# Patient Record
Sex: Female | Born: 1950
Health system: Southern US, Community
[De-identification: ages and names within clinical notes are randomized; demographics above are authoritative.]

## PROBLEM LIST (undated history)

## (undated) VITALS — BP 129/85 | HR 92 | Temp 98.1°F | Resp 18 | Ht 60.0 in | Wt 139.0 lb

## (undated) VITALS — BP 148/86 | HR 87 | Temp 97.0°F | Resp 17 | Ht 61.0 in

## (undated) DIAGNOSIS — I2510 Atherosclerotic heart disease of native coronary artery without angina pectoris: Secondary | ICD-10-CM

## (undated) DIAGNOSIS — Z720 Tobacco use: Secondary | ICD-10-CM

## (undated) DIAGNOSIS — Z8489 Family history of other specified conditions: Secondary | ICD-10-CM

## (undated) DIAGNOSIS — F329 Major depressive disorder, single episode, unspecified: Secondary | ICD-10-CM

## (undated) DIAGNOSIS — R413 Other amnesia: Secondary | ICD-10-CM

## (undated) DIAGNOSIS — F32A Depression, unspecified: Secondary | ICD-10-CM

## (undated) DIAGNOSIS — I639 Cerebral infarction, unspecified: Secondary | ICD-10-CM

## (undated) DIAGNOSIS — T8859XA Other complications of anesthesia, initial encounter: Secondary | ICD-10-CM

## (undated) DIAGNOSIS — G2401 Drug induced subacute dyskinesia: Secondary | ICD-10-CM

## (undated) DIAGNOSIS — T4145XA Adverse effect of unspecified anesthetic, initial encounter: Secondary | ICD-10-CM

## (undated) DIAGNOSIS — W19XXXA Unspecified fall, initial encounter: Secondary | ICD-10-CM

## (undated) DIAGNOSIS — R079 Chest pain, unspecified: Secondary | ICD-10-CM

## (undated) DIAGNOSIS — K219 Gastro-esophageal reflux disease without esophagitis: Secondary | ICD-10-CM

## (undated) DIAGNOSIS — F3131 Bipolar disorder, current episode depressed, mild: Secondary | ICD-10-CM

## (undated) DIAGNOSIS — I10 Essential (primary) hypertension: Secondary | ICD-10-CM

## (undated) HISTORY — PX: CARDIAC CATHETERIZATION: SHX172

## (undated) HISTORY — DX: Other amnesia: R41.3

## (undated) HISTORY — PX: ABDOMINAL HYSTERECTOMY: SHX81

## (undated) HISTORY — DX: Gastro-esophageal reflux disease without esophagitis: K21.9

---

## 1988-10-18 DIAGNOSIS — Z8672 Personal history of thrombophlebitis: Secondary | ICD-10-CM | POA: Insufficient documentation

## 1998-01-17 ENCOUNTER — Emergency Department (HOSPITAL_COMMUNITY): Admission: EM | Admit: 1998-01-17 | Discharge: 1998-01-17 | Payer: Self-pay | Admitting: Emergency Medicine

## 1998-08-27 ENCOUNTER — Emergency Department (HOSPITAL_COMMUNITY): Admission: EM | Admit: 1998-08-27 | Discharge: 1998-08-27 | Payer: Self-pay | Admitting: *Deleted

## 1998-08-27 ENCOUNTER — Encounter: Payer: Self-pay | Admitting: *Deleted

## 1998-08-28 ENCOUNTER — Emergency Department (HOSPITAL_COMMUNITY): Admission: EM | Admit: 1998-08-28 | Discharge: 1998-08-28 | Payer: Self-pay | Admitting: Emergency Medicine

## 1998-08-30 ENCOUNTER — Encounter: Payer: Self-pay | Admitting: Emergency Medicine

## 1998-08-30 ENCOUNTER — Emergency Department (HOSPITAL_COMMUNITY): Admission: EM | Admit: 1998-08-30 | Discharge: 1998-08-30 | Payer: Self-pay | Admitting: Emergency Medicine

## 1999-03-04 ENCOUNTER — Encounter: Payer: Self-pay | Admitting: Emergency Medicine

## 1999-03-04 ENCOUNTER — Emergency Department (HOSPITAL_COMMUNITY): Admission: EM | Admit: 1999-03-04 | Discharge: 1999-03-04 | Payer: Self-pay | Admitting: Emergency Medicine

## 2000-11-10 ENCOUNTER — Encounter: Payer: Self-pay | Admitting: Emergency Medicine

## 2000-11-10 ENCOUNTER — Emergency Department (HOSPITAL_COMMUNITY): Admission: EM | Admit: 2000-11-10 | Discharge: 2000-11-10 | Payer: Self-pay | Admitting: Emergency Medicine

## 2001-05-02 ENCOUNTER — Emergency Department (HOSPITAL_COMMUNITY): Admission: EM | Admit: 2001-05-02 | Discharge: 2001-05-02 | Payer: Self-pay | Admitting: Emergency Medicine

## 2001-05-02 ENCOUNTER — Encounter: Payer: Self-pay | Admitting: Emergency Medicine

## 2001-07-24 ENCOUNTER — Inpatient Hospital Stay (HOSPITAL_COMMUNITY): Admission: AD | Admit: 2001-07-24 | Discharge: 2001-07-24 | Payer: Self-pay | Admitting: Obstetrics

## 2001-07-25 ENCOUNTER — Encounter: Payer: Self-pay | Admitting: Emergency Medicine

## 2001-07-25 ENCOUNTER — Emergency Department (HOSPITAL_COMMUNITY): Admission: EM | Admit: 2001-07-25 | Discharge: 2001-07-25 | Payer: Self-pay | Admitting: Emergency Medicine

## 2002-01-08 ENCOUNTER — Encounter: Payer: Self-pay | Admitting: Emergency Medicine

## 2002-01-08 ENCOUNTER — Emergency Department (HOSPITAL_COMMUNITY): Admission: EM | Admit: 2002-01-08 | Discharge: 2002-01-08 | Payer: Self-pay | Admitting: Emergency Medicine

## 2002-05-28 ENCOUNTER — Emergency Department (HOSPITAL_COMMUNITY): Admission: EM | Admit: 2002-05-28 | Discharge: 2002-05-28 | Payer: Self-pay | Admitting: Emergency Medicine

## 2002-08-28 ENCOUNTER — Emergency Department (HOSPITAL_COMMUNITY): Admission: EM | Admit: 2002-08-28 | Discharge: 2002-08-28 | Payer: Self-pay | Admitting: Emergency Medicine

## 2002-08-28 ENCOUNTER — Encounter: Payer: Self-pay | Admitting: Emergency Medicine

## 2002-08-29 ENCOUNTER — Emergency Department (HOSPITAL_COMMUNITY): Admission: EM | Admit: 2002-08-29 | Discharge: 2002-08-29 | Payer: Self-pay | Admitting: Emergency Medicine

## 2003-04-03 ENCOUNTER — Emergency Department (HOSPITAL_COMMUNITY): Admission: EM | Admit: 2003-04-03 | Discharge: 2003-04-03 | Payer: Self-pay | Admitting: Emergency Medicine

## 2004-01-02 ENCOUNTER — Emergency Department (HOSPITAL_COMMUNITY): Admission: EM | Admit: 2004-01-02 | Discharge: 2004-01-02 | Payer: Self-pay | Admitting: Emergency Medicine

## 2004-01-04 ENCOUNTER — Emergency Department (HOSPITAL_COMMUNITY): Admission: EM | Admit: 2004-01-04 | Discharge: 2004-01-04 | Payer: Self-pay | Admitting: Emergency Medicine

## 2004-07-07 ENCOUNTER — Emergency Department (HOSPITAL_COMMUNITY): Admission: EM | Admit: 2004-07-07 | Discharge: 2004-07-07 | Payer: Self-pay

## 2004-07-10 ENCOUNTER — Emergency Department (HOSPITAL_COMMUNITY): Admission: EM | Admit: 2004-07-10 | Discharge: 2004-07-10 | Payer: Self-pay | Admitting: Emergency Medicine

## 2004-07-15 ENCOUNTER — Emergency Department (HOSPITAL_COMMUNITY): Admission: EM | Admit: 2004-07-15 | Discharge: 2004-07-15 | Payer: Self-pay | Admitting: Emergency Medicine

## 2004-12-24 ENCOUNTER — Ambulatory Visit: Payer: Self-pay | Admitting: Nurse Practitioner

## 2004-12-24 ENCOUNTER — Ambulatory Visit: Payer: Self-pay | Admitting: *Deleted

## 2004-12-30 ENCOUNTER — Ambulatory Visit (HOSPITAL_COMMUNITY): Admission: RE | Admit: 2004-12-30 | Discharge: 2004-12-30 | Payer: Self-pay | Admitting: Family Medicine

## 2005-01-13 ENCOUNTER — Ambulatory Visit: Payer: Self-pay | Admitting: Nurse Practitioner

## 2005-07-14 ENCOUNTER — Ambulatory Visit: Payer: Self-pay | Admitting: Nurse Practitioner

## 2006-03-23 ENCOUNTER — Ambulatory Visit: Payer: Self-pay | Admitting: Nurse Practitioner

## 2006-05-10 ENCOUNTER — Emergency Department (HOSPITAL_COMMUNITY): Admission: EM | Admit: 2006-05-10 | Discharge: 2006-05-10 | Payer: Self-pay | Admitting: Family Medicine

## 2006-06-07 ENCOUNTER — Emergency Department (HOSPITAL_COMMUNITY): Admission: EM | Admit: 2006-06-07 | Discharge: 2006-06-07 | Payer: Self-pay | Admitting: Emergency Medicine

## 2006-08-19 ENCOUNTER — Ambulatory Visit: Payer: Self-pay | Admitting: Internal Medicine

## 2006-10-21 ENCOUNTER — Ambulatory Visit: Payer: Self-pay | Admitting: Nurse Practitioner

## 2006-11-13 ENCOUNTER — Emergency Department (HOSPITAL_COMMUNITY): Admission: EM | Admit: 2006-11-13 | Discharge: 2006-11-13 | Payer: Self-pay | Admitting: Emergency Medicine

## 2007-01-18 ENCOUNTER — Emergency Department (HOSPITAL_COMMUNITY): Admission: EM | Admit: 2007-01-18 | Discharge: 2007-01-18 | Payer: Self-pay | Admitting: Emergency Medicine

## 2007-02-06 ENCOUNTER — Ambulatory Visit: Payer: Self-pay | Admitting: Nurse Practitioner

## 2007-02-06 ENCOUNTER — Encounter (INDEPENDENT_AMBULATORY_CARE_PROVIDER_SITE_OTHER): Payer: Self-pay | Admitting: Nurse Practitioner

## 2007-02-15 ENCOUNTER — Ambulatory Visit: Payer: Self-pay | Admitting: Nurse Practitioner

## 2007-02-21 ENCOUNTER — Ambulatory Visit (HOSPITAL_COMMUNITY): Admission: RE | Admit: 2007-02-21 | Discharge: 2007-02-21 | Payer: Self-pay | Admitting: Internal Medicine

## 2007-05-29 ENCOUNTER — Emergency Department (HOSPITAL_COMMUNITY): Admission: EM | Admit: 2007-05-29 | Discharge: 2007-05-29 | Payer: Self-pay | Admitting: Emergency Medicine

## 2007-06-07 ENCOUNTER — Ambulatory Visit: Payer: Self-pay | Admitting: Family Medicine

## 2007-06-28 ENCOUNTER — Encounter (INDEPENDENT_AMBULATORY_CARE_PROVIDER_SITE_OTHER): Payer: Self-pay | Admitting: Nurse Practitioner

## 2007-06-28 ENCOUNTER — Ambulatory Visit: Payer: Self-pay | Admitting: Internal Medicine

## 2007-06-28 LAB — CONVERTED CEMR LAB
ALT: 13 units/L (ref 0–35)
AST: 15 units/L (ref 0–37)
Albumin: 4.1 g/dL (ref 3.5–5.2)
Alkaline Phosphatase: 53 units/L (ref 39–117)
Bilirubin, Direct: 0.1 mg/dL (ref 0.0–0.3)
Cholesterol: 205 mg/dL — ABNORMAL HIGH (ref 0–200)
HDL: 68 mg/dL (ref >39)
Indirect Bilirubin: 0.3 mg/dL (ref 0.0–0.9)
LDL Cholesterol: 75 mg/dL (ref 0–99)
TSH: 3.201 microintl units/mL (ref 0.350–5.50)
Total Bilirubin: 0.4 mg/dL (ref 0.3–1.2)
Total CHOL/HDL Ratio: 3
Total Protein: 7.7 g/dL (ref 6.0–8.3)
Triglycerides: 309 mg/dL — ABNORMAL HIGH (ref <150)
VLDL: 62 mg/dL — ABNORMAL HIGH (ref 0–40)

## 2007-07-05 ENCOUNTER — Encounter (INDEPENDENT_AMBULATORY_CARE_PROVIDER_SITE_OTHER): Payer: Self-pay | Admitting: *Deleted

## 2007-08-24 ENCOUNTER — Emergency Department (HOSPITAL_COMMUNITY): Admission: EM | Admit: 2007-08-24 | Discharge: 2007-08-24 | Payer: Self-pay | Admitting: Emergency Medicine

## 2007-09-18 ENCOUNTER — Ambulatory Visit: Payer: Self-pay | Admitting: Internal Medicine

## 2007-11-15 ENCOUNTER — Inpatient Hospital Stay (HOSPITAL_COMMUNITY): Admission: EM | Admit: 2007-11-15 | Discharge: 2007-11-18 | Payer: Self-pay | Admitting: Emergency Medicine

## 2007-11-15 ENCOUNTER — Ambulatory Visit: Payer: Self-pay | Admitting: Family Medicine

## 2007-11-16 ENCOUNTER — Encounter: Payer: Self-pay | Admitting: Family Medicine

## 2007-12-15 ENCOUNTER — Ambulatory Visit: Payer: Self-pay | Admitting: Family Medicine

## 2008-01-14 ENCOUNTER — Emergency Department (HOSPITAL_COMMUNITY): Admission: EM | Admit: 2008-01-14 | Discharge: 2008-01-14 | Payer: Self-pay | Admitting: Emergency Medicine

## 2008-01-19 ENCOUNTER — Ambulatory Visit: Payer: Self-pay | Admitting: Internal Medicine

## 2008-03-14 ENCOUNTER — Emergency Department (HOSPITAL_COMMUNITY): Admission: EM | Admit: 2008-03-14 | Discharge: 2008-03-14 | Payer: Self-pay | Admitting: Emergency Medicine

## 2008-03-27 ENCOUNTER — Emergency Department (HOSPITAL_COMMUNITY): Admission: EM | Admit: 2008-03-27 | Discharge: 2008-03-27 | Payer: Self-pay | Admitting: Emergency Medicine

## 2008-05-13 ENCOUNTER — Encounter (INDEPENDENT_AMBULATORY_CARE_PROVIDER_SITE_OTHER): Payer: Self-pay | Admitting: Internal Medicine

## 2008-05-15 ENCOUNTER — Telehealth (INDEPENDENT_AMBULATORY_CARE_PROVIDER_SITE_OTHER): Payer: Self-pay | Admitting: Internal Medicine

## 2008-05-24 ENCOUNTER — Ambulatory Visit: Payer: Self-pay | Admitting: Nurse Practitioner

## 2008-05-24 DIAGNOSIS — N760 Acute vaginitis: Secondary | ICD-10-CM | POA: Insufficient documentation

## 2008-05-24 LAB — CONVERTED CEMR LAB
Bilirubin Urine: NEGATIVE
Blood in Urine, dipstick: NEGATIVE
Chlamydia, Swab/Urine, PCR: NEGATIVE
GC Probe Amp, Urine: NEGATIVE
Glucose, Urine, Semiquant: NEGATIVE
KOH Prep: NEGATIVE
Ketones, urine, test strip: NEGATIVE
Nitrite: NEGATIVE
Protein, U semiquant: NEGATIVE
Specific Gravity, Urine: 1.02
Urobilinogen, UA: 0.2
WBC Urine, dipstick: NEGATIVE
pH: 6

## 2008-05-28 ENCOUNTER — Ambulatory Visit: Payer: Self-pay | Admitting: Family Medicine

## 2008-05-28 DIAGNOSIS — K219 Gastro-esophageal reflux disease without esophagitis: Secondary | ICD-10-CM | POA: Insufficient documentation

## 2008-05-28 DIAGNOSIS — F411 Generalized anxiety disorder: Secondary | ICD-10-CM | POA: Insufficient documentation

## 2008-05-28 DIAGNOSIS — I10 Essential (primary) hypertension: Secondary | ICD-10-CM | POA: Insufficient documentation

## 2008-05-28 LAB — CONVERTED CEMR LAB
Amphetamine Screen, Ur: NEGATIVE
Barbiturate Quant, Ur: NEGATIVE
Benzodiazepines.: NEGATIVE
Cocaine Metabolites: NEGATIVE
Creatinine,U: 166.9 mg/dL
Marijuana Metabolite: NEGATIVE
Methadone: NEGATIVE
Opiate Screen, Urine: NEGATIVE
Phencyclidine (PCP): NEGATIVE
Propoxyphene: NEGATIVE

## 2008-06-20 ENCOUNTER — Ambulatory Visit: Payer: Self-pay | Admitting: Internal Medicine

## 2008-06-20 DIAGNOSIS — F329 Major depressive disorder, single episode, unspecified: Secondary | ICD-10-CM | POA: Insufficient documentation

## 2008-06-20 DIAGNOSIS — J069 Acute upper respiratory infection, unspecified: Secondary | ICD-10-CM | POA: Insufficient documentation

## 2008-08-01 ENCOUNTER — Ambulatory Visit: Payer: Self-pay | Admitting: Internal Medicine

## 2008-08-01 DIAGNOSIS — J45909 Unspecified asthma, uncomplicated: Secondary | ICD-10-CM | POA: Insufficient documentation

## 2008-08-01 DIAGNOSIS — G43909 Migraine, unspecified, not intractable, without status migrainosus: Secondary | ICD-10-CM | POA: Insufficient documentation

## 2008-08-01 DIAGNOSIS — J449 Chronic obstructive pulmonary disease, unspecified: Secondary | ICD-10-CM | POA: Insufficient documentation

## 2008-08-01 DIAGNOSIS — E785 Hyperlipidemia, unspecified: Secondary | ICD-10-CM | POA: Insufficient documentation

## 2008-08-01 DIAGNOSIS — J309 Allergic rhinitis, unspecified: Secondary | ICD-10-CM | POA: Insufficient documentation

## 2008-08-25 ENCOUNTER — Emergency Department (HOSPITAL_COMMUNITY): Admission: EM | Admit: 2008-08-25 | Discharge: 2008-08-25 | Payer: Self-pay | Admitting: Emergency Medicine

## 2008-08-30 ENCOUNTER — Ambulatory Visit: Payer: Self-pay | Admitting: Internal Medicine

## 2008-09-02 LAB — CONVERTED CEMR LAB
Amphetamine Screen, Ur: NEGATIVE
Barbiturate Quant, Ur: NEGATIVE
Benzodiazepines.: NEGATIVE
Cocaine Metabolites: NEGATIVE
Creatinine,U: 141.5 mg/dL
Marijuana Metabolite: NEGATIVE
Methadone: NEGATIVE
Opiate Screen, Urine: NEGATIVE
Phencyclidine (PCP): NEGATIVE
Propoxyphene: NEGATIVE

## 2008-09-20 ENCOUNTER — Ambulatory Visit: Payer: Self-pay | Admitting: Internal Medicine

## 2008-09-25 ENCOUNTER — Emergency Department (HOSPITAL_COMMUNITY): Admission: EM | Admit: 2008-09-25 | Discharge: 2008-09-25 | Payer: Self-pay | Admitting: Emergency Medicine

## 2008-09-30 ENCOUNTER — Emergency Department (HOSPITAL_COMMUNITY): Admission: EM | Admit: 2008-09-30 | Discharge: 2008-09-30 | Payer: Self-pay | Admitting: Emergency Medicine

## 2008-10-04 ENCOUNTER — Encounter (INDEPENDENT_AMBULATORY_CARE_PROVIDER_SITE_OTHER): Payer: Self-pay | Admitting: Internal Medicine

## 2008-10-14 ENCOUNTER — Telehealth (INDEPENDENT_AMBULATORY_CARE_PROVIDER_SITE_OTHER): Payer: Self-pay | Admitting: Internal Medicine

## 2008-10-25 ENCOUNTER — Ambulatory Visit: Payer: Self-pay | Admitting: Internal Medicine

## 2008-10-25 LAB — CONVERTED CEMR LAB
ALT: 17 units/L (ref 0–35)
AST: 20 units/L (ref 0–37)
Albumin: 4.5 g/dL (ref 3.5–5.2)
Alkaline Phosphatase: 57 units/L (ref 39–117)
BUN: 21 mg/dL (ref 6–23)
Basophils Absolute: 0 10*3/uL (ref 0.0–0.1)
Basophils Relative: 1 % (ref 0–1)
Bilirubin Urine: NEGATIVE
Blood in Urine, dipstick: NEGATIVE
CO2: 21 meq/L (ref 19–32)
Calcium: 9.5 mg/dL (ref 8.4–10.5)
Chloride: 98 meq/L (ref 96–112)
Creatinine, Ser: 0.92 mg/dL (ref 0.40–1.20)
Eosinophils Absolute: 0.1 10*3/uL (ref 0.0–0.7)
Eosinophils Relative: 2 % (ref 0–5)
Glucose, Bld: 83 mg/dL (ref 70–99)
Glucose, Urine, Semiquant: NEGATIVE
HCT: 41.3 % (ref 36.0–46.0)
Hemoglobin: 13.7 g/dL (ref 12.0–15.0)
Ketones, urine, test strip: NEGATIVE
Lymphocytes Relative: 51 % — ABNORMAL HIGH (ref 12–46)
Lymphs Abs: 2.9 10*3/uL (ref 0.7–4.0)
MCHC: 33.2 g/dL (ref 30.0–36.0)
MCV: 91.2 fL (ref 78.0–100.0)
Monocytes Absolute: 0.4 10*3/uL (ref 0.1–1.0)
Monocytes Relative: 7 % (ref 3–12)
Neutro Abs: 2.3 10*3/uL (ref 1.7–7.7)
Neutrophils Relative %: 40 % — ABNORMAL LOW (ref 43–77)
Nitrite: NEGATIVE
Platelets: 332 10*3/uL (ref 150–400)
Potassium: 3.7 meq/L (ref 3.5–5.3)
Protein, U semiquant: NEGATIVE
RBC: 4.53 M/uL (ref 3.87–5.11)
RDW: 13.7 % (ref 11.5–15.5)
Sodium: 135 meq/L (ref 135–145)
Specific Gravity, Urine: 1.015
Total Bilirubin: 0.4 mg/dL (ref 0.3–1.2)
Total Protein: 8 g/dL (ref 6.0–8.3)
Urobilinogen, UA: 0.2
WBC Urine, dipstick: NEGATIVE
WBC: 5.7 10*3/uL (ref 4.0–10.5)
pH: 7

## 2008-10-28 ENCOUNTER — Telehealth (INDEPENDENT_AMBULATORY_CARE_PROVIDER_SITE_OTHER): Payer: Self-pay | Admitting: Internal Medicine

## 2008-11-09 ENCOUNTER — Emergency Department (HOSPITAL_COMMUNITY): Admission: EM | Admit: 2008-11-09 | Discharge: 2008-11-09 | Payer: Self-pay | Admitting: Emergency Medicine

## 2008-11-19 ENCOUNTER — Emergency Department (HOSPITAL_COMMUNITY): Admission: EM | Admit: 2008-11-19 | Discharge: 2008-11-19 | Payer: Self-pay | Admitting: Emergency Medicine

## 2008-11-21 ENCOUNTER — Ambulatory Visit (HOSPITAL_COMMUNITY): Admission: RE | Admit: 2008-11-21 | Discharge: 2008-11-21 | Payer: Self-pay | Admitting: Internal Medicine

## 2008-12-02 ENCOUNTER — Encounter (INDEPENDENT_AMBULATORY_CARE_PROVIDER_SITE_OTHER): Payer: Self-pay | Admitting: Internal Medicine

## 2008-12-02 ENCOUNTER — Emergency Department (HOSPITAL_COMMUNITY): Admission: EM | Admit: 2008-12-02 | Discharge: 2008-12-02 | Payer: Self-pay | Admitting: Emergency Medicine

## 2008-12-03 ENCOUNTER — Ambulatory Visit: Payer: Self-pay | Admitting: Internal Medicine

## 2008-12-03 DIAGNOSIS — F1910 Other psychoactive substance abuse, uncomplicated: Secondary | ICD-10-CM | POA: Insufficient documentation

## 2008-12-15 ENCOUNTER — Emergency Department (HOSPITAL_COMMUNITY): Admission: EM | Admit: 2008-12-15 | Discharge: 2008-12-15 | Payer: Self-pay | Admitting: Emergency Medicine

## 2008-12-16 ENCOUNTER — Telehealth (INDEPENDENT_AMBULATORY_CARE_PROVIDER_SITE_OTHER): Payer: Self-pay | Admitting: Internal Medicine

## 2009-01-19 ENCOUNTER — Emergency Department (HOSPITAL_COMMUNITY): Admission: EM | Admit: 2009-01-19 | Discharge: 2009-01-19 | Payer: Self-pay | Admitting: Emergency Medicine

## 2009-01-20 ENCOUNTER — Emergency Department (HOSPITAL_COMMUNITY): Admission: EM | Admit: 2009-01-20 | Discharge: 2009-01-20 | Payer: Self-pay | Admitting: *Deleted

## 2009-03-05 ENCOUNTER — Emergency Department (HOSPITAL_COMMUNITY): Admission: EM | Admit: 2009-03-05 | Discharge: 2009-03-06 | Payer: Self-pay | Admitting: Emergency Medicine

## 2009-03-07 ENCOUNTER — Emergency Department (HOSPITAL_BASED_OUTPATIENT_CLINIC_OR_DEPARTMENT_OTHER): Admission: EM | Admit: 2009-03-07 | Discharge: 2009-03-07 | Payer: Self-pay | Admitting: Emergency Medicine

## 2009-03-07 ENCOUNTER — Ambulatory Visit: Payer: Self-pay | Admitting: Diagnostic Radiology

## 2009-03-09 ENCOUNTER — Emergency Department (HOSPITAL_BASED_OUTPATIENT_CLINIC_OR_DEPARTMENT_OTHER): Admission: EM | Admit: 2009-03-09 | Discharge: 2009-03-09 | Payer: Self-pay | Admitting: Emergency Medicine

## 2009-03-22 ENCOUNTER — Emergency Department (HOSPITAL_COMMUNITY): Admission: EM | Admit: 2009-03-22 | Discharge: 2009-03-22 | Payer: Self-pay | Admitting: Emergency Medicine

## 2009-03-29 ENCOUNTER — Emergency Department (HOSPITAL_COMMUNITY): Admission: EM | Admit: 2009-03-29 | Discharge: 2009-03-29 | Payer: Self-pay | Admitting: Emergency Medicine

## 2009-04-02 ENCOUNTER — Emergency Department (HOSPITAL_COMMUNITY): Admission: EM | Admit: 2009-04-02 | Discharge: 2009-04-03 | Payer: Self-pay | Admitting: Emergency Medicine

## 2009-04-03 ENCOUNTER — Encounter (INDEPENDENT_AMBULATORY_CARE_PROVIDER_SITE_OTHER): Payer: Self-pay | Admitting: Internal Medicine

## 2009-04-04 ENCOUNTER — Emergency Department: Payer: Self-pay | Admitting: Emergency Medicine

## 2009-04-06 ENCOUNTER — Emergency Department: Payer: Self-pay | Admitting: Unknown Physician Specialty

## 2009-04-11 ENCOUNTER — Telehealth (INDEPENDENT_AMBULATORY_CARE_PROVIDER_SITE_OTHER): Payer: Self-pay | Admitting: Internal Medicine

## 2009-04-11 ENCOUNTER — Encounter (INDEPENDENT_AMBULATORY_CARE_PROVIDER_SITE_OTHER): Payer: Self-pay | Admitting: Internal Medicine

## 2009-04-12 ENCOUNTER — Inpatient Hospital Stay: Payer: Self-pay | Admitting: Unknown Physician Specialty

## 2009-04-27 ENCOUNTER — Emergency Department (HOSPITAL_COMMUNITY): Admission: EM | Admit: 2009-04-27 | Discharge: 2009-04-27 | Payer: Self-pay | Admitting: Emergency Medicine

## 2009-05-02 ENCOUNTER — Emergency Department (HOSPITAL_COMMUNITY): Admission: EM | Admit: 2009-05-02 | Discharge: 2009-05-02 | Payer: Self-pay | Admitting: Emergency Medicine

## 2009-05-12 ENCOUNTER — Encounter (INDEPENDENT_AMBULATORY_CARE_PROVIDER_SITE_OTHER): Payer: Self-pay | Admitting: Internal Medicine

## 2009-05-16 ENCOUNTER — Emergency Department: Payer: Self-pay | Admitting: Emergency Medicine

## 2009-05-21 ENCOUNTER — Emergency Department (HOSPITAL_COMMUNITY): Admission: EM | Admit: 2009-05-21 | Discharge: 2009-05-21 | Payer: Self-pay | Admitting: Emergency Medicine

## 2009-06-04 ENCOUNTER — Emergency Department (HOSPITAL_COMMUNITY): Admission: EM | Admit: 2009-06-04 | Discharge: 2009-06-04 | Payer: Self-pay | Admitting: Emergency Medicine

## 2009-06-15 ENCOUNTER — Emergency Department (HOSPITAL_COMMUNITY): Admission: EM | Admit: 2009-06-15 | Discharge: 2009-06-15 | Payer: Self-pay | Admitting: Emergency Medicine

## 2009-06-30 ENCOUNTER — Emergency Department (HOSPITAL_COMMUNITY): Admission: EM | Admit: 2009-06-30 | Discharge: 2009-06-30 | Payer: Self-pay | Admitting: Emergency Medicine

## 2009-08-06 ENCOUNTER — Emergency Department (HOSPITAL_COMMUNITY): Admission: EM | Admit: 2009-08-06 | Discharge: 2009-08-06 | Payer: Self-pay | Admitting: Emergency Medicine

## 2009-08-17 ENCOUNTER — Emergency Department (HOSPITAL_COMMUNITY): Admission: EM | Admit: 2009-08-17 | Discharge: 2009-08-17 | Payer: Self-pay | Admitting: Emergency Medicine

## 2009-08-30 ENCOUNTER — Emergency Department (HOSPITAL_COMMUNITY): Admission: EM | Admit: 2009-08-30 | Discharge: 2009-08-30 | Payer: Self-pay | Admitting: Emergency Medicine

## 2009-10-18 ENCOUNTER — Inpatient Hospital Stay (HOSPITAL_COMMUNITY): Admission: EM | Admit: 2009-10-18 | Discharge: 2009-10-21 | Payer: Self-pay | Admitting: Emergency Medicine

## 2010-01-01 ENCOUNTER — Emergency Department (HOSPITAL_COMMUNITY): Admission: EM | Admit: 2010-01-01 | Discharge: 2010-01-01 | Payer: Self-pay | Admitting: Emergency Medicine

## 2010-01-08 ENCOUNTER — Telehealth (INDEPENDENT_AMBULATORY_CARE_PROVIDER_SITE_OTHER): Payer: Self-pay | Admitting: Internal Medicine

## 2010-01-08 ENCOUNTER — Emergency Department (HOSPITAL_COMMUNITY): Admission: EM | Admit: 2010-01-08 | Discharge: 2010-01-08 | Payer: Self-pay | Admitting: Emergency Medicine

## 2010-02-16 ENCOUNTER — Emergency Department (HOSPITAL_COMMUNITY): Admission: EM | Admit: 2010-02-16 | Discharge: 2010-02-16 | Payer: Self-pay | Admitting: Emergency Medicine

## 2010-02-28 ENCOUNTER — Emergency Department (HOSPITAL_COMMUNITY): Admission: EM | Admit: 2010-02-28 | Discharge: 2010-02-28 | Payer: Self-pay | Admitting: Emergency Medicine

## 2010-03-02 ENCOUNTER — Emergency Department (HOSPITAL_COMMUNITY): Admission: EM | Admit: 2010-03-02 | Discharge: 2010-03-02 | Payer: Self-pay | Admitting: Emergency Medicine

## 2010-03-03 ENCOUNTER — Emergency Department (HOSPITAL_COMMUNITY): Admission: EM | Admit: 2010-03-03 | Discharge: 2010-03-03 | Payer: Self-pay | Admitting: Emergency Medicine

## 2010-03-12 ENCOUNTER — Emergency Department (HOSPITAL_COMMUNITY): Admission: EM | Admit: 2010-03-12 | Discharge: 2010-03-14 | Payer: Self-pay | Admitting: Emergency Medicine

## 2010-04-03 ENCOUNTER — Inpatient Hospital Stay (HOSPITAL_COMMUNITY): Admission: EM | Admit: 2010-04-03 | Discharge: 2010-04-07 | Payer: Self-pay | Admitting: Emergency Medicine

## 2010-04-03 ENCOUNTER — Ambulatory Visit: Payer: Self-pay | Admitting: Internal Medicine

## 2010-04-04 ENCOUNTER — Encounter (INDEPENDENT_AMBULATORY_CARE_PROVIDER_SITE_OTHER): Payer: Self-pay | Admitting: Family Medicine

## 2010-04-14 ENCOUNTER — Emergency Department (HOSPITAL_COMMUNITY): Admission: EM | Admit: 2010-04-14 | Discharge: 2010-04-14 | Payer: Self-pay | Admitting: Emergency Medicine

## 2010-04-15 ENCOUNTER — Emergency Department (HOSPITAL_COMMUNITY): Admission: EM | Admit: 2010-04-15 | Discharge: 2010-04-15 | Payer: Self-pay | Admitting: Emergency Medicine

## 2010-04-30 ENCOUNTER — Emergency Department (HOSPITAL_COMMUNITY): Admission: EM | Admit: 2010-04-30 | Discharge: 2010-04-30 | Payer: Self-pay | Admitting: Emergency Medicine

## 2010-05-04 ENCOUNTER — Emergency Department (HOSPITAL_COMMUNITY): Admission: EM | Admit: 2010-05-04 | Discharge: 2010-05-04 | Payer: Self-pay | Admitting: Emergency Medicine

## 2010-05-14 ENCOUNTER — Emergency Department (HOSPITAL_COMMUNITY): Admission: EM | Admit: 2010-05-14 | Discharge: 2010-05-15 | Payer: Self-pay | Admitting: Emergency Medicine

## 2010-05-17 ENCOUNTER — Emergency Department (HOSPITAL_BASED_OUTPATIENT_CLINIC_OR_DEPARTMENT_OTHER): Admission: EM | Admit: 2010-05-17 | Discharge: 2010-05-17 | Payer: Self-pay | Admitting: Emergency Medicine

## 2010-05-17 ENCOUNTER — Ambulatory Visit: Payer: Self-pay | Admitting: Interventional Radiology

## 2010-05-19 ENCOUNTER — Emergency Department (HOSPITAL_COMMUNITY): Admission: EM | Admit: 2010-05-19 | Discharge: 2010-05-20 | Payer: Self-pay | Admitting: Emergency Medicine

## 2010-05-20 ENCOUNTER — Ambulatory Visit: Payer: Self-pay | Admitting: Psychiatry

## 2010-06-26 ENCOUNTER — Emergency Department (HOSPITAL_BASED_OUTPATIENT_CLINIC_OR_DEPARTMENT_OTHER): Admission: EM | Admit: 2010-06-26 | Discharge: 2010-06-26 | Payer: Self-pay | Admitting: Emergency Medicine

## 2010-08-23 IMAGING — CR DG CHEST 2V
2 series · 2 of 2 positions shown · non-contrast
Comparison: 04/03/2010

CLINICAL DATA: Chest pain, shortness of breath

CHEST - 2 VIEW

[w chest pa]
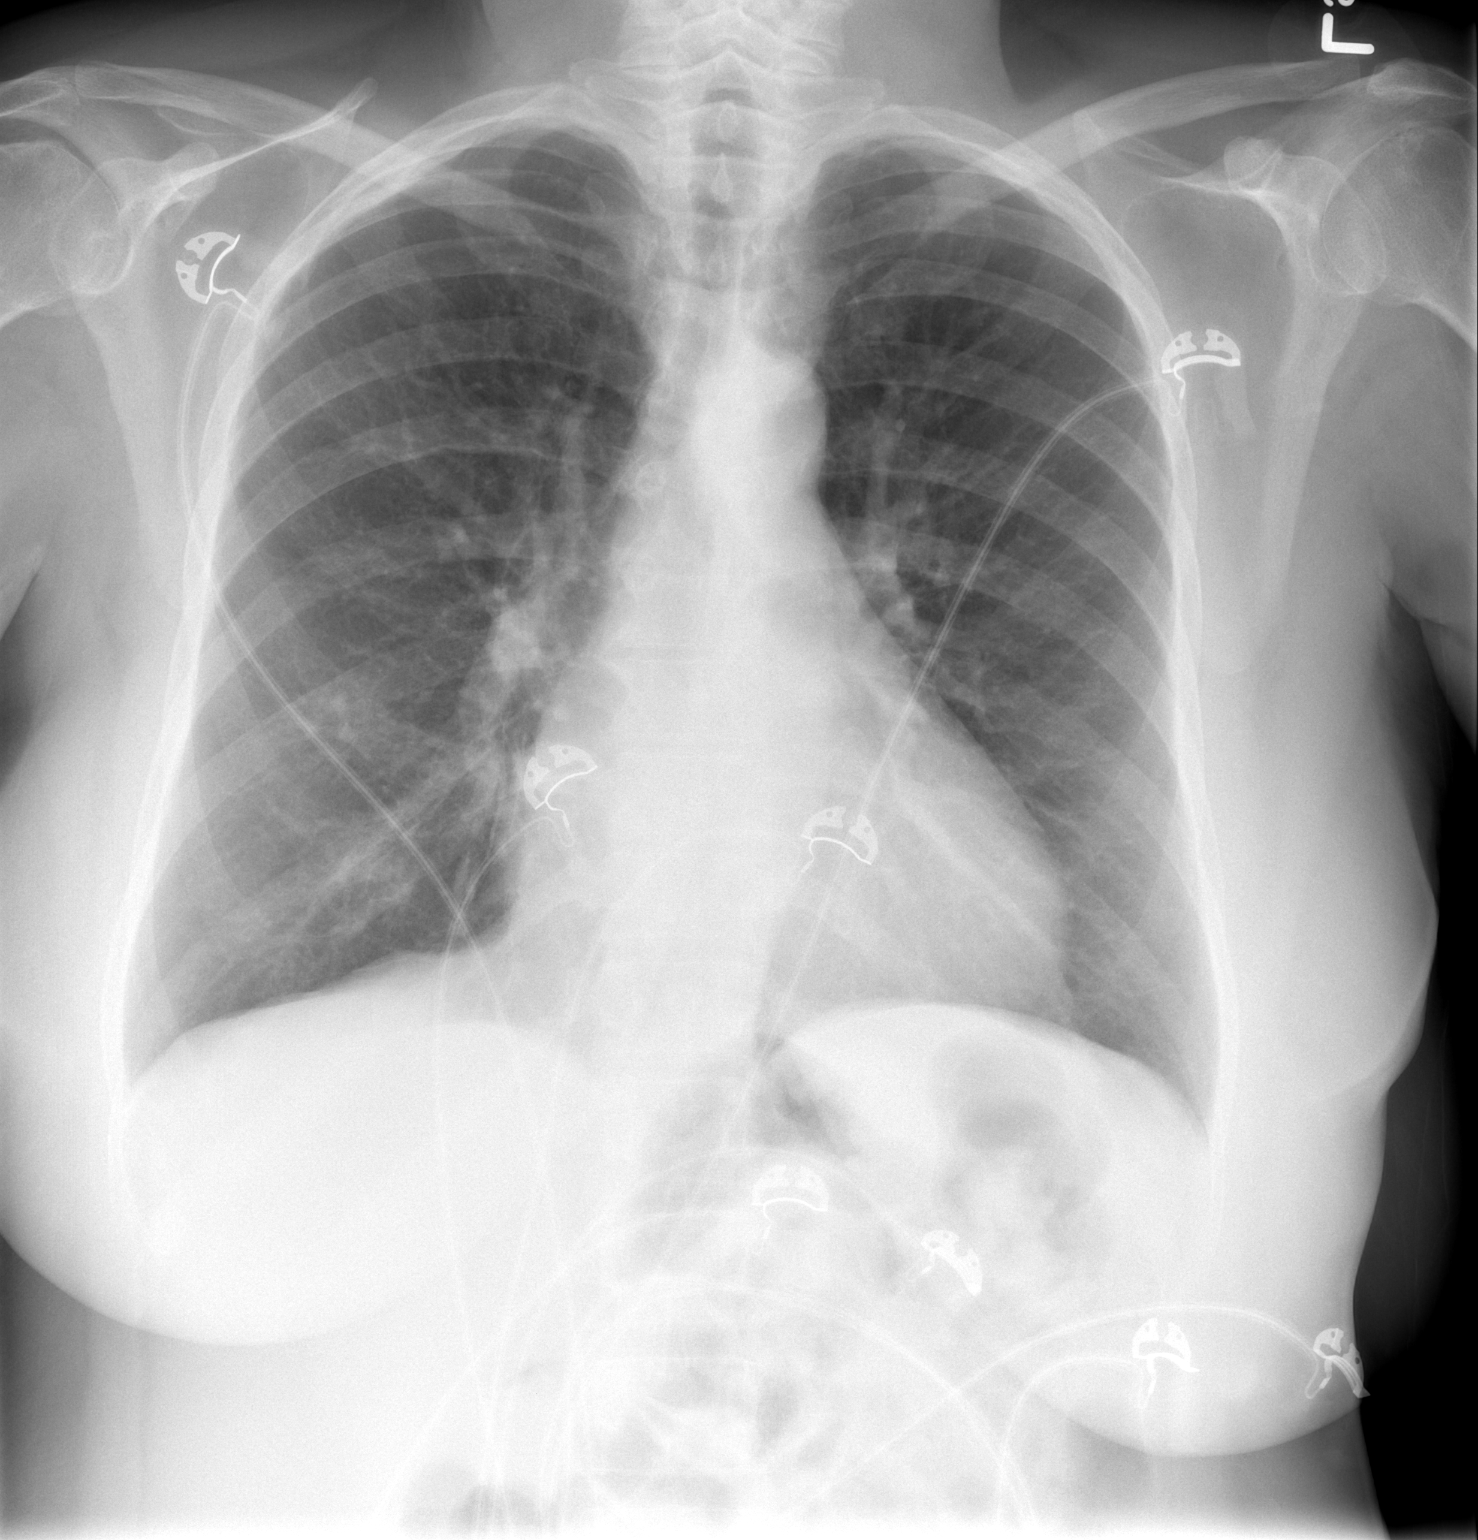

[w chest lat]
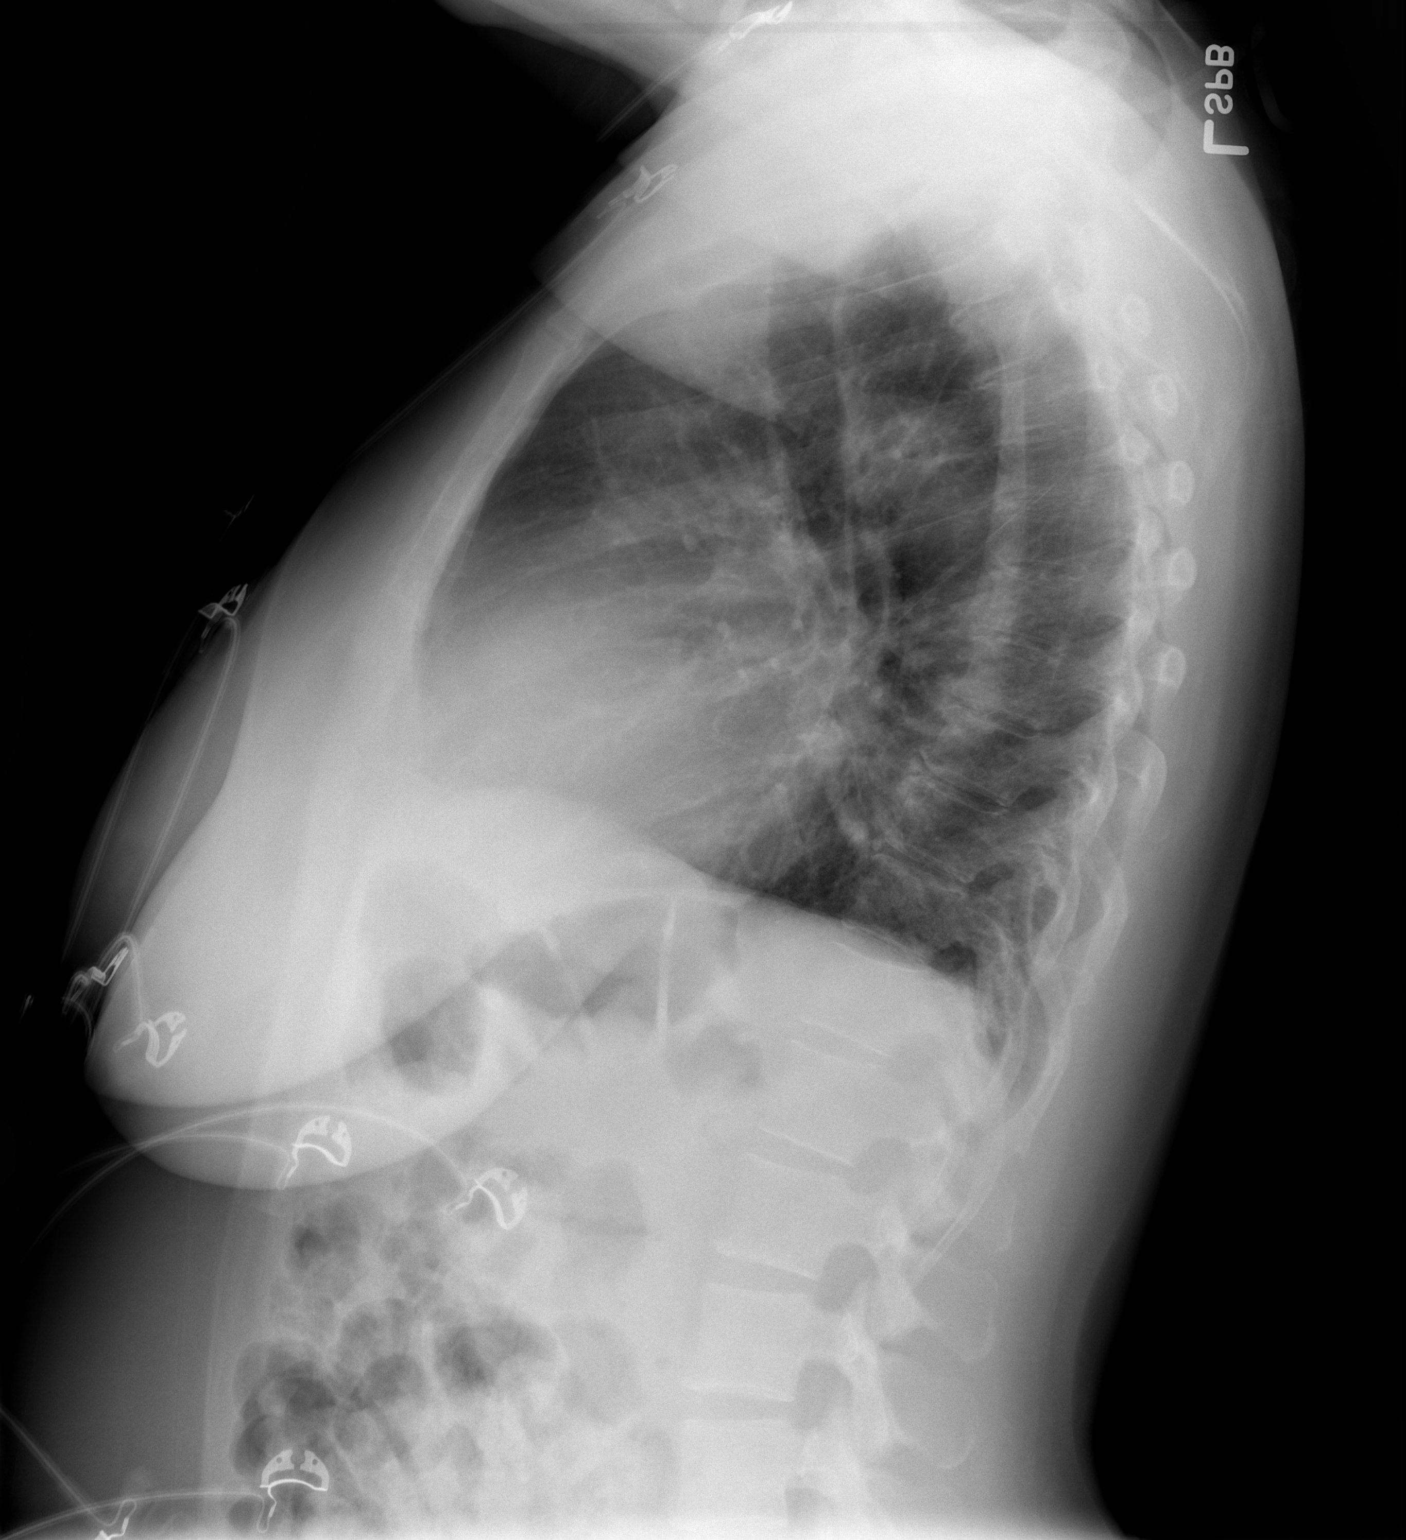

[2 of 2 positions shown; findings below may reference images not displayed]

FINDINGS: Mild cardiomegaly stable.  Lungs are clear.  No effusion.
Minimal spurring in the thoracic spine.
IMPRESSION: Stable mild cardiomegaly.

## 2010-10-09 ENCOUNTER — Emergency Department (HOSPITAL_COMMUNITY)
Admission: EM | Admit: 2010-10-09 | Discharge: 2010-10-09 | Payer: Self-pay | Source: Home / Self Care | Admitting: Family Medicine

## 2010-11-08 ENCOUNTER — Encounter: Payer: Self-pay | Admitting: Internal Medicine

## 2010-11-11 ENCOUNTER — Emergency Department (HOSPITAL_COMMUNITY)
Admission: EM | Admit: 2010-11-11 | Discharge: 2010-11-14 | Payer: Self-pay | Source: Home / Self Care | Admitting: Emergency Medicine

## 2010-11-11 LAB — RAPID URINE DRUG SCREEN, HOSP PERFORMED
Amphetamines: NOT DETECTED
Barbiturates: NOT DETECTED
Benzodiazepines: POSITIVE — AB
Cocaine: POSITIVE — AB
Opiates: NOT DETECTED
Tetrahydrocannabinol: NOT DETECTED

## 2010-11-11 LAB — COMPREHENSIVE METABOLIC PANEL
ALT: 18 U/L (ref 0–35)
AST: 27 U/L (ref 0–37)
Albumin: 3.5 g/dL (ref 3.5–5.2)
Alkaline Phosphatase: 75 U/L (ref 39–117)
BUN: 26 mg/dL — ABNORMAL HIGH (ref 6–23)
CO2: 26 mEq/L (ref 19–32)
Calcium: 9.5 mg/dL (ref 8.4–10.5)
Chloride: 102 mEq/L (ref 96–112)
Creatinine, Ser: 1.37 mg/dL — ABNORMAL HIGH (ref 0.4–1.2)
GFR calc Af Amer: 48 mL/min — ABNORMAL LOW (ref >60)
GFR calc non Af Amer: 40 mL/min — ABNORMAL LOW (ref >60)
Glucose, Bld: 101 mg/dL — ABNORMAL HIGH (ref 70–99)
Potassium: 3 mEq/L — ABNORMAL LOW (ref 3.5–5.1)
Sodium: 139 mEq/L (ref 135–145)
Total Bilirubin: 0.3 mg/dL (ref 0.3–1.2)
Total Protein: 7.1 g/dL (ref 6.0–8.3)

## 2010-11-11 LAB — POCT CARDIAC MARKERS
CKMB, poc: 2.8 ng/mL (ref 1.0–8.0)
Myoglobin, poc: 110 ng/mL (ref 12–200)
Troponin i, poc: <0.05 ng/mL (ref 0.00–0.09)

## 2010-11-11 LAB — DIFFERENTIAL
Basophils Absolute: 0.1 10*3/uL (ref 0.0–0.1)
Basophils Relative: 1 % (ref 0–1)
Eosinophils Absolute: 0.1 10*3/uL (ref 0.0–0.7)
Eosinophils Relative: 2 % (ref 0–5)
Lymphocytes Relative: 41 % (ref 12–46)
Lymphs Abs: 2.4 10*3/uL (ref 0.7–4.0)
Monocytes Absolute: 1.7 10*3/uL — ABNORMAL HIGH (ref 0.1–1.0)
Monocytes Relative: 29 % — ABNORMAL HIGH (ref 3–12)
Neutro Abs: 1.6 10*3/uL — ABNORMAL LOW (ref 1.7–7.7)
Neutrophils Relative %: 27 % — ABNORMAL LOW (ref 43–77)

## 2010-11-11 LAB — CBC
HCT: 37.5 % (ref 36.0–46.0)
Hemoglobin: 13 g/dL (ref 12.0–15.0)
MCH: 28.8 pg (ref 26.0–34.0)
MCHC: 34.7 g/dL (ref 30.0–36.0)
MCV: 83 fL (ref 78.0–100.0)
Platelets: 239 10*3/uL (ref 150–400)
RBC: 4.52 MIL/uL (ref 3.87–5.11)
RDW: 20 % — ABNORMAL HIGH (ref 11.5–15.5)
WBC: 5.9 10*3/uL (ref 4.0–10.5)

## 2010-11-11 LAB — ETHANOL: Alcohol, Ethyl (B): <5 mg/dL (ref 0–10)

## 2010-11-12 LAB — WET PREP, GENITAL
Trich, Wet Prep: NONE SEEN
Yeast Wet Prep HPF POC: NONE SEEN

## 2010-11-13 LAB — GC/CHLAMYDIA PROBE AMP, GENITAL
Chlamydia, DNA Probe: NEGATIVE
GC Probe Amp, Genital: NEGATIVE

## 2010-11-14 DIAGNOSIS — F1420 Cocaine dependence, uncomplicated: Secondary | ICD-10-CM
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
## 2010-11-17 NOTE — Progress Notes (Signed)
Summary: Medical refills  Phone Note Call from Patient Call back at Home Phone [phone]   Caller: Patient Summary of Call: The pt went to [facility] because she is out from all these medications including: (advair, celexa, lipitor, protonix, calan and premarin) so far they only refill a new blood pressure medication that she never had before called "diovan".  Pt is unsure if she should use this new medication or not but according to her they don't have her current blood pressure medication in the pharmacy.  Please call her back if you have ay questions.  Dr. Delrae Alfred Initial call taken by: Manon Hilding,  October 14, 2008 10:56 AM  Follow-up for Phone Call        Pt requesting refill on meds.    Follow-up by: Vesta Mixer CMA,  October 15, 2008 4:55 PM  Additional Follow-up for Phone Call Additional follow up Details #1::        Patient needs appt. with me to discuss her health issues besides panic attacks in next 2 months to continue meds. She needs a mammogram scheduled to continue Premarin. She needs fasting labs--CBC, CMET, UA, FLP.   Please get a list of meds from [facility] pharmacy as.  Please place on my desk. Also, pt. has several refills left on some of meds she's requested--please see med list. Additional Follow-up by: Julieanne Manson MD,  October 17, 2008 2:35 PM    Additional Follow-up for Phone Call Additional follow up Details #2::    Left message on answering machine for pt to return call  Follow-up by: Vesta Mixer CMA,  October 21, 2008 2:44 PM  Additional Follow-up for Phone Call Additional follow up Details #3:: Details for Additional Follow-up Action Taken: Pt has appt this coming Friday.  She  will come fasting fot the appt. and will need mammogram scheduled. Additional Follow-up by: Vesta Mixer CMA,  October 21, 2008 4:06 PM    Prescriptions: TRIAMTERENE-HCTZ 75-50 MG TABS (TRIAMTERENE-HCTZ) TAKE ONE (1) TABLET BY MOUTH EVERY DAY   #30 x 2   Entered and Authorized by:   Julieanne Manson MD   Signed by:   Julieanne Manson MD on 10/17/2008   Method used:   Printed then faxed to ...       [facility] - [facility] (retail)       [address] [place], [place]  [address]       Ph: [phone]       Fax: [phone]   RxID:   [id number] PROTONIX 40 MG TBEC (PANTOPRAZOLE SODIUM) TAKE ONE (1) TABLET EACH DAY   GENE ICP PROTONIX 40MG  08/08  #30 x 2   Entered and Authorized by:   Julieanne Manson MD   Signed by:   Julieanne Manson MD on 10/17/2008   Method used:   Printed then faxed to ...       [facility] - Pharmac (retail)       [address] [place], [place]  [address]       Ph: [phone]       Fax: [phone]   RxID:   [id number] PREMARIN 0.625 MG TABS (ESTROGENS CONJUGATED) TAKE ONE (1) TABLET EACH DAY  #30 x 2   Entered and Authorized by:   Julieanne Manson MD   Signed by:   Julieanne Manson MD on 10/17/2008   Method used:   Printed then faxed  to ...       [facility] - Pharmac (retail)       [address] [place], [place]  [address]       Ph: [phone]       Fax: [phone]   RxID:   [id number] LIPITOR 10 MG TABS (ATORVASTATIN CALCIUM) TAKE TWO (2) TABLETS BY MOUTH DAILY  #30 x 2   Entered and Authorized by:   Julieanne Manson MD   Signed by:   Julieanne Manson MD on 10/17/2008   Method used:   Printed then faxed to ...       [facility] - Pharmac (retail)       [address] [place], [place]  [address]       Ph: [phone]       Fax: [phone]   RxID:   [id number] MONOPRIL 10 MG TABS (FOSINOPRIL SODIUM) TAKE ONE (1) TABLET BY MOUTH EVERY DAY  #30 x 2   Entered and Authorized by:   Julieanne Manson MD   Signed by:   Julieanne Manson MD on 10/17/2008   Method used:   Printed then faxed to ...        [facility] - Pharmac (retail)       [address] [place], [place]  [address]       Ph: [phone]       Fax: [phone]   RxID:   [id number] CALAN SR 240 MG CR-TABS (VERAPAMIL HCL) TAKE ONE (1) TABLET EACH DAY   GENE ICP CALAN SR 240MG  08/08  #30 x 11   Entered and Authorized by:   Julieanne Manson MD   Signed by:   Julieanne Manson MD on 10/17/2008   Method used:   Printed then faxed to ...       [facility] - Pharmac (retail)       [address] [place], [place]  [address]       Ph: [phone]       Fax: [phone]   RxID:   [id number]

## 2010-11-17 NOTE — Progress Notes (Signed)
  Phone Note From Pharmacy   Summary of Call: PHD called per CMA as pt. there attempting to fill Rx for Xanax from a Dr. Norlene Campbell.  Asked it not be filled.  Pt. should have follow up with me. Initial call taken by: Julieanne Manson MD,  December 16, 2008 11:49 AM
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
## 2010-11-17 NOTE — Letter (Signed)
Summary: REQUESTING  RECORDS TACKS  REQUESTING  RECORDS TACKS   Imported By: Arta Bruce 05/27/2009 12:59:22  _____________________________________________________________________  External Attachment:    Type:   Image     Comment:   External Document

## 2010-11-17 NOTE — Assessment & Plan Note (Signed)
Summary: hospital f/u seizures/////rjp   Vital Signs:  Patient Profile:   60 Years Old Female Height:     60 inches Weight:      155 pounds BMI:     30.38 BSA:     1.68 Temp:     97.3 degrees F oral Pulse rate:   92 / minute Pulse rhythm:   regular Resp:     20 per minute BP sitting:   120 / 80  (left arm) Cuff size:   large  Pt. in pain?   no  Vitals Entered By: Gaylyn Cheers RN (December 03, 2008 11:31 AM)              Is Patient Diabetic? No  Does patient need assistance? Functional Status Self care Ambulation Normal Comments Given HCTZ 25mg   [facility] Given Skelaxin 800 mg by Dr. Devoria Albe     Chief Complaint:  [place]" has had numerous episodes over the years.  History of Present Illness: 1.  Seen in [place] by a female friend and described as having head jerking and reportedly became unconscious. Entire episode lasted 10-15 minutes and she reportedly became oriented quickly.  Not clear if EMS noted anything conconerning.   Have been unable to get a good history from pt. in recent past regarding this, but her friend stated in ED he had sen her staring and blinking her eyes in past as well.  Pt. reportedly was out of Lorazepam day previous to this episode.  Labs, including complete metabolic profile, CBC were normal. CT scan showed small vessel changes, but otherwise stable.  Follow up outpt. EEG was normal--awake and asleep. Her fiance (same gentleman who accompanied her to ED and was with her at bus depot)  is here today.   He states she did not have jerking of head that day (11/19/08)--was getting on bus to come here and be seen as was not feeling well--began moaning and having drainage from nose and mouth and laid head against him--called EMS.  She has been getting small prescriptions of Lorazepam and Skelaxin from ED and her fiance.  Pt. was unable to get into [facility] as  her fiance was there--he is done now.   They are currently staying in a hotel, but are not going to be able to stay much longer and will need to go to shelter.      Seen for altered mental status 12/02/08--Pt. reportedly took Vicodin before the change. Not clear by records where Vicodin Rx came from.  Labs and CT of brain were again without concern.  2.  Psych issues with polydrug abuse:  suspect also has something to do with above problems.  Pt. was to be weaning off Lorazepam and no Vicodin prescribed from this office .  Have spoken to Aquilla Solian, LCSW, MSW regarding this pt. in recent past.  Pt's problems at [facility] felt at this point to be related to chronic poly substance abuse and they have tried to get pt. treated for that.  She recommended getting pt. set up throught [facility] program.Pt. does have a sister, Loura Halt, who may be willing to take her in.  Her phone number is [phone].  Sister showed up at end of visit and is very willing to have sister live with her and get her signed up for inpatient drug treatment at [facility]  Fiance is  Roslynn Amble has been clean of alcohol and crack cocaine for past 50 days.  They have been together for over 1 year.  He states she has been clean also for 4-5 months--possibly longer.  He was the reason per pt. why they would not allow her treatment at [facility] recently --they used together in past.  He also seems very supportive.  Pt. was giving a urine sample and later admitted to just putting water from faucet in cup--specimen appeared too clear to be urine.  Pt. complaining of pain right posterior head and down right neck.  Chronic problem.  States this is why she has Skelaxin from ED.      Prior Medications Reviewed Using: Medication Bottles  Current Allergies (reviewed today): ! DOXYCYCLINE ! PENICILLIN ! MONOPRIL SULFA    Risk Factors:  Passive smoke exposure:  no  Family History Risk Factors:    Family History of MI in females <  56 years old:  yes    Family History of MI in males < 51 years old:  no    Physical Exam  General:     Snoring and difficult to awaken initially--no Lorazepam left in pill bottle from yesterday--had 5 total from visit. Eyes:     No corneal or conjunctival inflammation noted. EOMI. Perrla. Funduscopic exam benign, without hemorrhages, exudates or papilledema. Vision grossly normal. Ears:     External ear exam shows no significant lesions or deformities.  Otoscopic examination reveals clear canals, tympanic membranes are intact bilaterally without bulging, retraction, inflammation or discharge. Hearing is grossly normal bilaterally. Nose:     no nasal discharge.   Mouth:     pharynx pink and moist.   Neck:     tender along right sternocleidomastoid muscle and into right trap.    Impression & Recommendations:  Problem # 1:  SUBSTANCE ABUSE, MULTIPLE (ICD-305.90) Pt. to live with sister, Loura Halt Discussed getting her into [facility] for inpt. drug treatment.   Mr. Cherre Huger and Ms. Sena Slate both very supportive of this and recognize pt. has a drug problem--currently dealing with prescription drug abuse. To follow up here closely Spent an more than one hour face to face with family and pt./boyfriend today.  Complete Medication List: 1)  Calan Sr 240 Mg Cr-tabs (Verapamil hcl) .... Take one (1) tablet each day   gene icp calan sr 240mg  08/08 2)  Celexa 40 Mg Tabs (Citalopram hydrobromide) .... Take 1 tablet by mouth once a day 3)  Hydrocortisone 2.5 % Crea (Hydrocortisone) .... Apply to affected area three times daily 4)  Lipitor 10 Mg Tabs (Atorvastatin calcium) .... Take two (2) tablets by mouth daily 5)  Premarin 0.625 Mg Tabs (Estrogens conjugated) .... Take one (1) tablet each day 6)  Protonix 40 Mg Tbec (Pantoprazole sodium) .... Take one (1) tablet each day   gene icp protonix 40mg  08/08 7)  Triamterene-hctz 75-50 Mg Tabs (Triamterene-hctz) .... Take one (1) tablet by mouth every  day 8)  Allegra 180 Mg Tabs (Fexofenadine hcl) .Marland Kitchen.. 1 tab by mouth daily as needed congestion, cough 9)  Advair Diskus 100-50 Mcg/dose Misc (Fluticasone-salmeterol) .Marland Kitchen.. 1 inhalation two times a day 10)  Fluticasone Propionate 50 Mcg/act Susp (Fluticasone propionate) .... 2 sprays each nostril daily   Patient Instructions: 1)  Follow up with Dr. Delrae Alfred in 2 weeks. 2)  No more Lorazepam or controlled pain meds from any other physician.   Prescriptions: FLUTICASONE PROPIONATE 50 MCG/ACT SUSP (FLUTICASONE PROPIONATE) 2 sprays each nostril daily  #  1 x 11   Entered and Authorized by:   Julieanne Manson MD   Signed by:   Julieanne Manson MD on 12/03/2008   Method used:   Print then Give to Patient   RxID:   [id number] ADVAIR DISKUS 100-50 MCG/DOSE MISC (FLUTICASONE-SALMETEROL) 1 inhalation two times a day  #1 x 11   Entered and Authorized by:   Julieanne Manson MD   Signed by:   Julieanne Manson MD on 12/03/2008   Method used:   Print then Give to Patient   RxID:   [id number] ALLEGRA 180 MG TABS (FEXOFENADINE HCL) 1 tab by mouth daily as needed congestion, cough  #30 x 11   Entered and Authorized by:   Julieanne Manson MD   Signed by:   Julieanne Manson MD on 12/03/2008   Method used:   Print then Give to Patient   RxID:   [id number] TRIAMTERENE-HCTZ 75-50 MG TABS (TRIAMTERENE-HCTZ) TAKE ONE (1) TABLET BY MOUTH EVERY DAY  #30 x 11   Entered and Authorized by:   Julieanne Manson MD   Signed by:   Julieanne Manson MD on 12/03/2008   Method used:   Print then Give to Patient   RxID:   [id number] PROTONIX 40 MG TBEC (PANTOPRAZOLE SODIUM) TAKE ONE (1) TABLET EACH DAY   GENE ICP PROTONIX 40MG  08/08  #30 x 11   Entered and Authorized by:   Julieanne Manson MD   Signed by:   Julieanne Manson MD on 12/03/2008   Method used:   Print then Give to Patient   RxID:   [id number] PREMARIN 0.625 MG TABS (ESTROGENS CONJUGATED) TAKE ONE (1) TABLET EACH  DAY  #30 x 2   Entered and Authorized by:   Julieanne Manson MD   Signed by:   Julieanne Manson MD on 12/03/2008   Method used:   Print then Give to Patient   RxID:   [id number] LIPITOR 10 MG TABS (ATORVASTATIN CALCIUM) TAKE TWO (2) TABLETS BY MOUTH DAILY  #30 x 2   Entered and Authorized by:   Julieanne Manson MD   Signed by:   Julieanne Manson MD on 12/03/2008   Method used:   Print then Give to Patient   RxID:   [id number] CELEXA 40 MG  TABS (CITALOPRAM HYDROBROMIDE) Take 1 tablet by mouth once a day  #30 x 11   Entered and Authorized by:   Julieanne Manson MD   Signed by:   Julieanne Manson MD on 12/03/2008   Method used:   Print then Give to Patient   RxID:   [id number] CALAN SR 240 MG CR-TABS (VERAPAMIL HCL) TAKE ONE (1) TABLET EACH DAY   GENE ICP CALAN SR 240MG  08/08  #30 x 11   Entered and Authorized by:   Julieanne Manson MD   Signed by:   Julieanne Manson MD on 12/03/2008   Method used:   Print then Give to Patient   RxID:   [id number]

## 2010-11-17 NOTE — Letter (Signed)
Summary: REQUESTING RECORDS FROM [facility] BROUGHT THEM IN 11/08/08  REQUESTING RECORDS FROM [facility] BROUGHT THEM IN 11/08/08   Imported By: Silvio Pate Stanislawscyk 11/14/2008 15:26:46  _____________________________________________________________________  External Attachment:    Type:   Image     Comment:   External Document

## 2010-11-17 NOTE — Progress Notes (Signed)
Summary: ADVISED PATIENT TO GO TO HOSPITAL NOW  Phone Note Call from Patient Call back at [facility] Phone [phone]   Summary of Call: Danielle [name] PT. MS Kropf CALLED AND SAID THAT SHE IS SICK AND SHAKING AND BITTING HER TOUNGE. HER CARD IS NOT UP TO DATE. PER REGINA, I TOLD HER TO GET THE PERSON IN THE HOUSE WITH HER TO TAKE HER TO THE HOSPITAL NOW. I STAYED ON THE PHONE WITH HER WHILE SHE WENT TO WAKE THE LADY UP AND I ALSO SPOKE W/HER AND TOLD HER THAT MS Galik NEEDS TO GO NOW. Initial call taken by: Leodis Rains,  January 08, 2010 9:13 AM

## 2010-11-17 NOTE — Assessment & Plan Note (Signed)
Summary: Acute - Bacterial Vaginosis   Vital Signs:  Patient Profile:   60 Years Old Female Height:     60 inches Weight:      150 pounds BMI:     29.40 BSA:     1.65 Temp:     96.8 degrees F oral Pulse rate:   72 / minute Pulse rhythm:   regular Resp:     16 per minute BP sitting:   130 / 86  (left arm) Cuff size:   regular  Pt. in pain?   no  Vitals Entered By: Levon Hedger (May 24, 2008 2:47 PM)              Is Patient Diabetic? No  Does patient need assistance? Ambulation Normal Comments pt did not bring medications today     History of Present Illness:  Pt into the office with some vaginal discharge - clear. fishy odor some abd pain No menses Pt is not sexually active at this time.  no dysuria regular bowel habits    Current Allergies (reviewed today): ! DOXYCYCLINE ! PENICILLIN    Risk Factors:  Tobacco use:  never Drug use:  no Caffeine use:  2 drinks per day Alcohol use:  no Sun Exposure:  occasionally   Review of Systems  General      Denies fever.  CV      Denies chest pain or discomfort.  Resp      Denies cough.  GI      Complains of abdominal pain.      intermittent  GU      Complains of discharge.      Denies dysuria and hematuria.   Physical Exam  General:     alert.   Head:     normocephalic.   Lungs:     normal breath sounds.   Heart:     normal rate and regular rhythm.   Abdomen:     soft, non-tender, and normal bowel sounds.   Neurologic:     alert & oriented X3.      Impression & Recommendations:  Problem # 1:  BACTERIAL VAGINITIS (ICD-616.10)  Her updated medication list for this problem includes:    Metronidazole 500 Mg Tabs (Metronidazole) .Marland Kitchen... 1 tablet by mouth two times a day  Orders: KOH/ WET Mount [phone]) T- GC Chlamydia ([id number])   Complete Medication List: 1)  Calan Sr 240 Mg Cr-tabs (Verapamil hcl) .... Take one (1) tablet each day   gene icp calan sr 240mg  08/08 2)  Celexa  20 Mg Tabs (Citalopram hydrobromide) .... Take one (1) tablet at bedtime 3)  Hydrocortisone 2.5 % Crea (Hydrocortisone) .... Apply to affected area three times daily 4)  Lipitor 10 Mg Tabs (Atorvastatin calcium) .... Take two (2) tablets by mouth daily 5)  Monopril 10 Mg Tabs (Fosinopril sodium) .... Take one (1) tablet by mouth every day 6)  Prednisone 5 Mg Tabs (Prednisone) .... 6 tablets on day 1, 5 tablets on day 2 4 tabs on day 3,  3 tabs on day 4, 2 tablets on day 5, 1 tablet on day 6 7)  Premarin 0.625 Mg Tabs (Estrogens conjugated) .... Take one (1) tablet each day 8)  Protonix 40 Mg Tbec (Pantoprazole sodium) .... Take one (1) tablet each day   gene icp protonix 40mg  08/08 9)  Triamterene-hctz 75-50 Mg Tabs (Triamterene-hctz) .... Take one (1) tablet by mouth every day 10)  Alprazolam 0.5 Mg Tabs (Alprazolam) .Marland KitchenMarland KitchenMarland Kitchen  1 tab by mouth q a.m., 1 tab by mouth at noon, 2 tabs by mouth at hs 11)  Metronidazole 500 Mg Tabs (Metronidazole) .Marland Kitchen.. 1 tablet by mouth two times a day   Patient Instructions: 1)  Take medications as ordered   Prescriptions: METRONIDAZOLE 500 MG  TABS (METRONIDAZOLE) 1 tablet by mouth two times a day  #14 x 0   Entered and Authorized by:   Lehman Prom FNP   Signed by:   Lehman Prom FNP on 05/24/2008   Method used:   Print then Give to Patient   RxID:   [id number] METRONIDAZOLE 500 MG  TABS (METRONIDAZOLE) 4 tablets by mouth x 1 dose  #4 x 0   Entered and Authorized by:   Lehman Prom FNP   Signed by:   Lehman Prom FNP on 05/24/2008   Method used:   Print then Give to Patient   RxID:   [id number]  ] Laboratory Results   Urine Tests  Date/Time Received: May 24, 2008 3:09 PM  Date/Time Reported: May 24, 2008 3:09 PM   Routine Urinalysis   Color: lt. yellow Appearance: Clear Glucose: negative   (Normal Range: Negative) Bilirubin: negative   (Normal Range: Negative) Ketone: negative   (Normal Range: Negative) Spec. Gravity:  1.020   (Normal Range: 1.003-1.035) Blood: negative   (Normal Range: Negative) pH: 6.0   (Normal Range: 5.0-8.0) Protein: negative   (Normal Range: Negative) Urobilinogen: 0.2   (Normal Range: 0-1) Nitrite: negative   (Normal Range: Negative) Leukocyte Esterace: negative   (Normal Range: Negative)    Date/Time Received: May 24, 2008 4:03 PM   Wet Mount/KOH Source: vaginal WBC/hpf: 1-5 Bacteria/hpf: 1+ Clue cells/hpf: moderate Yeast/hpf: none Trichomonas/hpf: none     Vital Signs:  Patient Profile:   60 Years Old Female Height:     60 inches Weight:      150 pounds BMI:     29.40 BSA:     1.65 Temp:     96.8 degrees F oral Pulse rate:   72 / minute Pulse rhythm:   regular Resp:     16 per minute BP sitting:   130 / 86 Cuff size:   regular                Laboratory Results   Urine Tests    Routine Urinalysis   Color: lt. yellow Appearance: Clear Glucose: negative   (Normal Range: Negative) Bilirubin: negative   (Normal Range: Negative) Ketone: negative   (Normal Range: Negative) Spec. Gravity: 1.020   (Normal Range: 1.003-1.035) Blood: negative   (Normal Range: Negative) pH: 6.0   (Normal Range: 5.0-8.0) Protein: negative   (Normal Range: Negative) Urobilinogen: 0.2   (Normal Range: 0-1) Nitrite: negative   (Normal Range: Negative) Leukocyte Esterace: negative   (Normal Range: Negative)      Wet Mount/KOH KOH Negative

## 2010-11-17 NOTE — Assessment & Plan Note (Signed)
Summary: chart update      Current Allergies: ! DOXYCYCLINE ! PENICILLIN SULFA    Risk Factors:       Complete Medication List: 1)  Calan Sr 240 Mg Cr-tabs (Verapamil hcl) .... Take one (1) tablet each day   gene icp calan sr 240mg  08/08 2)  Celexa 40 Mg Tabs (Citalopram hydrobromide) .... Take 1 tablet by mouth once a day 3)  Hydrocortisone 2.5 % Crea (Hydrocortisone) .... Apply to affected area three times daily 4)  Lipitor 10 Mg Tabs (Atorvastatin calcium) .... Take two (2) tablets by mouth daily 5)  Monopril 10 Mg Tabs (Fosinopril sodium) .... Take one (1) tablet by mouth every day 6)  Premarin 0.625 Mg Tabs (Estrogens conjugated) .... Take one (1) tablet each day 7)  Protonix 40 Mg Tbec (Pantoprazole sodium) .... Take one (1) tablet each day   gene icp protonix 40mg  08/08 8)  Triamterene-hctz 75-50 Mg Tabs (Triamterene-hctz) .... Take one (1) tablet by mouth every day 9)  Alprazolam 0.5 Mg Tabs (Alprazolam) .Marland Kitchen.. 1 tab by mouth three times a day for 2 weeks, then 1 tab by mouth two times a day for 2 weeks, then 1 tab by mouth daily until follow up. 10)  Allegra 180 Mg Tabs (Fexofenadine hcl) .Marland Kitchen.. 1 tab by mouth daily as needed congestion, cough 11)  Advair Diskus 100-50 Mcg/dose Misc (Fluticasone-salmeterol) .Marland Kitchen.. 1 inhalation two times a day 12)  Fluticasone Propionate 50 Mcg/act Susp (Fluticasone propionate) .... 2 sprays each nostril daily    ]

## 2010-11-17 NOTE — Letter (Signed)
[facility]   Imported By: Arta Bruce 04/14/2009 12:21:16  _____________________________________________________________________  External Attachment:    Type:   Image     Comment:   External Document

## 2010-11-17 NOTE — Progress Notes (Signed)
   Phone Note From Pharmacy   Caller: [facility] Summary of Call: Request for refill of Alprazolam--last filled 05/03/08  #120  1 tab q am, 1 tab at noon and 2 tabs at hs.  Has left before being seen x 2 since Feb 2009.  Has not been seen since 12/08.  Previously followed by Duke Salvia, FNP.  No refills today as too early--will refill 1 time beginning 06/03/08, but must be seen before another refill--please make sure she gets an appt. Initial call taken by: Julieanne Manson MD,  May 15, 2008 9:37 AM  Follow-up for Phone Call        pt has appt Sept 3 w/[facility] & pt number is no longer in service.  did advise pharmacy of refill 1x on 06/03/08 but at that time the pharmacy advised me that the pt also got a refill on 05/03/08 #60 as well. I advised pharmacy to hold on the authorization until I spoke with Dr Delrae Alfred tomorrow to see if she was aware of this information and at that time would call them back. Follow-up by: Mikey College CMA,  May 15, 2008 11:24 AM  Additional Follow-up for Phone Call Additional follow up Details #1::        No refills until 06/03/08 Additional Follow-up by: Julieanne Manson MD,  May 16, 2008 7:36 PM    Additional Follow-up for Phone Call Additional follow up Details #2::    Pharmacy notified not to be filled until 06/03/08 [facility]) Follow-up by: Vesta Mixer CMA,  May 17, 2008 4:26 PM  New/Updated Medications: CALAN SR 240 MG CR-TABS (VERAPAMIL HCL) TAKE ONE (1) TABLET EACH DAY   GENE ICP CALAN SR 240MG  08/08 CELEXA 20 MG TABS (CITALOPRAM HYDROBROMIDE) TAKE ONE (1) TABLET AT BEDTIME HYDROCORTISONE 2.5 % CREA (HYDROCORTISONE) APPLY TO AFFECTED AREA THREE TIMES DAILY LIPITOR 10 MG TABS (ATORVASTATIN CALCIUM) TAKE TWO (2) TABLETS BY MOUTH DAILY MONOPRIL 10 MG TABS (FOSINOPRIL SODIUM) TAKE ONE (1) TABLET BY MOUTH EVERY DAY PREDNISONE 5 MG TABS (PREDNISONE) 6 TABLETS ON DAY 1, 5 TABLETS ON DAY 2 4 TABS ON DAY 3,  3 TABS ON DAY 4, 2 TABLETS ON DAY 5,  1 TABLET ON DAY 6 PREMARIN 0.625 MG TABS (ESTROGENS CONJUGATED) TAKE ONE (1) TABLET EACH DAY PROTONIX 40 MG TBEC (PANTOPRAZOLE SODIUM) TAKE ONE (1) TABLET EACH DAY   GENE ICP PROTONIX 40MG  08/08 TRIAMTERENE-HCTZ 75-50 MG TABS (TRIAMTERENE-HCTZ) TAKE ONE (1) TABLET BY MOUTH EVERY DAY ALPRAZOLAM 0.5 MG  TABS (ALPRAZOLAM) 1 tab by mouth q a.m., 1 tab by mouth at noon, 2 tabs by mouth at hs   Prescriptions: ALPRAZOLAM 0.5 MG  TABS (ALPRAZOLAM) 1 tab by mouth q a.m., 1 tab by mouth at noon, 2 tabs by mouth at hs  #0 x 0   Entered and Authorized by:   Julieanne Manson MD   Signed by:   Julieanne Manson MD on 05/15/2008   Method used:   Faxed to ...       [facility]  [address] [phone]*       [address]       [place], [place]  [address]       Ph: [phone]       Fax: [phone]   RxID:   [id number]

## 2010-11-17 NOTE — Progress Notes (Signed)
  Phone Note From Pharmacy   Summary of Call: Call from [facility] pt presenting with controlled substance rx from ED in violation of her pain med contract.  Per Dr. Delrae Alfred fax copy to [place] and destroy. Initial call taken by: Vesta Mixer CMA,  April 04, 2009 5:59 PM

## 2010-11-17 NOTE — Assessment & Plan Note (Signed)
Summary: 6wk f/u//////rjp   Vital Signs:  Patient Profile:   60 Years Old Female Height:     60 inches Weight:      146 pounds Temp:     97.6 degrees F oral Pulse rate:   80 / minute Pulse rhythm:   regular Resp:     20 per minute BP sitting:   128 / 80  (left arm) Cuff size:   regular  Pt. in pain?   no                  Chief Complaint:  pt is here to get her medications refill.  History of Present Illness: 1.  Panic Attacks:  Apparently missed appt. with Rolena Infante out of town and sounds like did not call to cancel.  Pt. still having Panic Attacks 2-3 times daily.  Pt. again, describes episodes as shortness of breath, some palpitations, maybe diaphoresis, but more so, has jerking of head to right--develops pain in right neck after occurs for a while.  Lies on a heating pad an helps.  Has been taking 40 mg of Citalopram daily since beginning of August per pt, but doubtful as apparently no refills after Sept and pt. did not call in for more.  Currently using Alprazolam once daily--neither seems to help.  Pt. states has been out of Alprazolam past 2 days.  Pt. initially states has not been on other meds for panic attack, but later states does not tolerate Zoloft, Paxil or Prozac--has used all of these in past.  2.  Problems with breathing.  Using Albuterol 4 times daily for about 2-3 weeks.  Pt. states she has a long history of asthma--not mentioned at last visit.  Not smoking, but does have history of doing so.  Some coughing--nonproductive.  Chest feels tight.  Does have history of allergies as well--summer and fall.  Having a runny nose, no itching.  Eyes have also been watering.  Has been using Allegra--though didn't seem to know what it was when initially asked.    Prior Medications Reviewed Using: Medication Bottles  Updated Prior Medication List: pt need refill on Alprazolam only Current Allergies: ! DOXYCYCLINE ! PENICILLIN      Physical Exam  General:  NAD Eyes:     No corneal or conjunctival inflammation noted. EOMI. Perrla. Funduscopic exam benign, without hemorrhages, exudates or papilledema. Vision grossly normal. Ears:     External ear exam shows no significant lesions or deformities.  Otoscopic examination reveals clear canals, tympanic membranes are intact bilaterally without bulging, retraction, inflammation or discharge. Hearing is grossly normal bilaterally. Nose:     nasal dischargemucosal pallor.   Mouth:     pharynx pink and moist.   Neck:     No deformities, masses, or tenderness noted. Lungs:     Normal respiratory effort, chest expands symmetrically. Lungs are clear to auscultation, no crackles or wheezes. Heart:     Normal rate and regular rhythm. S1 and S2 normal without gallop, murmur, click, rub or other extra sounds.    Impression & Recommendations:  Problem # 1:  ANXIETY DISORDER, GENERALIZED (ICD-300.02) Pt. needs to get in with Psych.--will not continue to fill Alprazolam if misses next visit with Aquilla Solian Check urine tox screen today to see if actually has benzodiazepine in urine. Looking through records briefly, has had a long hx of psych disorder. Her updated medication list for this problem includes:    Celexa 40 Mg Tabs (Citalopram hydrobromide) .Marland Kitchen... Take  1 tablet by mouth once a day    Alprazolam 0.5 Mg Tabs (Alprazolam) .Marland Kitchen... 1 tab by mouth three times a day for 2 weeks, then 1 tab by mouth two times a day for 2 weeks, then 1 tab by mouth daily until follow up.  Orders: Psychology Referral (Psychology)   Problem # 2:  ASTHMA (ICD-493.90) Looking through chart, pt. does have hx of asthma. Does not appear to have been on maintenance meds in past, just rescue inhaler. Start Advair. Her updated medication list for this problem includes:    Advair Diskus 100-50 Mcg/dose Misc (Fluticasone-salmeterol) .Marland Kitchen... 1 inhalation two times a day   Problem # 3:  ALLERGIC RHINITIS WITH CONJUNCTIVITIS  (ICD-477.9) Control this for better control of asthma Her updated medication list for this problem includes:    Allegra 180 Mg Tabs (Fexofenadine hcl) .Marland Kitchen... 1 tab by mouth daily as needed congestion, cough    Fluticasone Propionate 50 Mcg/act Susp (Fluticasone propionate) .Marland Kitchen... 2 sprays each nostril daily   Complete Medication List: 1)  Calan Sr 240 Mg Cr-tabs (Verapamil hcl) .... Take one (1) tablet each day   gene icp calan sr 240mg  08/08 2)  Celexa 40 Mg Tabs (Citalopram hydrobromide) .... Take 1 tablet by mouth once a day 3)  Hydrocortisone 2.5 % Crea (Hydrocortisone) .... Apply to affected area three times daily 4)  Lipitor 10 Mg Tabs (Atorvastatin calcium) .... Take two (2) tablets by mouth daily 5)  Monopril 10 Mg Tabs (Fosinopril sodium) .... Take one (1) tablet by mouth every day 6)  Premarin 0.625 Mg Tabs (Estrogens conjugated) .... Take one (1) tablet each day 7)  Protonix 40 Mg Tbec (Pantoprazole sodium) .... Take one (1) tablet each day   gene icp protonix 40mg  08/08 8)  Triamterene-hctz 75-50 Mg Tabs (Triamterene-hctz) .... Take one (1) tablet by mouth every day 9)  Alprazolam 0.5 Mg Tabs (Alprazolam) .Marland Kitchen.. 1 tab by mouth three times a day for 2 weeks, then 1 tab by mouth two times a day for 2 weeks, then 1 tab by mouth daily until follow up. 10)  Allegra 180 Mg Tabs (Fexofenadine hcl) .Marland Kitchen.. 1 tab by mouth daily as needed congestion, cough 11)  Advair Diskus 100-50 Mcg/dose Misc (Fluticasone-salmeterol) .Marland Kitchen.. 1 inhalation two times a day 12)  Fluticasone Propionate 50 Mcg/act Susp (Fluticasone propionate) .... 2 sprays each nostril daily   Patient Instructions: 1)  Reschedule with Aquilla Solian 2)  No further refills of Alprazolam if misses appt. with Marchelle Folks. 3)  Call for follow up with Dr. Delrae Alfred in 2 months.   Prescriptions: ALPRAZOLAM 0.5 MG  TABS (ALPRAZOLAM) 1 tab by mouth three times a day for 2 weeks, then 1 tab by mouth two times a day for 2 weeks, then 1 tab by  mouth daily until follow up.  #30 x 0   Entered and Authorized by:   Julieanne Manson MD   Signed by:   Julieanne Manson MD on 08/01/2008   Method used:   Print then Give to Patient   RxID:   [id number] CELEXA 40 MG  TABS (CITALOPRAM HYDROBROMIDE) Take 1 tablet by mouth once a day  #30 x 6   Entered and Authorized by:   Julieanne Manson MD   Signed by:   Julieanne Manson MD on 08/01/2008   Method used:   Printed then faxed to ...       [facility] (retail)       [address]  [address] [place]  [address]       Ph: [phone]       Fax: [phone]   RxID:   [id number] ALLEGRA 180 MG TABS (FEXOFENADINE HCL) 1 tab by mouth daily as needed congestion, cough  #30 x 6   Entered and Authorized by:   Julieanne Manson MD   Signed by:   Julieanne Manson MD on 08/01/2008   Method used:   Printed then faxed to ...       [facility] - [facility] (retail)       [address] [place], [place]  [address]       Ph: [phone]       Fax: [phone]   RxID:   [id number] FLUTICASONE PROPIONATE 50 MCG/ACT SUSP (FLUTICASONE PROPIONATE) 2 sprays each nostril daily  #1 x 6   Entered and Authorized by:   Julieanne Manson MD   Signed by:   Julieanne Manson MD on 08/01/2008   Method used:   Printed then faxed to ...       [facility] - [facility] (retail)       [address] [place], [place]  [address]       Ph: [phone]       Fax: [phone]   RxID:   [id number] ADVAIR DISKUS 100-50 MCG/DOSE MISC (FLUTICASONE-SALMETEROL) 1 inhalation two times a day  #1 x 6   Entered and Authorized by:   Julieanne Manson MD   Signed by:   Julieanne Manson MD on 08/01/2008   Method used:   Printed then faxed to ...       [facility] - Pharmac (retail)       [address] [place], [place]  [address]       Ph:  [phone]       Fax: [phone]   RxID:   [id number]  ]  Appended Document: Orders Update Concern not using alprazolam for personal use--last used per her report 2 days ago.   Clinical Lists Changes  Orders: Added new Test order of T- * Misc. Laboratory test [id number]) - Signed

## 2010-11-17 NOTE — Assessment & Plan Note (Signed)
Summary: PANIC ATTACKS & MEDS / NS   Vital Signs:  Patient Profile:   60 Years Old Female Height:     60 inches Weight:      143.5 pounds BMI:     28.13 Temp:     97.0 degrees F Pulse rate:   80 / minute Pulse rhythm:   regular Resp:     20 per minute BP sitting:   136 / 90  (left arm) Cuff size:   regular  Pt. in pain?   no  Vitals Entered By: Vesta Mixer CMA (June 20, 2008 9:02 AM)              Is Patient Diabetic? No  Does patient need assistance? Ambulation Normal     Chief Complaint:  f/u panic attacks and needs refills on meds also having cough x 1 week.  History of Present Illness: 60 year old female, previous patient of Duke Salvia, FNP here ot establish with me.  Concerns  1.  Panic attacks:  Pt's urine tox was negative for benzodiazepines when tested 05/28/08--states she had been off Xanax for maybe 2 days.  Did increase Celexa to 40 mg daily at the 8/11 visit.  Pt. states she is still having panic attacks 2-4 times daily.  Becomes short of breath, states she starts to "jerk and pull", palpitations, sweaty and feels very anxious.  Has been occurring for over 1 year.  Started after fired from job--started with just depression.  Has never had any counseling.  Last Rx of Xanax was 06/03/08--60 tabs.   Finished that Xanax Rx 2-3 days ago.   2.  10 days ago:  started with a sore throat and runny nose plus a hacking cough.  Cough originally green and yellow--now clear to white.  Off and on has felt feverish,but no temp taken.  Originally with body aches, but that has resolved.  Lot of posterior pharyngeal drainage.  Little bit of itchy watery eyes.  Sneezing a bit--was worse on onset.  Feels short of breath.    Current Allergies (reviewed today): ! DOXYCYCLINE ! PENICILLIN  Past Medical History:    DEEP VENOUS THROMBOPHLEBITIS, LEFT, LEG, HX OF (ICD-V12.52)    GERD (ICD-530.81)    HYPERTENSION, BENIGN ESSENTIAL (ICD-401.1)    ANXIETY DISORDER, GENERALIZED  (ICD-300.02)    COCAINE ABUSE (ICD-305.60)    BACTERIAL VAGINITIS (ICD-616.10)      Past Surgical History:    1.  1990:  TAH and USO--?left    2.  Laparoscopic pelvic surgery--for pelvic pain--predating TAH    3.  Colonoscopy--predating TAH--normal exam.   Family History:    Mother, died 53:  MI, stroke, hypertension, alcoholism    Father, died 57:  DM, alcoholism    6 siblings:  2 sisters have drug and alcohol addiction.  All with hypertension, mental health issues.  One with stroke x2 at age 7.  One brother with kidney disease from poorly controlled hypertension--on dialysis.    2 children:  Both bipolar--one definitively diagnosed  Social History:    Lives with Aunt    unemployed   Risk Factors:  Tobacco use:  quit    Year quit:  1/2 ppd.  Quit 2 years ago.  Smoked since 1988 Drug use:  yes    Substance:  cocaine    Comments:  injected cocaine  years ago(1973), snorted and smoked crack cocaine.  Quit cocaine completely 6 months--no treatment. Alcohol use:  yes    Type:  Hx  of alcoholism 1988 to 1993.  Sober since. Exercise:  yes    Times per week:  7    Type:  walks--maybe a mile Seatbelt use:  100 %    Physical Exam  General:     NAD.  No coughing while examiner in room. Head:     NT over sinuses Eyes:     No corneal or conjunctival inflammation noted. EOMI. Perrla. Funduscopic exam benign, without hemorrhages, exudates or papilledema. Vision grossly normal. Ears:     External ear exam shows no significant lesions or deformities.  Otoscopic examination reveals clear canals, tympanic membranes are intact bilaterally without bulging, retraction, inflammation or discharge. Hearing is grossly normal bilaterally. Nose:     Mild mucosal swelling and clear discharge. Mouth:     pharynx pink and moist.   Neck:     No deformities, masses, or tenderness noted. Lungs:     Normal respiratory effort, chest expands symmetrically. Lungs are clear to auscultation, no  crackles or wheezes. Heart:     Normal rate and regular rhythm. S1 and S2 normal without gallop, murmur, click, rub or other extra sounds.    Impression & Recommendations:  Problem # 1:  ANXIETY DISORDER, GENERALIZED (ICD-300.02) Pt. still basically taking 4 tabs or near that daily of Xanax. Pt. to decrease to three times a day dosing for 2 weeks, then two times a day dosing for 2 weeks, then 1 times a day dosing for 2 weeks, then follow up with me and will go to as needed thereafter. The following medications were removed from the medication list:    Clonazepam 2 Mg Tabs (Clonazepam) .Marland Kitchen... Take 1 tablet by mouth every 12 hours for anxiety for the next 6 days no refills  Her updated medication list for this problem includes:    Celexa 40 Mg Tabs (Citalopram hydrobromide) .Marland Kitchen... Take 1 tablet by mouth once a day    Alprazolam 0.5 Mg Tabs (Alprazolam) .Marland Kitchen... 1 tab by mouth three times a day for 2 weeks, then 1 tab by mouth two times a day for 2 weeks, then 1 tab by mouth daily until follow up.  Orders: Psychology Referral (Psychology)   Problem # 2:  URI (ICD-465.9)  Her updated medication list for this problem includes:    Allegra 180 Mg Tabs (Fexofenadine hcl) .Marland Kitchen... 1 tab by mouth daily as needed congestion, cough   Complete Medication List: 1)  Calan Sr 240 Mg Cr-tabs (Verapamil hcl) .... Take one (1) tablet each day   gene icp calan sr 240mg  08/08 2)  Celexa 40 Mg Tabs (Citalopram hydrobromide) .... Take 1 tablet by mouth once a day 3)  Hydrocortisone 2.5 % Crea (Hydrocortisone) .... Apply to affected area three times daily 4)  Lipitor 10 Mg Tabs (Atorvastatin calcium) .... Take two (2) tablets by mouth daily 5)  Monopril 10 Mg Tabs (Fosinopril sodium) .... Take one (1) tablet by mouth every day 6)  Premarin 0.625 Mg Tabs (Estrogens conjugated) .... Take one (1) tablet each day 7)  Protonix 40 Mg Tbec (Pantoprazole sodium) .... Take one (1) tablet each day   gene icp protonix 40mg   08/08 8)  Triamterene-hctz 75-50 Mg Tabs (Triamterene-hctz) .... Take one (1) tablet by mouth every day 9)  Alprazolam 0.5 Mg Tabs (Alprazolam) .Marland Kitchen.. 1 tab by mouth three times a day for 2 weeks, then 1 tab by mouth two times a day for 2 weeks, then 1 tab by mouth daily until follow up. 10)  Allegra  180 Mg Tabs (Fexofenadine hcl) .Marland Kitchen.. 1 tab by mouth daily as needed congestion, cough   Patient Instructions: 1)  PUsh fluids 2)  Cool mist humidifier. 3)  Wean Xanax to use only 1 tab three times a day for 2 weeks, then decrease to 1 tab by mouth two times a day for 2 weeks, then 1 tab daily until follow up with me 4)  Follow up in 6 weeks with Dr. Delrae Alfred 5)  Referral to [facility] for counseling and psych eval. 6)  You must keep all of your appts.  or your Xanax will not be filled.   Prescriptions: ALLEGRA 180 MG TABS (FEXOFENADINE HCL) 1 tab by mouth daily as needed congestion, cough  #30 x 1   Entered and Authorized by:   Julieanne Manson MD   Signed by:   Julieanne Manson MD on 06/20/2008   Method used:   Print then Give to Patient   RxID:   [id number] ALPRAZOLAM 0.5 MG  TABS (ALPRAZOLAM) 1 tab by mouth three times a day for 2 weeks, then 1 tab by mouth two times a day for 2 weeks, then 1 tab by mouth daily until follow up.  #66 x 0   Entered and Authorized by:   Julieanne Manson MD   Signed by:   Julieanne Manson MD on 06/20/2008   Method used:   Print then Give to Patient   RxID:   [id number]  ]

## 2010-11-17 NOTE — Progress Notes (Signed)
Summary: ? ABOUT HER MEDS  Phone Note Call from Patient Call back at Home Phone [phone]   Reason for Call: Talk to Nurse Summary of Call: Niyah Mamaril PT. [facility] PICKED UP HER MEDICATION TODAY. THEY GAVE HER LISINIPRIL, AND SHE SAYS THAT DR. Keyler Hoge TOOK HER OFF OF THIS ONE. AND SHE WANTS TO KNOW WHAT IS SHE SUPOSE TO BE ON. AND THEY DIDN'T GIVE HER HER ADVAIR.  [facility] SAYS SHE IS NOT GOING TO TAKE THE LISINIPRIL.  ALSO SHE NEEDS HER LORAZAPAM, BECAUSE THE [facility] IS NOT HELPING HER, IF WE CAN CALL IT INTO [facility] [address]. Initial call taken by: Leodis Rains,  October 28, 2008 4:19 PM  Follow-up for Phone Call        The pt needs to provider to call in for her fexopenadine and her fluid pills.  ([facility] [address])    Additional Follow-up for Phone Call Additional follow up Details #1::        Spoke with pt she got refill of lisinopril from [facility]  She did not get her calan, advair or allegra or flonase.  Spoke with Tamera Punt she was too soon for her calan last got 12/21 and the other meds are now ready pt can come get them and they took lisinopril off her med list.  Pt aware. Additional Follow-up by: Vesta Mixer CMA,  October 30, 2008 10:24 AM    Additional Follow-up for Phone Call Additional follow up Details #2::    Per pt [facility] keeps turning her away and tried to send her to [facility] she went to [facility] on 12/29, but a former boyfriend/stalker was there at the time and she could not stay there while he was there.  He is supposed to be released in about a week or week and half then she can go, she wants lorazepam 5mg  taking one a day  to help her nerves until she can get into [facility] [address]  will let PCP decide whether or not to add lorazepam  n.martin, fnp  October 30, 2008 5:08 PM  Follow-up by: Vesta Mixer CMA,  October 30, 2008 10:55 AM  Additional Follow-up for Phone Call Additional follow up Details #3:: Details for  Additional Follow-up Action Taken: A am awaiting records from [facility] to evaluate situation.  Until I get those records, no lorazepam.  spoke with pt and she says she will give them a call and see what is taking so long for the records to get to [place]... [place] Shannon  November 05, 2008 10:17 AM

## 2010-11-17 NOTE — Letter (Signed)
Summary: REFERRAL/PSYCHOLOGY/APPT DATE & TIME  REFERRAL/PSYCHOLOGY/APPT DATE & TIME   Imported By: Arta Bruce 05/12/2009 11:00:30  _____________________________________________________________________  External Attachment:    Type:   Image     Comment:   External Document

## 2010-11-17 NOTE — Assessment & Plan Note (Signed)
Summary: GENERAL VISIT//KT   Vital Signs:  Patient Profile:   60 Years Old Female Height:     60 inches Weight:      146 pounds BMI:     28.62 Temp:     95.6 degrees F Pulse rate:   88 / minute Pulse rhythm:   regular Resp:     20 per minute BP sitting:   124 / 84  (left arm) Cuff size:   regular  Pt. in pain?   yes    Location:   neck    Intensity:   7  Vitals Entered By: Vesta Mixer CMA (October 25, 2008 9:37 AM)              Is Patient Diabetic? No  Does patient need assistance? Ambulation Normal     Chief Complaint:  f/u from 10/09 visit  has not seen Marchelle Folks per pt. needs refills.  History of Present Illness: Pt. here as her requests for meds are not clear.   Pt. gives a very difficult history to follow today  Went to ED because of lip and tongue/facial swelling felt secondary to Lisinopril(which she was taking as a substitute for Monopril.)  Not clear, but sounds like she was given someone elses Diovan last month from our pharmacy (similar name)  Pt. did not take.  It does not appear she was given a new prescription for this at the ED.  Problems: 1.  Hypertension:  has only been using Calan Sr 240 recently--out of Triamterene/HCTZ for some time.  Has not taken Lisinopril since ED visit.  2.  HRT:  Having hot flashes and night sweats since off Premarin completely for past 2 weeks.  Has not had mammogram in some time.  3.  Asthma:  Has not been on Advair since last visit.  Does not appear she ever filled the presciptions given to her last appt. in October, including Advair, nasal fluticasone and Allegra--states for unknown reasons, she took them to a retail pharmacy and could not afford --usually gets med via [employer].  4.  Allergies:  see above--not using meds.  Was on corticosteroids recently for angioedema and symptoms currently okay.  5.  Hypercholesterolemia:  Off Lipitor for some time  6.  Panic Attacks:  Initially, states she is doing well on  Celexa, but then states she shakes and jerks if she does not take Alprazolam.  Has been out of the latter for 2 weeks or so.  Was supposed to wean off after last visit and was given a second wean prescription last month.  Tested negative for benzodiazepines last visit.  Was seen by [facility] and sent to [facility]--pt. states they told her she just needs to follow with [place].  Later, becomes clear she is being followed at [facility] who sent her to [facility] to detox--not clear what she was felt to need to detox from--pt. denies using cocaine.  States she was and had been using MJ.  Not clear if she was abusing benzodiazepines as well.  Pt. very unclear about this.  Is to call [facility] in near future    Prior Medications Reviewed Using: Medication Bottles  Updated Prior Medication List: no bottles for allegra, xanax or fluticasone  Current Allergies (reviewed today): ! DOXYCYCLINE ! PENICILLIN ! MONOPRIL SULFA      Physical Exam  General:     NAD Lungs:     Normal respiratory effort, chest expands symmetrically. Lungs are clear to auscultation, no  crackles or wheezes. Heart:     Normal rate and regular rhythm. S1 and S2 normal without gallop, murmur, click, rub or other extra sounds.  Repeat BP was 138/94    Impression & Recommendations:  Problem # 1:  HYPERLIPIDEMIA (ICD-272.4) Not on med recently--will need to delay FLP Her updated medication list for this problem includes:    Lipitor 10 Mg Tabs (Atorvastatin calcium) .Marland Kitchen... Take two (2) tablets by mouth daily   Problem # 2:  HYPERTENSION, BENIGN ESSENTIAL (ICD-401.1) Not controlled on Calan alone Restart Triamterent/HCTZ The following medications were removed from the medication list:    Monopril 10 Mg Tabs (Fosinopril sodium) .Marland Kitchen... Take one (1) tablet by mouth every day  Her updated medication list for this problem includes:    Calan Sr 240 Mg Cr-tabs (Verapamil hcl) .Marland Kitchen... Take one (1) tablet each day   gene icp calan sr  240mg  08/08    Triamterene-hctz 75-50 Mg Tabs (Triamterene-hctz) .Marland Kitchen... Take one (1) tablet by mouth every day  Orders: T-CBC w/Diff ([id number]) UA Dipstick w/o Micro (manual) (09811)   Problem # 3:  Preventive Health Care (ICD-V70.0) Needs to go and get Mammogram before will refill ERT--discussed with pt. Scheduled  Problem # 4:  ASTHMA (ICD-493.90) Will fax Rx to pharmacy to make sure actually turned in for refill Her updated medication list for this problem includes:    Advair Diskus 100-50 Mcg/dose Misc (Fluticasone-salmeterol) .Marland Kitchen... 1 inhalation two times a day   Problem # 5:  ALLERGIC RHINITIS WITH CONJUNCTIVITIS (ICD-477.9) As in asthma Her updated medication list for this problem includes:    Allegra 180 Mg Tabs (Fexofenadine hcl) .Marland Kitchen... 1 tab by mouth daily as needed congestion, cough    Fluticasone Propionate 50 Mcg/act Susp (Fluticasone propionate) .Marland Kitchen... 2 sprays each nostril daily   Problem # 6:  ANXIETY DISORDER, GENERALIZED (ICD-300.02) No more Alprazolam --need to check as to what is going on with [facility]. The following medications were removed from the medication list:    Alprazolam 0.5 Mg Tabs (Alprazolam) .Marland Kitchen... 1 tab by mouth three times a day for 2 weeks, then 1 tab by mouth two times a day for 2 weeks, then 1 tab by mouth daily until follow up.  Her updated medication list for this problem includes:    Celexa 40 Mg Tabs (Citalopram hydrobromide) .Marland Kitchen... Take 1 tablet by mouth once a day   Problem # 7:  COCAINE ABUSE (ICD-305.60) Will get records from [facility]--pt. with hx of polysubstance abuse in past. Not clear if pt. being up front with this.  Complete Medication List: 1)  Calan Sr 240 Mg Cr-tabs (Verapamil hcl) .... Take one (1) tablet each day   gene icp calan sr 240mg  08/08 2)  Celexa 40 Mg Tabs (Citalopram hydrobromide) .... Take 1 tablet by mouth once a day 3)  Hydrocortisone 2.5 % Crea (Hydrocortisone) .... Apply to affected area  three times daily 4)  Lipitor 10 Mg Tabs (Atorvastatin calcium) .... Take two (2) tablets by mouth daily 5)  Premarin 0.625 Mg Tabs (Estrogens conjugated) .... Take one (1) tablet each day 6)  Protonix 40 Mg Tbec (Pantoprazole sodium) .... Take one (1) tablet each day   gene icp protonix 40mg  08/08 7)  Triamterene-hctz 75-50 Mg Tabs (Triamterene-hctz) .... Take one (1) tablet by mouth every day 8)  Allegra 180 Mg Tabs (Fexofenadine hcl) .Marland Kitchen.. 1 tab by mouth daily as needed congestion, cough 9)  Advair Diskus 100-50 Mcg/dose Misc (Fluticasone-salmeterol) .Marland Kitchen.. 1 inhalation  two times a day 10)  Fluticasone Propionate 50 Mcg/act Susp (Fluticasone propionate) .... 2 sprays each nostril daily  Other Orders: T-Comprehensive Metabolic Panel ([id number])   Patient Instructions: 1)  Release of information from [facility] --please write reason "for continuity of care" 2)  Follow up fasting with Dr. Delrae Alfred in 2 months   Prescriptions: FLUTICASONE PROPIONATE 50 MCG/ACT SUSP (FLUTICASONE PROPIONATE) 2 sprays each nostril daily  #1 x 11   Entered and Authorized by:   Julieanne Manson MD   Signed by:   Julieanne Manson MD on 10/25/2008   Method used:   Printed then faxed to ...       [facility] - [facility] (retail)       [address] [place], [place]  [address]       Ph: [phone]       Fax: [phone]   RxID:   [id number] ADVAIR DISKUS 100-50 MCG/DOSE MISC (FLUTICASONE-SALMETEROL) 1 inhalation two times a day  #1 x 11   Entered and Authorized by:   Julieanne Manson MD   Signed by:   Julieanne Manson MD on 10/25/2008   Method used:   Printed then faxed to ...       [facility] - Pharmac (retail)       [address] [place], [place]  [address]       Ph: [phone]       Fax: [phone]   RxID:   [id number] ALLEGRA 180 MG TABS (FEXOFENADINE HCL) 1 tab by mouth daily as needed congestion,  cough  #30 x 11   Entered and Authorized by:   Julieanne Manson MD   Signed by:   Julieanne Manson MD on 10/25/2008   Method used:   Printed then faxed to ...       [facility] - Pharmac (retail)       [address] [place], [place]  [address]       Ph: [phone]       Fax: [phone]   RxID:   [id number] TRIAMTERENE-HCTZ 75-50 MG TABS (TRIAMTERENE-HCTZ) TAKE ONE (1) TABLET BY MOUTH EVERY DAY  #30 x 11   Entered and Authorized by:   Julieanne Manson MD   Signed by:   Julieanne Manson MD on 10/25/2008   Method used:   Printed then faxed to ...       [facility] - Pharmac (retail)       [address] [place], [place]  [address]       Ph: [phone]       Fax: [phone]   RxID:   [id number] PROTONIX 40 MG TBEC (PANTOPRAZOLE SODIUM) TAKE ONE (1) TABLET EACH DAY   GENE ICP PROTONIX 40MG  08/08  #30 x 11   Entered and Authorized by:   Julieanne Manson MD   Signed by:   Julieanne Manson MD on 10/25/2008   Method used:   Printed then faxed to ...       [facility] - Pharmac (retail)       [address] [place], [place]  [address]       Ph: [phone]       Fax: [phone]   RxID:   [id number] CELEXA 40 MG  TABS (CITALOPRAM HYDROBROMIDE) Take 1  tablet by mouth once a day  #30 x 11   Entered and Authorized by:   Julieanne Manson MD   Signed by:   Julieanne Manson MD on 10/25/2008   Method used:   Printed then faxed to ...       [facility] - Pharmac (retail)       [address] [place], [place]  [address]       Ph: [phone]       Fax: [phone]   RxID:   [id number]  ] Laboratory Results   Urine Tests    Routine Urinalysis   Glucose: negative   (Normal Range: Negative) Bilirubin: negative   (Normal Range: Negative) Ketone: negative   (Normal Range: Negative) Spec. Gravity: 1.015    (Normal Range: 1.003-1.035) Blood: negative   (Normal Range: Negative) pH: 7.0   (Normal Range: 5.0-8.0) Protein: negative   (Normal Range: Negative) Urobilinogen: 0.2   (Normal Range: 0-1) Nitrite: negative   (Normal Range: Negative) Leukocyte Esterace: negative   (Normal Range: Negative)

## 2010-11-17 NOTE — Medication Information (Signed)
Summary: Tax adviser   Imported By: Arta Bruce 06/03/2008 15:54:33  _____________________________________________________________________  External Attachment:    Type:   Image     Comment:   External Document

## 2010-12-24 ENCOUNTER — Emergency Department (HOSPITAL_COMMUNITY)
Admission: EM | Admit: 2010-12-24 | Discharge: 2010-12-25 | Disposition: A | Discharge status: Other Acute Inpatient Hospital | Payer: Self-pay | Attending: Emergency Medicine | Admitting: Emergency Medicine

## 2010-12-24 DIAGNOSIS — I10 Essential (primary) hypertension: Secondary | ICD-10-CM | POA: Insufficient documentation

## 2010-12-24 DIAGNOSIS — Z8619 Personal history of other infectious and parasitic diseases: Secondary | ICD-10-CM | POA: Insufficient documentation

## 2010-12-24 DIAGNOSIS — F1910 Other psychoactive substance abuse, uncomplicated: Secondary | ICD-10-CM | POA: Insufficient documentation

## 2010-12-24 DIAGNOSIS — F411 Generalized anxiety disorder: Secondary | ICD-10-CM | POA: Insufficient documentation

## 2010-12-24 DIAGNOSIS — IMO0002 Reserved for concepts with insufficient information to code with codable children: Secondary | ICD-10-CM | POA: Insufficient documentation

## 2010-12-24 DIAGNOSIS — F1010 Alcohol abuse, uncomplicated: Secondary | ICD-10-CM | POA: Insufficient documentation

## 2010-12-24 LAB — COMPREHENSIVE METABOLIC PANEL
ALT: 17 U/L (ref 0–35)
AST: 28 U/L (ref 0–37)
Albumin: 3.9 g/dL (ref 3.5–5.2)
Alkaline Phosphatase: 78 U/L (ref 39–117)
BUN: 15 mg/dL (ref 6–23)
CO2: 26 mEq/L (ref 19–32)
Calcium: 9.5 mg/dL (ref 8.4–10.5)
Chloride: 102 mEq/L (ref 96–112)
Creatinine, Ser: 1.13 mg/dL (ref 0.4–1.2)
GFR calc Af Amer: >60 mL/min (ref >60)
GFR calc non Af Amer: 50 mL/min — ABNORMAL LOW (ref >60)
Glucose, Bld: 110 mg/dL — ABNORMAL HIGH (ref 70–99)
Potassium: 2.6 mEq/L — CL (ref 3.5–5.1)
Sodium: 138 mEq/L (ref 135–145)
Total Bilirubin: 0.6 mg/dL (ref 0.3–1.2)
Total Protein: 7.6 g/dL (ref 6.0–8.3)

## 2010-12-24 LAB — URINE MICROSCOPIC-ADD ON

## 2010-12-24 LAB — DIFFERENTIAL
Basophils Absolute: 0 10*3/uL (ref 0.0–0.1)
Basophils Relative: 0 % (ref 0–1)
Eosinophils Absolute: 0.1 10*3/uL (ref 0.0–0.7)
Eosinophils Relative: 2 % (ref 0–5)
Lymphocytes Relative: 54 % — ABNORMAL HIGH (ref 12–46)
Lymphs Abs: 4.1 10*3/uL — ABNORMAL HIGH (ref 0.7–4.0)
Monocytes Absolute: 0.7 10*3/uL (ref 0.1–1.0)
Monocytes Relative: 9 % (ref 3–12)
Neutro Abs: 2.7 10*3/uL (ref 1.7–7.7)
Neutrophils Relative %: 35 % — ABNORMAL LOW (ref 43–77)

## 2010-12-24 LAB — URINALYSIS, ROUTINE W REFLEX MICROSCOPIC
Glucose, UA: NEGATIVE mg/dL
Hgb urine dipstick: NEGATIVE
Leukocytes, UA: NEGATIVE
Nitrite: NEGATIVE
Protein, ur: 30 mg/dL — AB
Specific Gravity, Urine: 1.026 (ref 1.005–1.030)
Urobilinogen, UA: 1 mg/dL (ref 0.0–1.0)
pH: 6.5 (ref 5.0–8.0)

## 2010-12-24 LAB — RAPID URINE DRUG SCREEN, HOSP PERFORMED
Amphetamines: NOT DETECTED
Barbiturates: NOT DETECTED
Benzodiazepines: POSITIVE — AB
Cocaine: POSITIVE — AB
Opiates: NOT DETECTED
Tetrahydrocannabinol: NOT DETECTED

## 2010-12-24 LAB — CBC
HCT: 43.9 % (ref 36.0–46.0)
Hemoglobin: 14.3 g/dL (ref 12.0–15.0)
MCH: 29.2 pg (ref 26.0–34.0)
MCHC: 32.6 g/dL (ref 30.0–36.0)
MCV: 89.6 fL (ref 78.0–100.0)
Platelets: 228 10*3/uL (ref 150–400)
RBC: 4.9 MIL/uL (ref 3.87–5.11)
RDW: 14.2 % (ref 11.5–15.5)
WBC: 7.6 10*3/uL (ref 4.0–10.5)

## 2010-12-24 LAB — ETHANOL: Alcohol, Ethyl (B): <5 mg/dL (ref 0–10)

## 2010-12-25 ENCOUNTER — Inpatient Hospital Stay (HOSPITAL_COMMUNITY)
Admission: AD | Admit: 2010-12-25 | Discharge: 2010-12-31 | DRG: 897 | Disposition: A | Discharge status: Home / Self Care | Payer: PRIVATE HEALTH INSURANCE | Source: Ambulatory Visit | Attending: Psychiatry | Admitting: Psychiatry

## 2010-12-25 DIAGNOSIS — F1994 Other psychoactive substance use, unspecified with psychoactive substance-induced mood disorder: Principal | ICD-10-CM

## 2010-12-25 DIAGNOSIS — Z91199 Patient's noncompliance with other medical treatment and regimen due to unspecified reason: Secondary | ICD-10-CM

## 2010-12-25 DIAGNOSIS — I10 Essential (primary) hypertension: Secondary | ICD-10-CM

## 2010-12-25 DIAGNOSIS — F1010 Alcohol abuse, uncomplicated: Secondary | ICD-10-CM

## 2010-12-25 DIAGNOSIS — F39 Unspecified mood [affective] disorder: Secondary | ICD-10-CM

## 2010-12-25 DIAGNOSIS — I4949 Other premature depolarization: Secondary | ICD-10-CM

## 2010-12-25 DIAGNOSIS — F29 Unspecified psychosis not due to a substance or known physiological condition: Secondary | ICD-10-CM

## 2010-12-25 DIAGNOSIS — Z5900 Homelessness unspecified: Secondary | ICD-10-CM

## 2010-12-25 DIAGNOSIS — E785 Hyperlipidemia, unspecified: Secondary | ICD-10-CM

## 2010-12-25 DIAGNOSIS — J45909 Unspecified asthma, uncomplicated: Secondary | ICD-10-CM

## 2010-12-25 DIAGNOSIS — F259 Schizoaffective disorder, unspecified: Secondary | ICD-10-CM

## 2010-12-25 DIAGNOSIS — Z9119 Patient's noncompliance with other medical treatment and regimen: Secondary | ICD-10-CM

## 2010-12-25 DIAGNOSIS — F1410 Cocaine abuse, uncomplicated: Secondary | ICD-10-CM

## 2010-12-25 DIAGNOSIS — B1920 Unspecified viral hepatitis C without hepatic coma: Secondary | ICD-10-CM

## 2010-12-25 LAB — BASIC METABOLIC PANEL
BUN: 17 mg/dL (ref 6–23)
CO2: 28 mEq/L (ref 19–32)
Calcium: 9.1 mg/dL (ref 8.4–10.5)
Chloride: 103 mEq/L (ref 96–112)
Creatinine, Ser: 0.95 mg/dL (ref 0.4–1.2)
GFR calc Af Amer: >60 mL/min (ref >60)
GFR calc non Af Amer: >60 mL/min (ref >60)
Glucose, Bld: 107 mg/dL — ABNORMAL HIGH (ref 70–99)
Potassium: 3.2 mEq/L — ABNORMAL LOW (ref 3.5–5.1)
Sodium: 138 mEq/L (ref 135–145)

## 2010-12-26 DIAGNOSIS — F1420 Cocaine dependence, uncomplicated: Secondary | ICD-10-CM

## 2010-12-26 DIAGNOSIS — F259 Schizoaffective disorder, unspecified: Secondary | ICD-10-CM

## 2010-12-26 LAB — RPR: RPR Ser Ql: NONREACTIVE

## 2010-12-28 LAB — GC/CHLAMYDIA PROBE AMP, URINE
Chlamydia, Swab/Urine, PCR: NEGATIVE
GC Probe Amp, Urine: NEGATIVE

## 2010-12-30 NOTE — H&P (Addendum)
NAMEREILLY, MOLCHAN NO.:  [id number]  MEDICAL RECORD NO.:  [id number]           PATIENT TYPE:  LOCATION:  0307                          FACILITY:  [facility]  PHYSICIAN:  Anselm Jungling, MD  DATE OF BIRTH:  October 17, 1951  DATE OF ADMISSION:  12/25/2010 DATE OF DISCHARGE:                      PSYCHIATRIC ADMISSION ASSESSMENT   IDENTIFYING INFORMATION:  The patient is a 60 year old African American female well known to [employer].  She presents at admission to the [facility] emergency room complaining of substance abuse.  The patient reports requesting detox for both cocaine and alcohol and had been using a great deal frequently  PAST PSYCHIATRIC HISTORY:  The patient reports a history of schizoaffective disorder with multiple admissions to [facility] in 2010, 2011 and 2012 as well as being a patient currently at the [facility].  SOCIAL HISTORY:  The patient is essentially homeless.  She is a heavy smoker, heavy drinker and has been abusing crack cocaine and alcohol and is requesting treatment for alcohol and drug abuse.  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  FAMILY HISTORY:  Significant for heart disease in the mother and father has a history of coronary artery disease with an MI in his 50s.  She has multiple siblings with strokes.  ALCOHOL AND DRUG HISTORY:  The patient notes alcohol 2-3 beers a day, crack cocaine all day daily and pot, cannot recall first age of use but uses at least weekly.  MEDICAL HISTORY AND PRIMARY CARE PROVIDER:  The patient goes to [facility] and sees a psychiatrist at [facility].  MEDICATIONS:  Abilify unknown amount and Ativan.  ALLERGIES:  Are significant to PENICILLIN has been reported as well as DOXYCYCLINE, FLEXERIL, MONOPRIL, results unknown.  PHYSICAL EXAM:  Was done by Dr. Lorre Nick in the emergency room and was unremarkable.  The patient was  well-developed, well-nourished, well- hydrated and otherwise normal.  EKG showed some left ventricular hypertrophy.  Pertinent labs include a urine drug screen which was positive for cocaine and benzodiazepines.  Urine was also abnormal, cloudy in appearance on dipstick, small amount of bilirubin, trace ketones and total protein also abnormal.  Microscopic epithelial rare, bacteria many.  CBC white count 7.6, RBC 4.9, hemoglobin 14.3, hematocrit 43.9, neutrophils were low at 35, lymphocytes were high at 54, absolute lymphocytes were elevated at 4.1.  Alcohol level was less than 5.  CMP - sodium was 138.  The patient was hypokalemic at 2.6 with a glucose of 110.  She was supplemented with potassium chloride in the ED and otherwise normal physical exam.  MENTAL STATUS EXAM:  The patient appears disheveled, agitated and anxious, mood is depressed, affect is blunted and is requesting detox for alcohol and substance abuse.  ASSESSMENT:  AXIS I:  Alcohol and cocaine abuse.  Previous diagnosis of schizoaffective disorder. AXIS II:  Negative. AXIS III:  Noncompliant with medication.  Chronic burden of mental illness and chronic medical problems including hypertension, asthma, depression, hepatitis C, hyperlipidemia. AXIS IV:  Homeless. AXIS V:  GAF of 40.  PLAN:  The patient will be admitted.  Meds will be restarted.  Estimated length  of stay approximately 3 days.    ______________________________ Verne Spurr, PA   ______________________________ Anselm Jungling, MD    NM/MEDQ  D:  12/25/2010  T:  12/25/2010  Job:  [id number]  Electronically Signed by Geralyn Flash MD on 12/30/2010 11:29:50 AM Electronically Signed by Verne Spurr  on 01/26/2011 10:01:25 AM

## 2010-12-31 LAB — URINALYSIS, ROUTINE W REFLEX MICROSCOPIC
Bilirubin Urine: NEGATIVE
Glucose, UA: NEGATIVE mg/dL
Hgb urine dipstick: NEGATIVE
Ketones, ur: NEGATIVE mg/dL
Nitrite: NEGATIVE
Protein, ur: NEGATIVE mg/dL
Specific Gravity, Urine: 1.014 (ref 1.005–1.030)
Urobilinogen, UA: 1 mg/dL (ref 0.0–1.0)
pH: 6 (ref 5.0–8.0)

## 2010-12-31 LAB — CBC
HCT: 38.3 % (ref 36.0–46.0)
Hemoglobin: 12.5 g/dL (ref 12.0–15.0)
MCH: 28.6 pg (ref 26.0–34.0)
MCHC: 32.6 g/dL (ref 30.0–36.0)
MCV: 87.7 fL (ref 78.0–100.0)
Platelets: 255 10*3/uL (ref 150–400)
RBC: 4.36 MIL/uL (ref 3.87–5.11)
RDW: 13.9 % (ref 11.5–15.5)
WBC: 5.8 10*3/uL (ref 4.0–10.5)

## 2010-12-31 LAB — DIFFERENTIAL
Basophils Absolute: 0.1 10*3/uL (ref 0.0–0.1)
Basophils Relative: 1 % (ref 0–1)
Eosinophils Absolute: 0.2 10*3/uL (ref 0.0–0.7)
Eosinophils Relative: 3 % (ref 0–5)
Lymphocytes Relative: 36 % (ref 12–46)
Lymphs Abs: 2.1 10*3/uL (ref 0.7–4.0)
Monocytes Absolute: 0.7 10*3/uL (ref 0.1–1.0)
Monocytes Relative: 13 % — ABNORMAL HIGH (ref 3–12)
Neutro Abs: 2.7 10*3/uL (ref 1.7–7.7)
Neutrophils Relative %: 47 % (ref 43–77)

## 2010-12-31 LAB — COMPREHENSIVE METABOLIC PANEL
ALT: 17 U/L (ref 0–35)
AST: 27 U/L (ref 0–37)
Albumin: 3.8 g/dL (ref 3.5–5.2)
Alkaline Phosphatase: 101 U/L (ref 39–117)
BUN: 17 mg/dL (ref 6–23)
CO2: 26 mEq/L (ref 19–32)
Calcium: 9.8 mg/dL (ref 8.4–10.5)
Chloride: 104 mEq/L (ref 96–112)
Creatinine, Ser: 0.8 mg/dL (ref 0.4–1.2)
GFR calc Af Amer: >60 mL/min (ref >60)
GFR calc non Af Amer: >60 mL/min (ref >60)
Glucose, Bld: 103 mg/dL — ABNORMAL HIGH (ref 70–99)
Potassium: 4.3 mEq/L (ref 3.5–5.1)
Sodium: 140 mEq/L (ref 135–145)
Total Bilirubin: 0.6 mg/dL (ref 0.3–1.2)
Total Protein: 7.7 g/dL (ref 6.0–8.3)

## 2010-12-31 LAB — URINE MICROSCOPIC-ADD ON

## 2010-12-31 LAB — CK: Total CK: 96 U/L (ref 7–177)

## 2011-01-01 LAB — DIFFERENTIAL
Basophils Absolute: 0.2 10*3/uL — ABNORMAL HIGH (ref 0.0–0.1)
Basophils Relative: 5 % — ABNORMAL HIGH (ref 0–1)
Eosinophils Absolute: 0.1 10*3/uL (ref 0.0–0.7)
Eosinophils Relative: 2 % (ref 0–5)
Lymphocytes Relative: 54 % — ABNORMAL HIGH (ref 12–46)
Lymphs Abs: 2.3 10*3/uL (ref 0.7–4.0)
Monocytes Absolute: 0.4 10*3/uL (ref 0.1–1.0)
Monocytes Relative: 10 % (ref 3–12)
Neutro Abs: 1.2 10*3/uL — ABNORMAL LOW (ref 1.7–7.7)
Neutrophils Relative %: 29 % — ABNORMAL LOW (ref 43–77)

## 2011-01-01 LAB — BASIC METABOLIC PANEL
BUN: 14 mg/dL (ref 6–23)
CO2: 28 mEq/L (ref 19–32)
Calcium: 9.5 mg/dL (ref 8.4–10.5)
Chloride: 106 mEq/L (ref 96–112)
Creatinine, Ser: 0.81 mg/dL (ref 0.4–1.2)
GFR calc Af Amer: >60 mL/min (ref >60)
GFR calc non Af Amer: >60 mL/min (ref >60)
Glucose, Bld: 87 mg/dL (ref 70–99)
Potassium: 4.2 mEq/L (ref 3.5–5.1)
Sodium: 141 mEq/L (ref 135–145)

## 2011-01-01 LAB — CBC
HCT: 37.4 % (ref 36.0–46.0)
Hemoglobin: 12.6 g/dL (ref 12.0–15.0)
MCH: 31.5 pg (ref 26.0–34.0)
MCHC: 33.6 g/dL (ref 30.0–36.0)
MCV: 93.8 fL (ref 78.0–100.0)
Platelets: 279 10*3/uL (ref 150–400)
RBC: 3.98 MIL/uL (ref 3.87–5.11)
RDW: 14 % (ref 11.5–15.5)
WBC: 4.3 10*3/uL (ref 4.0–10.5)

## 2011-01-01 LAB — TROPONIN I: Troponin I: 0.02 ng/mL (ref 0.00–0.06)

## 2011-01-01 LAB — CK TOTAL AND CKMB (NOT AT ARMC)
CK, MB: 3.3 ng/mL (ref 0.3–4.0)
Relative Index: INVALID (ref 0.0–2.5)
Total CK: 79 U/L (ref 7–177)

## 2011-01-02 LAB — DIFFERENTIAL
Basophils Absolute: 0.1 10*3/uL (ref 0.0–0.1)
Basophils Absolute: 0 10*3/uL (ref 0.0–0.1)
Basophils Absolute: 0.2 10*3/uL — ABNORMAL HIGH (ref 0.0–0.1)
Basophils Relative: 1 % (ref 0–1)
Basophils Relative: 1 % (ref 0–1)
Basophils Relative: 3 % — ABNORMAL HIGH (ref 0–1)
Eosinophils Absolute: 0.1 10*3/uL (ref 0.0–0.7)
Eosinophils Absolute: 0.1 10*3/uL (ref 0.0–0.7)
Eosinophils Absolute: 0.1 10*3/uL (ref 0.0–0.7)
Eosinophils Relative: 2 % (ref 0–5)
Eosinophils Relative: 2 % (ref 0–5)
Eosinophils Relative: 2 % (ref 0–5)
Lymphocytes Relative: 49 % — ABNORMAL HIGH (ref 12–46)
Lymphocytes Relative: 31 % (ref 12–46)
Lymphocytes Relative: 52 % — ABNORMAL HIGH (ref 12–46)
Lymphs Abs: 3.3 10*3/uL (ref 0.7–4.0)
Lymphs Abs: 1.7 10*3/uL (ref 0.7–4.0)
Lymphs Abs: 3 10*3/uL (ref 0.7–4.0)
Monocytes Absolute: 0.6 10*3/uL (ref 0.1–1.0)
Monocytes Absolute: 0.5 10*3/uL (ref 0.1–1.0)
Monocytes Absolute: 0.8 10*3/uL (ref 0.1–1.0)
Monocytes Relative: 8 % (ref 3–12)
Monocytes Relative: 15 % — ABNORMAL HIGH (ref 3–12)
Monocytes Relative: 9 % (ref 3–12)
Neutro Abs: 2.8 10*3/uL (ref 1.7–7.7)
Neutro Abs: 2 10*3/uL (ref 1.7–7.7)
Neutro Abs: 2.8 10*3/uL (ref 1.7–7.7)
Neutrophils Relative %: 41 % — ABNORMAL LOW (ref 43–77)
Neutrophils Relative %: 34 % — ABNORMAL LOW (ref 43–77)
Neutrophils Relative %: 52 % (ref 43–77)

## 2011-01-02 LAB — CBC
HCT: 35.7 % — ABNORMAL LOW (ref 36.0–46.0)
HCT: 34 % — ABNORMAL LOW (ref 36.0–46.0)
HCT: 41.5 % (ref 36.0–46.0)
Hemoglobin: 12.1 g/dL (ref 12.0–15.0)
Hemoglobin: 11.7 g/dL — ABNORMAL LOW (ref 12.0–15.0)
Hemoglobin: 13.8 g/dL (ref 12.0–15.0)
MCH: 31.4 pg (ref 26.0–34.0)
MCH: 31.1 pg (ref 26.0–34.0)
MCHC: 33.8 g/dL (ref 30.0–36.0)
MCHC: 33.3 g/dL (ref 30.0–36.0)
MCHC: 34.5 g/dL (ref 30.0–36.0)
MCV: 92.8 fL (ref 78.0–100.0)
MCV: 91.8 fL (ref 78.0–100.0)
MCV: 93.5 fL (ref 78.0–100.0)
Platelets: 322 10*3/uL (ref 150–400)
Platelets: 170 10*3/uL (ref 150–400)
Platelets: 320 10*3/uL (ref 150–400)
RBC: 3.85 MIL/uL — ABNORMAL LOW (ref 3.87–5.11)
RBC: 3.7 MIL/uL — ABNORMAL LOW (ref 3.87–5.11)
RBC: 4.43 MIL/uL (ref 3.87–5.11)
RDW: 14.1 % (ref 11.5–15.5)
RDW: 13 % (ref 11.5–15.5)
RDW: 15.2 % (ref 11.5–15.5)
WBC: 6.8 10*3/uL (ref 4.0–10.5)
WBC: 5.4 10*3/uL (ref 4.0–10.5)
WBC: 5.8 10*3/uL (ref 4.0–10.5)

## 2011-01-02 LAB — BASIC METABOLIC PANEL
BUN: 14 mg/dL (ref 6–23)
BUN: 15 mg/dL (ref 6–23)
BUN: 3 mg/dL — ABNORMAL LOW (ref 6–23)
CO2: 26 mEq/L (ref 19–32)
CO2: 24 mEq/L (ref 19–32)
CO2: 27 mEq/L (ref 19–32)
Calcium: 9.3 mg/dL (ref 8.4–10.5)
Calcium: 10.1 mg/dL (ref 8.4–10.5)
Calcium: 9.2 mg/dL (ref 8.4–10.5)
Chloride: 109 mEq/L (ref 96–112)
Chloride: 104 mEq/L (ref 96–112)
Chloride: 112 mEq/L (ref 96–112)
Creatinine, Ser: 0.95 mg/dL (ref 0.4–1.2)
Creatinine, Ser: 0.73 mg/dL (ref 0.4–1.2)
Creatinine, Ser: 0.9 mg/dL (ref 0.4–1.2)
GFR calc Af Amer: >60 mL/min (ref >60)
GFR calc Af Amer: >60 mL/min (ref >60)
GFR calc Af Amer: >60 mL/min (ref >60)
GFR calc non Af Amer: >60 mL/min (ref >60)
GFR calc non Af Amer: >60 mL/min (ref >60)
GFR calc non Af Amer: >60 mL/min (ref >60)
Glucose, Bld: 89 mg/dL (ref 70–99)
Glucose, Bld: 101 mg/dL — ABNORMAL HIGH (ref 70–99)
Glucose, Bld: 90 mg/dL (ref 70–99)
Potassium: 3.6 mEq/L (ref 3.5–5.1)
Potassium: 3.8 mEq/L (ref 3.5–5.1)
Potassium: 4.2 mEq/L (ref 3.5–5.1)
Sodium: 142 mEq/L (ref 135–145)
Sodium: 141 mEq/L (ref 135–145)
Sodium: 142 mEq/L (ref 135–145)

## 2011-01-02 LAB — POCT CARDIAC MARKERS
CKMB, poc: 4.3 ng/mL (ref 1.0–8.0)
CKMB, poc: 2.8 ng/mL (ref 1.0–8.0)
Myoglobin, poc: 64.5 ng/mL (ref 12–200)
Myoglobin, poc: 67 ng/mL (ref 12–200)
Troponin i, poc: <0.05 ng/mL (ref 0.00–0.09)
Troponin i, poc: <0.05 ng/mL (ref 0.00–0.09)

## 2011-01-02 LAB — FOLATE: Folate: 18.6 ng/mL

## 2011-01-02 LAB — POCT TOXICOLOGY PANEL

## 2011-01-02 LAB — URINE MICROSCOPIC-ADD ON

## 2011-01-02 LAB — URINALYSIS, ROUTINE W REFLEX MICROSCOPIC
Bilirubin Urine: NEGATIVE
Glucose, UA: NEGATIVE mg/dL
Hgb urine dipstick: NEGATIVE
Ketones, ur: NEGATIVE mg/dL
Nitrite: NEGATIVE
Protein, ur: NEGATIVE mg/dL
Specific Gravity, Urine: 1.009 (ref 1.005–1.030)
Urobilinogen, UA: 0.2 mg/dL (ref 0.0–1.0)
pH: 7 (ref 5.0–8.0)

## 2011-01-02 LAB — RETICULOCYTES
RBC.: 4.2 MIL/uL (ref 3.87–5.11)
Retic Count, Absolute: 42 10*3/uL (ref 19.0–186.0)
Retic Ct Pct: 1 % (ref 0.4–3.1)

## 2011-01-02 LAB — CULTURE, RESPIRATORY W GRAM STAIN: Culture: NORMAL

## 2011-01-02 LAB — HCV RNA QUANT

## 2011-01-02 LAB — RAPID URINE DRUG SCREEN, HOSP PERFORMED
Amphetamines: NOT DETECTED
Barbiturates: NOT DETECTED
Benzodiazepines: NOT DETECTED
Cocaine: POSITIVE — AB
Opiates: NOT DETECTED
Tetrahydrocannabinol: NOT DETECTED

## 2011-01-02 LAB — ROCKY MTN SPOTTED FVR AB, IGG-BLOOD: RMSF IgG: 0.09 IV

## 2011-01-02 LAB — FERRITIN: Ferritin: 339 ng/mL — ABNORMAL HIGH (ref 10–291)

## 2011-01-02 LAB — B. BURGDORFI ANTIBODIES: B burgdorferi Ab IgG+IgM: 0.21 {ISR}

## 2011-01-02 LAB — IRON AND TIBC
Iron: 121 ug/dL (ref 42–135)
Saturation Ratios: 36 % (ref 20–55)
TIBC: 335 ug/dL (ref 250–470)
UIBC: 214 ug/dL

## 2011-01-02 LAB — EXPECTORATED SPUTUM ASSESSMENT W GRAM STAIN, RFLX TO RESP C

## 2011-01-02 LAB — HIV ANTIBODY (ROUTINE TESTING W REFLEX): HIV: NONREACTIVE

## 2011-01-02 LAB — ETHANOL
Alcohol, Ethyl (B): <5 mg/dL (ref 0–10)
Alcohol, Ethyl (B): <5 mg/dL (ref 0–10)

## 2011-01-02 LAB — VITAMIN B12: Vitamin B-12: 734 pg/mL (ref 211–911)

## 2011-01-02 LAB — ROCKY MTN SPOTTED FVR AB, IGM-BLOOD: RMSF IgM: 0.25 IV (ref 0.00–0.89)

## 2011-01-03 LAB — BASIC METABOLIC PANEL
BUN: 12 mg/dL (ref 6–23)
BUN: 15 mg/dL (ref 6–23)
BUN: 17 mg/dL (ref 6–23)
BUN: 19 mg/dL (ref 6–23)
CO2: 22 mEq/L (ref 19–32)
CO2: 24 mEq/L (ref 19–32)
CO2: 25 mEq/L (ref 19–32)
CO2: 26 mEq/L (ref 19–32)
Calcium: 9.2 mg/dL (ref 8.4–10.5)
Calcium: 9.3 mg/dL (ref 8.4–10.5)
Calcium: 9.3 mg/dL (ref 8.4–10.5)
Calcium: 9.7 mg/dL (ref 8.4–10.5)
Chloride: 105 mEq/L (ref 96–112)
Chloride: 105 mEq/L (ref 96–112)
Chloride: 106 mEq/L (ref 96–112)
Chloride: 108 mEq/L (ref 96–112)
Creatinine, Ser: 0.78 mg/dL (ref 0.4–1.2)
Creatinine, Ser: 0.81 mg/dL (ref 0.4–1.2)
Creatinine, Ser: 0.94 mg/dL (ref 0.4–1.2)
Creatinine, Ser: 1.09 mg/dL (ref 0.4–1.2)
GFR calc Af Amer: >60 mL/min (ref >60)
GFR calc Af Amer: >60 mL/min (ref >60)
GFR calc Af Amer: >60 mL/min (ref >60)
GFR calc Af Amer: >60 mL/min (ref >60)
GFR calc non Af Amer: 52 mL/min — ABNORMAL LOW (ref >60)
GFR calc non Af Amer: >60 mL/min (ref >60)
GFR calc non Af Amer: >60 mL/min (ref >60)
GFR calc non Af Amer: >60 mL/min (ref >60)
Glucose, Bld: 105 mg/dL — ABNORMAL HIGH (ref 70–99)
Glucose, Bld: 82 mg/dL (ref 70–99)
Glucose, Bld: 90 mg/dL (ref 70–99)
Glucose, Bld: 93 mg/dL (ref 70–99)
Potassium: 3.4 mEq/L — ABNORMAL LOW (ref 3.5–5.1)
Potassium: 3.6 mEq/L (ref 3.5–5.1)
Potassium: 3.6 mEq/L (ref 3.5–5.1)
Potassium: 3.7 mEq/L (ref 3.5–5.1)
Sodium: 136 mEq/L (ref 135–145)
Sodium: 136 mEq/L (ref 135–145)
Sodium: 139 mEq/L (ref 135–145)
Sodium: 140 mEq/L (ref 135–145)

## 2011-01-03 LAB — CBC
HCT: 39.7 % (ref 36.0–46.0)
HCT: 40.4 % (ref 36.0–46.0)
HCT: 41.8 % (ref 36.0–46.0)
Hemoglobin: 13.5 g/dL (ref 12.0–15.0)
Hemoglobin: 13.5 g/dL (ref 12.0–15.0)
Hemoglobin: 14 g/dL (ref 12.0–15.0)
MCH: 31.7 pg (ref 26.0–34.0)
MCHC: 33.5 g/dL (ref 30.0–36.0)
MCHC: 33.6 g/dL (ref 30.0–36.0)
MCHC: 34 g/dL (ref 30.0–36.0)
MCV: 94.1 fL (ref 78.0–100.0)
MCV: 94.2 fL (ref 78.0–100.0)
MCV: 94.3 fL (ref 78.0–100.0)
Platelets: 203 10*3/uL (ref 150–400)
Platelets: 214 10*3/uL (ref 150–400)
Platelets: 279 10*3/uL (ref 150–400)
RBC: 4.22 MIL/uL (ref 3.87–5.11)
RBC: 4.29 MIL/uL (ref 3.87–5.11)
RBC: 4.43 MIL/uL (ref 3.87–5.11)
RDW: 13.3 % (ref 11.5–15.5)
RDW: 13.3 % (ref 11.5–15.5)
RDW: 13.7 % (ref 11.5–15.5)
WBC: 6 10*3/uL (ref 4.0–10.5)
WBC: 6.3 10*3/uL (ref 4.0–10.5)
WBC: 7.8 10*3/uL (ref 4.0–10.5)

## 2011-01-03 LAB — HEPATIC FUNCTION PANEL
ALT: 13 U/L (ref 0–35)
AST: 24 U/L (ref 0–37)
Albumin: 3.8 g/dL (ref 3.5–5.2)
Alkaline Phosphatase: 76 U/L (ref 39–117)
Bilirubin, Direct: 0.2 mg/dL (ref 0.0–0.3)
Indirect Bilirubin: 0.5 mg/dL (ref 0.3–0.9)
Total Bilirubin: 0.7 mg/dL (ref 0.3–1.2)
Total Protein: 7.1 g/dL (ref 6.0–8.3)

## 2011-01-03 LAB — URINALYSIS, ROUTINE W REFLEX MICROSCOPIC
Bilirubin Urine: NEGATIVE
Glucose, UA: NEGATIVE mg/dL
Ketones, ur: NEGATIVE mg/dL
Nitrite: NEGATIVE
Protein, ur: NEGATIVE mg/dL
Specific Gravity, Urine: 1.018 (ref 1.005–1.030)
Urobilinogen, UA: 0.2 mg/dL (ref 0.0–1.0)
pH: 6 (ref 5.0–8.0)

## 2011-01-03 LAB — CK TOTAL AND CKMB (NOT AT ARMC)
CK, MB: 2.9 ng/mL (ref 0.3–4.0)
Relative Index: 1.9 (ref 0.0–2.5)
Total CK: 151 U/L (ref 7–177)

## 2011-01-03 LAB — RAPID URINE DRUG SCREEN, HOSP PERFORMED
Amphetamines: NOT DETECTED
Barbiturates: NOT DETECTED
Benzodiazepines: POSITIVE — AB
Cocaine: POSITIVE — AB
Opiates: NOT DETECTED
Tetrahydrocannabinol: NOT DETECTED

## 2011-01-03 LAB — DIFFERENTIAL
Basophils Absolute: 0 10*3/uL (ref 0.0–0.1)
Basophils Absolute: 0.1 10*3/uL (ref 0.0–0.1)
Basophils Relative: 1 % (ref 0–1)
Basophils Relative: 1 % (ref 0–1)
Eosinophils Absolute: 0.1 10*3/uL (ref 0.0–0.7)
Eosinophils Absolute: 0.2 10*3/uL (ref 0.0–0.7)
Eosinophils Relative: 2 % (ref 0–5)
Eosinophils Relative: 3 % (ref 0–5)
Lymphocytes Relative: 49 % — ABNORMAL HIGH (ref 12–46)
Lymphocytes Relative: 56 % — ABNORMAL HIGH (ref 12–46)
Lymphs Abs: 3 10*3/uL (ref 0.7–4.0)
Lymphs Abs: 4.4 10*3/uL — ABNORMAL HIGH (ref 0.7–4.0)
Monocytes Absolute: 0.6 10*3/uL (ref 0.1–1.0)
Monocytes Absolute: 0.8 10*3/uL (ref 0.1–1.0)
Monocytes Relative: 10 % (ref 3–12)
Monocytes Relative: 10 % (ref 3–12)
Neutro Abs: 2.4 10*3/uL (ref 1.7–7.7)
Neutro Abs: 2.5 10*3/uL (ref 1.7–7.7)
Neutrophils Relative %: 31 % — ABNORMAL LOW (ref 43–77)
Neutrophils Relative %: 39 % — ABNORMAL LOW (ref 43–77)

## 2011-01-03 LAB — MAGNESIUM: Magnesium: 2.1 mg/dL (ref 1.5–2.5)

## 2011-01-03 LAB — LIPID PANEL
Cholesterol: 205 mg/dL — ABNORMAL HIGH (ref 0–200)
HDL: 67 mg/dL (ref >39)
LDL Cholesterol: 128 mg/dL — ABNORMAL HIGH (ref 0–99)
Total CHOL/HDL Ratio: 3.1 RATIO
Triglycerides: 51 mg/dL (ref <150)
VLDL: 10 mg/dL (ref 0–40)

## 2011-01-03 LAB — POCT CARDIAC MARKERS
CKMB, poc: 2.6 ng/mL (ref 1.0–8.0)
Myoglobin, poc: 78 ng/mL (ref 12–200)
Troponin i, poc: <0.05 ng/mL (ref 0.00–0.09)

## 2011-01-03 LAB — URINE MICROSCOPIC-ADD ON

## 2011-01-03 LAB — CARDIAC PANEL(CRET KIN+CKTOT+MB+TROPI)
CK, MB: 2 ng/mL (ref 0.3–4.0)
CK, MB: 2.4 ng/mL (ref 0.3–4.0)
Relative Index: 1.5 (ref 0.0–2.5)
Relative Index: 1.8 (ref 0.0–2.5)
Total CK: 130 U/L (ref 7–177)
Total CK: 133 U/L (ref 7–177)
Troponin I: 0.02 ng/mL (ref 0.00–0.06)
Troponin I: 0.03 ng/mL (ref 0.00–0.06)

## 2011-01-03 LAB — TROPONIN I: Troponin I: 0.02 ng/mL (ref 0.00–0.06)

## 2011-01-03 LAB — URINE CULTURE: Colony Count: >=100000

## 2011-01-03 LAB — PHOSPHORUS: Phosphorus: 4.6 mg/dL (ref 2.3–4.6)

## 2011-01-03 LAB — ETHANOL: Alcohol, Ethyl (B): <5 mg/dL (ref 0–10)

## 2011-01-04 LAB — CBC
HCT: 38.9 % (ref 36.0–46.0)
HCT: 40.6 % (ref 36.0–46.0)
Hemoglobin: 12.8 g/dL (ref 12.0–15.0)
Hemoglobin: 13.8 g/dL (ref 12.0–15.0)
MCHC: 32.9 g/dL (ref 30.0–36.0)
MCHC: 34 g/dL (ref 30.0–36.0)
MCV: 94.6 fL (ref 78.0–100.0)
MCV: 95.2 fL (ref 78.0–100.0)
Platelets: 238 10*3/uL (ref 150–400)
Platelets: 244 10*3/uL (ref 150–400)
RBC: 4.09 MIL/uL (ref 3.87–5.11)
RBC: 4.3 MIL/uL (ref 3.87–5.11)
RDW: 13.6 % (ref 11.5–15.5)
RDW: 14.4 % (ref 11.5–15.5)
WBC: 5.5 10*3/uL (ref 4.0–10.5)
WBC: 5.7 10*3/uL (ref 4.0–10.5)

## 2011-01-04 LAB — RAPID URINE DRUG SCREEN, HOSP PERFORMED
Amphetamines: NOT DETECTED
Amphetamines: NOT DETECTED
Barbiturates: NOT DETECTED
Barbiturates: NOT DETECTED
Benzodiazepines: POSITIVE — AB
Benzodiazepines: POSITIVE — AB
Cocaine: NOT DETECTED
Cocaine: POSITIVE — AB
Opiates: NOT DETECTED
Opiates: NOT DETECTED
Tetrahydrocannabinol: NOT DETECTED
Tetrahydrocannabinol: NOT DETECTED

## 2011-01-04 LAB — BASIC METABOLIC PANEL
BUN: 14 mg/dL (ref 6–23)
BUN: 9 mg/dL (ref 6–23)
CO2: 22 mEq/L (ref 19–32)
CO2: 28 mEq/L (ref 19–32)
Calcium: 9.2 mg/dL (ref 8.4–10.5)
Calcium: 9.3 mg/dL (ref 8.4–10.5)
Chloride: 109 mEq/L (ref 96–112)
Chloride: 109 mEq/L (ref 96–112)
Creatinine, Ser: 0.79 mg/dL (ref 0.4–1.2)
Creatinine, Ser: 0.9 mg/dL (ref 0.4–1.2)
GFR calc Af Amer: >60 mL/min (ref >60)
GFR calc Af Amer: >60 mL/min (ref >60)
GFR calc non Af Amer: >60 mL/min (ref >60)
GFR calc non Af Amer: >60 mL/min (ref >60)
Glucose, Bld: 104 mg/dL — ABNORMAL HIGH (ref 70–99)
Glucose, Bld: 130 mg/dL — ABNORMAL HIGH (ref 70–99)
Potassium: 2.8 mEq/L — ABNORMAL LOW (ref 3.5–5.1)
Potassium: 3 mEq/L — ABNORMAL LOW (ref 3.5–5.1)
Sodium: 141 mEq/L (ref 135–145)
Sodium: 141 mEq/L (ref 135–145)

## 2011-01-04 LAB — DIFFERENTIAL
Basophils Absolute: 0 10*3/uL (ref 0.0–0.1)
Basophils Absolute: 0 10*3/uL (ref 0.0–0.1)
Basophils Relative: 0 % (ref 0–1)
Basophils Relative: 1 % (ref 0–1)
Eosinophils Absolute: 0.2 10*3/uL (ref 0.0–0.7)
Eosinophils Absolute: 0.2 10*3/uL (ref 0.0–0.7)
Eosinophils Relative: 3 % (ref 0–5)
Eosinophils Relative: 4 % (ref 0–5)
Lymphocytes Relative: 38 % (ref 12–46)
Lymphocytes Relative: 47 % — ABNORMAL HIGH (ref 12–46)
Lymphs Abs: 2.2 10*3/uL (ref 0.7–4.0)
Lymphs Abs: 2.6 10*3/uL (ref 0.7–4.0)
Monocytes Absolute: 0.5 10*3/uL (ref 0.1–1.0)
Monocytes Absolute: 0.6 10*3/uL (ref 0.1–1.0)
Monocytes Relative: 11 % (ref 3–12)
Monocytes Relative: 8 % (ref 3–12)
Neutro Abs: 2 10*3/uL (ref 1.7–7.7)
Neutro Abs: 2.9 10*3/uL (ref 1.7–7.7)
Neutrophils Relative %: 37 % — ABNORMAL LOW (ref 43–77)
Neutrophils Relative %: 51 % (ref 43–77)

## 2011-01-04 LAB — ETHANOL
Alcohol, Ethyl (B): 5 mg/dL (ref 0–10)
Alcohol, Ethyl (B): <5 mg/dL (ref 0–10)

## 2011-01-05 LAB — CBC
HCT: 37.6 % (ref 36.0–46.0)
Hemoglobin: 12.6 g/dL (ref 12.0–15.0)
MCHC: 33.5 g/dL (ref 30.0–36.0)
MCV: 94.5 fL (ref 78.0–100.0)
Platelets: 240 10*3/uL (ref 150–400)
RBC: 3.98 MIL/uL (ref 3.87–5.11)
RDW: 13.7 % (ref 11.5–15.5)
WBC: 7.7 10*3/uL (ref 4.0–10.5)

## 2011-01-05 LAB — POCT I-STAT, CHEM 8
BUN: 23 mg/dL (ref 6–23)
BUN: 35 mg/dL — ABNORMAL HIGH (ref 6–23)
Calcium, Ion: 1.17 mmol/L (ref 1.12–1.32)
Calcium, Ion: 1.18 mmol/L (ref 1.12–1.32)
Chloride: 109 mEq/L (ref 96–112)
Chloride: 113 mEq/L — ABNORMAL HIGH (ref 96–112)
Creatinine, Ser: 1 mg/dL (ref 0.4–1.2)
Creatinine, Ser: 1.2 mg/dL (ref 0.4–1.2)
Glucose, Bld: 168 mg/dL — ABNORMAL HIGH (ref 70–99)
Glucose, Bld: 97 mg/dL (ref 70–99)
HCT: 38 % (ref 36.0–46.0)
HCT: 42 % (ref 36.0–46.0)
Hemoglobin: 12.9 g/dL (ref 12.0–15.0)
Hemoglobin: 14.3 g/dL (ref 12.0–15.0)
Potassium: 3.2 mEq/L — ABNORMAL LOW (ref 3.5–5.1)
Potassium: 4.1 mEq/L (ref 3.5–5.1)
Sodium: 137 mEq/L (ref 135–145)
Sodium: 140 mEq/L (ref 135–145)
TCO2: 18 mmol/L (ref 0–100)
TCO2: 19 mmol/L (ref 0–100)

## 2011-01-05 LAB — URINALYSIS, ROUTINE W REFLEX MICROSCOPIC
Glucose, UA: NEGATIVE mg/dL
Ketones, ur: NEGATIVE mg/dL
Nitrite: NEGATIVE
Protein, ur: 100 mg/dL — AB
Specific Gravity, Urine: 1.025 (ref 1.005–1.030)
Urobilinogen, UA: 1 mg/dL (ref 0.0–1.0)
pH: 5 (ref 5.0–8.0)

## 2011-01-05 LAB — COMPREHENSIVE METABOLIC PANEL
ALT: 28 U/L (ref 0–35)
AST: 47 U/L — ABNORMAL HIGH (ref 0–37)
Albumin: 3.6 g/dL (ref 3.5–5.2)
Alkaline Phosphatase: 77 U/L (ref 39–117)
BUN: 11 mg/dL (ref 6–23)
CO2: 25 mEq/L (ref 19–32)
Calcium: 9.3 mg/dL (ref 8.4–10.5)
Chloride: 103 mEq/L (ref 96–112)
Creatinine, Ser: 1.39 mg/dL — ABNORMAL HIGH (ref 0.4–1.2)
GFR calc Af Amer: 47 mL/min — ABNORMAL LOW (ref >60)
GFR calc non Af Amer: 39 mL/min — ABNORMAL LOW (ref >60)
Glucose, Bld: 110 mg/dL — ABNORMAL HIGH (ref 70–99)
Potassium: 3.7 mEq/L (ref 3.5–5.1)
Sodium: 139 mEq/L (ref 135–145)
Total Bilirubin: 0.9 mg/dL (ref 0.3–1.2)
Total Protein: 6.8 g/dL (ref 6.0–8.3)

## 2011-01-05 LAB — URINE MICROSCOPIC-ADD ON

## 2011-01-05 LAB — DIFFERENTIAL
Basophils Absolute: 0 10*3/uL (ref 0.0–0.1)
Basophils Relative: 1 % (ref 0–1)
Eosinophils Absolute: 0.1 10*3/uL (ref 0.0–0.7)
Eosinophils Relative: 2 % (ref 0–5)
Lymphocytes Relative: 38 % (ref 12–46)
Lymphs Abs: 2.9 10*3/uL (ref 0.7–4.0)
Monocytes Absolute: 0.8 10*3/uL (ref 0.1–1.0)
Monocytes Relative: 11 % (ref 3–12)
Neutro Abs: 3.8 10*3/uL (ref 1.7–7.7)
Neutrophils Relative %: 49 % (ref 43–77)

## 2011-01-05 LAB — LIPASE, BLOOD: Lipase: 61 U/L — ABNORMAL HIGH (ref 11–59)

## 2011-01-05 LAB — ETHANOL: Alcohol, Ethyl (B): <5 mg/dL (ref 0–10)

## 2011-01-06 NOTE — Discharge Summary (Signed)
  NAMENECHUMA, BOVEN NO.:  [id number]  MEDICAL RECORD NO.:  [id number]           PATIENT TYPE:  I  LOCATION:  0307                          FACILITY:  [facility]  PHYSICIAN:  Anselm Jungling, MD  DATE OF BIRTH:  1951-01-02  DATE OF ADMISSION:  12/25/2010 DATE OF DISCHARGE:  12/31/2010                              DISCHARGE SUMMARY   IDENTIFYING DATA/REASON FOR ADMISSION:  This was an inpatient psychiatric admission for Danielle [name], a 60 year old female who was admitted for treatment of substance abuse and psychosis.  Please refer to the admission note for further details pertaining to the symptoms, circumstances, and history that led to her hospitalization.  She was given initial Axis I diagnoses of cocaine abuse and history of schizophrenia.  MEDICAL AND LABORATORY:  The patient was medically and physically assessed by the psychiatric nurse practitioner.  She came to [place] with a history of hypertension and hyperlipidemia.  She was continued on her usual regimen of Norvasc, Zocor, aspirin, potassium chloride, and, for GERD, Protonix.  She was also continued on her usual Premarin supplemental hormone.  There were no significant medical issues during her stay.  HOSPITAL COURSE:  The patient was admitted to the adult inpatient psychiatric service.  She presented as a well-nourished, normally- developed adult female who appeared to be nonpsychotic but with depressed mood and grim affect.  Some of her appearance of depression and lethargy was consistent with her post-cocaine-binge status.  She expressed a strong desire for help and was absent suicidal ideation throughout her stay.  She was homeless at the time of her admission, and she worked closely with case management towards an aftercare plan that could address her needs for residential support as well as a mental health and substance abuse aftercare.  Her participation was better after the first day, during which  she spent most of time in bed.  She encouraged [place] to contact her daughter, who was supportive, and likewise interested in seeing Danielle [name] have a living situation other than being on the street.  The patient was appropriate for discharge on the 6th hospital day.  She had been absent suicidal ideation and remained nonpsychotic throughout her stay.  She was apparently stable on medications, which included restarting Abilify 15 mg q.h.s. and trazodone for sleep.  She agreed to the following aftercare plan.  AFTERCARE:  The patient was to follow up at [facility] with an appointment on March 20 at 10:00 a.m.  She was also to follow up at [facility] for her medical problems.  DISCHARGE DIAGNOSES:  AXIS I:  History of psychosis NOS, cocaine abuse, early remission. AXIS II:  Deferred. AXIS III:  History of hypertension, hyperlipidemia, GERD. AXIS IV:  Stressors:  Severe. AXIS V:  GAF on discharge 50.  The suicide risk assessment was completed upon discharge with a finding of minimal risk.     Anselm Jungling, MD     SPB/MEDQ  D:  01/05/2011  T:  01/05/2011  Job:  [id number]  Electronically Signed by Geralyn Flash MD on 01/06/2011 09:59:43 AM

## 2011-01-08 ENCOUNTER — Emergency Department (HOSPITAL_COMMUNITY)
Admission: EM | Admit: 2011-01-08 | Discharge: 2011-01-08 | Disposition: A | Discharge status: Home / Self Care | Payer: PRIVATE HEALTH INSURANCE | Attending: Emergency Medicine | Admitting: Emergency Medicine

## 2011-01-08 DIAGNOSIS — Z8659 Personal history of other mental and behavioral disorders: Secondary | ICD-10-CM | POA: Insufficient documentation

## 2011-01-08 DIAGNOSIS — R221 Localized swelling, mass and lump, neck: Secondary | ICD-10-CM | POA: Insufficient documentation

## 2011-01-08 DIAGNOSIS — R220 Localized swelling, mass and lump, head: Secondary | ICD-10-CM | POA: Insufficient documentation

## 2011-01-08 DIAGNOSIS — F341 Dysthymic disorder: Secondary | ICD-10-CM | POA: Insufficient documentation

## 2011-01-08 DIAGNOSIS — R509 Fever, unspecified: Secondary | ICD-10-CM | POA: Insufficient documentation

## 2011-01-08 DIAGNOSIS — Z8619 Personal history of other infectious and parasitic diseases: Secondary | ICD-10-CM | POA: Insufficient documentation

## 2011-01-08 DIAGNOSIS — E785 Hyperlipidemia, unspecified: Secondary | ICD-10-CM | POA: Insufficient documentation

## 2011-01-08 DIAGNOSIS — R209 Unspecified disturbances of skin sensation: Secondary | ICD-10-CM | POA: Insufficient documentation

## 2011-01-08 DIAGNOSIS — K1370 Unspecified lesions of oral mucosa: Secondary | ICD-10-CM | POA: Insufficient documentation

## 2011-01-08 DIAGNOSIS — K029 Dental caries, unspecified: Secondary | ICD-10-CM | POA: Insufficient documentation

## 2011-01-08 DIAGNOSIS — K089 Disorder of teeth and supporting structures, unspecified: Secondary | ICD-10-CM | POA: Insufficient documentation

## 2011-01-08 DIAGNOSIS — J45909 Unspecified asthma, uncomplicated: Secondary | ICD-10-CM | POA: Insufficient documentation

## 2011-01-08 DIAGNOSIS — I10 Essential (primary) hypertension: Secondary | ICD-10-CM | POA: Insufficient documentation

## 2011-01-08 LAB — DIFFERENTIAL
Basophils Absolute: 0 10*3/uL (ref 0.0–0.1)
Basophils Relative: 0 % (ref 0–1)
Eosinophils Absolute: 0 10*3/uL (ref 0.0–0.7)
Eosinophils Relative: 1 % (ref 0–5)
Lymphocytes Relative: 36 % (ref 12–46)
Lymphs Abs: 2 10*3/uL (ref 0.7–4.0)
Monocytes Absolute: 0.4 10*3/uL (ref 0.1–1.0)
Monocytes Relative: 7 % (ref 3–12)
Neutro Abs: 3.2 10*3/uL (ref 1.7–7.7)
Neutrophils Relative %: 56 % (ref 43–77)

## 2011-01-08 LAB — COMPREHENSIVE METABOLIC PANEL
ALT: 15 U/L (ref 0–35)
AST: 21 U/L (ref 0–37)
Albumin: 3.8 g/dL (ref 3.5–5.2)
Alkaline Phosphatase: 92 U/L (ref 39–117)
BUN: 12 mg/dL (ref 6–23)
CO2: 24 mEq/L (ref 19–32)
Calcium: 9.1 mg/dL (ref 8.4–10.5)
Chloride: 102 mEq/L (ref 96–112)
Creatinine, Ser: 0.71 mg/dL (ref 0.4–1.2)
GFR calc Af Amer: >60 mL/min (ref >60)
GFR calc non Af Amer: >60 mL/min (ref >60)
Glucose, Bld: 83 mg/dL (ref 70–99)
Potassium: 3.7 mEq/L (ref 3.5–5.1)
Sodium: 138 mEq/L (ref 135–145)
Total Bilirubin: 0.7 mg/dL (ref 0.3–1.2)
Total Protein: 7.1 g/dL (ref 6.0–8.3)

## 2011-01-08 LAB — CBC
HCT: 41.9 % (ref 36.0–46.0)
Hemoglobin: 14.1 g/dL (ref 12.0–15.0)
MCHC: 33.7 g/dL (ref 30.0–36.0)
MCV: 93.3 fL (ref 78.0–100.0)
Platelets: 239 10*3/uL (ref 150–400)
RBC: 4.49 MIL/uL (ref 3.87–5.11)
RDW: 13.6 % (ref 11.5–15.5)
WBC: 5.8 10*3/uL (ref 4.0–10.5)

## 2011-01-08 LAB — RAPID URINE DRUG SCREEN, HOSP PERFORMED
Amphetamines: NOT DETECTED
Barbiturates: NOT DETECTED
Benzodiazepines: POSITIVE — AB
Cocaine: NOT DETECTED
Opiates: POSITIVE — AB
Tetrahydrocannabinol: NOT DETECTED

## 2011-01-08 LAB — ETHANOL: Alcohol, Ethyl (B): <5 mg/dL (ref 0–10)

## 2011-01-08 LAB — LIPASE, BLOOD: Lipase: 40 U/L (ref 11–59)

## 2011-01-08 LAB — CK: Total CK: 104 U/L (ref 7–177)

## 2011-01-18 LAB — CULTURE, BLOOD (ROUTINE X 2)
Culture: NO GROWTH
Culture: NO GROWTH

## 2011-01-18 LAB — DIFFERENTIAL
Basophils Absolute: 0 10*3/uL (ref 0.0–0.1)
Basophils Relative: 0 % (ref 0–1)
Eosinophils Absolute: 0 10*3/uL (ref 0.0–0.7)
Eosinophils Relative: 0 % (ref 0–5)
Lymphocytes Relative: 5 % — ABNORMAL LOW (ref 12–46)
Lymphs Abs: 0.6 10*3/uL — ABNORMAL LOW (ref 0.7–4.0)
Monocytes Absolute: 0.6 10*3/uL (ref 0.1–1.0)
Monocytes Relative: 4 % (ref 3–12)
Neutro Abs: 12.1 10*3/uL — ABNORMAL HIGH (ref 1.7–7.7)
Neutrophils Relative %: 91 % — ABNORMAL HIGH (ref 43–77)

## 2011-01-18 LAB — COMPREHENSIVE METABOLIC PANEL
ALT: 17 U/L (ref 0–35)
AST: 29 U/L (ref 0–37)
Albumin: 3.8 g/dL (ref 3.5–5.2)
Alkaline Phosphatase: 73 U/L (ref 39–117)
BUN: 11 mg/dL (ref 6–23)
CO2: 21 mEq/L (ref 19–32)
Calcium: 9.3 mg/dL (ref 8.4–10.5)
Chloride: 105 mEq/L (ref 96–112)
Creatinine, Ser: 0.84 mg/dL (ref 0.4–1.2)
GFR calc Af Amer: >60 mL/min (ref >60)
GFR calc non Af Amer: >60 mL/min (ref >60)
Glucose, Bld: 134 mg/dL — ABNORMAL HIGH (ref 70–99)
Potassium: 3 mEq/L — ABNORMAL LOW (ref 3.5–5.1)
Sodium: 136 mEq/L (ref 135–145)
Total Bilirubin: 0.4 mg/dL (ref 0.3–1.2)
Total Protein: 7.4 g/dL (ref 6.0–8.3)

## 2011-01-18 LAB — URINE CULTURE: Colony Count: 9000

## 2011-01-18 LAB — RAPID URINE DRUG SCREEN, HOSP PERFORMED
Amphetamines: NOT DETECTED
Barbiturates: NOT DETECTED
Benzodiazepines: POSITIVE — AB
Cocaine: NOT DETECTED
Opiates: NOT DETECTED
Tetrahydrocannabinol: NOT DETECTED

## 2011-01-18 LAB — URINE MICROSCOPIC-ADD ON

## 2011-01-18 LAB — URINALYSIS, ROUTINE W REFLEX MICROSCOPIC
Bilirubin Urine: NEGATIVE
Glucose, UA: NEGATIVE mg/dL
Ketones, ur: NEGATIVE mg/dL
Nitrite: NEGATIVE
Protein, ur: NEGATIVE mg/dL
Specific Gravity, Urine: 1.021 (ref 1.005–1.030)
Urobilinogen, UA: 0.2 mg/dL (ref 0.0–1.0)
pH: 5 (ref 5.0–8.0)

## 2011-01-18 LAB — CBC
HCT: 37.7 % (ref 36.0–46.0)
Hemoglobin: 13.1 g/dL (ref 12.0–15.0)
MCHC: 34.8 g/dL (ref 30.0–36.0)
MCV: 90.7 fL (ref 78.0–100.0)
Platelets: 195 10*3/uL (ref 150–400)
RBC: 4.15 MIL/uL (ref 3.87–5.11)
RDW: 14.9 % (ref 11.5–15.5)
WBC: 13.4 10*3/uL — ABNORMAL HIGH (ref 4.0–10.5)

## 2011-01-23 LAB — POCT CARDIAC MARKERS
CKMB, poc: 1.4 ng/mL (ref 1.0–8.0)
Myoglobin, poc: 53 ng/mL (ref 12–200)
Troponin i, poc: <0.05 ng/mL (ref 0.00–0.09)

## 2011-01-23 LAB — HEPATIC FUNCTION PANEL
ALT: 14 U/L (ref 0–35)
AST: 22 U/L (ref 0–37)
Albumin: 3.6 g/dL (ref 3.5–5.2)
Alkaline Phosphatase: 62 U/L (ref 39–117)
Bilirubin, Direct: <0.1 mg/dL (ref 0.0–0.3)
Total Bilirubin: 0.4 mg/dL (ref 0.3–1.2)
Total Protein: 7.3 g/dL (ref 6.0–8.3)

## 2011-01-23 LAB — DIFFERENTIAL
Basophils Absolute: 0.1 10*3/uL (ref 0.0–0.1)
Basophils Relative: 2 % — ABNORMAL HIGH (ref 0–1)
Eosinophils Absolute: 0.2 10*3/uL (ref 0.0–0.7)
Eosinophils Relative: 3 % (ref 0–5)
Lymphocytes Relative: 40 % (ref 12–46)
Lymphs Abs: 2.5 10*3/uL (ref 0.7–4.0)
Monocytes Absolute: 0.5 10*3/uL (ref 0.1–1.0)
Monocytes Relative: 9 % (ref 3–12)
Neutro Abs: 2.9 10*3/uL (ref 1.7–7.7)
Neutrophils Relative %: 47 % (ref 43–77)

## 2011-01-23 LAB — CBC
HCT: 43 % (ref 36.0–46.0)
Hemoglobin: 12.7 g/dL (ref 12.0–15.0)
MCHC: 29.5 g/dL — ABNORMAL LOW (ref 30.0–36.0)
MCV: 92.4 fL (ref 78.0–100.0)
Platelets: 302 10*3/uL (ref 150–400)
RBC: 4.66 MIL/uL (ref 3.87–5.11)
RDW: 14.6 % (ref 11.5–15.5)
WBC: 6.2 10*3/uL (ref 4.0–10.5)

## 2011-01-23 LAB — POCT I-STAT, CHEM 8
BUN: 18 mg/dL (ref 6–23)
Calcium, Ion: 1.22 mmol/L (ref 1.12–1.32)
Chloride: 108 mEq/L (ref 96–112)
Creatinine, Ser: 1.1 mg/dL (ref 0.4–1.2)
Glucose, Bld: 99 mg/dL (ref 70–99)
HCT: 39 % (ref 36.0–46.0)
Hemoglobin: 13.3 g/dL (ref 12.0–15.0)
Potassium: 3.7 mEq/L (ref 3.5–5.1)
Sodium: 141 mEq/L (ref 135–145)
TCO2: 23 mmol/L (ref 0–100)

## 2011-01-23 LAB — LIPASE, BLOOD: Lipase: 48 U/L (ref 11–59)

## 2011-01-26 LAB — POCT PREGNANCY, URINE: Preg Test, Ur: NEGATIVE

## 2011-01-26 LAB — COMPREHENSIVE METABOLIC PANEL
ALT: 18 U/L (ref 0–35)
ALT: 11 U/L (ref 0–35)
ALT: 5 U/L (ref 0–35)
AST: 22 U/L (ref 0–37)
AST: 23 U/L (ref 0–37)
AST: 24 U/L (ref 0–37)
Albumin: 4.1 g/dL (ref 3.5–5.2)
Albumin: 4.2 g/dL (ref 3.5–5.2)
Albumin: 4.5 g/dL (ref 3.5–5.2)
Alkaline Phosphatase: 67 U/L (ref 39–117)
Alkaline Phosphatase: 86 U/L (ref 39–117)
Alkaline Phosphatase: 98 U/L (ref 39–117)
BUN: 17 mg/dL (ref 6–23)
BUN: 20 mg/dL (ref 6–23)
BUN: 21 mg/dL (ref 6–23)
CO2: 25 mEq/L (ref 19–32)
CO2: 25 mEq/L (ref 19–32)
CO2: 27 mEq/L (ref 19–32)
Calcium: 10.4 mg/dL (ref 8.4–10.5)
Calcium: 10.1 mg/dL (ref 8.4–10.5)
Calcium: 9.9 mg/dL (ref 8.4–10.5)
Chloride: 105 mEq/L (ref 96–112)
Chloride: 102 mEq/L (ref 96–112)
Chloride: 102 mEq/L (ref 96–112)
Creatinine, Ser: 1.04 mg/dL (ref 0.4–1.2)
Creatinine, Ser: 1.2 mg/dL (ref 0.4–1.2)
Creatinine, Ser: 1.3 mg/dL — ABNORMAL HIGH (ref 0.4–1.2)
GFR calc Af Amer: >60 mL/min (ref >60)
GFR calc Af Amer: 51 mL/min — ABNORMAL LOW (ref >60)
GFR calc Af Amer: 56 mL/min — ABNORMAL LOW (ref >60)
GFR calc non Af Amer: 55 mL/min — ABNORMAL LOW (ref >60)
GFR calc non Af Amer: 43 mL/min — ABNORMAL LOW (ref >60)
GFR calc non Af Amer: 47 mL/min — ABNORMAL LOW (ref >60)
Glucose, Bld: 114 mg/dL — ABNORMAL HIGH (ref 70–99)
Glucose, Bld: 113 mg/dL — ABNORMAL HIGH (ref 70–99)
Glucose, Bld: 87 mg/dL (ref 70–99)
Potassium: 3 mEq/L — ABNORMAL LOW (ref 3.5–5.1)
Potassium: 3.9 mEq/L (ref 3.5–5.1)
Potassium: 4.1 mEq/L (ref 3.5–5.1)
Sodium: 140 mEq/L (ref 135–145)
Sodium: 141 mEq/L (ref 135–145)
Sodium: 142 mEq/L (ref 135–145)
Total Bilirubin: 0.8 mg/dL (ref 0.3–1.2)
Total Bilirubin: 0.1 mg/dL — ABNORMAL LOW (ref 0.3–1.2)
Total Bilirubin: 0.5 mg/dL (ref 0.3–1.2)
Total Protein: 7.6 g/dL (ref 6.0–8.3)
Total Protein: 8.1 g/dL (ref 6.0–8.3)
Total Protein: 8.7 g/dL — ABNORMAL HIGH (ref 6.0–8.3)

## 2011-01-26 LAB — RAPID URINE DRUG SCREEN, HOSP PERFORMED
Amphetamines: NOT DETECTED
Barbiturates: NOT DETECTED
Benzodiazepines: POSITIVE — AB
Cocaine: POSITIVE — AB
Opiates: NOT DETECTED
Tetrahydrocannabinol: NOT DETECTED

## 2011-01-26 LAB — URINE MICROSCOPIC-ADD ON

## 2011-01-26 LAB — DIFFERENTIAL
Basophils Absolute: 0 10*3/uL (ref 0.0–0.1)
Basophils Absolute: 0 10*3/uL (ref 0.0–0.1)
Basophils Absolute: 0 10*3/uL (ref 0.0–0.1)
Basophils Relative: 1 % (ref 0–1)
Basophils Relative: 1 % (ref 0–1)
Basophils Relative: 1 % (ref 0–1)
Eosinophils Absolute: 0.1 10*3/uL (ref 0.0–0.7)
Eosinophils Absolute: 0.2 10*3/uL (ref 0.0–0.7)
Eosinophils Absolute: 0.2 10*3/uL (ref 0.0–0.7)
Eosinophils Relative: 1 % (ref 0–5)
Eosinophils Relative: 3 % (ref 0–5)
Eosinophils Relative: 4 % (ref 0–5)
Lymphocytes Relative: 41 % (ref 12–46)
Lymphocytes Relative: 37 % (ref 12–46)
Lymphocytes Relative: 52 % — ABNORMAL HIGH (ref 12–46)
Lymphs Abs: 3.3 10*3/uL (ref 0.7–4.0)
Lymphs Abs: 2 10*3/uL (ref 0.7–4.0)
Lymphs Abs: 2.5 10*3/uL (ref 0.7–4.0)
Monocytes Absolute: 0.6 10*3/uL (ref 0.1–1.0)
Monocytes Absolute: 0.5 10*3/uL (ref 0.1–1.0)
Monocytes Absolute: 0.6 10*3/uL (ref 0.1–1.0)
Monocytes Relative: 8 % (ref 3–12)
Monocytes Relative: 11 % (ref 3–12)
Monocytes Relative: 9 % (ref 3–12)
Neutro Abs: 4.2 10*3/uL (ref 1.7–7.7)
Neutro Abs: 1.6 10*3/uL — ABNORMAL LOW (ref 1.7–7.7)
Neutro Abs: 2.6 10*3/uL (ref 1.7–7.7)
Neutrophils Relative %: 51 % (ref 43–77)
Neutrophils Relative %: 32 % — ABNORMAL LOW (ref 43–77)
Neutrophils Relative %: 49 % (ref 43–77)

## 2011-01-26 LAB — URINALYSIS, ROUTINE W REFLEX MICROSCOPIC
Bilirubin Urine: NEGATIVE
Bilirubin Urine: NEGATIVE
Bilirubin Urine: NEGATIVE
Glucose, UA: NEGATIVE mg/dL
Glucose, UA: NEGATIVE mg/dL
Glucose, UA: NEGATIVE mg/dL
Hgb urine dipstick: NEGATIVE
Hgb urine dipstick: NEGATIVE
Hgb urine dipstick: NEGATIVE
Ketones, ur: NEGATIVE mg/dL
Ketones, ur: NEGATIVE mg/dL
Ketones, ur: NEGATIVE mg/dL
Nitrite: NEGATIVE
Nitrite: NEGATIVE
Nitrite: NEGATIVE
Protein, ur: NEGATIVE mg/dL
Protein, ur: NEGATIVE mg/dL
Protein, ur: NEGATIVE mg/dL
Specific Gravity, Urine: 1.024 (ref 1.005–1.030)
Specific Gravity, Urine: 1.013 (ref 1.005–1.030)
Specific Gravity, Urine: 1.013 (ref 1.005–1.030)
Urobilinogen, UA: 1 mg/dL (ref 0.0–1.0)
Urobilinogen, UA: 1 mg/dL (ref 0.0–1.0)
Urobilinogen, UA: 1 mg/dL (ref 0.0–1.0)
pH: 7 (ref 5.0–8.0)
pH: 6.5 (ref 5.0–8.0)
pH: 7 (ref 5.0–8.0)

## 2011-01-26 LAB — CBC
HCT: 42.8 % (ref 36.0–46.0)
HCT: 44.5 % (ref 36.0–46.0)
HCT: 44.5 % (ref 36.0–46.0)
Hemoglobin: 14.1 g/dL (ref 12.0–15.0)
Hemoglobin: 14.7 g/dL (ref 12.0–15.0)
Hemoglobin: 14.7 g/dL (ref 12.0–15.0)
MCHC: 32.9 g/dL (ref 30.0–36.0)
MCHC: 33.1 g/dL (ref 30.0–36.0)
MCHC: 33.2 g/dL (ref 30.0–36.0)
MCV: 90.2 fL (ref 78.0–100.0)
MCV: 91.3 fL (ref 78.0–100.0)
MCV: 91.5 fL (ref 78.0–100.0)
Platelets: 297 10*3/uL (ref 150–400)
Platelets: 324 10*3/uL (ref 150–400)
Platelets: 331 10*3/uL (ref 150–400)
RBC: 4.74 MIL/uL (ref 3.87–5.11)
RBC: 4.87 MIL/uL (ref 3.87–5.11)
RBC: 4.87 MIL/uL (ref 3.87–5.11)
RDW: 13.5 % (ref 11.5–15.5)
RDW: 13 % (ref 11.5–15.5)
RDW: 13.1 % (ref 11.5–15.5)
WBC: 8.3 10*3/uL (ref 4.0–10.5)
WBC: 4.9 10*3/uL (ref 4.0–10.5)
WBC: 5.3 10*3/uL (ref 4.0–10.5)

## 2011-01-26 LAB — URINE CULTURE: Colony Count: 50000

## 2011-01-26 LAB — POTASSIUM: Potassium: 3.4 mEq/L — ABNORMAL LOW (ref 3.5–5.1)

## 2011-01-26 LAB — ETHANOL: Alcohol, Ethyl (B): <5 mg/dL (ref 0–10)

## 2011-02-02 LAB — COMPREHENSIVE METABOLIC PANEL
ALT: 15 U/L (ref 0–35)
ALT: 18 U/L (ref 0–35)
AST: 21 U/L (ref 0–37)
AST: 25 U/L (ref 0–37)
Albumin: 3.3 g/dL — ABNORMAL LOW (ref 3.5–5.2)
Albumin: 3.5 g/dL (ref 3.5–5.2)
Alkaline Phosphatase: 48 U/L (ref 39–117)
Alkaline Phosphatase: 50 U/L (ref 39–117)
BUN: 18 mg/dL (ref 6–23)
BUN: 20 mg/dL (ref 6–23)
CO2: 20 mEq/L (ref 19–32)
CO2: 28 mEq/L (ref 19–32)
Calcium: 8.7 mg/dL (ref 8.4–10.5)
Calcium: 9.7 mg/dL (ref 8.4–10.5)
Chloride: 102 mEq/L (ref 96–112)
Chloride: 107 mEq/L (ref 96–112)
Creatinine, Ser: 0.72 mg/dL (ref 0.4–1.2)
Creatinine, Ser: 1.08 mg/dL (ref 0.4–1.2)
GFR calc Af Amer: >60 mL/min (ref >60)
GFR calc Af Amer: >60 mL/min (ref >60)
GFR calc non Af Amer: 53 mL/min — ABNORMAL LOW (ref >60)
GFR calc non Af Amer: >60 mL/min (ref >60)
Glucose, Bld: 107 mg/dL — ABNORMAL HIGH (ref 70–99)
Glucose, Bld: 88 mg/dL (ref 70–99)
Potassium: 3.3 mEq/L — ABNORMAL LOW (ref 3.5–5.1)
Potassium: 3.8 mEq/L (ref 3.5–5.1)
Sodium: 138 mEq/L (ref 135–145)
Sodium: 139 mEq/L (ref 135–145)
Total Bilirubin: 0.4 mg/dL (ref 0.3–1.2)
Total Bilirubin: 0.6 mg/dL (ref 0.3–1.2)
Total Protein: 6.8 g/dL (ref 6.0–8.3)
Total Protein: 7.2 g/dL (ref 6.0–8.3)

## 2011-02-02 LAB — DIFFERENTIAL
Basophils Absolute: 0 10*3/uL (ref 0.0–0.1)
Basophils Absolute: 0 10*3/uL (ref 0.0–0.1)
Basophils Absolute: 0.1 10*3/uL (ref 0.0–0.1)
Basophils Relative: 1 % (ref 0–1)
Basophils Relative: 1 % (ref 0–1)
Basophils Relative: 2 % — ABNORMAL HIGH (ref 0–1)
Eosinophils Absolute: 0.2 10*3/uL (ref 0.0–0.7)
Eosinophils Absolute: 0.2 10*3/uL (ref 0.0–0.7)
Eosinophils Absolute: 0.3 10*3/uL (ref 0.0–0.7)
Eosinophils Relative: 2 % (ref 0–5)
Eosinophils Relative: 4 % (ref 0–5)
Eosinophils Relative: 4 % (ref 0–5)
Lymphocytes Relative: 31 % (ref 12–46)
Lymphocytes Relative: 38 % (ref 12–46)
Lymphocytes Relative: 51 % — ABNORMAL HIGH (ref 12–46)
Lymphs Abs: 1.7 10*3/uL (ref 0.7–4.0)
Lymphs Abs: 2.7 10*3/uL (ref 0.7–4.0)
Lymphs Abs: 3.5 10*3/uL (ref 0.7–4.0)
Monocytes Absolute: 0.6 10*3/uL (ref 0.1–1.0)
Monocytes Absolute: 0.6 10*3/uL (ref 0.1–1.0)
Monocytes Absolute: 0.8 10*3/uL (ref 0.1–1.0)
Monocytes Relative: 11 % (ref 3–12)
Monocytes Relative: 9 % (ref 3–12)
Monocytes Relative: 11 % (ref 3–12)
Neutro Abs: 3 10*3/uL (ref 1.7–7.7)
Neutro Abs: 3.7 10*3/uL (ref 1.7–7.7)
Neutro Abs: 2.2 10*3/uL (ref 1.7–7.7)
Neutrophils Relative %: 51 % (ref 43–77)
Neutrophils Relative %: 55 % (ref 43–77)
Neutrophils Relative %: 32 % — ABNORMAL LOW (ref 43–77)

## 2011-02-02 LAB — URINALYSIS, ROUTINE W REFLEX MICROSCOPIC
Bilirubin Urine: NEGATIVE
Bilirubin Urine: NEGATIVE
Glucose, UA: NEGATIVE mg/dL
Glucose, UA: NEGATIVE mg/dL
Hgb urine dipstick: NEGATIVE
Hgb urine dipstick: NEGATIVE
Ketones, ur: NEGATIVE mg/dL
Ketones, ur: NEGATIVE mg/dL
Nitrite: NEGATIVE
Nitrite: NEGATIVE
Protein, ur: NEGATIVE mg/dL
Protein, ur: NEGATIVE mg/dL
Specific Gravity, Urine: 1.017 (ref 1.005–1.030)
Specific Gravity, Urine: 1.015 (ref 1.005–1.030)
Urobilinogen, UA: 0.2 mg/dL (ref 0.0–1.0)
Urobilinogen, UA: 0.2 mg/dL (ref 0.0–1.0)
pH: 5 (ref 5.0–8.0)
pH: 6 (ref 5.0–8.0)

## 2011-02-02 LAB — POCT I-STAT, CHEM 8
BUN: 16 mg/dL (ref 6–23)
Calcium, Ion: 1.12 mmol/L (ref 1.12–1.32)
Chloride: 105 mEq/L (ref 96–112)
Creatinine, Ser: 0.8 mg/dL (ref 0.4–1.2)
Glucose, Bld: 89 mg/dL (ref 70–99)
HCT: 38 % (ref 36.0–46.0)
Hemoglobin: 12.9 g/dL (ref 12.0–15.0)
Potassium: 3.4 mEq/L — ABNORMAL LOW (ref 3.5–5.1)
Sodium: 137 mEq/L (ref 135–145)
TCO2: 27 mmol/L (ref 0–100)

## 2011-02-02 LAB — RAPID URINE DRUG SCREEN, HOSP PERFORMED
Amphetamines: NOT DETECTED
Amphetamines: NOT DETECTED
Barbiturates: NOT DETECTED
Barbiturates: NOT DETECTED
Benzodiazepines: POSITIVE — AB
Benzodiazepines: POSITIVE — AB
Cocaine: NOT DETECTED
Cocaine: NOT DETECTED
Opiates: NOT DETECTED
Opiates: POSITIVE — AB
Tetrahydrocannabinol: NOT DETECTED
Tetrahydrocannabinol: NOT DETECTED

## 2011-02-02 LAB — CBC
HCT: 34.3 % — ABNORMAL LOW (ref 36.0–46.0)
HCT: 36.5 % (ref 36.0–46.0)
HCT: 36.2 % (ref 36.0–46.0)
Hemoglobin: 11.6 g/dL — ABNORMAL LOW (ref 12.0–15.0)
Hemoglobin: 11.9 g/dL — ABNORMAL LOW (ref 12.0–15.0)
Hemoglobin: 12.3 g/dL (ref 12.0–15.0)
MCHC: 32.7 g/dL (ref 30.0–36.0)
MCHC: 33.8 g/dL (ref 30.0–36.0)
MCHC: 34 g/dL (ref 30.0–36.0)
MCV: 92.9 fL (ref 78.0–100.0)
MCV: 93.6 fL (ref 78.0–100.0)
MCV: 93.4 fL (ref 78.0–100.0)
Platelets: 259 10*3/uL (ref 150–400)
Platelets: 317 10*3/uL (ref 150–400)
Platelets: 274 10*3/uL (ref 150–400)
RBC: 3.67 MIL/uL — ABNORMAL LOW (ref 3.87–5.11)
RBC: 3.93 MIL/uL (ref 3.87–5.11)
RBC: 3.87 MIL/uL (ref 3.87–5.11)
RDW: 13.4 % (ref 11.5–15.5)
RDW: 13.7 % (ref 11.5–15.5)
RDW: 14 % (ref 11.5–15.5)
WBC: 5.4 10*3/uL (ref 4.0–10.5)
WBC: 7.1 10*3/uL (ref 4.0–10.5)
WBC: 6.9 10*3/uL (ref 4.0–10.5)

## 2011-02-02 LAB — ETHANOL
Alcohol, Ethyl (B): <5 mg/dL (ref 0–10)
Alcohol, Ethyl (B): <5 mg/dL (ref 0–10)

## 2011-02-02 LAB — BRAIN NATRIURETIC PEPTIDE: Pro B Natriuretic peptide (BNP): 143 pg/mL — ABNORMAL HIGH (ref 0.0–100.0)

## 2011-02-05 ENCOUNTER — Emergency Department (HOSPITAL_COMMUNITY): Payer: Self-pay

## 2011-02-05 ENCOUNTER — Emergency Department (HOSPITAL_COMMUNITY)
Admission: EM | Admit: 2011-02-05 | Discharge: 2011-02-05 | Disposition: A | Discharge status: Home / Self Care | Payer: Self-pay | Attending: Emergency Medicine | Admitting: Emergency Medicine

## 2011-02-05 DIAGNOSIS — M7730 Calcaneal spur, unspecified foot: Secondary | ICD-10-CM | POA: Insufficient documentation

## 2011-02-05 DIAGNOSIS — J45909 Unspecified asthma, uncomplicated: Secondary | ICD-10-CM | POA: Insufficient documentation

## 2011-02-05 DIAGNOSIS — M79609 Pain in unspecified limb: Secondary | ICD-10-CM | POA: Insufficient documentation

## 2011-02-05 DIAGNOSIS — Z79899 Other long term (current) drug therapy: Secondary | ICD-10-CM | POA: Insufficient documentation

## 2011-02-05 DIAGNOSIS — M7989 Other specified soft tissue disorders: Secondary | ICD-10-CM | POA: Insufficient documentation

## 2011-02-05 DIAGNOSIS — Z8619 Personal history of other infectious and parasitic diseases: Secondary | ICD-10-CM | POA: Insufficient documentation

## 2011-02-05 DIAGNOSIS — E785 Hyperlipidemia, unspecified: Secondary | ICD-10-CM | POA: Insufficient documentation

## 2011-02-05 DIAGNOSIS — I10 Essential (primary) hypertension: Secondary | ICD-10-CM | POA: Insufficient documentation

## 2011-02-05 DIAGNOSIS — Z8659 Personal history of other mental and behavioral disorders: Secondary | ICD-10-CM | POA: Insufficient documentation

## 2011-02-05 DIAGNOSIS — F341 Dysthymic disorder: Secondary | ICD-10-CM | POA: Insufficient documentation

## 2011-02-13 ENCOUNTER — Emergency Department (HOSPITAL_COMMUNITY)
Admission: EM | Admit: 2011-02-13 | Discharge: 2011-02-13 | Disposition: A | Discharge status: Home / Self Care | Payer: Self-pay | Attending: Emergency Medicine | Admitting: Emergency Medicine

## 2011-02-13 DIAGNOSIS — F341 Dysthymic disorder: Secondary | ICD-10-CM | POA: Insufficient documentation

## 2011-02-13 DIAGNOSIS — M7730 Calcaneal spur, unspecified foot: Secondary | ICD-10-CM | POA: Insufficient documentation

## 2011-02-13 DIAGNOSIS — I10 Essential (primary) hypertension: Secondary | ICD-10-CM | POA: Insufficient documentation

## 2011-02-13 DIAGNOSIS — E785 Hyperlipidemia, unspecified: Secondary | ICD-10-CM | POA: Insufficient documentation

## 2011-02-13 DIAGNOSIS — G8929 Other chronic pain: Secondary | ICD-10-CM | POA: Insufficient documentation

## 2011-02-13 DIAGNOSIS — Z8619 Personal history of other infectious and parasitic diseases: Secondary | ICD-10-CM | POA: Insufficient documentation

## 2011-02-13 DIAGNOSIS — M79609 Pain in unspecified limb: Secondary | ICD-10-CM | POA: Insufficient documentation

## 2011-02-13 DIAGNOSIS — J45909 Unspecified asthma, uncomplicated: Secondary | ICD-10-CM | POA: Insufficient documentation

## 2011-03-02 NOTE — H&P (Signed)
NAMEBEVERLEY, ALLENDER NO.:  [id number]   MEDICAL RECORD NO.:  [id number]          PATIENT TYPE:  INP   LOCATION:  4741                         FACILITY:  [facility]   PHYSICIAN:  Leighton Roach McDiarmid, M.D.DATE OF BIRTH:  May 12, 1951   DATE OF ADMISSION:  11/15/2007  DATE OF DISCHARGE:                              HISTORY & PHYSICAL   PRIMARY CARE PHYSICIAN:  Financial trader.   CHIEF COMPLAINT:  Cough, congestion, and weakness.   HISTORY OF PRESENT ILLNESS:  Ms. Traber is a 60 year old female with a  past medical history significant for hypertension and depression and a  history of drug and alcohol abuse.  She presented to the emergency  department with complaints of upper respiratory symptoms for 3-4 days.  She endorses a cough productive of greenish yellow sputum, congestion,  subjective fever and chills, and body aches.  She also complains of some  shortness of breath.  She has had positive sick contacts.  She did not  get a flu shot this year.  She has been having diarrhea approximately 4  loose watery non-bloody stools a day but no nausea or vomiting.  She has  had decreased p.o. intake and dizziness for 3 days.  Throughout all  this, she has been taking all of her meds including her blood pressure  medicine.   REVIEW OF SYSTEMS:  As per HPI, in addition it is positive for headache  and right hand numbness which she has had in the past.  It is negative  for dysuria, visual changes, or sudden weakness.   PAST MEDICAL HISTORY:  1. Hypertension.  2. Depression and note she had been on Celexa previously but this was      discontinued secondary to suicidal ideation.  3. Nervous twitching/anxiety.  4. History of alcohol abuse.  5. History of drug abuse.  6. Tobacco abuse.   MEDICATIONS:  1. Verapamil 240 mg daily.  2. Protonix 40 mg daily.  3. Dyazide 50/75 daily.  4. Monopril 10 mg daily.  5. Lipitor 20 mg daily.  6. Alprazolam 0.5 mg one tab in the  morning and afternoon, and two      tablets q.h.s.  7. Albuterol p.r.n.   ALLERGIES:  PENICILLIN.   SOCIAL HISTORY:  She homeless but she lives with her daughter.  She may  return back to her daughter following this hospitalization.  She has  abstained from alcohol for at least 6 months.  She had been off of crack  for 6 years until this last December when she relapsed, but she has been  clean again for approximately 1 month.  She does have a history of IV  heroin and cocaine use in the 1970s, and she is possibly exposed to  marijuana and she does smoke occasional cigarettes.   FAMILY HISTORY:  She states her mother died of an MI at age 70, and she  states she has a sister in her 41s who has had two CVAs.   PHYSICAL EXAMINATION:  VITAL SIGNS:  Temperature 97, pulse 88-113,  respirations 20-22, blood pressure 80-103 over 55-66, and sating 96-100%  on room  air.  GENERAL:  She was ill-appearing but in no acute distress.  She had  frequent eye-twitching but was cooperative to exam.  HEENT:  Pupils are equal, round, and reactive to light.  Extraocular  movements intact.  Oropharynx clear.  Mucous membranes tacky.  Bilateral  TMs pearly gray with positive light reflex.  NECK:  Showed no lymphadenopathy.  CV:  Regular rate and rhythm without a murmur.  LUNGS:  Minimally decreased breath sounds throughout but no wheezes or  crackles appreciated.  ABDOMEN:  Soft, nontender, nondistended.  Positive bowel sounds.  EXTREMITIES:  No edema.  Two plus pulses.  NEUROLOGIC:  Cranial nerves II-XII intact.  Strength and sensation equal  and appropriate x4.   LABORATORY:  Showed a white blood cell count of 6, hemoglobin 14.3 with  a hematocrit of 42.6, and platelets of 269, differential with 25%  neutrophils, 49% lymphocytes, 21% monocytes.  Sodium 132, potassium 3,  BUN 25, creatinine 1.86, of note in April 2008, her creatinine was 0.84,  and a glucose of 120.  Liver function tests were all within  normal  limits.  Lipase was 83 and a UA was negative.  Chest x-ray showed COPD  with bronchitic changes but nothing acute.  A KUB showed nothing acute.   ASSESSMENT:  This is a 60 year old female with an upper respiratory  infection and decreased p.o. intake, hypotension, and acute renal  failure.   PLAN:  1. Upper respiratory infection.  This is likely viral.  We will check      a rapid flu.  However, with her COPD picture on the chest x-ray and      a possible bronchitis, we will start doxycycline 100 mg b.i.d.  She      is afebrile and not requiring any oxygen.  2. Hypotension.  She has taken all of her blood pressure medicines      despite decreased p.o. intake and diarrhea.  The likely etiology of      her hypotension is hypovolemia.  She does not appear septic.  Her      blood pressure is improving with rehydration in the ED.  We will      bolus her with 1 more liter of normal saline as she received 2 in      the ED, then we will run her fluids of normal saline at 200 ml/hr      overnight.  We will hold all her blood pressure medicines.  3. Acute renal failure.  Her creatinine in April 2008 was 0.84.  It is      now 1.84.  This appears secondary to dehydration, therefore,      prerenal.  We will re-hydrate her and recheck her B-MET in the      morning.  We are also holding her ACE inhibitor while she is      dehydrated and will not restart until her creatinine normalizes.  4. Hypokalemia.  We will give her 40 mEq of K-Dur x2 tonight and      recheck in the morning.  5. Psychiatric.  We will continue her home Xanax.  6. Hyperlipidemia.  We will continue her Lipitor.  7. Prophylaxis with Protonix and Lovenox started.  8. Disposition.  We will admit her as an observation patient.  We will      re-hydrate and hold her blood pressure medicines until her pressure      stabilizes.  We will check a rapid flu.  If her blood  pressure      stabilizes and her creatinine improves and she  remains __________      standpoint, she may be possibly discharged tomorrow.      Ardeen Garland, MD  Electronically Signed      Leighton Roach McDiarmid, M.D.  Electronically Signed    LM/MEDQ  D:  11/16/2007  T:  11/16/2007  Job:  [id number]

## 2011-03-02 NOTE — Discharge Summary (Signed)
Danielle [name], Danielle [name] NO.:  [id number]   MEDICAL RECORD NO.:  [id number]          PATIENT TYPE:  INP   LOCATION:  4741                         FACILITY:  [facility]   PHYSICIAN:  Nestor Ramp, MD        DATE OF BIRTH:  1951/02/20   DATE OF ADMISSION:  11/15/2007  DATE OF DISCHARGE:  11/18/2007                               DISCHARGE SUMMARY   PRIMARY CARE PHYSICIAN:  [facility].   DISCHARGE DIAGNOSES:  1. Hypertension.  2. Depression and anxiety.  3. Acute renal failure and hypotension due to hypovolemia.  4. Viral gastroenteritis.   CONSULTATIONS:  1. Cardiology.   PROCEDURES:  1. Echocardiogram on November 16, 2007 that showed an overall normal      left ventricular systolic function with an ejection fraction of 55%      to 60% without left ventricular regional wall motion abnormalities.      Also showed systolic mitral bowing of the anterior leaflet without      diagnostic evidence of mitral valve prolapse. Moderate mitral      regurgitation and mild left and right atrial dilatation.  2. Chest x-ray on November 15, 2007, which showed coarse central      bronchovascular markers, which represented bronchitis versus      bronchitic change.  3. Abdominal 2-view on November 15, 2007, which showed no evidence of      intraperitoneal free air and a normal bowel gas pattern.  4. A repeat abdominal film on November 16, 2007, which showed a non-      specific, non-obstructive bowel gas pattern.  5. Chest x-ray on November 17, 2007, which showed a new linear      atelectasis in the left mid lung zone without other changes.   DISCHARGE MEDICATIONS:  1. Verapamil 240 mg p.o. daily.  2. Protonix 40 mg p.o. daily.  3. Diazide 50/75 1 tab p.o. daily.  4. Monopril 10 mg p.o. daily.  5. Lipitor 20 mg p.o. daily.  6. Xanax 0.5 mg 1 tablet in the morning and 2 tablets at bedtime.  7. Albuterol as needed.  8. The patient is instructed to stop taking the Doxycycline that was     started in the hospital and no prescriptions were given as the      medicines that the patient is going home on are the ones that she      was admitted on. She should contact her primary care physician for      refills if needed.   HOSPITAL COURSE:  1. This is a 60 year old female admitted with hypotension and acute      renal failure due to a diarrhea illness, as well as an upper      respiratory infection.  2. HYPOTENSION/ACUTE RENAL FAILURE:  Both of these issues were thought      to be due to the diarrhea that the patient had been experiencing.      She was given three 1 liter boluses on admission and started on 200      ml  per hour of fluids afterwards. Her hypotension  resolved and on      discharge, her blood pressure was 134/68. This is in contrast to an      admsision blood pressure of 80/55. The acute renal failure likewise      improved with rehydration. She had a baseline creatinine in April      2008 of 0.84 and was admitted with a creatinine of 1.86. With      rehydration, her creatinine trended down adn on day of discharge,      her creatinine is 0.78. On day of discharge, the patient is no      longer complaining of loose stools. She had several loose stools      during this admsision and was found to be C.diff negative. Her      diarrhea is most likely a viral gastroenteritis.  3. HYPOKALEMIA:  The patient had mild hypokalemia throughout this      admission and was repleted twice with oral potassium chloride. On      day of discharge, her potassium is 3.4. We will give her an      additional 40 meq of potassium on discharge, immediately prior to      discharge. We will not recheck a B-met. This should be a followup      issue for her primary care physician. I suspect that this was due      to her volume status and her diarrhea.  4. HYPERTENSION:  We held the patient's hypertensive medications,      Verapamil, Diazide, and Monopril throughout this admission. These       have been restarted on discharge.  5. FE NGI:  The patient's diet was advanced throughout this admission      and on day of discharge, she is tolerating solid food and her IV      fluids have been off for 24 hours. She is maintaining good      hydration status.  6. CARDIAC:  Danielle [name] was placed on a telemetry bed when she was      admitted and incidentally found to have a very short run of      unsustained V-tach. In response to this, we cycled her cardiac      enzymes, which were negative and we ordered an echocardiogram,      which showed the findings described above. Cardiology was consulted      on the day prior to admission and felt that the V-tach was an      artifact and that she has only sinus rhythm. Their recommendation      is for her primary care physician to schedule a followup      echocardiogram in 1 years time to monitor any progression of mitral      valve prolapse.  7. ANXIETY:  The patient's home dose of Xanax was continued throughout      admission.  8. POLYSUBSTANCE ABUSE:  The patient has a history of polysubstance      abuse and her UDS was negative this time with the exception of      benzodiazepines, which she is prescribed.   DISPOSITION:  The patient is discharged to home.   DIET:  No restrictions.   ACTIVITY:  No restrictions.   FOLLOWUP:  She is asked to make a followup appointment with [facility].  1. The patient is given the number for [facility] and is asked to      call for an appointment to  followup.   CONDITION ON DISCHARGE:  The patient was discharged to home in stable  medical condition.      Romero Belling, MD  Electronically Signed      Nestor Ramp, MD  Electronically Signed    MO/MEDQ  D:  11/18/2007  T:  11/18/2007  Job:  [id number]

## 2011-03-02 NOTE — Procedures (Signed)
EEG NUMBER:  [id number]   CLINICAL HISTORY:  This is a routine EEG with photic stimulation but  without hyperventilation.  The patient is described as awake and asleep.  This is a 60 year old woman with possible seizure activity.  EEG is  performed for evaluation.   DESCRIPTION:  The dominant rhythm in this tracing is a moderate,  amplitude alpha rhythm of 10 Hz, which predominates posteriorly, appears  without abnormal asymmetry, and attenuates with eye opening and closing.  Low amplitude fast activity is seen frontally and centrally and appears  without abnormal asymmetry.  This low amplitude fast activity is fairly  abundant.  No focal slowing is noted and no epileptiform discharges were  seen.  Drowsiness occurs naturally as evidenced by fragmentation of the  background and generalized attenuation of rhythms.  In the middle of the  recording, some stage II sleep is recorded with appearance of some sleep  spindles and K complexes.  No abnormalities seen in sleep.  Photic  stimulation produced symmetric driving responses.  Single channel  devoted to EKG revealed sinus rhythm throughout with a rate of  approximately 72 beats per minute.   CONCLUSION:  Normal study in awake, drowsy, and light sleep states.      Michael L. Thad Ranger, M.D.  Electronically Signed     ZOX:WRUE  D:  11/21/2008 20:40:13  T:  11/22/2008 03:57:46  Job #:  [id number]

## 2011-04-18 ENCOUNTER — Emergency Department (HOSPITAL_COMMUNITY)
Admission: EM | Admit: 2011-04-18 | Discharge: 2011-04-18 | Disposition: A | Discharge status: Home / Self Care | Payer: Self-pay | Attending: Emergency Medicine | Admitting: Emergency Medicine

## 2011-04-18 DIAGNOSIS — J45909 Unspecified asthma, uncomplicated: Secondary | ICD-10-CM | POA: Insufficient documentation

## 2011-04-18 DIAGNOSIS — R112 Nausea with vomiting, unspecified: Secondary | ICD-10-CM | POA: Insufficient documentation

## 2011-04-18 DIAGNOSIS — K219 Gastro-esophageal reflux disease without esophagitis: Secondary | ICD-10-CM | POA: Insufficient documentation

## 2011-04-18 DIAGNOSIS — Z8619 Personal history of other infectious and parasitic diseases: Secondary | ICD-10-CM | POA: Insufficient documentation

## 2011-04-18 DIAGNOSIS — Z79899 Other long term (current) drug therapy: Secondary | ICD-10-CM | POA: Insufficient documentation

## 2011-04-18 DIAGNOSIS — R109 Unspecified abdominal pain: Secondary | ICD-10-CM | POA: Insufficient documentation

## 2011-04-18 DIAGNOSIS — I10 Essential (primary) hypertension: Secondary | ICD-10-CM | POA: Insufficient documentation

## 2011-04-18 DIAGNOSIS — Z8659 Personal history of other mental and behavioral disorders: Secondary | ICD-10-CM | POA: Insufficient documentation

## 2011-04-18 DIAGNOSIS — E785 Hyperlipidemia, unspecified: Secondary | ICD-10-CM | POA: Insufficient documentation

## 2011-04-18 DIAGNOSIS — F411 Generalized anxiety disorder: Secondary | ICD-10-CM | POA: Insufficient documentation

## 2011-04-18 DIAGNOSIS — R609 Edema, unspecified: Secondary | ICD-10-CM | POA: Insufficient documentation

## 2011-04-18 DIAGNOSIS — R079 Chest pain, unspecified: Secondary | ICD-10-CM | POA: Insufficient documentation

## 2011-04-18 DIAGNOSIS — E876 Hypokalemia: Secondary | ICD-10-CM | POA: Insufficient documentation

## 2011-04-18 LAB — COMPREHENSIVE METABOLIC PANEL
ALT: 11 U/L (ref 0–35)
AST: 15 U/L (ref 0–37)
Albumin: 3.1 g/dL — ABNORMAL LOW (ref 3.5–5.2)
Alkaline Phosphatase: 79 U/L (ref 39–117)
BUN: 18 mg/dL (ref 6–23)
CO2: 26 mEq/L (ref 19–32)
Calcium: 8.9 mg/dL (ref 8.4–10.5)
Chloride: 106 mEq/L (ref 96–112)
Creatinine, Ser: 0.79 mg/dL (ref 0.50–1.10)
GFR calc Af Amer: >60 mL/min (ref >60)
GFR calc non Af Amer: >60 mL/min (ref >60)
Glucose, Bld: 99 mg/dL (ref 70–99)
Potassium: 3 mEq/L — ABNORMAL LOW (ref 3.5–5.1)
Sodium: 141 mEq/L (ref 135–145)
Total Bilirubin: 0.2 mg/dL — ABNORMAL LOW (ref 0.3–1.2)
Total Protein: 6.5 g/dL (ref 6.0–8.3)

## 2011-04-18 LAB — RAPID URINE DRUG SCREEN, HOSP PERFORMED
Amphetamines: NOT DETECTED
Barbiturates: NOT DETECTED
Benzodiazepines: POSITIVE — AB
Cocaine: POSITIVE — AB
Opiates: NOT DETECTED
Tetrahydrocannabinol: NOT DETECTED

## 2011-04-18 LAB — CBC
HCT: 35.7 % — ABNORMAL LOW (ref 36.0–46.0)
Hemoglobin: 12 g/dL (ref 12.0–15.0)
MCH: 29.9 pg (ref 26.0–34.0)
MCHC: 33.6 g/dL (ref 30.0–36.0)
MCV: 88.8 fL (ref 78.0–100.0)
Platelets: 255 10*3/uL (ref 150–400)
RBC: 4.02 MIL/uL (ref 3.87–5.11)
RDW: 13.3 % (ref 11.5–15.5)
WBC: 6.7 10*3/uL (ref 4.0–10.5)

## 2011-04-18 LAB — URINALYSIS, ROUTINE W REFLEX MICROSCOPIC
Bilirubin Urine: NEGATIVE
Glucose, UA: NEGATIVE mg/dL
Hgb urine dipstick: NEGATIVE
Ketones, ur: NEGATIVE mg/dL
Nitrite: NEGATIVE
Protein, ur: NEGATIVE mg/dL
Specific Gravity, Urine: 1.021 (ref 1.005–1.030)
Urobilinogen, UA: 1 mg/dL (ref 0.0–1.0)
pH: 6 (ref 5.0–8.0)

## 2011-04-18 LAB — URINE MICROSCOPIC-ADD ON

## 2011-04-18 LAB — CK TOTAL AND CKMB (NOT AT ARMC)
CK, MB: 3.6 ng/mL (ref 0.3–4.0)
Relative Index: 2.5 (ref 0.0–2.5)
Total CK: 146 U/L (ref 7–177)

## 2011-04-18 LAB — ETHANOL: Alcohol, Ethyl (B): <11 mg/dL (ref 0–11)

## 2011-04-18 LAB — DIFFERENTIAL
Basophils Absolute: 0 10*3/uL (ref 0.0–0.1)
Basophils Relative: 0 % (ref 0–1)
Eosinophils Absolute: 0.2 10*3/uL (ref 0.0–0.7)
Eosinophils Relative: 3 % (ref 0–5)
Lymphocytes Relative: 48 % — ABNORMAL HIGH (ref 12–46)
Lymphs Abs: 3.2 10*3/uL (ref 0.7–4.0)
Monocytes Absolute: 0.5 10*3/uL (ref 0.1–1.0)
Monocytes Relative: 8 % (ref 3–12)
Neutro Abs: 2.8 10*3/uL (ref 1.7–7.7)
Neutrophils Relative %: 42 % — ABNORMAL LOW (ref 43–77)

## 2011-04-18 LAB — LIPASE, BLOOD: Lipase: 61 U/L — ABNORMAL HIGH (ref 11–59)

## 2011-04-18 LAB — TROPONIN I: Troponin I: <0.3 ng/mL (ref <0.30)

## 2011-07-06 ENCOUNTER — Emergency Department: Payer: Self-pay | Admitting: Emergency Medicine

## 2011-07-08 LAB — BASIC METABOLIC PANEL
BUN: 13
BUN: 20
BUN: 8
CO2: 21
CO2: 22
CO2: 24
Calcium: 7.8 — ABNORMAL LOW
Calcium: 8.3 — ABNORMAL LOW
Calcium: 8.5
Chloride: 102
Chloride: 110
Chloride: 113 — ABNORMAL HIGH
Creatinine, Ser: 0.73
Creatinine, Ser: 0.78
Creatinine, Ser: 1.22 — ABNORMAL HIGH
GFR calc Af Amer: 56 — ABNORMAL LOW
GFR calc Af Amer: >60
GFR calc Af Amer: >60
GFR calc non Af Amer: 46 — ABNORMAL LOW
GFR calc non Af Amer: >60
GFR calc non Af Amer: >60
Glucose, Bld: 116 — ABNORMAL HIGH
Glucose, Bld: 74
Glucose, Bld: 90
Potassium: 3.4 — ABNORMAL LOW
Potassium: 3.7
Potassium: 4
Sodium: 136
Sodium: 138
Sodium: 139

## 2011-07-08 LAB — CBC
HCT: 36.3
HCT: 42.6
Hemoglobin: 12.1
Hemoglobin: 14.3
MCHC: 33.4
MCHC: 33.6
MCV: 90.6
MCV: 91.6
Platelets: 213
Platelets: 269
RBC: 3.97
RBC: 4.7
RDW: 14
RDW: 14.3
WBC: 6
WBC: 6.2

## 2011-07-08 LAB — DIFFERENTIAL
Basophils Absolute: 0.1
Basophils Relative: 1
Eosinophils Absolute: 0.2
Eosinophils Relative: 4
Lymphocytes Relative: 49 — ABNORMAL HIGH
Lymphs Abs: 2.9
Monocytes Absolute: 1.3 — ABNORMAL HIGH
Monocytes Relative: 21 — ABNORMAL HIGH
Neutro Abs: 1.5 — ABNORMAL LOW
Neutrophils Relative %: 25 — ABNORMAL LOW

## 2011-07-08 LAB — CLOSTRIDIUM DIFFICILE EIA: C difficile Toxins A+B, EIA: NEGATIVE

## 2011-07-08 LAB — COMPREHENSIVE METABOLIC PANEL
ALT: 35
AST: 44 — ABNORMAL HIGH
Albumin: 3.7
Alkaline Phosphatase: 46
BUN: 25 — ABNORMAL HIGH
CO2: 22
Calcium: 9.3
Chloride: 97
Creatinine, Ser: 1.86 — ABNORMAL HIGH
GFR calc Af Amer: 34 — ABNORMAL LOW
GFR calc non Af Amer: 28 — ABNORMAL LOW
Glucose, Bld: 120 — ABNORMAL HIGH
Potassium: 3 — ABNORMAL LOW
Sodium: 132 — ABNORMAL LOW
Total Bilirubin: 0.5
Total Protein: 7.9

## 2011-07-08 LAB — URINALYSIS, ROUTINE W REFLEX MICROSCOPIC
Bilirubin Urine: NEGATIVE
Glucose, UA: NEGATIVE
Hgb urine dipstick: NEGATIVE
Ketones, ur: NEGATIVE
Nitrite: NEGATIVE
Protein, ur: NEGATIVE
Specific Gravity, Urine: 1.012
Urobilinogen, UA: 1
pH: 6

## 2011-07-08 LAB — INFLUENZA A & B ANTIBODIES
Influenza A Virus Ab, IgG: 1.12 IV
Influenza A Virus Ab, IgM: 0.04 IV
Influenza B Virus Ab, IgM: 0.04 IV
Influenza B ab, IgG: 1.4 IV

## 2011-07-08 LAB — URINE DRUGS OF ABUSE SCREEN W ALC, ROUTINE (REF LAB)
Amphetamine Screen, Ur: NEGATIVE
Barbiturate Quant, Ur: NEGATIVE
Benzodiazepines.: POSITIVE — AB
Cocaine Metabolites: NEGATIVE
Creatinine,U: 37.8
Ethyl Alcohol: <5
Marijuana Metabolite: NEGATIVE
Methadone: NEGATIVE
Opiate Screen, Urine: NEGATIVE
Phencyclidine (PCP): NEGATIVE
Propoxyphene: NEGATIVE

## 2011-07-08 LAB — CARDIAC PANEL(CRET KIN+CKTOT+MB+TROPI)
CK, MB: 1.1
CK, MB: 1.2
Relative Index: INVALID
Relative Index: INVALID
Total CK: 85
Total CK: 92
Troponin I: 0.03
Troponin I: <0.01

## 2011-07-08 LAB — BENZODIAZEPINE, QUANTITATIVE, URINE
Alprazolam (GC/LC/MS), ur confirm: 100 ng/mL
Flurazepam GC/MS Conf: NEGATIVE
Nordiazepam GC/MS Conf: NEGATIVE
Oxazepam GC/MS Conf: NEGATIVE

## 2011-07-08 LAB — FECAL LACTOFERRIN, QUANT: Fecal Lactoferrin: POSITIVE

## 2011-07-08 LAB — STOOL CULTURE

## 2011-07-08 LAB — AMYLASE: Amylase: 119

## 2011-07-08 LAB — LIPASE, BLOOD
Lipase: 74 — ABNORMAL HIGH
Lipase: 83 — ABNORMAL HIGH

## 2011-07-12 ENCOUNTER — Emergency Department: Payer: Self-pay | Admitting: *Deleted

## 2011-07-17 ENCOUNTER — Emergency Department: Payer: Self-pay | Admitting: *Deleted

## 2011-07-20 LAB — POCT I-STAT, CHEM 8
BUN: 17
Calcium, Ion: 1.27
Chloride: 104
Creatinine, Ser: 1.1
Glucose, Bld: 89
HCT: 43
Hemoglobin: 14.6
Potassium: 3.6
Sodium: 138
TCO2: 25

## 2011-07-20 LAB — CBC
HCT: 41.7
Hemoglobin: 13.9
MCHC: 33.3
MCV: 92.9
Platelets: 316
RBC: 4.49
RDW: 14.1
WBC: 6.9

## 2011-07-20 LAB — URINE CULTURE: Colony Count: 40000

## 2011-07-20 LAB — URINALYSIS, ROUTINE W REFLEX MICROSCOPIC
Bilirubin Urine: NEGATIVE
Glucose, UA: NEGATIVE
Hgb urine dipstick: NEGATIVE
Ketones, ur: NEGATIVE
Nitrite: NEGATIVE
Protein, ur: NEGATIVE
Specific Gravity, Urine: 1.012
Urobilinogen, UA: 1
pH: 7

## 2011-07-20 LAB — DIFFERENTIAL
Basophils Absolute: 0.1
Basophils Relative: 1
Eosinophils Absolute: 0.1
Eosinophils Relative: 1
Lymphocytes Relative: 39
Lymphs Abs: 2.6
Monocytes Absolute: 0.8
Monocytes Relative: 11
Neutro Abs: 3.3
Neutrophils Relative %: 49

## 2011-07-20 LAB — MAGNESIUM: Magnesium: 1.9

## 2011-07-22 LAB — BASIC METABOLIC PANEL
BUN: 18 mg/dL (ref 6–23)
CO2: 28 mEq/L (ref 19–32)
Calcium: 9.6 mg/dL (ref 8.4–10.5)
Chloride: 103 mEq/L (ref 96–112)
Creatinine, Ser: 1.16 mg/dL (ref 0.4–1.2)
GFR calc Af Amer: 59 mL/min — ABNORMAL LOW (ref >60)
GFR calc non Af Amer: 49 mL/min — ABNORMAL LOW (ref >60)
Glucose, Bld: 88 mg/dL (ref 70–99)
Potassium: 3.9 mEq/L (ref 3.5–5.1)
Sodium: 137 mEq/L (ref 135–145)

## 2011-07-22 LAB — CBC
HCT: 37.2 % (ref 36.0–46.0)
Hemoglobin: 12.1 g/dL (ref 12.0–15.0)
MCHC: 32.6 g/dL (ref 30.0–36.0)
MCV: 92.9 fL (ref 78.0–100.0)
Platelets: 251 10*3/uL (ref 150–400)
RBC: 4 MIL/uL (ref 3.87–5.11)
RDW: 13.2 % (ref 11.5–15.5)
WBC: 6.9 10*3/uL (ref 4.0–10.5)

## 2011-07-22 LAB — URINE CULTURE: Colony Count: 45000

## 2011-07-22 LAB — URINALYSIS, ROUTINE W REFLEX MICROSCOPIC
Bilirubin Urine: NEGATIVE
Glucose, UA: NEGATIVE mg/dL
Hgb urine dipstick: NEGATIVE
Ketones, ur: NEGATIVE mg/dL
Nitrite: NEGATIVE
Protein, ur: NEGATIVE mg/dL
Specific Gravity, Urine: 1.012 (ref 1.005–1.030)
Urobilinogen, UA: 0.2 mg/dL (ref 0.0–1.0)
pH: 6 (ref 5.0–8.0)

## 2011-07-22 LAB — DIFFERENTIAL
Basophils Absolute: 0 10*3/uL (ref 0.0–0.1)
Basophils Relative: 1 % (ref 0–1)
Eosinophils Absolute: 0.1 10*3/uL (ref 0.0–0.7)
Eosinophils Relative: 2 % (ref 0–5)
Lymphocytes Relative: 49 % — ABNORMAL HIGH (ref 12–46)
Lymphs Abs: 3.4 10*3/uL (ref 0.7–4.0)
Monocytes Absolute: 0.6 10*3/uL (ref 0.1–1.0)
Monocytes Relative: 8 % (ref 3–12)
Neutro Abs: 2.8 10*3/uL (ref 1.7–7.7)
Neutrophils Relative %: 40 % — ABNORMAL LOW (ref 43–77)

## 2011-07-27 ENCOUNTER — Emergency Department (HOSPITAL_COMMUNITY)
Admission: EM | Admit: 2011-07-27 | Discharge: 2011-07-27 | Discharge status: Against Medical Advice | Payer: Self-pay | Attending: Emergency Medicine | Admitting: Emergency Medicine

## 2011-07-27 DIAGNOSIS — I10 Essential (primary) hypertension: Secondary | ICD-10-CM | POA: Insufficient documentation

## 2011-07-27 DIAGNOSIS — F410 Panic disorder [episodic paroxysmal anxiety] without agoraphobia: Secondary | ICD-10-CM | POA: Insufficient documentation

## 2011-07-27 DIAGNOSIS — H9209 Otalgia, unspecified ear: Secondary | ICD-10-CM | POA: Insufficient documentation

## 2011-07-27 DIAGNOSIS — Z91199 Patient's noncompliance with other medical treatment and regimen due to unspecified reason: Secondary | ICD-10-CM | POA: Insufficient documentation

## 2011-07-27 DIAGNOSIS — Z8619 Personal history of other infectious and parasitic diseases: Secondary | ICD-10-CM | POA: Insufficient documentation

## 2011-07-27 DIAGNOSIS — K219 Gastro-esophageal reflux disease without esophagitis: Secondary | ICD-10-CM | POA: Insufficient documentation

## 2011-07-27 DIAGNOSIS — F341 Dysthymic disorder: Secondary | ICD-10-CM | POA: Insufficient documentation

## 2011-07-27 DIAGNOSIS — K120 Recurrent oral aphthae: Secondary | ICD-10-CM | POA: Insufficient documentation

## 2011-07-27 DIAGNOSIS — Z9119 Patient's noncompliance with other medical treatment and regimen: Secondary | ICD-10-CM | POA: Insufficient documentation

## 2011-07-27 DIAGNOSIS — J45909 Unspecified asthma, uncomplicated: Secondary | ICD-10-CM | POA: Insufficient documentation

## 2011-07-27 DIAGNOSIS — E785 Hyperlipidemia, unspecified: Secondary | ICD-10-CM | POA: Insufficient documentation

## 2011-07-27 LAB — DIFFERENTIAL
Basophils Absolute: 0.1
Basophils Relative: 1
Eosinophils Absolute: 0.2
Eosinophils Relative: 3
Lymphocytes Relative: 43
Lymphs Abs: 3.7 — ABNORMAL HIGH
Monocytes Absolute: 1 — ABNORMAL HIGH
Monocytes Relative: 12 — ABNORMAL HIGH
Neutro Abs: 3.5
Neutrophils Relative %: 41 — ABNORMAL LOW

## 2011-07-27 LAB — CBC
HCT: 38.8
Hemoglobin: 13.2
MCHC: 34
MCV: 88.2
Platelets: 315
RBC: 4.4
RDW: 14.7 — ABNORMAL HIGH
WBC: 8.5

## 2011-09-14 ENCOUNTER — Other Ambulatory Visit: Payer: Self-pay

## 2011-09-14 ENCOUNTER — Observation Stay (HOSPITAL_COMMUNITY)
Admission: EM | Admit: 2011-09-14 | Discharge: 2011-09-16 | Disposition: A | Discharge status: Home / Self Care | Payer: Self-pay | Attending: Family Medicine | Admitting: Family Medicine

## 2011-09-14 ENCOUNTER — Encounter: Payer: Self-pay | Admitting: Emergency Medicine

## 2011-09-14 ENCOUNTER — Emergency Department (HOSPITAL_COMMUNITY): Payer: Self-pay

## 2011-09-14 DIAGNOSIS — J45909 Unspecified asthma, uncomplicated: Secondary | ICD-10-CM | POA: Insufficient documentation

## 2011-09-14 DIAGNOSIS — Z91199 Patient's noncompliance with other medical treatment and regimen due to unspecified reason: Secondary | ICD-10-CM

## 2011-09-14 DIAGNOSIS — F1411 Cocaine abuse, in remission: Secondary | ICD-10-CM | POA: Insufficient documentation

## 2011-09-14 DIAGNOSIS — I10 Essential (primary) hypertension: Secondary | ICD-10-CM | POA: Diagnosis present

## 2011-09-14 DIAGNOSIS — F3289 Other specified depressive episodes: Secondary | ICD-10-CM | POA: Insufficient documentation

## 2011-09-14 DIAGNOSIS — K5900 Constipation, unspecified: Secondary | ICD-10-CM | POA: Diagnosis present

## 2011-09-14 DIAGNOSIS — F329 Major depressive disorder, single episode, unspecified: Secondary | ICD-10-CM | POA: Insufficient documentation

## 2011-09-14 DIAGNOSIS — R079 Chest pain, unspecified: Principal | ICD-10-CM | POA: Diagnosis present

## 2011-09-14 DIAGNOSIS — F1011 Alcohol abuse, in remission: Secondary | ICD-10-CM | POA: Insufficient documentation

## 2011-09-14 DIAGNOSIS — Z9119 Patient's noncompliance with other medical treatment and regimen: Secondary | ICD-10-CM

## 2011-09-14 DIAGNOSIS — F419 Anxiety disorder, unspecified: Secondary | ICD-10-CM | POA: Diagnosis present

## 2011-09-14 HISTORY — DX: Depression, unspecified: F32.A

## 2011-09-14 HISTORY — DX: Essential (primary) hypertension: I10

## 2011-09-14 HISTORY — DX: Bipolar disorder, current episode depressed, mild: F31.31

## 2011-09-14 HISTORY — DX: Major depressive disorder, single episode, unspecified: F32.9

## 2011-09-14 LAB — CBC
HCT: 40.3 % (ref 36.0–46.0)
Hemoglobin: 13.2 g/dL (ref 12.0–15.0)
MCH: 29.5 pg (ref 26.0–34.0)
MCHC: 32.8 g/dL (ref 30.0–36.0)
MCV: 90 fL (ref 78.0–100.0)
Platelets: 221 10*3/uL (ref 150–400)
RBC: 4.48 MIL/uL (ref 3.87–5.11)
RDW: 12.9 % (ref 11.5–15.5)
WBC: 6.2 10*3/uL (ref 4.0–10.5)

## 2011-09-14 LAB — BASIC METABOLIC PANEL
BUN: 13 mg/dL (ref 6–23)
CO2: 25 mEq/L (ref 19–32)
Calcium: 9.4 mg/dL (ref 8.4–10.5)
Chloride: 104 mEq/L (ref 96–112)
Creatinine, Ser: 0.63 mg/dL (ref 0.50–1.10)
GFR calc Af Amer: >90 mL/min (ref >90)
GFR calc non Af Amer: >90 mL/min (ref >90)
Glucose, Bld: 81 mg/dL (ref 70–99)
Potassium: 3.7 mEq/L (ref 3.5–5.1)
Sodium: 141 mEq/L (ref 135–145)

## 2011-09-14 LAB — POCT I-STAT TROPONIN I: Troponin i, poc: 0 ng/mL (ref 0.00–0.08)

## 2011-09-14 MED ORDER — NITROGLYCERIN 2 % TD OINT
1.0000 [in_us] | TOPICAL_OINTMENT | Freq: Once | TRANSDERMAL | Status: AC
  Administered 2011-09-14: 66.6667 [in_us] via TOPICAL
  Filled 2011-09-14: qty 1

## 2011-09-14 MED ORDER — ONDANSETRON HCL 4 MG/2ML IJ SOLN
INTRAMUSCULAR | Status: AC
  Administered 2011-09-14: 4 mg
  Filled 2011-09-14: qty 2

## 2011-09-14 MED ORDER — ASPIRIN EC 325 MG PO TBEC
325.0000 mg | DELAYED_RELEASE_TABLET | Freq: Every day | ORAL | Status: DC
  Administered 2011-09-15 – 2011-09-16 (×2): 325 mg via ORAL
  Filled 2011-09-14 (×2): qty 1

## 2011-09-14 MED ORDER — SODIUM CHLORIDE 0.9 % IJ SOLN
3.0000 mL | Freq: Two times a day (BID) | INTRAMUSCULAR | Status: DC
  Administered 2011-09-15 – 2011-09-16 (×3): 3 mL via INTRAVENOUS

## 2011-09-14 MED ORDER — ACETAMINOPHEN 650 MG RE SUPP: 650.0000 mg | Freq: Four times a day (QID) | RECTAL | Status: DC | PRN

## 2011-09-14 MED ORDER — ALPRAZOLAM 0.5 MG PO TABS
ORAL_TABLET | ORAL | Status: AC
  Filled 2011-09-14: qty 1

## 2011-09-14 MED ORDER — ONDANSETRON HCL 4 MG/2ML IJ SOLN
4.0000 mg | Freq: Once | INTRAMUSCULAR | Status: AC
  Administered 2011-09-14: 4 mg via INTRAVENOUS
  Filled 2011-09-14: qty 2

## 2011-09-14 MED ORDER — ALPRAZOLAM 0.5 MG PO TABS
0.5000 mg | ORAL_TABLET | Freq: Once | ORAL | Status: AC
  Administered 2011-09-14: 0.5 mg via ORAL

## 2011-09-14 MED ORDER — ONDANSETRON HCL 4 MG/2ML IJ SOLN: 4.0000 mg | Freq: Four times a day (QID) | INTRAMUSCULAR | Status: DC | PRN

## 2011-09-14 MED ORDER — ONDANSETRON HCL 4 MG PO TABS: 4.0000 mg | ORAL_TABLET | Freq: Four times a day (QID) | ORAL | Status: DC | PRN

## 2011-09-14 MED ORDER — CLONIDINE HCL 0.1 MG PO TABS
0.1000 mg | ORAL_TABLET | Freq: Two times a day (BID) | ORAL | Status: DC
  Administered 2011-09-15 – 2011-09-16 (×4): 0.1 mg via ORAL
  Filled 2011-09-14 (×5): qty 1

## 2011-09-14 MED ORDER — ACETAMINOPHEN 325 MG PO TABS
650.0000 mg | ORAL_TABLET | Freq: Four times a day (QID) | ORAL | Status: DC | PRN
  Administered 2011-09-15: 650 mg via ORAL
  Filled 2011-09-14: qty 2

## 2011-09-14 NOTE — ED Notes (Signed)
MD at bedside. 

## 2011-09-14 NOTE — ED Notes (Signed)
Report given to Michael, EMT.

## 2011-09-14 NOTE — ED Notes (Signed)
EKGs handed to Judd Lien, MD

## 2011-09-14 NOTE — H&P (Signed)
Danielle [name] is an 60 y.o. female.   Chief Complaint: Chest pain. HPI: 60 year old female with history of hypertension, previous history of cocaine abuse and alcohol abuse patient states she's been off these habits last 6 months presents with chest pain. The chest pain is more on the left side, last for 10-15 minutes and happens once or twice a day. The pain is more like dull aching sometimes has relation to exertion. Denies any shortness of breath cough or phlegm. Denies any fever chills has some nausea denies any abdominal pain. In the ER patient had EKG cardiac enzymes and chest x-rays all of which does not show any thing acute. Patient will be admitted for further observation. Patient has had cardiac catheter in June 2011, which showed normal coronaries.  Past Medical History  Diagnosis Date  . Hypertension   . Asthma   . Depression     Past Surgical History  Procedure Date  . Abdominal hysterectomy     History reviewed. No pertinent family history. Social History:  reports that she has quit smoking. She does not have any smokeless tobacco history on file. She reports that she does not drink alcohol. Her drug history not on file.  Allergies:  Allergies  Allergen Reactions  . Doxycycline     swelling  . Fosinopril Sodium     REACTION: swelling face,lips-angioedema from ACE I  . Penicillins     swelling  . Sulfonamide Derivatives     unknown    Medications Prior to Admission  Medication Dose Route Frequency Provider Last Rate Last Dose  . ALPRAZolam (XANAX) 0.5 MG tablet           . ALPRAZolam Prudy Feeler) tablet 0.5 mg  0.5 mg Oral Once Otilio Miu, PA   0.5 mg at 09/14/11 1741  . nitroGLYCERIN (NITROGLYN) 2 % ointment 1 inch  1 inch Topical Once Arie Sabina Rupert, [place]   16.1096 inch at 09/14/11 2112  . ondansetron (ZOFRAN) 4 MG/2ML injection        4 mg at 09/14/11 1740  . ondansetron (ZOFRAN) injection 4 mg  4 mg Intravenous Once Otilio Miu, PA    4 mg at 09/14/11 2112   No current outpatient prescriptions on file as of 09/14/2011.    Results for orders placed during the hospital encounter of 09/14/11 (from the past 48 hour(s))  CBC     Status: Normal   Collection Time   09/14/11  6:07 PM      Component Value Range Comment   WBC 6.2  4.0 - 10.5 (K/uL)    RBC 4.48  3.87 - 5.11 (MIL/uL)    Hemoglobin 13.2  12.0 - 15.0 (g/dL)    HCT 04.5  40.9 - 81.1 (%)    MCV 90.0  78.0 - 100.0 (fL)    MCH 29.5  26.0 - 34.0 (pg)    MCHC 32.8  30.0 - 36.0 (g/dL)    RDW 91.4  78.2 - 95.6 (%)    Platelets 221  150 - 400 (K/uL)   BASIC METABOLIC PANEL     Status: Normal   Collection Time   09/14/11  6:07 PM      Component Value Range Comment   Sodium 141  135 - 145 (mEq/L)    Potassium 3.7  3.5 - 5.1 (mEq/L)    Chloride 104  96 - 112 (mEq/L)    CO2 25  19 - 32 (mEq/L)    Glucose, Bld 81  70 -  99 (mg/dL)    BUN 13  6 - 23 (mg/dL)    Creatinine, Ser 1.61  0.50 - 1.10 (mg/dL)    Calcium 9.4  8.4 - 10.5 (mg/dL)    GFR calc non Af Amer >90  >90 (mL/min)    GFR calc Af Amer >90  >90 (mL/min)   POCT I-STAT TROPONIN I     Status: Normal   Collection Time   09/14/11  8:30 PM      Component Value Range Comment   Troponin i, poc 0.00  0.00 - 0.08 (ng/mL)    Comment 3             Dg Chest 2 View  09/14/2011  *RADIOLOGY REPORT*  Clinical Data: Chest pain.  Cardiac murmur.  Hypertension.  CHEST - 2 VIEW  Comparison: 11/11/2010  Findings: Cardiomegaly noted without pulmonary edema. The lungs appear clear.  No pleural effusion is observed.  Thoracic spondylosis is present.  IMPRESSION:  1.  Mild cardiomegaly, without pulmonary edema.  Original Report Authenticated By: Dellia Cloud, M.D.    Review of Systems  Constitutional: Negative.   Eyes: Negative.   Cardiovascular: Positive for chest pain.  Gastrointestinal: Negative.   Genitourinary: Negative.   Musculoskeletal: Negative.   Skin: Negative.   Neurological: Negative.     Endo/Heme/Allergies: Negative.   Psychiatric/Behavioral: Negative.     Blood pressure 151/94, pulse 83, temperature 99.2 F (37.3 C), resp. rate 16, height 5\' 1"  (1.549 m), weight 63.957 kg (141 lb), SpO2 98.00%. Physical Exam  Constitutional: She is oriented to person, place, and time. She appears well-developed and well-nourished.  HENT:  Head: Normocephalic and atraumatic.  Right Ear: External ear normal.  Left Ear: External ear normal.  Nose: Nose normal.  Mouth/Throat: Oropharynx is clear and moist.  Eyes: Conjunctivae and EOM are normal. Pupils are equal, round, and reactive to light.  Neck: Normal range of motion. Neck supple.  Cardiovascular: Normal rate, regular rhythm, normal heart sounds and intact distal pulses.   Respiratory: Effort normal and breath sounds normal.  GI: Soft. Bowel sounds are normal.  Musculoskeletal: Normal range of motion.  Neurological: She is alert and oriented to person, place, and time. She has normal reflexes. No cranial nerve deficit. Coordination normal.  Skin: Skin is warm and dry.  Psychiatric: Her behavior is normal.     Assessment/Plan #1. Chest pain to rule out ACS. #2. History of cardiac catheter in 2011, which showed normal coronaries. #3. History of cocaine abuse, patient states she's off cocaine for last 6 months. #4. History of hypertension. #5. History of anxiety.  Plan Admit to telemetry We will cycle cardiac markers and check lipase and d-dimer. We'll check urine drug screen. Further recommendations based on clinical course and test ordered.  Eduard Clos. 09/14/2011, 10:16 PM

## 2011-09-14 NOTE — ED Notes (Signed)
Pt reports return of chest pain, describes as dull, rates 8/10 and reports nausea and sob with the pain.  Pts breathing is not labored and RR = 18.  Pt reports that the o2 is helping her.

## 2011-09-14 NOTE — ED Notes (Signed)
Pt here with c/o substernal chest pain x 3 weeks.  Pain has been intermittent and upon arrival here pt reports that she is chest pain free.  Pt reports pain 8/10 today initially.  Pt has had baby aspirin ans sl nitro pta.  Pt alert and oriented x4.

## 2011-09-14 NOTE — ED Provider Notes (Signed)
Medical screening examination/treatment/procedure(s) were performed by non-physician practitioner and as supervising physician I was immediately available for consultation/collaboration.   Kaveh Kissinger, MD 09/14/11 2307 

## 2011-09-14 NOTE — ED Provider Notes (Signed)
History     CSN: [id number] Arrival date & time: 09/14/2011  4:31 PM   First MD Initiated Contact with Patient 09/14/11 1636      Chief Complaint  Patient presents with  . Chest Pain    (Consider location/radiation/quality/duration/timing/severity/associated sxs/prior treatment) HPI History provided by pt.   Pt has had intermittent, non-radiating, mid-line CP for the past 2 weeks.  Feels worst in the middle of the night as well and occasionally occurs when shes walking during the day.  Associated w/ SOB, diaphoresis and N/V.  Pt has HTN and hyperlipidemia and is non-compliant w/ medications.  Mother had MI in her 51s.  Does not smoke cigarettes.  No prior cardiac history.  Also had 4-5 falls while walking over the past 4-5 months and attributes to losing balance.  Loss of balance no necessarily associated w/ CP.    Past Medical History  Diagnosis Date  . Hypertension   . Asthma   . Depression     No past surgical history on file.  No family history on file.  History  Substance Use Topics  . Smoking status: Not on file  . Smokeless tobacco: Not on file  . Alcohol Use: Not on file    OB History    Grav Para Term Preterm Abortions TAB SAB Ect Mult Living                  Review of Systems  All other systems reviewed and are negative.    Allergies  Doxycycline; Fosinopril sodium; Penicillins; and Sulfonamide derivatives  Home Medications  No current outpatient prescriptions on file.  SpO2 98%  Physical Exam  Nursing note and vitals reviewed. Constitutional: She is oriented to person, place, and time. She appears well-developed and well-nourished. No distress.  HENT:  Head: Normocephalic and atraumatic.  Eyes:       Normal appearance  Neck: Normal range of motion.  Cardiovascular: Normal rate and regular rhythm.        Mildy hypertensive  Pulmonary/Chest: Effort normal and breath sounds normal. No respiratory distress.       Mild tenderness of sternum    Abdominal: Soft. Bowel sounds are normal. She exhibits no distension. There is no tenderness. There is no guarding.  Musculoskeletal: She exhibits no edema and no tenderness.       2+ Dorsalis Pedis pulses  Neurological: She is alert and oriented to person, place, and time.  Skin: Skin is warm and dry. No rash noted. She is not diaphoretic.  Psychiatric: She has a normal mood and affect. Her behavior is normal.    ED Course  Procedures (including critical care time)  Date: 09/14/2011  Rate: 92  Rhythm: normal sinus rhythm  QRS Axis: normal  Intervals: normal  ST/T Wave abnormalities: normal  Conduction Disutrbances:none  Narrative Interpretation: baseline wander  Old EKG Reviewed: unchanged   Date: 09/14/2011  Rate: 79  Rhythm: normal sinus rhythm  QRS Axis: normal  Intervals: normal  ST/T Wave abnormalities: normal  Conduction Disutrbances:none  Narrative Interpretation:   Old EKG Reviewed: unchanged      Labs Reviewed  CBC  BASIC METABOLIC PANEL  POCT I-STAT TROPONIN I  TROPONIN I   Dg Chest 2 View  09/14/2011  *RADIOLOGY REPORT*  Clinical Data: Chest pain.  Cardiac murmur.  Hypertension.  CHEST - 2 VIEW  Comparison: 11/11/2010  Findings: Cardiomegaly noted without pulmonary edema. The lungs appear clear.  No pleural effusion is observed.  Thoracic spondylosis is present.  IMPRESSION:  1.  Mild cardiomegaly, without pulmonary edema.  Original Report Authenticated By: Dellia Cloud, M.D.     1. Chest pain       MDM  Pt w/ several RF for ACS presents w/ intermittent, exertional CP x 2 weeks.  Received aspirin en route to hospital.  Non-compliant w/ medications.  Exam sig for no fever, mild HTN, RRR, lungs CTA, sternal ttp, abd benign/non-tender and no LE edema/ttp.  EKG non-ischemic.  Labs pending.   Pt vomiting.  Zofran ordered.  Pt also requested her pm dose 0.5mg  xanax.  EKG repeated because pain seems to be getting worse and it is unchanged from  prior.  6:12 PM   Troponin neg and labs otherwise unremarkable.  VSS.  Pt continues to have pain and appears uncomfortable.  Nitro paste applied and pain improved.  Pt appears more comfortable.  She is sitting up in bed eating. VSS.  [facility] consulted for admission d/t several RF, typical characteristics of pain and persistent pain in ED.  9:42 PM     Otilio Miu, PA 09/14/11 2146  Otilio Miu, PA 09/14/11 2146

## 2011-09-14 NOTE — ED Notes (Signed)
Pt back from being out of the department; placed pt back on monitor, continuous pulse oximetry, blood pressure cuff and oxygen [place] (2L); family at bedside

## 2011-09-14 NOTE — ED Notes (Signed)
Phlebotomy called to draw troponin.

## 2011-09-14 NOTE — ED Notes (Signed)
Family at bedside. 

## 2011-09-14 NOTE — ED Notes (Signed)
Per EMS:  Pt here with a 3 week history of chest pain.  Pt went to [facility] to get her BP meds and told them of her chest pain, they sent her here for further eval.  Pt recvd 1 sl nitro and 324 mg aspirin and is now reporting that she is pain free.  Initially pain was 8/10.

## 2011-09-14 NOTE — ED Notes (Signed)
Pt undressed, in gown, on monitor, continuous pulse oximetry, blood pressure cuff and oxygen [place] (2L); EKG performed; family at bedside 

## 2011-09-14 NOTE — ED Notes (Signed)
Patient is resting comfortably. 

## 2011-09-14 NOTE — ED Notes (Signed)
Vital signs stable. 

## 2011-09-14 NOTE — ED Notes (Signed)
Snack provided per MD

## 2011-09-15 LAB — COMPREHENSIVE METABOLIC PANEL
ALT: 9 U/L (ref 0–35)
AST: 17 U/L (ref 0–37)
Albumin: 3.1 g/dL — ABNORMAL LOW (ref 3.5–5.2)
Alkaline Phosphatase: 74 U/L (ref 39–117)
BUN: 14 mg/dL (ref 6–23)
CO2: 24 mEq/L (ref 19–32)
Calcium: 9.1 mg/dL (ref 8.4–10.5)
Chloride: 104 mEq/L (ref 96–112)
Creatinine, Ser: 0.74 mg/dL (ref 0.50–1.10)
GFR calc Af Amer: >90 mL/min (ref >90)
GFR calc non Af Amer: >90 mL/min (ref >90)
Glucose, Bld: 118 mg/dL — ABNORMAL HIGH (ref 70–99)
Potassium: 3.9 mEq/L (ref 3.5–5.1)
Sodium: 140 mEq/L (ref 135–145)
Total Bilirubin: 0.2 mg/dL — ABNORMAL LOW (ref 0.3–1.2)
Total Protein: 6.7 g/dL (ref 6.0–8.3)

## 2011-09-15 LAB — LIPASE, BLOOD: Lipase: 82 U/L — ABNORMAL HIGH (ref 11–59)

## 2011-09-15 LAB — CBC
HCT: 38.6 % (ref 36.0–46.0)
Hemoglobin: 12.7 g/dL (ref 12.0–15.0)
MCH: 29.9 pg (ref 26.0–34.0)
MCHC: 32.9 g/dL (ref 30.0–36.0)
MCV: 90.8 fL (ref 78.0–100.0)
Platelets: 223 10*3/uL (ref 150–400)
RBC: 4.25 MIL/uL (ref 3.87–5.11)
RDW: 13.2 % (ref 11.5–15.5)
WBC: 5 10*3/uL (ref 4.0–10.5)

## 2011-09-15 LAB — CARDIAC PANEL(CRET KIN+CKTOT+MB+TROPI)
CK, MB: 4.3 ng/mL — ABNORMAL HIGH (ref 0.3–4.0)
CK, MB: 3.7 ng/mL (ref 0.3–4.0)
CK, MB: 3.3 ng/mL (ref 0.3–4.0)
Relative Index: 3.8 — ABNORMAL HIGH (ref 0.0–2.5)
Relative Index: INVALID (ref 0.0–2.5)
Relative Index: INVALID (ref 0.0–2.5)
Total CK: 113 U/L (ref 7–177)
Total CK: 90 U/L (ref 7–177)
Total CK: 87 U/L (ref 7–177)
Troponin I: <0.3 ng/mL (ref <0.30)
Troponin I: <0.3 ng/mL (ref <0.30)
Troponin I: <0.3 ng/mL (ref <0.30)

## 2011-09-15 LAB — RAPID URINE DRUG SCREEN, HOSP PERFORMED
Amphetamines: NOT DETECTED
Barbiturates: NOT DETECTED
Benzodiazepines: POSITIVE — AB
Cocaine: NOT DETECTED
Opiates: NOT DETECTED
Tetrahydrocannabinol: NOT DETECTED

## 2011-09-15 LAB — MAGNESIUM: Magnesium: 2 mg/dL (ref 1.5–2.5)

## 2011-09-15 LAB — TSH: TSH: 2.119 u[IU]/mL (ref 0.350–4.500)

## 2011-09-15 LAB — D-DIMER, QUANTITATIVE: D-Dimer, Quant: 0.37 ug/mL-FEU (ref 0.00–0.48)

## 2011-09-15 MED ORDER — SENNA 8.6 MG PO TABS
2.0000 | ORAL_TABLET | Freq: Every day | ORAL | Status: DC
  Administered 2011-09-15: 17.2 mg via ORAL
  Filled 2011-09-15 (×3): qty 2

## 2011-09-15 MED ORDER — ALUM & MAG HYDROXIDE-SIMETH 400-400-40 MG/5ML PO SUSP: 30.0000 mL | Freq: Four times a day (QID) | ORAL | Status: DC | PRN

## 2011-09-15 MED ORDER — PANTOPRAZOLE SODIUM 40 MG PO TBEC
40.0000 mg | DELAYED_RELEASE_TABLET | Freq: Every day | ORAL | Status: DC
  Administered 2011-09-15: 40 mg via ORAL
  Filled 2011-09-15: qty 1

## 2011-09-15 MED ORDER — DOCUSATE SODIUM 100 MG PO CAPS
100.0000 mg | ORAL_CAPSULE | Freq: Two times a day (BID) | ORAL | Status: DC
  Administered 2011-09-15 – 2011-09-16 (×2): 100 mg via ORAL
  Filled 2011-09-15 (×3): qty 1

## 2011-09-15 MED ORDER — ALPRAZOLAM 0.5 MG PO TABS
0.5000 mg | ORAL_TABLET | Freq: Two times a day (BID) | ORAL | Status: DC | PRN
  Administered 2011-09-15 – 2011-09-16 (×2): 0.5 mg via ORAL
  Filled 2011-09-15 (×2): qty 1

## 2011-09-15 MED ORDER — ALUM & MAG HYDROXIDE-SIMETH 200-200-20 MG/5ML PO SUSP
30.0000 mL | Freq: Four times a day (QID) | ORAL | Status: DC | PRN
  Administered 2011-09-15: 30 mL via ORAL
  Filled 2011-09-15: qty 30

## 2011-09-15 MED ORDER — POLYETHYLENE GLYCOL 3350 17 G PO PACK
17.0000 g | PACK | Freq: Every day | ORAL | Status: DC
  Administered 2011-09-15 – 2011-09-16 (×2): 17 g via ORAL
  Filled 2011-09-15 (×2): qty 1

## 2011-09-15 MED ORDER — ZOLPIDEM TARTRATE 5 MG PO TABS: 5.0000 mg | ORAL_TABLET | Freq: Every evening | ORAL | Status: DC | PRN

## 2011-09-15 NOTE — Progress Notes (Signed)
Arrived to floor reported she could not walk from stretcher in hallway to bed in room, pt. Stood pivoted and sat down on bed, c/o CP 02 4L started, EKG obtained vss reports pain 4/10. Cont. To rest, nitro paste remains on chest. After approx. 5 min. Pt. Notes pain has faded away.

## 2011-09-15 NOTE — Progress Notes (Signed)
09/15/2011 Kery Batzel [facility] Case Management Note [phone]        Utilization review completed.  

## 2011-09-15 NOTE — Progress Notes (Signed)
PROGRESS NOTE  Danielle [name] ZOX:[id number] DOB: [birth year] DOA: 09/14/2011 PCP: Dala Dock  Brief narrative: 60 year old woman who presented with 3 week history of chest pain. Pain apparently improved with nitroglycerin. Patient was admitted for chest pain, rule out myocardial infarction. Urine drug shows positive for benzodiazepines, chest x-ray was unremarkable, initial cardiac enzymes were negative.  Past medical history: Hypertension, asthma, depression, cocaine abuse, alcohol abuse, cardiac catheterization June 2011:  Consultants:  None  Procedures:  None.  Interim History: None reported. Subjective: Main complaint is constipation. There has been some time since she has had a bowel movement. She thinks the combination of this and gas may be causing her pain. Overall she continues to feel poorly and does report some shortness of breath.  Objective: Filed Vitals:   09/15/11 1400  BP: 120/81  Pulse: 78  Temp: 98.2 F (36.8 C)  Resp: 16   Blood pressure 120/81, pulse 78, temperature 98.2 F (36.8 C), temperature source Oral, resp. rate 16, height 5\' 1"  (1.549 m), weight 66.3 kg (146 lb 2.6 oz), SpO2 96.00%.   Intake/Output Summary (Last 24 hours) at 09/15/11 1446 Last data filed at 09/15/11 1300  Gross per 24 hour  Intake    280 ml  Output    500 ml  Net   -220 ml    Exam: General: Lying flat in bed. Mildly anxious but comfortable. Cardiovascular: Regular rate and rhythm. No murmur, rub or gallop. No lobe show any edema. Telemetry: Sinus rhythm with no arrhythmias. Respiratory: Clear to auscultation bilaterally. No wheezes, rales or rhonchi. Normal respiratory effort.  Data Reviewed: Basic Metabolic Panel:  Lab 09/15/11 8119 09/14/11 1807  NA 140 141  K 3.9 3.7  CL 104 104  CO2 24 25  GLUCOSE 118* 81  BUN 14 13  CREATININE 0.74 0.63  CALCIUM 9.1 9.4  MG 2.0 --  PHOS -- --   Liver Function Tests:  Lab 09/15/11 0705  AST 17  ALT 9  ALKPHOS 74    BILITOT 0.2*  PROT 6.7  ALBUMIN 3.1*    Lab 09/14/11 2351  LIPASE 82*  AMYLASE --   CBC:  Lab 09/15/11 0705 09/14/11 1807  WBC 5.0 6.2  NEUTROABS -- --  HGB 12.7 13.2  HCT 38.6 40.3  MCV 90.8 90.0  PLT 223 221   Cardiac Enzymes:  Lab 09/15/11 0821 09/14/11 2350  CKTOTAL 90 113  CKMB 3.7 4.3*  CKMBINDEX -- --  TROPONINI <0.30 <0.30   Studies: Dg Chest 2 View  09/14/2011  *RADIOLOGY REPORT*  Clinical Data: Chest pain.  Cardiac murmur.  Hypertension.  CHEST - 2 VIEW  Comparison: 11/11/2010  Findings: Cardiomegaly noted without pulmonary edema. The lungs appear clear.  No pleural effusion is observed.  Thoracic spondylosis is present.  IMPRESSION:  1.  Mild cardiomegaly, without pulmonary edema.  Original Report Authenticated By: Dellia Cloud, M.D.    Scheduled Meds:   . ALPRAZolam      . ALPRAZolam  0.5 mg Oral Once  . aspirin EC  325 mg Oral Daily  . cloNIDine  0.1 mg Oral BID  . nitroGLYCERIN  1 inch Topical Once  . ondansetron      . ondansetron (ZOFRAN) IV  4 mg Intravenous Once  . sodium chloride  3 mL Intravenous Q12H   Continuous Infusions:    Assessment/Plan: 1. Chest pain: Multifactorial in nature. Noncardiac most likely. Probably related to constipation and gas. Continue cardiac enzyme rule out. If this is negative and 2-D echocardiogram  was unremarkable would not pursue further evaluation. 2. Hypertension: Noncompliant. 3. Anxiety: Continue Xanax. 4. Constipation: Start bowel regimen. 5. Noncompliance: Discussed importance of compliance with the patient.  Code Status: Full.  Disposition Plan: Home when improved. Anticipate possible discharge tomorrow November 29.   Brendia Sacks, MD  [facility] Pager [phone] 09/15/2011, 2:46 PM    LOS: 1 day

## 2011-09-15 NOTE — Progress Notes (Signed)
Spoke with patient about [facility] and made first available follow up appt with Dr. Andrey Campanile for 11/04/11 at 3:15pm. Patient has active orange card and gets meds at [facility]. Placed appt in Epic and gave appt info to patient as well.

## 2011-09-16 DIAGNOSIS — Z9119 Patient's noncompliance with other medical treatment and regimen: Secondary | ICD-10-CM

## 2011-09-16 DIAGNOSIS — K5900 Constipation, unspecified: Secondary | ICD-10-CM | POA: Diagnosis present

## 2011-09-16 DIAGNOSIS — F419 Anxiety disorder, unspecified: Secondary | ICD-10-CM | POA: Diagnosis present

## 2011-09-16 MED ORDER — DSS 100 MG PO CAPS: 100.0000 mg | ORAL_CAPSULE | Freq: Two times a day (BID) | ORAL | Status: AC

## 2011-09-16 MED ORDER — ASPIRIN 81 MG PO TBEC: 81.0000 mg | DELAYED_RELEASE_TABLET | Freq: Every day | ORAL | Status: DC

## 2011-09-16 MED ORDER — POLYETHYLENE GLYCOL 3350 17 G PO PACK: 17.0000 g | PACK | Freq: Every day | ORAL | Status: AC

## 2011-09-16 MED ORDER — FAMOTIDINE 20 MG PO TABS: 20.0000 mg | ORAL_TABLET | Freq: Two times a day (BID) | ORAL | Status: DC | PRN

## 2011-09-16 MED ORDER — CLONIDINE HCL 0.1 MG PO TABS: 0.1000 mg | ORAL_TABLET | Freq: Two times a day (BID) | ORAL | Status: DC

## 2011-09-16 MED ORDER — ALPRAZOLAM 0.5 MG PO TABS: 0.5000 mg | ORAL_TABLET | Freq: Two times a day (BID) | ORAL | Status: AC | PRN

## 2011-09-16 NOTE — Progress Notes (Signed)
09/16/2011 Danielle [name] [facility] Case Management Note [phone]       Utilization review completed.  

## 2011-09-16 NOTE — Discharge Summary (Signed)
Physician Discharge Summary  Danielle [name] UJW:[id number] DOB: 1951/06/03 DOA: 09/14/2011  PCP: Dala Dock  Admit date: 09/14/2011 Discharge date: 09/16/2011  Discharge Diagnoses:  1. Chest pain, noncardiac 2. Hypertension, stable 3. Anxiety, stable 4. Constipation, stable 5. History of noncompliance with medications.  Discharge Condition: Improved  Disposition: Home.  History of present illness:  60 year old woman who presented with 3 week history of chest pain. Pain apparently improved with nitroglycerin. Patient was admitted for chest pain, rule out myocardial infarction. Urine drug shows positive for benzodiazepines, chest x-ray was unremarkable, initial cardiac enzymes were negative.  Hospital Course:  Patient was admitted to the medical floor and ruled out external cardiac enzymes. Her chest pain has resolved. She has had a cardiac catheterization in 2006 which was essentially unremarkable. She feels like her symptoms most likely related to gas, constipation. She is said improvement with current therapy. At this point not recommend any further evaluation. He is to be more compliant with her antihypertensives and followup with her primary care physician.  1. Chest pain: Multifactorial in nature. Noncardiac most likely. Probably related to constipation and gas. Continue cardiac enzyme rule out. Will defer 2-D echocardiogram to the outpatient setting if primary care physician wishes to pursue.  2. Hypertension: Stable on clonidine.  3. Anxiety: Stable on Xanax.  4. Constipation: Continue bowel regimen. This is a chronic issue.  5. Noncompliance: I again discussed the importance of compliance with the patient.  Consultants:  None  Procedures:  None.  Discharge Instructions  Discharge Orders    Future Orders Please Complete By Expires   Diet - low sodium heart healthy      Increase activity slowly      Call MD for:  severe uncontrolled pain      Discharge  instructions      Comments:   Be sure to take your blood pressure medications as directed and followup with your primary care physician. Call your physician or return to the emergency room for recurrent chest pain.     Current Discharge Medication List    START taking these medications   Details  ALPRAZolam (XANAX) 0.5 MG tablet Take 1 tablet (0.5 mg total) by mouth 2 (two) times daily as needed for anxiety. Qty: 20 tablet, Refills: 0    aspirin 81 MG EC tablet Take 1 tablet (81 mg total) by mouth daily. Swallow whole. Qty: 30 tablet, Refills: 0    docusate sodium 100 MG CAPS Take 100 mg by mouth 2 (two) times daily. Qty: 10 capsule    famotidine (PEPCID) 20 MG tablet Take 1 tablet (20 mg total) by mouth 2 (two) times daily as needed for heartburn. Qty: 60 tablet, Refills: 0    polyethylene glycol (MIRALAX / GLYCOLAX) packet Take 17 g by mouth daily.      CONTINUE these medications which have CHANGED   Details  cloNIDine (CATAPRES) 0.1 MG tablet Take 1 tablet (0.1 mg total) by mouth 2 (two) times daily. Qty: 60 tablet, Refills: 0       Follow-up Information    Follow up with [facility] on 11/04/2011. (appt with Dr. Andrey Campanile at 3:15pm)    Contact information:   [phone]        Time coordinating discharge: 25 minutes  The results of significant diagnostics from this hospitalization (including imaging, microbiology, ancillary and laboratory) are listed below for reference.    Significant Diagnostic Studies: Dg Chest 2 View  09/14/2011  *RADIOLOGY REPORT*  Clinical Data: Chest pain.  Cardiac murmur.  Hypertension.  CHEST - 2 VIEW  Comparison: 11/11/2010  Findings: Cardiomegaly noted without pulmonary edema. The lungs appear clear.  No pleural effusion is observed.  Thoracic spondylosis is present.  IMPRESSION:  1.  Mild cardiomegaly, without pulmonary edema.  Original Report Authenticated By: Dellia Cloud, M.D.   Labs: Basic Metabolic Panel:  Lab  09/15/11 0705 09/14/11 1807  NA 140 141  K 3.9 3.7  CL 104 104  CO2 24 25  GLUCOSE 118* 81  BUN 14 13  CREATININE 0.74 0.63  CALCIUM 9.1 9.4  MG 2.0 --  PHOS -- --   Liver Function Tests:  Lab 09/15/11 0705  AST 17  ALT 9  ALKPHOS 74  BILITOT 0.2*  PROT 6.7  ALBUMIN 3.1*    Lab 09/14/11 2351  LIPASE 82*  AMYLASE --   CBC:  Lab 09/15/11 0705 09/14/11 1807  WBC 5.0 6.2  NEUTROABS -- --  HGB 12.7 13.2  HCT 38.6 40.3  MCV 90.8 90.0  PLT 223 221   Cardiac Enzymes:  Lab 09/15/11 1508 09/15/11 0821 09/14/11 2350  CKTOTAL 87 90 113  CKMB 3.3 3.7 4.3*  CKMBINDEX -- -- --  TROPONINI <0.30 <0.30 <0.30   EKG shows sinus rhythm with no acute changes. Nonspecific T wave abnormalities.  Signed:  Brendia Sacks, MD  [facility] 09/16/2011, 2:22 PM

## 2011-09-16 NOTE — Progress Notes (Signed)
PROGRESS NOTE  Danielle [name] AOZ:[id number] DOB: 1950-11-03 DOA: 09/14/2011 PCP: Dala Dock  Brief narrative: 60 year old woman who presented with 3 week history of chest pain. Pain apparently improved with nitroglycerin. Patient was admitted for chest pain, rule out myocardial infarction. Urine drug shows positive for benzodiazepines, chest x-ray was unremarkable, initial cardiac enzymes were negative.  Past medical history: Hypertension, asthma, depression, cocaine abuse, alcohol abuse, cardiac catheterization June 2011: No significant epicardial coronary artery disease. Ejection fraction of 50-55% with hypokinesis of the basilar-to-mid inferior wall.  Consultants:  None  Procedures:  None.  Interim History: None reported. Subjective: Still some constipation but feels quite a bit better. Breathing fine. No chest pain.  Objective: Filed Vitals:   09/16/11 0400  BP: 124/87  Pulse: 58  Temp: 98.1 F (36.7 C)  Resp: 18   Blood pressure 124/87, pulse 58, temperature 98.1 F (36.7 C), temperature source Oral, resp. rate 18, height 5\' 1"  (1.549 m), weight 66.3 kg (146 lb 2.6 oz), SpO2 98.00%.   Intake/Output Summary (Last 24 hours) at 09/16/11 1352 Last data filed at 09/16/11 0700  Gross per 24 hour  Intake    840 ml  Output    425 ml  Net    415 ml    Exam: General: Appears well. No apparent anxiety today. Cardiovascular: Regular rate and rhythm. No murmur, rub or gallop. No lower extremity edema. Telemetry: Sinus rhythm with no arrhythmias. Respiratory: Clear to auscultation bilaterally. No wheezes, rales or rhonchi. Normal respiratory effort.  Data Reviewed: Cardiac Enzymes:  Lab 09/15/11 1508 09/15/11 0821 09/14/11 2350  CKTOTAL 87 90 113  CKMB 3.3 3.7 4.3*  CKMBINDEX -- -- --  TROPONINI <0.30 <0.30 <0.30   Scheduled Meds:    . aspirin EC  325 mg Oral Daily  . cloNIDine  0.1 mg Oral BID  . docusate sodium  100 mg Oral BID  . pantoprazole  40 mg Oral  Q0600  . polyethylene glycol  17 g Oral Daily  . senna  2 tablet Oral QHS  . sodium chloride  3 mL Intravenous Q12H   Continuous Infusions:    Assessment/Plan: 1. Chest pain: Multifactorial in nature. Noncardiac most likely. Probably related to constipation and gas. Continue cardiac enzyme rule out. Will defer 2-D echocardiogram to the outpatient setting if primary care physician wishes to pursue. 2. Hypertension: Stable on clonidine. We will continue this at this point. 3. Anxiety: Stable on Xanax. 4. Constipation: Continue bowel regimen. This is a chronic issue. 5. Noncompliance: I again discussed the importance of compliance with the patient.  Code Status: Full.  Disposition Plan: Home today.   Brendia Sacks, MD  [facility] Pager [phone] 09/16/2011, 1:52 PM    LOS: 2 days

## 2011-11-23 ENCOUNTER — Institutional Professional Consult (permissible substitution): Payer: Self-pay | Admitting: Cardiovascular Disease

## 2011-12-21 ENCOUNTER — Other Ambulatory Visit: Payer: Self-pay

## 2011-12-21 ENCOUNTER — Emergency Department (HOSPITAL_COMMUNITY): Payer: Medicare Other

## 2011-12-21 ENCOUNTER — Encounter (HOSPITAL_COMMUNITY): Payer: Self-pay | Admitting: *Deleted

## 2011-12-21 ENCOUNTER — Emergency Department (HOSPITAL_COMMUNITY)
Admission: EM | Admit: 2011-12-21 | Discharge: 2011-12-21 | Disposition: A | Discharge status: Home / Self Care | Payer: Medicare Other | Attending: Emergency Medicine | Admitting: Emergency Medicine

## 2011-12-21 DIAGNOSIS — R079 Chest pain, unspecified: Secondary | ICD-10-CM | POA: Insufficient documentation

## 2011-12-21 DIAGNOSIS — I2510 Atherosclerotic heart disease of native coronary artery without angina pectoris: Secondary | ICD-10-CM | POA: Insufficient documentation

## 2011-12-21 DIAGNOSIS — Z9079 Acquired absence of other genital organ(s): Secondary | ICD-10-CM | POA: Insufficient documentation

## 2011-12-21 DIAGNOSIS — R110 Nausea: Secondary | ICD-10-CM | POA: Insufficient documentation

## 2011-12-21 DIAGNOSIS — R109 Unspecified abdominal pain: Secondary | ICD-10-CM | POA: Insufficient documentation

## 2011-12-21 DIAGNOSIS — R0602 Shortness of breath: Secondary | ICD-10-CM | POA: Insufficient documentation

## 2011-12-21 DIAGNOSIS — E669 Obesity, unspecified: Secondary | ICD-10-CM | POA: Insufficient documentation

## 2011-12-21 DIAGNOSIS — R61 Generalized hyperhidrosis: Secondary | ICD-10-CM | POA: Insufficient documentation

## 2011-12-21 DIAGNOSIS — Z8673 Personal history of transient ischemic attack (TIA), and cerebral infarction without residual deficits: Secondary | ICD-10-CM | POA: Insufficient documentation

## 2011-12-21 DIAGNOSIS — A599 Trichomoniasis, unspecified: Secondary | ICD-10-CM | POA: Insufficient documentation

## 2011-12-21 DIAGNOSIS — J45909 Unspecified asthma, uncomplicated: Secondary | ICD-10-CM | POA: Insufficient documentation

## 2011-12-21 DIAGNOSIS — I10 Essential (primary) hypertension: Secondary | ICD-10-CM | POA: Insufficient documentation

## 2011-12-21 DIAGNOSIS — F3131 Bipolar disorder, current episode depressed, mild: Secondary | ICD-10-CM | POA: Insufficient documentation

## 2011-12-21 DIAGNOSIS — F1410 Cocaine abuse, uncomplicated: Secondary | ICD-10-CM | POA: Insufficient documentation

## 2011-12-21 HISTORY — DX: Cerebral infarction, unspecified: I63.9

## 2011-12-21 HISTORY — DX: Atherosclerotic heart disease of native coronary artery without angina pectoris: I25.10

## 2011-12-21 LAB — URINALYSIS, ROUTINE W REFLEX MICROSCOPIC
Bilirubin Urine: NEGATIVE
Glucose, UA: NEGATIVE mg/dL
Ketones, ur: NEGATIVE mg/dL
Nitrite: NEGATIVE
Protein, ur: NEGATIVE mg/dL
Specific Gravity, Urine: 1.006 (ref 1.005–1.030)
Urobilinogen, UA: 1 mg/dL (ref 0.0–1.0)
pH: 6.5 (ref 5.0–8.0)

## 2011-12-21 LAB — DIFFERENTIAL
Basophils Absolute: 0 10*3/uL (ref 0.0–0.1)
Basophils Relative: 0 % (ref 0–1)
Eosinophils Absolute: 0.2 10*3/uL (ref 0.0–0.7)
Eosinophils Relative: 2 % (ref 0–5)
Lymphocytes Relative: 46 % (ref 12–46)
Lymphs Abs: 3.7 10*3/uL (ref 0.7–4.0)
Monocytes Absolute: 0.8 10*3/uL (ref 0.1–1.0)
Monocytes Relative: 10 % (ref 3–12)
Neutro Abs: 3.3 10*3/uL (ref 1.7–7.7)
Neutrophils Relative %: 41 % — ABNORMAL LOW (ref 43–77)

## 2011-12-21 LAB — RAPID URINE DRUG SCREEN, HOSP PERFORMED
Amphetamines: NOT DETECTED
Barbiturates: NOT DETECTED
Benzodiazepines: NOT DETECTED
Cocaine: POSITIVE — AB
Opiates: NOT DETECTED
Tetrahydrocannabinol: NOT DETECTED

## 2011-12-21 LAB — COMPREHENSIVE METABOLIC PANEL
ALT: 14 U/L (ref 0–35)
AST: 21 U/L (ref 0–37)
Albumin: 3.3 g/dL — ABNORMAL LOW (ref 3.5–5.2)
Alkaline Phosphatase: 78 U/L (ref 39–117)
BUN: 23 mg/dL (ref 6–23)
CO2: 24 mEq/L (ref 19–32)
Calcium: 9.5 mg/dL (ref 8.4–10.5)
Chloride: 102 mEq/L (ref 96–112)
Creatinine, Ser: 1.05 mg/dL (ref 0.50–1.10)
GFR calc Af Amer: 67 mL/min — ABNORMAL LOW (ref >90)
GFR calc non Af Amer: 57 mL/min — ABNORMAL LOW (ref >90)
Glucose, Bld: 107 mg/dL — ABNORMAL HIGH (ref 70–99)
Potassium: 3.1 mEq/L — ABNORMAL LOW (ref 3.5–5.1)
Sodium: 137 mEq/L (ref 135–145)
Total Bilirubin: 0.2 mg/dL — ABNORMAL LOW (ref 0.3–1.2)
Total Protein: 7 g/dL (ref 6.0–8.3)

## 2011-12-21 LAB — CBC
HCT: 39.1 % (ref 36.0–46.0)
Hemoglobin: 13.2 g/dL (ref 12.0–15.0)
MCH: 30.6 pg (ref 26.0–34.0)
MCHC: 33.8 g/dL (ref 30.0–36.0)
MCV: 90.7 fL (ref 78.0–100.0)
Platelets: 255 10*3/uL (ref 150–400)
RBC: 4.31 MIL/uL (ref 3.87–5.11)
RDW: 13.5 % (ref 11.5–15.5)
WBC: 8 10*3/uL (ref 4.0–10.5)

## 2011-12-21 LAB — POCT I-STAT TROPONIN I: Troponin i, poc: 0.01 ng/mL (ref 0.00–0.08)

## 2011-12-21 LAB — URINE MICROSCOPIC-ADD ON

## 2011-12-21 LAB — WET PREP, GENITAL: Yeast Wet Prep HPF POC: NONE SEEN

## 2011-12-21 MED ORDER — ALPRAZOLAM 0.5 MG PO TABS
0.5000 mg | ORAL_TABLET | Freq: Once | ORAL | Status: AC
  Administered 2011-12-21: 0.5 mg via ORAL
  Filled 2011-12-21: qty 1

## 2011-12-21 MED ORDER — NITROGLYCERIN 0.4 MG SL SUBL
0.4000 mg | SUBLINGUAL_TABLET | SUBLINGUAL | Status: DC | PRN
  Administered 2011-12-21 (×2): 0.4 mg via SUBLINGUAL

## 2011-12-21 MED ORDER — ONDANSETRON HCL 4 MG/2ML IJ SOLN
4.0000 mg | Freq: Once | INTRAMUSCULAR | Status: AC
  Administered 2011-12-21: 4 mg via INTRAVENOUS
  Filled 2011-12-21: qty 2

## 2011-12-21 MED ORDER — METRONIDAZOLE 500 MG PO TABS
2000.0000 mg | ORAL_TABLET | Freq: Once | ORAL | Status: AC
  Administered 2011-12-21: 2000 mg via ORAL
  Filled 2011-12-21: qty 4

## 2011-12-21 NOTE — ED Notes (Signed)
Pt states she is pain free now, but is requesting to get clonopin and xanax

## 2011-12-21 NOTE — ED Notes (Signed)
Family assiting pt with putting clothes on and getting ready to be discharged.

## 2011-12-21 NOTE — ED Provider Notes (Signed)
History     CSN: [id number]  Arrival date & time 12/21/11  1758   First MD Initiated Contact with Patient 12/21/11 1803      Chief Complaint  Patient presents with  . Chest Pain    (Consider location/radiation/quality/duration/timing/severity/associated sxs/prior treatment) HPI History provided by the patient and the patient's daughter. History mildly limited secondary to poor historian.  61 year old female with history of CAD and crack cocaine use presenting with complaint of chest pain. Chest pain began earlier today (at unknown time), awakening patient from sleep. The pain is centrally located, constant, described as pressure/dull, radiating to her left chest. Patient reports mild associated shortness of breath, nausea, and sweats without known fever or significant cough. Pain is described as initially 6/10 in severity, but resolving soon after EMS arrival and consumption of aspirin 324 mg; patient currently states that she thinks, "it's coming back."  The pain was worse with sitting and walking. This pain is similar to her prior chest pain. Patient has not been seen recently for her current complaint.  Patient states that the last time she used cocaine was about 2 days ago.    Past Medical History  Diagnosis Date  . Hypertension   . Asthma   . Depression   . Bipolar affective disorder, currently depressed, mild   . Stroke   . Coronary artery disease     Past Surgical History  Procedure Date  . Abdominal hysterectomy   . Cardiac catheterization     No family history on file.  History  Substance Use Topics  . Smoking status: Former Games developer  . Smokeless tobacco: Not on file  . Alcohol Use: Yes     occ  H/o crack cocaine use, last used = recently.  OB History    Grav Para Term Preterm Abortions TAB SAB Ect Mult Living                  Review of Systems  Constitutional: Positive for diaphoresis. Negative for fever and chills.  HENT: Negative for congestion, sore  throat and rhinorrhea.   Eyes: Negative for pain and visual disturbance.  Respiratory: Positive for shortness of breath. Negative for cough and wheezing.   Cardiovascular: Positive for chest pain. Negative for palpitations.  Gastrointestinal: Positive for nausea and abdominal pain. Negative for vomiting, diarrhea and blood in stool.  Genitourinary: Positive for vaginal discharge (Patient reports about 4-5 days of yellow discharge similar to her prior "STD"/trichomonas.). Negative for dysuria and hematuria.  Musculoskeletal: Negative for back pain and gait problem.  Skin: Negative for rash and wound.  Neurological: Negative for dizziness and headaches.  Psychiatric/Behavioral: Negative for confusion and agitation.  All other systems reviewed and are negative.    Allergies  Doxycycline; Fosinopril sodium; Penicillins; and Sulfonamide derivatives  Home Medications   Current Outpatient Rx  Name Route Sig Dispense Refill  . ALPRAZOLAM 0.5 MG PO TABS Oral Take 0.5 mg by mouth 4 (four) times daily.    Marland Kitchen KLONOPIN PO Oral Take 1 tablet by mouth 2 (two) times daily.      BP 125/82  Pulse 77  Temp(Src) 99.4 F (37.4 C) (Oral)  Resp 23  SpO2 99%  Physical Exam  Nursing note and vitals reviewed. Constitutional: She is oriented to person, place, and time.       Moderately obese, appears uncomfortable but no acute distress, speaks somewhat slowly with occasional lip movements consistent with history of tardive dyskinesia, daughter at bedside  HENT:  Head: Normocephalic  and atraumatic.  Right Ear: External ear normal.  Left Ear: External ear normal.  Nose: Nose normal.  Mouth/Throat: Oropharynx is clear and moist.  Eyes: Conjunctivae and EOM are normal. Pupils are equal, round, and reactive to light.  Neck: Normal range of motion. Neck supple.  Cardiovascular: Normal rate, regular rhythm and intact distal pulses.   No murmur heard. Pulmonary/Chest: Effort normal. No respiratory  distress. She has no wheezes. She has no rales. She exhibits tenderness (significantly tender; however, patient stating this is different from her subjective complaint.).  Abdominal: Soft. Bowel sounds are normal. There is no tenderness.       Moderately obese  Musculoskeletal: Normal range of motion. She exhibits no edema.  Neurological: She is alert and oriented to person, place, and time.  Skin: Skin is warm and dry. No rash noted. She is not diaphoretic.  Psychiatric: She has a normal mood and affect. Judgment normal.    ED Course  Procedures (including critical care time)   Date: 12/21/2011  Rate: 80  Rhythm: normal sinus rhythm  QRS Axis: normal  Intervals: normal  ST/T Wave abnormalities: normal other than mild TW flattening in aVL, similar to prior  Old EKG Reviewed:  No significant changes noted   Labs Reviewed  DIFFERENTIAL - Abnormal; Notable for the following:    Neutrophils Relative 41 (*)    All other components within normal limits  URINE RAPID DRUG SCREEN (HOSP PERFORMED) - Abnormal; Notable for the following:    Cocaine POSITIVE (*)    All other components within normal limits  WET PREP, GENITAL - Abnormal; Notable for the following:    Trich, Wet Prep MODERATE (*)    Clue Cells Wet Prep HPF POC FEW (*)    WBC, Wet Prep HPF POC TOO NUMEROUS TO COUNT (*)    All other components within normal limits  URINALYSIS, ROUTINE W REFLEX MICROSCOPIC - Abnormal; Notable for the following:    APPearance HAZY (*)    Hgb urine dipstick SMALL (*)    Leukocytes, UA LARGE (*)    All other components within normal limits  COMPREHENSIVE METABOLIC PANEL - Abnormal; Notable for the following:    Potassium 3.1 (*)    Glucose, Bld 107 (*)    Albumin 3.3 (*)    Total Bilirubin 0.2 (*)    GFR calc non Af Amer 57 (*)    GFR calc Af Amer 67 (*)    All other components within normal limits  URINE MICROSCOPIC-ADD ON - Abnormal; Notable for the following:    Squamous Epithelial / LPF  FEW (*)    All other components within normal limits  CBC  POCT I-STAT TROPONIN I  GC/CHLAMYDIA PROBE AMP, GENITAL  URINE CULTURE   Dg Chest Port 1 View  12/21/2011  *RADIOLOGY REPORT*  Clinical Data: Chest pain, history of heart disease  PORTABLE CHEST - 1 VIEW  Comparison: None.  Findings: Mild cardiac enlargement with vascular congestion. Negative for CHF, pneumonia, collapse, consolidation, effusion or pneumothorax.  Trachea midline.  IMPRESSION: Mild cardiomegaly and vascular congestion  Original Report Authenticated By: Judie Petit. Ruel Favors, M.D.     1. Chest pain   2. Cocaine abuse   3. Trichimoniasis     MDM  61 y.o. F with h/o cocaine abuse and CAD presenting with CP and recent cocaine use plus c/o vag d/c "like her prior trich."  Essentially CP-free at the time of ED arrival.  Exam as above, AF, clear lungs, chest wall  tenderness, sig vag d/c.  Initial EKG without acute ischemia and CXR unremarkable.  Initial trop neg and UDS positive for cocaine.  Wet prep c/w trich; zofran and flagyl 2g given.  UA with WBC and LE likely 2/2 vag d/c; will not Tx for UTI at this time.    Suspect cocaine CP without acute MI.  Pt well-appearing prior to d/c, reporting no pain.  Pt strongly advised to stop drug abuse and f/u with PCP for recheck.  Strict return precautions given.        Particia Lather, MD 12/22/11 [id number]

## 2011-12-21 NOTE — ED Notes (Signed)
Pt was at home and friend called ems for pt having chest pain.  Pt has had diarrhea, no n/v or fever.  Pt woke up with chest pain and it was a 6/10 and described as "heavy".  Pt reports last using crack cocaine on Sunday.  Pt took 4basa at home and is pain free now. Pt has history of stroke and mi, tardive dyskinesa.

## 2011-12-21 NOTE — ED Notes (Signed)
Pt states diarrhea today.  Pt speaks slowly and in broken sentences, baseline per family after stroke.

## 2011-12-21 NOTE — Discharge Instructions (Signed)
Stop using cocaine, as this is likely why you are having chest pain.  See your doctor for follow up and further evaluation.  Return for worsening symptoms or other concerns.    Trichomoniasis Trichomoniasis is an infection, caused by the Trichomonas organism, that affects both women and men. In women, the outer female genitalia and the vagina are affected. In men, the penis is mainly affected, but the prostate and other reproductive organs can also be involved. Trichomoniasis is a sexually transmitted disease (STD) and is most often passed to another person through sexual contact. The majority of people who get trichomoniasis do so from a sexual encounter and are also at risk for other STDs. CAUSES   Sexual intercourse with an infected partner.   It can be present in swimming pools or hot tubs.  SYMPTOMS   Abnormal gray-green frothy vaginal discharge in women.   Vaginal itching and irritation in women.   Itching and irritation of the area outside the vagina in women.   Penile discharge with or without pain in males.   Inflammation of the urethra (urethritis), causing painful urination.   Bleeding after sexual intercourse.  RELATED COMPLICATIONS  Pelvic inflammatory disease.   Infection of the uterus (endometritis).   Infertility.   Tubal (ectopic) pregnancy.   It can be associated with other STDs, including gonorrhea and chlamydia, hepatitis B, and HIV.  COMPLICATIONS DURING PREGNANCY  Early (premature) delivery.   Premature rupture of the membranes (PROM).   Low birth weight.  DIAGNOSIS   Visualization of Trichomonas under the microscope from the vagina discharge.   Ph of the vagina greater than 4.5, tested with a test tape.   Trich Rapid Test.   Culture of the organism, but this is not usually needed.   It may be found on a Pap test.   Having a "strawberry cervix,"which means the cervix looks very red like a strawberry.  TREATMENT   You may be given  medication to fight the infection. Inform your caregiver if you could be or are pregnant. Some medications used to treat the infection should not be taken during pregnancy.   Over-the-counter medications or creams to decrease itching or irritation may be recommended.   Your sexual partner will need to be treated if infected.  HOME CARE INSTRUCTIONS   Take all medication prescribed by your caregiver.   Take over-the-counter medication for itching or irritation as directed by your caregiver.   Do not have sexual intercourse while you have the infection.   Do not douche or wear tampons.   Discuss your infection with your partner, as your partner may have acquired the infection from you. Or, your partner may have been the person who transmitted the infection to you.   Have your sex partner examined and treated if necessary.   Practice safe, informed, and protected sex.   See your caregiver for other STD testing.  SEEK MEDICAL CARE IF:   You still have symptoms after you finish the medication.   You have an oral temperature above 102 F (38.9 C).   You develop belly (abdominal) pain.   You have pain when you urinate.   You have bleeding after sexual intercourse.   You develop a rash.   The medication makes you sick or makes you throw up (vomit).  Document Released: 03/30/2001 Document Revised: 09/23/2011 Document Reviewed: 04/25/2009 [facility] Patient Information 2012 [place], [place].     Cocaine Abuse PROBLEMS FROM USING COCAINE:   Highly addictive.   Illegal.  Risk of sudden death.   Heart disease.   Irregular heart beat.   High blood pressure.   Damage to nose and lungs.   Severe agitation.   Hallucinations.   Violent behavior.   Paranoia.   Sexual dysfunction.  Most cocaine users deny that they have a problem with addiction. The biggest problem is admitting that you are dependent on cocaine. Those trying to quit using it may experience depression  and withdrawal symptoms. Other withdrawal symptoms include fatigue, suicidal thoughts, sleepiness, restlessness, anxiety, and increased craving for cocaine. There are medications available to help prevent depression associated with stopping cocaine. Most users will find a support group or treatment program helpful in coming off and staying off cocaine. The best chance to cure cocaine addiction is to go into group therapy and to be in a drug-free environment. It is very important to develop healthy relationships and avoid socializing with people who use or deal drugs. Eat well, and give your body the proper rest and healthy exercise it needs. You may need medication to help treat withdrawal symptoms. Call your caregiver or a drug treatment center for more help.  You may also want to call the [facility] on Drug Abuse at 800-662-HELP in the [place] SEEK IMMEDIATE MEDICAL CARE IF:  You develop severe chest pain.   You develop shortness of breath.   You develop extreme agitation.  Document Released: 11/11/2004 Document Revised: 09/23/2011 Document Reviewed: 08/06/2009 [facility] Patient Information 2012 [place], [place].   Chest Pain (Nonspecific) Chest pain has many causes. Your pain could be caused by something serious, such as a heart attack or a blood clot in the lungs. It could also be caused by something less serious, such as a chest bruise or a virus. Follow up with your doctor. More lab tests or other studies may be needed to find the cause of your pain. Most of the time, nonspecific chest pain will improve within 2 to 3 days of rest and mild pain medicine. HOME CARE  For chest bruises, you may put ice on the sore area for 15 to 20 minutes, 3 to 4 times a day. Do this only if it makes you or your child feel better.   Put ice in a plastic bag.   Place a towel between the skin and the bag.   Rest for the next 2 to 3 days.   Go back to work if the pain improves.   See your doctor if  the pain lasts longer than 1 to 2 weeks.   Only take medicine as told by your doctor.   Quit smoking if you smoke.  GET HELP RIGHT AWAY IF:   There is more pain or pain that spreads to the arm, neck, jaw, back, or belly (abdomen).   You or your child has shortness of breath.   You or your child coughs more than usual or coughs up blood.   You or your child has very bad back or belly pain, feels sick to his or her stomach (nauseous), or throws up (vomits).   You or your child has very bad weakness.   You or your child passes out (faints).   You or your child has a temperature by mouth above 102 F (38.9 C), not controlled by medicine.  Any of these problems may be serious and may be an emergency. Do not wait to see if the problems will go away. Get medical help right away. Call your local emergency services 911 in  [place] Do not drive yourself to the hospital. MAKE SURE YOU:   Understand these instructions.   Will watch this condition.   Will get help right away if you or your child is not doing well or gets worse.  Document Released: 03/22/2008 Document Revised: 09/23/2011 Document Reviewed: 03/22/2008 [facility] Patient Information 2012 [place], [place].

## 2011-12-21 NOTE — ED Notes (Signed)
Friend of the pt phone number for pt to call if needed. [phone] Ronnie.

## 2011-12-21 NOTE — ED Notes (Signed)
Patient requesting her xanax and klonopin for her TD.  MD notified.  New orders per MD.

## 2011-12-22 LAB — GC/CHLAMYDIA PROBE AMP, GENITAL
Chlamydia, DNA Probe: NEGATIVE
GC Probe Amp, Genital: NEGATIVE

## 2011-12-22 NOTE — ED Provider Notes (Signed)
I saw and evaluated the patient, reviewed the resident's note and I agree with the findings and plan. I personally evaluated the ECG and agree with the interpretation of the resident   Lyanne Co, MD 12/22/11 [phone]

## 2012-01-13 ENCOUNTER — Encounter: Payer: Self-pay | Admitting: Cardiovascular Disease

## 2012-01-21 ENCOUNTER — Emergency Department (HOSPITAL_COMMUNITY)
Admission: EM | Admit: 2012-01-21 | Discharge: 2012-01-21 | Disposition: A | Discharge status: Home / Self Care | Payer: Medicare Other | Attending: Emergency Medicine | Admitting: Emergency Medicine

## 2012-01-21 ENCOUNTER — Encounter (HOSPITAL_COMMUNITY): Payer: Self-pay | Admitting: *Deleted

## 2012-01-21 DIAGNOSIS — F319 Bipolar disorder, unspecified: Secondary | ICD-10-CM

## 2012-01-21 DIAGNOSIS — I2510 Atherosclerotic heart disease of native coronary artery without angina pectoris: Secondary | ICD-10-CM | POA: Insufficient documentation

## 2012-01-21 DIAGNOSIS — I10 Essential (primary) hypertension: Secondary | ICD-10-CM | POA: Insufficient documentation

## 2012-01-21 DIAGNOSIS — F1910 Other psychoactive substance abuse, uncomplicated: Secondary | ICD-10-CM

## 2012-01-21 LAB — RAPID URINE DRUG SCREEN, HOSP PERFORMED
Amphetamines: NOT DETECTED
Barbiturates: NOT DETECTED
Benzodiazepines: POSITIVE — AB
Cocaine: POSITIVE — AB
Opiates: NOT DETECTED
Tetrahydrocannabinol: NOT DETECTED

## 2012-01-21 LAB — BASIC METABOLIC PANEL
BUN: 31 mg/dL — ABNORMAL HIGH (ref 6–23)
CO2: 23 mEq/L (ref 19–32)
Calcium: 10.1 mg/dL (ref 8.4–10.5)
Chloride: 95 mEq/L — ABNORMAL LOW (ref 96–112)
Creatinine, Ser: 1.83 mg/dL — ABNORMAL HIGH (ref 0.50–1.10)
GFR calc Af Amer: 34 mL/min — ABNORMAL LOW (ref >90)
GFR calc non Af Amer: 29 mL/min — ABNORMAL LOW (ref >90)
Glucose, Bld: 115 mg/dL — ABNORMAL HIGH (ref 70–99)
Potassium: 3.3 mEq/L — ABNORMAL LOW (ref 3.5–5.1)
Sodium: 134 mEq/L — ABNORMAL LOW (ref 135–145)

## 2012-01-21 LAB — URINALYSIS, ROUTINE W REFLEX MICROSCOPIC
Glucose, UA: NEGATIVE mg/dL
Hgb urine dipstick: NEGATIVE
Ketones, ur: 15 mg/dL — AB
Nitrite: NEGATIVE
Protein, ur: NEGATIVE mg/dL
Specific Gravity, Urine: 1.017 (ref 1.005–1.030)
Urobilinogen, UA: 1 mg/dL (ref 0.0–1.0)
pH: 6 (ref 5.0–8.0)

## 2012-01-21 LAB — URINE MICROSCOPIC-ADD ON

## 2012-01-21 LAB — CBC
HCT: 42.5 % (ref 36.0–46.0)
Hemoglobin: 15.2 g/dL — ABNORMAL HIGH (ref 12.0–15.0)
MCH: 31.5 pg (ref 26.0–34.0)
MCHC: 35.8 g/dL (ref 30.0–36.0)
MCV: 88.2 fL (ref 78.0–100.0)
Platelets: 288 10*3/uL (ref 150–400)
RBC: 4.82 MIL/uL (ref 3.87–5.11)
RDW: 12.9 % (ref 11.5–15.5)
WBC: 9.5 10*3/uL (ref 4.0–10.5)

## 2012-01-21 LAB — ETHANOL: Alcohol, Ethyl (B): <11 mg/dL (ref 0–11)

## 2012-01-21 MED ORDER — LORAZEPAM 1 MG PO TABS
1.0000 mg | ORAL_TABLET | Freq: Three times a day (TID) | ORAL | Status: DC | PRN
  Administered 2012-01-21: 1 mg via ORAL
  Filled 2012-01-21 (×2): qty 1

## 2012-01-21 MED ORDER — LORAZEPAM 1 MG PO TABS
2.0000 mg | ORAL_TABLET | Freq: Once | ORAL | Status: AC
  Administered 2012-01-21: 2 mg via ORAL
  Filled 2012-01-21: qty 2

## 2012-01-21 MED ORDER — TRIAMTERENE-HCTZ 75-50 MG PO TABS
1.0000 | ORAL_TABLET | Freq: Every day | ORAL | Status: DC
  Administered 2012-01-21: 1 via ORAL
  Filled 2012-01-21: qty 1

## 2012-01-21 MED ORDER — CLONIDINE HCL 0.1 MG PO TABS
0.2000 mg | ORAL_TABLET | Freq: Every day | ORAL | Status: DC
  Administered 2012-01-21: 0.2 mg via ORAL
  Filled 2012-01-21: qty 2

## 2012-01-21 MED ORDER — ONDANSETRON 4 MG PO TBDP
ORAL_TABLET | ORAL | Status: AC
  Filled 2012-01-21: qty 1

## 2012-01-21 MED ORDER — ONDANSETRON 4 MG PO TBDP
4.0000 mg | ORAL_TABLET | Freq: Once | ORAL | Status: AC
  Administered 2012-01-21: 4 mg via ORAL

## 2012-01-21 MED ORDER — ONDANSETRON HCL 8 MG PO TABS
4.0000 mg | ORAL_TABLET | Freq: Three times a day (TID) | ORAL | Status: DC | PRN
  Administered 2012-01-21: 4 mg via ORAL
  Filled 2012-01-21: qty 1

## 2012-01-21 NOTE — ED Notes (Addendum)
Pt states she has addiction to crack and alcohol.  Last used crack Wednesday. Last alcoholic drink Wednesday.  Pt states she was put out onto street after pt's family moved without telling her.  Slight tremor noted by RN.  Pt sees psych at mental health treated for bi-polar and schizo.  Pt states she has rx for xanax for anxiety and hypertension.  Pt alert oriented X4.  Pt feels paranoid and scared of people.  Pt is homeless and has no where to go.

## 2012-01-21 NOTE — ED Notes (Signed)
Patient states she feel like she is shaking inside and appears to be shaking and nervous and requesting ativan.

## 2012-01-21 NOTE — ED Provider Notes (Signed)
History     CSN: [id number]  Arrival date & time 01/21/12  0721   First MD Initiated Contact with Patient 01/21/12 [phone]      Chief Complaint  Patient presents with  . Medical Clearance    (Consider location/radiation/quality/duration/timing/severity/associated sxs/prior treatment) HPI Comments: Patient reports that she has been homeless for the last 3 or 4 days after being kicked out of her home by her children. She reports that she lives with her children and apparently they were moving and did not tell her. She apparently has a history of schizophrenia and bipolar disorder as well as a history of heart dyskinesia and medical problems including hypertension, asthma and a history of coronary disease. Physically she reports she has been fine, specifically denies headache, chest pain, cough, vomiting or diarrhea. She reports that she has a history of crack cocaine and alcohol abuse, and since she has been homeless she has been using alcohol and cocaine again. Her last use was on Wednesday. She denies seizures but endorses feeling very anxious and trembling. She goes to [facility] for medical issues and Mental Health and sees a psychiatrist.  She has not been taking her psychiatric meds due to her children having her purse and she thinks that they took many of her meds from her and she could not retrieve them, even with help from police.  She denies SI or HI, and when I ask about hallucinations, she doesn't endorse any, but states she has had difficulty concentrating the past 2 days due to anxiety and being off meds so she isn't sure.    The history is provided by the patient.    Past Medical History  Diagnosis Date  . Hypertension   . Asthma   . Depression   . Bipolar affective disorder, currently depressed, mild   . Stroke   . Coronary artery disease     Past Surgical History  Procedure Date  . Abdominal hysterectomy   . Cardiac catheterization     History reviewed. No pertinent  family history.  History  Substance Use Topics  . Smoking status: Former Games developer  . Smokeless tobacco: Not on file  . Alcohol Use: Yes     occ    OB History    Grav Para Term Preterm Abortions TAB SAB Ect Mult Living                  Review of Systems  Constitutional: Negative for fever and chills.  Respiratory: Negative for shortness of breath.   Cardiovascular: Negative for chest pain.  Gastrointestinal: Negative for nausea, vomiting and diarrhea.  Neurological: Negative for headaches.  Psychiatric/Behavioral: The patient is nervous/anxious.   All other systems reviewed and are negative.    Allergies  Doxycycline; Fosinopril sodium; Penicillins; and Sulfonamide derivatives  Home Medications   Current Outpatient Rx  Name Route Sig Dispense Refill  . ALPRAZOLAM 0.5 MG PO TABS Oral Take 0.5 mg by mouth 4 (four) times daily.    . ASPIRIN EC 81 MG PO TBEC Oral Take 81 mg by mouth daily.    Marland Kitchen KLONOPIN PO Oral Take 1 tablet by mouth 2 (two) times daily.    Marland Kitchen CLONIDINE HCL 0.2 MG PO TABS Oral Take 0.2 mg by mouth 2 (two) times daily.    . TRIAMTERENE-HCTZ 75-50 MG PO TABS Oral Take 1 tablet by mouth daily.      BP 116/63  Pulse 86  Temp(Src) 97.8 F (36.6 C) (Oral)  Resp 16  SpO2 100%  Physical Exam  Nursing note and vitals reviewed. Constitutional: She is oriented to person, place, and time. She appears well-developed and well-nourished. No distress.  HENT:  Head: Normocephalic and atraumatic.  Eyes: Pupils are equal, round, and reactive to light.  Neck: Normal range of motion. Neck supple.  Cardiovascular: Normal rate and regular rhythm.   Pulmonary/Chest: Effort normal and breath sounds normal.  Abdominal: Soft. She exhibits no distension. There is no tenderness.  Neurological: She is alert and oriented to person, place, and time.       Minor teeth grinding and facial muscle asymetric movements consistent with TD.  No arm drift  Skin: Skin is warm and dry.    Psychiatric: Her mood appears not anxious. Her affect is blunt. Her speech is not delayed and not slurred. She does not exhibit a depressed mood. She expresses no homicidal and no suicidal ideation. She expresses no suicidal plans. She exhibits abnormal recent memory. She exhibits normal remote memory.    ED Course  Procedures (including critical care time)  Labs Reviewed  CBC - Abnormal; Notable for the following:    Hemoglobin 15.2 (*)    All other components within normal limits  BASIC METABOLIC PANEL - Abnormal; Notable for the following:    Sodium 134 (*)    Potassium 3.3 (*)    Chloride 95 (*)    Glucose, Bld 115 (*)    BUN 31 (*)    Creatinine, Ser 1.83 (*)    GFR calc non Af Amer 29 (*)    GFR calc Af Amer 34 (*)    All other components within normal limits  URINE RAPID DRUG SCREEN (HOSP PERFORMED) - Abnormal; Notable for the following:    Cocaine POSITIVE (*)    Benzodiazepines POSITIVE (*)    All other components within normal limits  URINALYSIS, ROUTINE W REFLEX MICROSCOPIC - Abnormal; Notable for the following:    APPearance HAZY (*)    Bilirubin Urine MODERATE (*)    Ketones, ur 15 (*)    Leukocytes, UA SMALL (*)    All other components within normal limits  URINE MICROSCOPIC-ADD ON - Abnormal; Notable for the following:    Squamous Epithelial / LPF FEW (*)    Bacteria, UA FEW (*)    Casts GRANULAR CAST (*)    All other components within normal limits  ETHANOL   No results found.   1. Bipolar disorder   2. Polysubstance abuse       MDM  Labs pending.  Otherwise I think medically clear.  ACT consulted to evaluate and provide assistance, possibly for inpt stabilization as well as social and substance abuse support. VS are stable.         Gavin Pound. Oletta Lamas, MD 01/21/12 2013

## 2012-01-21 NOTE — BH Assessment (Addendum)
Assessment Note PT denies SI/HI, but is concerned about placement.  She reports she has been trying to get her payee changed and get into an assisted living facility.  She is connected with the payee/nurse at the [facility] and familiar with their housing counseling. CLinician spoke with Ms Danielle [name]'s daughter, Danielle [name], who is willing to take the patient home with her this evening and keep her at her home until she is able to follow up with her referrals on Monday. Ms Danielle [name] signed a no harm contract and was given information for CDIOP at [facility], homeless shelters, SA programs, and local therapists and counselors. Dr Radford Pax, EDP, is in agreement with the disposition.  Danielle [name] is an 61 y.o. female seeking detox from crack.  Pt reports she has a history of mental illness, reportedly schizophrenia, but presently denies any SI, HI, or psychosis.  She is abusing crack and denies any other substance use.  Pt was originally referred to [facility], but there is a 2 week wait.  [facility] is not accepting any more referrals for weekend admissions to their treatment program.  Pt is agreeable to beginning CDIOP at [facility] and will follow up next week to obtain a start date.  She was also given information to follow up with treatment programs on her own and given indigent resource information.    Axis I: Substance Abuse and Substance Induced Mood Disorder Axis II: Deferred Axis III:  Past Medical History  Diagnosis Date  . Hypertension   . Asthma   . Depression   . Bipolar affective disorder, currently depressed, mild   . Stroke   . Coronary artery disease    Axis IV: economic problems and housing problems Axis V: 51-60 moderate symptoms  Past Medical History:  Past Medical History  Diagnosis Date  . Hypertension   . Asthma   . Depression   . Bipolar affective disorder, currently depressed, mild   . Stroke   . Coronary artery disease     Past Surgical History  Procedure Date  . Abdominal hysterectomy     . Cardiac catheterization     Family History: History reviewed. No pertinent family history.  Social History:  reports that she has quit smoking. She does not have any smokeless tobacco history on file. She reports that she drinks alcohol. She reports that she uses illicit drugs (Cocaine).  Additional Social History:    Allergies:  Allergies  Allergen Reactions  . Doxycycline     swelling  . Fosinopril Sodium Swelling     swelling face,lips-angioedema from ACE I  . Penicillins     swelling  . Sulfonamide Derivatives     unknown    Home Medications:  Medications Prior to Admission  Medication Dose Route Frequency Provider Last Rate Last Dose  . cloNIDine (CATAPRES) tablet 0.2 mg  0.2 mg Oral Daily Danielle Pound. Ghim, MD   0.2 mg at 01/21/12 1629  . LORazepam (ATIVAN) tablet 1 mg  1 mg Oral Q8H PRN Danielle Pound. Ghim, MD   1 mg at 01/21/12 0757  . LORazepam (ATIVAN) tablet 2 mg  2 mg Oral Once Danielle Pound. Ghim, MD   2 mg at 01/21/12 1510  . ondansetron (ZOFRAN) tablet 4 mg  4 mg Oral Q8H PRN Danielle Pound. Ghim, MD   4 mg at 01/21/12 1503  . ondansetron (ZOFRAN-ODT) disintegrating tablet 4 mg  4 mg Oral Once Hurman Horn, MD   4 mg at 01/21/12 0856  . triamterene-hydrochlorothiazide (MAXZIDE) 75-50  MG per tablet 1 tablet  1 tablet Oral Daily Danielle Pound. Ghim, MD   1 tablet at 01/21/12 1617   Medications Prior to Admission  Medication Sig Dispense Refill  . ALPRAZolam (XANAX) 0.5 MG tablet Take 0.5 mg by mouth 4 (four) times daily.      . ClonazePAM (KLONOPIN PO) Take 1 tablet by mouth 2 (two) times daily.      . cloNIDine (CATAPRES) 0.2 MG tablet Take 0.2 mg by mouth 2 (two) times daily.      Marland Kitchen triamterene-hydrochlorothiazide (MAXZIDE) 75-50 MG per tablet Take 1 tablet by mouth daily.        OB/GYN Status:  No LMP recorded. Patient has had a hysterectomy.  General Assessment Data Location of Assessment: [facility] ED Living Arrangements: Non-relatives/Friends Can pt return to current living  arrangement?: Yes Admission Status: Voluntary Is patient capable of signing voluntary admission?: Yes Transfer from: Acute Hospital Referral Source: MD  Education Status Is patient currently in school?: No  Risk to self Suicidal Ideation: No Suicidal Intent: No Is patient at risk for suicide?: No Suicidal Plan?: No Access to Means: No What has been your use of drugs/alcohol within the last 12 months?: daily Previous Attempts/Gestures: No How many times?: 0  Other Self Harm Risks: none Triggers for Past Attempts: Unknown Intentional Self Injurious Behavior: None Family Suicide History: Unable to assess Recent stressful life event(s): Conflict (Comment) Persecutory voices/beliefs?: No Depression: Yes Depression Symptoms: Tearfulness;Feeling worthless/self pity Substance abuse history and/or treatment for substance abuse?: Yes Suicide prevention information given to non-admitted patients: Not applicable  Risk to Others Homicidal Ideation: No Thoughts of Harm to Others: No Current Homicidal Intent: No Current Homicidal Plan: No Access to Homicidal Means: No Identified Victim: denies History of harm to others?: No Assessment of Violence: None Noted Violent Behavior Description: denies Does patient have access to weapons?: Yes (Comment) Criminal Charges Pending?: No Does patient have a court date: No  Psychosis Hallucinations: None noted Delusions: None noted  Mental Status Report Appear/Hygiene: Disheveled Eye Contact: Poor Motor Activity: Unable to assess Speech: Slurred;Incoherent Level of Consciousness: Drowsy Mood: Despair Affect: Fearful Anxiety Level: Minimal Thought Processes: Irrelevant;Circumstantial Judgement: Impaired Orientation: Person;Place;Time Obsessive Compulsive Thoughts/Behaviors: None  Cognitive Functioning Concentration: Normal Memory: Recent Impaired IQ: Average Insight: Poor Impulse Control: Poor Appetite: Poor Weight Loss: 0    Weight Gain: 0  Sleep: No Change Total Hours of Sleep: 0  Vegetative Symptoms: None  Prior Inpatient Therapy Prior Inpatient Therapy: Yes Prior Therapy Dates: 2013 Prior [facility] Facilty[facility]Provider(s): [facility] Reason for Treatment: MH/SA  Prior Outpatient Therapy Prior Outpatient Therapy: Yes Prior Therapy Dates: 2013 Prior Therapy Facilty/Provider(s): [facility] Reason for Treatment: MH/SA                     Additional Information 1:1 In Past 12 Months?: No CIRT Risk: No Elopement Risk: No Does patient have medical clearance?: Yes     Disposition:  Disposition Disposition of Patient: Outpatient treatment;Referred to Type of inpatient treatment program: Adult Type of outpatient treatment: [facility] - Intensive Outpatient (PT abusing crack-does not require inpatient detox) Patient referred to: Other (Comment);Outpatient clinic referral [facility], Info for other CDIOP and treatment programs)  On Site Evaluation by:   Reviewed with Physician:     Steward Ros 01/21/2012 7:06 PM

## 2012-01-21 NOTE — ED Notes (Signed)
The pt is sleeping 

## 2012-01-21 NOTE — ED Notes (Signed)
Patient sleeping with NAD at this time. 

## 2012-01-21 NOTE — ED Notes (Signed)
Act team person has called the daughter to get this pt an take her home

## 2012-01-21 NOTE — ED Notes (Signed)
Pt to ed requesting detox. States she is using crack and etoh daily. Pt states she is homeless. States last use of etoh or crack was 2 days ago. Pt denies feeling SI/HI

## 2012-01-21 NOTE — ED Notes (Signed)
Pt wants NO visitors.  Pt resting states she is starting to have slight nausea.

## 2012-01-21 NOTE — ED Notes (Signed)
Pt requested lights to be turned out because she wants to try to rest.

## 2012-01-21 NOTE — ED Notes (Signed)
Pt eating dinner.   She says she wants to go to assisted living whenever she leaves here because she is tired of living on the street.

## 2012-01-21 NOTE — BH Assessment (Signed)
Spoke with Karren Burly at [facility] who reports that he has no knowledge of the pt's referral to their program because the fax machine is in another office, but stated he was sure that it was received and accepted to the wait list if we hadn't heard anything to the contrary, however, he reports that the wait list is currently two weeks long.  Clinician will explore alternate placement.

## 2012-01-21 NOTE — BH Assessment (Signed)
Assessment Note   Danielle [name] is an 61 y.o. female. Pt presents with symptoms of paranoia and substance abuse.  Pt. Has been on crack cocaine binge for almost two weeks.  Pt reports that she hx of drug use and recently relapsed when she and her daughter who is POA had a falling out. Pt. Reports she is homeless.  Staff contacted Daughter and learned that pt's mh has been deterioating and she had scheduled appointments with her psychiatrist, but she left home two weeks ago after being confrontational to daughter and was living in the streets using drugs.  Pt. Reports that she has a psychiatrist at [facility] but has not seen him in at least 30 days and has not taken any medications in the past 30 days or more.  Pt reports she has dx of bipolar and schizophrenia. Pt reported thoughts of SI but  No plan.  Pt also has serious medical conditions according to daughter.    Axis I: Psychotic Disorder NOS and Substance Induced Mood Disorder Axis II:  Deferred Axis III:  Hypertention, asthma, stroke, coronary artery disease Axis IV:  Problems with primary support group and environmental factors Axis V:  11-20  Past Medical History:  Past Medical History  Diagnosis Date  . Hypertension   . Asthma   . Depression   . Bipolar affective disorder, currently depressed, mild   . Stroke   . Coronary artery disease     Past Surgical History  Procedure Date  . Abdominal hysterectomy   . Cardiac catheterization     Family History: History reviewed. No pertinent family history.  Social History:  reports that she has quit smoking. She does not have any smokeless tobacco history on file. She reports that she drinks alcohol. She reports that she uses illicit drugs (Cocaine).  Additional Social History:    Allergies:  Allergies  Allergen Reactions  . Doxycycline     swelling  . Fosinopril Sodium Swelling     swelling face,lips-angioedema from ACE I  . Penicillins     swelling  . Sulfonamide  Derivatives     unknown    Home Medications:  Medications Prior to Admission  Medication Dose Route Frequency Provider Last Rate Last Dose  . LORazepam (ATIVAN) tablet 1 mg  1 mg Oral Q8H PRN Gavin Pound. Ghim, MD   1 mg at 01/21/12 0757  . ondansetron (ZOFRAN) tablet 4 mg  4 mg Oral Q8H PRN Gavin Pound. Ghim, MD      . ondansetron (ZOFRAN-ODT) disintegrating tablet 4 mg  4 mg Oral Once Hurman Horn, MD   4 mg at 01/21/12 4098   Medications Prior to Admission  Medication Sig Dispense Refill  . ALPRAZolam (XANAX) 0.5 MG tablet Take 0.5 mg by mouth 4 (four) times daily.      . ClonazePAM (KLONOPIN PO) Take 1 tablet by mouth 2 (two) times daily.      . cloNIDine (CATAPRES) 0.2 MG tablet Take 0.2 mg by mouth 2 (two) times daily.      Marland Kitchen triamterene-hydrochlorothiazide (MAXZIDE) 75-50 MG per tablet Take 1 tablet by mouth daily.        OB/GYN Status:  No LMP recorded. Patient has had a hysterectomy.  General Assessment Data Location of Assessment: [facility] ED Living Arrangements: Non-relatives/Friends Can pt return to current living arrangement?: Yes Admission Status: Voluntary Is patient capable of signing voluntary admission?: Yes Transfer from: Acute Hospital Referral Source: MD  Education Status Is patient currently in school?: No  Risk to self Suicidal Ideation: No Suicidal Intent: No Is patient at risk for suicide?: No Suicidal Plan?: No Access to Means: No What has been your use of drugs/alcohol within the last 12 months?: daily Previous Attempts/Gestures: No How many times?: 0  Other Self Harm Risks: none Triggers for Past Attempts: Unknown Intentional Self Injurious Behavior: None Family Suicide History: Unable to assess Recent stressful life event(s): Conflict (Comment) Persecutory voices/beliefs?: No Depression: Yes Depression Symptoms: Tearfulness;Feeling worthless/self pity Substance abuse history and/or treatment for substance abuse?: Yes Suicide prevention  information given to non-admitted patients: Not applicable  Risk to Others Homicidal Ideation: No Thoughts of Harm to Others: No Current Homicidal Intent: No Current Homicidal Plan: No Access to Homicidal Means: No Identified Victim: denies History of harm to others?: No Assessment of Violence: None Noted Violent Behavior Description: denies Does patient have access to weapons?: Yes (Comment) Criminal Charges Pending?: No Does patient have a court date: No  Psychosis Hallucinations: None noted Delusions: None noted  Mental Status Report Appear/Hygiene: Disheveled Eye Contact: Poor Motor Activity: Unable to assess Speech: Slurred;Incoherent Level of Consciousness: Drowsy Mood: Despair Affect: Fearful Anxiety Level: Minimal Thought Processes: Irrelevant;Circumstantial Judgement: Impaired Orientation: Person;Place;Time Obsessive Compulsive Thoughts/Behaviors: None  Cognitive Functioning Concentration: Normal Memory: Recent Impaired IQ: Average Insight: Poor Impulse Control: Poor Appetite: Poor Weight Loss: 0  Weight Gain: 0  Sleep: No Change Total Hours of Sleep: 0  Vegetative Symptoms: None  Prior Inpatient Therapy Prior Inpatient Therapy: Yes Prior Therapy Dates: 2013 [facility] Facilty[facility]Provider[facility]): [facility] Reason for Treatment: MH/SA  Prior Outpatient Therapy Prior Outpatient Therapy: Yes Prior Therapy Dates: 2013 Prior [facility] Facilty[facility]Provider(s): [facility] Reason for Treatment: MH/SA                     Additional Information 1:1 In Past 12 Months?: No CIRT Risk: No Elopement Risk: No Does patient have medical clearance?: Yes     Disposition: Pt referred to [facility]. Disposition Disposition of Patient: Inpatient treatment program Type of inpatient treatment program: Adult  On Site Evaluation by:   Reviewed with Physician:     Barbaraann Boys 01/21/2012 10:16 AM

## 2012-01-21 NOTE — ED Notes (Signed)
First meeting with patient. Patient tranfered from [facility] to [facility]. Patient is resting with NAD at this time. Patient given warm blankets and ice water per patient request. Patient remains in street clothes and has her purse with her.

## 2012-01-21 NOTE — Discharge Instructions (Signed)
Follow the instructions given by the act team worker for followup.

## 2012-01-21 NOTE — ED Notes (Signed)
Patient given graham crackers and peanut butter and apple juice. Patient resting with NAD at this time.

## 2012-02-07 NOTE — Progress Notes (Signed)
This encounter was created in error - please disregard.

## 2012-02-10 ENCOUNTER — Encounter (HOSPITAL_COMMUNITY): Payer: Self-pay | Admitting: *Deleted

## 2012-02-10 ENCOUNTER — Emergency Department (HOSPITAL_COMMUNITY)
Admission: EM | Admit: 2012-02-10 | Discharge: 2012-02-10 | Disposition: A | Discharge status: Home / Self Care | Payer: Medicare Other | Attending: Emergency Medicine | Admitting: Emergency Medicine

## 2012-02-10 DIAGNOSIS — I10 Essential (primary) hypertension: Secondary | ICD-10-CM | POA: Insufficient documentation

## 2012-02-10 DIAGNOSIS — T7840XA Allergy, unspecified, initial encounter: Secondary | ICD-10-CM | POA: Insufficient documentation

## 2012-02-10 DIAGNOSIS — R21 Rash and other nonspecific skin eruption: Secondary | ICD-10-CM | POA: Insufficient documentation

## 2012-02-10 DIAGNOSIS — J45909 Unspecified asthma, uncomplicated: Secondary | ICD-10-CM | POA: Insufficient documentation

## 2012-02-10 DIAGNOSIS — R220 Localized swelling, mass and lump, head: Secondary | ICD-10-CM | POA: Insufficient documentation

## 2012-02-10 DIAGNOSIS — Z87891 Personal history of nicotine dependence: Secondary | ICD-10-CM | POA: Insufficient documentation

## 2012-02-10 DIAGNOSIS — Z8673 Personal history of transient ischemic attack (TIA), and cerebral infarction without residual deficits: Secondary | ICD-10-CM | POA: Insufficient documentation

## 2012-02-10 DIAGNOSIS — I2510 Atherosclerotic heart disease of native coronary artery without angina pectoris: Secondary | ICD-10-CM | POA: Insufficient documentation

## 2012-02-10 DIAGNOSIS — F3131 Bipolar disorder, current episode depressed, mild: Secondary | ICD-10-CM | POA: Insufficient documentation

## 2012-02-10 MED ORDER — PREDNISONE 20 MG PO TABS
60.0000 mg | ORAL_TABLET | Freq: Once | ORAL | Status: AC
  Administered 2012-02-10: 60 mg via ORAL
  Filled 2012-02-10: qty 3

## 2012-02-10 MED ORDER — DIPHENHYDRAMINE HCL 25 MG PO CAPS
25.0000 mg | ORAL_CAPSULE | Freq: Once | ORAL | Status: AC
  Administered 2012-02-10: 25 mg via ORAL
  Filled 2012-02-10: qty 1

## 2012-02-10 MED ORDER — PREDNISONE 20 MG PO TABS: 40.0000 mg | ORAL_TABLET | Freq: Every day | ORAL | Status: AC

## 2012-02-10 NOTE — ED Provider Notes (Addendum)
History     CSN: [id number]  Arrival date & time 02/10/12  1412   First MD Initiated Contact with Patient 02/10/12 1536      Chief Complaint  Patient presents with  . Facial Swelling    (Consider location/radiation/quality/duration/timing/severity/associated sxs/prior treatment) Patient is a 61 y.o. female presenting with allergic reaction. The history is provided by the patient.  Allergic Reaction The primary symptoms are  rash and angioedema. The primary symptoms do not include wheezing, shortness of breath, cough, nausea or vomiting. The current episode started yesterday (Upper lip swelling started today). The problem has been gradually worsening. This is a new problem.  The rash began yesterday. Location: Diffuse. The rash is associated with itching. Risk factors: no New medications.  The angioedema began 3 to 5 hours ago. The angioedema has been gradually worsening since its onset. It is a new problem. It is located on the lips. The angioedema is not associated with shortness of breath or stridor.   Associated with: New dog in the house. Significant symptoms also include itching.    Past Medical History  Diagnosis Date  . Hypertension   . Asthma   . Depression   . Bipolar affective disorder, currently depressed, mild   . Stroke   . Coronary artery disease     Past Surgical History  Procedure Date  . Abdominal hysterectomy   . Cardiac catheterization     No family history on file.  History  Substance Use Topics  . Smoking status: Former Games developer  . Smokeless tobacco: Not on file  . Alcohol Use: No     denies    OB History    Grav Para Term Preterm Abortions TAB SAB Ect Mult Living                  Review of Systems  Respiratory: Negative for cough, shortness of breath, wheezing and stridor.   Gastrointestinal: Negative for nausea and vomiting.  Skin: Positive for itching and rash.  All other systems reviewed and are negative.    Allergies    Doxycycline; Fosinopril sodium; Penicillins; and Sulfonamide derivatives  Home Medications   Current Outpatient Rx  Name Route Sig Dispense Refill  . ALPRAZOLAM 0.5 MG PO TABS Oral Take 0.5 mg by mouth 4 (four) times daily.    . ASPIRIN EC 81 MG PO TBEC Oral Take 81 mg by mouth daily.    Marland Kitchen VITAMIN-B COMPLEX PO Oral Take 1 tablet by mouth daily.    Marland Kitchen KLONOPIN PO Oral Take 1 tablet by mouth 2 (two) times daily.    Marland Kitchen CLONIDINE HCL 0.2 MG PO TABS Oral Take 0.2 mg by mouth 2 (two) times daily.    Marland Kitchen PETROLEUM JELLY EX Apply externally Apply 1 application topically as needed. For itching    . TRIAMTERENE-HCTZ 75-50 MG PO TABS Oral Take 1 tablet by mouth daily.      BP 123/106  Pulse 98  Temp(Src) 98.2 F (36.8 C) (Oral)  Resp 20  Wt 135 lb (61.236 kg)  SpO2 98%  Physical Exam  Nursing note and vitals reviewed. Constitutional: She is oriented to person, place, and time. She appears well-developed and well-nourished. No distress.  HENT:  Head: Normocephalic and atraumatic.  Mouth/Throat: Oropharynx is clear and moist.         No uvula or tongue swelling  Eyes: Conjunctivae and EOM are normal. Pupils are equal, round, and reactive to light.  Neck: Normal range of motion. Neck supple.  Cardiovascular: Normal rate, regular rhythm and intact distal pulses.   No murmur heard. Pulmonary/Chest: Effort normal and breath sounds normal. No respiratory distress. She has no wheezes. She has no rales.  Abdominal: Soft. She exhibits no distension. There is no tenderness. There is no rebound and no guarding.  Musculoskeletal: Normal range of motion. She exhibits no edema and no tenderness.  Neurological: She is alert and oriented to person, place, and time.  Skin: Skin is warm and dry. No rash noted. No erythema.  Psychiatric: She has a normal mood and affect. Her behavior is normal.    ED Course  Procedures (including critical care time)  Labs Reviewed - No data to display No results  found.   1. Allergic reaction       MDM   Patient with evidence of allergic reaction today. She is swelling of the upper lip but no lung involvement. No tongue swelling or uvula edema. Patient is unclear what she came in contact with but states she has been around a dog which is new.  No high is her urticaria currently. However patient still complaining of diffuse itching. She is not on an ACE inhibitor. Patient given Benadryl and prednisone and will recheck.  5:25 PM On repeat evaluation no worsening in swelling and mild improvement. Will discharge him with steroids and Benadryl.      Gwyneth Sprout, MD 02/10/12 1725  Gwyneth Sprout, MD 02/10/12 1730

## 2012-02-10 NOTE — Discharge Instructions (Signed)

## 2012-02-10 NOTE — ED Notes (Signed)
Pt states "My mouth started swelling yesterday, if feels like something's biting me, I see little bumps come up and and then they go down"; pt presents with edema to upper lip.

## 2012-03-12 ENCOUNTER — Emergency Department (HOSPITAL_COMMUNITY)
Admission: EM | Admit: 2012-03-12 | Discharge: 2012-03-12 | Disposition: A | Discharge status: Home / Self Care | Payer: Medicare Other | Attending: Emergency Medicine | Admitting: Emergency Medicine

## 2012-03-12 ENCOUNTER — Encounter (HOSPITAL_COMMUNITY): Payer: Self-pay | Admitting: *Deleted

## 2012-03-12 ENCOUNTER — Emergency Department (HOSPITAL_COMMUNITY): Payer: Medicare Other

## 2012-03-12 DIAGNOSIS — I2510 Atherosclerotic heart disease of native coronary artery without angina pectoris: Secondary | ICD-10-CM | POA: Insufficient documentation

## 2012-03-12 DIAGNOSIS — F1910 Other psychoactive substance abuse, uncomplicated: Secondary | ICD-10-CM | POA: Insufficient documentation

## 2012-03-12 DIAGNOSIS — F419 Anxiety disorder, unspecified: Secondary | ICD-10-CM

## 2012-03-12 DIAGNOSIS — F411 Generalized anxiety disorder: Secondary | ICD-10-CM | POA: Insufficient documentation

## 2012-03-12 DIAGNOSIS — Z79899 Other long term (current) drug therapy: Secondary | ICD-10-CM | POA: Insufficient documentation

## 2012-03-12 DIAGNOSIS — R0602 Shortness of breath: Secondary | ICD-10-CM | POA: Insufficient documentation

## 2012-03-12 DIAGNOSIS — I10 Essential (primary) hypertension: Secondary | ICD-10-CM | POA: Insufficient documentation

## 2012-03-12 DIAGNOSIS — J45909 Unspecified asthma, uncomplicated: Secondary | ICD-10-CM | POA: Insufficient documentation

## 2012-03-12 DIAGNOSIS — Z8673 Personal history of transient ischemic attack (TIA), and cerebral infarction without residual deficits: Secondary | ICD-10-CM | POA: Insufficient documentation

## 2012-03-12 DIAGNOSIS — R059 Cough, unspecified: Secondary | ICD-10-CM | POA: Insufficient documentation

## 2012-03-12 DIAGNOSIS — R05 Cough: Secondary | ICD-10-CM | POA: Insufficient documentation

## 2012-03-12 DIAGNOSIS — F3130 Bipolar disorder, current episode depressed, mild or moderate severity, unspecified: Secondary | ICD-10-CM | POA: Insufficient documentation

## 2012-03-12 LAB — COMPREHENSIVE METABOLIC PANEL
ALT: 27 U/L (ref 0–35)
AST: 29 U/L (ref 0–37)
Albumin: 3.4 g/dL — ABNORMAL LOW (ref 3.5–5.2)
Alkaline Phosphatase: 61 U/L (ref 39–117)
BUN: 14 mg/dL (ref 6–23)
CO2: 27 mEq/L (ref 19–32)
Calcium: 9.3 mg/dL (ref 8.4–10.5)
Chloride: 100 mEq/L (ref 96–112)
Creatinine, Ser: 1.1 mg/dL (ref 0.50–1.10)
GFR calc Af Amer: 63 mL/min — ABNORMAL LOW (ref >90)
GFR calc non Af Amer: 54 mL/min — ABNORMAL LOW (ref >90)
Glucose, Bld: 142 mg/dL — ABNORMAL HIGH (ref 70–99)
Potassium: 2.3 mEq/L — CL (ref 3.5–5.1)
Sodium: 141 mEq/L (ref 135–145)
Total Bilirubin: 0.4 mg/dL (ref 0.3–1.2)
Total Protein: 7.1 g/dL (ref 6.0–8.3)

## 2012-03-12 LAB — CBC
HCT: 36.7 % (ref 36.0–46.0)
Hemoglobin: 12.5 g/dL (ref 12.0–15.0)
MCH: 30.7 pg (ref 26.0–34.0)
MCHC: 34.1 g/dL (ref 30.0–36.0)
MCV: 90.2 fL (ref 78.0–100.0)
Platelets: 287 10*3/uL (ref 150–400)
RBC: 4.07 MIL/uL (ref 3.87–5.11)
RDW: 14.7 % (ref 11.5–15.5)
WBC: 6.7 10*3/uL (ref 4.0–10.5)

## 2012-03-12 LAB — TROPONIN I: Troponin I: <0.3 ng/mL (ref <0.30)

## 2012-03-12 LAB — RAPID URINE DRUG SCREEN, HOSP PERFORMED
Amphetamines: NOT DETECTED
Barbiturates: NOT DETECTED
Benzodiazepines: POSITIVE — AB
Cocaine: POSITIVE — AB
Opiates: POSITIVE — AB
Tetrahydrocannabinol: NOT DETECTED

## 2012-03-12 LAB — POTASSIUM
Potassium: 3 mEq/L — ABNORMAL LOW (ref 3.5–5.1)
Potassium: 3.4 mEq/L — ABNORMAL LOW (ref 3.5–5.1)

## 2012-03-12 LAB — MAGNESIUM: Magnesium: 1.8 mg/dL (ref 1.5–2.5)

## 2012-03-12 LAB — ETHANOL: Alcohol, Ethyl (B): <11 mg/dL (ref 0–11)

## 2012-03-12 MED ORDER — POTASSIUM CHLORIDE CRYS ER 20 MEQ PO TBCR
40.0000 meq | EXTENDED_RELEASE_TABLET | Freq: Once | ORAL | Status: AC
  Administered 2012-03-12: 40 meq via ORAL
  Filled 2012-03-12: qty 2

## 2012-03-12 MED ORDER — SODIUM CHLORIDE 0.9 % IV SOLN
Freq: Once | INTRAVENOUS | Status: AC
  Administered 2012-03-12: 10:00:00 via INTRAVENOUS

## 2012-03-12 MED ORDER — POTASSIUM CHLORIDE 10 MEQ/100ML IV SOLN
10.0000 meq | INTRAVENOUS | Status: AC
  Administered 2012-03-12 (×3): 10 meq via INTRAVENOUS
  Filled 2012-03-12: qty 200
  Filled 2012-03-12: qty 100

## 2012-03-12 MED ORDER — LORAZEPAM 1 MG PO TABS
1.0000 mg | ORAL_TABLET | Freq: Once | ORAL | Status: AC
Start: 2012-03-12 — End: 2012-03-12
  Administered 2012-03-12: 1 mg via ORAL
  Filled 2012-03-12: qty 1

## 2012-03-12 MED ORDER — POTASSIUM CHLORIDE CRYS ER 20 MEQ PO TBCR: EXTENDED_RELEASE_TABLET | ORAL | Status: DC

## 2012-03-12 NOTE — ED Notes (Signed)
Christian, ACT Team at bedside. Referrals for outpatient substance abuse given

## 2012-03-12 NOTE — ED Provider Notes (Signed)
Psychiatry evaluated patient felt like she could go home with referral for opioid abuse.  This was done as per the Act team.  A repeat potassium was done and returned at a level of 3.4.  Patient was discharged on potassium and instructed on proper followup.  Nelia Shi, MD 03/12/12 [phone]

## 2012-03-12 NOTE — Discharge Instructions (Signed)
Your potassium level is low (3) - eat plenty of fruits and vegetables, take potassium supplement as prescribed, and follow up with primary care doctor in 2-3 days for recheck.  Do not use cocaine - cocaine use can cause heart attacks, strokes, and sudden death. For substance abuse and mental health follow up with [facility] in the next couple days, and use resource guide provided. Return to ER if worse, new symptoms, persistent or recurrent chest pain, trouble breathing, severe depression, other concern.           RESOURCE GUIDE  Dental Problems  Patients with Medicaid: [facility] [phone] [address]                                           [phone] [address] Phone:  [phone]                                                  Phone:  [phone]  If unable to pay or uninsured, contact:  [facility] or [facility] to become qualified for the adult dental clinic.  Chronic Pain Problems Contact [facility]  [phone] Patients need to be referred by their primary care doctor.  Insufficient Money for Medicine Contact [employer]:  call "211" or [facility] [phone].  No Primary Care Doctor Call [facility]  3033509303 Other agencies that provide inexpensive medical care    [facility]  [phone]    [facility]  [phone]    [facility]  [phone]    [facility]  [phone]    [facility]  [phone]    [facility]  [phone]  Psychological Services [facility]  [phone] [facility]  [phone] [facility] Mental [facility]   [phone] (emergency services [phone])  Substance Abuse [facility]  [phone] [facility] [phone] [facility] [phone] Floydene Flock [phone] Residential & Outpatient Substance Abuse Program   [phone]  Abuse/Neglect [facility] Child Abuse Hotline 581-455-4889 [facility] Child Abuse Hotline [phone] (After Hours)  Emergency Shelter [facility] [phone]  [facility] [phone] [facility] [phone]  MRSA Hotline #:   (587)611-3671    [facility] Resources  [facility]     [employer]                          [facility] Dept. [address] [place]                       [address]      [address]  Patrecia Pace  Michell Heinrich Phone:  [phone]                                   Phone:  [phone]                 Phone:  [phone]  [facility] Mental Health Phone:  [phone]  [facility] Child Abuse Hotline 7134884163 [phone] (After Hours)       Manic Depression (Bipolar Disorder) Bipolar disorder is also known as manic depressive illness. It is when the brain does not function properly and causes shifts in a person's moods, energy and ability to function in everyday life. These shifts are different from the normal ups and downs that everyone experiences. Instead the shifts are severe. If this goes untreated, the person's life becomes more and more disorderly. People with this disorder can be treated can lead full and productive lives. This disorder must be managed throughout life.  SYMPTOMS   Bipolar disorder causes dramatic mood swings. These mood swings go in cycles. They cycle from extreme "highs" and irritable to deep "lows" of sadness and hopelessness.   Between the extreme moods, there are usually periods of normal mood.   Along with the mood shifts, the person will have severe changes in energy and behavior. The periods of "highs" and "lows" are called episodes of mania and depression.  Signs of mania:  Lots of  energy, activity and restlessness.   Extreme "high" or good mood.   Extreme irritability.   Racing thoughts and talking very fast.   Jumping from one idea to another.   Not able to focus, easily distracted.   Little need to sleep.   Grand beliefs in one's abilities and powers.   Spending sprees.   Increased sexual drive. This can result in many sexual partners.   Poor judgment.   Abuse of drugs, particularly cocaine, alcohol, and sleeping medication.   Aggressive or provocative behavior.   A lasting period of behavior that is different from usual.   Denial that anything is wrong.  *A manic episode is identified if a "high" mood happens with three or more of the other symptoms lasting most of the day, nearly everyday for a week or longer. If the mood is more irritable in nature, four additional symptoms must be present. Signs of depression:  Lasting feelings of sadness, anxiety, or empty mood.   Feelings of hopelessness with negative thoughts.   Feelings of guilt, worthlessness, or helplessness.   Loss of interest or pleasure in activities once enjoyed, including sex.   Feelings of fatigue or having less energy.   Trouble focusing, making decisions, remembering.   Feeling restless or irritable.   Sleeping too little or too much.   Change in eating with possible weight gain or loss.   Feeling ongoing pain that is not caused by physical illness or injury.   Thoughts of death or suicide or suicide attempts.  *A depressive episode is identified as having five or more of the above symptoms that last most of the day, nearly everyday for two weeks or longer. CAUSES   Research shows that there is no single cause for the disorder. Many factors act together to produce the illness.   This can be passed down from family (hereditary).   Environment may play a part.  TREATMENT   Long-term treatment is strongly recommended because bipolar disorder is a repeated illness.  This  disorder is better controlled if treatment is ongoing than if it is off and on.   A combination of medication and talk therapy is best for managing the disorder over time.   Medication.   Medication can be prescribed by a doctor that is an expert in treating mental disorders (psychiatrists). Medications known as "mood stabilizers" are usually prescribed to help control the illness. Other medications can be added when needed. These medicines usually treat episodes of mania or depression that break through despite the mood stabilizer.   Talk Therapy.   Along with medication, some forms of talk therapy are helpful in providing support, education and guidance to people with the illness and their families. Studies show that this type of treatment increases mood stability, decreases need for hospitalization and improves how they function society.   Electroconvulsive Therapy (ECT).   In extreme situations where the above treatments do not work or work too slowly to relieve severe symptoms, ECT may be considered.  Document Released: 01/10/2001 Document Revised: 09/23/2011 Document Reviewed: 09/01/2007 [facility] Patient Information 2012 [place], [place].      Cocaine Abuse and Chemical Dependency WHEN IS DRUG USE A PROBLEM? Anytime drug use is interfering with normal living activities it has become abuse. This includes problems with family and friends. Psychological dependence has developed when your mind tells you that the drug is needed. This is usually followed by physical dependence which has developed when continuing increases of drug are required to get the same feeling or "high". This is known as addiction or chemical dependency. A person's risk is much higher if there is a history of chemical dependency in the family. SIGNS OF CHEMICAL DEPENDENCY:  Been told by friends or family that drugs have become a problem.   Fighting when using drugs.   Having blackouts (not remembering what  you do while using).   Feel sick from using drugs but continue using.   Lie about use or amounts of drugs (chemicals) used.   Need chemicals to get you going.   Suffer in work International aid/development worker or school because of drug use.   Get sick from use of drugs but continue to use anyway.   Need drugs to relate to people or feel comfortable in social situations.   Use drugs to forget problems.  Yes answered to any of the above signs of chemical dependency indicates there are problems. The longer the use of drugs continues, the greater the problems will become. If there is a family history of drug or alcohol use it is best not to experiment with these drugs. Experimentation leads to tolerance and needing to use more of the drug to get the same feeling. This is followed by addiction where drugs become the most important part of life. It becomes more important to take drugs than participate in the other usual activities of life including relating to friends and family. Addiction is followed by dependency where drugs are now needed not just to get high but to feel normal. Addiction cannot be cured but it can be stopped. This often requires outside help and the care of professionals. Treatment centers are listed in the yellow pages under: Cocaine, Narcotics, and Alcoholics Anonymous. Most hospitals and clinics can refer you to a specialized care center. WHAT IS COCAINE? Cocaine is a strong nervous system stimulant which speeds up the body and gives the user the feeling that they have increased energy, loss of appetite and feelings of great pleasure. This "high" which begins within several minutes  and lasts for less than an hour is followed by a "crash". The crash and depressed feelings that come with it cause a craving for the drug to regain the high. HOW IS COCANINE USED? Cocaine is snorted, injected, and smoked as free- base or crack. Because smoking the drug produces a greater high it is also associated with a  greater low. It is therefore more rapidly addicting. WHAT ARE THE EFFECTS OF COCAINE? It is an anesthetic (pain killer) and a stimulant (it causes a high which gives a false feeling of well being). It increases heart and breathing rates with increases in body temperature and blood pressure. It removes appetite. It causes seizures (convulsions) along with nausea (feeling sick to your stomach), vomiting and stomach pain. This dangerous combination can lead to death. Trying to keep the high feeling leads to greater and greater drug use and this leads to addiction. Addiction can only be helped by stopping use of all chemicals. This is hard but may save your life. If the addiction is continued, the only possible outcome is loss of self respect and self esteem, violence, death, and eventually prison if the addict is fortunate enough to be caught and able to receive help prior to this last life ending event. OTHER HEALTH RISKS OF COCAINE AND ALL DRUG USE ARE:  The increased possibility of getting AIDS or hepatitis (liver inflammation).   Having a baby born which is addicted to cocaine and must go through painful withdrawal including shaking, jerking, and crying in pain. Many of the babies die. Other babies go through life with lifelong disabilities and learning problems.  HOW TO STAY DRUG FREE ONCE YOU HAVE QUIT USING:  Develop healthy activities and form friends who do not use drugs.   Stay away from the drug scene.   Tell the pusher or former friend you have other better things to do.   Have ready excuses available about why you cannot use.  For more help or information contact your local physician, clinic, hospital or dial 1-800-cocaine (769)[phone]). Document Released: 10/01/2000 Document Revised: 09/23/2011 Document Reviewed: 05/22/2008 [facility] Patient Information 2012 [place], [place].     Hypokalemia Hypokalemia means a low potassium level in the blood.Potassium is an electrolyte that  helps regulate the amount of fluid in the body. It also stimulates muscle contraction and maintains a stable acid-base balance.Most of the body's potassium is inside of cells, and only a very small amount is in the blood. Because the amount in the blood is so small, minor changes can have big effects. PREPARATION FOR TEST Testing for potassium requires taking a blood sample taken by needle from a vein in the arm. The skin is cleaned thoroughly before the sample is drawn. There is no other special preparation needed. NORMAL VALUES Potassium levels below 3.5 mEq/L are abnormally low. Levels above 5.1 mEq/L are abnormally high. Ranges for normal findings may vary among different laboratories and hospitals. You should always check with your doctor after having lab work or other tests done to discuss the meaning of your test results and whether your values are considered within normal limits. MEANING OF TEST  Your caregiver will go over the test results with you and discuss the importance and meaning of your results, as well as treatment options and the need for additional tests, if necessary. A potassium level is frequently part of a routine medical exam. It is usually included as part of a whole "panel" of tests for several blood salts (such as Sodium  and Chloride). It may be done as part of follow-up when a low potassium level was found in the past or other blood salts are suspected of being out of balance. A low potassium level might be suspected if you have one or more of the following:  Symptoms of weakness.   Abnormal heart rhythms.   High blood pressure and are taking medication to control this, especially water pills (diuretics).   Kidney disease that can affect your potassium level .   Diabetes requiring the use of insulin. The potassium may fall after taking insulin, especially if the diabetes had been out of control for a while.   A condition requiring the use of cortisone-type  medication or certain types of antibiotics.   Vomiting and/or diarrhea for more than a day or two.   A stomach or intestinal condition that may not permit appropriate absorption of potassium.   Fainting episodes.   Mental confusion.  OBTAINING TEST RESULTS It is your responsibility to obtain your test results. Ask the lab or department performing the test when and how you will get your results.  Please contact your caregiver directly if you have not received the results within one week. At that time, ask if there is anything different or new you should be doing in relation to the results. TREATMENT Hypokalemia can be treated with potassium supplements taken by mouth and/or adjustments in your current medications. A diet high in potassium is also helpful. Foods with high potassium content are:  Peas, lentils, lima beans, nuts, and dried fruit.   Whole grain and bran cereals and breads.   Fresh fruit, vegetables (bananas, cantaloupe, grapefruit, oranges, tomatoes, honeydew melons, potatoes).   Orange and tomato juices.   Meats. If potassium supplement has been prescribed for you today or your medications have been adjusted, see your personal caregiver in time02 for a re-check.  SEEK MEDICAL CARE IF:  There is a feeling of worsening weakness.   You experience repeated chest palpitations.   You are diabetic and having difficulty keeping your blood sugars in the normal range.   You are experiencing vomiting and/or diarrhea.   You are having difficulty with any of your regular medications.  SEEK IMMEDIATE MEDICAL CARE IF:  You experience chest pain, shortness of breath, or episodes of dizziness.   You have been having vomiting or diarrhea for more than 2 days.   You have a fainting episode.  MAKE SURE YOU:   Understand these instructions.   Will watch your condition.   Will get help right away if you are not doing well or get worse.  Document Released: 10/04/2005 Document  Revised: 09/23/2011 Document Reviewed: 09/14/2008 [facility] Patient Information 2012 [place], [place].     Chest Pain (Nonspecific) It is often hard to give a specific diagnosis for the cause of chest pain. There is always a chance that your pain could be related to something serious, such as a heart attack or a blood clot in the lungs. You need to follow up with your caregiver for further evaluation. CAUSES   Heartburn.   Pneumonia or bronchitis.   Anxiety or stress.   Inflammation around your heart (pericarditis) or lung (pleuritis or pleurisy).   A blood clot in the lung.   A collapsed lung (pneumothorax). It can develop suddenly on its own (spontaneous pneumothorax) or from injury (trauma) to the chest.   Shingles infection (herpes zoster virus).  The chest wall is composed of bones, muscles, and cartilage. Any of these  can be the source of the pain.  The bones can be bruised by injury.   The muscles or cartilage can be strained by coughing or overwork.   The cartilage can be affected by inflammation and become sore (costochondritis).  DIAGNOSIS  Lab tests or other studies, such as X-rays, electrocardiography, stress testing, or cardiac imaging, may be needed to find the cause of your pain.  TREATMENT   Treatment depends on what may be causing your chest pain. Treatment may include:   Acid blockers for heartburn.   Anti-inflammatory medicine.   Pain medicine for inflammatory conditions.   Antibiotics if an infection is present.   You may be advised to change lifestyle habits. This includes stopping smoking and avoiding alcohol, caffeine, and chocolate.   You may be advised to keep your head raised (elevated) when sleeping. This reduces the chance of acid going backward from your stomach into your esophagus.   Most of the time, nonspecific chest pain will improve within 2 to 3 days with rest and mild pain medicine.  HOME CARE INSTRUCTIONS   If antibiotics were  prescribed, take your antibiotics as directed. Finish them even if you start to feel better.   For the next few days, avoid physical activities that bring on chest pain. Continue physical activities as directed.   Do not smoke.   Avoid drinking alcohol.   Only take over-the-counter or prescription medicine for pain, discomfort, or fever as directed by your caregiver.   Follow your caregiver's suggestions for further testing if your chest pain does not go away.   Keep any follow-up appointments you made. If you do not go to an appointment, you could develop lasting (chronic) problems with pain. If there is any problem keeping an appointment, you must call to reschedule.  SEEK MEDICAL CARE IF:   You think you are having problems from the medicine you are taking. Read your medicine instructions carefully.   Your chest pain does not go away, even after treatment.   You develop a rash with blisters on your chest.  SEEK IMMEDIATE MEDICAL CARE IF:   You have increased chest pain or pain that spreads to your arm, neck, jaw, back, or abdomen.   You develop shortness of breath, an increasing cough, or you are coughing up blood.   You have severe back or abdominal pain, feel nauseous, or vomit.   You develop severe weakness, fainting, or chills.   You have a fever.  THIS IS AN EMERGENCY. Do not wait to see if the pain will go away. Get medical help at once. Call your local emergency services (911 in [place] Do not drive yourself to the hospital. MAKE SURE YOU:   Understand these instructions.   Will watch your condition.   Will get help right away if you are not doing well or get worse.  Document Released: 07/14/2005 Document Revised: 09/23/2011 Document Reviewed: 05/09/2008 [facility] Patient Information 2012 [place], [place].     Anxiety and Panic Attacks Your caregiver has informed you that you are having an anxiety or panic attack. There may be many forms of this. Most of the time  these attacks come suddenly and without warning. They come at any time of day, including periods of sleep, and at any time of life. They may be strong and unexplained. Although panic attacks are very scary, they are physically harmless. Sometimes the cause of your anxiety is not known. Anxiety is a protective mechanism of the body in its fight or  flight mechanism. Most of these perceived danger situations are actually nonphysical situations (such as anxiety over losing a job). CAUSES  The causes of an anxiety or panic attack are many. Panic attacks may occur in otherwise healthy people given a certain set of circumstances. There may be a genetic cause for panic attacks. Some medications may also have anxiety as a side effect. SYMPTOMS  Some of the most common feelings are:  Intense terror.   Dizziness, feeling faint.   Hot and cold flashes.   Fear of going crazy.   Feelings that nothing is real.   Sweating.   Shaking.   Chest pain or a fast heartbeat (palpitations).   Smothering, choking sensations.   Feelings of impending doom and that death is near.   Tingling of extremities, this may be from over-breathing.   Altered reality (derealization).   Being detached from yourself (depersonalization).  Several symptoms can be present to make up anxiety or panic attacks. DIAGNOSIS  The evaluation by your caregiver will depend on the type of symptoms you are experiencing. The diagnosis of anxiety or panic attack is made when no physical illness can be determined to be a cause of the symptoms. TREATMENT  Treatment to prevent anxiety and panic attacks may include:  Avoidance of circumstances that cause anxiety.   Reassurance and relaxation.   Regular exercise.   Relaxation therapies, such as yoga.   Psychotherapy with a psychiatrist or therapist.   Avoidance of caffeine, alcohol and illegal drugs.   Prescribed medication.  SEEK IMMEDIATE MEDICAL CARE IF:   You experience  panic attack symptoms that are different than your usual symptoms.   You have any worsening or concerning symptoms.  Document Released: 10/04/2005 Document Revised: 09/23/2011 Document Reviewed: 02/05/2010 [facility] Patient Information 2012 [place], [place].

## 2012-03-12 NOTE — ED Provider Notes (Addendum)
History     CSN: [id number]  Arrival date & time 03/12/12  4098   First MD Initiated Contact with Patient 03/12/12 0818      Chief Complaint  Patient presents with  . Shortness of Breath  . Medical Clearance    (Consider location/radiation/quality/duration/timing/severity/associated sxs/prior treatment) Patient is a 61 y.o. female presenting with shortness of breath. The history is provided by the patient.  Shortness of Breath  Associated symptoms include cough and shortness of breath. Pertinent negatives include no chest pain and no fever.  pt c/o non productive cough, sob w coughing spells, and states is feeling very stress and anxious. States also feels depressed at times, denies thoughts of harm to self or others. Hx bipolar disorder. States is feeling stressed as family with whom she stays went to beach this weekend. States has been without her meds for 2 days and has been using drugs, including cocaine as of yesterday. No exertional cp or discomfort no unusual fatigue. No diaphoresis or nv. No leg pain or swelling. No pleuritic cp, no dvt or pe hx. Denies orthopnea or pnd. ?hx asthma, states occasionally uses inhaler. Former smoker.   Past Medical History  Diagnosis Date  . Hypertension   . Asthma   . Depression   . Bipolar affective disorder, currently depressed, mild   . Stroke   . Coronary artery disease     Past Surgical History  Procedure Date  . Abdominal hysterectomy   . Cardiac catheterization     History reviewed. No pertinent family history.  History  Substance Use Topics  . Smoking status: Former Games developer  . Smokeless tobacco: Not on file  . Alcohol Use: No     denies    OB History    Grav Para Term Preterm Abortions TAB SAB Ect Mult Living                  Review of Systems  Constitutional: Negative for fever and chills.  HENT: Negative for neck pain.   Eyes: Negative for redness.  Respiratory: Positive for cough and shortness of breath.     Cardiovascular: Negative for chest pain and leg swelling.  Gastrointestinal: Negative for vomiting, abdominal pain and diarrhea.  Genitourinary: Negative for flank pain.  Musculoskeletal: Negative for back pain.  Skin: Negative for rash.  Neurological: Negative for weakness, numbness and headaches.  Hematological: Does not bruise/bleed easily.  Psychiatric/Behavioral: Positive for dysphoric mood.    Allergies  Doxycycline; Fosinopril sodium; Penicillins; and Sulfonamide derivatives  Home Medications   Current Outpatient Rx  Name Route Sig Dispense Refill  . ALPRAZOLAM 0.5 MG PO TABS Oral Take 0.5 mg by mouth 4 (four) times daily.    . ASPIRIN EC 81 MG PO TBEC Oral Take 81 mg by mouth daily.    Marland Kitchen VITAMIN-B COMPLEX PO Oral Take 1 tablet by mouth daily.    Marland Kitchen KLONOPIN PO Oral Take 1 tablet by mouth 2 (two) times daily.    Marland Kitchen CLONIDINE HCL 0.2 MG PO TABS Oral Take 0.2 mg by mouth 2 (two) times daily.    Marland Kitchen PETROLEUM JELLY EX Apply externally Apply 1 application topically as needed. For itching    . TRIAMTERENE-HCTZ 75-50 MG PO TABS Oral Take 1 tablet by mouth daily.      BP 138/82  Pulse 102  Temp(Src) 98.3 F (36.8 C) (Oral)  Resp 20  SpO2 100%  Physical Exam  Nursing note and vitals reviewed. Constitutional: She is oriented to person, place,  and time. She appears well-developed and well-nourished. No distress.  HENT:  Head: Atraumatic.  Eyes: Conjunctivae are normal. Pupils are equal, round, and reactive to light. No scleral icterus.  Neck: Neck supple. No tracheal deviation present.  Cardiovascular: Normal rate, regular rhythm, normal heart sounds and intact distal pulses.  Exam reveals no gallop and no friction rub.   No murmur heard. Pulmonary/Chest: Effort normal. No respiratory distress. She exhibits tenderness.  Abdominal: Soft. Normal appearance. She exhibits no distension. There is no tenderness.  Musculoskeletal: Normal range of motion. She exhibits no edema and no  tenderness.  Neurological: She is alert and oriented to person, place, and time.       Motor intact bil. Steady gait  Skin: Skin is warm and dry. No rash noted.  Psychiatric:       Anxious. Denies SI.     ED Course  Procedures (including critical care time)  Results for orders placed during the hospital encounter of 03/12/12  COMPREHENSIVE METABOLIC PANEL      Component Value Range   Sodium 141  135 - 145 (mEq/L)   Potassium 2.3 (*) 3.5 - 5.1 (mEq/L)   Chloride 100  96 - 112 (mEq/L)   CO2 27  19 - 32 (mEq/L)   Glucose, Bld 142 (*) 70 - 99 (mg/dL)   BUN 14  6 - 23 (mg/dL)   Creatinine, Ser 1.61  0.50 - 1.10 (mg/dL)   Calcium 9.3  8.4 - 09.6 (mg/dL)   Total Protein 7.1  6.0 - 8.3 (g/dL)   Albumin 3.4 (*) 3.5 - 5.2 (g/dL)   AST 29  0 - 37 (U/L)   ALT 27  0 - 35 (U/L)   Alkaline Phosphatase 61  39 - 117 (U/L)   Total Bilirubin 0.4  0.3 - 1.2 (mg/dL)   GFR calc non Af Amer 54 (*) >90 (mL/min)   GFR calc Af Amer 63 (*) >90 (mL/min)  CBC      Component Value Range   WBC 6.7  4.0 - 10.5 (K/uL)   RBC 4.07  3.87 - 5.11 (MIL/uL)   Hemoglobin 12.5  12.0 - 15.0 (g/dL)   HCT 04.5  40.9 - 81.1 (%)   MCV 90.2  78.0 - 100.0 (fL)   MCH 30.7  26.0 - 34.0 (pg)   MCHC 34.1  30.0 - 36.0 (g/dL)   RDW 91.4  78.2 - 95.6 (%)   Platelets 287  150 - 400 (K/uL)  URINE RAPID DRUG SCREEN (HOSP PERFORMED)      Component Value Range   Opiates POSITIVE (*) NONE DETECTED    Cocaine POSITIVE (*) NONE DETECTED    Benzodiazepines POSITIVE (*) NONE DETECTED    Amphetamines NONE DETECTED  NONE DETECTED    Tetrahydrocannabinol NONE DETECTED  NONE DETECTED    Barbiturates NONE DETECTED  NONE DETECTED   ETHANOL      Component Value Range   Alcohol, Ethyl (B) <11  0 - 11 (mg/dL)  TROPONIN I      Component Value Range   Troponin I <0.30  <0.30 (ng/mL)  MAGNESIUM      Component Value Range   Magnesium 1.8  1.5 - 2.5 (mg/dL)  POTASSIUM      Component Value Range   Potassium 3.0 (*) 3.5 - 5.1 (mEq/L)    Dg Chest 2 View  03/12/2012  *RADIOLOGY REPORT*  Clinical Data: Cough  CHEST - 2 VIEW  Comparison: 12/21/2011  Findings: Increased interstitial markings.  No frank interstitial  edema.  No pleural effusion or pneumothorax.  Stable mild cardiomegaly.  Degenerative changes of the visualized thoracolumbar spine.  IMPRESSION: Stable mild cardiomegaly.  No frank interstitial edema.  Original Report Authenticated By: Charline Bills, M.D.      MDM  Ecg. Cxr.   Labs.  Reviewed nursing notes and prior charts for additional history.   Pt notes hx anxiety, out of meds for past couple days - states family took meds to beach with them Ativan 1 mg po.    Date: 03/12/2012  Rate: 93  Rhythm: normal sinus rhythm  QRS Axis: normal  Intervals: normal  ST/T Wave abnormalities: nonspecific T wave changes  Conduction Disutrbances:none  Narrative Interpretation:   Old EKG Reviewed: changes noted   Pt now notes in past couple days episodes chest tightness/discomfort.  Initial ecg and trop neg.   k low.   kcl iv and po.  Med service requests k replacement in ed. Pt indicates to them cp constant for 3-4 days after which troponin neg.  Will get telepsych consult re depression/anxiety, bipolar, pt currently off meds.   Recheck k improved.   Recheck pt alert, content. telepsych eval pending. Signed out to Dr Radford Pax, if telepsych feels clear for discharge, may d/c w rx kcl po, w plan for close pcp follow up/recheck K, as well as close follow up at [facility], and to provide pt with resource guide.        Suzi Roots, MD 03/12/12 1610  Suzi Roots, MD 03/12/12 1542  Suzi Roots, MD 03/12/12 [phone]

## 2012-03-12 NOTE — ED Notes (Signed)
Pt has multiple complaints, reports sob, chest pain, needing detox from drugs, last used two days ago. No acute distress noted at triage.

## 2012-03-12 NOTE — ED Notes (Signed)
Admitting MD at bedside.

## 2012-03-12 NOTE — Consult Note (Signed)
Patient seen and evaluated with Dr. Yaakov Guthrie. Agree with above note and A&P.

## 2012-03-12 NOTE — ED Notes (Signed)
Patient transported to X-ray 

## 2012-03-12 NOTE — ED Notes (Signed)
Requesting detox, last used crack Friday. States has been using daily for 1-2 weeks. Reports has not had her regular meds since Thursday. Lives with daughter, states she left Thursday for the beach & took all of her meds with her. Has been taking Xanax 4 times daily. Pt very restless, tremors & constant twitching. Poor historian, have to ask same question multiple times to get an answer. Will not make eye contact. Denies SI, HI.  No c/o CP presently, c/o intermittent SOB. States has had a non prod cough. Respirations even & unlabored, no distress.

## 2012-03-12 NOTE — Consult Note (Signed)
ED Consultation Note Date: 03/12/2012  Patient name: Danielle [name] Medical record number: [id number] Date of birth: [birth year] Age: 61 y.o. Gender: female PCP: [facility]   Chief Complaint: "Getting off drugs"  History of Present Illness:  61 year old with self reported history of paranoid schizophrenia, bipolar disorder with depression, anxiety.  Takes only xanax for her psychiatric conditions right now, seen by [facility].  Reports history of tardive dyskinesias and being on antipsychotics in past.  Patient presented to ED to "get off drugs".  Reports a long history of crack cocaine use for years.  Reports last used Crack two days ago.  Recently had a period of 2 days sober but has been on and off crack for years.    Patient reports 4-5 days of intermittent chest pain.  Central anterior chest with associated chest wall tenderness.  Hurts worse in cold when wind blows.  No change with eating.  Worse when rolling in bed or lifting things.  No nausea or vomiting.  No SOB. Cardiac catheterization June 2011 showed no coronary artery disease.    Had three stools yesterday. Has history of depression, mood has been up and down recently.  No suicidal ideation, but has had some thoughts of wishing she was hurt (but no thoughts of hurting herself).  She also believes that she has met me before, but I have never seen this patient before.    We have been asked by ED team to evaluate this patient for potential medical admission to [facility].   Meds:  Medication List  As of 03/12/2012 10:29 AM   ASK your doctor about these medications         ALPRAZolam 0.5 MG tablet   Commonly known as: XANAX   Take 0.5 mg by mouth 4 (four) times daily.      aspirin EC 81 MG tablet   Take 81 mg by mouth daily.      cloNIDine 0.2 MG tablet   Commonly known as: CATAPRES   Take 0.2 mg by mouth 2 (two) times daily.      KLONOPIN PO   Take 1 tablet by mouth 2 (two) times daily.      PETROLEUM JELLY  EX   Apply 1 application topically as needed. For itching      triamterene-hydrochlorothiazide 75-50 MG per tablet   Commonly known as: MAXZIDE   Take 1 tablet by mouth daily.      VITAMIN-B COMPLEX PO   Take 1 tablet by mouth daily.             Allergies: Allergies as of 03/12/2012 - Review Complete 03/12/2012  Allergen Reaction Noted  . Doxycycline  05/24/2008  . Fosinopril sodium Swelling 10/25/2008  . Penicillins  05/24/2008  . Sulfonamide derivatives     Past Medical History  Diagnosis Date  . Hypertension   . Asthma   . Depression   . Bipolar affective disorder, currently depressed, mild   . Stroke   . Coronary artery disease    Past Surgical History  Procedure Date  . Abdominal hysterectomy   . Cardiac catheterization    History reviewed. No pertinent family history. History   Social History  . Marital Status: Divorced    Spouse Name: N/A    Number of Children: N/A  . Years of Education: N/A   Occupational History  . Not on file.   Social History Main Topics  . Smoking status: Smokes 2 cigarettes per day  . Smokeless tobacco:  Not on file  . Alcohol Use: No     denies  . Drug Use: No     former user  . Sexually Active: Not Currently   Other Topics Concern  . Not on file   Social History Narrative  . No narrative on file    Review of Systems: Negative except as per HPI   Physical Exam: Blood pressure 145/84, pulse 93, temperature 98.3 F (36.8 C), temperature source Oral, resp. rate 20, SpO2 100.00%.  General: alert, well-developed, and cooperative to examination.  Head: normocephalic and atraumatic.  Eyes: vision grossly intact, pupils equal, pupils round, pupils reactive to light, no injection and anicteric.  Mouth: pharynx pink and moist, no erythema, and no exudates.  Neck: no JVD Lungs: normal respiratory effort, no accessory muscle use, normal breath sounds, no crackles, and no wheezes.  Heart: normal rate, regular rhythm, no  murmur, no gallop, and no rub.  Anterior chest wall tenderness that reproduces her chest pain.  Abdomen: soft, non-tender, normal bowel sounds, no distention, no guarding, no rebound tenderness Msk: no joint swelling, no joint warmth, and no redness over joints.  Pulses: 2+ DP/PT pulses bilaterally Extremities: No cyanosis, clubbing, edema Neurologic: alert & oriented X3, cranial nerves II-XII intact, strength normal in all extremities Skin: turgor normal and no rashes.      Lab results: Basic Metabolic Panel:  Basename 03/12/12 0946 03/12/12 0824  NA -- 141  K -- 2.3*  CL -- 100  CO2 -- 27  GLUCOSE -- 142*  BUN -- 14  CREATININE -- 1.10  CALCIUM -- 9.3  MG 1.8 --  PHOS -- --   Liver Function Tests:  Basename 03/12/12 0824  AST 29  ALT 27  ALKPHOS 61  BILITOT 0.4  PROT 7.1  ALBUMIN 3.4*   CBC:  Basename 03/12/12 0824  WBC 6.7  NEUTROABS --  HGB 12.5  HCT 36.7  MCV 90.2  PLT 287   Cardiac Enzymes:  Basename 03/12/12 0825  CKTOTAL --  CKMB --  CKMBINDEX --  TROPONINI <0.30   Urine Drug Screen: Drugs of Abuse     Component Value Date/Time   LABOPIA NONE DETECTED 01/21/2012 0853   LABOPIA NEG 08/01/2008 2209   COCAINSCRNUR POSITIVE* 01/21/2012 0853   COCAINSCRNUR NEG 08/01/2008 2209   LABBENZ POSITIVE* 01/21/2012 0853   LABBENZ NEG 08/01/2008 2209   AMPHETMU NONE DETECTED 01/21/2012 0853   AMPHETMU NEG 08/01/2008 2209   THCU NONE DETECTED 01/21/2012 0853   LABBARB NONE DETECTED 01/21/2012 0853    Alcohol Level:  Basename 03/12/12 0824  ETH <11    Imaging results:  Dg Chest 2 View  03/12/2012  *RADIOLOGY REPORT*  Clinical Data: Cough  CHEST - 2 VIEW  Comparison: 12/21/2011  Findings: Increased interstitial markings.  No frank interstitial edema.  No pleural effusion or pneumothorax.  Stable mild cardiomegaly.  Degenerative changes of the visualized thoracolumbar spine.  IMPRESSION: Stable mild cardiomegaly.  No frank interstitial edema.  Original Report  Authenticated By: Charline Bills, M.D.    Other results: EKG: Normal sinus rhythm.  Likely LVH.  Diffuse T wave flattening new from prior 12/21/11.    Assessment & Plan by Problem:  Chest Pain: Most likely MSK etiology given chest wall tenderness and pain with rolling in bed and lifting things.  No CAD on 2011 cardiac catheterization.  EKG unchanged from prior except for diffuse T wave flattening, likely due to hypokalemia.  Troponin I <0.30 and chest pain has been present  on and off for multiple days, so just this one negative troponin is sufficient for rule out given time of onset of pain >24 hrs ago.  ACS seems very unlikely in this patient.  Patient does not need overnight admission for further cardiac markers.  Cocaine related chest pain also possible but seems less likely than MSK etiology.  Recommend cessation of crack cocaine use.    Hypokalemia: Agree with ED team's combination of IV and oral supplementation in ED.  Her hypokalemia is likely due to her reported history of poor nutrition.  She stays with her two daughters and seems to be shuttled back and forth.  If she goes back home from ED recommend prescribing ensure supplements for her.    HTN: BPs look ok here.  Patient Reported History of Schizophrenia and Crack Cocaine Abuse: Patient may benefit from admission to inpatient detox facility.  Has history of going to detox facility March 2012 at [facility].  Patient believed she had met me before, but we have never met.  She reports history of schizophrenia and bipolar, but is only on Xanax for anxiety no mood stabilizer or antipsychotic.  Behavioral health admission may be of great benefit for her to get her psychiatric conditions under better control as well.  Recommend psychiatry consult.      Signed: Tyechia Allmendinger, BRAD 03/12/2012, 10:28 AM

## 2012-03-20 ENCOUNTER — Emergency Department (HOSPITAL_COMMUNITY)
Admission: EM | Admit: 2012-03-20 | Discharge: 2012-03-20 | Disposition: A | Discharge status: Home / Self Care | Payer: Medicare Other | Attending: Emergency Medicine | Admitting: Emergency Medicine

## 2012-03-20 ENCOUNTER — Encounter (HOSPITAL_COMMUNITY): Payer: Self-pay | Admitting: Emergency Medicine

## 2012-03-20 DIAGNOSIS — I10 Essential (primary) hypertension: Secondary | ICD-10-CM | POA: Insufficient documentation

## 2012-03-20 DIAGNOSIS — Z79899 Other long term (current) drug therapy: Secondary | ICD-10-CM | POA: Insufficient documentation

## 2012-03-20 DIAGNOSIS — F319 Bipolar disorder, unspecified: Secondary | ICD-10-CM | POA: Insufficient documentation

## 2012-03-20 DIAGNOSIS — Z7982 Long term (current) use of aspirin: Secondary | ICD-10-CM | POA: Insufficient documentation

## 2012-03-20 DIAGNOSIS — I2510 Atherosclerotic heart disease of native coronary artery without angina pectoris: Secondary | ICD-10-CM | POA: Insufficient documentation

## 2012-03-20 DIAGNOSIS — Z8673 Personal history of transient ischemic attack (TIA), and cerebral infarction without residual deficits: Secondary | ICD-10-CM | POA: Insufficient documentation

## 2012-03-20 DIAGNOSIS — E876 Hypokalemia: Secondary | ICD-10-CM | POA: Insufficient documentation

## 2012-03-20 DIAGNOSIS — Z87891 Personal history of nicotine dependence: Secondary | ICD-10-CM | POA: Insufficient documentation

## 2012-03-20 LAB — POCT I-STAT, CHEM 8
BUN: 24 mg/dL — ABNORMAL HIGH (ref 6–23)
Calcium, Ion: 1.21 mmol/L (ref 1.12–1.32)
Chloride: 107 mEq/L (ref 96–112)
Creatinine, Ser: 1 mg/dL (ref 0.50–1.10)
Glucose, Bld: 152 mg/dL — ABNORMAL HIGH (ref 70–99)
HCT: 37 % (ref 36.0–46.0)
Hemoglobin: 12.6 g/dL (ref 12.0–15.0)
Potassium: 3.2 mEq/L — ABNORMAL LOW (ref 3.5–5.1)
Sodium: 141 mEq/L (ref 135–145)
TCO2: 22 mmol/L (ref 0–100)

## 2012-03-20 MED ORDER — POTASSIUM CHLORIDE CRYS ER 20 MEQ PO TBCR: 20.0000 meq | EXTENDED_RELEASE_TABLET | Freq: Two times a day (BID) | ORAL | Status: DC

## 2012-03-20 MED ORDER — LOPERAMIDE HCL 2 MG PO CAPS: 2.0000 mg | ORAL_CAPSULE | Freq: Four times a day (QID) | ORAL | Status: AC | PRN

## 2012-03-20 NOTE — ED Notes (Signed)
Pt very anxious and tearful at triage waiting room. Requesting blanket, cold sandwich etc. Pt stated she was not a patient and was waiting for bus due to family unavailable to pick up and give ride. Pt ambulating in waiting room as well as outside without difficulty. No distress noted.

## 2012-03-20 NOTE — ED Notes (Signed)
Patient is AOx4 and comfortable with her discharge instructions. 

## 2012-03-20 NOTE — ED Notes (Addendum)
Patient with history of 2 days of diarrhea.  Also states her potassium may be low.

## 2012-03-20 NOTE — Discharge Instructions (Signed)
Take the potassium as prescribed, use the antidiarrhea medication only if you develop watery diarrhea. Drink plenty of fluids, call your Dr. in the morning for a recheck of her potassium within 10-14 days

## 2012-03-20 NOTE — ED Provider Notes (Signed)
History     CSN: [id number]  Arrival date & time 03/20/12  0408   First MD Initiated Contact with Patient 03/20/12 [phone]      Chief Complaint  Patient presents with  . Diarrhea    (Consider location/radiation/quality/duration/timing/severity/associated sxs/prior treatment) HPI Comments: 61 year old female with a history of cocaine and crack abuse who presents with 2 days of soft and loose stools. She states that she feels like her potassium may be low and sometimes she gets diarrhea with this. She denies fevers chills nausea vomiting cough abdominal pain back pain chest pain shortness of breath. The symptoms are persistent, mild, nothing makes it better or worse. According to the nursing staff the patient has been in the waiting room all night long and when asked if she could have a sandwich she was told no edema she checked in as a patient.  Patient is a 61 y.o. female presenting with diarrhea. The history is provided by the patient and medical records.  Diarrhea The primary symptoms include diarrhea.    Past Medical History  Diagnosis Date  . Hypertension   . Asthma   . Depression   . Bipolar affective disorder, currently depressed, mild   . Stroke   . Coronary artery disease     Past Surgical History  Procedure Date  . Abdominal hysterectomy   . Cardiac catheterization     No family history on file.  History  Substance Use Topics  . Smoking status: Former Games developer  . Smokeless tobacco: Not on file  . Alcohol Use: No     denies    OB History    Grav Para Term Preterm Abortions TAB SAB Ect Mult Living                  Review of Systems  Gastrointestinal: Positive for diarrhea.  All other systems reviewed and are negative.    Allergies  Doxycycline; Fosinopril sodium; Penicillins; and Sulfonamide derivatives  Home Medications   Current Outpatient Rx  Name Route Sig Dispense Refill  . ALPRAZOLAM 0.5 MG PO TABS Oral Take 0.5 mg by mouth 4 (four) times  daily.    . ASPIRIN EC 81 MG PO TBEC Oral Take 81 mg by mouth daily.    Marland Kitchen VITAMIN-B COMPLEX PO Oral Take 1 tablet by mouth daily.    Marland Kitchen CLONIDINE HCL 0.2 MG PO TABS Oral Take 0.2 mg by mouth 2 (two) times daily.    Marland Kitchen POTASSIUM CHLORIDE CRYS ER 20 MEQ PO TBCR Oral Take 20 mEq by mouth 2 (two) times daily.    . TRIAMTERENE-HCTZ 75-50 MG PO TABS Oral Take 1 tablet by mouth daily.    Marland Kitchen LOPERAMIDE HCL 2 MG PO CAPS Oral Take 1 capsule (2 mg total) by mouth 4 (four) times daily as needed for diarrhea or loose stools. 12 capsule 0  . POTASSIUM CHLORIDE CRYS ER 20 MEQ PO TBCR Oral Take 1 tablet (20 mEq total) by mouth 2 (two) times daily. 10 tablet 0    BP 150/79  Pulse 79  Temp(Src) 98.5 F (36.9 C) (Oral)  Resp 20  SpO2 100%  Physical Exam  Nursing note and vitals reviewed. Constitutional: She appears well-developed and well-nourished. No distress.  HENT:  Head: Normocephalic and atraumatic.  Mouth/Throat: Oropharynx is clear and moist. No oropharyngeal exudate.  Eyes: Conjunctivae and EOM are normal. Pupils are equal, round, and reactive to light. Right eye exhibits no discharge. Left eye exhibits no discharge. No scleral icterus.  Neck:  Normal range of motion. Neck supple. No JVD present. No thyromegaly present.  Cardiovascular: Normal rate, regular rhythm, normal heart sounds and intact distal pulses.  Exam reveals no gallop and no friction rub.   No murmur heard. Pulmonary/Chest: Effort normal and breath sounds normal. No respiratory distress. She has no wheezes. She has no rales.  Abdominal: Soft. She exhibits no distension and no mass. There is no tenderness.       Slight increased bowel sounds  Musculoskeletal: Normal range of motion. She exhibits no edema and no tenderness.  Lymphadenopathy:    She has no cervical adenopathy.  Neurological: She is alert. Coordination normal.       Normal speech coordination and no tremor  Skin: Skin is warm and dry. No rash noted. No erythema.    Psychiatric: She has a normal mood and affect. Her behavior is normal.    ED Course  Procedures (including critical care time)  Labs Reviewed  POCT I-STAT, CHEM 8 - Abnormal; Notable for the following:    Potassium 3.2 (*)    BUN 24 (*)    Glucose, Bld 152 (*)    All other components within normal limits   No results found.   1. Hypokalemia       MDM  The patient appears well, has a nontender and soft abdomen with normal vital signs. Check potassium on a stat chemistry, discharge if normal. May use over-the-counter medications for diarrhea when necessary   Potassium level slightly low, patient given prescription for potassium supplementation, Imodium when necessary diarrhea, patient appears stable for discharge.  Vida Roller, MD 03/20/12 [phone]

## 2012-04-05 ENCOUNTER — Encounter (HOSPITAL_COMMUNITY): Payer: Self-pay | Admitting: *Deleted

## 2012-04-05 ENCOUNTER — Emergency Department (HOSPITAL_COMMUNITY)
Admission: EM | Admit: 2012-04-05 | Discharge: 2012-04-05 | Disposition: A | Discharge status: Home / Self Care | Payer: Medicare Other | Attending: Emergency Medicine | Admitting: Emergency Medicine

## 2012-04-05 ENCOUNTER — Emergency Department (HOSPITAL_COMMUNITY): Payer: Medicare Other

## 2012-04-05 DIAGNOSIS — R064 Hyperventilation: Secondary | ICD-10-CM | POA: Insufficient documentation

## 2012-04-05 DIAGNOSIS — R0789 Other chest pain: Secondary | ICD-10-CM | POA: Insufficient documentation

## 2012-04-05 DIAGNOSIS — J45909 Unspecified asthma, uncomplicated: Secondary | ICD-10-CM | POA: Insufficient documentation

## 2012-04-05 DIAGNOSIS — Z8673 Personal history of transient ischemic attack (TIA), and cerebral infarction without residual deficits: Secondary | ICD-10-CM | POA: Insufficient documentation

## 2012-04-05 DIAGNOSIS — I10 Essential (primary) hypertension: Secondary | ICD-10-CM | POA: Insufficient documentation

## 2012-04-05 DIAGNOSIS — Z79899 Other long term (current) drug therapy: Secondary | ICD-10-CM | POA: Insufficient documentation

## 2012-04-05 DIAGNOSIS — F1410 Cocaine abuse, uncomplicated: Secondary | ICD-10-CM | POA: Insufficient documentation

## 2012-04-05 DIAGNOSIS — F419 Anxiety disorder, unspecified: Secondary | ICD-10-CM

## 2012-04-05 DIAGNOSIS — F411 Generalized anxiety disorder: Secondary | ICD-10-CM | POA: Insufficient documentation

## 2012-04-05 LAB — RAPID URINE DRUG SCREEN, HOSP PERFORMED
Amphetamines: NOT DETECTED
Barbiturates: NOT DETECTED
Benzodiazepines: POSITIVE — AB
Cocaine: POSITIVE — AB
Opiates: NOT DETECTED
Tetrahydrocannabinol: NOT DETECTED

## 2012-04-05 LAB — CBC
HCT: 41.8 % (ref 36.0–46.0)
Hemoglobin: 14.2 g/dL (ref 12.0–15.0)
MCH: 31.1 pg (ref 26.0–34.0)
MCHC: 34 g/dL (ref 30.0–36.0)
MCV: 91.7 fL (ref 78.0–100.0)
Platelets: 287 10*3/uL (ref 150–400)
RBC: 4.56 MIL/uL (ref 3.87–5.11)
RDW: 13.5 % (ref 11.5–15.5)
WBC: 7.4 10*3/uL (ref 4.0–10.5)

## 2012-04-05 LAB — BASIC METABOLIC PANEL
BUN: 14 mg/dL (ref 6–23)
CO2: 28 mEq/L (ref 19–32)
Calcium: 9.8 mg/dL (ref 8.4–10.5)
Chloride: 102 mEq/L (ref 96–112)
Creatinine, Ser: 0.84 mg/dL (ref 0.50–1.10)
GFR calc Af Amer: 87 mL/min — ABNORMAL LOW (ref >90)
GFR calc non Af Amer: 75 mL/min — ABNORMAL LOW (ref >90)
Glucose, Bld: 90 mg/dL (ref 70–99)
Potassium: 3.7 mEq/L (ref 3.5–5.1)
Sodium: 141 mEq/L (ref 135–145)

## 2012-04-05 LAB — ETHANOL: Alcohol, Ethyl (B): <11 mg/dL (ref 0–11)

## 2012-04-05 LAB — TROPONIN I: Troponin I: <0.3 ng/mL (ref <0.30)

## 2012-04-05 MED ORDER — LORAZEPAM 1 MG PO TABS: 1.0000 mg | ORAL_TABLET | Freq: Three times a day (TID) | ORAL | Status: AC | PRN

## 2012-04-05 MED ORDER — LORAZEPAM 1 MG PO TABS
2.0000 mg | ORAL_TABLET | Freq: Once | ORAL | Status: AC
  Administered 2012-04-05: 2 mg via ORAL
  Filled 2012-04-05: qty 2

## 2012-04-05 NOTE — ED Provider Notes (Signed)
History     CSN: [id number]  Arrival date & time 04/05/12  0857   First MD Initiated Contact with Patient 04/05/12 [id number]      Chief Complaint  Patient presents with  . Anxiety  . Hyperventilating  . Chest Pain    (Consider location/radiation/quality/duration/timing/severity/associated sxs/prior treatment) The history is provided by the patient.   the patient presents with what she describes as anxiety.  She reports she is extremely nervous.  She has had some mild chest tightness without radiation of her discomfort.  She denies shortness of breath.  She reports recently she ran out of her Xanax and because she missed her psychiatric appointment she was unable to get a refill of her medications.  Per the medical record she has a long-standing history of cocaine abuse.  She denies recent cocaine use.  She reports no history of stented coronary arteries.  She does report a history of bipolar disorder and stroke as well as hypertension and asthma.  She reports no shortness of breath at this time.  She denies fevers or chills.  Nothing improves her symptoms nothing worsens her symptoms.  Her symptoms are constant.  Her symptoms are mild to moderate in severity. Past Medical History  Diagnosis Date  . Hypertension   . Asthma   . Depression   . Bipolar affective disorder, currently depressed, mild   . Stroke   . Coronary artery disease     Past Surgical History  Procedure Date  . Abdominal hysterectomy   . Cardiac catheterization     No family history on file.  History  Substance Use Topics  . Smoking status: Former Games developer  . Smokeless tobacco: Not on file  . Alcohol Use: No     denies    OB History    Grav Para Term Preterm Abortions TAB SAB Ect Mult Living                  Review of Systems  Cardiovascular: Positive for chest pain.  All other systems reviewed and are negative.    Allergies  Doxycycline; Fosinopril sodium; Penicillins; and Sulfonamide  derivatives  Home Medications   Current Outpatient Rx  Name Route Sig Dispense Refill  . ALPRAZOLAM 0.5 MG PO TABS Oral Take 0.5 mg by mouth 4 (four) times daily.    . ASPIRIN EC 81 MG PO TBEC Oral Take 81 mg by mouth daily.    Marland Kitchen VITAMIN-B COMPLEX PO Oral Take 1 tablet by mouth daily.    Marland Kitchen CLONIDINE HCL 0.2 MG PO TABS Oral Take 0.2 mg by mouth 2 (two) times daily.    Marland Kitchen POTASSIUM CHLORIDE CRYS ER 20 MEQ PO TBCR Oral Take 20 mEq by mouth 2 (two) times daily.    . TRIAMTERENE-HCTZ 75-50 MG PO TABS Oral Take 1 tablet by mouth daily.      BP 152/93  Pulse 94  Temp 98.9 F (37.2 C) (Oral)  Resp 26  SpO2 100%  Physical Exam  Nursing note and vitals reviewed. Constitutional: She is oriented to person, place, and time. She appears well-developed and well-nourished. No distress.  HENT:  Head: Normocephalic and atraumatic.  Eyes: EOM are normal.  Neck: Normal range of motion.  Cardiovascular: Regular rhythm and normal heart sounds.        Tachycardia  Pulmonary/Chest: Effort normal and breath sounds normal.       Tachypnea  Abdominal: Soft. She exhibits no distension. There is no tenderness.  Musculoskeletal: Normal range of motion.  Neurological: She is alert and oriented to person, place, and time.  Skin: Skin is warm and dry.  Psychiatric: Her mood appears anxious. Her speech is rapid and/or pressured. She is is hyperactive. Thought content is not delusional. She expresses no suicidal plans and no homicidal plans.    ED Course  Procedures (including critical care time)   Date: 04/05/2012  Rate: 99  Rhythm: normal sinus rhythm  QRS Axis: normal  Intervals: normal  ST/T Wave abnormalities: normal  Conduction Disutrbances: none  Narrative Interpretation:   Old EKG Reviewed: No significant changes noted     Labs Reviewed  BASIC METABOLIC PANEL - Abnormal; Notable for the following:    GFR calc non Af Amer 75 (*)     GFR calc Af Amer 87 (*)     All other components  within normal limits  URINE RAPID DRUG SCREEN (HOSP PERFORMED) - Abnormal; Notable for the following:    Cocaine POSITIVE (*)     Benzodiazepines POSITIVE (*)     All other components within normal limits  CBC  ETHANOL  TROPONIN I  LAB REPORT - SCANNED   Dg Chest 2 View  04/05/2012  *RADIOLOGY REPORT*  Clinical Data: Chest pain.  CHEST - 2 VIEW  Comparison: 03/12/2012  Findings: Heart size upper normal limits.  Hypoaeration results in mild hemidiaphragm elevation.  Allowing for this, no focal consolidation, pleural effusion, or pneumothorax is identified. Mild interstitial prominence is similar to prior.  No acute osseous finding.  Multilevel degenerative change.  IMPRESSION: Increased interstitial markings, similar to prior.  No focal consolidation.  Original Report Authenticated By: Waneta Martins, M.D.   i personally reviewed her imaging tests i reviewed her prior ER/hospitalization records available in the EMR   1. Anxiety       MDM  Her symptoms seem most consistent with anxiety and medication noncompliance. Doubt ACS and PE. Well appearing. Ecg, labs and cxr without significant abnormality. Pt continues to abuse cocaine. i think this is exacerbating her symptoms. i've recommended stopping drug abuse. Short course of ativan prescribed. Pt does not appear to be a threat to herself or others        Lyanne Co, MD 04/06/12 1341

## 2012-04-05 NOTE — ED Notes (Signed)
Pharm tech at pt bedside

## 2012-04-05 NOTE — ED Notes (Signed)
Pt remains on monitor and self-administering 02 via NRB.  Pt also complains of headache starting last night and is a 9/10. Pt reports history of migraines and states this headache is not as severe as a migraine. Pt states "I think the headache is coming from not having my medication for anxiety."

## 2012-04-05 NOTE — Discharge Instructions (Signed)

## 2012-04-05 NOTE — ED Notes (Signed)
Pt taken to xray 

## 2012-04-05 NOTE — ED Notes (Signed)
MWU:XL24<MW> Expected date:04/05/12<BR> Expected time: 8:50 AM<BR> Means of arrival:Ambulance<BR> Comments:<BR> Trouble breathing

## 2012-04-05 NOTE — ED Notes (Signed)
Per pt. Pt reports she has had issues with anxiety over the past week and has been out of her xanax for over a week. Pt states she takes her xanax 4 times a day. Pt reports non-productive cough and wheezing.  Pt denies having an inhaler. Pt reports chest pressure initially this AM and shortness of breath. Pt reports chest tightness has improved, but continues to feel short of breath. Pt reports NRB makes her feel better. 02 saturations 99% on RA. Lungs CTAB.  Pt appears anxious.

## 2012-04-05 NOTE — ED Notes (Signed)
Pt assisted to restroom for urine sample. Pt in no apparent distress and requires minimal assistance

## 2012-04-05 NOTE — ED Notes (Signed)
Per EMS. Pt is from home. Pt here for anxiety and tachypnea. Pt has been out of xanax for over a week and feeling anxious. Pt also reporting chest tightness. EMS did a 12 lead which was sinus tach with PVCs.  Pt has an appointment in the future to see MD for more medication for anxiety, but is unsure when the date of this appointment is.  Vitals stable per EMS. Lungs CTAB and 02 saturations 99% on RA

## 2012-04-27 ENCOUNTER — Encounter (HOSPITAL_COMMUNITY): Payer: Self-pay

## 2012-04-27 ENCOUNTER — Emergency Department (HOSPITAL_COMMUNITY)
Admission: EM | Admit: 2012-04-27 | Discharge: 2012-04-27 | Disposition: A | Discharge status: Home / Self Care | Payer: Medicare Other | Source: Home / Self Care | Attending: Emergency Medicine | Admitting: Emergency Medicine

## 2012-04-27 ENCOUNTER — Inpatient Hospital Stay (HOSPITAL_COMMUNITY)
Admission: RE | Admit: 2012-04-27 | Discharge: 2012-05-08 | DRG: 885 | Disposition: A | Discharge status: Home / Self Care | Payer: Medicare Other | Attending: Psychiatry | Admitting: Psychiatry

## 2012-04-27 ENCOUNTER — Encounter (HOSPITAL_COMMUNITY): Payer: Self-pay | Admitting: Emergency Medicine

## 2012-04-27 DIAGNOSIS — F17200 Nicotine dependence, unspecified, uncomplicated: Secondary | ICD-10-CM | POA: Diagnosis present

## 2012-04-27 DIAGNOSIS — G43909 Migraine, unspecified, not intractable, without status migrainosus: Secondary | ICD-10-CM

## 2012-04-27 DIAGNOSIS — I10 Essential (primary) hypertension: Secondary | ICD-10-CM | POA: Insufficient documentation

## 2012-04-27 DIAGNOSIS — J45909 Unspecified asthma, uncomplicated: Secondary | ICD-10-CM

## 2012-04-27 DIAGNOSIS — F32A Depression, unspecified: Secondary | ICD-10-CM

## 2012-04-27 DIAGNOSIS — F1910 Other psychoactive substance abuse, uncomplicated: Secondary | ICD-10-CM | POA: Insufficient documentation

## 2012-04-27 DIAGNOSIS — Z8673 Personal history of transient ischemic attack (TIA), and cerebral infarction without residual deficits: Secondary | ICD-10-CM | POA: Insufficient documentation

## 2012-04-27 DIAGNOSIS — F329 Major depressive disorder, single episode, unspecified: Secondary | ICD-10-CM

## 2012-04-27 DIAGNOSIS — F419 Anxiety disorder, unspecified: Secondary | ICD-10-CM

## 2012-04-27 DIAGNOSIS — I2510 Atherosclerotic heart disease of native coronary artery without angina pectoris: Secondary | ICD-10-CM | POA: Diagnosis present

## 2012-04-27 DIAGNOSIS — R45851 Suicidal ideations: Secondary | ICD-10-CM

## 2012-04-27 DIAGNOSIS — Z79899 Other long term (current) drug therapy: Secondary | ICD-10-CM

## 2012-04-27 DIAGNOSIS — F1210 Cannabis abuse, uncomplicated: Secondary | ICD-10-CM

## 2012-04-27 DIAGNOSIS — Z8672 Personal history of thrombophlebitis: Secondary | ICD-10-CM

## 2012-04-27 DIAGNOSIS — F3289 Other specified depressive episodes: Secondary | ICD-10-CM

## 2012-04-27 DIAGNOSIS — J069 Acute upper respiratory infection, unspecified: Secondary | ICD-10-CM

## 2012-04-27 DIAGNOSIS — Z9119 Patient's noncompliance with other medical treatment and regimen: Secondary | ICD-10-CM

## 2012-04-27 DIAGNOSIS — N760 Acute vaginitis: Secondary | ICD-10-CM

## 2012-04-27 DIAGNOSIS — K219 Gastro-esophageal reflux disease without esophagitis: Secondary | ICD-10-CM

## 2012-04-27 DIAGNOSIS — F200 Paranoid schizophrenia: Secondary | ICD-10-CM

## 2012-04-27 DIAGNOSIS — F1994 Other psychoactive substance use, unspecified with psychoactive substance-induced mood disorder: Secondary | ICD-10-CM

## 2012-04-27 DIAGNOSIS — F1410 Cocaine abuse, uncomplicated: Secondary | ICD-10-CM

## 2012-04-27 DIAGNOSIS — Z9149 Other personal history of psychological trauma, not elsewhere classified: Secondary | ICD-10-CM

## 2012-04-27 DIAGNOSIS — Z91199 Patient's noncompliance with other medical treatment and regimen due to unspecified reason: Secondary | ICD-10-CM

## 2012-04-27 DIAGNOSIS — J309 Allergic rhinitis, unspecified: Secondary | ICD-10-CM

## 2012-04-27 DIAGNOSIS — F411 Generalized anxiety disorder: Secondary | ICD-10-CM

## 2012-04-27 DIAGNOSIS — E785 Hyperlipidemia, unspecified: Secondary | ICD-10-CM

## 2012-04-27 LAB — RAPID URINE DRUG SCREEN, HOSP PERFORMED
Amphetamines: NOT DETECTED
Barbiturates: NOT DETECTED
Benzodiazepines: POSITIVE — AB
Cocaine: POSITIVE — AB
Opiates: NOT DETECTED
Tetrahydrocannabinol: NOT DETECTED

## 2012-04-27 LAB — ETHANOL: Alcohol, Ethyl (B): <11 mg/dL (ref 0–11)

## 2012-04-27 LAB — COMPREHENSIVE METABOLIC PANEL
ALT: 11 U/L (ref 0–35)
AST: 21 U/L (ref 0–37)
Albumin: 3.9 g/dL (ref 3.5–5.2)
Alkaline Phosphatase: 76 U/L (ref 39–117)
BUN: 15 mg/dL (ref 6–23)
CO2: 23 mEq/L (ref 19–32)
Calcium: 9.8 mg/dL (ref 8.4–10.5)
Chloride: 99 mEq/L (ref 96–112)
Creatinine, Ser: 0.88 mg/dL (ref 0.50–1.10)
GFR calc Af Amer: 82 mL/min — ABNORMAL LOW (ref >90)
GFR calc non Af Amer: 71 mL/min — ABNORMAL LOW (ref >90)
Glucose, Bld: 100 mg/dL — ABNORMAL HIGH (ref 70–99)
Potassium: 2.9 mEq/L — ABNORMAL LOW (ref 3.5–5.1)
Sodium: 135 mEq/L (ref 135–145)
Total Bilirubin: 0.3 mg/dL (ref 0.3–1.2)
Total Protein: 7.8 g/dL (ref 6.0–8.3)

## 2012-04-27 LAB — ACETAMINOPHEN LEVEL: Acetaminophen (Tylenol), Serum: <15 ug/mL (ref 10–30)

## 2012-04-27 LAB — POCT I-STAT, CHEM 8
BUN: 14 mg/dL (ref 6–23)
Calcium, Ion: 1.23 mmol/L (ref 1.12–1.23)
Chloride: 105 mEq/L (ref 96–112)
Creatinine, Ser: 1 mg/dL (ref 0.50–1.10)
Glucose, Bld: 95 mg/dL (ref 70–99)
HCT: 40 % (ref 36.0–46.0)
Hemoglobin: 13.6 g/dL (ref 12.0–15.0)
Potassium: 3.3 mEq/L — ABNORMAL LOW (ref 3.5–5.1)
Sodium: 140 mEq/L (ref 135–145)
TCO2: 24 mmol/L (ref 0–100)

## 2012-04-27 LAB — CBC
HCT: 41.1 % (ref 36.0–46.0)
Hemoglobin: 13.9 g/dL (ref 12.0–15.0)
MCH: 30.5 pg (ref 26.0–34.0)
MCHC: 33.8 g/dL (ref 30.0–36.0)
MCV: 90.3 fL (ref 78.0–100.0)
Platelets: 290 10*3/uL (ref 150–400)
RBC: 4.55 MIL/uL (ref 3.87–5.11)
RDW: 12.8 % (ref 11.5–15.5)
WBC: 7 10*3/uL (ref 4.0–10.5)

## 2012-04-27 MED ORDER — BENZTROPINE MESYLATE 1 MG PO TABS
1.0000 mg | ORAL_TABLET | Freq: Two times a day (BID) | ORAL | Status: DC
  Administered 2012-04-27: 1 mg via ORAL
  Filled 2012-04-27: qty 1

## 2012-04-27 MED ORDER — LORAZEPAM 1 MG PO TABS: 1.0000 mg | ORAL_TABLET | Freq: Three times a day (TID) | ORAL | Status: DC | PRN

## 2012-04-27 MED ORDER — PANTOPRAZOLE SODIUM 20 MG PO TBEC
20.0000 mg | DELAYED_RELEASE_TABLET | Freq: Every day | ORAL | Status: DC
  Administered 2012-04-27 – 2012-05-08 (×12): 20 mg via ORAL
  Filled 2012-04-27 (×5): qty 1
  Filled 2012-04-27: qty 3
  Filled 2012-04-27 (×8): qty 1

## 2012-04-27 MED ORDER — LIDOCAINE 5 % EX PTCH
1.0000 | MEDICATED_PATCH | CUTANEOUS | Status: DC
  Administered 2012-04-27 – 2012-05-07 (×11): 1 via TRANSDERMAL
  Filled 2012-04-27 (×8): qty 1
  Filled 2012-04-27: qty 3
  Filled 2012-04-27 (×2): qty 1
  Filled 2012-04-27: qty 3
  Filled 2012-04-27 (×3): qty 1

## 2012-04-27 MED ORDER — ZOLPIDEM TARTRATE 5 MG PO TABS: 5.0000 mg | ORAL_TABLET | Freq: Every evening | ORAL | Status: DC | PRN

## 2012-04-27 MED ORDER — CLONIDINE HCL 0.1 MG/24HR TD PTWK
0.1000 mg | MEDICATED_PATCH | TRANSDERMAL | Status: DC
  Administered 2012-04-27: 0.1 mg via TRANSDERMAL
  Filled 2012-04-27 (×2): qty 1

## 2012-04-27 MED ORDER — MAGNESIUM HYDROXIDE 400 MG/5ML PO SUSP: 30.0000 mL | Freq: Every day | ORAL | Status: DC | PRN

## 2012-04-27 MED ORDER — TRAZODONE HCL 100 MG PO TABS: 100.0000 mg | ORAL_TABLET | Freq: Every day | ORAL | Status: DC

## 2012-04-27 MED ORDER — AMITRIPTYLINE HCL 25 MG PO TABS
25.0000 mg | ORAL_TABLET | Freq: Every evening | ORAL | Status: DC | PRN
  Administered 2012-04-27: 25 mg via ORAL
  Filled 2012-04-27 (×4): qty 1

## 2012-04-27 MED ORDER — ALPRAZOLAM 0.5 MG PO TABS
0.5000 mg | ORAL_TABLET | Freq: Four times a day (QID) | ORAL | Status: DC
  Administered 2012-04-27 (×2): 0.5 mg via ORAL
  Filled 2012-04-27 (×2): qty 1

## 2012-04-27 MED ORDER — ALUM & MAG HYDROXIDE-SIMETH 200-200-20 MG/5ML PO SUSP: 30.0000 mL | ORAL | Status: DC | PRN

## 2012-04-27 MED ORDER — GABAPENTIN 100 MG PO CAPS
100.0000 mg | ORAL_CAPSULE | Freq: Four times a day (QID) | ORAL | Status: DC
  Administered 2012-04-27 – 2012-04-28 (×3): 100 mg via ORAL
  Filled 2012-04-27 (×8): qty 1

## 2012-04-27 MED ORDER — IBUPROFEN 600 MG PO TABS: 600.0000 mg | ORAL_TABLET | Freq: Three times a day (TID) | ORAL | Status: DC | PRN

## 2012-04-27 MED ORDER — ONDANSETRON HCL 4 MG PO TABS: 4.0000 mg | ORAL_TABLET | Freq: Three times a day (TID) | ORAL | Status: DC | PRN

## 2012-04-27 MED ORDER — MELOXICAM 7.5 MG PO TABS
7.5000 mg | ORAL_TABLET | Freq: Every day | ORAL | Status: DC
  Administered 2012-04-27 – 2012-05-03 (×7): 7.5 mg via ORAL
  Filled 2012-04-27 (×9): qty 1

## 2012-04-27 MED ORDER — ACETAMINOPHEN 325 MG PO TABS: 650.0000 mg | ORAL_TABLET | ORAL | Status: DC | PRN

## 2012-04-27 MED ORDER — ACETAMINOPHEN 325 MG PO TABS
650.0000 mg | ORAL_TABLET | Freq: Four times a day (QID) | ORAL | Status: DC | PRN
  Administered 2012-05-01 – 2012-05-04 (×2): 650 mg via ORAL

## 2012-04-27 MED ORDER — POTASSIUM CHLORIDE CRYS ER 20 MEQ PO TBCR
40.0000 meq | EXTENDED_RELEASE_TABLET | Freq: Once | ORAL | Status: AC
  Administered 2012-04-27: 40 meq via ORAL
  Filled 2012-04-27: qty 2

## 2012-04-27 NOTE — BH Assessment (Addendum)
Assessment Note   Danielle [name] is an 61 y.o. female. Walked in accompanied by niece to be assessed for inpatient hospitalization. She is tearful at times during interview. She reports being schizophrenic and has severe tardive dyskinesia making her difficult to understand at times and has worm like movements of her upper torso.. She is currently a patient at [facility] and states was seen there one week ago.States significantly worse since seen at [facility] in that she is endorsing auditory hallucinations and not sleeping at night but waling the streets because she is afraid to be at home.Endorses auditory hallucinations. Is using crack three times a week and last use was last night. Is passively suicidal not a specific plan but states would slowly kill self with her drug use and is carelessly walking in front of traffic not paying attention to cars..No prior hx of suicide attempts. Denies any alcohol use times one week.States she has been struggling with mental illness since she was 61yo and shes tired. States her two daughters and her grandchildren wont have anything to do with her and that is upsetting to her. She believes if she was dead at least she could give her kids money and they would be happier than her living. Niece is supportive but only been present one month and lives next door to her. She states she is concerned with her. Has not used her blood pressure medications in at least one month and cant name the second med she takes, only the HCTZ. Not homicidal. Requesting admission to the adult unit. Sent to [facility] for medical clearance, niece had to leave to client transported to ED per our security and tech. Plan to be admitted to adult unit pending medical clearance and an available bed. Expressed her appreciation for the help.  Axis I: Chronic Paranoid Schizophrenia and Cocaine dependence Axis II: Deferred Axis III:  Past Medical History  Diagnosis Date  . Hypertension   . Asthma   .  Depression   . Bipolar affective disorder, currently depressed, mild   . Stroke   . Coronary artery disease    Axis IV: other psychosocial or environmental problems and problems with primary support group Axis V: GAF=30  Past Medical History:  Past Medical History  Diagnosis Date  . Hypertension   . Asthma   . Depression   . Bipolar affective disorder, currently depressed, mild   . Stroke   . Coronary artery disease   tardive dyskinesia  Past Surgical History  Procedure Date  . Abdominal hysterectomy   . Cardiac catheterization     Family History:  Family History  Problem Relation Age of Onset  . Hypertension Mother     Social History:  reports that she has been smoking Cigarettes.  She does not have any smokeless tobacco history on file. She reports that she drinks alcohol. She reports that she uses illicit drugs (Cocaine and Marijuana).  Additional Social History:  Alcohol / Drug Use Pain Medications: not abusing Prescriptions: not abusing Over the Counter: not abusing History of alcohol / drug use?: Yes Substance #1 Name of Substance 1: alcohol 1 - Age of First Use: unknown 1 - Amount (size/oz): beer 1 - Frequency: daily 1 - Duration: years 1 - Last Use / Amount: one week ago Substance #2 Name of Substance 2: crack 2 - Age of First Use: unknown 2 - Amount (size/oz): 60.00  2 - Frequency: three times a week 2 - Duration: years 2 - Last Use / Amount: 04/26/2012  60.00 dollars in use  CIWA:   COWS:    Allergies:  Allergies  Allergen Reactions  . Doxycycline     swelling  . Fosinopril Sodium Swelling     swelling face,lips-angioedema from ACE I  . Penicillins     swelling  . Sulfonamide Derivatives     unknown    Home Medications:  (Not in a hospital admission)  OB/GYN Status:  No LMP recorded. Patient has had a hysterectomy.  General Assessment Data Location of Assessment: [facility] Assessment Services Living Arrangements: Alone Can pt return to  current living arrangement?: Yes Admission Status: Voluntary Is patient capable of signing voluntary admission?: Yes Transfer from: Home Referral Source: Self/Family/Friend  Education Status Is patient currently in school?: No  Risk to self Suicidal Ideation: Yes-Currently Present Suicidal Intent: Yes-Currently Present Is patient at risk for suicide?: Yes Suicidal Plan?: Yes-Currently Present Specify Current Suicidal Plan: passively suicidal walking carelessly in traffic, slowly kiling self per her report with drugs and alcohol Access to Means: Yes Specify Access to Suicidal Means: hit by a car  What has been your use of drugs/alcohol within the last 12 months?: no alchohol in a week and crack three times a week last used yesterday Previous Attempts/Gestures: No Triggers for Past Attempts: Other (Comment) Intentional Self Injurious Behavior: None Family Suicide History: Unknown Recent stressful life event(s): Other (Comment) (estranged from her children and grandchidren and chronic MI) Persecutory voices/beliefs?: No Depression: Yes Depression Symptoms: Insomnia;Fatigue;Guilt;Feeling worthless/self pity Substance abuse history and/or treatment for substance abuse?: Yes Suicide prevention information given to non-admitted patients: Not applicable  Risk to Others Homicidal Ideation: No Thoughts of Harm to Others: No Current Homicidal Intent: No Current Homicidal Plan: No Access to Homicidal Means: No History of harm to others?: No Assessment of Violence: None Noted Does patient have access to weapons?: No Criminal Charges Pending?: No Does patient have a court date: No  Psychosis Hallucinations: Auditory Delusions: None noted  Mental Status Report Appear/Hygiene:  (has severe TD) Eye Contact: Good Motor Activity:  (severe TD) Speech: Logical/coherent;Slow Level of Consciousness: Alert Mood: Depressed;Sad Affect: Sad Anxiety Level: None Thought Processes:  Coherent Judgement: Impaired Orientation: Person;Place;Time;Situation Obsessive Compulsive Thoughts/Behaviors: None  Cognitive Functioning Concentration: Decreased Memory: Recent Intact;Remote Intact IQ: Average Insight: Fair Impulse Control: Fair Appetite: Fair Sleep: Decreased Total Hours of Sleep:  (wandering outside at night afraid of inside for three nights) Vegetative Symptoms: None  ADLScreening [facility] Assessment Services) Patient's cognitive ability adequate to safely complete daily activities?: Yes Patient able to express need for assistance with ADLs?: Yes Independently performs ADLs?: Yes     Prior Inpatient Therapy Prior Inpatient Therapy: Yes Prior Therapy Dates:  (unknown)  Prior Outpatient Therapy Prior Outpatient Therapy: Yes Prior Therapy Dates: current (seeing outpatient tx on and off since 61yo) Prior Therapy Facilty/Provider(s): [facility] Reason for Treatment: schiophrenia  ADL Screening (condition at time of admission) Patient's cognitive ability adequate to safely complete daily activities?: Yes Patient able to express need for assistance with ADLs?: Yes Independently performs ADLs?: Yes Weakness of Legs: None Weakness of Arms/Hands: None  Home Assistive Devices/Equipment Home Assistive Devices/Equipment: None          Advance Directives (For Healthcare) Advance Directive: Patient does not have advance directive Pre-existing out of facility DNR order (yellow form or pink MOST form): No Nutrition Screen Diet: Regular Unintentional weight loss greater than 10lbs within the last month: No Problems chewing or swallowing foods and/or liquids: No Home Tube Feeding or Total Parenteral Nutrition (TPN):  No Patient appears severely malnourished: No Pregnant or Lactating: No  Additional Information 1:1 In Past 12 Months?: No CIRT Risk: No Elopement Risk: No Does patient have medical clearance?: No (sent to [facility] for medical clearance not taking BP  meds)     Disposition:  Disposition Disposition of Patient: Referred to [facility] for medical clearance)  On Site Evaluation by:   Reviewed with Physician:     Wynona Luna 04/27/2012 12:49 PM

## 2012-04-27 NOTE — Progress Notes (Signed)
D   Pt is 61 year old female admitted with substance abuse, depression with suicidal ideation and psychotic symptoms  Pt takes vicodin and xanax and has been waundering around for days doing crack and maryjuana   She is somewhat confused and very anxious  She reports having auditory and visual hallucinations but is not on medications   She has moderate eps  Please see admission aims test for details  She denies taking antipsychotic medications and said she would not take any of them   She is poorly groomed and missing several teeth   Her medical problems include asthma, htn, history of cad and hyesterectomy  She also has a history of schizoaffective disorder   She is a poor historian and confused so it was difficult to obtain accurate information  She complains of chronic shoulder pain  Pt made poor eye contact but was cooperative and calm during the admission process A   Oriented to the unit  Provided nourishment  Verbal support given   Medications administered and effectiveness monitored   Q 15 min checks R   Pt adjusting well and resting at present time   Pt is safe

## 2012-04-27 NOTE — ED Provider Notes (Signed)
History     CSN: [id number]  Arrival date & time 04/27/12  1207   First MD Initiated Contact with Patient 04/27/12 1244      Chief Complaint  Patient presents with  . Medical Clearance    transported from [facility]    (Consider location/radiation/quality/duration/timing/severity/associated sxs/prior treatment) HPI  Past Medical History  Diagnosis Date  . Hypertension   . Asthma   . Depression   . Bipolar affective disorder, currently depressed, mild   . Stroke   . Coronary artery disease     Past Surgical History  Procedure Date  . Abdominal hysterectomy   . Cardiac catheterization     Family History  Problem Relation Age of Onset  . Hypertension Mother     History  Substance Use Topics  . Smoking status: Current Some Day Smoker    Types: Cigarettes  . Smokeless tobacco: Not on file  . Alcohol Use: Yes     occasionally    OB History    Grav Para Term Preterm Abortions TAB SAB Ect Mult Living                  Review of Systems  Allergies  Doxycycline; Fosinopril sodium; Penicillins; and Sulfonamide derivatives  Home Medications   Current Outpatient Rx  Name Route Sig Dispense Refill  . TRAZODONE HCL 50 MG PO TABS Oral Take 100 mg by mouth at bedtime.      BP 116/75  Pulse 87  Temp 98.2 F (36.8 C) (Oral)  Resp 18  SpO2 99%  Physical Exam  ED Course  Procedures (including critical care time)  Labs Reviewed  COMPREHENSIVE METABOLIC PANEL - Abnormal; Notable for the following:    Potassium 2.9 (*)     Glucose, Bld 100 (*)     GFR calc non Af Amer 71 (*)     GFR calc Af Amer 82 (*)     All other components within normal limits  URINE RAPID DRUG SCREEN (HOSP PERFORMED) - Abnormal; Notable for the following:    Cocaine POSITIVE (*)     Benzodiazepines POSITIVE (*)     All other components within normal limits  POCT I-STAT, CHEM 8 - Abnormal; Notable for the following:    Potassium 3.3 (*)     All other components within normal limits    ACETAMINOPHEN LEVEL  CBC  ETHANOL   No results found.   1. Depression   2. Polysubstance abuse       MDM  Admit to behavioral health        Donnetta Hutching, MD 04/27/12 [phone]

## 2012-04-27 NOTE — ED Notes (Signed)
Pt states she wants help with cocaine and marijuana abuse.  Denies HI, says she does not want to live.

## 2012-04-27 NOTE — ED Notes (Signed)
Patient discharge with hospital security and nurse tech. Patient discharge via ambulatory with a steady gait. Respirations equal and unlabored. Skin warm and dry. No acute distress noted.

## 2012-04-27 NOTE — ED Provider Notes (Signed)
History     CSN: [id number]  Arrival date & time 04/27/12  1207   First MD Initiated Contact with Patient 04/27/12 1244      Chief Complaint  Patient presents with  . Medical Clearance    transported from [facility]    (Consider location/radiation/quality/duration/timing/severity/associated sxs/prior treatment) Patient is a 61 y.o. female presenting with mental health disorder. The history is provided by the patient.  Mental Health Problem The primary symptoms include dysphoric mood.  Additional symptoms of the illness do not include no headaches.  Pt brought in from [facility]. Pt went there after she decided she needed help. States she is feeling "suicidal." State she has been off her medications for several weeks, states "I self medicate with crack cocaine and mariajuana." States she has also been "wondering around." States she has a plan to overdose on her medications or stab herself with a knife. Denies HI. States her tardive dyskinesia is "acting up" because she has been off her meds.   Past Medical History  Diagnosis Date  . Hypertension   . Asthma   . Depression   . Bipolar affective disorder, currently depressed, mild   . Stroke   . Coronary artery disease     Past Surgical History  Procedure Date  . Abdominal hysterectomy   . Cardiac catheterization     Family History  Problem Relation Age of Onset  . Hypertension Mother     History  Substance Use Topics  . Smoking status: Current Some Day Smoker    Types: Cigarettes  . Smokeless tobacco: Not on file  . Alcohol Use: Yes     occasionally    OB History    Grav Para Term Preterm Abortions TAB SAB Ect Mult Living                  Review of Systems  Constitutional: Negative for fever and chills.  HENT: Negative for neck pain.   Respiratory: Negative.   Cardiovascular: Negative.   Gastrointestinal: Negative.   Genitourinary: Negative for dysuria.  Skin: Negative.   Neurological: Negative for dizziness, weakness  and headaches.  Psychiatric/Behavioral: Positive for suicidal ideas, behavioral problems and dysphoric mood. The patient is nervous/anxious and is hyperactive.     Allergies  Doxycycline; Fosinopril sodium; Penicillins; and Sulfonamide derivatives  Home Medications   Current Outpatient Rx  Name Route Sig Dispense Refill  . ALPRAZOLAM 0.5 MG PO TABS Oral Take 0.5 mg by mouth 4 (four) times daily.    Marland Kitchen HYDROCODONE-ACETAMINOPHEN 7.5-500 MG PO TABS Oral Take 1 tablet by mouth every 6 (six) hours as needed.    . TRAZODONE HCL 50 MG PO TABS Oral Take 100 mg by mouth at bedtime.      BP 169/85  Pulse 89  Temp 98.5 F (36.9 C) (Oral)  Resp 20  SpO2 99%  Physical Exam  Nursing note and vitals reviewed. Constitutional: She is oriented to person, place, and time. She appears well-nourished.       tardive dyskinsia present.   Eyes: Conjunctivae are normal.  Neck: Neck supple.  Cardiovascular: Normal rate and regular rhythm.   Pulmonary/Chest: Effort normal and breath sounds normal. No respiratory distress. She has no wheezes. She has no rales.  Abdominal: Soft. Bowel sounds are normal. She exhibits no distension. There is no tenderness. There is no rebound.  Neurological: She is alert and oriented to person, place, and time.       Extrapyramidal movements of the face, hands, arms.  Skin: Skin is warm and dry.  Psychiatric:       Pressured speech    ED Course  Procedures (including critical care time)  Pt suicidal, frequent visits here for the same. Off her meds.   Results for orders placed during the hospital encounter of 04/27/12  ACETAMINOPHEN LEVEL      Component Value Range   Acetaminophen (Tylenol), Serum <15.0  10 - 30 ug/mL  CBC      Component Value Range   WBC 7.0  4.0 - 10.5 K/uL   RBC 4.55  3.87 - 5.11 MIL/uL   Hemoglobin 13.9  12.0 - 15.0 g/dL   HCT 16.1  09.6 - 04.5 %   MCV 90.3  78.0 - 100.0 fL   MCH 30.5  26.0 - 34.0 pg   MCHC 33.8  30.0 - 36.0 g/dL   RDW  40.9  81.1 - 91.4 %   Platelets 290  150 - 400 K/uL  COMPREHENSIVE METABOLIC PANEL      Component Value Range   Sodium 135  135 - 145 mEq/L   Potassium 2.9 (*) 3.5 - 5.1 mEq/L   Chloride 99  96 - 112 mEq/L   CO2 23  19 - 32 mEq/L   Glucose, Bld 100 (*) 70 - 99 mg/dL   BUN 15  6 - 23 mg/dL   Creatinine, Ser 7.82  0.50 - 1.10 mg/dL   Calcium 9.8  8.4 - 95.6 mg/dL   Total Protein 7.8  6.0 - 8.3 g/dL   Albumin 3.9  3.5 - 5.2 g/dL   AST 21  0 - 37 U/L   ALT 11  0 - 35 U/L   Alkaline Phosphatase 76  39 - 117 U/L   Total Bilirubin 0.3  0.3 - 1.2 mg/dL   GFR calc non Af Amer 71 (*) >90 mL/min   GFR calc Af Amer 82 (*) >90 mL/min  URINE RAPID DRUG SCREEN (HOSP PERFORMED)      Component Value Range   Opiates NONE DETECTED  NONE DETECTED   Cocaine POSITIVE (*) NONE DETECTED   Benzodiazepines POSITIVE (*) NONE DETECTED   Amphetamines NONE DETECTED  NONE DETECTED   Tetrahydrocannabinol NONE DETECTED  NONE DETECTED   Barbiturates NONE DETECTED  NONE DETECTED  ETHANOL      Component Value Range   Alcohol, Ethyl (B) <11  0 - 11 mg/dL    Potassium low 40mEq ordered here. Pt otherwise medically cleared.  2:55 PM Spoke with [facility]. Will assess.    1. Depression   2. Polysubstance abuse       MDM          Lottie Mussel, PA 04/28/12 1939

## 2012-04-27 NOTE — ED Notes (Signed)
Pt escorted from [facility]. Pt stated "I have been wandering around for 3 days, using crack cocaine and mariajuana" "I want to hurt myself with a knife"

## 2012-04-27 NOTE — Tx Team (Signed)
Initial Interdisciplinary Treatment Plan  PATIENT STRENGTHS: (choose at least two) General fund of knowledge Motivation for treatment/growth  PATIENT STRESSORS: Health problems Medication change or noncompliance Substance abuse   PROBLEM LIST: Problem List/Patient Goals Date to be addressed Date deferred Reason deferred Estimated date of resolution  Opiate and benzo addiction      depression      Psychotic symptoms      hypertension                                     DISCHARGE CRITERIA:  Ability to meet basic life and health [name] Adequate post-discharge living arrangements Improved stabilization in mood, thinking, and/or behavior Need for constant or close observation no longer present Verbal commitment to aftercare and medication compliance Withdrawal symptoms are absent or subacute and managed without 24-hour nursing intervention  PRELIMINARY DISCHARGE PLAN: Attend aftercare/continuing care group Attend 12-step recovery group Return to previous living arrangement  PATIENT/FAMIILY INVOLVEMENT: This treatment plan has been presented to and reviewed with the patient, Danielle [name], and/or family member,.  The patient and family have been given the opportunity to ask questions and make suggestions.  Danielle [name] 04/27/2012, 11:28 PM

## 2012-04-27 NOTE — ED Notes (Signed)
Patient placed in paper scrubs and checked by security. Patients belongings placed in 1 bag and checked by security

## 2012-04-28 DIAGNOSIS — F1210 Cannabis abuse, uncomplicated: Secondary | ICD-10-CM | POA: Diagnosis present

## 2012-04-28 DIAGNOSIS — F1410 Cocaine abuse, uncomplicated: Secondary | ICD-10-CM

## 2012-04-28 DIAGNOSIS — F200 Paranoid schizophrenia: Secondary | ICD-10-CM | POA: Diagnosis present

## 2012-04-28 DIAGNOSIS — F1994 Other psychoactive substance use, unspecified with psychoactive substance-induced mood disorder: Secondary | ICD-10-CM | POA: Diagnosis present

## 2012-04-28 LAB — URINALYSIS, ROUTINE W REFLEX MICROSCOPIC
Bilirubin Urine: NEGATIVE
Glucose, UA: NEGATIVE mg/dL
Hgb urine dipstick: NEGATIVE
Ketones, ur: NEGATIVE mg/dL
Leukocytes, UA: NEGATIVE
Nitrite: NEGATIVE
Protein, ur: NEGATIVE mg/dL
Specific Gravity, Urine: 1.022 (ref 1.005–1.030)
Urobilinogen, UA: 0.2 mg/dL (ref 0.0–1.0)
pH: 6 (ref 5.0–8.0)

## 2012-04-28 MED ORDER — LOPERAMIDE HCL 1 MG/5ML PO LIQD
2.0000 mg | ORAL | Status: DC | PRN
  Filled 2012-04-28: qty 10

## 2012-04-28 MED ORDER — CITALOPRAM HYDROBROMIDE 20 MG PO TABS
20.0000 mg | ORAL_TABLET | Freq: Every day | ORAL | Status: DC
  Administered 2012-04-28 – 2012-05-01 (×4): 20 mg via ORAL
  Filled 2012-04-28 (×7): qty 1

## 2012-04-28 MED ORDER — TRAZODONE HCL 100 MG PO TABS
100.0000 mg | ORAL_TABLET | Freq: Every evening | ORAL | Status: DC | PRN
  Administered 2012-04-28: 100 mg via ORAL
  Filled 2012-04-28: qty 3
  Filled 2012-04-28: qty 1

## 2012-04-28 MED ORDER — LOPERAMIDE HCL 2 MG PO CAPS: 2.0000 mg | ORAL_CAPSULE | ORAL | Status: DC | PRN

## 2012-04-28 MED ORDER — RISPERIDONE 2 MG PO TABS
2.0000 mg | ORAL_TABLET | Freq: Every day | ORAL | Status: DC
  Administered 2012-04-28 – 2012-04-30 (×3): 2 mg via ORAL
  Filled 2012-04-28 (×5): qty 1

## 2012-04-28 MED ORDER — GABAPENTIN 300 MG PO CAPS
300.0000 mg | ORAL_CAPSULE | ORAL | Status: DC
  Administered 2012-04-28 – 2012-05-01 (×9): 300 mg via ORAL
  Filled 2012-04-28 (×16): qty 1

## 2012-04-28 MED ORDER — POTASSIUM CHLORIDE CRYS ER 20 MEQ PO TBCR
20.0000 meq | EXTENDED_RELEASE_TABLET | ORAL | Status: AC
  Administered 2012-04-28 – 2012-04-30 (×6): 20 meq via ORAL
  Filled 2012-04-28 (×7): qty 1

## 2012-04-28 NOTE — Progress Notes (Signed)
Patient ID: Danielle [name], female   DOB: 05-Jan-1951, 61 y.o.   MRN: [id number]   D: Patient has a flat affect on approach this am. Does report increased depression lately. Gives depression and hopelessness "10" on scale when speaking with her this am but lowered them down to an "8" or "9" in treatment team when asked. Does report some withdrawal symptoms such as cravings, agitation, anxiety, tremors, and occasional nausea from cocaine abuse. Does report SI on and off but contracted for safety here at hospital. Did report that she slept well last night and says appetite good. Asking about her xanax but physician not wanting to start at this time. Did start on celexa and neurontin.  A: Staff will monitor and encourage group attendance. R: Taking meds without issue but patient drowsy and not feeling well enough to go to groups this morning.

## 2012-04-28 NOTE — Tx Team (Signed)
Interdisciplinary Treatment Plan Update (Adult)  Date:  04/28/2012  Time Reviewed:  10:15AM-11:15AM  Progress in Treatment: Attending groups:  No, new Participating in groups:    New Taking medication as prescribed:    New Tolerating medication:   N/A Family/Significant other contact made:  Not yet Patient understands diagnosis:   New, to be determined Discussing patient identified problems/goals with staff:   Yes, during treatment team Medical problems stabilized or resolved:   Pain in mouth Denies suicidal/homicidal ideation:  No, suicidal today Issues/concerns per patient self-inventory:   None Other:    New problem(s) identified: No, Describe:    Reason for Continuation of Hospitalization: Anxiety Depression Hallucinations Medication stabilization Suicidal ideation Other; describe has a tendency not to take her medications; hopelessness  Interventions implemented related to continuation of hospitalization:  Medication monitoring and adjustment, safety checks Q15 min., suicide risk assessment, group therapy, psychoeducation, collateral contact, aftercare planning, ongoing physician assessments, medication education  Additional comments:  10 teeth pulled 2 weeks ago, still having pain  Estimated length of stay:  5-6 days  Discharge Plan:  Follow up at [facility], return to rooming house  New goal(s):  Not applicable  Review of initial/current patient goals per problem list:   1.  Goal(s):  Medication stabilization  Met:  No  Target date:  By Discharge   As evidenced by:  New patient, still needed  2.  Goal(s):  Return sleep to normal level of 6+ hours nightly.  Met:  No  Target date:  By Discharge   As evidenced by:  New patient, still needed  3.  Goal(s):  Reduce auditory hallucinations (command type) to baseline.  Met:  No  Target date:  By Discharge   As evidenced by:  Voices present now, telling her to kill herself  4.  Goal(s):  Deny SI for 48 hours  prior to D/C.  Met:  No  Target date:  By Discharge   As evidenced by:  Positive for suicidal ideation now, plus AH telling her harm self  5.  Goal(s):  Determine if & how to address substance abuse issues at discharge.  Met:  No  Target date:  By Discharge   As evidenced by:  Admits to cocaine, marijuana, alcohol use  6.  Goal(s):  Patient would like help with getting someone to come to her house to oversee medications.  Met:  No  Target date:  By Discharge   As evidenced by:  New patient, still needed   Attendees: Patient:  Danielle [name]  04/28/2012 10:15AM-11:15AM  Family:     Physician:  Dr. Harvie Heck Readling 04/28/2012 10:15AM-11:15AM  Nursing:   Manuela Schwartz, RN 04/28/2012 10:15AM -11:15AM   Case Manager:  Ambrose Mantle, LCSW 04/28/2012 10:15AM-11:15AM  Counselor:  Veto Kemps, MT-BC 04/28/2012 10:15AM-11:15AM  Other:   Nestor Ramp, RN 04/28/2012 10:03 AM   Other:      Other:      Other:       Scribe for Treatment Team:   Sarina Ser, 04/28/2012, 10:15AM-11:15AM

## 2012-04-28 NOTE — [facility] Suicide Risk Assessment (Signed)
Suicide Risk Assessment  Admission Assessment     Demographic factors:  Assessment Details Time of Assessment: Admission Information Obtained From: Patient Current Mental Status:  Current Mental Status: Suicidal ideation indicated by patient;Self-harm thoughts Loss Factors:  Loss Factors: Decline in physical health;Financial problems / change in socioeconomic status Historical Factors:  Historical Factors: Family history of mental illness or substance abuse Risk Reduction Factors:  Risk Reduction Factors: Positive social support  CLINICAL FACTORS:   Severe Anxiety and/or Agitation Depression:   Anhedonia Comorbid alcohol abuse/dependence Hopelessness Insomnia Severe Alcohol/Substance Abuse/Dependencies Schizophrenia:   Command hallucinatons More than one psychiatric diagnosis Currently Psychotic Previous Psychiatric Diagnoses and Treatments Medical Diagnoses and Treatments/Surgeries  COGNITIVE FEATURES THAT CONTRIBUTE TO RISK:  None Noted.    Diagnosis:  Axis I:  Schizophrenia - Paranoid Type.   Substance Induced Mood Disorder.  Cannabis Abuse.  Cannabis Abuse.   The patient was seen and reports the following:   ADL's: Intact.  Sleep: The patient reports to sleeping well last night but was having significant difficulty sleeping prior to admission.  Appetite: The patient reports that her appetite is "ok".   Mild>(1-10) >Severe  Hopelessness (1-10): 9  Depression (1-10): 8  Anxiety (1-10): 8   Suicidal Ideation: The patient reports suicidal ideations today but with no plan or intent. Plan: No  Intent: No  Means: No   Homicidal Ideation: The patient denies any homicidal ideations.  Plan: No  Intent: No.  Means: No   General Appearance/Behavior: The patient was friendly and cooperative today with this provider.  Eye Contact: Good.  Speech: Appropriate in rate and volume today with no pressuring noted.  Motor Behavior: wnl.  Level of Consciousness: Alert and  Oriented x 3.  Mental Status: Alert and Oriented x 3.  Mood: Severe depression reported.  Affect: Appears flat today.  Anxiety Level: Severe anxiety reported.  Thought Process: The patient reports auditory hallucinations and paranoid delusions.  Thought Content: The patient reports auditory hallucinations and paranoid delusions. She denies any visual hallucinations. Perception: The patient reports auditory hallucinations and paranoid delusions.   Judgment: Fair to Good.  Insight: Fair to Good.  Cognition: Oriented to person, place and time.   Current Medications:    . citalopram  20 mg Oral Daily  . cloNIDine  0.1 mg Transdermal Weekly  . gabapentin  300 mg Oral [id number]  . lidocaine  1 patch Transdermal Q24H  . meloxicam  7.5 mg Oral Daily  . pantoprazole  20 mg Oral Q1200  . potassium chloride  20 mEq Oral BH-qamhs  . risperiDONE  2 mg Oral QHS  . DISCONTD: amitriptyline  25 mg Oral QHS,MR X 1  . DISCONTD: gabapentin  100 mg Oral QID   Review of Systems:  Neurological: The patient denies any headaches today. She denies any seizures or dizziness.  G.I.: The patient denies any constipation or G.I. Upset today.  Musculoskeletal: The patient denies any musculoskeletal issues today.   Time was spent today discussing with the patient her current symptoms.  The patient states that she was having difficulty sleeping prior to admission but states she slept well last night.  The patient reports an "ok" appetite.  The patient reports severe feelings of sadness, anhedonia and depressed mood.  She reports suicidal ideations but with no plan or intent and denies any visual hallucinations.  The patient reports severe anxiety and reports auditory hallucinations instructing her to harm herself.  She also states she feels others are possibly trying to harm  her.  She denies any visual hallucinations.    The patient also has been using cocaine and cannabis.  Treatment Plan Summary:  1. Daily  contact with patient to assess and evaluate symptoms and progress in treatment.  2. Medication management  3. The patient will deny suicidal ideations or homicidal ideations for 48 hours prior to discharge and have a depression and anxiety rating of 3 or less. The patient will also deny any auditory or visual hallucinations or delusional thinking.  4. The patient will deny any symptoms of substance withdrawal at time of discharge.   Plan:  1. Will start the patient on the medication Celexa at 20 mgs po q am for depression and anxiety. 2. Will start the patient on the medication Risperdal at 2 mg po qhs for psychosis. 3. Will start the medication KCl 20 mEq po qam and hs x 6 doses for hypokalemia. 4. Will start Trazodone 100 mgs po qhs - prn for sleep. 5. Will continue the patient on the non-psychiatric medications as listed above. 6. Laboratory studies reviewed.  7. Will order a UA, TSH, Free T3 and Free T4. 8. Will continue to monitor.   SUICIDE RISK:   Moderate:  Frequent suicidal ideation with limited intensity, and duration, some specificity in terms of plans, no associated intent, good self-control, limited dysphoria/symptomatology, some risk factors present, and identifiable protective factors, including available and accessible social support.   RANDY READLING 04/28/2012, 11:35 AM
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
## 2012-04-28 NOTE — Discharge Planning (Signed)
Met with patient in Treatment Team, as she did not feel well and declined attending Aftercare Planning Group.  Patient reports that she lives alone in a rooming house, and may be moving to a different rooming at her discharge.  Patient states that the person in charge of the rooming house is Olena Leatherwood, and this individual is assisting her with applying for Medicaid due to her medical needs and low income.  Patient asked if we can refer her for a Personal Care Aide to help in the home and to make sure that she takes her medication.  She did admit to alcohol, cocaine, and marijuana use.  Patient will follow up with [facility].  No case management needs today.  Per State Regulation 482.30  This chart was reviewed for medical necessity with respect to the patient's Admission/Duration of stay.   Next review due:  05/01/12   Ambrose Mantle, LCSW  04/28/2012  12:30 PM

## 2012-04-28 NOTE — Progress Notes (Signed)
[facility] Group Notes:  (Counselor/Nursing/MHT/Case Management/Adjunct)  04/28/2012 12:18 PM  Type of Therapy:  Group Therapy  Participation Level:  Did Not Attend     Danielle [name] 04/28/2012, 12:18 PM 

## 2012-04-28 NOTE — Progress Notes (Signed)
Date: 04/28/2012          Time: 0930      Group Topic/Focus: The focus of the group is on enhancing the patients' ability to cope with stressors by understanding what coping is, why it is important, the negative effects of stress and developing healthier coping skills. Patients practice [place] and discuss how exercise can be used as a healthy coping strategy.   Participation Level: Did not attend  Participation Quality: Not Applicable  Affect: Not Applicable  Cognitive: Not Applicable   Additional Comments: Patient in room despite encouragement to attend group.   Kyan Giannone 04/28/2012 12:01 PM

## 2012-04-28 NOTE — Progress Notes (Signed)
Psychoeducational Group Note  Date:  04/28/2012 Time:  1315  Group Topic/Focus:  Relapse Prevention Planning:   The focus of this group is to define relapse and discuss the need for planning to combat relapse.  Participation Level:  Did Not Attend  Participation Quality:  Did not attend  Affect:  Appropriate  Cognitive:  Appropriate  Insight:  Did not attend'  Engagement in Group:  Did not attend  Additional Comments:  Did not attend  Meredith Staggers 04/28/2012, 2:48 PM

## 2012-04-29 LAB — TSH: TSH: 2.188 u[IU]/mL (ref 0.350–4.500)

## 2012-04-29 LAB — T3, FREE: T3, Free: 3 pg/mL (ref 2.3–4.2)

## 2012-04-29 LAB — T4, FREE: Free T4: 1.12 ng/dL (ref 0.80–1.80)

## 2012-04-29 NOTE — Progress Notes (Signed)
Patient ID: Danielle [name], female   DOB: 1951-09-24, 61 y.o.   MRN: [id number]   [facility] Group Notes:  (Counselor/Nursing/MHT/Case Management/Adjunct)  04/29/2012 11 AM  Type of Therapy:  Aftercare Planning, Group Therapy, Dance/Movement Therapy   Participation Level:  Did Not Attend   [name] 04/29/2012. 11:26 AM

## 2012-04-29 NOTE — Progress Notes (Signed)
Patient ID: Danielle [name], female   DOB: 07-03-1951, 61 y.o.   MRN: [id number] [facility] MD Progress Note  04/29/2012 6:19 PM  Diagnosis:  Axis I: Schizophrenia, paranoid type                                Cocaine abuse  ADL's:  Intact  Sleep:  Yes,  AEB:  Appetite:  Suicidal Ideation:  No                Plan No                Intent No                     Means No         Homicidal Ideation:   No  Plan:  No  Intent:  No  Means:  No  Subjective:  Danielle [name] was in bed and sleepy. She states the medication is making her drowsy. She did not attend groups due to increased drowsyness. BP 126/85  Pulse 97  Temp 97.9 F (36.6 C) (Oral)  Resp 16  Ht 5\' 1"  (1.549 m) Objective: Pt. Is able to state no SI/HI, denies AH/VH.  She is requesting to continue after she has more sleep.   Mental Status: General Appearance  Behavior:  Disheveled Eye Contact:  Minimal Motor Behavior:  drowsy Speech:  slurred Level of Consciousness:  Drowsy Mood:  Depressed Affect:  sleepy Anxiety Level:  None Thought Process:  logical Thought Content:  WNL Perception:  Normal Judgment:  Fair Insight:  Present Cognition:   Sleep:  Number of Hours: 6.75   Vital Signs:Blood pressure 126/85, pulse 97, temperature 97.9 F (36.6 C), temperature source Oral, resp. rate 16, height 5\' 1"  (1.549 m).  Lab Results:  Results for orders placed during the hospital encounter of 04/27/12 (from the past 48 hour(s))  URINALYSIS, ROUTINE W REFLEX MICROSCOPIC     Status: Normal   Collection Time   04/28/12  4:09 PM      Component Value Range Comment   Color, Urine YELLOW  YELLOW    APPearance CLEAR  CLEAR    Specific Gravity, Urine 1.022  1.005 - 1.030    pH 6.0  5.0 - 8.0    Glucose, UA NEGATIVE  NEGATIVE mg/dL    Hgb urine dipstick NEGATIVE  NEGATIVE    Bilirubin Urine NEGATIVE  NEGATIVE    Ketones, ur NEGATIVE  NEGATIVE mg/dL    Protein, ur NEGATIVE  NEGATIVE mg/dL    Urobilinogen, UA 0.2  0.0 - 1.0 mg/dL      Nitrite NEGATIVE  NEGATIVE    Leukocytes, UA NEGATIVE  NEGATIVE MICROSCOPIC NOT DONE ON URINES WITH NEGATIVE PROTEIN, BLOOD, LEUKOCYTES, NITRITE, OR GLUCOSE <1000 mg/dL.  TSH     Status: Normal   Collection Time   04/28/12  7:33 PM      Component Value Range Comment   TSH 2.188  0.350 - 4.500 uIU/mL   T3, FREE     Status: Normal   Collection Time   04/28/12  7:33 PM      Component Value Range Comment   T3, Free 3.0  2.3 - 4.2 pg/mL   T4, FREE     Status: Normal   Collection Time   04/28/12  7:33 PM      Component Value Range Comment   Free T4 1.12  0.80 - 1.80  ng/dL     Physical Findings: AIMS: Facial and Oral Movements Muscles of Facial Expression: Mild Lips and Perioral Area: Mild Jaw: Mild Tongue: Mild,Extremity Movements Lower (legs, knees, ankles, toes): Minimal, Trunk Movements Neck, shoulders, hips: Mild, Overall Severity Severity of abnormal movements (highest score from questions above): Mild Incapacitation due to abnormal movements: Minimal Patient's awareness of abnormal movements (rate only patient's report): Aware, mild distress, Dental Status Current problems with teeth and/or dentures?: Yes Does patient usually wear dentures?: Yes  CIWA:  CIWA-Ar Total: 4  COWS:  COWS Total Score: 4   Treatment Plan Summary:   Plan: 1. Will continue medications as written at this time and allow the patient to rest today and adjust to the medication.  2. Labs and chart reviewed.  If patient continues to be sedated tomorrow, will decrease medication. Halaina Vanduzer 04/29/2012, 6:19 PM

## 2012-04-29 NOTE — ED Provider Notes (Signed)
Medical screening examination/treatment/procedure(s) were performed by non-physician practitioner and as supervising physician I was immediately available for consultation/collaboration.  Ethelda Chick, MD 04/29/12 1106

## 2012-04-29 NOTE — Progress Notes (Signed)
Psychoeducational Group Note  Date:  04/29/2012 Time:  2000  Group Topic/Focus:  Wrap-Up Group:   The focus of this group is to help patients review their daily goal of treatment and discuss progress on daily workbooks.  Participation Level:  Did Not Attend  Participation Quality:  Did not attend   Affect:  Did not attend   Cognitive:  Did not attend   Insight:  Did not attend   Engagement in Group:  Did not attend   Additional Comments:  Pt didn't attend group. Pt didn't feel good.   Martese Vanatta A 04/29/2012, 2:01 AM

## 2012-04-29 NOTE — Progress Notes (Signed)
D  Pt did not attend group but she does report feeling much better than when she first came   She also said she feels sleepy all the time and that's the reason she did not go to group   The facial movements she had when she first came in are less noticeable and she seems much less anxious   She denies voices and visual hallucinations at present and denies suicidal and homicidal ideation  A   Verbal support given  Medications administered and effectiveness monitored  Encourage group attendance and engagement in unit activities  Q 15 min checks R   Pt safe at present and continues to isolate

## 2012-04-29 NOTE — Progress Notes (Signed)
Psychoeducational Group Note  Date:  04/29/2012 Time:  0945 am  Group Topic/Focus:  Identifying Needs:   The focus of this group is to help patients identify their personal needs that have been historically problematic and identify healthy behaviors to address their needs.  Participation Level:  Did Not Attend   Andrena Mews 04/29/2012,11:13 AM

## 2012-04-30 MED ORDER — CLONIDINE HCL 0.2 MG PO TABS
0.2000 mg | ORAL_TABLET | Freq: Two times a day (BID) | ORAL | Status: DC
  Administered 2012-04-30 – 2012-05-08 (×16): 0.2 mg via ORAL
  Filled 2012-04-30: qty 2
  Filled 2012-04-30 (×2): qty 1
  Filled 2012-04-30: qty 6
  Filled 2012-04-30 (×5): qty 1
  Filled 2012-04-30 (×2): qty 6
  Filled 2012-04-30 (×8): qty 1
  Filled 2012-04-30: qty 6
  Filled 2012-04-30 (×3): qty 1

## 2012-04-30 MED ORDER — NICOTINE 14 MG/24HR TD PT24
14.0000 mg | MEDICATED_PATCH | Freq: Every day | TRANSDERMAL | Status: DC
  Filled 2012-04-30: qty 1

## 2012-04-30 NOTE — Progress Notes (Signed)
Found pt at start of shift sitting in the dayroom watching TV with peers. Her affect is blunted, depressed with congruent mood though she does deny SI/HI. She verbalizes concerns about her invol facial movements and states she has had what the doctors believe to be TD "for years." Asking that her xanax be restarted as she explains this helps those movements. Explained to pt that providers decided not to continue xanax. AIMS is scored at a "7" and upon observation, EPS is mild. Encouraged pt to speak with provider tomorrow to which she is agreeable. Pt attended group, med compliant, cooperative. Denies AVH. Lawrence Marseilles

## 2012-04-30 NOTE — Progress Notes (Signed)
Psychoeducational Group Note  Date:  04/30/2012 Time:  0945 am  Group Topic/Focus:  Making Healthy Choices:   The focus of this group is to help patients identify negative/unhealthy choices they were using prior to admission and identify positive/healthier coping strategies to replace them upon discharge.  Participation Level:  Did Not Attend    Erynne Kealey J 04/30/2012, 10:29 AM  

## 2012-04-30 NOTE — Progress Notes (Signed)
[facility] Adult Inpatient Family/Significant Other Suicide Prevention Education  Suicide Prevention Education:  Contact Attempts: [place] Kung (land lady/friend) [phone], (name of family member/significant other) has been identified by the patient as the family member/significant other with whom the patient will be residing, and identified as the person(s) who will aid the patient in the event of a mental health crisis.  With written consent from the patient, two attempts were made to provide suicide prevention education, prior to and/or following the patient's discharge.  We were unsuccessful in providing suicide prevention education.  A suicide education pamphlet was given to the patient to share with family/significant other.  Date and time of first attempt: 04/30/12 3:30 PM Date and time of second attempt: [facility] 04/30/2012, 5:03 PM

## 2012-04-30 NOTE — Progress Notes (Signed)
Patient ID: Danielle [name], female   DOB: 04/25/1951, 61 y.o.   MRN: [id number] Patient ID: Danielle [name], female   DOB: 1951-10-15, 61 y.o.   MRN: [id number] [facility] MD Progress Note  04/30/2012 1:51 PM  Diagnosis:  Axis I: Schizophrenia, paranoid type                                Cocaine abuse  ADL's:  Intact  Sleep:  Yes,  AEB:  Appetite:  Suicidal Ideation:  No                Plan No                Intent No                     Means No         Homicidal Ideation:   No  Plan:  No  Intent:  No  Means:  No  Subjective: Met with patient and the chart is reviewed.  She is up ambulating today in the hall way. She has not attended groups yet due to her drowsiness. Staff notes indicate that she is requesting Xanax for her TD, but she does not request this of me.  [name] notes that she is sleeping "good" and that her appetite is "good."  She rates her depression as a 7/10, and her anxiety a 8/10.  She also notes she has feelings of hopelessness at 7/10.  [name] reports she gets a little dizzy upon standing up, but it resolves quickly and hs not been a problem.    She also asks if she can get some reading glasses as she has trouble filling out the daily self inventory because she can't see well.  eBP 164/98  Pulse 104  Temp 98.1 F (36.7 C) (Oral)  Resp 20  Ht 5\' 1"  (1.549 m)  Objective: Her speech is soft, but clear and goal directed.  She denies AH/VH. States she hs no SI/HI.  She does report some mild anxiety upon getting up in the morning but denies any other complaints.   Mental Status: alert General Appearance  Behavior:  Disheveled Eye Contact:  fair Motor Behavior:  Normal no evidence of TD. Speech:  clear Mood:  Depressed Affect:  congruent Anxiety Level:  Significant to moderate Thought Process:  logical Thought Content:  WNL Perception:  Normal Judgment:  Fair Insight:  Present Cognition:  At least average Sleep:  Number of Hours: 6.75   Vital Signs:Blood  pressure 164/98, pulse 104, temperature 98.1 F (36.7 C), temperature source Oral, resp. rate 20, height 5\' 1"  (1.549 m).  Lab Results:  Results for orders placed during the hospital encounter of 04/27/12 (from the past 48 hour(s))  URINALYSIS, ROUTINE W REFLEX MICROSCOPIC     Status: Normal   Collection Time   04/28/12  4:09 PM      Component Value Range Comment   Color, Urine YELLOW  YELLOW    APPearance CLEAR  CLEAR    Specific Gravity, Urine 1.022  1.005 - 1.030    pH 6.0  5.0 - 8.0    Glucose, UA NEGATIVE  NEGATIVE mg/dL    Hgb urine dipstick NEGATIVE  NEGATIVE    Bilirubin Urine NEGATIVE  NEGATIVE    Ketones, ur NEGATIVE  NEGATIVE mg/dL    Protein, ur NEGATIVE  NEGATIVE mg/dL    Urobilinogen, UA 0.2  0.0 -  1.0 mg/dL    Nitrite NEGATIVE  NEGATIVE    Leukocytes, UA NEGATIVE  NEGATIVE MICROSCOPIC NOT DONE ON URINES WITH NEGATIVE PROTEIN, BLOOD, LEUKOCYTES, NITRITE, OR GLUCOSE <1000 mg/dL.  TSH     Status: Normal   Collection Time   04/28/12  7:33 PM      Component Value Range Comment   TSH 2.188  0.350 - 4.500 uIU/mL   T3, FREE     Status: Normal   Collection Time   04/28/12  7:33 PM      Component Value Range Comment   T3, Free 3.0  2.3 - 4.2 pg/mL   T4, FREE     Status: Normal   Collection Time   04/28/12  7:33 PM      Component Value Range Comment   Free T4 1.12  0.80 - 1.80 ng/dL     Physical Findings: AIMS: Facial and Oral Movements Muscles of Facial Expression: Minimal Lips and Perioral Area: Minimal Jaw: None, normal Tongue: None, normal,Extremity Movements   NO evidence of TD noted. Upper (arms, wrists, hands, fingers): None, normal Lower (legs, knees, ankles, toes): None, normal, Trunk Movements Neck, shoulders, hips: None, normal, Overall Severity Severity of abnormal movements (highest score from questions above): Minimal Incapacitation due to abnormal movements: None, normal Patient's awareness of abnormal movements (rate only patient's report): Aware, no  distress, Dental Status Current problems with teeth and/or dentures?: No Does patient usually wear dentures?: No  CIWA:  CIWA-Ar Total: 4  COWS:  COWS Total Score: 4   Treatment Plan Summary:   Plan: 1. VS reviewed since admission. Chart notes reviewed from prior admissions and the patient is not on enough clonidine at 1mg .  I am discontinuing the patch and changing her over to PO 2mg  BID for her elevated blood pressures.  Will consider adding Triamtere/HCTZ 75/25mg  as well. 2. Labs and chart reviewed.  If patient continues to be sedated tomorrow, will decrease medication.\\ 3, No change in medications at this time.  Patient is adjusting to restart well. 4. She is encouraged to go to all groups.  Erian Lariviere 04/30/2012, 1:51 PM

## 2012-04-30 NOTE — Progress Notes (Signed)
D   Pt has isolated to her room most of the morning   She did not attend group   Pt requested that her xanax be given back to her for her eps/tardive dysknesia   Pt seems to have it worse when she is asking for medications versus when she is just walking the hall or at meals  clonodine patch was removed per request of pa and pt will start po clonidine  A   Verbal support given  Medications administered and effectiveness monitored   Removed clonidine patch at 1430   Q 15 min checks R   Pt safe at present

## 2012-04-30 NOTE — Progress Notes (Signed)
Patient ID: Danielle [name], female   DOB: March 24, 1951, 61 y.o.   MRN: [id number]   [facility] Group Notes:  (Counselor/Nursing/MHT/Case Management/Adjunct)  04/30/2012 11 AM  Type of Therapy:  Aftercare Planning, Group Therapy, Dance/Movement Therapy   Participation Level:  Did Not Attend   [name] 04/30/2012. 11:41 AM

## 2012-04-30 NOTE — Progress Notes (Signed)
Met with pt 1:1 tonight. She reports having a better day today. Friend visited and brought her an outfit which she proudly shows to this Clinical research associate.  Pt smiling, more engaged and less anxious than this time last night. Medicated at hs per orders. Pt given support and encouragement. Denies pain or physical problems, no SI/HI/AVH. Lawrence Marse[place]

## 2012-04-30 NOTE — Progress Notes (Signed)
Psychoeducational Group Note  Date:  04/30/2012 Time: 1515  Group Topic/Focus:  Conflict Resolution:   The focus of this group is to discuss the conflict resolution process and how it may be used upon discharge.  Participation Level:  Active  Participation Quality:  Attentive  Affect:  Appropriate  Cognitive:  Alert  Insight:  Good  Engagement in Group:  Good  Additional Comments:  Was well behaved  Danielle [name] Celcia 04/30/2012, 5:41 PM

## 2012-05-01 LAB — COMPREHENSIVE METABOLIC PANEL
ALT: 9 U/L (ref 0–35)
AST: 16 U/L (ref 0–37)
Albumin: 3.3 g/dL — ABNORMAL LOW (ref 3.5–5.2)
Alkaline Phosphatase: 75 U/L (ref 39–117)
BUN: 21 mg/dL (ref 6–23)
CO2: 24 mEq/L (ref 19–32)
Calcium: 9.7 mg/dL (ref 8.4–10.5)
Chloride: 100 mEq/L (ref 96–112)
Creatinine, Ser: 1.13 mg/dL — ABNORMAL HIGH (ref 0.50–1.10)
GFR calc Af Amer: 60 mL/min — ABNORMAL LOW (ref >90)
GFR calc non Af Amer: 52 mL/min — ABNORMAL LOW (ref >90)
Glucose, Bld: 103 mg/dL — ABNORMAL HIGH (ref 70–99)
Potassium: 4.2 mEq/L (ref 3.5–5.1)
Sodium: 135 mEq/L (ref 135–145)
Total Bilirubin: 0.1 mg/dL — ABNORMAL LOW (ref 0.3–1.2)
Total Protein: 7 g/dL (ref 6.0–8.3)

## 2012-05-01 MED ORDER — GABAPENTIN 400 MG PO CAPS
400.0000 mg | ORAL_CAPSULE | ORAL | Status: DC
  Administered 2012-05-01 – 2012-05-03 (×6): 400 mg via ORAL
  Filled 2012-05-01 (×13): qty 1

## 2012-05-01 MED ORDER — RISPERIDONE 2 MG PO TABS
4.0000 mg | ORAL_TABLET | Freq: Every day | ORAL | Status: DC
  Administered 2012-05-01 – 2012-05-07 (×7): 4 mg via ORAL
  Filled 2012-05-01 (×2): qty 2
  Filled 2012-05-01: qty 6
  Filled 2012-05-01 (×5): qty 2
  Filled 2012-05-01: qty 6
  Filled 2012-05-01 (×2): qty 2

## 2012-05-01 MED ORDER — CITALOPRAM HYDROBROMIDE 40 MG PO TABS
40.0000 mg | ORAL_TABLET | Freq: Every day | ORAL | Status: DC
  Administered 2012-05-02 – 2012-05-08 (×7): 40 mg via ORAL
  Filled 2012-05-01 (×3): qty 1
  Filled 2012-05-01: qty 3
  Filled 2012-05-01 (×5): qty 1
  Filled 2012-05-01: qty 3
  Filled 2012-05-01: qty 1

## 2012-05-01 NOTE — Tx Team (Addendum)
Interdisciplinary Treatment Plan Update (Adult)  Date:  05/01/2012  Time Reviewed:  10:15AM-11:15AM  Progress in Treatment: Attending groups:  Yes Participating in groups:    Yes Taking medication as prescribed:    Yes Tolerating medication:   Yes Family/Significant other contact made:  Yes, with landlady Patient understands diagnosis:   Yes, limited insight, poor judgment Discussing patient identified problems/goals with staff:   Yes Medical problems stabilized or resolved:   Yes Denies suicidal/homicidal ideation:  Yes Issues/concerns per patient self-inventory:   Concerned about her sleep medication being too strong, as she normally gets up to go to the bathroom several times a night, and could not awaken.  Also she drooled on herself.  Also complains about her roommate making noise urinating on the bathroom floor, and not being able to clean it up. Other:    New problem(s) identified: Yes, Describe:  needs different roommate  Reason for Continuation of Hospitalization: Anxiety Depression Medication stabilization Other; describe Is asking for someone to help with her medications, but is not eligible for Personal Care Aide  Interventions implemented related to continuation of hospitalization:  Medication monitoring and adjustment, safety checks Q15 min., suicide risk assessment, group therapy, psychoeducation, collateral contact, aftercare planning, ongoing physician assessments, medication education  Additional comments:  Not applicable  Estimated length of stay:  2-4 days  Discharge Plan:  Return to a different rooming house run by same landlady, follow up with [facility]  New goal(s):  Return anxiety to no greater than 3 at discharge.  Review of initial/current patient goals per problem list:   1.  Goal(s):  Medication stabilization  Met:  No  Target date:  By Discharge   As evidenced by:  Nighttime meds too sedating  2.  Goal(s):  Return sleep to normal level of 6+  hours nightly.  Met:  Yes  Target date:  By Discharge   As evidenced by:  But not able to wake up as she desires  3.  Goal(s):  Reduce auditory hallucinations (command type) to baseline.  Met:  Yes  Target date:  By Discharge   As evidenced by:  States that voices are gone  4.  Goal(s):  Deny SI for 48 hours prior to D/C.  Met:  Yes  Target date:  By Discharge   As evidenced by:  Denies  5. Goal(s): Determine if & how to address substance abuse issues at discharge.  Met: No  Target date: By Discharge  As evidenced by: Admits to cocaine, marijuana, alcohol use, but if/how to handle at discharge is not yet determined.  6. Goal(s): Patient would like help with getting someone to come to her house to oversee medications.  Met: No  Target date: By Discharge  As evidenced by: Research has determined that patient is not eligible for Personal Care Aide (has only Medicare)  Attendees: Patient:  Danielle [name]  05/01/2012 10:15AM-11:15AM  Family:     Physician:  Dr. Harvie Heck Readling 05/01/2012 10:15AM-11:15AM  Nursing:   Joslyn Devon, RN 05/01/2012 10:15AM -11:15AM   Case Manager:  Ambrose Mantle, LCSW 05/01/2012 10:15AM-11:15AM  Counselor:  Marni Griffon, LCAS 05/01/2012 10:15AM-11:15AM  Other:   Tacy Learn, RN 05/01/2012   Other:      Other:      Other:       Scribe for Treatment Team:   Sarina Ser, 05/01/2012, 10:15AM-11:15AM

## 2012-05-01 NOTE — Progress Notes (Signed)
[facility] Group Notes:  (Counselor/Nursing/MHT/Case Management/Adjunct)  05/01/2012 2:15 PM  Type of Therapy: Group Therapy   Participation Level:  Did not attend     Danielle [name] C 05/01/2012, 2:15 PM   

## 2012-05-01 NOTE — Progress Notes (Signed)
Psychoeducational Group Note  Date:  05/01/2012 Time:  2030  Group Topic/Focus:  Wrap-Up Group:   The focus of this group is to help patients review their daily goal of treatment and discuss progress on daily workbooks.  Participation Level:  Did Not Attend  Additional Comments:  Did not attend  Dalia Heading 05/01/2012, 11:28 PM

## 2012-05-01 NOTE — Progress Notes (Signed)
Patient has been up in the milieu interacting with her peers.  She continues to voice her concerns over her medication.  She states that she continues to have anxiety.  She rates her depression and hopelessness as a 7; her anxiety as an 8.  No symptoms of EPS or tardive dyskensia is noted today.  She has requested reading glasses from staff, which we attempt to provide for her.  She denies any SI/HI; she endorses some AH while she is in her room.  She is complaining of some pain on her left upper side.  She was given tylenol with no relief.  Offered hot pack and patient is receptive to that.  Continue to monitor medication management and MD orders.  Maintain 15 min safety checks.    Patient interacts well with staff and peers. She is receptive to treatment.

## 2012-05-01 NOTE — Progress Notes (Signed)
D: Has been generally isolative to room. Appears blunted and depressed. Calm and cooperative with assessment. Somewhat brief with assessment questions however. States she has had a frustrating day r/t not being able to get Xanax for her anxiety. States she took it before and she can get it again from another MD. Denies SI/HI/AVH and contracts for safety.  A: Support and encouragement provided. Safety has been maintained with Q15 minute observation. Encouraged to continue to advocate for the meds she feels she needs, but allow our MD to try his POC and see if it is effective. Medications given as ordered by MD.    R: Pt remains safe. Is compliant with treatment goals and medications. Offers no additional concerns. Will continue Q15 minute observation and continue current POC.

## 2012-05-01 NOTE — Progress Notes (Signed)
Psychoeducational Group Note  Date:  05/01/2012 Time:  11.00  Group Topic/Focus:  Self Care:   The focus of this group is to help patients understand the importance of self-care in order to improve or restore emotional, physical, spiritual, interpersonal, and financial health.  Participation Level:  Active  Participation Quality:  Appropriate and Attentive  Affect:  Appropriate  Cognitive:  Appropriate and Oriented  Insight:  Good  Engagement in Group:  Good  Additional Comments:  Pt participated and processed in group  Brezlyn Manrique 05/01/2012, 6:00 PM

## 2012-05-01 NOTE — Progress Notes (Signed)
[facility] Adult [facility]patient Family/Significant Other Suicide Prevention Education  Suicide Prevention Education:  Education Completed; Danielle [name]) Danielle [name] [phone], landlord of Pt., has been identified by the patient as the family member/significant other with whom the patient will be residing, and identified as the person(s) who will aid the patient in the event of a mental health crisis (suicidal ideations/suicide attempt).  With written consent from the patient, the family member/significant other has been provided the following suicide prevention education, prior to the and/or following the discharge of the patient.  The suicide prevention education provided includes the following:  Suicide risk factors  Suicide prevention and interventions  National Suicide Hotline telephone number  [facility] assessment telephone number  [facility] Emergency Assistance 911  [facility] and/or Residential Mobile Crisis Unit telephone number  Request made of family/significant other to:  Remove weapons (e.g., guns, rifles, knives), all items previously/currently identified as safety concern.    Remove drugs/medications (over-the-counter, prescriptions, illicit drugs), all items previously/currently identified as a safety concern.  The family member/significant other verbalizes understanding of the suicide prevention education information provided.  The family member/significant other agrees to remove the items of safety concern listed above.  Concerns:  Ms. Danielle [name] stated that she should have the new apartment ready for Pt by Friday.  She voiced concern that Pt would not be in close proximity to her and she was concerned about Pt's adherence to medications.  She stated that Pt had been known to sell her pills.  Therapist informed her that we were investigating the possibility of having someone come to the home to administer Pt's meds.    Danielle [name] 05/01/2012, 12:31 PM

## 2012-05-01 NOTE — Discharge Planning (Signed)
Met with patient in Aftercare Planning Group and provided today's workbook based on theme of the day. Patient expressed concern that she was not being heard by doctors over the weekend.  She said whatever she is taking for the anxiety is not helping (anxiety rated at 7), and the sleep medication is too strong.  She complained of not waking up as usual to use the restroom at night, and also having considerable drooling.  Case Manager called [facility] to find out about how to refer for Personal Care Aide.  They do not provide that service; however, they reminded Case Manager that Medicare will not pay for this service anyway.  Per State Regulation 482.30  This chart was reviewed for medical necessity with respect to the patient's Admission/Duration of stay.   Next review due:  05/04/12   Ambrose Mantle, LCSW  05/01/2012  4:25 PM

## 2012-05-01 NOTE — Progress Notes (Signed)
[facility] Group Notes:  (Counselor/Nursing/MHT/Case Management/Adjunct)  05/01/2012 1:56 AM  Type of Therapy:  wrap-up  Participation Level:  Active  Participation Quality:  Appropriate  Affect:  Appropriate  Cognitive:  Appropriate  Insight:  Good  Engagement in Group:  Good  Engagement in Therapy:  Good  Modes of Intervention:  Education  Summary of Progress/Problems:Patient attended and participated in wrap-up group today. She reported that she had a good day. Patient reports that she believe her medication needs adjusting. The topic of the day was support systems. The patient reports that her support system was a friend of her's. He pick her up and take her wherever she wants to go.    Lita Mains [facility] 05/01/2012, 1:56 AM

## 2012-05-01 NOTE — Progress Notes (Signed)
[facility] MD Progress Note  05/01/2012 5:45 PM  Diagnosis:  Axis I: Schizophrenia - Paranoid Type.  Substance Induced Mood Disorder.  Cocaine Abuse. Cannabis Abuse.   The patient was seen and reports the following:   ADL's: Intact.  Sleep: The patient reports to sleeping well last night but awoke with "cotton mouth."  Appetite: The patient reports that her appetite is good.   Mild>(1-10) >Severe  Hopelessness (1-10): 0 Depression (1-10): 7  Anxiety (1-10): 7  Suicidal Ideation: The patient denies any suicidal ideations today.  Plan: No  Intent: No  Means: No   Homicidal Ideation: The patient denies any homicidal ideations.  Plan: No  Intent: No.  Means: No   General Appearance/Behavior: The patient remains friendly and cooperative today with this provider.  Eye Contact: Good.  Speech: Appropriate in rate and volume today with no pressuring noted.  Motor Behavior: wnl.  Level of Consciousness: Alert and Oriented x 3.  Mental Status: Alert and Oriented x 3.  Mood: Moderate depression reported.  Affect: Appears moderately constricted today.  Anxiety Level: Moderate anxiety reported.  Thought Process: The patient reports auditory hallucinations today which occur when she is alone..  Thought Content: The patient reports auditory hallucinations today.  She denies any visual hallucinations or delusional thinking.  Perception: The patient reports auditory hallucinations.  Judgment: Fair to Good.  Insight: Fair to Good.  Cognition: Oriented to person, place and time.  Sleep:  Number of Hours: 6.75    Vital Signs:Blood pressure 131/87, pulse 94, temperature 97.7 F (36.5 C), temperature source Oral, resp. rate 20, height 5\' 1"  (1.549 m).  Current Medications: Current Facility-Administered Medications  Medication Dose Route Frequency Provider Last Rate Last Dose  . acetaminophen (TYLENOL) tablet 650 mg  650 mg Oral Q6H PRN Verne Spurr, PA-C   650 mg at 05/01/12 1537  . alum &  mag hydroxide-simeth (MAALOX/MYLANTA) 200-200-20 MG/5ML suspension 30 mL  30 mL Oral Q4H PRN Verne Spurr, PA-C      . citalopram (CELEXA) tablet 40 mg  40 mg Oral Daily Sabreen Kitchen D Angellee Cohill, MD      . cloNIDine (CATAPRES) tablet 0.2 mg  0.2 mg Oral BID Verne Spurr, PA-C   0.2 mg at 05/01/12 1716  . gabapentin (NEURONTIN) capsule 400 mg  400 mg Oral [id number] Delylah Stanczyk D Joeanna Howdyshell, MD      . lidocaine (LIDODERM) 5 % 1 patch  1 patch Transdermal Q24H Mike Craze, MD   1 patch at 04/30/12 2154  . loperamide (IMODIUM) capsule 2 mg  2 mg Oral PRN Alyson Kuroski-Mazzei, DO      . magnesium hydroxide (MILK OF MAGNESIA) suspension 30 mL  30 mL Oral Daily PRN Verne Spurr, PA-C      . meloxicam (MOBIC) tablet 7.5 mg  7.5 mg Oral Daily Mike Craze, MD   7.5 mg at 05/01/12 0827  . pantoprazole (PROTONIX) EC tablet 20 mg  20 mg Oral Q1200 Mike Craze, MD   20 mg at 05/01/12 1200  . potassium chloride SA (K-DUR,KLOR-CON) CR tablet 20 mEq  20 mEq Oral BH-qamhs Curlene Labrum Kyrstin Campillo, MD   20 mEq at 04/30/12 2154  . risperiDONE (RISPERDAL) tablet 4 mg  4 mg Oral QHS Curlene Labrum Dimitry Holsworth, MD      . traZODone (DESYREL) tablet 100 mg  100 mg Oral QHS PRN Curlene Labrum Ronie Barnhart, MD   100 mg at 04/28/12 2155  . DISCONTD: citalopram (CELEXA) tablet 20 mg  20 mg Oral Daily  Curlene Labrum Jayda White, MD   20 mg at 05/01/12 0826  . DISCONTD: gabapentin (NEURONTIN) capsule 300 mg  300 mg Oral BH-q8a2phs Curlene Labrum Shirely Toren, MD   300 mg at 05/01/12 1716  . DISCONTD: risperiDONE (RISPERDAL) tablet 2 mg  2 mg Oral QHS Curlene Labrum Renell Allum, MD   2 mg at 04/30/12 2154   Lab Results: No results found for this or any previous visit (from the past 48 hour(s)).  Physical Findings: AIMS: Facial and Oral Movements Muscles of Facial Expression: Minimal Lips and Perioral Area: Minimal Jaw: None, normal Tongue: None, normal,Extremity Movements Upper (arms, wrists, hands, fingers): None, normal Lower (legs, knees, ankles, toes): None, normal, Trunk  Movements Neck, shoulders, hips: None, normal, Overall Severity Severity of abnormal movements (highest score from questions above): Minimal Incapacitation due to abnormal movements: None, normal Patient's awareness of abnormal movements (rate only patient's report): Aware, no distress, Dental Status Current problems with teeth and/or dentures?: No Does patient usually wear dentures?: No  CIWA:  CIWA-Ar Total: 4  COWS:  COWS Total Score: 4   Review of Systems:  Neurological: The patient denies any headaches today. She denies any seizures or dizziness.  G.I.: The patient denies any constipation or G.I. Upset today.  Musculoskeletal: The patient denies any musculoskeletal issues today.   Time was spent today discussing with the patient her current symptoms. The patient states that she is sleeping well and reports a good appetite. The patient reports moderate feelings of sadness, anhedonia and depressed mood. She denies any suicidal or homicidal ideations today.  She denies any visual hallucinations or delusional thinking but reports auditory hallucinations when she is alone. The patient reports moderate anxiety today.  Treatment Plan Summary:  1. Daily contact with patient to assess and evaluate symptoms and progress in treatment.  2. Medication management  3. The patient will deny suicidal ideations or homicidal ideations for 48 hours prior to discharge and have a depression and anxiety rating of 3 or less. The patient will also deny any auditory or visual hallucinations or delusional thinking.  4. The patient will deny any symptoms of substance withdrawal at time of discharge.   Plan:  1. Will increase the medication Celexa to 40 mgs po q am for depression and anxiety.  2. Will increase the medication Risperdal to 4 mg po qhs for psychosis.  3. Will start the medication Neurontin at 300 mgs po q am, 2 pm and hs for pain and anxiety. 4. Will continue Trazodone at 100 mgs po qhs - prn for  sleep.  5. Will continue the patient on the non-psychiatric medications as listed above.  6. Laboratory studies reviewed.  7. Will continue to monitor.   Zaray Gatchel 05/01/2012, 5:45 PM

## 2012-05-02 NOTE — Tx Team (Signed)
Interdisciplinary Treatment Plan Update (Adult)  Date: 05/02/2012  Time Reviewed: 10:15AM-11:15AM  Progress in Treatment:  Attending groups: Yes  Participating in groups: Yes  Taking medication as prescribed: Yes  Tolerating medication: Yes  Family/Significant other contact made: Yes, with landlady  Patient understands diagnosis: Yes, limited insight, fair judgment  Discussing patient identified problems/goals with staff: Yes  Medical problems stabilized or resolved: Yes  Denies suicidal/homicidal ideation: Yes  Issues/concerns per patient self-inventory: [place] Spaniel it was hard to wake up this morning  Other:   New problem(s) identified: No  Reason for Continuation of Hospitalization:  Anxiety  Depression  Medication stabilization  Other; describe needs to develop techniques to remember her meds   Interventions implemented related to continuation of hospitalization: Medication monitoring and adjustment, safety checks Q15 min., suicide risk assessment, group therapy, psychoeducation, collateral contact, aftercare planning, ongoing physician assessments, medication education   Additional comments: Not applicable   Estimated length of stay: 1-3 days   Discharge Plan: Return to a different rooming house run by same landlady, follow up with [facility] (Case Manager will ask [facility] if they can help to set up her pill box daily)  New goal(s): N/A   Review of initial/current patient goals per problem list:  1. Goal(s): Medication stabilization  Met: No  Target date: By Discharge  As evidenced by: Ongoing  2. Goal(s): Return sleep to normal level of 6+ hours nightly.  Met: Yes  Target date: By Discharge  As evidenced by: But not able to wake up as she desires  3. Goal(s): Reduce auditory hallucinations (command type) to baseline.  Met: Yes  Target date: By Discharge  As evidenced by: States that voices are gone  4. Goal(s): Deny SI for 48 hours prior to D/C.  Met: Yes  Target date:  By Discharge  As evidenced by: Denies  5. Goal(s): Determine if & how to address substance abuse issues at discharge.  Met: No  Target date: By Discharge  As evidenced by: Admits to cocaine, marijuana, alcohol use, but if/how to handle at discharge is not yet determined.   6. Goal(s): Patient would like help with getting someone to come to her house to oversee medications.  Met: No  Target date: By Discharge  As evidenced by: Research has determined that patient is not eligible for Personal Care Aide (has only Medicare), Case Manager will talk to [facility] about possibly helping her with pill box weekly.  She is willing to go to [facility] once a week for this.  7. Goal(s): Return anxiety to no greater than 3 at discharge. Met: No  Target date: By Discharge  As evidenced by:  "5" today  Attendees:  Patient: Danielle [name]  05/02/2012 10:15AM-11:15AM   Family:    Physician: Dr. Harvie Heck Readling  05/02/2012 10:15AM-11:15AM   Nursing: Joslyn Devon, RN  05/02/2012 10:15AM -11:15AM   Case Manager: Ambrose Mantle, LCSW  05/02/2012 10:15AM-11:15AM   Counselor: Marni Griffon, LCAS  05/02/2012 10:15AM-11:15AM   Other:     Other:    Other:    Other:    Scribe for Treatment Team:  Sarina Ser, 05/02/2012, 10:15AM-11:15AM

## 2012-05-02 NOTE — Progress Notes (Signed)
D: In hallway on approach. Appears a little brighter and well groomed (wearing wig). Calm and cooperative with assessment. No acute distress noted. States she has had a good day. States she was able to get additional sleep today, got a call from a good friend and has eaten well. She feels like these 3 factors have improved her mood . This morning she rated her depression and hopelessness a "6" on scale of 1-10, 10, being acutely depressed and hopeless. She now rates it as a "5". Offers no questions or concerns. Denies SI/HI/AVH and contracts for safety.   A: Safety has been maintained with Q15 minute observation. Support and encouragement offered. Medications given as ordered by MD. There was an order to remove Clonidine patch tonight but pt states she was switched to pills earlier in the week and they removed it at that time. We checked both arms and did not find it.   R: Pt remains safe. Is compliant with treatment plan and  is compliant with medications. Will continue current POC and continue Q15 minute observation.

## 2012-05-02 NOTE — Progress Notes (Signed)
Psychoeducational Group Note  Date:  05/02/2012 Time:  1100  Group Topic/Focus:  Recovery Goals:   The focus of this group is to identify appropriate goals for recovery and establish a plan to achieve them.  Participation Level:  Active  Participation Quality:  Attentive  Affect:  Appropriate  Cognitive:  Alert  Insight:  Good  Engagement in Group:  Good  Additional Comments:  Pt attended group this morning and participated well.  Ayeshia Coppin E 05/02/2012, 5:28 PM

## 2012-05-02 NOTE — Progress Notes (Signed)
[facility] Group Notes:  (Counselor/Nursing/MHT/Case Management/Adjunct)  05/02/2012  10:30  AM  Type of Therapy: Group Therapy   Participation Level: Minimal   Participation Quality: Limited  Affect: Blunted  Cognitive: oriented, alert   Insight: Fair  Engagement in Group: Limited  Modes of Intervention: Clarification, Education, Problem-solving, Socialization, Activity, Encouragement and Support   Summary of Progress/Problems: Pt participated in group by listening attentively and self disclosing.  Pt disclosed to the group for the first time that she had been on different drugs since she was 61 years old.  She reported she had been homeless for 7 years and had only recently been able to have live in an apt.  She reported she continues to have difficulty adjusting to being inside and feels uncomfortable around a lot of people. Therapist introduced the topic of Recovery and explained how recovery applied to mental as well as physical health.  Therapist explained that development of good coping strategies helped enhance recovery.  Therapist prompted patients to identify how they feel personally when they are beginning to feel angry, anxious, sad, depressed, or wanting to use alcohol or drugs.   Therapist prompted patients to verbalize examples of situations, people, places, or things that trigger those feelings.  Therapist asked patients to identify activities or techniques that help cope with those feelings. Pt was actively engaged in the process.  Therapist encouraged Pts to take a personal inventory of each day and praise themselves for using positive coping skills.  Therapist offered support and encouragement.  Progress noted.  Intervention effective.         Marni Griffon C 05/02/2012  10:30 AM

## 2012-05-02 NOTE — Progress Notes (Signed)
Pt reports that she slept well last night and has not had any hallucinations today, si thoughts or hi thoughts. Gave scheduled medication and encouraged pt to express feelings and attend groups. Pt reports that she plans to increase her support group following discharge, see a sponsor and participate in activities to keep her away from drugs. Safety maintained on unit.

## 2012-05-02 NOTE — Progress Notes (Signed)
[facility] MD Progress Note  05/02/2012 6:24 PM  Diagnosis:  Axis I: Schizophrenia - Paranoid Type.  Substance Induced Mood Disorder.  Cocaine Abuse.  Cannabis Abuse.   The patient was seen and reports the following:   ADL's: Intact.  Sleep: The patient reports to sleeping well last night. Appetite: The patient reports that her appetite is good.   Mild>(1-10) >Severe  Hopelessness (1-10): 0  Depression (1-10): 6  Anxiety (1-10): 5   Suicidal Ideation: The patient denies any suicidal ideations today.  Plan: No  Intent: No  Means: No   Homicidal Ideation: The patient denies any homicidal ideations.  Plan: No  Intent: No.  Means: No   General Appearance/Behavior: The patient remains friendly and cooperative today with this provider.  Eye Contact: Good.  Speech: Appropriate in rate and volume today with no pressuring noted.  Motor Behavior: wnl.  Level of Consciousness: Alert and Oriented x 3.  Mental Status: Alert and Oriented x 3.  Mood: Moderate depression reported.  Affect: Appears moderately constricted today.  Anxiety Level: Moderate anxiety reported.  Thought Process: wnl. Thought Content: The patient denies any auditory or visual hallucinations today. She also denies any delusional thinking.  Perception: wnl.  Judgment: Fair to Good.  Insight: Fair to Good.  Cognition: Oriented to person, place and time.  Sleep:  Number of Hours: 6.75    Vital Signs:Blood pressure 102/66, pulse 88, temperature 98.4 F (36.9 C), temperature source Oral, resp. rate 20, height 5\' 1"  (1.549 m).  Current Medications: Current Facility-Administered Medications  Medication Dose Route Frequency Provider Last Rate Last Dose  . acetaminophen (TYLENOL) tablet 650 mg  650 mg Oral Q6H PRN Verne Spurr, PA-C   650 mg at 05/01/12 1537  . alum & mag hydroxide-simeth (MAALOX/MYLANTA) 200-200-20 MG/5ML suspension 30 mL  30 mL Oral Q4H PRN Verne Spurr, PA-C      . citalopram (CELEXA) tablet 40 mg   40 mg Oral Daily Curlene Labrum Bryanna Yim, MD   40 mg at 05/02/12 0841  . cloNIDine (CATAPRES) tablet 0.2 mg  0.2 mg Oral BID Verne Spurr, PA-C   0.2 mg at 05/02/12 1711  . gabapentin (NEURONTIN) capsule 400 mg  400 mg Oral [id number] Curlene Labrum Morley Gaumer, MD   400 mg at 05/02/12 1413  . lidocaine (LIDODERM) 5 % 1 patch  1 patch Transdermal Q24H Mike Craze, MD   1 patch at 05/01/12 2209  . loperamide (IMODIUM) capsule 2 mg  2 mg Oral PRN Alyson Kuroski-Mazzei, DO      . magnesium hydroxide (MILK OF MAGNESIA) suspension 30 mL  30 mL Oral Daily PRN Verne Spurr, PA-C      . meloxicam (MOBIC) tablet 7.5 mg  7.5 mg Oral Daily Mike Craze, MD   7.5 mg at 05/02/12 0841  . pantoprazole (PROTONIX) EC tablet 20 mg  20 mg Oral Q1200 Mike Craze, MD   20 mg at 05/02/12 1142  . risperiDONE (RISPERDAL) tablet 4 mg  4 mg Oral QHS Curlene Labrum Mekala Winger, MD   4 mg at 05/01/12 2209  . traZODone (DESYREL) tablet 100 mg  100 mg Oral QHS PRN Ronny Bacon, MD   100 mg at 04/28/12 2155   Lab Results:  Results for orders placed during the hospital encounter of 04/27/12 (from the past 48 hour(s))  COMPREHENSIVE METABOLIC PANEL     Status: Abnormal   Collection Time   05/01/12  8:21 PM      Component Value Range Comment  Sodium 135  135 - 145 mEq/L    Potassium 4.2  3.5 - 5.1 mEq/L    Chloride 100  96 - 112 mEq/L    CO2 24  19 - 32 mEq/L    Glucose, Bld 103 (*) 70 - 99 mg/dL    BUN 21  6 - 23 mg/dL    Creatinine, Ser 1.61 (*) 0.50 - 1.10 mg/dL    Calcium 9.7  8.4 - 09.6 mg/dL    Total Protein 7.0  6.0 - 8.3 g/dL    Albumin 3.3 (*) 3.5 - 5.2 g/dL    AST 16  0 - 37 U/L    ALT 9  0 - 35 U/L    Alkaline Phosphatase 75  39 - 117 U/L    Total Bilirubin 0.1 (*) 0.3 - 1.2 mg/dL    GFR calc non Af Amer 52 (*) >90 mL/min    GFR calc Af Amer 60 (*) >90 mL/min    Physical Findings: AIMS: Facial and Oral Movements Muscles of Facial Expression: None, normal Lips and Perioral Area: None, normal Jaw: None,  normal Tongue: None, normal,Extremity Movements Upper (arms, wrists, hands, fingers): None, normal Lower (legs, knees, ankles, toes): None, normal, Trunk Movements Neck, shoulders, hips: None, normal, Overall Severity Severity of abnormal movements (highest score from questions above): None, normal Incapacitation due to abnormal movements: None, normal Patient's awareness of abnormal movements (rate only patient's report): No Awareness, Dental Status Current problems with teeth and/or dentures?: Yes Does patient usually wear dentures?: Yes  CIWA:  CIWA-Ar Total: 4  COWS:  COWS Total Score: 4   Review of Systems:  Neurological: The patient denies any headaches today.  She denies any seizures or dizziness.  G.I.: The patient denies any constipation or G.I. Upset today.  Musculoskeletal: The patient denies any musculoskeletal issues today.   Time was spent today discussing with the patient her current symptoms. The patient states that she is sleeping well at night and reports a good appetite. The patient reports moderate feelings of sadness, anhedonia and depressed mood. She denies any suicidal or homicidal ideations today. She denies any auditory or visual hallucinations or delusional thinking today. The patient reports moderate anxiety today.   Treatment Plan Summary:  1. Daily contact with patient to assess and evaluate symptoms and progress in treatment.  2. Medication management  3. The patient will deny suicidal ideations or homicidal ideations for 48 hours prior to discharge and have a depression and anxiety rating of 3 or less. The patient will also deny any auditory or visual hallucinations or delusional thinking.  4. The patient will deny any symptoms of substance withdrawal at time of discharge.   Plan:  1. Will continue the medication Celexa at 40 mgs po q am for depression and anxiety.  2. Will continue the medication Risperdal at 4 mg po qhs for psychosis.  3. Will continue  the medication Neurontin at 300 mgs po q am, 2 pm and hs for pain and anxiety.  4. Will continue Trazodone at 100 mgs po qhs - prn for sleep.  5. Will continue the patient on the non-psychiatric medications as listed above.  6. Laboratory studies reviewed.  7. Will continue to monitor.   Dyani Babel 05/02/2012, 6:24 PM

## 2012-05-02 NOTE — Progress Notes (Signed)
[facility] Group Notes:  (Counselor/Nursing/MHT/Case Management/Adjunct)  05/02/2012 8:00PM  Type of Therapy:  Group Therapy  Participation Level:  Active  Participation Quality:  Appropriate  Affect:  Appropriate  Cognitive:  Alert and Oriented  Insight:  Good  Engagement in Group:  Good  Engagement in Therapy:  Good  Modes of Intervention:  Clarification and Support  Summary of Progress/Problems: Pt was able to attend participate and share approiprately in group.  Lakeasha Petion, Randal Buba 05/02/2012, 10:35 PM

## 2012-05-03 MED ORDER — GABAPENTIN 300 MG PO CAPS
300.0000 mg | ORAL_CAPSULE | ORAL | Status: DC
  Administered 2012-05-03 – 2012-05-08 (×14): 300 mg via ORAL
  Filled 2012-05-03: qty 9
  Filled 2012-05-03 (×2): qty 1
  Filled 2012-05-03: qty 9
  Filled 2012-05-03 (×2): qty 1
  Filled 2012-05-03: qty 9
  Filled 2012-05-03 (×5): qty 1
  Filled 2012-05-03: qty 9
  Filled 2012-05-03: qty 1
  Filled 2012-05-03: qty 9
  Filled 2012-05-03: qty 1
  Filled 2012-05-03: qty 9
  Filled 2012-05-03 (×8): qty 1

## 2012-05-03 MED ORDER — MELOXICAM 7.5 MG PO TABS
15.0000 mg | ORAL_TABLET | Freq: Every day | ORAL | Status: DC
  Administered 2012-05-04 – 2012-05-08 (×5): 15 mg via ORAL
  Filled 2012-05-03 (×5): qty 1
  Filled 2012-05-03 (×2): qty 6
  Filled 2012-05-03: qty 1

## 2012-05-03 NOTE — Progress Notes (Signed)
Date: 05/03/2012  Time: 0930   Group Topic/Focus: The focus of this group is on enhancing the patient's understanding of leisure, barriers to leisure, and the importance of engaging in positive leisure activities upon discharge for improved total health.   Participation Level:  Minimal  Participation Quality:  Drowsy  Affect:  Blunted  Cognitive:  Alert   Additional Comments: Patient sat in the corner of the group room, was attentive but appeared very drowsy, not verbalizing much.   Danielle [name]  05/03/2012 11:37 AM

## 2012-05-03 NOTE — Progress Notes (Signed)
[facility] Group Notes:  (Counselor/Nursing/MHT/Case Management/Adjunct)  717/2013 2:00 PM  Type of Therapy:  [employer] Support  Participation Level:   Did not attend.   Marni Griffon C 05/03/2012, 2:00 PM

## 2012-05-03 NOTE — Therapy (Signed)
Psychoeducational Group Note  Date:  05/03/2012 Time:  2030  Group Topic/Focus:  Wrap-Up Group:   The focus of this group is to help patients review their daily goal of treatment and discuss progress on daily workbooks.  Participation Level:  Active  Participation Quality:  Resistant  Affect:  Blunted  Cognitive:  Appropriate  Insight:  Good  Engagement in Group:  Good  Additional Comments:  Patient attended and participated in wrap-up group this evening.  Tamu Golz, Newton Pigg 05/03/2012, 11:39 PM

## 2012-05-03 NOTE — Progress Notes (Signed)
Psychoeducational Group Note  Date:  05/03/2012 Time:  1100  Group Topic/Focus:  Emotional Education:   The focus of this group is to discuss what feelings/emotions are, and how they are experienced.  Participation Level:  Minimal  Participation Quality:  Appropriate, Drowsy and Sharing  Affect:  Appropriate  Cognitive:  Appropriate  Insight:  Good  Engagement in Group:  Good  Additional Comments:  Pt dozed in and out. When pt was woke, pt participated.  Isla Pence M 05/03/2012, 1:18 PM

## 2012-05-03 NOTE — Progress Notes (Signed)
Pt c/o of lower back pain this morning and refused prn tylenol or ice pack stating that it would not help. Pt affect is blunted and sad. Pt stated that depression is at a 7 on 1-10 scale with 10 being the most depressed. Supported pt to express feeling. Encouraged groups and offered 15 minute checks. Reported back pain to MD. Pt denies si and hi. Safety maintained on unit.

## 2012-05-03 NOTE — Progress Notes (Signed)
[facility] MD Progress Note  05/03/2012 3:43 PM  Diagnosis:  Axis I: Schizophrenia - Paranoid Type.  Substance Induced Mood Disorder.  Cocaine Abuse.  Cannabis Abuse.   The patient was seen and reports the following:   ADL's: Intact.  Sleep: The patient reports to sleeping well last night but states she feels oversedated this morning.  Appetite: The patient reports that her appetite is good.   Mild>(1-10) >Severe  Hopelessness (1-10): 0  Depression (1-10): 7  Anxiety (1-10): 6 Pain (1-10): 7  Suicidal Ideation: The patient denies any suicidal ideations today.  Plan: No  Intent: No  Means: No  Homicidal Ideation: The patient denies any homicidal ideations.  Plan: No  Intent: No.  Means: No   General Appearance/Behavior: The patient remains friendly and cooperative today with this provider.  Eye Contact: Good.  Speech: Appropriate in rate and volume today with no pressuring noted.  Motor Behavior: wnl.  Level of Consciousness: Alert and Oriented x 3.  Mental Status: Alert and Oriented x 3.  Mood: Moderate depression reported.  Affect: Appears moderately constricted today.  Anxiety Level: Moderate anxiety reported.  Thought Process: wnl.  Thought Content: The patient denies any auditory or visual hallucinations today. She also denies any delusional thinking.  Perception: wnl.  Judgment: Fair to Good.  Insight: Fair to Good.  Cognition: Oriented to person, place and time.  Sleep:  Number of Hours: 6.75    Vital Signs:Blood pressure 124/76, pulse 87, temperature 98.2 F (36.8 C), temperature source Oral, resp. rate 16, height 5\' 1"  (1.549 m).  Current Medications: Current Facility-Administered Medications  Medication Dose Route Frequency Provider Last Rate Last Dose  . acetaminophen (TYLENOL) tablet 650 mg  650 mg Oral Q6H PRN Verne Spurr, PA-C   650 mg at 05/01/12 1537  . alum & mag hydroxide-simeth (MAALOX/MYLANTA) 200-200-20 MG/5ML suspension 30 mL  30 mL Oral Q4H PRN  Verne Spurr, PA-C      . citalopram (CELEXA) tablet 40 mg  40 mg Oral Daily Curlene Labrum Samaiyah Howes, MD   40 mg at 05/03/12 0838  . cloNIDine (CATAPRES) tablet 0.2 mg  0.2 mg Oral BID Verne Spurr, PA-C   0.2 mg at 05/03/12 0839  . gabapentin (NEURONTIN) capsule 300 mg  300 mg Oral [id number] Nikcole Eischeid D Nadine Ryle, MD      . lidocaine (LIDODERM) 5 % 1 patch  1 patch Transdermal Q24H Ronny Bacon, MD   1 patch at 05/02/12 2222  . loperamide (IMODIUM) capsule 2 mg  2 mg Oral PRN Alyson Kuroski-Mazzei, DO      . magnesium hydroxide (MILK OF MAGNESIA) suspension 30 mL  30 mL Oral Daily PRN Verne Spurr, PA-C      . meloxicam (MOBIC) tablet 15 mg  15 mg Oral Daily Parish Dubose D Takayla Baillie, MD      . pantoprazole (PROTONIX) EC tablet 20 mg  20 mg Oral Q1200 Curlene Labrum Dalila Arca, MD   20 mg at 05/03/12 1206  . risperiDONE (RISPERDAL) tablet 4 mg  4 mg Oral QHS Curlene Labrum Tiona Ruane, MD   4 mg at 05/02/12 2222  . traZODone (DESYREL) tablet 100 mg  100 mg Oral QHS PRN Ronny Bacon, MD   100 mg at 04/28/12 2155  . DISCONTD: gabapentin (NEURONTIN) capsule 400 mg  400 mg Oral [id number] Curlene Labrum Roland Prine, MD   400 mg at 05/03/12 1350  . DISCONTD: meloxicam (MOBIC) tablet 7.5 mg  7.5 mg Oral Daily Mike Craze, MD   7.5 mg  at 05/03/12 1610   Lab Results:  Results for orders placed during the hospital encounter of 04/27/12 (from the past 48 hour(s))  COMPREHENSIVE METABOLIC PANEL     Status: Abnormal   Collection Time   05/01/12  8:21 PM      Component Value Range Comment   Sodium 135  135 - 145 mEq/L    Potassium 4.2  3.5 - 5.1 mEq/L    Chloride 100  96 - 112 mEq/L    CO2 24  19 - 32 mEq/L    Glucose, Bld 103 (*) 70 - 99 mg/dL    BUN 21  6 - 23 mg/dL    Creatinine, Ser 9.60 (*) 0.50 - 1.10 mg/dL    Calcium 9.7  8.4 - 45.4 mg/dL    Total Protein 7.0  6.0 - 8.3 g/dL    Albumin 3.3 (*) 3.5 - 5.2 g/dL    AST 16  0 - 37 U/L    ALT 9  0 - 35 U/L    Alkaline Phosphatase 75  39 - 117 U/L    Total Bilirubin 0.1 (*) 0.3 -  1.2 mg/dL    GFR calc non Af Amer 52 (*) >90 mL/min    GFR calc Af Amer 60 (*) >90 mL/min    Physical Findings: AIMS: Facial and Oral Movements Muscles of Facial Expression: None, normal Lips and Perioral Area: None, normal Jaw: None, normal Tongue: None, normal,Extremity Movements Upper (arms, wrists, hands, fingers): None, normal Lower (legs, knees, ankles, toes): None, normal, Trunk Movements Neck, shoulders, hips: None, normal, Overall Severity Severity of abnormal movements (highest score from questions above): None, normal Incapacitation due to abnormal movements: None, normal Patient's awareness of abnormal movements (rate only patient's report): No Awareness, Dental Status Current problems with teeth and/or dentures?: Yes Does patient usually wear dentures?: Yes  CIWA:  CIWA-Ar Total: 4  COWS:  COWS Total Score: 4   Review of Systems:  Neurological: The patient denies any headaches today. She denies any seizures or dizziness.  G.I.: The patient denies any constipation or G.I. Upset today.  Musculoskeletal: The patient reports significant back pain today.   Time was spent today discussing with the patient her current symptoms. The patient states that she is continuing to sleep well at night but feels oversedated this morning.  She reports a good appetite. The patient reports moderate feelings of sadness, anhedonia and depressed mood. She denies any suicidal or homicidal ideations today. She denies any auditory or visual hallucinations or delusional thinking today. The patient reports moderate anxiety today.  She reports back pain which is at a level 7 today.  Treatment Plan Summary:  1. Daily contact with patient to assess and evaluate symptoms and progress in treatment.  2. Medication management  3. The patient will deny suicidal ideations or homicidal ideations for 48 hours prior to discharge and have a depression and anxiety rating of 3 or less. The patient will also deny any  auditory or visual hallucinations or delusional thinking.  4. The patient will deny any symptoms of substance withdrawal at time of discharge.   Plan:  1. Will continue the medication Celexa at 40 mgs po q am for depression and anxiety.  2. Will continue the medication Risperdal at 4 mg po qhs for psychosis.  3. Will continue the medication Neurontin at 300 mgs po q am, 2 pm and hs for pain and anxiety.  4. Will continue Trazodone at 100 mgs po qhs - prn for  sleep.  5. Will increase the medication Mobic to 15 mgs po q am for back pain. 6. Will continue the patient on the non-psychiatric medications as listed above.  7. Laboratory studies reviewed.  8. Will continue to monitor.    Leor Whyte 05/03/2012, 3:43 PM

## 2012-05-04 MED ORDER — BUPROPION HCL ER (SR) 150 MG PO TB12
150.0000 mg | ORAL_TABLET | Freq: Every day | ORAL | Status: DC
  Administered 2012-05-05 – 2012-05-08 (×4): 150 mg via ORAL
  Filled 2012-05-04: qty 1
  Filled 2012-05-04: qty 3
  Filled 2012-05-04: qty 1
  Filled 2012-05-04: qty 3
  Filled 2012-05-04 (×4): qty 1

## 2012-05-04 NOTE — Progress Notes (Signed)
[facility] Group Notes:  (Counselor/Nursing/MHT/Case Management/Adjunct)  05/04/2012  10:30  AM  Type of Therapy: Group Therapy   Participation Level:  Did Not Attend     [name] 05/04/2012  10:30 AM

## 2012-05-04 NOTE — Progress Notes (Signed)
Patient ID: Danielle [name], female   DOB: 1951-08-13, 61 y.o.   MRN: [id number] D: Pt. In day room watching, sad effect. Pt. Reports depression at "6-7" on a 1-10 scale. Pt. Denies SHI. Pt. Reports "things going pretty good". Pt. Does note that medications have been helpful. A: Writer reviewed meds and administration times, safety checks q48min R: Pt. Remains safe on the units, verbalizes understanding meds and administration.

## 2012-05-04 NOTE — Progress Notes (Signed)
[facility] MD Progress Note  05/04/2012 5:09 PM  Diagnosis:  Axis I: Schizophrenia - Paranoid Type.  Substance Induced Mood Disorder.  Cocaine Abuse.  Cannabis Abuse.   The patient was seen and reports the following:   ADL's: Intact.  Sleep: The patient reports to sleeping well again last night but states she again feels oversedated this morning.  Appetite: The patient reports that her appetite is good.   Mild>(1-10) >Severe  Hopelessness (1-10): 6  Depression (1-10): 7  Anxiety (1-10): 6   Suicidal Ideation: The patient denies any suicidal ideations today.  Plan: No  Intent: No  Means: No   Homicidal Ideation: The patient denies any homicidal ideations.  Plan: No  Intent: No.  Means: No   General Appearance/Behavior: The patient remains friendly and cooperative today with this provider.  Eye Contact: Good.  Speech: Appropriate in rate and volume today with no pressuring noted.  Motor Behavior: wnl.  Level of Consciousness: Alert and Oriented x 3.  Mental Status: Alert and Oriented x 3.  Mood: Moderate depression reported.  Affect: Appears moderately constricted today.  Anxiety Level: Moderate anxiety reported.  Thought Process: wnl.  Thought Content: The patient denies any auditory or visual hallucinations today. She also denies any delusional thinking.  Perception: wnl.  Judgment: Fair to Good.  Insight: Fair to Good.  Cognition: Oriented to person, place and time.  Sleep:  Number of Hours: 6.75    Vital Signs:Blood pressure 148/91, pulse 85, temperature 98.7 F (37.1 C), temperature source Oral, resp. rate 20, height 5\' 1"  (1.549 m).  Current Medications: Current Facility-Administered Medications  Medication Dose Route Frequency Provider Last Rate Last Dose  . acetaminophen (TYLENOL) tablet 650 mg  650 mg Oral Q6H PRN Verne Spurr, PA-C   650 mg at 05/04/12 0809  . alum & mag hydroxide-simeth (MAALOX/MYLANTA) 200-200-20 MG/5ML suspension 30 mL  30 mL Oral Q4H PRN  Verne Spurr, PA-C      . buPROPion [facility] SR) 12 hr tablet 150 mg  150 mg Oral Q breakfast Curlene Labrum Readling, MD      . citalopram (CELEXA) tablet 40 mg  40 mg Oral Daily Curlene Labrum Readling, MD   40 mg at 05/04/12 0810  . cloNIDine (CATAPRES) tablet 0.2 mg  0.2 mg Oral BID Verne Spurr, PA-C   0.2 mg at 05/04/12 0810  . gabapentin (NEURONTIN) capsule 300 mg  300 mg Oral [id number] Curlene Labrum Readling, MD   300 mg at 05/04/12 1400  . lidocaine (LIDODERM) 5 % 1 patch  1 patch Transdermal Q24H Ronny Bacon, MD   1 patch at 05/03/12 2154  . loperamide (IMODIUM) capsule 2 mg  2 mg Oral PRN Alyson Kuroski-Mazzei, DO      . magnesium hydroxide (MILK OF MAGNESIA) suspension 30 mL  30 mL Oral Daily PRN Verne Spurr, PA-C      . meloxicam (MOBIC) tablet 15 mg  15 mg Oral Daily Curlene Labrum Readling, MD   15 mg at 05/04/12 0810  . pantoprazole (PROTONIX) EC tablet 20 mg  20 mg Oral Q1200 Curlene Labrum Readling, MD   20 mg at 05/04/12 1201  . risperiDONE (RISPERDAL) tablet 4 mg  4 mg Oral QHS Curlene Labrum Readling, MD   4 mg at 05/03/12 2154  . traZODone (DESYREL) tablet 100 mg  100 mg Oral QHS PRN Ronny Bacon, MD   100 mg at 04/28/12 2155   Lab Results: No results found for this or any previous visit (from  the past 48 hour(s)).  Physical Findings: AIMS: Facial and Oral Movements Muscles of Facial Expression: None, normal Lips and Perioral Area: None, normal Jaw: None, normal Tongue: None, normal,Extremity Movements Upper (arms, wrists, hands, fingers): None, normal Lower (legs, knees, ankles, toes): None, normal, Trunk Movements Neck, shoulders, hips: None, normal, Overall Severity Severity of abnormal movements (highest score from questions above): None, normal Incapacitation due to abnormal movements: None, normal Patient's awareness of abnormal movements (rate only patient's report): No Awareness, Dental Status Current problems with teeth and/or dentures?: Yes Does patient usually wear dentures?: Yes   CIWA:  CIWA-Ar Total: 4  COWS:  COWS Total Score: 4   Review of Systems:  Neurological: The patient denies any headaches today. She denies any seizures or dizziness.  G.I.: The patient denies any constipation or G.I. Upset today.  Musculoskeletal: The patient reports significant back pain today.   Time was spent today discussing with the patient her current symptoms. The patient states that she is continuing to sleep well at night but again feels oversedated this morning. She reports a good appetite and reports ongoing moderate feelings of sadness, anhedonia and depressed mood. She denies any suicidal or homicidal ideations today. She denies any auditory or visual hallucinations or delusional thinking today. The patient reports moderate anxiety today. She reports her back pain today is much improved.    Treatment Plan Summary:  1. Daily contact with patient to assess and evaluate symptoms and progress in treatment.  2. Medication management  3. The patient will deny suicidal ideations or homicidal ideations for 48 hours prior to discharge and have a depression and anxiety rating of 3 or less. The patient will also deny any auditory or visual hallucinations or delusional thinking.  4. The patient will deny any symptoms of substance withdrawal at time of discharge.   Plan:  1. Will continue the medication Celexa at 40 mgs po q am for depression and anxiety.  2. Will continue the medication Risperdal at 4 mg po qhs for psychosis.  3. Will continue the medication Neurontin at 300 mgs po q am, 2 pm and hs for pain and anxiety.  4. Will continue Trazodone at 100 mgs po qhs - prn for sleep.  5. Will continue the medication Mobic at 15 mgs po q am for back pain.  6. Will start the medication Wellbutrin SR at 150 mgs po q am to also address her depressive symptoms. 7. Will continue the patient on the non-psychiatric medications as listed above.  8. Laboratory studies reviewed.  9. Will continue to  monitor.    RANDY READLING 05/04/2012, 5:09 PM

## 2012-05-04 NOTE — Progress Notes (Signed)
D:  Pt. About on the unit today.  She has a flat, sad affect today.  She denies any SI/HI or any A/V hallucinations.  She does voice sadness with female peer left earlier today.  She is noted to smile on occasion.  She c/o of left hip pain this am and was given prn Tylenol which she said was helpful.  A:  Offered support and encouragement and given meds as prescribed.  R:  Receptive to staff, cooperative, remains on q 15 minute checks for safety.  Will continue to monitor.

## 2012-05-04 NOTE — Discharge Planning (Signed)
Met with patient in Aftercare Planning Group and provided today's workbook based on theme of the day. She was significantly flatter in affect than yesterday, stated her arthritic hip was hurting.  She also is worried because she has now been offered 3 places to live at discharge, and in the past her decision-making has led her into trouble, so she would like to discuss this with [place], then let us make the decision.  Case Manager explained to her that we will help her to talk it out, but she will need to make the final decision.  This was also discussed with her in Treatment Team, and counselor offered to make her sheets on which she can make pros/cons lists for each potential home.  Per State Regulation 482.30  This chart was reviewed for medical necessity with respect to the patient's Admission/Duration of stay.   Next review due:  05/07/12   Ambrose Mantle, LCSW  05/04/2012  6:54 PM

## 2012-05-04 NOTE — Progress Notes (Signed)
Currently resting quietly in bed with eyes closed. Respirations are even and unlabored. No acute distress noted. Safety has been maintained with Q15 minute observation. Will continue current POC. 

## 2012-05-04 NOTE — Progress Notes (Signed)
Psychoeducational Group Note  Date:  05/04/2012 Time:  1100  Group Topic/Focus:  Emotional Education:   The focus of this group is to discuss what feelings/emotions are, and how they are experienced.  Participation Level:  Did Not Attend  Participation Quality:    Affect:    Cognitive:    Insight:    Engagement in Group:    Additional Comments:  pt refused to come.  Isla Pence M 05/04/2012, 3:37 PM

## 2012-05-05 NOTE — Progress Notes (Signed)
05/05/2012         Time: 0930       Group Topic/Focus: The focus of the group is on enhancing the patients' ability to cope with stressors by understanding what coping is, why it is important, the negative effects of stress and developing healthier coping skills. Patients practice [place] and discuss how exercise can be used as a healthy coping strategy.  Participation Level: Active  Participation Quality: Attentive  Affect: Blunted  Cognitive: Oriented  Additional Comments: Patient participated with encouragement, able to identify activities she can engage in to help relax.  Jla Reynolds 05/05/2012 11:55 AM

## 2012-05-05 NOTE — Progress Notes (Signed)
Patient ID: Danielle [name], female   DOB: 05/03/1951, 61 y.o.   MRN: [id number]  Pt currently asleep; no s/s of distress noted at this time.

## 2012-05-05 NOTE — Discharge Planning (Signed)
Met with patient in Aftercare Planning Group and provided today's workbook based on theme of the day. She stated that her arthritis has not been hurting her today.  Her affect was broader, and mood improved from yesterday.  Case Manager confirmed already existent appointment with [facility], Dr. Ladona Ridgel for 4:30PM on 05/09/12.  In order to get help with filling her medication box, the scheduler advises that Dr. Lubertha Basque RN will make that decision and should be asked at the time patient comes to the appointment.  Ambrose Mantle, LCSW 05/05/2012, 2:40 PM

## 2012-05-05 NOTE — Progress Notes (Signed)
Psychoeducational Group Note  Date:  05/05/2012 Time: 1100  Group Topic/Focus:  Healthy Communication:   The focus of this group is to discuss communication, barriers to communication, as well as healthy ways to communicate with others. Relapse Prevention Planning:   The focus of this group is to define relapse and discuss the need for planning to combat relapse.  Participation Level:  Appropriate  Participation Quality:Appropriate    Affect:  Appropriate  Cognitive:  Appropriate  Insight:  Good, sharring  Engagement in Group:  Good  Additional Comments:  none  Kween Bacorn, Genia Plants 05/05/2012, 1:52 PM

## 2012-05-05 NOTE — Progress Notes (Signed)
Patient ID: Danielle [name], female   DOB: 1950/11/30, 61 y.o.   MRN: [id number] [facility] MD Progress Note  05/05/2012 1:48 PM  Diagnosis:  Axis I: Schizophrenia - Paranoid Type.  Substance Induced Mood Disorder.  Cocaine Abuse.  Cannabis Abuse.     ADL's: Intact.  Sleep: The patient reports to sleeping well again last night  Appetite: The patient reports that her appetite is good.   Mild>(1-10) >Severe  Hopelessness (1-10): 6  Depression (1-10): 7  Anxiety (1-10): 6   Suicidal Ideation: The patient denies any suicidal ideations today.  Plan: No  Intent: No  Means: No   Homicidal Ideation: The patient denies any homicidal ideations.  Plan: No  Intent: No.  Means: No   General Appearance/Behavior: The patient remains friendly and alert this morning Eye Contact: Good.  Speech: Appropriate in rate, tone, volume and spontaneous Motor Behavior: wnl.  Level of Consciousness: Alert and Oriented x 3.  Mental Status: Alert and Oriented x 3.  Mood: Moderate depression reported.  Affect: Appears more interactive but still constricted  Anxiety Level: Moderate anxiety reported.  Thought Process: wnl.  Thought Content: The patient denies any auditory or visual hallucinations today. She also denies any delusional thinking.  Perception: wnl.  Judgment: Fair to Good.  Insight: Fair to Good.  Cognition: Oriented to person, place and time.  Sleep:  Number of Hours: 6.75    Vital Signs:Blood pressure 132/85, pulse 84, temperature 97.5 F (36.4 C), temperature source Oral, resp. rate 16, height 5\' 1"  (1.549 m).  Current Medications: Current Facility-Administered Medications  Medication Dose Route Frequency Provider Last Rate Last Dose  . acetaminophen (TYLENOL) tablet 650 mg  650 mg Oral Q6H PRN Verne Spurr, PA-C   650 mg at 05/04/12 0809  . alum & mag hydroxide-simeth (MAALOX/MYLANTA) 200-200-20 MG/5ML suspension 30 mL  30 mL Oral Q4H PRN Verne Spurr, PA-C      . buPROPion  [facility] SR) 12 hr tablet 150 mg  150 mg Oral Q breakfast Curlene Labrum Readling, MD   150 mg at 05/05/12 0814  . citalopram (CELEXA) tablet 40 mg  40 mg Oral Daily Curlene Labrum Readling, MD   40 mg at 05/05/12 0814  . cloNIDine (CATAPRES) tablet 0.2 mg  0.2 mg Oral BID Verne Spurr, PA-C   0.2 mg at 05/05/12 0814  . gabapentin (NEURONTIN) capsule 300 mg  300 mg Oral [id number] Curlene Labrum Readling, MD   300 mg at 05/05/12 0814  . lidocaine (LIDODERM) 5 % 1 patch  1 patch Transdermal Q24H Ronny Bacon, MD   1 patch at 05/04/12 2221  . loperamide (IMODIUM) capsule 2 mg  2 mg Oral PRN Alyson Kuroski-Mazzei, DO      . magnesium hydroxide (MILK OF MAGNESIA) suspension 30 mL  30 mL Oral Daily PRN Verne Spurr, PA-C      . meloxicam (MOBIC) tablet 15 mg  15 mg Oral Daily Curlene Labrum Readling, MD   15 mg at 05/05/12 0814  . pantoprazole (PROTONIX) EC tablet 20 mg  20 mg Oral Q1200 Curlene Labrum Readling, MD   20 mg at 05/05/12 1209  . risperiDONE (RISPERDAL) tablet 4 mg  4 mg Oral QHS Curlene Labrum Readling, MD   4 mg at 05/04/12 2220  . traZODone (DESYREL) tablet 100 mg  100 mg Oral QHS PRN Ronny Bacon, MD   100 mg at 04/28/12 2155   Lab Results: No results found for this or any previous visit (from the past 48  hour(s)).  Physical Findings: AIMS: Facial and Oral Movements Muscles of Facial Expression: None, normal Lips and Perioral Area: None, normal Jaw: None, normal Tongue: None, normal,Extremity Movements Upper (arms, wrists, hands, fingers): None, normal Lower (legs, knees, ankles, toes): None, normal, Trunk Movements Neck, shoulders, hips: None, normal, Overall Severity Severity of abnormal movements (highest score from questions above): None, normal Incapacitation due to abnormal movements: None, normal Patient's awareness of abnormal movements (rate only patient's report): No Awareness, Dental Status Current problems with teeth and/or dentures?: No Does patient usually wear dentures?: No  CIWA:  CIWA-Ar  Total: 4  COWS:  COWS Total Score: 4   Review of Systems:  Neurological: The patient denies any headaches today. She denies any seizures or dizziness.  G.I.: The patient denies any constipation or G.I. Upset today.  Musculoskeletal: The patient reports significant back pain today.   Time was spent today discussing with the patient her current symptoms. The patient states that she is continuing to sleep well at night . She reports that her mood has improved some, there no side effects with the Wellbutrin. She reports continued anxiety. There is much improvement in her pain today  Treatment Plan Summary:  1. Daily contact with patient to assess and evaluate symptoms and progress in treatment.  2. Medication management  3. The patient will deny suicidal ideations or homicidal ideations for 48 hours prior to discharge and have a depression and anxiety rating of 3 or less. The patient will also deny any auditory or visual hallucinations or delusional thinking.  4. The patient will deny any symptoms of substance withdrawal at time of discharge.   Plan:  1. Will continue the medication Celexa at 40 mgs po q am for depression and anxiety.  2. Will continue the medication Risperdal at 4 mg po qhs for psychosis.  3. Will continue the medication Neurontin at 300 mgs po q am, 2 pm and hs for pain and anxiety.  4. Will continue Trazodone at 100 mgs po qhs - prn for sleep.  5. Will continue the medication Mobic at 15 mgs po q am for back pain.  6. Will continue the medication Wellbutrin SR at 150 mgs po q am to also address her depressive symptoms. 7. Will continue the patient on the non-psychiatric medications as listed above.  8. patient continues to struggle with being able to fill her pill box in order to take her medications regularly as prescribed. Patient's case manager is working with Vesta Mixer to see how this can be done on regular basis outpatient. This is essential to keep patient medically and  psychiatrically stable outpatient   [facility] 05/05/2012, 1:48 PM

## 2012-05-05 NOTE — Progress Notes (Signed)
[facility] Group Notes:  (Counselor/Nursing/MHT/Case Management/Adjunct)  05/05/2012  2:30  PM  Type of Therapy: Group Therapy   Participation Level: Minimal   Participation Quality: Limited  Affect: Blunted  Cognitive: oriented, alert   Insight: Poor  Engagement in Group: Limited  Modes of Intervention: Clarification, Education, Problem-solving, Socialization, Activity, Encouragement and Support   Summary of Progress/Problems: Pt participated in group by listening attentively and self disclosing.  After a brief check-in, therapist introduced the topic of Feelings Around Relapse.  Therapist guided patients to explore emotions they have related to recovery.  Therapist prompted patients to answer the following questions: Which emotions come before relapse?  Which emotions result after the relapse:  Which emotions are related to their recovery; and Ways to respond to other people's emotional reaction to their relapse and recovery.  Patient was minimally engaged in the process and reported she needed to cope with depression and take her medications correctly in order to maintain recovery.  Pt participated in the Positive Affirmation Activity, by giving and receiving positive affirmations.  Therapist offered support and encouragement.  Some progress noted.  Intervention effective.         Marni Griffon C 05/05/2012  2:30 PM

## 2012-05-05 NOTE — Progress Notes (Signed)
D: Pt denies SI/HI/AV. Pt is pleasant and cooperative. Pt rates depression at a 5, anxiety at a 4, and Helplessness/hopelessness at a 8.  A: Pt was offered support and encouragement. Pt was given scheduled medications. Pt was encourage to attend groups. Q 15 minute checks were done for safety.  R:Pt attends groups and interacts well with peers and staff. Pt is taking medication. Pt has no complaints.Pt receptive to treatment and safety maintained on unit.

## 2012-05-06 NOTE — Progress Notes (Signed)
Psychoeducational Group Note  Date:  05/06/2012 Time: 100  Group Topic/Focus:  Identifying Needs:   The focus of this group is to help patients identify their personal needs that have been historically problematic and identify healthy behaviors to address their needs.  Participation Level:  Active  Participation Quality:  Appropriate  Affect:  Appropriate  Cognitive:  Appropriate  Insight:  Good  Engagement in Group:  Good  Additional Comments:   Valente David 05/06/2012, 10:00 AM

## 2012-05-06 NOTE — Progress Notes (Signed)
Patient ID: Lurlean Kernen, female   DOB: Oct 14, 1951, 61 y.o.   MRN: [id number]   [facility] Group Notes:  (Counselor/Nursing/MHT/Case Management/Adjunct)  05/06/2012 11 AM  Type of Therapy:  Aftercare Planning, Group Therapy, Dance/Movement Therapy   Participation Level:  Did Not Attend  [name] 05/06/2012. 1:41 PM

## 2012-05-06 NOTE — Progress Notes (Signed)
Psychoeducational Group Note  Date:  05/06/2012 Time: 2000  Group Topic/Focus:  Wrap-Up Group:   The focus of this group is to help patients review their daily goal of treatment and discuss progress on daily workbooks.  Participation Level:  Active  Participation Quality:  Appropriate  Affect:  Appropriate  Cognitive:  Appropriate  Insight:  Good  Engagement in Group:  Good  Additional Comments:  Pt participated in group. Pt was very pleasant.   Itali Mckendry A 05/06/2012, 2:32 AM

## 2012-05-06 NOTE — Progress Notes (Signed)
Patient ID: Danielle [name], female   DOB: 05-16-1951, 61 y.o.   MRN: [id number]  Problem: Schizophrenia, Anxiety, Depression  D: Pt pleasant and cooperative, but endorses depression. Pt out in milieu, eating well.  A: Monitor Q 15 minutes for safety, encourage staff/peer interaction and group participation. Administer medications as ordered by MD.  R: Pt compliant with HS medications, participated in group session. Pt denies SI or plans to harm herself, but has dull, flat affect.

## 2012-05-06 NOTE — Progress Notes (Signed)
Psychoeducational Group Note  Date:  05/06/2012 Time:  1515   Group Topic/Focus:  Healthy Communication:   The focus of this group is to discuss communication, barriers to communication, as well as healthy ways to communicate with others.  Participation Level:  Minimal  Participation Quality:  Appropriate  Affect:  Appropriate  Cognitive:  Appropriate  Insight:  Good  Engagement in Group:  Good  Additional Comments:  Pt participated and processed in group.  Danielle [name], Danielle [name] 05/06/2012, 6:54 PM

## 2012-05-06 NOTE — Progress Notes (Signed)
Patient ID: Danielle [name], female   DOB: 03/10/1951, 61 y.o.   MRN: [id number] [facility] MD Progress Note  05/06/2012 6:30 PM  Diagnosis:  Axis I: Schizophrenia - Paranoid Type.  Substance Induced Mood Disorder.  Cocaine Abuse.  Cannabis Abuse.     ADL's: Intact.  Sleep: The patient reports to sleeping well again last night  Appetite: The patient reports that her appetite is good.   Mild>(1-10) >Severe  Hopelessness (1-10): 5 Depression (1-10): 5  Anxiety (1-10): 4  Suicidal Ideation: The patient denies any suicidal ideations today.  Plan: No  Intent: No  Means: No   Homicidal Ideation: The patient denies any homicidal ideations.  Plan: No  Intent: No.  Means: No   General Appearance/Behavior: The patient remains friendly Eye Contact: Good.  Speech: Appropriate in rate, tone, volume and spontaneous Motor Behavior: wnl.  Level of Consciousness: Alert and Oriented x 3.  Mental Status: Alert and Oriented x 3.  Mood: Moderate depression reported.  Affect: Appears more interactive but still constricted  Anxiety Level: Moderate anxiety reported.  Thought Process: wnl.  Thought Content: The patient denies any auditory or visual hallucinations today. She also denies any delusional thinking.  Perception: wnl.  Judgment: Fair to Good.  Insight: Fair to Good.  Cognition: Oriented to person, place and time.  Sleep:  Number of Hours: 5.75    Vital Signs:Blood pressure 137/90, pulse 89, temperature 97.6 F (36.4 C), temperature source Oral, resp. rate 18, height 5\' 1"  (1.549 m).  Current Medications: Current Facility-Administered Medications  Medication Dose Route Frequency Provider Last Rate Last Dose  . acetaminophen (TYLENOL) tablet 650 mg  650 mg Oral Q6H PRN Verne Spurr, PA-C   650 mg at 05/04/12 0809  . alum & mag hydroxide-simeth (MAALOX/MYLANTA) 200-200-20 MG/5ML suspension 30 mL  30 mL Oral Q4H PRN Verne Spurr, PA-C      . buPROPion [facility] SR) 12 hr tablet 150  mg  150 mg Oral Q breakfast Curlene Labrum Readling, MD   150 mg at 05/06/12 0840  . citalopram (CELEXA) tablet 40 mg  40 mg Oral Daily Curlene Labrum Readling, MD   40 mg at 05/06/12 0840  . cloNIDine (CATAPRES) tablet 0.2 mg  0.2 mg Oral BID Verne Spurr, PA-C   0.2 mg at 05/06/12 1633  . gabapentin (NEURONTIN) capsule 300 mg  300 mg Oral [id number] Curlene Labrum Readling, MD   300 mg at 05/06/12 1416  . lidocaine (LIDODERM) 5 % 1 patch  1 patch Transdermal Q24H Ronny Bacon, MD   1 patch at 05/05/12 2211  . loperamide (IMODIUM) capsule 2 mg  2 mg Oral PRN Alyson Kuroski-Mazzei, DO      . magnesium hydroxide (MILK OF MAGNESIA) suspension 30 mL  30 mL Oral Daily PRN Verne Spurr, PA-C      . meloxicam (MOBIC) tablet 15 mg  15 mg Oral Daily Curlene Labrum Readling, MD   15 mg at 05/06/12 0840  . pantoprazole (PROTONIX) EC tablet 20 mg  20 mg Oral Q1200 Curlene Labrum Readling, MD   20 mg at 05/06/12 1245  . risperiDONE (RISPERDAL) tablet 4 mg  4 mg Oral QHS Curlene Labrum Readling, MD   4 mg at 05/05/12 2210  . traZODone (DESYREL) tablet 100 mg  100 mg Oral QHS PRN Ronny Bacon, MD   100 mg at 04/28/12 2155   Lab Results: No results found for this or any previous visit (from the past 48 hour(s)).  Physical Findings: AIMS: Facial  and Oral Movements Muscles of Facial Expression: None, normal Lips and Perioral Area: None, normal Jaw: None, normal Tongue: None, normal,Extremity Movements Upper (arms, wrists, hands, fingers): None, normal Lower (legs, knees, ankles, toes): None, normal, Trunk Movements Neck, shoulders, hips: None, normal, Overall Severity Severity of abnormal movements (highest score from questions above): None, normal Incapacitation due to abnormal movements: None, normal Patient's awareness of abnormal movements (rate only patient's report): No Awareness, Dental Status Current problems with teeth and/or dentures?: No Does patient usually wear dentures?: No  CIWA:  CIWA-Ar Total: 4  COWS:  COWS Total  Score: 4   Review of Systems:  Neurological: The patient denies any headaches today. She denies any seizures or dizziness.  G.I.: The patient denies any constipation or G.I. Upset today.  Musculoskeletal: The patient reports significant back pain today.   Time was spent today discussing with the patient her current symptoms. The patient states that she is continuing to sleep well at night . She reports that her mood is improving with times and meds seem to help. No new acute problems.  Treatment Plan Summary:  1. Daily contact with patient to assess and evaluate symptoms and progress in treatment.  2. Medication management  3. The patient will deny suicidal ideations or homicidal ideations for 48 hours prior to discharge and have a depression and anxiety rating of 3 or less. The patient will also deny any auditory or visual hallucinations or delusional thinking.  4. The patient will deny any symptoms of substance withdrawal at time of discharge.   Plan:  1. Will continue the medication Celexa at 40 mgs po q am for depression and anxiety.  2. Will continue the medication Risperdal at 4 mg po qhs for psychosis.  3. Will continue the medication Neurontin at 300 mgs po q am, 2 pm and hs for pain and anxiety.  4. Will continue Trazodone at 100 mgs po qhs - prn for sleep.  5. Will continue the medication Mobic at 15 mgs po q am for back pain.  6. Will continue the medication Wellbutrin SR at 150 mgs po q am to also address her depressive symptoms. 7. Will continue the patient on the non-psychiatric medications as listed above.  8.  Patient's case manager is working with Vesta Mixer to see how this can be done on regular basis outpatient. This is essential to keep patient medically and psychiatrically stable outpatient   Wonda Cerise 05/06/2012, 6:30 PM

## 2012-05-06 NOTE — Progress Notes (Signed)
[facility] Group Notes:  (Counselor/Nursing/MHT/Case Management/Adjunct)  05/06/2012 10:32 PM  Type of Therapy:  Psychoeducational Skills  Participation Level:  Active  Participation Quality:  Appropriate  Affect:  Appropriate  Cognitive:  Appropriate  Insight:  Good  Engagement in Group:  Good  Engagement in Therapy:  Good  Modes of Intervention:  Support  Summary of Progress/Problems: Pt attended the evening Wrap-Up Group where she expressed gratitude and praise for the daytime MHTs for helping her with her needs. Pt reported that she was in a very good mood after getting to speak with her granddaughter on the phone.   Christ Kick 05/06/2012, 10:32 PM

## 2012-05-06 NOTE — Progress Notes (Addendum)
Patient ID: Danielle [name], female   DOB: [birth year], 61 y.o.   MRN: [id number] 05-06-12 @ 1223 pm nursing shift note: D: pt has had substance abuse/dependence problems, non compliance and depression. A: rn stiimulated pt in group to talk, disclose and attempt to change her behaviors. R: she stated she gets angry and that is when she uses drugs. rn and group encouraged her to learn to cope with her feelings in a healthy way. She said she will " work on it".Marland Kitchen

## 2012-05-07 NOTE — Progress Notes (Signed)
Patient ID: Danielle [name], female   DOB: May 24, 1951, 61 y.o.   MRN: [id number] 05-07-12 @ 1417: D: pt has been very visible and proactive in the milieu today. A: staff has praised this pt and commended her on her participation and her involvement in the milieu. R: she has shown a sense of pride and accomplishment. rn will monitor and q 15 min cks continue.

## 2012-05-07 NOTE — Progress Notes (Signed)
Patient ID: Danielle [name], female   DOB: 26-Nov-1950, 61 y.o.   MRN: [id number]  [facility] Group Notes:  (Counselor/Nursing/MHT/Case Management/Adjunct)  05/07/2012 11 AM  Type of Therapy:  Aftercare Planning, Group Therapy, Dance/Movement Therapy   Participation Level:  Minimal  Participation Quality:  Appropriate  Affect:  Appropriat  Cognitive:  Alert and Delusional  Insight: Limted  Engagement in Group:  Limited  Engagement in Therapy:  Limited  Modes of Intervention:  Clarification, Problem-solving, Role-play, Socialization and Support  Summary of Progress/Problems: After Care: Pt. attended and participated in aftercare planning group. Pt. accepted information on suicide prevention, warning signs to look for with suicide and crisis line numbers to use. The pt. agreed to call crisis line numbers if having warning signs or having thoughts of suicide. Pt. listed their current mood as feeling good. Counseling:  Therapist discussed the importance of supports and the reason why supports are needed. Therapist discussed the importance of recognizing supports that are good or bad. Therapist asked group to define the term support and what does support mean to them Therapist asked group to name at least five supports in their lives. Therapist asked group what can you do to support yourself when there are no supports available. Patient stated" my family is my support. I can mediate".  Pt. talked about hearing demons sometimes but did not hear them in group today.  When she's not hearing them she stated that she meditates.     [name] 05/07/2012. 3:07 PM

## 2012-05-07 NOTE — Progress Notes (Signed)
Psychoeducational Group Note  Date:  05/07/2012 Time:  1000am  Group Topic/Focus:  Making Healthy Choices:   The focus of this group is to help patients identify negative/unhealthy choices they were using prior to admission and identify positive/healthier coping strategies to replace them upon discharge.  Participation Level:  Active  Participation Quality:  Appropriate  Affect:  Blunted  Cognitive:  Oriented  Insight:  Good  Engagement in Group:  Good  Additional Comments:    Danielle [name] 05/07/2012,10:25 AM

## 2012-05-08 MED ORDER — CLONIDINE HCL 0.2 MG PO TABS: 0.2000 mg | ORAL_TABLET | Freq: Two times a day (BID) | ORAL | Status: DC

## 2012-05-08 MED ORDER — MELOXICAM 15 MG PO TABS: 15.0000 mg | ORAL_TABLET | Freq: Every day | ORAL | Status: DC

## 2012-05-08 MED ORDER — PANTOPRAZOLE SODIUM 20 MG PO TBEC: 20.0000 mg | DELAYED_RELEASE_TABLET | Freq: Every day | ORAL | Status: DC

## 2012-05-08 MED ORDER — GABAPENTIN 300 MG PO CAPS: 300.0000 mg | ORAL_CAPSULE | ORAL | Status: DC

## 2012-05-08 MED ORDER — RISPERIDONE 4 MG PO TABS: 4.0000 mg | ORAL_TABLET | Freq: Every day | ORAL | Status: DC

## 2012-05-08 MED ORDER — BUPROPION HCL ER (SR) 150 MG PO TB12: 150.0000 mg | ORAL_TABLET | Freq: Every day | ORAL | Status: DC

## 2012-05-08 MED ORDER — TRAZODONE HCL 100 MG PO TABS: 100.0000 mg | ORAL_TABLET | Freq: Every evening | ORAL | Status: DC | PRN

## 2012-05-08 MED ORDER — LIDOCAINE 5 % EX PTCH: 1.0000 | MEDICATED_PATCH | CUTANEOUS | Status: DC

## 2012-05-08 MED ORDER — CITALOPRAM HYDROBROMIDE 40 MG PO TABS: 40.0000 mg | ORAL_TABLET | Freq: Every day | ORAL | Status: DC

## 2012-05-08 NOTE — Progress Notes (Signed)
Patient ID: Brion Hedges, female   DOB: 1950/11/12, 61 y.o.   MRN: [id number] Patient ID: Taria Castrillo, female   DOB: Jun 28, 1951, 61 y.o.   MRN: [id number] [facility] MD Progress Note  05/08/2012 9:29 AM  Diagnosis:  Axis I: Schizophrenia - Paranoid Type.  Substance Induced Mood Disorder.  Cocaine Abuse.  Cannabis Abuse.     ADL's: Intact.  Sleep: The patient reports to sleeping well again last night  Appetite: The patient reports that her appetite is good.   Mild>(1-10) >Severe  Hopelessness (1-10): 4 Depression (1-10): 4 Anxiety (1-10): 4  Suicidal Ideation: The patient denies any suicidal ideations today.  Plan: No  Intent: No  Means: No   Homicidal Ideation: The patient denies any homicidal ideations.  Plan: No  Intent: No.  Means: No   General Appearance/Behavior: The patient remains friendly Eye Contact: Good.  Speech: Appropriate in rate, tone, volume and spontaneous Motor Behavior: wnl.  Level of Consciousness: Alert and Oriented x 3.  Mental Status: Alert and Oriented x 3.  Mood: no significant depression reported.  Affect: Appears more interactive but still constricted  Anxiety Level: Moderate anxiety reported.  Thought Process: wnl.  Thought Content: The patient denies any auditory or visual hallucinations today. She also denies any delusional thinking.  Perception: wnl.  Judgment: Fair to Good.  Insight: Fair to Good.  Cognition: Oriented to person, place and time.  Sleep:  Number of Hours: 6.5    Vital Signs:Blood pressure 148/86, pulse 87, temperature 97 F (36.1 C), temperature source Oral, resp. rate 17, height 5\' 1"  (1.549 m).  Current Medications: Current Facility-Administered Medications  Medication Dose Route Frequency Provider Last Rate Last Dose  . acetaminophen (TYLENOL) tablet 650 mg  650 mg Oral Q6H PRN Verne Spurr, PA-C   650 mg at 05/04/12 0809  . alum & mag hydroxide-simeth (MAALOX/MYLANTA) 200-200-20 MG/5ML suspension 30 mL  30 mL  Oral Q4H PRN Verne Spurr, PA-C      . buPROPion [facility] SR) 12 hr tablet 150 mg  150 mg Oral Q breakfast Curlene Labrum Readling, MD   150 mg at 05/08/12 0810  . citalopram (CELEXA) tablet 40 mg  40 mg Oral Daily Curlene Labrum Readling, MD   40 mg at 05/08/12 0811  . cloNIDine (CATAPRES) tablet 0.2 mg  0.2 mg Oral BID Verne Spurr, PA-C   0.2 mg at 05/08/12 7829  . gabapentin (NEURONTIN) capsule 300 mg  300 mg Oral [id number] Curlene Labrum Readling, MD   300 mg at 05/08/12 0811  . lidocaine (LIDODERM) 5 % 1 patch  1 patch Transdermal Q24H Ronny Bacon, MD   1 patch at 05/07/12 2230  . loperamide (IMODIUM) capsule 2 mg  2 mg Oral PRN Alyson Kuroski-Mazzei, DO      . magnesium hydroxide (MILK OF MAGNESIA) suspension 30 mL  30 mL Oral Daily PRN Verne Spurr, PA-C      . meloxicam (MOBIC) tablet 15 mg  15 mg Oral Daily Curlene Labrum Readling, MD   15 mg at 05/08/12 0811  . pantoprazole (PROTONIX) EC tablet 20 mg  20 mg Oral Q1200 Curlene Labrum Readling, MD   20 mg at 05/07/12 1129  . risperiDONE (RISPERDAL) tablet 4 mg  4 mg Oral QHS Curlene Labrum Readling, MD   4 mg at 05/07/12 2115  . traZODone (DESYREL) tablet 100 mg  100 mg Oral QHS PRN Ronny Bacon, MD   100 mg at 04/28/12 2155   Lab Results: No results found for  this or any previous visit (from the past 48 hour(s)).  Physical Findings: AIMS: Facial and Oral Movements Muscles of Facial Expression: None, normal Lips and Perioral Area: None, normal Jaw: None, normal Tongue: None, normal,Extremity Movements Upper (arms, wrists, hands, fingers): None, normal Lower (legs, knees, ankles, toes): None, normal, Trunk Movements Neck, shoulders, hips: None, normal, Overall Severity Severity of abnormal movements (highest score from questions above): None, normal Incapacitation due to abnormal movements: None, normal Patient's awareness of abnormal movements (rate only patient's report): No Awareness, Dental Status Current problems with teeth and/or dentures?: No Does  patient usually wear dentures?: No  CIWA:  CIWA-Ar Total: 4  COWS:  COWS Total Score: 4   Review of Systems:  Neurological: The patient denies any headaches today. She denies any seizures or dizziness.  G.I.: The patient denies any constipation or G.I. Upset today.  Musculoskeletal: The patient reports significant back pain today.   Time was spent on 7/21 discussing with the patient her current symptoms. The patient states that she is continuing to sleep well at night . She reports that her mood is improving with times and meds seem to help. No new acute problems. Thinks she is hoping to get discharge soon now.  Treatment Plan Summary:  1. Daily contact with patient to assess and evaluate symptoms and progress in treatment.  2. Medication management  3. The patient will deny suicidal ideations or homicidal ideations for 48 hours prior to discharge and have a depression and anxiety rating of 3 or less. The patient will also deny any auditory or visual hallucinations or delusional thinking.  4. The patient will deny any symptoms of substance withdrawal at time of discharge.   Plan:  1. Will continue the medication Celexa at 40 mgs po q am for depression and anxiety.  2. Will continue the medication Risperdal at 4 mg po qhs for psychosis.  3. Will continue the medication Neurontin at 300 mgs po q am, 2 pm and hs for pain and anxiety.  4. Will continue Trazodone at 100 mgs po qhs - prn for sleep.  5. Will continue the medication Mobic at 15 mgs po q am for back pain.  6. Will continue the medication Wellbutrin SR at 150 mgs po q am to also address her depressive symptoms. 7. Will continue the patient on the non-psychiatric medications as listed above.  8.  Patient's case manager is working with Vesta Mixer to see how this can be done on regular basis outpatient. This is essential to keep patient medically and psychiatrically stable outpatient   Wonda Cerise 05/08/2012, 9:29 AM

## 2012-05-08 NOTE — Progress Notes (Signed)
[facility] Group Notes:  (Counselor/Nursing/MHT/Case Management/Adjunct)  05/08/2012 3:12 PM  Type of Therapy:  Group Therapy  Participation Level:  Active  Participation Quality:  Attentive  Affect:  Blunted  Cognitive:  Oriented  Insight:  Good  Engagement in Group:  Good  Engagement in Therapy:  Good  Modes of Intervention:  Education and Support  Summary of Progress/Problems: Patient stated that her mind is on going home. She stated that she plans on trying to stay clean. She wants to go with her daughter to AA and NA and her daughter has also worked out transportation with another man to also take her to some of the meetings. She also plans to take her medications. Plans on being picked up by her cousin and landlord. She is looking forward to going to her new place.   Javen Ridings, Aram Beecham 05/08/2012, 3:12 PM

## 2012-05-08 NOTE — Progress Notes (Signed)
[facility] Case Management Discharge Plan:  Will you be returning to the same living situation after discharge: No.  Moving to new rooming house At discharge, do you have transportation home?:Yes,   Landlady/cousin to transport Do you have the ability to pay for your medications:Yes,  insurance and income  Interagency Information:     Release of information consent forms completed and in the chart;  Patient's signature needed at discharge.  Patient to Follow up at:  Follow-up Information    Follow up with Dr. Ladona Ridgel on 05/09/2012. (4:30PM appointment - At this appointment, ASK THE DOCTOR'S NURSE to help you fix your pillboxes weekly or every other week.)    Contact information:   [facility] [address] [place]  [address] Telephone:  [phone]         Patient denies SI/HI:   Yes,      Safety Planning and Suicide Prevention discussed:  Yes,    During Aftercare Planning Groups, Case Manager provided psychoeducation on "Suicide Prevention Information."  This included descriptions of risk factors for suicide, warning signs that an individual is in crisis and thinking of suicide, and what to do if this occurs.  Pt indicated understanding of information provided, and will read brochure given upon discharge.     Barrier to discharge identified:No.  Summary and Recommendations:  Follow up with [facility] for medication management.  Talk with doctor's RN about filling your med box weekly or every other week.  Purchase pill boxes for this purpose.   Sarina Ser 05/08/2012, 1:28 PM

## 2012-05-08 NOTE — [facility] Suicide Risk Assessment (Signed)
Suicide Risk Assessment  Discharge Assessment     Demographic factors:  Low socioeconomic status;Living alone;Unemployed    Current Mental Status Per Nursing Assessment::   On Admission:  Suicidal ideation indicated by patient;Self-harm thoughts At Discharge:     Current Mental Status Per Physician:The patient was seen and reports the following:   ADL's: Intact.  Sleep: The patient reports to sleeping well  Appetite: The patient reports that her appetite is good   Suicidal Ideation: The patient denies any suicidal ideations Plan: No  Intent: No  Means: No   Homicidal Ideation: The patient denies any homicidal ideations.  Plan: No  Intent: No.  Means: No   General Appearance/Behavior: The patient was friendly and cooperative today with this provider.  Eye Contact: Good.  Speech: Appropriate in rate and volume and spontaneous Motor Behavior: wnl.  Level of Consciousness: Alert and Oriented x 3.  Mental Status: Alert and Oriented x 3.  Mood: Patient reports that her mood is good and on a scale of 0-10 reports a 2 in regards to depression and 2 in regards to anxiety with 10 being the best  Affect: Appears bright and full  Anxiety Level: No anxiety  Thought Process: The patient's thought processes are organized and goal directed Thought Content: The patient denies any suicidal ideation homicidal ideation any delusions or paranoia Perception:  None reported Judgment: Fair to Good.  Insight: Fair to Good.  Cognition: Oriented to person, place and time.   Loss Factors: Decline in physical health;Financial problems / change in socioeconomic status  Historical Factors: Family history of mental illness or substance abuse  Risk Reduction Factors:     patient has a positive outlook of life  Continued Clinical Symptoms:  Schizophrenia:   Paranoid or undifferentiated type  Discharge Diagnoses:   AXIS I:  Schizophrenia-paranoid type AXIS II:  Deferred AXIS III:   Past  Medical History  Diagnosis Date  . Hypertension   . Asthma   . Depression   . Bipolar affective disorder, currently depressed, mild   . Stroke   . Coronary artery disease    AXIS IV:  Some cognitive issues AXIS V:  51-60 moderate symptoms  Cognitive Features That Contribute To Risk:  Patient has some cognitive issues as she struggles with filling her pillbox. Patient is to followup  At [facility] and the nurse there is to help patient fill her pillbox every week   Suicide Risk:  Minimal: No identifiable suicidal ideation.  Patients presenting with no risk factors but with morbid ruminations; may be classified as minimal risk based on the severity of the depressive symptoms  Plan Of Care/Follow-up recommendations:  Activity:  As tolerated Diet:  Low-sodium heart healthy diet Other:  Patient to followup with Dr. Ladona Ridgel and his nurse  Nelly Rout 05/08/2012, 12:51 PM

## 2012-05-08 NOTE — Discharge Summary (Signed)
Physician Discharge Summary Note  Patient:  Danielle [name] is an 61 y.o., female MRN:  [id number] DOB:  07-04-1951 Patient phone:  [phone] (home)  Patient address:   [address] [place] [address]   Date of Admission:  04/27/2012 Date of Discharge: 05/08/2012   Discharge Diagnoses: Principal Problem:  *Schizophrenia, paranoid type Active Problems:  Cocaine abuse  Cannabis abuse  Substance induced mood disorder  Axis Diagnosis:  Discharge Diagnoses:  AXIS I: Schizophrenia-paranoid type, cocaine abuse AXIS II: Deferred  AXIS III:  Past Medical History   Diagnosis  Date   .  Hypertension    .  Asthma    .  Depression    .  Bipolar affective disorder, currently depressed, mild    .  Stroke    .  Coronary artery disease     AXIS IV: Some cognitive issues  AXIS V: 51-60 moderate symptoms      Level of Care:  OP  Hospital Course:       Mykaela was admitted for suicidal ideation and relapse on cocaine.  She was admitted to [facility] after being medically cleared.  Her symptoms of depression, anxiety, mood swings, sadness, with auditory and visual hallucinations with suicidal ideation were treated with Resperidone titrated to 4mg  at bedtime.  Depression and anxiety were treated with Welbutrin and Celexa.  Anxiety and pain were treated with Neurontin and meloxicam.  For sleep she was given Trazodone.      [name]'s medical problems were treated accordingly as indicated and her blood pressure was treated with Clonidine at her usual doses of 0.2mg  BID.  For pain she was also given Lidocaine patches and Meloxicam.  Her GERD was treated with protonix.           Her response to treatment was monitored with daily evaluations by a clinical provider, and she was also asked to complete a daily self inventory regarding her mental and emotional status.  [name] was also asked to participate in groups on the unit as well as with individual meetings with a therapist. She was cooperative  with attending groups, and interacted well with other patients in the unit milieu.         [name] also met with the treatment team for discussion of her plan for continued recovery upon discharge.  She expressed interest in an ACT team but she does not meet criteria for those services. Her symptoms resolved accordingly and she was stable for discharge.       [name] endorsed that their symptoms have improved. Pt also stated that she was stable for discharge.     Follow-up Information    Follow up with Dr. Ladona Ridgel. Schedule an appointment as soon as possible for a visit on 05/09/2012. (4:30PM appointment - At this appointment, ASK THE DOCTOR'S NURSE to help you fix your pillboxes weekly or every other week.)    Contact information:   [facility] [address] [place]  [address] Telephone:  [phone]         Upon discharge, patient adamantly denies suicidal, homicidal ideations, auditory, visual hallucinations and or delusional thinking. They left [facility] with all personal belongings via previously arranged transportation in no apparent distress.  Consults:  None  Significant Diagnostic Studies:  None  Discharge Vitals:   Blood pressure 148/86, pulse 87, temperature 97 F (36.1 C), temperature source Oral, resp. rate 17, height 5\' 1"  (1.549 m)..  Mental Status Exam: See Mental Status Examination and Suicide Risk Assessment completed  by Attending Physician prior to discharge.  Discharge destination:  Home  Is patient on multiple antipsychotic therapies at discharge:  No  Has Patient had three or more failed trials of antipsychotic monotherapy by history: N/A Recommended Plan for Multiple Antipsychotic Therapies: N/A Discharge Orders    Future Orders Please Complete By Expires   Diet - low sodium heart healthy      Increase activity slowly      Discharge instructions      Comments:   Take all of your medications as prescribed.  Be sure to keep ALL follow up appointments as  scheduled. This is to ensure getting your refills on time to avoid any interruption in your medication.  If you find that you can not keep your appointment, call the clinic and reschedule. Be sure to tell the nurse if you will need a refill before your appointment.     Medication List  As of 05/08/2012 10:13 AM   STOP taking these medications         ALPRAZolam 0.5 MG tablet      HYDROcodone-acetaminophen 7.5-500 MG per tablet         TAKE these medications      Indication    buPROPion 150 MG 12 hr tablet   Commonly known as: WELLBUTRIN SR   Take 1 tablet (150 mg total) by mouth daily with breakfast. For depression.    Indication: Major Depressive Disorder      citalopram 40 MG tablet   Commonly known as: CELEXA   Take 1 tablet (40 mg total) by mouth daily. For depression and anxiety.    Indication: Depression      cloNIDine 0.2 MG tablet   Commonly known as: CATAPRES   Take 1 tablet (0.2 mg total) by mouth 2 (two) times daily. For high blood pressure.    Indication: High Blood Pressure      gabapentin 300 MG capsule   Commonly known as: NEURONTIN   Take 1 capsule (300 mg total) by mouth 3 (three) times daily at 8am, 2pm and bedtime. For anxiety and pain.    Indication: Neuropathic Pain, Pain      lidocaine 5 %   Commonly known as: LIDODERM   Place 1 patch onto the skin daily. Remove & Discard patch within 12 hours or as directed by MD for pain.       meloxicam 15 MG tablet   Commonly known as: MOBIC   Take 1 tablet (15 mg total) by mouth daily. For pain.    Indication: Joint Damage causing Pain and Loss of Function      pantoprazole 20 MG tablet   Commonly known as: PROTONIX   Take 1 tablet (20 mg total) by mouth daily at 12 noon. For reflux.    Indication: Gastroesophageal Reflux Disease      risperidone 4 MG tablet   Commonly known as: RISPERDAL   Take 1 tablet (4 mg total) by mouth at bedtime. For mental clarity and psychosis.       traZODone 100 MG tablet    Commonly known as: DESYREL   Take 1 tablet (100 mg total) by mouth at bedtime as needed for sleep.    Indication: Trouble Sleeping           Follow-up Information    Follow up with Dr. Ladona Ridgel. Schedule an appointment as soon as possible for a visit on 05/09/2012. (4:30PM appointment - At this appointment, ASK THE DOCTOR'S NURSE to help you fix  your pillboxes weekly or every other week.)    Contact information:   [facility] [address] [place]  [address] Telephone:  [phone]        Follow-up recommendations:   Activities: Resume typical activities Diet: Resume typical diet Other: Follow up with outpatient provider and report any side effects to out patient prescriber.  Comments:  Take all your medications as prescribed by your mental healthcare provider. Report any adverse effects and or reactions from your medicines to your outpatient provider promptly. Patient is instructed and cautioned to not engage in alcohol and or illegal drug use while on prescription medicines. In the event of worsening symptoms, patient is instructed to call the crisis hotline, 911 and or go to the nearest ED for appropriate evaluation and treatment of symptoms.  Signed: Rona Ravens. Stylianos Stradling PAC For Dr. Nelly Rout 05/08/2012 10:13 AM

## 2012-05-08 NOTE — Tx Team (Signed)
Interdisciplinary Treatment Plan Update (Adult)  Date:  05/08/2012  Time Reviewed:  10:15AM-11:15AM  Progress in Treatment: Attending groups:  Yes Participating in groups:    Yes Taking medication as prescribed:    Yes Tolerating medication:   Yes Family/Significant other contact made:  Yes Patient understands diagnosis:   Yes Discussing patient identified problems/goals with staff:   Yes Medical problems stabilized or resolved:   Yes Denies suicidal/homicidal ideation:  Yes Issues/concerns per patient self-inventory:   None Other:    New problem(s) identified: No, Describe:    Reason for Continuation of Hospitalization: None  Interventions implemented related to continuation of hospitalization:  Medication monitoring and adjustment, safety checks Q15 min., suicide risk assessment, group therapy, psychoeducation, collateral contact, aftercare planning, ongoing physician assessments, medication education - UNTIL DISCHARGE  Additional comments:  Not applicable  Estimated length of stay:  Discharge today  Discharge Plan:  Go to live at new place arranged by landlady, follow up with [facility].  Bus passes given to get to [facility] appointment & home.  New goal(s):  Not applicable  Review of initial/current patient goals per problem list:   1.  Goal(s):  Medication stabilization   Met:  Yes  Target date:  By Discharge   As evidenced by:  Stable for discharge  2.  Goal(s):  Return sleep to normal level of 6+ hours nightly.   Met:  Yes  Target date:  By Discharge   As evidenced by:  Sleep is normalized  3.  Goal(s):  Reduce auditory hallucinations (command type) to baseline.   Met:  Yes  Target date:  By Discharge   As evidenced by:  At baseline  4.  Goal(s):  Deny SI for 48 hours prior to D/C.   Met:  Yes  Target date:  By Discharge   As evidenced by:  Denies all SI  5. Goal(s): Determine if & how to address substance abuse issues at discharge.  Met:  Yes Target date: By Discharge  As evidenced by: Deferred, as patient continues to deny  6. Goal(s): Patient would like help with getting someone to come to her house to oversee medications.  Met: Yes Target date: By Discharge  As evidenced by:  Patient will speak with Dr. Lubertha Basque RN during her appointment about filling her pill box.  7. Goal(s): Return anxiety to no greater than 3 at discharge.  Met: Yes  Target date: By Discharge  As evidenced by: Denies anxiety today  Attendees: Patient:  Danielle [name]  05/08/2012 10:15AM-11:15AM  Family:     Physician:  Dr. Nelly Rout 05/08/2012 10:15AM-11:15AM  Nursing:   Joslyn Devon, RN 05/08/2012 10:15AM -11:15AM   Case Manager:  Ambrose Mantle, LCSW 05/08/2012 10:15AM-11:15AM  Counselor:  Veto Kemps, MT-BC 05/08/2012 10:15AM-11:15AM  Other:   Neill Loft, RN 05/08/2012   Other:      Other:      Other:       Scribe for Treatment Team:   Sarina Ser, 05/08/2012, 10:15AM-11:15AM

## 2012-05-08 NOTE — Therapy (Signed)
Psychoeducational Group Note  Date:  05/07/2012 Time:  2005  Group Topic/Focus:  Wrap-Up Group:   The focus of this group is to help patients review their daily goal of treatment and discuss progress on daily workbooks.  Participation Level:  Active  Participation Quality:  Supportive  Affect:  Anxious and Appropriate  Cognitive:  Appropriate  Insight:  Good  Engagement in Group:  Good  Additional Comments:   Patient attended and participated in wrap-up group this evening. Patient shared that she was feeling a lot of pressure because she did not know what was going to happen or where she would go or what she would do after discharge.  Aidynn Krenn, Newton Pigg 05/08/2012, 2:55 AM

## 2012-05-08 NOTE — Progress Notes (Signed)
Patient ID: Danielle [name], female   DOB: [birth year], 61 y.o.   MRN: [id number] Patient was discharged ambulatory and rode home with her landlady.  She denies SI/HI.  She verbalizes understanding of d/c meds and followup.  Pt says she can go to drugstore and buy a pill box to take to her appointment tomorrow and get help loading her medications.  She was able to vebalized when they should be taken.

## 2012-05-08 NOTE — Progress Notes (Signed)
05/08/2012         Time: 0930      Group Topic/Focus: The focus of this group is on enhancing the patient's understanding of leisure, barriers to leisure, and the importance of engaging in positive leisure activities upon discharge for improved total health.  Participation Level: Active  Participation Quality: Appropriate and Attentive  Affect: Appropriate  Cognitive: Oriented   Additional Comments: Patient very focused on discharging today, was able to identify leisure interests she can engage in upon discharge.   Danielle [name] 05/08/2012 11:55 AM

## 2012-05-09 NOTE — Discharge Summary (Signed)
Agree with above 

## 2012-05-10 NOTE — Progress Notes (Signed)
Patient Discharge Instructions:  After Visit Summary (AVS):   Faxed to:  05/09/2012 Psychiatric Admission Assessment Note:   Faxed to:  05/09/2012 Suicide Risk Assessment - Discharge Assessment:   Faxed to:  05/09/2012 Faxed/Sent to the Next Level Care provider:  05/09/2012  Faxed to [facility] - Dr. Ladona Ridgel @ [phone]  Wandra Scot, 05/10/2012, 5:56 PM

## 2012-05-17 ENCOUNTER — Emergency Department (HOSPITAL_COMMUNITY)
Admission: EM | Admit: 2012-05-17 | Discharge: 2012-05-17 | Disposition: A | Discharge status: Home / Self Care | Payer: Medicare Other | Attending: Emergency Medicine | Admitting: Emergency Medicine

## 2012-05-17 ENCOUNTER — Encounter (HOSPITAL_COMMUNITY): Payer: Self-pay | Admitting: *Deleted

## 2012-05-17 DIAGNOSIS — G249 Dystonia, unspecified: Secondary | ICD-10-CM

## 2012-05-17 DIAGNOSIS — R279 Unspecified lack of coordination: Secondary | ICD-10-CM | POA: Insufficient documentation

## 2012-05-17 DIAGNOSIS — I2510 Atherosclerotic heart disease of native coronary artery without angina pectoris: Secondary | ICD-10-CM | POA: Insufficient documentation

## 2012-05-17 DIAGNOSIS — F17200 Nicotine dependence, unspecified, uncomplicated: Secondary | ICD-10-CM | POA: Insufficient documentation

## 2012-05-17 DIAGNOSIS — J45909 Unspecified asthma, uncomplicated: Secondary | ICD-10-CM | POA: Insufficient documentation

## 2012-05-17 DIAGNOSIS — Z882 Allergy status to sulfonamides status: Secondary | ICD-10-CM | POA: Insufficient documentation

## 2012-05-17 DIAGNOSIS — F319 Bipolar disorder, unspecified: Secondary | ICD-10-CM | POA: Insufficient documentation

## 2012-05-17 DIAGNOSIS — Z880 Allergy status to penicillin: Secondary | ICD-10-CM | POA: Insufficient documentation

## 2012-05-17 DIAGNOSIS — I10 Essential (primary) hypertension: Secondary | ICD-10-CM | POA: Insufficient documentation

## 2012-05-17 LAB — POCT I-STAT, CHEM 8
BUN: 24 mg/dL — ABNORMAL HIGH (ref 6–23)
Calcium, Ion: 1.26 mmol/L — ABNORMAL HIGH (ref 1.12–1.23)
Chloride: 104 meq/L (ref 96–112)
Creatinine, Ser: 1 mg/dL (ref 0.50–1.10)
Glucose, Bld: 98 mg/dL (ref 70–99)
HCT: 46 % (ref 36.0–46.0)
Hemoglobin: 15.6 g/dL — ABNORMAL HIGH (ref 12.0–15.0)
Potassium: 4.1 meq/L (ref 3.5–5.1)
Sodium: 138 meq/L (ref 135–145)
TCO2: 24 mmol/L (ref 0–100)

## 2012-05-17 MED ORDER — DIPHENHYDRAMINE HCL 50 MG/ML IJ SOLN
50.0000 mg | Freq: Once | INTRAMUSCULAR | Status: AC
  Administered 2012-05-17: 50 mg via INTRAVENOUS
  Filled 2012-05-17: qty 1

## 2012-05-17 MED ORDER — DIPHENHYDRAMINE HCL 25 MG PO CAPS: 25.0000 mg | ORAL_CAPSULE | Freq: Four times a day (QID) | ORAL | Status: DC | PRN

## 2012-05-17 NOTE — Progress Notes (Signed)
The only practice in [place] accepting new Medicare patients is the [facility]. I have called the practice and they are closed. I have provided the patient with the phone number and instructed her to contact them tomorrow to make an appt.

## 2012-05-17 NOTE — ED Notes (Signed)
Pt states she has been having a panic attack for the past 4 days. Pt is difficult to understand. States she is on new medicine.

## 2012-05-17 NOTE — ED Notes (Signed)
Ann, Case Management at bedside.

## 2012-05-17 NOTE — Progress Notes (Signed)
Consulted to obtain new PCP for patient. As patient has Medicare she can choose her provider. She has been placed in touch with [facility] to obtain a referral to physician accepting Medicare.

## 2012-05-17 NOTE — ED Notes (Signed)
Pt undressed, in gown, on monitor, continuous pulse oximetry, blood pressure cuff and oxygen [place] (2L) 

## 2012-05-17 NOTE — ED Provider Notes (Signed)
History     CSN: [id number]  Arrival date & time 05/17/12  3086   First MD Initiated Contact with Patient 05/17/12 1047      Chief Complaint  Patient presents with  . Panic Attack    (Consider location/radiation/quality/duration/timing/severity/associated sxs/prior treatment) HPI  Pt presents to the ER with complaints of dyskinesias for 4 days. The patient informs me that she has been getting them for the past 7 years of her life and using Xanax to alleviate them. During her last stay at behavioral health, for cocaine abuse, the patient states they discontinued her Xanax and initiated Trazodone and Neurontin. They patient states that they have not been working and she is still having the dyskinesias without relief as she does not have any Xanax. Pt vital signs are stable and she is in nad.    Past Medical History  Diagnosis Date  . Hypertension   . Asthma   . Depression   . Bipolar affective disorder, currently depressed, mild   . Stroke   . Coronary artery disease     Past Surgical History  Procedure Date  . Abdominal hysterectomy   . Cardiac catheterization     Family History  Problem Relation Age of Onset  . Hypertension Mother     History  Substance Use Topics  . Smoking status: Current Some Day Smoker    Types: Cigarettes  . Smokeless tobacco: Not on file  . Alcohol Use: Yes     occasionally    OB History    Grav Para Term Preterm Abortions TAB SAB Ect Mult Living                  Review of Systems   HEENT: denies blurry vision or change in hearing PULMONARY: Denies difficulty breathing and SOB CARDIAC: denies chest pain or heart palpitations ABDOMEN AL: denies abdominal pain GU: denies loss of bowel or urinary control NEURO: denies numbness and tingling in extremities SKIN: no new rashes PSYCH: patient has schizophrenia NECK: Pt denies having neck pain    Allergies  Doxycycline; Fosinopril sodium; Penicillins; and Sulfonamide  derivatives  Home Medications   Current Outpatient Rx  Name Route Sig Dispense Refill  . CITALOPRAM HYDROBROMIDE 40 MG PO TABS Oral Take 1 tablet (40 mg total) by mouth daily. For depression and anxiety. 30 tablet 0  . FLUTICASONE-SALMETEROL 100-50 MCG/DOSE IN AEPB Inhalation Inhale 1 puff into the lungs 2 (two) times daily as needed. As needed for shortness of breath/wheezing.    Marland Kitchen GABAPENTIN 400 MG PO CAPS Oral Take 400 mg by mouth 3 (three) times daily.    Marland Kitchen PANTOPRAZOLE SODIUM 20 MG PO TBEC Oral Take 1 tablet (20 mg total) by mouth daily at 12 noon. For reflux. 30 tablet 0  . TRAZODONE HCL 100 MG PO TABS Oral Take 100 mg by mouth at bedtime.      BP 131/72  Pulse 99  Temp 98.7 F (37.1 C) (Oral)  Resp 18  SpO2 100%  Physical Exam  Nursing note and vitals reviewed. Constitutional: She appears well-developed and well-nourished. No distress.  HENT:  Head: Normocephalic and atraumatic.  Eyes: Pupils are equal, round, and reactive to light.  Neck: Normal range of motion. Neck supple.       Pt having dyskinesias .  Cardiovascular: Normal rate and regular rhythm.   Pulmonary/Chest: Effort normal. No respiratory distress. She has no wheezes.  Abdominal: Soft. There is no tenderness. There is no rebound and no guarding.  Neurological: She is alert.  Skin: Skin is warm and dry.    ED Course  Procedures (including critical care time)  Labs Reviewed  POCT I-STAT, CHEM 8 - Abnormal; Notable for the following:    BUN 24 (*)     Calcium, Ion 1.26 (*)     Hemoglobin 15.6 (*)     All other components within normal limits   No results found.   1. Dyskinesia       MDM  Case Management consulted and Pt currently using [facility] provider [facility] contacted to help set up patient with primary care provider using her Medicare. Pt given 50mg  beandryl and dyskinesias stopped. I will have patient continue using her medications and advise her to take Benadryl as needed for her  symptoms.  Pt discussed with Dr. Rubin Payor.  Pt has been advised of the symptoms that warrant their return to the ED. Patient has voiced understanding and has agreed to follow-up with the PCP or specialist.         Dorthula Matas, PA 05/17/12 1506

## 2012-05-19 NOTE — ED Provider Notes (Signed)
Medical screening examination/treatment/procedure(s) were performed by non-physician practitioner and as supervising physician I was immediately available for consultation/collaboration.  Juliet Rude. Rubin Payor, MD 05/19/12 1051

## 2012-08-04 ENCOUNTER — Emergency Department (HOSPITAL_COMMUNITY)
Admission: EM | Admit: 2012-08-04 | Discharge: 2012-08-04 | Disposition: A | Discharge status: Home / Self Care | Payer: Medicare Other | Attending: Emergency Medicine | Admitting: Emergency Medicine

## 2012-08-04 ENCOUNTER — Encounter (HOSPITAL_COMMUNITY): Payer: Self-pay

## 2012-08-04 DIAGNOSIS — Z8249 Family history of ischemic heart disease and other diseases of the circulatory system: Secondary | ICD-10-CM | POA: Insufficient documentation

## 2012-08-04 DIAGNOSIS — J45909 Unspecified asthma, uncomplicated: Secondary | ICD-10-CM | POA: Insufficient documentation

## 2012-08-04 DIAGNOSIS — Z881 Allergy status to other antibiotic agents status: Secondary | ICD-10-CM | POA: Insufficient documentation

## 2012-08-04 DIAGNOSIS — T50905A Adverse effect of unspecified drugs, medicaments and biological substances, initial encounter: Secondary | ICD-10-CM

## 2012-08-04 DIAGNOSIS — I2510 Atherosclerotic heart disease of native coronary artery without angina pectoris: Secondary | ICD-10-CM | POA: Insufficient documentation

## 2012-08-04 DIAGNOSIS — Z8673 Personal history of transient ischemic attack (TIA), and cerebral infarction without residual deficits: Secondary | ICD-10-CM | POA: Insufficient documentation

## 2012-08-04 DIAGNOSIS — Z882 Allergy status to sulfonamides status: Secondary | ICD-10-CM | POA: Insufficient documentation

## 2012-08-04 DIAGNOSIS — L500 Allergic urticaria: Secondary | ICD-10-CM | POA: Insufficient documentation

## 2012-08-04 DIAGNOSIS — Z880 Allergy status to penicillin: Secondary | ICD-10-CM | POA: Insufficient documentation

## 2012-08-04 DIAGNOSIS — I10 Essential (primary) hypertension: Secondary | ICD-10-CM | POA: Insufficient documentation

## 2012-08-04 DIAGNOSIS — Z888 Allergy status to other drugs, medicaments and biological substances status: Secondary | ICD-10-CM | POA: Insufficient documentation

## 2012-08-04 DIAGNOSIS — Z87891 Personal history of nicotine dependence: Secondary | ICD-10-CM | POA: Insufficient documentation

## 2012-08-04 DIAGNOSIS — F319 Bipolar disorder, unspecified: Secondary | ICD-10-CM | POA: Insufficient documentation

## 2012-08-04 HISTORY — DX: Drug induced subacute dyskinesia: G24.01

## 2012-08-04 MED ORDER — PREDNISONE 20 MG PO TABS
60.0000 mg | ORAL_TABLET | Freq: Once | ORAL | Status: AC
  Administered 2012-08-04: 60 mg via ORAL
  Filled 2012-08-04: qty 3

## 2012-08-04 MED ORDER — DIPHENHYDRAMINE HCL 25 MG PO CAPS
25.0000 mg | ORAL_CAPSULE | Freq: Once | ORAL | Status: AC
  Administered 2012-08-04: 25 mg via ORAL
  Filled 2012-08-04: qty 1
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
## 2012-08-04 NOTE — ED Provider Notes (Signed)
I saw and evaluated the patient, reviewed the resident's note and I agree with the findings and plan.   .Face to face Exam:  General:  Awake HEENT:  Atraumatic Resp:  Normal effort Abd:  Nondistended Neuro:No focal weakness Lymph: No adenopathy   Nelia Shi, MD 08/04/12 2110

## 2012-08-04 NOTE — ED Provider Notes (Signed)
History     CSN: [id number]  Arrival date & time 08/04/12  1806   First MD Initiated Contact with Patient 08/04/12 1907      No chief complaint on file.   (Consider location/radiation/quality/duration/timing/severity/associated sxs/prior treatment) HPI CC: allergic reaction  Allergic reaction: Pt awoke this am w/ urticarial rash all over her body that was itchy and raised per pt. Rash did not improve w/ cortisone 10 cream and another prescription strength steroid ointment that she had at home. Nothing worsens the rash. Pt reports coming off of all of her medications for several months until 2 days ago when she was restarted on her HCTZ and Klonopin. Pt denies any new foods or environmental exposures. Pt has allergic reaction h/x to various other medications. Pt also reports one episode of dizziness when going from lying to sitting. Pt has had decreased PO over the past day or so. Denies any fever, n/v/d/c, loc.   Past Medical History  Diagnosis Date  . Hypertension   . Asthma   . Depression   . Bipolar affective disorder, currently depressed, mild   . Stroke   . Coronary artery disease   . Tardive dyskinesia     Past Surgical History  Procedure Date  . Abdominal hysterectomy   . Cardiac catheterization     Family History  Problem Relation Age of Onset  . Hypertension Mother     History  Substance Use Topics  . Smoking status: Former Smoker    Types: Cigarettes    Quit date: 07/13/2012  . Smokeless tobacco: Not on file  . Alcohol Use: Yes     occasionally    OB History    Grav Para Term Preterm Abortions TAB SAB Ect Mult Living                  Review of Systems  All other systems reviewed and are negative.    Allergies  Doxycycline; Fosinopril sodium; Penicillins; and Sulfonamide derivatives  Home Medications   Current Outpatient Rx  Name Route Sig Dispense Refill  . CITALOPRAM HYDROBROMIDE 40 MG PO TABS Oral Take 1 tablet (40 mg total) by mouth  daily. For depression and anxiety. 30 tablet 0  . DIPHENHYDRAMINE HCL 25 MG PO TABS Oral Take 25 mg by mouth every 6 (six) hours as needed. Allergic reaction    . HYDROCORTISONE 0.5 % EX CREA Topical Apply 1 application topically 3 (three) times daily.    . TRAZODONE HCL 100 MG PO TABS Oral Take 100 mg by mouth at bedtime.    Marland Kitchen DIPHENHYDRAMINE HCL 25 MG PO CAPS Oral Take 1 capsule (25 mg total) by mouth every 6 (six) hours as needed (dyskinesias ). 30 capsule 0  . TRAZODONE HCL 100 MG PO TABS Oral Take 100 mg by mouth at bedtime.      BP 113/86  Pulse 66  Temp 98.2 F (36.8 C) (Oral)  Resp 18  SpO2 100%  Physical Exam  Nursing note and vitals reviewed. Constitutional: She is oriented to person, place, and time. She appears well-developed and well-nourished. No distress.  HENT:  Head: Normocephalic and atraumatic.  Eyes: Pupils are equal, round, and reactive to light.  Neck: Normal range of motion.  Cardiovascular: Normal rate and intact distal pulses.   Pulmonary/Chest: Effort normal. No respiratory distress.  Abdominal: Normal appearance. She exhibits no distension.  Musculoskeletal: Normal range of motion.  Neurological: She is alert and oriented to person, place, and time.  Skin: Skin is  warm and dry. Rash noted.       sparce faint urticarial rash on trunk LE and UE.   Psychiatric: She has a normal mood and affect. Her behavior is normal.    ED Course  Procedures (including critical care time)  Labs Reviewed - No data to display No results found.   No diagnosis found.    MDM   61yo F w/ likely resolving drug reaction rash.  - Benadryl 25mg  - Prednisone 60mg  - Pt instructed to stop taking HCTZ and Klonopin until further eval by PCP - Handout given 8:24 PM    9:06 PM Pt symptoms much improved after Benadryl and Prednisone - Stable for DC - Benadryl already at home. Pt to continue PRN - Pt to f/u w/ PCP tomorrow '   Shelly Flatten, MD Family Medicine  PGY-2 08/04/2012, 9:07 PM      Ozella Rocks, MD 08/04/12 2107

## 2012-08-18 ENCOUNTER — Emergency Department (HOSPITAL_COMMUNITY)
Admission: EM | Admit: 2012-08-18 | Discharge: 2012-08-18 | Disposition: A | Discharge status: Home / Self Care | Payer: Medicare Other | Attending: Emergency Medicine | Admitting: Emergency Medicine

## 2012-08-18 DIAGNOSIS — F411 Generalized anxiety disorder: Secondary | ICD-10-CM | POA: Insufficient documentation

## 2012-08-18 DIAGNOSIS — Z8673 Personal history of transient ischemic attack (TIA), and cerebral infarction without residual deficits: Secondary | ICD-10-CM | POA: Insufficient documentation

## 2012-08-18 DIAGNOSIS — IMO0002 Reserved for concepts with insufficient information to code with codable children: Secondary | ICD-10-CM | POA: Insufficient documentation

## 2012-08-18 DIAGNOSIS — W010XXA Fall on same level from slipping, tripping and stumbling without subsequent striking against object, initial encounter: Secondary | ICD-10-CM | POA: Insufficient documentation

## 2012-08-18 DIAGNOSIS — Z87891 Personal history of nicotine dependence: Secondary | ICD-10-CM | POA: Insufficient documentation

## 2012-08-18 DIAGNOSIS — Y92009 Unspecified place in unspecified non-institutional (private) residence as the place of occurrence of the external cause: Secondary | ICD-10-CM | POA: Insufficient documentation

## 2012-08-18 DIAGNOSIS — Y939 Activity, unspecified: Secondary | ICD-10-CM | POA: Insufficient documentation

## 2012-08-18 DIAGNOSIS — F3289 Other specified depressive episodes: Secondary | ICD-10-CM | POA: Insufficient documentation

## 2012-08-18 DIAGNOSIS — F419 Anxiety disorder, unspecified: Secondary | ICD-10-CM

## 2012-08-18 DIAGNOSIS — J45909 Unspecified asthma, uncomplicated: Secondary | ICD-10-CM | POA: Insufficient documentation

## 2012-08-18 DIAGNOSIS — F319 Bipolar disorder, unspecified: Secondary | ICD-10-CM | POA: Insufficient documentation

## 2012-08-18 DIAGNOSIS — F329 Major depressive disorder, single episode, unspecified: Secondary | ICD-10-CM | POA: Insufficient documentation

## 2012-08-18 DIAGNOSIS — I10 Essential (primary) hypertension: Secondary | ICD-10-CM | POA: Insufficient documentation

## 2012-08-18 DIAGNOSIS — I2510 Atherosclerotic heart disease of native coronary artery without angina pectoris: Secondary | ICD-10-CM | POA: Insufficient documentation

## 2012-08-18 DIAGNOSIS — Z79899 Other long term (current) drug therapy: Secondary | ICD-10-CM | POA: Insufficient documentation

## 2012-08-18 MED ORDER — LORAZEPAM 0.5 MG PO TABS
0.5000 mg | ORAL_TABLET | Freq: Once | ORAL | Status: AC
  Administered 2012-08-18: 0.5 mg via ORAL
  Filled 2012-08-18: qty 1

## 2012-08-18 MED ORDER — NAPROXEN 500 MG PO TABS: 500.0000 mg | ORAL_TABLET | Freq: Two times a day (BID) | ORAL | Status: DC

## 2012-08-18 MED ORDER — LORAZEPAM 1 MG PO TABS: 1.0000 mg | ORAL_TABLET | Freq: Once | ORAL | Status: DC

## 2012-08-18 NOTE — ED Provider Notes (Signed)
History     CSN: [id number]  Arrival date & time 08/18/12  1738   First MD Initiated Contact with Patient 08/18/12 1901      Chief Complaint  Patient presents with  . Anxiety    (Consider location/radiation/quality/duration/timing/severity/associated sxs/prior treatment) Patient is a 61 y.o. female presenting with anxiety. The history is provided by the patient and medical records.  Anxiety This is a new problem. The current episode started in the past 7 days. Episode frequency: nightly. The problem has been waxing and waning. Pertinent negatives include no abdominal pain, chest pain, coughing, diaphoresis, fatigue, fever, headaches, nausea, rash or vomiting.    Danielle [name] is a 61 y.o. female presents c/o anxiety.  Pt states she just came from her brothers funeral and states that she cannot sleep.  She states increasing anxiety for the last 3 days.  Symptoms began gradually, have been persistent, gradually worsening.  She states that her brother died of a heart attack 3 days ago and they buried him today. She states that she falls asleep and awakes at night unable to breathe, with pain in her sides, and with significant fear about his heart attack.  Patient currently has associated headache. She states yesterday she had one of these episodes at her daughter's house and she lost her balance and fell. She states that she has had low back pain since yesterday.  She states she knows nothing is broken and that she does not need an x-ray however it has contributed to her being unable to sleep.  Nothing makes her anxiety with better, taking about her brother makes her anxiety worse.  She has taken ibuprofen for her back with moderate relief.  Patient denies chest pain,, pain, nausea, vomiting, diarrhea, weakness, dizziness, syncopal episode.  Past Medical History  Diagnosis Date  . Hypertension   . Asthma   . Depression   . Bipolar affective disorder, currently depressed, mild   . Stroke    . Coronary artery disease   . Tardive dyskinesia     Past Surgical History  Procedure Date  . Abdominal hysterectomy   . Cardiac catheterization     Family History  Problem Relation Age of Onset  . Hypertension Mother     History  Substance Use Topics  . Smoking status: Former Smoker    Types: Cigarettes    Quit date: 07/13/2012  . Smokeless tobacco: Not on file  . Alcohol Use: Yes     occasionally    OB History    Grav Para Term Preterm Abortions TAB SAB Ect Mult Living                  Review of Systems  Constitutional: Negative for fever, diaphoresis, appetite change, fatigue and unexpected weight change.  HENT: Negative for mouth sores and neck stiffness.        Mouth sores  Eyes: Negative for visual disturbance.  Respiratory: Negative for cough, chest tightness, shortness of breath and wheezing.   Cardiovascular: Negative for chest pain.  Gastrointestinal: Negative for nausea, vomiting, abdominal pain, diarrhea and constipation.  Genitourinary: Negative for dysuria, urgency, frequency and hematuria.  Musculoskeletal: Positive for back pain (lower).  Skin: Negative for rash.  Neurological: Negative for syncope, light-headedness and headaches.  Psychiatric/Behavioral: Negative for disturbed wake/sleep cycle. The patient is not nervous/anxious.   All other systems reviewed and are negative.    Allergies  Doxycycline; Fosinopril sodium; Penicillins; and Sulfonamide derivatives  Home Medications   Current Outpatient Rx  Name Route Sig Dispense Refill  . CITALOPRAM HYDROBROMIDE 40 MG PO TABS Oral Take 1 tablet (40 mg total) by mouth daily. For depression and anxiety. 30 tablet 0  . DIPHENHYDRAMINE HCL 25 MG PO TABS Oral Take 25 mg by mouth every 6 (six) hours as needed. Allergic reaction    . HYDROCORTISONE 0.5 % EX CREA Topical Apply 1 application topically 3 (three) times daily.    Marland Kitchen NAPROXEN 500 MG PO TABS Oral Take 1 tablet (500 mg total) by mouth 2  (two) times daily with a meal. 30 tablet 0  . TRAZODONE HCL 100 MG PO TABS Oral Take 100 mg by mouth at bedtime.      BP 166/113  Pulse 94  Temp 98.8 F (37.1 C)  Resp 17  SpO2 100%  Physical Exam  Nursing note and vitals reviewed. Constitutional: She is oriented to person, place, and time. She appears well-developed and well-nourished. No distress.  HENT:  Head: Normocephalic and atraumatic.  Right Ear: Tympanic membrane, external ear and ear canal normal.  Left Ear: Tympanic membrane, external ear and ear canal normal.  Nose: Nose normal. Right sinus exhibits no maxillary sinus tenderness and no frontal sinus tenderness. Left sinus exhibits no maxillary sinus tenderness and no frontal sinus tenderness.  Mouth/Throat: Uvula is midline, oropharynx is clear and moist and mucous membranes are normal. No oropharyngeal exudate.  Eyes: Conjunctivae normal and EOM are normal. Pupils are equal, round, and reactive to light. No scleral icterus.  Neck: Normal range of motion. Neck supple.  Cardiovascular: Regular rhythm, normal heart sounds and intact distal pulses.  Exam reveals no gallop and no friction rub.   No murmur heard. Pulmonary/Chest: Effort normal and breath sounds normal. No respiratory distress. She has no wheezes.  Abdominal: Soft. Bowel sounds are normal. She exhibits no mass. There is no tenderness. There is no rebound and no guarding.  Musculoskeletal: Normal range of motion. She exhibits no edema.       Mild tenderness to the paraspinal muscles of the L-spine NO tenderness to the spinous processes  Lymphadenopathy:    She has no cervical adenopathy.  Neurological: She is alert and oriented to person, place, and time. She exhibits normal muscle tone. Coordination normal.       Speech is clear and goal oriented, follows commands Normal strength in upper and lower extremities bilaterally including dorsiflexion and plantar flexion, strong and equal grip strength Sensation  normal to light and sharp touch Moves extremities without ataxia, coordination intact Normal gait and balance  Skin: Skin is warm and dry. No rash noted. She is not diaphoretic.  Psychiatric: Her mood appears anxious.       Pt sobbing uncontrollably throughout exam    ED Course  Procedures (including critical care time)  Labs Reviewed - No data to display No results found.   1. Anxiety       MDM  Danielle [name] presents with anxiety and back pain.  Patient given Ativan 0.5 mg by mouth here in the emergency department.  Anxiety is much improved. Patient has stopped crying.  I will give Naprosyn for her back pain along with a muscle relaxer and have her followup with orthopedics if pain persists.    1. Medications: Usual home medications 2. Treatment: Rest, stress reducing exercises as discussed 3. Follow Up: Use resource guide to find him a care physician to help manage her anxiety           Dierdre Forth, PA-C 08/18/12  2137 

## 2012-08-18 NOTE — ED Notes (Signed)
Pt states she just came from her brother's funeral and she is having an anxiety attack. Pt states she has pain to R side of ribs since her brother died 3 days ago. Pt tearful in triage.

## 2012-08-18 NOTE — ED Provider Notes (Signed)
Medical screening examination/treatment/procedure(s) were performed by non-physician practitioner and as supervising physician I was immediately available for consultation/collaboration.   Jaiona Simien L Ciin Brazzel, MD 08/18/12 2309 

## 2012-08-25 ENCOUNTER — Encounter (HOSPITAL_COMMUNITY): Payer: Self-pay

## 2012-08-25 ENCOUNTER — Encounter (HOSPITAL_COMMUNITY): Payer: Self-pay | Admitting: *Deleted

## 2012-08-25 ENCOUNTER — Emergency Department (HOSPITAL_COMMUNITY)
Admission: EM | Admit: 2012-08-25 | Discharge: 2012-08-26 | Disposition: A | Discharge status: Other Acute Inpatient Hospital | Payer: Medicare Other | Source: Home / Self Care | Attending: Emergency Medicine | Admitting: Emergency Medicine

## 2012-08-25 DIAGNOSIS — G2401 Drug induced subacute dyskinesia: Secondary | ICD-10-CM | POA: Diagnosis present

## 2012-08-25 DIAGNOSIS — W19XXXA Unspecified fall, initial encounter: Secondary | ICD-10-CM

## 2012-08-25 DIAGNOSIS — F1410 Cocaine abuse, uncomplicated: Secondary | ICD-10-CM

## 2012-08-25 DIAGNOSIS — F329 Major depressive disorder, single episode, unspecified: Secondary | ICD-10-CM | POA: Diagnosis present

## 2012-08-25 DIAGNOSIS — I2510 Atherosclerotic heart disease of native coronary artery without angina pectoris: Secondary | ICD-10-CM | POA: Diagnosis present

## 2012-08-25 DIAGNOSIS — F411 Generalized anxiety disorder: Secondary | ICD-10-CM | POA: Diagnosis present

## 2012-08-25 DIAGNOSIS — F1010 Alcohol abuse, uncomplicated: Secondary | ICD-10-CM | POA: Diagnosis present

## 2012-08-25 DIAGNOSIS — J45909 Unspecified asthma, uncomplicated: Secondary | ICD-10-CM | POA: Diagnosis present

## 2012-08-25 DIAGNOSIS — I10 Essential (primary) hypertension: Secondary | ICD-10-CM | POA: Diagnosis present

## 2012-08-25 DIAGNOSIS — F200 Paranoid schizophrenia: Secondary | ICD-10-CM

## 2012-08-25 DIAGNOSIS — F17200 Nicotine dependence, unspecified, uncomplicated: Secondary | ICD-10-CM | POA: Diagnosis present

## 2012-08-25 DIAGNOSIS — Z79899 Other long term (current) drug therapy: Secondary | ICD-10-CM

## 2012-08-25 DIAGNOSIS — F4310 Post-traumatic stress disorder, unspecified: Secondary | ICD-10-CM | POA: Diagnosis present

## 2012-08-25 HISTORY — DX: Unspecified fall, initial encounter: W19.XXXA

## 2012-08-25 LAB — CBC WITH DIFFERENTIAL/PLATELET
Basophils Absolute: 0 10*3/uL (ref 0.0–0.1)
Basophils Relative: 1 % (ref 0–1)
Eosinophils Absolute: 0.1 10*3/uL (ref 0.0–0.7)
Eosinophils Relative: 2 % (ref 0–5)
HCT: 39.7 % (ref 36.0–46.0)
Hemoglobin: 13.1 g/dL (ref 12.0–15.0)
Lymphocytes Relative: 68 % — ABNORMAL HIGH (ref 12–46)
Lymphs Abs: 4.8 10*3/uL — ABNORMAL HIGH (ref 0.7–4.0)
MCH: 29.8 pg (ref 26.0–34.0)
MCHC: 33 g/dL (ref 30.0–36.0)
MCV: 90.2 fL (ref 78.0–100.0)
Monocytes Absolute: 0.7 10*3/uL (ref 0.1–1.0)
Monocytes Relative: 10 % (ref 3–12)
Neutro Abs: 1.4 10*3/uL — ABNORMAL LOW (ref 1.7–7.7)
Neutrophils Relative %: 19 % — ABNORMAL LOW (ref 43–77)
Platelets: 252 10*3/uL (ref 150–400)
RBC: 4.4 MIL/uL (ref 3.87–5.11)
RDW: 14.3 % (ref 11.5–15.5)
WBC: 7.1 10*3/uL (ref 4.0–10.5)

## 2012-08-25 LAB — COMPREHENSIVE METABOLIC PANEL
ALT: 20 U/L (ref 0–35)
AST: 35 U/L (ref 0–37)
Albumin: 3.5 g/dL (ref 3.5–5.2)
Alkaline Phosphatase: 71 U/L (ref 39–117)
BUN: 19 mg/dL (ref 6–23)
CO2: 28 mEq/L (ref 19–32)
Calcium: 9.8 mg/dL (ref 8.4–10.5)
Chloride: 103 mEq/L (ref 96–112)
Creatinine, Ser: 0.86 mg/dL (ref 0.50–1.10)
GFR calc Af Amer: 84 mL/min — ABNORMAL LOW (ref >90)
GFR calc non Af Amer: 73 mL/min — ABNORMAL LOW (ref >90)
Glucose, Bld: 96 mg/dL (ref 70–99)
Potassium: 4.2 mEq/L (ref 3.5–5.1)
Sodium: 141 mEq/L (ref 135–145)
Total Bilirubin: 0.5 mg/dL (ref 0.3–1.2)
Total Protein: 7.6 g/dL (ref 6.0–8.3)

## 2012-08-25 LAB — URINALYSIS, ROUTINE W REFLEX MICROSCOPIC
Bilirubin Urine: NEGATIVE
Glucose, UA: NEGATIVE mg/dL
Hgb urine dipstick: NEGATIVE
Ketones, ur: NEGATIVE mg/dL
Nitrite: NEGATIVE
Protein, ur: NEGATIVE mg/dL
Specific Gravity, Urine: 1.033 — ABNORMAL HIGH (ref 1.005–1.030)
Urobilinogen, UA: 1 mg/dL (ref 0.0–1.0)
pH: 5.5 (ref 5.0–8.0)

## 2012-08-25 LAB — RAPID URINE DRUG SCREEN, HOSP PERFORMED
Amphetamines: NOT DETECTED
Barbiturates: NOT DETECTED
Benzodiazepines: POSITIVE — AB
Cocaine: POSITIVE — AB
Opiates: NOT DETECTED
Tetrahydrocannabinol: NOT DETECTED

## 2012-08-25 LAB — ETHANOL: Alcohol, Ethyl (B): <11 mg/dL (ref 0–11)

## 2012-08-25 LAB — URINE MICROSCOPIC-ADD ON

## 2012-08-25 LAB — AMMONIA: Ammonia: 21 umol/L (ref 11–60)

## 2012-08-25 MED ORDER — TRAZODONE HCL 50 MG PO TABS
100.0000 mg | ORAL_TABLET | Freq: Every day | ORAL | Status: DC
  Administered 2012-08-25: 100 mg via ORAL
  Filled 2012-08-25: qty 2

## 2012-08-25 MED ORDER — NICOTINE 21 MG/24HR TD PT24
21.0000 mg | MEDICATED_PATCH | Freq: Every day | TRANSDERMAL | Status: DC
  Administered 2012-08-25: 21 mg via TRANSDERMAL
  Filled 2012-08-25: qty 1

## 2012-08-25 MED ORDER — ONDANSETRON HCL 8 MG PO TABS: 4.0000 mg | ORAL_TABLET | Freq: Three times a day (TID) | ORAL | Status: DC | PRN

## 2012-08-25 MED ORDER — ZOLPIDEM TARTRATE 5 MG PO TABS: 5.0000 mg | ORAL_TABLET | Freq: Every evening | ORAL | Status: DC | PRN

## 2012-08-25 NOTE — ED Notes (Addendum)
Pt reports lower back pain radiating into (L) leg, pt also reports bilateral knee pain d/t falling last week, pt reports seen and treated at [facility] ED for fall. Pt went to [facility] today approx 1430 today for SI, pt reports increase depression d/t her brother passing away last week, pt reports her plan would be to "jump off a bridge" pt sober x1 month had a relapse 08/20/2012, pt also reports AH w/the voices telling her to kill herself and to hurt others. Pt here w./a sitter from [facility], charge RN and [facility] informed of sitter needed.  Pt also reports she has not taking any of her medications in 1 1/2 months. Pt reports hx of AH when not taking her medication
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
## 2012-08-25 NOTE — BH Assessment (Signed)
[facility] Assessment Progress Note      Pt has been medically cleared.  Confirmed with Rosey Bath that patient may be transferred back to [facility].  She will go to [facility].  Support paperwork complete.  ED staff notified.  Pt may be transferred approximately 2345

## 2012-08-25 NOTE — ED Provider Notes (Signed)
History   This chart was scribed for Danielle Quarry, MD, by Marcina Millard scribe. The patient was seen in room TR05C/TR05C and the patient's care was started at 1912.    CSN: [id number]  Arrival date & time 08/25/12  1705   First MD Initiated Contact with Patient 08/25/12 1912      Chief Complaint  Patient presents with  . V70.1    (Consider location/radiation/quality/duration/timing/severity/associated sxs/prior treatment) HPI Comments: Danielle [name] is a 61 y.o. female who presents to the Emergency Department needing medical clearance because of suicidal ideation. She has a h/o of schizophrenia, bipolar affective disorder and depression. She reports that she has not been taking her medications because she stays with her daughter, and she has not been taking her to her appointments. She reports hearing voices, which is gradually worse during highly stressful times. She reports that they are telling her to inflict hard to herself, but not in any specific manner. She states that she has not tried to harm herself in any way. She states that her last hospitalization for mental health was approximately one year ago.She also has a h/o of hypertension and Hepatitis C. She also reports that she fell down a flight of stairs 1 week ago and has been taking pain medication for lower back pain that radiates to the legs, but is out of the medication. When asked, she states that she feels that she needs to be hospitalized for her current mental health issues.         Patient is a 61 y.o. female presenting with mental health disorder.  Mental Health Problem The primary symptoms include hallucinations. The current episode started this week. This is a recurrent problem.  The hallucinations began this week. The hallucinations appear to have been unchanged since their onset. She has auditory hallucinations.  The onset of the illness is precipitated by drug abuse and a stressful event. The degree of  incapacity that she is experiencing as a consequence of her illness is severe. Additional symptoms of the illness include flight of ideas. She admits to suicidal ideas. She does not have a plan to commit suicide. She contemplates harming herself. She has not already injured self. She does not contemplate injuring another person. She has not already  injured another person. Risk factors that are present for mental illness include a history of mental illness and substance abuse.    Past Medical History  Diagnosis Date  . Hypertension   . Asthma   . Depression   . Bipolar affective disorder, currently depressed, mild   . Stroke   . Coronary artery disease   . Tardive dyskinesia   . Fall 08/25/2012    Recent fall with residual knee and back pain.    Past Surgical History  Procedure Date  . Abdominal hysterectomy   . Cardiac catheterization     Family History  Problem Relation Age of Onset  . Hypertension Mother     History  Substance Use Topics  . Smoking status: Current Every Day Smoker    Types: Cigarettes    Last Attempt to Quit: 07/13/2012  . Smokeless tobacco: Not on file  . Alcohol Use: Yes     Comment: occasionally    OB History    Grav Para Term Preterm Abortions TAB SAB Ect Mult Living                  Review of Systems  Psychiatric/Behavioral: Positive for suicidal ideas and hallucinations.  All  other systems reviewed and are negative.    Allergies  Clonazepam; Doxycycline; Fosinopril sodium; Hydrochlorothiazide w-triamterene; Penicillins; and Sulfonamide derivatives  Home Medications   Current Outpatient Rx  Name  Route  Sig  Dispense  Refill  . ALBUTEROL SULFATE HFA 108 (90 BASE) MCG/ACT IN AERS   Inhalation   Inhale 2 puffs into the lungs every 6 (six) hours as needed.         Marland Kitchen CITALOPRAM HYDROBROMIDE 40 MG PO TABS   Oral   Take 1 tablet (40 mg total) by mouth daily. For depression and anxiety.   30 tablet   0   . DIPHENHYDRAMINE HCL 25 MG  PO TABS   Oral   Take 25 mg by mouth every 6 (six) hours as needed. Allergic reaction         . HYDROCORTISONE 0.5 % EX CREA   Topical   Apply 1 application topically 3 (three) times daily.         Marland Kitchen NAPROXEN 500 MG PO TABS   Oral   Take 1 tablet (500 mg total) by mouth 2 (two) times daily with a meal.   30 tablet   0   . TRAZODONE HCL 100 MG PO TABS   Oral   Take 100 mg by mouth at bedtime.           BP 160/93  Pulse 95  Temp 98.3 F (36.8 C) (Oral)  Resp 18  SpO2 97%  Physical Exam  Nursing note and vitals reviewed. Constitutional: She is oriented to person, place, and time. She appears well-developed and well-nourished. No distress.  HENT:  Head: Normocephalic and atraumatic.  Eyes: EOM are normal. Pupils are equal, round, and reactive to light.  Neck: Normal range of motion. Neck supple. No tracheal deviation present.  Cardiovascular: Normal rate.   Pulmonary/Chest: Effort normal. No respiratory distress.  Abdominal: Soft. She exhibits no distension.  Musculoskeletal: Normal range of motion. She exhibits no edema.       She has tardive dyskinesia of the jaw.  Neurological: She is alert and oriented to person, place, and time.  Skin: Skin is warm and dry.  Psychiatric:       She voiced suicidal ideation. She is hearing auditory voices.     ED Course  Procedures (including critical care time)  DIAGNOSTIC STUDIES: Oxygen Saturation is 97% on room air, normal by my interpretation.    COORDINATION OF CARE:  19:18- Discussed planned course of treatment with the patient, including a telepsych consultation,  who is agreeable at this time.    Labs Reviewed  CBC WITH DIFFERENTIAL - Abnormal; Notable for the following:    Neutrophils Relative 19 (*)     Neutro Abs 1.4 (*)     Lymphocytes Relative 68 (*)     Lymphs Abs 4.8 (*)     All other components within normal limits  COMPREHENSIVE METABOLIC PANEL - Abnormal; Notable for the following:    GFR calc  non Af Amer 73 (*)     GFR calc Af Amer 84 (*)     All other components within normal limits  URINE RAPID DRUG SCREEN (HOSP PERFORMED) - Abnormal; Notable for the following:    Cocaine POSITIVE (*)     Benzodiazepines POSITIVE (*)     All other components within normal limits  URINALYSIS, ROUTINE W REFLEX MICROSCOPIC - Abnormal; Notable for the following:    APPearance CLOUDY (*)     Specific Gravity, Urine 1.033 (*)  Leukocytes, UA MODERATE (*)     All other components within normal limits  URINE MICROSCOPIC-ADD ON - Abnormal; Notable for the following:    Squamous Epithelial / LPF MANY (*)     Bacteria, UA FEW (*)     All other components within normal limits  ETHANOL   No results found.   No diagnosis found.    MDM  Patient with history of psychotic disorder who presents today with agitation.  Patient to be assessed by ACT and psych consult.  UDS positive for cocaine.      Danielle Quarry, MD 08/28/12 [phone]

## 2012-08-26 ENCOUNTER — Encounter (HOSPITAL_COMMUNITY): Payer: Self-pay

## 2012-08-26 ENCOUNTER — Inpatient Hospital Stay (HOSPITAL_COMMUNITY)
Admission: RE | Admit: 2012-08-26 | Discharge: 2012-08-28 | DRG: 885 | Disposition: A | Discharge status: Home / Self Care | Payer: Medicare Other | Attending: Psychiatry | Admitting: Psychiatry

## 2012-08-26 DIAGNOSIS — J069 Acute upper respiratory infection, unspecified: Secondary | ICD-10-CM

## 2012-08-26 DIAGNOSIS — E785 Hyperlipidemia, unspecified: Secondary | ICD-10-CM

## 2012-08-26 DIAGNOSIS — I10 Essential (primary) hypertension: Secondary | ICD-10-CM

## 2012-08-26 DIAGNOSIS — F1994 Other psychoactive substance use, unspecified with psychoactive substance-induced mood disorder: Secondary | ICD-10-CM

## 2012-08-26 DIAGNOSIS — F1410 Cocaine abuse, uncomplicated: Secondary | ICD-10-CM

## 2012-08-26 DIAGNOSIS — Z9119 Patient's noncompliance with other medical treatment and regimen: Secondary | ICD-10-CM

## 2012-08-26 DIAGNOSIS — F1910 Other psychoactive substance abuse, uncomplicated: Secondary | ICD-10-CM

## 2012-08-26 DIAGNOSIS — F3289 Other specified depressive episodes: Secondary | ICD-10-CM

## 2012-08-26 DIAGNOSIS — N760 Acute vaginitis: Secondary | ICD-10-CM

## 2012-08-26 DIAGNOSIS — F419 Anxiety disorder, unspecified: Secondary | ICD-10-CM

## 2012-08-26 DIAGNOSIS — F411 Generalized anxiety disorder: Secondary | ICD-10-CM

## 2012-08-26 DIAGNOSIS — G43909 Migraine, unspecified, not intractable, without status migrainosus: Secondary | ICD-10-CM

## 2012-08-26 DIAGNOSIS — J309 Allergic rhinitis, unspecified: Secondary | ICD-10-CM

## 2012-08-26 DIAGNOSIS — Z91199 Patient's noncompliance with other medical treatment and regimen due to unspecified reason: Secondary | ICD-10-CM

## 2012-08-26 DIAGNOSIS — F329 Major depressive disorder, single episode, unspecified: Secondary | ICD-10-CM

## 2012-08-26 DIAGNOSIS — F1210 Cannabis abuse, uncomplicated: Secondary | ICD-10-CM

## 2012-08-26 DIAGNOSIS — J45909 Unspecified asthma, uncomplicated: Secondary | ICD-10-CM

## 2012-08-26 DIAGNOSIS — F200 Paranoid schizophrenia: Secondary | ICD-10-CM

## 2012-08-26 DIAGNOSIS — K219 Gastro-esophageal reflux disease without esophagitis: Secondary | ICD-10-CM

## 2012-08-26 DIAGNOSIS — Z8672 Personal history of thrombophlebitis: Secondary | ICD-10-CM

## 2012-08-26 HISTORY — DX: Unspecified fall, initial encounter: W19.XXXA

## 2012-08-26 MED ORDER — HYDROCORTISONE 0.5 % EX CREA
TOPICAL_CREAM | Freq: Three times a day (TID) | CUTANEOUS | Status: DC
  Administered 2012-08-26: 09:00:00 via TOPICAL
  Filled 2012-08-26: qty 28.35

## 2012-08-26 MED ORDER — CLONIDINE HCL 0.1 MG PO TABS
0.1000 mg | ORAL_TABLET | Freq: Every day | ORAL | Status: DC
  Administered 2012-08-30: 0.1 mg via ORAL
  Filled 2012-08-26 (×2): qty 1

## 2012-08-26 MED ORDER — ALBUTEROL SULFATE HFA 108 (90 BASE) MCG/ACT IN AERS: 2.0000 | INHALATION_SPRAY | Freq: Four times a day (QID) | RESPIRATORY_TRACT | Status: DC | PRN

## 2012-08-26 MED ORDER — NAPROXEN 500 MG PO TABS
500.0000 mg | ORAL_TABLET | Freq: Two times a day (BID) | ORAL | Status: DC
Start: 2012-08-26 — End: 2012-08-26
  Filled 2012-08-26 (×2): qty 1

## 2012-08-26 MED ORDER — CLONIDINE HCL 0.1 MG PO TABS: 0.1000 mg | ORAL_TABLET | ORAL | Status: DC

## 2012-08-26 MED ORDER — CITALOPRAM HYDROBROMIDE 40 MG PO TABS
40.0000 mg | ORAL_TABLET | Freq: Every day | ORAL | Status: DC
  Administered 2012-08-26: 40 mg via ORAL
  Filled 2012-08-26 (×2): qty 1

## 2012-08-26 MED ORDER — MAGNESIUM HYDROXIDE 400 MG/5ML PO SUSP
30.0000 mL | Freq: Every day | ORAL | Status: DC | PRN
  Administered 2012-08-29: 30 mL via ORAL

## 2012-08-26 MED ORDER — TRAZODONE HCL 100 MG PO TABS
100.0000 mg | ORAL_TABLET | Freq: Every day | ORAL | Status: DC
  Administered 2012-08-26 – 2012-08-27 (×2): 100 mg via ORAL
  Filled 2012-08-26 (×3): qty 1

## 2012-08-26 MED ORDER — CLONIDINE HCL 0.1 MG PO TABS
0.1000 mg | ORAL_TABLET | Freq: Four times a day (QID) | ORAL | Status: DC
  Filled 2012-08-26 (×4): qty 1

## 2012-08-26 MED ORDER — LOPERAMIDE HCL 2 MG PO CAPS: 2.0000 mg | ORAL_CAPSULE | ORAL | Status: DC | PRN

## 2012-08-26 MED ORDER — CLONIDINE HCL 0.1 MG PO TABS
0.1000 mg | ORAL_TABLET | Freq: Four times a day (QID) | ORAL | Status: AC
  Administered 2012-08-26 – 2012-08-27 (×7): 0.1 mg via ORAL
  Filled 2012-08-26 (×7): qty 1

## 2012-08-26 MED ORDER — CLONIDINE HCL 0.1 MG PO TABS
0.1000 mg | ORAL_TABLET | ORAL | Status: AC
  Administered 2012-08-28 – 2012-08-29 (×4): 0.1 mg via ORAL
  Filled 2012-08-26 (×4): qty 1

## 2012-08-26 MED ORDER — TRAZODONE HCL 100 MG PO TABS
100.0000 mg | ORAL_TABLET | Freq: Every day | ORAL | Status: DC
  Filled 2012-08-26: qty 1

## 2012-08-26 MED ORDER — HYDROCORTISONE 0.5 % EX CREA
1.0000 "application " | TOPICAL_CREAM | Freq: Three times a day (TID) | CUTANEOUS | Status: DC
  Administered 2012-08-26 – 2012-08-30 (×4): 1 via TOPICAL
  Filled 2012-08-26: qty 28.35

## 2012-08-26 MED ORDER — BENZTROPINE MESYLATE 0.5 MG PO TABS
0.5000 mg | ORAL_TABLET | Freq: Two times a day (BID) | ORAL | Status: DC
  Administered 2012-08-26 – 2012-08-27 (×3): 0.5 mg via ORAL
  Filled 2012-08-26 (×7): qty 1

## 2012-08-26 MED ORDER — DIPHENHYDRAMINE HCL 25 MG PO CAPS
25.0000 mg | ORAL_CAPSULE | Freq: Four times a day (QID) | ORAL | Status: DC | PRN
  Administered 2012-08-26: 25 mg via ORAL
  Filled 2012-08-26: qty 1

## 2012-08-26 MED ORDER — DIPHENHYDRAMINE HCL 25 MG PO TABS: 25.0000 mg | ORAL_TABLET | Freq: Four times a day (QID) | ORAL | Status: DC | PRN

## 2012-08-26 MED ORDER — RISPERIDONE 0.25 MG PO TABS
0.2500 mg | ORAL_TABLET | Freq: Two times a day (BID) | ORAL | Status: DC
  Administered 2012-08-26 – 2012-08-27 (×3): 0.25 mg via ORAL
  Filled 2012-08-26 (×7): qty 1

## 2012-08-26 MED ORDER — NAPROXEN 500 MG PO TABS
500.0000 mg | ORAL_TABLET | Freq: Two times a day (BID) | ORAL | Status: DC
  Administered 2012-08-26 – 2012-08-30 (×8): 500 mg via ORAL
  Filled 2012-08-26 (×12): qty 1

## 2012-08-26 MED ORDER — ACETAMINOPHEN 325 MG PO TABS
650.0000 mg | ORAL_TABLET | Freq: Four times a day (QID) | ORAL | Status: DC | PRN
  Administered 2012-08-26: 650 mg via ORAL
  Filled 2012-08-26: qty 2

## 2012-08-26 MED ORDER — NICOTINE 21 MG/24HR TD PT24
21.0000 mg | MEDICATED_PATCH | Freq: Every day | TRANSDERMAL | Status: DC
  Administered 2012-08-26 – 2012-08-30 (×5): 21 mg via TRANSDERMAL
  Filled 2012-08-26 (×6): qty 1

## 2012-08-26 MED ORDER — DICYCLOMINE HCL 20 MG PO TABS: 20.0000 mg | ORAL_TABLET | Freq: Four times a day (QID) | ORAL | Status: DC | PRN

## 2012-08-26 MED ORDER — ONDANSETRON 4 MG PO TBDP
4.0000 mg | ORAL_TABLET | Freq: Four times a day (QID) | ORAL | Status: DC | PRN
  Administered 2012-08-28: 4 mg via ORAL
  Filled 2012-08-26: qty 1

## 2012-08-26 MED ORDER — CLONIDINE HCL 0.1 MG PO TABS: 0.1000 mg | ORAL_TABLET | Freq: Every day | ORAL | Status: DC

## 2012-08-26 MED ORDER — ALUM & MAG HYDROXIDE-SIMETH 200-200-20 MG/5ML PO SUSP
30.0000 mL | ORAL | Status: DC | PRN
  Administered 2012-08-27 – 2012-08-29 (×2): 30 mL via ORAL

## 2012-08-26 MED ORDER — NAPROXEN 500 MG PO TABS
500.0000 mg | ORAL_TABLET | Freq: Two times a day (BID) | ORAL | Status: DC
  Administered 2012-08-26: 500 mg via ORAL
  Filled 2012-08-26 (×3): qty 1

## 2012-08-26 MED ORDER — METHOCARBAMOL 500 MG PO TABS: 500.0000 mg | ORAL_TABLET | Freq: Three times a day (TID) | ORAL | Status: DC | PRN

## 2012-08-26 MED ORDER — HYDROXYZINE HCL 25 MG PO TABS
25.0000 mg | ORAL_TABLET | Freq: Four times a day (QID) | ORAL | Status: DC | PRN
  Administered 2012-08-29 – 2012-08-30 (×2): 25 mg via ORAL

## 2012-08-26 MED ORDER — GABAPENTIN 100 MG PO CAPS
100.0000 mg | ORAL_CAPSULE | Freq: Three times a day (TID) | ORAL | Status: DC
  Administered 2012-08-26 – 2012-08-27 (×4): 100 mg via ORAL
  Filled 2012-08-26 (×9): qty 1

## 2012-08-26 MED ORDER — NAPROXEN 500 MG PO TABS: 500.0000 mg | ORAL_TABLET | Freq: Two times a day (BID) | ORAL | Status: DC | PRN

## 2012-08-26 MED ORDER — CITALOPRAM HYDROBROMIDE 40 MG PO TABS
40.0000 mg | ORAL_TABLET | Freq: Every day | ORAL | Status: DC
  Administered 2012-08-27 – 2012-08-30 (×4): 40 mg via ORAL
  Filled 2012-08-26 (×6): qty 1
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
## 2012-08-26 NOTE — Progress Notes (Signed)
[facility] Group Notes:  (Counselor/Nursing/MHT/Case Management/Adjunct)  08/26/2012 12:43 PM  Type of Therapy:  Group Therapy  Participation Level:  Did Not Attend    Danielle [name] 08/26/2012, 12:43 PM

## 2012-08-26 NOTE — Progress Notes (Signed)
Psychoeducational Group Note  Date:  08/26/2012 Time: 0900  Group Topic/Focus:  Self Inventory  Participation Level:  Did Not Attend   Danielle [name] 08/26/2012, 9:44 AM

## 2012-08-26 NOTE — Tx Team (Signed)
Initial Interdisciplinary Treatment Plan  PATIENT STRENGTHS: (choose at least two) Ability for insight Motivation for treatment/growth  PATIENT STRESSORS: Financial difficulties Health problems Loss of borther last Saturday* Medication change or noncompliance   PROBLEM LIST: Problem List/Patient Goals Date to be addressed Date deferred Reason deferred Estimated date of resolution  SI 08/26/2012     Non compliance with medications      Depression      Anxiety attacks                                     DISCHARGE CRITERIA:  Adequate post-discharge living arrangements Improved stabilization in mood, thinking, and/or behavior Need for constant or close observation no longer present Verbal commitment to aftercare and medication compliance  PRELIMINARY DISCHARGE PLAN: Placement in alternative living arrangements  PATIENT/FAMIILY INVOLVEMENT: This treatment plan has been presented to and reviewed with the patient, Danielle [name], and/or family member.  The patient and family have been given the opportunity to ask questions and make suggestions.  Danielle [name] 08/26/2012, 3:46 AM

## 2012-08-26 NOTE — Progress Notes (Signed)
Pt has been isolative to room this evening. She did come out for snack but refused evening group. She is blunted in affect with depressed mood. Endorses AH at this time but denies VH. She also denies any withdrawal symptoms. Pt mentioned fall prior to admit. Fall precautions reviewed and strongly encouraged. Emotional support given. Medicated without difficulty. She denies SI/HI and verbally contracts for safety should that change. Lawrence Marseilles

## 2012-08-26 NOTE — Progress Notes (Signed)
Psychoeducational Group Note  Date:  08/26/2012 Time:  0945 am  Group Topic/Focus:  Identifying Needs:   The focus of this group is to help patients identify their personal needs that have been historically problematic and identify healthy behaviors to address their needs.  Participation Level:  Did Not Attend   Andrena Mews 08/26/2012,11:54 AM

## 2012-08-26 NOTE — Progress Notes (Signed)
Psychoeducational Group Note  Date:  08/26/2012 Time:  1515  Group Topic/Focus:  Self Care:   The focus of this group is to help patients understand the importance of self-care in order to improve or restore emotional, physical, spiritual, interpersonal, and financial health.  Participation Level:  Did Not Attend  Participation Quality:    Affect:    Cognitive:    Insight:    Engagement in Group:    Additional Comments:  none  Marquis Lunch, Jaslynn Thome 08/26/2012, 7:03 PM
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
## 2012-08-26 NOTE — Progress Notes (Signed)
Patient voluntarily admitted reporting SI to jump off a bridge. Patient reports her stressors are her brother died last 03-14-23 and during that same week her neighbor committed suicide. Patient reports she had been sober for 1 month and relapsed on 11/3, she reports drinking 2 beers, whiskey and crack. Patient reports auditory hallucinations with voices telling her to kill herself and hurt others and occasional visual hallucinations. Patient reports that she fell on last week and has been having lower back pain and both legs ache. Medical hx include htn, asthma, stroke, CAD, and cardiac cath. Patient reports having tarditive dyskinesia and anxiety attacks. Patient recently lived with one of her daughters who is patients payee and the household became stressful with the daughter her 3 kids and her boyfriend living in the same house. Her daughter took her live with another daughter this daughters husband did not want her there. Patient is not certain that she wants to return to live with either daughter, reports that she may need to look into assistant live facility. Patient denies hi

## 2012-08-26 NOTE — Progress Notes (Signed)
D   Pt is depressed and sad   She isolates to her room and has very limited interaction with others   She did not attend group today   She denies signs and symptoms of withdrawal   She endorses voices and said they are very bothersome  A   Verbal support given   Medications administered and effectiveness monitored   Socialization encouraged  Group attendance encouraged   Q 15 min checks R   Pt safe at present and verbalizes knowledge of medications

## 2012-08-27 DIAGNOSIS — F200 Paranoid schizophrenia: Secondary | ICD-10-CM

## 2012-08-27 LAB — URINE CULTURE: Colony Count: >=100000

## 2012-08-27 MED ORDER — RISPERIDONE 1 MG PO TABS
1.0000 mg | ORAL_TABLET | Freq: Two times a day (BID) | ORAL | Status: DC
  Administered 2012-08-27 – 2012-08-30 (×5): 1 mg via ORAL
  Filled 2012-08-27 (×8): qty 1

## 2012-08-27 MED ORDER — GABAPENTIN 100 MG PO CAPS
200.0000 mg | ORAL_CAPSULE | Freq: Three times a day (TID) | ORAL | Status: DC
  Administered 2012-08-27 – 2012-08-30 (×9): 200 mg via ORAL
  Filled 2012-08-27 (×12): qty 2

## 2012-08-27 MED ORDER — BENZTROPINE MESYLATE 1 MG PO TABS
1.0000 mg | ORAL_TABLET | Freq: Two times a day (BID) | ORAL | Status: DC
  Administered 2012-08-27 – 2012-08-30 (×6): 1 mg via ORAL
  Filled 2012-08-27 (×8): qty 1

## 2012-08-27 NOTE — Progress Notes (Signed)
Psychoeducational Group Note  Date:  08/27/2012 Time:  0900  Group Topic/Focus:  Self Inventory  Participation Level:  Active  Participation Quality:  Appropriate, Attentive and Sharing  Affect:  Appropriate and Depressed  Cognitive:  Alert, Appropriate and Oriented  Insight:  Good  Engagement in Group:  Good  Additional Comments:    Madolyn Ackroyd Shari Prows 08/27/2012, 9:49 AM

## 2012-08-27 NOTE — H&P (Signed)
Medical/psychiatric screening examination/treatment/procedure(s) were performed by non-physician practitioner and as supervising physician I was immediately available for consultation/collaboration.  I have seen and examined this patient and agree with the major elements of this evaluation.  

## 2012-08-27 NOTE — Progress Notes (Signed)
Pt has been in bed all evening sleeping. She reports however that she was up and out of her room today though chart indicates otherwise. Refused evening group. She endorses command hallucinations to harm self but is able to verbally contract for safety. Denies any VH/HI. Medicated per orders. Encouraged to be more visible on the unit. Emotional support given. She remains safe. Lawrence Marseilles

## 2012-08-27 NOTE — Clinical Social Work Note (Signed)
[facility] Group Notes:  (Clinical Social Work)  08/27/2012   11:15-11:45AM  Summary of Progress/Problems:   The main focus of today's process group was for the patient to identify their current support system and decide on other supports that can be put in place to prevent future hospitalizations.  An emphasis was placed on using therapist, doctor and problem-specific support groups to expand supports. The patient expressed that she had been living with her younger daughter, and thought this daughter did not want her to return there to live with her and her children (patient's grandchildren).  However, they spoke on the phone this morning and she is welcome back there.  So she feels good about her supports.  She also remembers talking with her case manager during last admission about an ACT Team, and was not at that time eligible.  She wonders if she would possibly be eligible now.  Type of Therapy:  Group Therapy  Participation Level:  Active  Participation Quality:  Appropriate, Attentive and Sharing  Affect:  Appropriate and Flat  Cognitive:  Alert and Appropriate  Insight:  Good  Engagement in Group:  Good  Engagement in Therapy:  Good  Modes of Intervention:  Clarification, Education, Limit-setting, Problem-solving, Socialization, Support and Processing   Ambrose Mantle, LCSW 08/27/2012, 12:16 PM

## 2012-08-27 NOTE — Progress Notes (Signed)
Psychoeducational Group Note  Date:  08/27/2012 Time:  0945 am  Group Topic/Focus:  Making Healthy Choices:   The focus of this group is to help patients identify negative/unhealthy choices they were using prior to admission and identify positive/healthier coping strategies to replace them upon discharge.  Participation Level:  Active  Participation Quality:  Appropriate and Attentive  Affect:  Appropriate  Cognitive:  Appropriate  Insight:  Good  Engagement in Group:  Good  Additional Comments:    Andrena Mews 08/27/2012, 10:29 AM

## 2012-08-27 NOTE — Progress Notes (Signed)
D   Pt is depressed and sad but much improved from yesterday  She attended spirituality group and participated appropriately    She was smiling and pleasant on approach  She denies any withdrawal symptoms but does report anxiety which she said was unrelated to withdrawal   She reports improved appetite and sleep A   Verbal support given  Medications administered and effectiveness monitored   Q 15 min checks R   Pt safe at present

## 2012-08-27 NOTE — Progress Notes (Signed)
Patient ID: Danielle [name], female   DOB: 1951/01/03, 61 y.o.   MRN: [id number] [facility] MD Progress Note  08/27/2012 4:02 PM Danielle [name]  MRN:  [id number]  Diagnosis:  Schizophrenia, paranoid type  ADL's:  Intact  Sleep: Fair  Appetite:  Fair  Suicidal Ideation:  denies Homicidal Ideation:  denies  AEB (as evidenced by): Subjective: Met with the patient 1:1 to discuss her treatment and response to care. She reports no new symptoms and states she is feeling fine today. Mental Status Examination/Evaluation: Objective:  Appearance: Disheveled  Eye Contact::  Good  Speech:  goal directed  Volume:  Decreased  Mood:  Anxious and Depressed  Affect:  Non-Congruent  Thought Process:  Coherent  Orientation:  Full  Thought Content:  WDL  Suicidal Thoughts:  No  Homicidal Thoughts:  No  Memory:  Immediate;   Fair Recent;   Fair Remote;   Fair  Judgement:  Impaired  Insight:  Lacking  Psychomotor Activity:  Normal  Concentration:  Fair  Recall:  Fair  Akathisia:  No  Handed:    AIMS (if indicated):     Assets:  Social Support  Sleep:  Number of Hours: 6.75    Vital Signs:Blood pressure 134/81, pulse 87, temperature 97.3 F (36.3 C), resp. rate 20, height 5' (1.524 m), weight 63.05 kg (139 lb). Current Medications: Current Facility-Administered Medications  Medication Dose Route Frequency Provider Last Rate Last Dose  . acetaminophen (TYLENOL) tablet 650 mg  650 mg Oral Q6H PRN Nelly Rout, MD   650 mg at 08/26/12 0259  . alum & mag hydroxide-simeth (MAALOX/MYLANTA) 200-200-20 MG/5ML suspension 30 mL  30 mL Oral Q4H PRN Nelly Rout, MD   30 mL at 08/27/12 1542  . benztropine (COGENTIN) tablet 1 mg  1 mg Oral BID Verne Spurr, PA-C      . citalopram (CELEXA) tablet 40 mg  40 mg Oral Daily Nanine Means, NP   40 mg at 08/27/12 0804  . cloNIDine (CATAPRES) tablet 0.1 mg  0.1 mg Oral QID Nanine Means, NP   0.1 mg at 08/27/12 1207   Followed by  . cloNIDine (CATAPRES)  tablet 0.1 mg  0.1 mg Oral BH-qamhs Nanine Means, NP       Followed by  . cloNIDine (CATAPRES) tablet 0.1 mg  0.1 mg Oral QAC breakfast Nanine Means, NP      . dicyclomine (BENTYL) tablet 20 mg  20 mg Oral Q6H PRN Nanine Means, NP      . gabapentin (NEURONTIN) capsule 200 mg  200 mg Oral TID Verne Spurr, PA-C      . hydrocortisone cream 0.5 % 1 application  1 application Topical TID Mike Craze, MD   1 application at 08/27/12 0803  . hydrOXYzine (ATARAX/VISTARIL) tablet 25 mg  25 mg Oral Q6H PRN Nanine Means, NP      . loperamide (IMODIUM) capsule 2-4 mg  2-4 mg Oral PRN Nanine Means, NP      . magnesium hydroxide (MILK OF MAGNESIA) suspension 30 mL  30 mL Oral Daily PRN Nelly Rout, MD      . methocarbamol (ROBAXIN) tablet 500 mg  500 mg Oral Q8H PRN Nanine Means, NP      . naproxen (NAPROSYN) tablet 500 mg  500 mg Oral BID WC Nanine Means, NP   500 mg at 08/27/12 0804  . nicotine (NICODERM CQ - dosed in mg/24 hours) patch 21 mg  21 mg Transdermal Q0600 Nanine Means, NP   21 mg  at 08/27/12 1610  . ondansetron (ZOFRAN-ODT) disintegrating tablet 4 mg  4 mg Oral Q6H PRN Nanine Means, NP      . risperiDONE (RISPERDAL) tablet 1 mg  1 mg Oral BID Verne Spurr, PA-C      . traZODone (DESYREL) tablet 100 mg  100 mg Oral QHS Nanine Means, NP   100 mg at 08/26/12 2132  . [DISCONTINUED] benztropine (COGENTIN) tablet 0.5 mg  0.5 mg Oral BID Nanine Means, NP   0.5 mg at 08/27/12 0804  . [DISCONTINUED] gabapentin (NEURONTIN) capsule 100 mg  100 mg Oral TID Nanine Means, NP   100 mg at 08/27/12 1207  . [DISCONTINUED] risperiDONE (RISPERDAL) tablet 0.25 mg  0.25 mg Oral BID Mike Craze, MD   0.25 mg at 08/27/12 0804    Lab Results:  Results for orders placed during the hospital encounter of 08/25/12 (from the past 48 hour(s))  CBC WITH DIFFERENTIAL     Status: Abnormal   Collection Time   08/25/12  6:00 PM      Component Value Range Comment   WBC 7.1  4.0 - 10.5 K/uL    RBC 4.40  3.87 - 5.11  MIL/uL    Hemoglobin 13.1  12.0 - 15.0 g/dL    HCT 96.0  45.4 - 09.8 %    MCV 90.2  78.0 - 100.0 fL    MCH 29.8  26.0 - 34.0 pg    MCHC 33.0  30.0 - 36.0 g/dL    RDW 11.9  14.7 - 82.9 %    Platelets 252  150 - 400 K/uL    Neutrophils Relative 19 (*) 43 - 77 %    Neutro Abs 1.4 (*) 1.7 - 7.7 K/uL    Lymphocytes Relative 68 (*) 12 - 46 %    Lymphs Abs 4.8 (*) 0.7 - 4.0 K/uL    Monocytes Relative 10  3 - 12 %    Monocytes Absolute 0.7  0.1 - 1.0 K/uL    Eosinophils Relative 2  0 - 5 %    Eosinophils Absolute 0.1  0.0 - 0.7 K/uL    Basophils Relative 1  0 - 1 %    Basophils Absolute 0.0  0.0 - 0.1 K/uL   COMPREHENSIVE METABOLIC PANEL     Status: Abnormal   Collection Time   08/25/12  6:00 PM      Component Value Range Comment   Sodium 141  135 - 145 mEq/L    Potassium 4.2  3.5 - 5.1 mEq/L    Chloride 103  96 - 112 mEq/L    CO2 28  19 - 32 mEq/L    Glucose, Bld 96  70 - 99 mg/dL    BUN 19  6 - 23 mg/dL    Creatinine, Ser 5.62  0.50 - 1.10 mg/dL    Calcium 9.8  8.4 - 13.0 mg/dL    Total Protein 7.6  6.0 - 8.3 g/dL    Albumin 3.5  3.5 - 5.2 g/dL    AST 35  0 - 37 U/L    ALT 20  0 - 35 U/L    Alkaline Phosphatase 71  39 - 117 U/L    Total Bilirubin 0.5  0.3 - 1.2 mg/dL    GFR calc non Af Amer 73 (*) >90 mL/min    GFR calc Af Amer 84 (*) >90 mL/min   ETHANOL     Status: Normal   Collection Time   08/25/12  6:00  PM      Component Value Range Comment   Alcohol, Ethyl (B) <11  0 - 11 mg/dL   URINE RAPID DRUG SCREEN (HOSP PERFORMED)     Status: Abnormal   Collection Time   08/25/12  6:05 PM      Component Value Range Comment   Opiates NONE DETECTED  NONE DETECTED    Cocaine POSITIVE (*) NONE DETECTED    Benzodiazepines POSITIVE (*) NONE DETECTED    Amphetamines NONE DETECTED  NONE DETECTED    Tetrahydrocannabinol NONE DETECTED  NONE DETECTED    Barbiturates NONE DETECTED  NONE DETECTED   URINALYSIS, ROUTINE W REFLEX MICROSCOPIC     Status: Abnormal   Collection Time   08/25/12   6:05 PM      Component Value Range Comment   Color, Urine YELLOW  YELLOW    APPearance CLOUDY (*) CLEAR    Specific Gravity, Urine 1.033 (*) 1.005 - 1.030    pH 5.5  5.0 - 8.0    Glucose, UA NEGATIVE  NEGATIVE mg/dL    Hgb urine dipstick NEGATIVE  NEGATIVE    Bilirubin Urine NEGATIVE  NEGATIVE    Ketones, ur NEGATIVE  NEGATIVE mg/dL    Protein, ur NEGATIVE  NEGATIVE mg/dL    Urobilinogen, UA 1.0  0.0 - 1.0 mg/dL    Nitrite NEGATIVE  NEGATIVE    Leukocytes, UA MODERATE (*) NEGATIVE   URINE MICROSCOPIC-ADD ON     Status: Abnormal   Collection Time   08/25/12  6:05 PM      Component Value Range Comment   Squamous Epithelial / LPF MANY (*) RARE    WBC, UA 3-6  <3 WBC/hpf    RBC / HPF 0-2  <3 RBC/hpf    Bacteria, UA FEW (*) RARE   URINE CULTURE     Status: Normal   Collection Time   08/25/12  6:05 PM      Component Value Range Comment   Specimen Description URINE, CLEAN CATCH      Special Requests NONE      Culture  Setup Time 08/26/2012 02:34      Colony Count >=100,000 COLONIES/ML      Culture        Value: Multiple bacterial morphotypes present, none predominant. Suggest appropriate recollection if clinically indicated.   Report Status 08/27/2012 FINAL     AMMONIA     Status: Normal   Collection Time   08/25/12  8:34 PM      Component Value Range Comment   Ammonia 21  11 - 60 umol/L     Physical Findings: AIMS: Facial and Oral Movements Muscles of Facial Expression: Mild Lips and Perioral Area: Mild Jaw: Minimal Tongue: Minimal,Extremity Movements Upper (arms, wrists, hands, fingers): None, normal Lower (legs, knees, ankles, toes): None, normal, Trunk Movements Neck, shoulders, hips: Minimal, Overall Severity Severity of abnormal movements (highest score from questions above): Mild Incapacitation due to abnormal movements: None, normal Patient's awareness of abnormal movements (rate only patient's report): Aware, no distress, Dental Status Current problems with teeth  and/or dentures?: Yes (pt reports top dentures don't stay in) Does patient usually wear dentures?: Yes (wears only bottom plate to help chew her food)  CIWA:  CIWA-Ar Total: 1  COWS:  COWS Total Score: 2   Treatment Plan Summary: 1. Admit for crisis management and stabilization. 2. Medication management to reduce current symptoms to base line and improve the patient's overall level of functioning 3. Treat health problems as  indicated. 4. Develop treatment plan to decrease risk of relapse upon discharge and the need for readmission. 5. Psycho-social education regarding relapse prevention and self care. 6. Health care follow up as needed for medical problems. 7. Restart home medications where appropriate.  Plan: 1. Will continue the current plan of care as written. 2. Would consider an injectable medication if non-compliance is a problem. 3. Increased cogentin to 1mg  BID due to the history of TD. 4. Increased the Respiradol to 1mg  BID. 5. Also increased neurontin to 200mg  TID for anxiety.  Rona Ravens. Maudine Kluesner PAC 08/27/2012, 4:02 PM

## 2012-08-28 MED ORDER — DIPHENHYDRAMINE HCL 50 MG/ML IJ SOLN
50.0000 mg | Freq: Once | INTRAMUSCULAR | Status: AC
  Administered 2012-08-28: 50 mg via INTRAMUSCULAR
  Filled 2012-08-28 (×2): qty 1

## 2012-08-28 MED ORDER — TRAZODONE HCL 150 MG PO TABS
150.0000 mg | ORAL_TABLET | Freq: Every day | ORAL | Status: DC
  Administered 2012-08-28 – 2012-08-29 (×2): 150 mg via ORAL
  Filled 2012-08-28 (×3): qty 1

## 2012-08-28 MED ORDER — RISPERIDONE MICROSPHERES 25 MG IM SUSR
25.0000 mg | INTRAMUSCULAR | Status: DC
  Administered 2012-08-28: 25 mg via INTRAMUSCULAR
  Filled 2012-08-28: qty 2

## 2012-08-28 NOTE — Treatment Plan (Signed)
Interdisciplinary Treatment Plan Update (Adult)  Date: 08/28/2012  Time Reviewed: 9:57 AM   Progress in Treatment: Attending groups: Yes Participating in groups: Yes Taking medication as prescribed: Yes Tolerating medication: Yes   Family/Significant other contact made:  Will call today Patient understands diagnosis:  Yes  As evidenced by asking for help with psychosis and substance abuse Discussing patient identified problems/goals with staff:  Yes  See below Medical problems stabilized or resolved:  Yes Denies suicidal/homicidal ideation: Yes  In tx team Issues/concerns per patient self-inventory:  Yes  Rates depression and hopelessness at 7.  Positive for SI,  C/O dizziness, headaches, pain Other:  New problem(s) identified: N/A  Reason for Continuation of Hospitalization: Depression Hallucinations Medication stabilization  Interventions implemented related to continuation of hospitalization:  Open to Consta, will administer today  Additional comments: CSW to contact step daughter for collaterol, SPE  Estimated length of stay: 1-2 days  Discharge Plan: Return home, follow up outpt  New goal(s): N/A  Review of initial/current patient goals per problem list:   1.  Goal(s): Eliminate SI  Met:  Yes  Target date:11/11  As evidenced AV:[id number] report in tx team  2.  Goal (s): Stabilize mood  Met:  No  Target date:11/13  As evidenced by: [name] will rate her depression and anxiety at a 4 or less  3.  Goal(s): Eliminate psychosis  Met:  No  Target date:11/13  As evidenced JX:BJYN report and observation  4.  Goal(s): Identify comprehensive sobriety and mental wellness plan  Met:  No  Target date: 11/13  As evidenced WG:NFAO report  Attendees: Patient:  Danielle [name] 08/28/2012 9:57 AM  Family:     Physician:  Thedore Mins 08/28/2012 9:57 AM   Nursing:  Joslyn Devon  08/28/2012 9:57 AM   Clinical Social Worker:  Richelle Ito 08/28/2012 9:57 AM    Extender:  Verne Spurr PA 08/28/2012 9:57 AM   Other:     Other:     Other:     Other:      Scribe for Treatment Team:   [name], 08/28/2012 9:57 AM

## 2012-08-28 NOTE — Progress Notes (Signed)
Psychoeducational Group Note  Date:  08/28/2012 Time: 1100  Group Topic/Focus:  Wellness Toolbox:   The focus of this group is to discuss various aspects of wellness, balancing those aspects and exploring ways to increase the ability to experience wellness.  Patients will create a wellness toolbox for use upon discharge.  Participation Level:  Did Not Attend  Participation Quality:    Affect:    Cognitive:    Insight:    Engagement in Group:    Additional Comments:  Pt was sleeping, refused to attend.  Isla Pence M 08/28/2012, 12:12 PM

## 2012-08-28 NOTE — Clinical Social Work Note (Signed)
Danielle [name] attended AM group.States she has been hearing voices for several weeks due to stress related to grief and loss.  Also admits to relapse on alcohol and cocaine.  Describes a chaotic home where grandchildren were recently removed by [employer].  Patient of Dr Ladona Ridgel at [facility].  Plans to return home, follow up outpt. Danielle [name] attended PM group on Wellness.  She participated in both exercises, and was an active participant in the discussion.  Identified listening to music and walking as stress reducers/part of her wellness plan.

## 2012-08-28 NOTE — Progress Notes (Signed)
Patient ID: Danielle [name], female   DOB: 1951/10/02, 61 y.o.   MRN: [id number] Patient ID: Danielle [name], female   DOB: 03-05-1951, 61 y.o.   MRN: [id number] [facility] MD Progress Note  08/27/2012 4:02 PM Danielle [name]  MRN:  [id number]  Diagnosis:  Schizophrenia, paranoid type  ADL's:  Intact  Sleep: Fair  Appetite:  Fair  Suicidal Ideation:  denies Homicidal Ideation:  denies  AEB (as evidenced by): Subjective: Met with the patient 1:1 to discuss her treatment and response to care. She reports no new symptoms and states she is feeling fine today. Mental Status Examination/Evaluation: Objective:  Appearance: Disheveled  Eye Contact::  Good  Speech:  goal directed  Volume:  Decreased  Mood:  Anxious and Depressed  Affect:  Non-Congruent  Thought Process:  Coherent  Orientation:  Full  Thought Content:  WDL  Suicidal Thoughts:  No  Homicidal Thoughts:  No  Memory:  Immediate;   Fair Recent;   Fair Remote;   Fair  Judgement:  Impaired  Insight:  Lacking  Psychomotor Activity:  Normal  Concentration:  Fair  Recall:  Fair  Akathisia:  No  Handed:    AIMS (if indicated):     Assets:  Social Support  Sleep:  Number of Hours: 6.75    Vital Signs:Blood pressure 134/81, pulse 87, temperature 97.3 F (36.3 C), resp. rate 20, height 5' (1.524 m), weight 63.05 kg (139 lb). Current Medications: Current Facility-Administered Medications  Medication Dose Route Frequency Provider Last Rate Last Dose  . acetaminophen (TYLENOL) tablet 650 mg  650 mg Oral Q6H PRN Nelly Rout, MD   650 mg at 08/26/12 0259  . alum & mag hydroxide-simeth (MAALOX/MYLANTA) 200-200-20 MG/5ML suspension 30 mL  30 mL Oral Q4H PRN Nelly Rout, MD   30 mL at 08/27/12 1542  . benztropine (COGENTIN) tablet 1 mg  1 mg Oral BID Verne Spurr, PA-C      . citalopram (CELEXA) tablet 40 mg  40 mg Oral Daily Nanine Means, NP   40 mg at 08/27/12 0804  . cloNIDine (CATAPRES) tablet 0.1 mg  0.1 mg Oral QID  Nanine Means, NP   0.1 mg at 08/27/12 1207   Followed by  . cloNIDine (CATAPRES) tablet 0.1 mg  0.1 mg Oral BH-qamhs Nanine Means, NP       Followed by  . cloNIDine (CATAPRES) tablet 0.1 mg  0.1 mg Oral QAC breakfast Nanine Means, NP      . dicyclomine (BENTYL) tablet 20 mg  20 mg Oral Q6H PRN Nanine Means, NP      . gabapentin (NEURONTIN) capsule 200 mg  200 mg Oral TID Verne Spurr, PA-C      . hydrocortisone cream 0.5 % 1 application  1 application Topical TID Mike Craze, MD   1 application at 08/27/12 0803  . hydrOXYzine (ATARAX/VISTARIL) tablet 25 mg  25 mg Oral Q6H PRN Nanine Means, NP      . loperamide (IMODIUM) capsule 2-4 mg  2-4 mg Oral PRN Nanine Means, NP      . magnesium hydroxide (MILK OF MAGNESIA) suspension 30 mL  30 mL Oral Daily PRN Nelly Rout, MD      . methocarbamol (ROBAXIN) tablet 500 mg  500 mg Oral Q8H PRN Nanine Means, NP      . naproxen (NAPROSYN) tablet 500 mg  500 mg Oral BID WC Nanine Means, NP   500 mg at 08/27/12 0804  . nicotine (NICODERM CQ - dosed in mg/24 hours)  patch 21 mg  21 mg Transdermal Q0600 Nanine Means, NP   21 mg at 08/27/12 4782  . ondansetron (ZOFRAN-ODT) disintegrating tablet 4 mg  4 mg Oral Q6H PRN Nanine Means, NP      . risperiDONE (RISPERDAL) tablet 1 mg  1 mg Oral BID Verne Spurr, PA-C      . traZODone (DESYREL) tablet 100 mg  100 mg Oral QHS Nanine Means, NP   100 mg at 08/26/12 2132  . [DISCONTINUED] benztropine (COGENTIN) tablet 0.5 mg  0.5 mg Oral BID Nanine Means, NP   0.5 mg at 08/27/12 0804  . [DISCONTINUED] gabapentin (NEURONTIN) capsule 100 mg  100 mg Oral TID Nanine Means, NP   100 mg at 08/27/12 1207  . [DISCONTINUED] risperiDONE (RISPERDAL) tablet 0.25 mg  0.25 mg Oral BID Mike Craze, MD   0.25 mg at 08/27/12 0804    Lab Results:  Results for orders placed during the hospital encounter of 08/25/12 (from the past 48 hour(s))  CBC WITH DIFFERENTIAL     Status: Abnormal   Collection Time   08/25/12  6:00 PM       Component Value Range Comment   WBC 7.1  4.0 - 10.5 K/uL    RBC 4.40  3.87 - 5.11 MIL/uL    Hemoglobin 13.1  12.0 - 15.0 g/dL    HCT 95.6  21.3 - 08.6 %    MCV 90.2  78.0 - 100.0 fL    MCH 29.8  26.0 - 34.0 pg    MCHC 33.0  30.0 - 36.0 g/dL    RDW 57.8  46.9 - 62.9 %    Platelets 252  150 - 400 K/uL    Neutrophils Relative 19 (*) 43 - 77 %    Neutro Abs 1.4 (*) 1.7 - 7.7 K/uL    Lymphocytes Relative 68 (*) 12 - 46 %    Lymphs Abs 4.8 (*) 0.7 - 4.0 K/uL    Monocytes Relative 10  3 - 12 %    Monocytes Absolute 0.7  0.1 - 1.0 K/uL    Eosinophils Relative 2  0 - 5 %    Eosinophils Absolute 0.1  0.0 - 0.7 K/uL    Basophils Relative 1  0 - 1 %    Basophils Absolute 0.0  0.0 - 0.1 K/uL   COMPREHENSIVE METABOLIC PANEL     Status: Abnormal   Collection Time   08/25/12  6:00 PM      Component Value Range Comment   Sodium 141  135 - 145 mEq/L    Potassium 4.2  3.5 - 5.1 mEq/L    Chloride 103  96 - 112 mEq/L    CO2 28  19 - 32 mEq/L    Glucose, Bld 96  70 - 99 mg/dL    BUN 19  6 - 23 mg/dL    Creatinine, Ser 5.28  0.50 - 1.10 mg/dL    Calcium 9.8  8.4 - 41.3 mg/dL    Total Protein 7.6  6.0 - 8.3 g/dL    Albumin 3.5  3.5 - 5.2 g/dL    AST 35  0 - 37 U/L    ALT 20  0 - 35 U/L    Alkaline Phosphatase 71  39 - 117 U/L    Total Bilirubin 0.5  0.3 - 1.2 mg/dL    GFR calc non Af Amer 73 (*) >90 mL/min    GFR calc Af Amer 84 (*) >90 mL/min   ETHANOL  Status: Normal   Collection Time   08/25/12  6:00 PM      Component Value Range Comment   Alcohol, Ethyl (B) <11  0 - 11 mg/dL   URINE RAPID DRUG SCREEN (HOSP PERFORMED)     Status: Abnormal   Collection Time   08/25/12  6:05 PM      Component Value Range Comment   Opiates NONE DETECTED  NONE DETECTED    Cocaine POSITIVE (*) NONE DETECTED    Benzodiazepines POSITIVE (*) NONE DETECTED    Amphetamines NONE DETECTED  NONE DETECTED    Tetrahydrocannabinol NONE DETECTED  NONE DETECTED    Barbiturates NONE DETECTED  NONE DETECTED    URINALYSIS, ROUTINE W REFLEX MICROSCOPIC     Status: Abnormal   Collection Time   08/25/12  6:05 PM      Component Value Range Comment   Color, Urine YELLOW  YELLOW    APPearance CLOUDY (*) CLEAR    Specific Gravity, Urine 1.033 (*) 1.005 - 1.030    pH 5.5  5.0 - 8.0    Glucose, UA NEGATIVE  NEGATIVE mg/dL    Hgb urine dipstick NEGATIVE  NEGATIVE    Bilirubin Urine NEGATIVE  NEGATIVE    Ketones, ur NEGATIVE  NEGATIVE mg/dL    Protein, ur NEGATIVE  NEGATIVE mg/dL    Urobilinogen, UA 1.0  0.0 - 1.0 mg/dL    Nitrite NEGATIVE  NEGATIVE    Leukocytes, UA MODERATE (*) NEGATIVE   URINE MICROSCOPIC-ADD ON     Status: Abnormal   Collection Time   08/25/12  6:05 PM      Component Value Range Comment   Squamous Epithelial / LPF MANY (*) RARE    WBC, UA 3-6  <3 WBC/hpf    RBC / HPF 0-2  <3 RBC/hpf    Bacteria, UA FEW (*) RARE   URINE CULTURE     Status: Normal   Collection Time   08/25/12  6:05 PM      Component Value Range Comment   Specimen Description URINE, CLEAN CATCH      Special Requests NONE      Culture  Setup Time 08/26/2012 02:34      Colony Count >=100,000 COLONIES/ML      Culture        Value: Multiple bacterial morphotypes present, none predominant. Suggest appropriate recollection if clinically indicated.   Report Status 08/27/2012 FINAL     AMMONIA     Status: Normal   Collection Time   08/25/12  8:34 PM      Component Value Range Comment   Ammonia 21  11 - 60 umol/L     Physical Findings: AIMS: Facial and Oral Movements Muscles of Facial Expression: Mild Lips and Perioral Area: Mild Jaw: Minimal Tongue: Minimal,Extremity Movements Upper (arms, wrists, hands, fingers): None, normal Lower (legs, knees, ankles, toes): None, normal, Trunk Movements Neck, shoulders, hips: Minimal, Overall Severity Severity of abnormal movements (highest score from questions above): Mild Incapacitation due to abnormal movements: None, normal Patient's awareness of abnormal movements  (rate only patient's report): Aware, no distress, Dental Status Current problems with teeth and/or dentures?: Yes (pt reports top dentures don't stay in) Does patient usually wear dentures?: Yes (wears only bottom plate to help chew her food)  CIWA:  CIWA-Ar Total: 1  COWS:  COWS Total Score: 2   Treatment Plan Summary: 1. Admit for crisis management and stabilization. 2. Medication management to reduce current symptoms to base line and improve  the patient's overall level of functioning 3. Treat health problems as indicated. 4. Develop treatment plan to decrease risk of relapse upon discharge and the need for readmission. 5. Psycho-social education regarding relapse prevention and self care. 6. Health care follow up as needed for medical problems. 7. Restart home medications where appropriate. Plan: 1. Will continue the current plan of care as written. 2. Would like to take injectable Risperdal for convenience. 3. Will initiate Risperdal Consta today. 4. Will anticipate D/C tomorrow if CM conversation with daughter goes well. Rona Ravens. Leander Tout PAC 08/27/2012, 4:02 PM

## 2012-08-28 NOTE — Progress Notes (Signed)
Patient ID: Danielle [name], female   DOB: 07/29/1951, 61 y.o.   MRN: [id number] [facility] MD Progress Note  08/28/2012 11:56 AM Danielle [name]  MRN:  [id number]  Diagnosis:  Schizophrenia, paranoid type  ADL's:  Intact  Sleep: Fair  Appetite:  Fair  Suicidal Ideation:  denies Homicidal Ideation:  denies  AEB (as evidenced by): Subjective: Met with Danielle [name] in treatment group meeting today. She reports feeling less anxious, depressed and paranoid. She states that she finally like her medication combination and would like to stay on it. She denies suicidal or homicidal ideations, intent or plan.  Mental Status Examination/Evaluation: Objective:  Appearance: Casual  Eye Contact::  Good  Speech:  goal directed  Volume:  Decreased  Mood:  Less depressed  Affect:  restricted  Thought Process:  Coherent  Orientation:  Full  Thought Content:  WDL  Suicidal Thoughts:  No  Homicidal Thoughts:  No  Memory:  Immediate;   Fair Recent;   Fair Remote;   Fair  Judgement:  Impaired  Insight:  marginal  Psychomotor Activity:  Normal  Concentration:  Fair  Recall:  Fair  Akathisia:  No  Handed:    AIMS (if indicated):     Assets:  Social Support  Sleep:  Number of Hours: 6.75    Vital Signs:Blood pressure 134/81, pulse 87, temperature 97.3 F (36.3 C), resp. rate 20, height 5' (1.524 m), weight 63.05 kg (139 lb). Current Medications: Current Facility-Administered Medications  Medication Dose Route Frequency Provider Last Rate Last Dose  . acetaminophen (TYLENOL) tablet 650 mg  650 mg Oral Q6H PRN Nelly Rout, MD   650 mg at 08/26/12 0259  . alum & mag hydroxide-simeth (MAALOX/MYLANTA) 200-200-20 MG/5ML suspension 30 mL  30 mL Oral Q4H PRN Nelly Rout, MD   30 mL at 08/27/12 1542  . benztropine (COGENTIN) tablet 1 mg  1 mg Oral BID Verne Spurr, PA-C      . citalopram (CELEXA) tablet 40 mg  40 mg Oral Daily Nanine Means, NP   40 mg at 08/27/12 0804  . cloNIDine (CATAPRES)  tablet 0.1 mg  0.1 mg Oral QID Nanine Means, NP   0.1 mg at 08/27/12 1207   Followed by  . cloNIDine (CATAPRES) tablet 0.1 mg  0.1 mg Oral BH-qamhs Nanine Means, NP       Followed by  . cloNIDine (CATAPRES) tablet 0.1 mg  0.1 mg Oral QAC breakfast Nanine Means, NP      . dicyclomine (BENTYL) tablet 20 mg  20 mg Oral Q6H PRN Nanine Means, NP      . gabapentin (NEURONTIN) capsule 200 mg  200 mg Oral TID Verne Spurr, PA-C      . hydrocortisone cream 0.5 % 1 application  1 application Topical TID Mike Craze, MD   1 application at 08/27/12 0803  . hydrOXYzine (ATARAX/VISTARIL) tablet 25 mg  25 mg Oral Q6H PRN Nanine Means, NP      . loperamide (IMODIUM) capsule 2-4 mg  2-4 mg Oral PRN Nanine Means, NP      . magnesium hydroxide (MILK OF MAGNESIA) suspension 30 mL  30 mL Oral Daily PRN Nelly Rout, MD      . methocarbamol (ROBAXIN) tablet 500 mg  500 mg Oral Q8H PRN Nanine Means, NP      . naproxen (NAPROSYN) tablet 500 mg  500 mg Oral BID WC Nanine Means, NP   500 mg at 08/27/12 0804  . nicotine (NICODERM CQ - dosed in  mg/24 hours) patch 21 mg  21 mg Transdermal Q0600 Nanine Means, NP   21 mg at 08/27/12 1914  . ondansetron (ZOFRAN-ODT) disintegrating tablet 4 mg  4 mg Oral Q6H PRN Nanine Means, NP      . risperiDONE (RISPERDAL) tablet 1 mg  1 mg Oral BID Verne Spurr, PA-C      . traZODone (DESYREL) tablet 100 mg  100 mg Oral QHS Nanine Means, NP   100 mg at 08/26/12 2132  . [DISCONTINUED] benztropine (COGENTIN) tablet 0.5 mg  0.5 mg Oral BID Nanine Means, NP   0.5 mg at 08/27/12 0804  . [DISCONTINUED] gabapentin (NEURONTIN) capsule 100 mg  100 mg Oral TID Nanine Means, NP   100 mg at 08/27/12 1207  . [DISCONTINUED] risperiDONE (RISPERDAL) tablet 0.25 mg  0.25 mg Oral BID Mike Craze, MD   0.25 mg at 08/27/12 0804    Lab Results:  Results for orders placed during the hospital encounter of 08/25/12 (from the past 48 hour(s))  CBC WITH DIFFERENTIAL     Status: Abnormal   Collection  Time   08/25/12  6:00 PM      Component Value Range Comment   WBC 7.1  4.0 - 10.5 K/uL    RBC 4.40  3.87 - 5.11 MIL/uL    Hemoglobin 13.1  12.0 - 15.0 g/dL    HCT 78.2  95.6 - 21.3 %    MCV 90.2  78.0 - 100.0 fL    MCH 29.8  26.0 - 34.0 pg    MCHC 33.0  30.0 - 36.0 g/dL    RDW 08.6  57.8 - 46.9 %    Platelets 252  150 - 400 K/uL    Neutrophils Relative 19 (*) 43 - 77 %    Neutro Abs 1.4 (*) 1.7 - 7.7 K/uL    Lymphocytes Relative 68 (*) 12 - 46 %    Lymphs Abs 4.8 (*) 0.7 - 4.0 K/uL    Monocytes Relative 10  3 - 12 %    Monocytes Absolute 0.7  0.1 - 1.0 K/uL    Eosinophils Relative 2  0 - 5 %    Eosinophils Absolute 0.1  0.0 - 0.7 K/uL    Basophils Relative 1  0 - 1 %    Basophils Absolute 0.0  0.0 - 0.1 K/uL   COMPREHENSIVE METABOLIC PANEL     Status: Abnormal   Collection Time   08/25/12  6:00 PM      Component Value Range Comment   Sodium 141  135 - 145 mEq/L    Potassium 4.2  3.5 - 5.1 mEq/L    Chloride 103  96 - 112 mEq/L    CO2 28  19 - 32 mEq/L    Glucose, Bld 96  70 - 99 mg/dL    BUN 19  6 - 23 mg/dL    Creatinine, Ser 6.29  0.50 - 1.10 mg/dL    Calcium 9.8  8.4 - 52.8 mg/dL    Total Protein 7.6  6.0 - 8.3 g/dL    Albumin 3.5  3.5 - 5.2 g/dL    AST 35  0 - 37 U/L    ALT 20  0 - 35 U/L    Alkaline Phosphatase 71  39 - 117 U/L    Total Bilirubin 0.5  0.3 - 1.2 mg/dL    GFR calc non Af Amer 73 (*) >90 mL/min    GFR calc Af Amer 84 (*) >90 mL/min  ETHANOL     Status: Normal   Collection Time   08/25/12  6:00 PM      Component Value Range Comment   Alcohol, Ethyl (B) <11  0 - 11 mg/dL   URINE RAPID DRUG SCREEN (HOSP PERFORMED)     Status: Abnormal   Collection Time   08/25/12  6:05 PM      Component Value Range Comment   Opiates NONE DETECTED  NONE DETECTED    Cocaine POSITIVE (*) NONE DETECTED    Benzodiazepines POSITIVE (*) NONE DETECTED    Amphetamines NONE DETECTED  NONE DETECTED    Tetrahydrocannabinol NONE DETECTED  NONE DETECTED    Barbiturates NONE  DETECTED  NONE DETECTED   URINALYSIS, ROUTINE W REFLEX MICROSCOPIC     Status: Abnormal   Collection Time   08/25/12  6:05 PM      Component Value Range Comment   Color, Urine YELLOW  YELLOW    APPearance CLOUDY (*) CLEAR    Specific Gravity, Urine 1.033 (*) 1.005 - 1.030    pH 5.5  5.0 - 8.0    Glucose, UA NEGATIVE  NEGATIVE mg/dL    Hgb urine dipstick NEGATIVE  NEGATIVE    Bilirubin Urine NEGATIVE  NEGATIVE    Ketones, ur NEGATIVE  NEGATIVE mg/dL    Protein, ur NEGATIVE  NEGATIVE mg/dL    Urobilinogen, UA 1.0  0.0 - 1.0 mg/dL    Nitrite NEGATIVE  NEGATIVE    Leukocytes, UA MODERATE (*) NEGATIVE   URINE MICROSCOPIC-ADD ON     Status: Abnormal   Collection Time   08/25/12  6:05 PM      Component Value Range Comment   Squamous Epithelial / LPF MANY (*) RARE    WBC, UA 3-6  <3 WBC/hpf    RBC / HPF 0-2  <3 RBC/hpf    Bacteria, UA FEW (*) RARE   URINE CULTURE     Status: Normal   Collection Time   08/25/12  6:05 PM      Component Value Range Comment   Specimen Description URINE, CLEAN CATCH      Special Requests NONE      Culture  Setup Time 08/26/2012 02:34      Colony Count >=100,000 COLONIES/ML      Culture        Value: Multiple bacterial morphotypes present, none predominant. Suggest appropriate recollection if clinically indicated.   Report Status 08/27/2012 FINAL     AMMONIA     Status: Normal   Collection Time   08/25/12  8:34 PM      Component Value Range Comment   Ammonia 21  11 - 60 umol/L     Physical Findings: AIMS: Facial and Oral Movements Muscles of Facial Expression: Mild Lips and Perioral Area: Mild Jaw: Minimal Tongue: Minimal,Extremity Movements Upper (arms, wrists, hands, fingers): None, normal Lower (legs, knees, ankles, toes): None, normal, Trunk Movements Neck, shoulders, hips: Minimal, Overall Severity Severity of abnormal movements (highest score from questions above): Mild Incapacitation due to abnormal movements: None, normal Patient's  awareness of abnormal movements (rate only patient's report): Aware, no distress, Dental Status Current problems with teeth and/or dentures?: Yes (pt reports top dentures don't stay in) Does patient usually wear dentures?: Yes (wears only bottom plate to help chew her food)  CIWA:  CIWA-Ar Total: 1  COWS:  COWS Total Score: 2   Treatment Plan Summary: 1. Admit for crisis management and stabilization. 2. Medication management to reduce current symptoms  to base line and improve the patient's overall level of functioning 3. Treat health problems as indicated. 4. Develop treatment plan to decrease risk of relapse upon discharge and the need for readmission. 5. Psycho-social education regarding relapse prevention and self care. 6. Health care follow up as needed for medical problems. 7. Restart home medications where appropriate. Plan: 1. Will continue the current plan of care. 2. Would like to take injectable Risperdal for convenience and medication compliance. 3. Will initiate Risperdal Consta today. 4. Will anticipate D/C tomorrow if CM conversation with daughter goes well.  Thedore Mins, MD 08/28/2012, 11:56 AM

## 2012-08-28 NOTE — Progress Notes (Signed)
Patient ID: Danielle [name], female   DOB: [birth year], 61 y.o.   MRN: [id number]  PER STATE REGULATIONS 482.30  THIS CHART WAS REVIEWED FOR MEDICAL NECESSITY WITH RESPECT TO THE PATIENT'S ADMISSION/ DURATION OF STAY.  NEXT REVIEW DATE:  08/31/2012 Willa Rough, RN, BSN CASE MANAGER

## 2012-08-28 NOTE — Progress Notes (Signed)
Patient ID: Danielle [name], female   DOB: [birth year], 61 y.o.   MRN: [id number] Patient presented to treatment team with depressed mood; reports auditory hallucinations.  Patient reports on self inventory that she has been having passive SI, however, she contracts for safety.  Patient has been compliant with her medications.  She recently had some grief issues and substance abuse.  She is agreeable to injectable risperdal.  She received her first risperdal consta this afternoon.   Continue to monitor medication management and MD orders.  Collaborate with treatment team members regarding patient's POC.  Continue 15 minute safety checks per protocol.  Patient's behavior has been appropriate on the unit.

## 2012-08-29 LAB — URINALYSIS, ROUTINE W REFLEX MICROSCOPIC
Bilirubin Urine: NEGATIVE
Glucose, UA: NEGATIVE mg/dL
Hgb urine dipstick: NEGATIVE
Ketones, ur: NEGATIVE mg/dL
Nitrite: NEGATIVE
Protein, ur: NEGATIVE mg/dL
Specific Gravity, Urine: 1.022 (ref 1.005–1.030)
Urobilinogen, UA: 0.2 mg/dL (ref 0.0–1.0)
pH: 5 (ref 5.0–8.0)

## 2012-08-29 LAB — URINE MICROSCOPIC-ADD ON

## 2012-08-29 MED ORDER — PANTOPRAZOLE SODIUM 40 MG PO TBEC
40.0000 mg | DELAYED_RELEASE_TABLET | Freq: Two times a day (BID) | ORAL | Status: DC
  Administered 2012-08-29 – 2012-08-30 (×2): 40 mg via ORAL
  Filled 2012-08-29 (×6): qty 1

## 2012-08-29 NOTE — Progress Notes (Signed)
Adult Psychosocial Assessment Update Interdisciplinary Team  [facility]discharges:  Admissions Discharges  Date:current Date:  Date:04/27/12 Date:  Date:12/25/10 Date:  Date: Date:  Date: Date:   Changes since the last Psychosocial Assessment (including adherence to outpatient mental health and/or substance abuse treatment, situational issues contributing to decompensation and/or relapse). Danielle [name] states she decompensated after her brother died a week ago.  She relapsed on alcohol and crack cocaine.  She has been sporadic with her attendance at [facility] appointments, and is generally noncompliant with her medications.  Insight limited.  Judgement poor.  Lives with daughter who recently lost custody of children.  Danielle [name]'s daughter is her payee, and Danielle [name] is questioning if that is really in her best interest.-             Discharge Plan 1. Will you be returning to the same living situation after discharge?   ZOX:WRUE daughter No:      If no, what is your plan?           2. Would you like a referral for services when you are discharged? Yes:     If yes, for what services? [facility]  No:              Summary and Recommendations (to be completed by the evaluator) Stabilization  Therapeutic milieu intervention.  Collateral information from daughter. Begin injectable medication due to chronic non-compliance  Refer to [employer]:  Danielle [name], 08/29/2012 6:09 PM

## 2012-08-29 NOTE — Progress Notes (Signed)
Pt reports she has had a fairly good day.  She states she has attended most of the groups.  She contracts for safety.  She denies HI/AV.  She reports that she may discharge tomorrow back to her daughter's house.  She wants to remain sober.  RN from earlier shift reported that a urine specimen had been collected today because a UA from a few days ago was questionable for a UTI.  Pt is reporting no symptoms.  Pt has been pleasant/appropriate.  She makes her needs known to staff.  Support/encouragement given.  Pt voices no needs at this time.  Safety maintained with q15 minute checks.

## 2012-08-29 NOTE — Progress Notes (Signed)
[facility] Adult Inpatient Family/Significant Other Suicide Prevention Education  Suicide Prevention Education:  Education Completed; Alphia Kava, daughter, [phone] has been identified by the patient as the family member/significant other with whom the patient will be residing, and identified as the person(s) who will aid the patient in the event of a mental health crisis (suicidal ideations/suicide attempt).  With written consent from the patient, the family member/significant other has been provided the following suicide prevention education, prior to the and/or following the discharge of the patient.  The suicide prevention education provided includes the following:  Suicide risk factors  Suicide prevention and interventions  National Suicide Hotline telephone number  [facility] assessment telephone number  [facility] Emergency Assistance 911  [facility] and/or Residential Mobile Crisis Unit telephone number  Request made of family/significant other to:  Remove weapons (e.g., guns, rifles, knives), all items previously/currently identified as safety concern.    Remove drugs/medications (over-the-counter, prescriptions, illicit drugs), all items previously/currently identified as a safety concern.  The family member/significant other verbalizes understanding of the suicide prevention education information provided.  The family member/significant other agrees to remove the items of safety concern listed above.  Daryel Gerald B 08/29/2012, 12:01 PM

## 2012-08-29 NOTE — Progress Notes (Signed)
Nutrition Brief Note  Patient identified on the Malnutrition Screening Tool (MST) Report  Body mass index is 27.15 kg/(m^2). Pt meets criteria for overweight based on current BMI.   Current diet order is Regular.  Labs and medications reviewed.   Pt unavailable at time of visit per RN.  Pt reported wt loss with poor appetite on admission.  RD notes pt's usual wt is 145 lbs representing a 4% wt change over the past year.  Wt loss is concerning, however not clinically significant.  RD also notes pt with reflux requiring medication management.  RD placed handout in discharge papers for pt re: nutrition-related therapy for GERD. If nutrition issues arise, please consult RD.   Loyce Dys, MS RD LDN Clinical Inpatient Dietitian Pager: [phone] Weekend/After hours pager: [phone]

## 2012-08-29 NOTE — Progress Notes (Signed)
Patient ID: Danielle [name], female   DOB: 04-27-1951, 62 y.o.   MRN: [id number] Patient presents with improved mood; less depression.  She has passive SI, however, contracts for safety on the unit.  She complains of indigestion and nausea which she attributes to acid reflux after eating her meals.  protonix 40 mg has been ordered per PA instructions.  She also has tolerated maalox.  She denies any HI/AVH at this time.  She has been attending groups and participating.  She is motivated for treatment.  Continue to monitor for medication treatment and MD orders.  Collaborate with treatment team members regarding patient's POC.  Safety checks continues every 15 minutes per protocol.  Patient's behavior has been appropriate; reassure and encourage patient.

## 2012-08-29 NOTE — Progress Notes (Signed)
Pt came into the dayroom for snack.  She was asleep earlier and did not attend evening group.  She reports she received a shot earlier in the day(Risperdal Consta) and it made her sleepy.  She reports she may go stay with her daughter at discharge.  She contracts for safety.  She denies HI/AV at this time.  Pt is encouraged to make her needs known to staff.  Pt voices no needs at this time.  Pt given support/encouragement.  Safety maintained with q15 minute checks.

## 2012-08-30 DIAGNOSIS — F1910 Other psychoactive substance abuse, uncomplicated: Secondary | ICD-10-CM

## 2012-08-30 MED ORDER — HYDROXYZINE HCL 25 MG PO TABS: 25.0000 mg | ORAL_TABLET | Freq: Four times a day (QID) | ORAL | Status: DC | PRN

## 2012-08-30 MED ORDER — TRAZODONE HCL 100 MG PO TABS
100.0000 mg | ORAL_TABLET | Freq: Every day | ORAL | Status: DC
  Filled 2012-08-30: qty 1

## 2012-08-30 MED ORDER — CITALOPRAM HYDROBROMIDE 40 MG PO TABS: 40.0000 mg | ORAL_TABLET | Freq: Every day | ORAL | Status: DC

## 2012-08-30 MED ORDER — NAPROXEN 500 MG PO TABS: 500.0000 mg | ORAL_TABLET | Freq: Two times a day (BID) | ORAL | Status: DC

## 2012-08-30 MED ORDER — BENZTROPINE MESYLATE 1 MG PO TABS: 1.0000 mg | ORAL_TABLET | Freq: Two times a day (BID) | ORAL | Status: DC

## 2012-08-30 MED ORDER — HYDROCORTISONE 0.5 % EX CREA: 1.0000 "application " | TOPICAL_CREAM | Freq: Three times a day (TID) | CUTANEOUS | Status: DC

## 2012-08-30 MED ORDER — PANTOPRAZOLE SODIUM 40 MG PO TBEC: 40.0000 mg | DELAYED_RELEASE_TABLET | Freq: Two times a day (BID) | ORAL | Status: DC

## 2012-08-30 MED ORDER — HYDROXYZINE HCL 25 MG PO TABS
25.0000 mg | ORAL_TABLET | Freq: Four times a day (QID) | ORAL | Status: DC | PRN
  Filled 2012-08-30: qty 56

## 2012-08-30 MED ORDER — PANTOPRAZOLE SODIUM 40 MG PO TBEC
40.0000 mg | DELAYED_RELEASE_TABLET | Freq: Two times a day (BID) | ORAL | Status: DC
  Administered 2012-08-30: 40 mg via ORAL
  Filled 2012-08-30 (×3): qty 1

## 2012-08-30 MED ORDER — ALBUTEROL SULFATE HFA 108 (90 BASE) MCG/ACT IN AERS: 2.0000 | INHALATION_SPRAY | Freq: Four times a day (QID) | RESPIRATORY_TRACT | Status: DC | PRN

## 2012-08-30 MED ORDER — RISPERIDONE MICROSPHERES 25 MG IM SUSR: 25.0000 mg | INTRAMUSCULAR | Status: DC

## 2012-08-30 MED ORDER — GABAPENTIN 100 MG PO CAPS: 200.0000 mg | ORAL_CAPSULE | Freq: Three times a day (TID) | ORAL | Status: DC

## 2012-08-30 MED ORDER — TRAZODONE HCL 100 MG PO TABS: ORAL_TABLET | ORAL | Status: DC

## 2012-08-30 MED ORDER — RISPERIDONE 1 MG PO TABS: 1.0000 mg | ORAL_TABLET | Freq: Two times a day (BID) | ORAL | Status: DC

## 2012-08-30 NOTE — Progress Notes (Signed)
The focus of this group is to help patients review their daily goal of treatment and discuss progress on daily workbooks. Pt attended the evening group and participated in the discussion about positive thinking and goals.

## 2012-08-30 NOTE — Discharge Summary (Signed)
Physician Discharge Summary Note  Patient:  Danielle [name] is an 61 y.o., female MRN:  [id number] DOB:  1951-07-24 Patient phone:  [phone] (home)  Patient address:   [address] [place] [address],   Date of Admission:  08/26/2012 Date of Discharge: 08/30/2012  Reason for Admission: psychosis   Discharge Diagnoses: Principal Problem:  *Schizophrenia, paranoid type   Axis Diagnosis:  Discharge Diagnoses:  AXIS I: Chronic Paranoid Schizophrenia and Substance Abuse  AXIS II: Deferred  AXIS III:  Past Medical History   Diagnosis  Date   .  Hypertension    .  Asthma    .  Stroke    .  Coronary artery disease    .  Tardive dyskinesia      Recent fall with residual knee and back pain.   AXIS IV: other psychosocial or environmental problems  AXIS V: 61-70 mild symptoms   Level of Care:  OP  Hospital Course:  Danielle [name] was admitted for psychosis and substance abuse with visual and auditory command hallucinations directing her to jump off a bridge.  She was treated with a Librium detox protocol to reduce her symptoms of alcohol withdrawal.       The symptoms of psychosis were treated with Risperdal 1mg  po BID and she was covered for side effects with cogentin.  For sleep she was given Trazodone 150mg .  Danielle [name] was encouraged to participate in unit an group programming and was evaluated by clinicians on a daily basis.     Her reduction in symptoms and response to treatment was monitored daily by her completion of a  self assessment.  She was also interested in having an injectable medication for her symptoms of psychosis as non compliance has been an issue for her in the past.     She was given Risperdal consta injection on 08/27/2012. She did well and had no side effects.  Danielle [name] did have some nausea which was ongoing prior to her admission and was treated with Protonix 40mg  BID.  Consults:  None  Significant Diagnostic Studies:  None  Discharge Vitals:   Blood  pressure 129/85, pulse 92, temperature 98.1 F (36.7 C), temperature source Oral, resp. rate 18, height 5' (1.524 m), weight 63.05 kg (139 lb). Lab Results:   Results for orders placed during the hospital encounter of 08/26/12 (from the past 72 hour(s))  URINALYSIS, ROUTINE W REFLEX MICROSCOPIC     Status: Abnormal   Collection Time   08/29/12 12:52 PM      Component Value Range Comment   Color, Urine YELLOW  YELLOW    APPearance CLOUDY (*) CLEAR    Specific Gravity, Urine 1.022  1.005 - 1.030    pH 5.0  5.0 - 8.0    Glucose, UA NEGATIVE  NEGATIVE mg/dL    Hgb urine dipstick NEGATIVE  NEGATIVE    Bilirubin Urine NEGATIVE  NEGATIVE    Ketones, ur NEGATIVE  NEGATIVE mg/dL    Protein, ur NEGATIVE  NEGATIVE mg/dL    Urobilinogen, UA 0.2  0.0 - 1.0 mg/dL    Nitrite NEGATIVE  NEGATIVE    Leukocytes, UA MODERATE (*) NEGATIVE   URINE MICROSCOPIC-ADD ON     Status: Abnormal   Collection Time   08/29/12 12:52 PM      Component Value Range Comment   Squamous Epithelial / LPF MANY (*) RARE    WBC, UA 7-10  <3 WBC/hpf    Bacteria, UA MANY (*) RARE     Physical  Findings: AIMS: Facial and Oral Movements Muscles of Facial Expression: Mild Lips and Perioral Area: Mild Jaw: Minimal Tongue: Minimal,Extremity Movements Upper (arms, wrists, hands, fingers): None, normal Lower (legs, knees, ankles, toes): None, normal, Trunk Movements Neck, shoulders, hips: Minimal, Overall Severity Severity of abnormal movements (highest score from questions above): Mild Incapacitation due to abnormal movements: None, normal Patient's awareness of abnormal movements (rate only patient's report): Aware, no distress, Dental Status Current problems with teeth and/or dentures?: Yes (pt reports top dentures don't stay in) Does patient usually wear dentures?: Yes (wears only bottom plate to help chew her food)  CIWA:  CIWA-Ar Total: 0  COWS:  COWS Total Score: 0   Mental Status Exam: See Mental Status Examination  and Suicide Risk Assessment completed by Attending Physician prior to discharge.  Discharge destination:  Home  Is patient on multiple antipsychotic therapies at discharge:  No   Has Patient had three or more failed trials of antipsychotic monotherapy by history:  No  Recommended Plan for Multiple Antipsychotic Therapies: Not applicable  Discharge Orders    Future Orders Please Complete By Expires   Diet - low sodium heart healthy      Increase activity slowly      Discharge instructions      Comments:   Take all of your medications as prescribed.  Be sure to keep ALL follow up appointments as scheduled. This is to ensure getting your refills on time to avoid any interruption in your medication.  If you find that you can not keep your appointment, call the clinic and reschedule. Be sure to tell the nurse if you will need a refill before your appointment.   Medication List   As of 08/30/2012 11:05 AM  STOP taking these medications         diphenhydrAMINE 25 MG tablet   Commonly known as: BENADRYL    TAKE these medications      Indication    albuterol 108 (90 BASE) MCG/ACT inhaler   Commonly known as: PROVENTIL HFA;VENTOLIN HFA   Inhale 2 puffs into the lungs every 6 (six) hours as needed.    Indication: Asthma      benztropine 1 MG tablet   Commonly known as: COGENTIN   Take 1 tablet (1 mg total) by mouth 2 (two) times daily.    Indication: Extrapyramidal Reaction caused by Medications      citalopram 40 MG tablet   Commonly known as: CELEXA   Take 1 tablet (40 mg total) by mouth daily. For depression and anxiety.    Indication: Depression      gabapentin 100 MG capsule   Commonly known as: NEURONTIN   Take 2 capsules (200 mg total) by mouth 3 (three) times daily.    Indication: Agitation      hydrocortisone cream 0.5 %   Apply 1 application topically 3 (three) times daily.       hydrOXYzine 25 MG tablet   Commonly known as: ATARAX/VISTARIL   Take 1 tablet (25 mg  total) by mouth every 6 (six) hours as needed for anxiety.    Indication: Anxiety Neurosis, Tension      naproxen 500 MG tablet   Commonly known as: NAPROSYN   Take 1 tablet (500 mg total) by mouth 2 (two) times daily with a meal.    Indication: Joint Damage causing Pain and Loss of Function      pantoprazole 40 MG tablet   Commonly known as: PROTONIX   Take 1  tablet (40 mg total) by mouth 2 (two) times daily before a meal.    Indication: Gastroesophageal Reflux Disease      risperiDONE 1 MG tablet   Commonly known as: RISPERDAL   Take 1 tablet (1 mg total) by mouth 2 (two) times daily.    Indication: Schizophrenia, tardive dyskinesia      risperiDONE microspheres 25 MG injection   Commonly known as: RISPERDAL CONSTA   Inject 2 mLs (25 mg total) into the muscle every 14 (fourteen) days. Next dose due on 09/11/2012    Indication: Schizophrenia      traZODone 100 MG tablet   Commonly known as: DESYREL   Take one tablet each day at bedtime for insomnia.    Indication: Trouble Sleeping         Follow-up Information    Follow up with [facility]. (Go in on Tuesday, 11/19, at 8 AM for your hospital follow up appointment)    Contact information:   [address]  [place]  [phone]         Follow-up recommendations:  As noted above.  Comments:    Signed: Lloyd Huger T. Tamara Kenyon [facility] 08/30/2012

## 2012-08-30 NOTE — Progress Notes (Signed)
Patient ID: Danielle [name], female   DOB: [birth year], 61 y.o.   MRN: [id number] [facility] MD Progress Note  08/27/2012 4:02 PM Danielle [name]  MRN:  [id number]  Diagnosis:  Schizophrenia, paranoid type  ADL's:  Intact  Sleep: Fair  Appetite:  Fair  Suicidal Ideation:  denies Homicidal Ideation:  denies  AEB (as evidenced by): Subjective: Met with the patient 1:1 to discuss her treatment and response to care. She reports no new symptoms and states she is feeling fine today. Mental Status Examination/Evaluation: Objective:  Appearance: Disheveled  Eye Contact::  Good  Speech:  goal directed  Volume:  Decreased  Mood:  Anxious and Depressed  Affect:  Non-Congruent  Thought Process:  Coherent  Orientation:  Full  Thought Content:  WDL  Suicidal Thoughts:  No  Homicidal Thoughts:  No  Memory:  Immediate;   Fair Recent;   Fair Remote;   Fair  Judgement:  Impaired  Insight:  Lacking  Psychomotor Activity:  Normal  Concentration:  Fair  Recall:  Fair  Akathisia:  No  Handed:    AIMS (if indicated):     Assets:  Social Support  Sleep:  Number of Hours: 6.75    Vital Signs:Blood pressure 134/81, pulse 87, temperature 97.3 F (36.3 C), resp. rate 20, height 5' (1.524 m), weight 63.05 kg (139 lb). Current Medications: Current Facility-Administered Medications  Medication Dose Route Frequency Provider Last Rate Last Dose  . acetaminophen (TYLENOL) tablet 650 mg  650 mg Oral Q6H PRN Nelly Rout, MD   650 mg at 08/26/12 0259  . alum & mag hydroxide-simeth (MAALOX/MYLANTA) 200-200-20 MG/5ML suspension 30 mL  30 mL Oral Q4H PRN Nelly Rout, MD   30 mL at 08/27/12 1542  . benztropine (COGENTIN) tablet 1 mg  1 mg Oral BID Verne Spurr, PA-C      . citalopram (CELEXA) tablet 40 mg  40 mg Oral Daily Nanine Means, NP   40 mg at 08/27/12 0804  . cloNIDine (CATAPRES) tablet 0.1 mg  0.1 mg Oral QID Nanine Means, NP   0.1 mg at 08/27/12 1207   Followed by  . cloNIDine (CATAPRES)  tablet 0.1 mg  0.1 mg Oral BH-qamhs Nanine Means, NP       Followed by  . cloNIDine (CATAPRES) tablet 0.1 mg  0.1 mg Oral QAC breakfast Nanine Means, NP      . dicyclomine (BENTYL) tablet 20 mg  20 mg Oral Q6H PRN Nanine Means, NP      . gabapentin (NEURONTIN) capsule 200 mg  200 mg Oral TID Verne Spurr, PA-C      . hydrocortisone cream 0.5 % 1 application  1 application Topical TID Mike Craze, MD   1 application at 08/27/12 0803  . hydrOXYzine (ATARAX/VISTARIL) tablet 25 mg  25 mg Oral Q6H PRN Nanine Means, NP      . loperamide (IMODIUM) capsule 2-4 mg  2-4 mg Oral PRN Nanine Means, NP      . magnesium hydroxide (MILK OF MAGNESIA) suspension 30 mL  30 mL Oral Daily PRN Nelly Rout, MD      . methocarbamol (ROBAXIN) tablet 500 mg  500 mg Oral Q8H PRN Nanine Means, NP      . naproxen (NAPROSYN) tablet 500 mg  500 mg Oral BID WC Nanine Means, NP   500 mg at 08/27/12 0804  . nicotine (NICODERM CQ - dosed in mg/24 hours) patch 21 mg  21 mg Transdermal Q0600 Nanine Means, NP   21 mg  at 08/27/12 1610  . ondansetron (ZOFRAN-ODT) disintegrating tablet 4 mg  4 mg Oral Q6H PRN Nanine Means, NP      . risperiDONE (RISPERDAL) tablet 1 mg  1 mg Oral BID Verne Spurr, PA-C      . traZODone (DESYREL) tablet 100 mg  100 mg Oral QHS Nanine Means, NP   100 mg at 08/26/12 2132  . [DISCONTINUED] benztropine (COGENTIN) tablet 0.5 mg  0.5 mg Oral BID Nanine Means, NP   0.5 mg at 08/27/12 0804  . [DISCONTINUED] gabapentin (NEURONTIN) capsule 100 mg  100 mg Oral TID Nanine Means, NP   100 mg at 08/27/12 1207  . [DISCONTINUED] risperiDONE (RISPERDAL) tablet 0.25 mg  0.25 mg Oral BID Mike Craze, MD   0.25 mg at 08/27/12 0804    Lab Results:  Results for orders placed during the hospital encounter of 08/25/12 (from the past 48 hour(s))  CBC WITH DIFFERENTIAL     Status: Abnormal   Collection Time   08/25/12  6:00 PM      Component Value Range Comment   WBC 7.1  4.0 - 10.5 K/uL    RBC 4.40  3.87 - 5.11  MIL/uL    Hemoglobin 13.1  12.0 - 15.0 g/dL    HCT 96.0  45.4 - 09.8 %    MCV 90.2  78.0 - 100.0 fL    MCH 29.8  26.0 - 34.0 pg    MCHC 33.0  30.0 - 36.0 g/dL    RDW 11.9  14.7 - 82.9 %    Platelets 252  150 - 400 K/uL    Neutrophils Relative 19 (*) 43 - 77 %    Neutro Abs 1.4 (*) 1.7 - 7.7 K/uL    Lymphocytes Relative 68 (*) 12 - 46 %    Lymphs Abs 4.8 (*) 0.7 - 4.0 K/uL    Monocytes Relative 10  3 - 12 %    Monocytes Absolute 0.7  0.1 - 1.0 K/uL    Eosinophils Relative 2  0 - 5 %    Eosinophils Absolute 0.1  0.0 - 0.7 K/uL    Basophils Relative 1  0 - 1 %    Basophils Absolute 0.0  0.0 - 0.1 K/uL   COMPREHENSIVE METABOLIC PANEL     Status: Abnormal   Collection Time   08/25/12  6:00 PM      Component Value Range Comment   Sodium 141  135 - 145 mEq/L    Potassium 4.2  3.5 - 5.1 mEq/L    Chloride 103  96 - 112 mEq/L    CO2 28  19 - 32 mEq/L    Glucose, Bld 96  70 - 99 mg/dL    BUN 19  6 - 23 mg/dL    Creatinine, Ser 5.62  0.50 - 1.10 mg/dL    Calcium 9.8  8.4 - 13.0 mg/dL    Total Protein 7.6  6.0 - 8.3 g/dL    Albumin 3.5  3.5 - 5.2 g/dL    AST 35  0 - 37 U/L    ALT 20  0 - 35 U/L    Alkaline Phosphatase 71  39 - 117 U/L    Total Bilirubin 0.5  0.3 - 1.2 mg/dL    GFR calc non Af Amer 73 (*) >90 mL/min    GFR calc Af Amer 84 (*) >90 mL/min   ETHANOL     Status: Normal   Collection Time   08/25/12  6:00  PM      Component Value Range Comment   Alcohol, Ethyl (B) <11  0 - 11 mg/dL   URINE RAPID DRUG SCREEN (HOSP PERFORMED)     Status: Abnormal   Collection Time   08/25/12  6:05 PM      Component Value Range Comment   Opiates NONE DETECTED  NONE DETECTED    Cocaine POSITIVE (*) NONE DETECTED    Benzodiazepines POSITIVE (*) NONE DETECTED    Amphetamines NONE DETECTED  NONE DETECTED    Tetrahydrocannabinol NONE DETECTED  NONE DETECTED    Barbiturates NONE DETECTED  NONE DETECTED   URINALYSIS, ROUTINE W REFLEX MICROSCOPIC     Status: Abnormal   Collection Time   08/25/12   6:05 PM      Component Value Range Comment   Color, Urine YELLOW  YELLOW    APPearance CLOUDY (*) CLEAR    Specific Gravity, Urine 1.033 (*) 1.005 - 1.030    pH 5.5  5.0 - 8.0    Glucose, UA NEGATIVE  NEGATIVE mg/dL    Hgb urine dipstick NEGATIVE  NEGATIVE    Bilirubin Urine NEGATIVE  NEGATIVE    Ketones, ur NEGATIVE  NEGATIVE mg/dL    Protein, ur NEGATIVE  NEGATIVE mg/dL    Urobilinogen, UA 1.0  0.0 - 1.0 mg/dL    Nitrite NEGATIVE  NEGATIVE    Leukocytes, UA MODERATE (*) NEGATIVE   URINE MICROSCOPIC-ADD ON     Status: Abnormal   Collection Time   08/25/12  6:05 PM      Component Value Range Comment   Squamous Epithelial / LPF MANY (*) RARE    WBC, UA 3-6  <3 WBC/hpf    RBC / HPF 0-2  <3 RBC/hpf    Bacteria, UA FEW (*) RARE   URINE CULTURE     Status: Normal   Collection Time   08/25/12  6:05 PM      Component Value Range Comment   Specimen Description URINE, CLEAN CATCH      Special Requests NONE      Culture  Setup Time 08/26/2012 02:34      Colony Count >=100,000 COLONIES/ML      Culture        Value: Multiple bacterial morphotypes present, none predominant. Suggest appropriate recollection if clinically indicated.   Report Status 08/27/2012 FINAL     AMMONIA     Status: Normal   Collection Time   08/25/12  8:34 PM      Component Value Range Comment   Ammonia 21  11 - 60 umol/L     Physical Findings: AIMS: Facial and Oral Movements Muscles of Facial Expression: Mild Lips and Perioral Area: Mild Jaw: Minimal Tongue: Minimal,Extremity Movements Upper (arms, wrists, hands, fingers): None, normal Lower (legs, knees, ankles, toes): None, normal, Trunk Movements Neck, shoulders, hips: Minimal, Overall Severity Severity of abnormal movements (highest score from questions above): Mild Incapacitation due to abnormal movements: None, normal Patient's awareness of abnormal movements (rate only patient's report): Aware, no distress, Dental Status Current problems with teeth  and/or dentures?: Yes (pt reports top dentures don't stay in) Does patient usually wear dentures?: Yes (wears only bottom plate to help chew her food)  CIWA:  CIWA-Ar Total: 1  COWS:  COWS Total Score: 2   Treatment Plan Summary: 1. Admit for crisis management and stabilization. 2. Medication management to reduce current symptoms to base line and improve the patient's overall level of functioning 3. Treat health problems as  indicated. 4. Develop treatment plan to decrease risk of relapse upon discharge and the need for readmission. 5. Psycho-social education regarding relapse prevention and self care. 6. Health care follow up as needed for medical problems. 7. Restart home medications where appropriate. Plan: 1. Will continue the current plan of care as written. 2. Would consider an injectable medication if non-compliance is a problem. 3. continue cogentin to 1mg  BID due to the history of TD. 4. continue the Respiradol to 1mg  BID. 5. Also increased neurontin to 200mg  TID for anxiety. 6. protonix 40mg  BID for nausea. Rona Ravens. [address] [facility] 08/29/2012

## 2012-08-30 NOTE — Progress Notes (Signed)
Pt discharged per MD orders; pt currently denies SI/HI and auditory/visual hallucinations; pt was given education by RN regarding follow-up appointments and medications and pt denied any questions or concerns about these instructions; pt was then escorted to search room to retrieve her belongings by RN before being discharged to hospital lobby. 

## 2012-08-30 NOTE — [facility] Suicide Risk Assessment (Signed)
Suicide Risk Assessment  Discharge Assessment     Demographic Factors:  female  Mental Status Per Nursing Assessment::   On Admission:  Suicidal ideation indicated by patient;Self-harm thoughts;Belief that plan would result in death  Current Mental Status by Physician: patient denies suicidal ideations, intent or plan  Loss Factors: Not applicable  Historical Factors: patient reports history of suicide ideations  Risk Reduction Factors:   Living with another person, especially a relative, Positive social support and Positive therapeutic relationship  Continued Clinical Symptoms:  Patient reports resolving depressive mood  Cognitive Features That Contribute To Risk:  N/A    Suicide Risk:  Minimal: No identifiable suicidal ideation.  Patients presenting with no risk factors but with morbid ruminations; may be classified as minimal risk based on the severity of the depressive symptoms  Discharge Diagnoses:   AXIS I:  Chronic Paranoid Schizophrenia and Substance Abuse AXIS II:  Deferred AXIS III:   Past Medical History  Diagnosis Date  . Hypertension   . Asthma   . Stroke   . Coronary artery disease   . Tardive dyskinesia     Recent fall with residual knee and back pain.   AXIS IV:  other psychosocial or environmental problems AXIS V:  61-70 mild symptoms  Plan Of Care/Follow-up recommendations:  Activity:  as tolerated Diet:  healthy Tests:  routine Other:  Patient to keep after care appointment as recommended  Is patient on multiple antipsychotic therapies at discharge:  No   Has Patient had three or more failed trials of antipsychotic monotherapy by history:  No  Recommended Plan for Multiple Antipsychotic Therapies: N/A  Thedore Mins, MD 08/30/2012, 9:27 AM

## 2012-08-30 NOTE — Progress Notes (Signed)
[facility] Case Management Discharge Plan:  Will you be returning to the same living situation after discharge: Yes,  home with daughter At discharge, do you have transportation home?:Yes,  daughter Do you have the ability to pay for your medications:Yes,  mental health  Interagency Information:     Release of information consent forms completed and in the chart;  Patient's signature needed at discharge.  Patient to Follow up at:  Follow-up Information    Follow up with [facility]. (Go in on Tuesday, 11/19, at 8 AM for your hospital follow up appointment)    Contact information:   [address]  [place]  [phone]         Patient denies SI/HI:   Yes,  yes    Safety Planning and Suicide Prevention discussed:  Yes,  yes  Barrier to discharge identified:No.  Summary and Recommendations:   Danielle [name] 08/30/2012, 10:28 AM

## 2012-08-31 LAB — URINE CULTURE: Colony Count: 25000

## 2012-09-01 NOTE — Discharge Summary (Signed)
Seen and agreed. Baley Shands, MD 

## 2012-09-04 NOTE — Progress Notes (Signed)
Patient Discharge Instructions:  After Visit Summary (AVS):   Faxed to:  09/04/12 Psychiatric Admission Assessment Note:   Faxed to:  09/04/12 Suicide Risk Assessment - Discharge Assessment:   Faxed to:  09/04/12 Faxed/Sent to the Next Level Care provider:  09/04/12 Faxed to [facility] @ [phone]  Jerelene Redden, 09/04/2012, 3:25 PM

## 2013-08-22 ENCOUNTER — Encounter (HOSPITAL_COMMUNITY): Payer: Self-pay | Admitting: Emergency Medicine

## 2013-08-22 ENCOUNTER — Emergency Department (HOSPITAL_COMMUNITY)
Admission: EM | Admit: 2013-08-22 | Discharge: 2013-08-22 | Disposition: A | Discharge status: Home / Self Care | Payer: Medicare Other | Attending: Emergency Medicine | Admitting: Emergency Medicine

## 2013-08-22 DIAGNOSIS — T50991A Poisoning by other drugs, medicaments and biological substances, accidental (unintentional), initial encounter: Secondary | ICD-10-CM | POA: Insufficient documentation

## 2013-08-22 DIAGNOSIS — Y939 Activity, unspecified: Secondary | ICD-10-CM | POA: Insufficient documentation

## 2013-08-22 DIAGNOSIS — L299 Pruritus, unspecified: Secondary | ICD-10-CM | POA: Insufficient documentation

## 2013-08-22 DIAGNOSIS — Z8673 Personal history of transient ischemic attack (TIA), and cerebral infarction without residual deficits: Secondary | ICD-10-CM | POA: Insufficient documentation

## 2013-08-22 DIAGNOSIS — J45909 Unspecified asthma, uncomplicated: Secondary | ICD-10-CM | POA: Insufficient documentation

## 2013-08-22 DIAGNOSIS — Z882 Allergy status to sulfonamides status: Secondary | ICD-10-CM | POA: Insufficient documentation

## 2013-08-22 DIAGNOSIS — Z888 Allergy status to other drugs, medicaments and biological substances status: Secondary | ICD-10-CM | POA: Insufficient documentation

## 2013-08-22 DIAGNOSIS — F3131 Bipolar disorder, current episode depressed, mild: Secondary | ICD-10-CM | POA: Insufficient documentation

## 2013-08-22 DIAGNOSIS — Y929 Unspecified place or not applicable: Secondary | ICD-10-CM | POA: Insufficient documentation

## 2013-08-22 DIAGNOSIS — R220 Localized swelling, mass and lump, head: Secondary | ICD-10-CM | POA: Insufficient documentation

## 2013-08-22 DIAGNOSIS — R0602 Shortness of breath: Secondary | ICD-10-CM | POA: Insufficient documentation

## 2013-08-22 DIAGNOSIS — Z880 Allergy status to penicillin: Secondary | ICD-10-CM | POA: Insufficient documentation

## 2013-08-22 DIAGNOSIS — T783XXA Angioneurotic edema, initial encounter: Secondary | ICD-10-CM | POA: Insufficient documentation

## 2013-08-22 DIAGNOSIS — I10 Essential (primary) hypertension: Secondary | ICD-10-CM | POA: Insufficient documentation

## 2013-08-22 DIAGNOSIS — I2510 Atherosclerotic heart disease of native coronary artery without angina pectoris: Secondary | ICD-10-CM | POA: Insufficient documentation

## 2013-08-22 DIAGNOSIS — F17200 Nicotine dependence, unspecified, uncomplicated: Secondary | ICD-10-CM | POA: Insufficient documentation

## 2013-08-22 MED ORDER — AMLODIPINE BESYLATE 5 MG PO TABS: 5.0000 mg | ORAL_TABLET | Freq: Every day | ORAL | Status: DC

## 2013-08-22 MED ORDER — DEXAMETHASONE SODIUM PHOSPHATE 10 MG/ML IJ SOLN
20.0000 mg | Freq: Once | INTRAMUSCULAR | Status: AC
  Administered 2013-08-22: 20 mg via INTRAVENOUS
  Filled 2013-08-22: qty 2

## 2013-08-22 MED ORDER — AMLODIPINE BESYLATE 5 MG PO TABS
5.0000 mg | ORAL_TABLET | Freq: Once | ORAL | Status: AC
  Administered 2013-08-22: 5 mg via ORAL
  Filled 2013-08-22: qty 1

## 2013-08-22 MED ORDER — DIPHENHYDRAMINE HCL 50 MG/ML IJ SOLN
25.0000 mg | Freq: Once | INTRAMUSCULAR | Status: AC
  Administered 2013-08-22: 25 mg via INTRAVENOUS
  Filled 2013-08-22: qty 1

## 2013-08-22 MED ORDER — SODIUM CHLORIDE 0.9 % IV BOLUS (SEPSIS)
1000.0000 mL | Freq: Once | INTRAVENOUS | Status: AC
  Administered 2013-08-22: 1000 mL via INTRAVENOUS

## 2013-08-22 MED ORDER — FAMOTIDINE IN NACL 20-0.9 MG/50ML-% IV SOLN
20.0000 mg | Freq: Once | INTRAVENOUS | Status: AC
  Administered 2013-08-22: 20 mg via INTRAVENOUS
  Filled 2013-08-22: qty 50

## 2013-08-22 NOTE — ED Notes (Signed)
MD aware of bp. Pt was taking HCTZ but hasnt taken any in months. MD will write for prescription.

## 2013-08-22 NOTE — ED Provider Notes (Addendum)
CSN: [id number]     Arrival date & time 08/22/13  0010 History   First MD Initiated Contact with Patient 08/22/13 0126     Chief Complaint  Patient presents with  . Angioedema   (Consider location/radiation/quality/duration/timing/severity/associated sxs/prior Treatment) The history is provided by the patient.  pt states hx angioedema, and tonight after taking a bc powder noted swelling of lips and tongue. Moderate. States hx same, although not w bc. Symptoms constant since onset, no specific exacerbating or alleviating factors. No trouble breathing or swallowing. No faintness or dizziness. No cp. No wheezing. No rash or hives, but states feels itchy. Denies any current bp med use. No other recent change in meds. Denies change in home or personal products. Denies change in diet. States otherwise recent health at baseline. No fever or chills.      Past Medical History  Diagnosis Date  . Hypertension   . Asthma   . Depression   . Bipolar affective disorder, currently depressed, mild   . Stroke   . Coronary artery disease   . Tardive dyskinesia   . Fall 08/25/2012    Recent fall with residual knee and back pain.   Past Surgical History  Procedure Laterality Date  . Abdominal hysterectomy    . Cardiac catheterization     Family History  Problem Relation Age of Onset  . Hypertension Mother    History  Substance Use Topics  . Smoking status: Current Every Day Smoker    Types: Cigarettes    Last Attempt to Quit: 07/13/2012  . Smokeless tobacco: Not on file  . Alcohol Use: Yes     Comment: pt reports once on last Saturday   OB History   Grav Para Term Preterm Abortions TAB SAB Ect Mult Living                 Review of Systems  Constitutional: Negative for fever and chills.  HENT: Negative for sore throat, trouble swallowing and voice change.   Eyes: Negative for redness.  Respiratory: Negative for shortness of breath.   Cardiovascular: Negative for chest pain.   Gastrointestinal: Negative for vomiting, abdominal pain and diarrhea.  Genitourinary: Negative for flank pain.  Musculoskeletal: Negative for back pain and neck pain.  Skin: Negative for rash.  Neurological: Negative for syncope and headaches.  Hematological: Does not bruise/bleed easily.  Psychiatric/Behavioral: Negative for confusion.    Allergies  Clonazepam; Doxycycline; Fosinopril sodium; Hydrochlorothiazide w-triamterene; Penicillins; and Sulfonamide derivatives  Home Medications  No current outpatient prescriptions on file. BP 164/101  Pulse 100  Temp(Src) 97.1 F (36.2 C) (Oral)  Resp 20  SpO2 97% Physical Exam  Nursing note and vitals reviewed. Constitutional: She is oriented to person, place, and time. She appears well-developed and well-nourished. No distress.  HENT:  Mouth/Throat: Oropharynx is clear and moist.  Mild swelling to lips.   Eyes: Conjunctivae are normal. No scleral icterus.  Neck: Neck supple. No tracheal deviation present.  Cardiovascular: Normal rate, regular rhythm, normal heart sounds and intact distal pulses.   Pulmonary/Chest: Effort normal. No respiratory distress.  No stridor, wheezing or increased wob.   Abdominal: Soft. Normal appearance. She exhibits no distension. There is no tenderness.  Musculoskeletal: She exhibits no edema and no tenderness.  Neurological: She is alert and oriented to person, place, and time.  Skin: Skin is warm and dry. No rash noted.  Psychiatric: She has a normal mood and affect.    ED Course  Procedures (including critical care  time)  EKG Interpretation   None       MDM  Pepcid, benadryl. Decadron.   Pt notes feels much improved, requests more medication for itching.  No wheezing. Pharynx normal. Mild swelling to lips.   No increased wob.  Reviewed nursing notes and prior charts for additional history.   Recheck pt improved. Comfortable. Stable for d/c.   Pt states had been on bp meds in past  but out for 'long time', cant recall name. Lists allergy to hctz. Also noted on review prior charts to have hx cocaine abuse w mult pos drug screens.  Will give rx amlodipine, encourage not to use cocaine.   Suzi Roots, MD 08/22/13 [id number]  Suzi Roots, MD 08/22/13 [phone]

## 2013-08-22 NOTE — ED Notes (Signed)
Patient took [facility] powder for headache.  Now with angioedema of lips and face.  Patient is having shortness of breath, patient states tongue is swelling.

## 2013-10-06 ENCOUNTER — Encounter (HOSPITAL_COMMUNITY): Payer: Self-pay | Admitting: Emergency Medicine

## 2013-10-06 ENCOUNTER — Emergency Department (HOSPITAL_COMMUNITY)
Admission: EM | Admit: 2013-10-06 | Discharge: 2013-10-06 | Disposition: A | Discharge status: Home / Self Care | Payer: Medicare Other | Attending: Emergency Medicine | Admitting: Emergency Medicine

## 2013-10-06 DIAGNOSIS — I10 Essential (primary) hypertension: Secondary | ICD-10-CM | POA: Insufficient documentation

## 2013-10-06 DIAGNOSIS — J45909 Unspecified asthma, uncomplicated: Secondary | ICD-10-CM | POA: Insufficient documentation

## 2013-10-06 DIAGNOSIS — I2510 Atherosclerotic heart disease of native coronary artery without angina pectoris: Secondary | ICD-10-CM | POA: Insufficient documentation

## 2013-10-06 DIAGNOSIS — Z8669 Personal history of other diseases of the nervous system and sense organs: Secondary | ICD-10-CM | POA: Insufficient documentation

## 2013-10-06 DIAGNOSIS — Z8673 Personal history of transient ischemic attack (TIA), and cerebral infarction without residual deficits: Secondary | ICD-10-CM | POA: Insufficient documentation

## 2013-10-06 DIAGNOSIS — Z8659 Personal history of other mental and behavioral disorders: Secondary | ICD-10-CM | POA: Insufficient documentation

## 2013-10-06 DIAGNOSIS — F17200 Nicotine dependence, unspecified, uncomplicated: Secondary | ICD-10-CM | POA: Insufficient documentation

## 2013-10-06 DIAGNOSIS — Z880 Allergy status to penicillin: Secondary | ICD-10-CM | POA: Insufficient documentation

## 2013-10-06 DIAGNOSIS — Z95818 Presence of other cardiac implants and grafts: Secondary | ICD-10-CM | POA: Insufficient documentation

## 2013-10-06 DIAGNOSIS — R112 Nausea with vomiting, unspecified: Secondary | ICD-10-CM | POA: Insufficient documentation

## 2013-10-06 LAB — CBC WITH DIFFERENTIAL/PLATELET
Basophils Absolute: 0 10*3/uL (ref 0.0–0.1)
Basophils Relative: 1 % (ref 0–1)
Eosinophils Absolute: 0.2 10*3/uL (ref 0.0–0.7)
Eosinophils Relative: 3 % (ref 0–5)
HCT: 37.4 % (ref 36.0–46.0)
Hemoglobin: 12.9 g/dL (ref 12.0–15.0)
Lymphocytes Relative: 41 % (ref 12–46)
Lymphs Abs: 2.6 10*3/uL (ref 0.7–4.0)
MCH: 31.6 pg (ref 26.0–34.0)
MCHC: 34.5 g/dL (ref 30.0–36.0)
MCV: 91.7 fL (ref 78.0–100.0)
Monocytes Absolute: 0.7 10*3/uL (ref 0.1–1.0)
Monocytes Relative: 10 % (ref 3–12)
Neutro Abs: 3 10*3/uL (ref 1.7–7.7)
Neutrophils Relative %: 46 % (ref 43–77)
Platelets: 252 10*3/uL (ref 150–400)
RBC: 4.08 MIL/uL (ref 3.87–5.11)
RDW: 13.5 % (ref 11.5–15.5)
WBC: 6.5 10*3/uL (ref 4.0–10.5)

## 2013-10-06 LAB — URINALYSIS, ROUTINE W REFLEX MICROSCOPIC
Bilirubin Urine: NEGATIVE
Glucose, UA: NEGATIVE mg/dL
Hgb urine dipstick: NEGATIVE
Ketones, ur: NEGATIVE mg/dL
Leukocytes, UA: NEGATIVE
Nitrite: NEGATIVE
Protein, ur: NEGATIVE mg/dL
Specific Gravity, Urine: 1.01 (ref 1.005–1.030)
Urobilinogen, UA: 0.2 mg/dL (ref 0.0–1.0)
pH: 6 (ref 5.0–8.0)

## 2013-10-06 LAB — COMPREHENSIVE METABOLIC PANEL
ALT: 10 U/L (ref 0–35)
AST: 17 U/L (ref 0–37)
Albumin: 3.3 g/dL — ABNORMAL LOW (ref 3.5–5.2)
Alkaline Phosphatase: 83 U/L (ref 39–117)
BUN: 14 mg/dL (ref 6–23)
CO2: 24 mEq/L (ref 19–32)
Calcium: 8.9 mg/dL (ref 8.4–10.5)
Chloride: 100 mEq/L (ref 96–112)
Creatinine, Ser: 0.89 mg/dL (ref 0.50–1.10)
GFR calc Af Amer: 80 mL/min — ABNORMAL LOW (ref >90)
GFR calc non Af Amer: 69 mL/min — ABNORMAL LOW (ref >90)
Glucose, Bld: 87 mg/dL (ref 70–99)
Potassium: 3.5 mEq/L (ref 3.5–5.1)
Sodium: 136 mEq/L (ref 135–145)
Total Bilirubin: 0.1 mg/dL — ABNORMAL LOW (ref 0.3–1.2)
Total Protein: 7.2 g/dL (ref 6.0–8.3)

## 2013-10-06 LAB — TROPONIN I: Troponin I: <0.3 ng/mL (ref <0.30)

## 2013-10-06 MED ORDER — HYDROCODONE-ACETAMINOPHEN 5-325 MG PO TABS: 1.0000 | ORAL_TABLET | ORAL | Status: DC | PRN

## 2013-10-06 MED ORDER — ONDANSETRON 4 MG PO TBDP
8.0000 mg | ORAL_TABLET | Freq: Once | ORAL | Status: AC
  Administered 2013-10-06: 8 mg via ORAL
  Filled 2013-10-06: qty 2

## 2013-10-06 MED ORDER — ONDANSETRON HCL 4 MG PO TABS: 4.0000 mg | ORAL_TABLET | Freq: Four times a day (QID) | ORAL | Status: DC

## 2013-10-06 NOTE — ED Notes (Signed)
Pt reports she has not been taking blood pressure medications due to not being able to afford them.

## 2013-10-06 NOTE — ED Notes (Addendum)
Pt c/o n/v x 1 wk. Pt states she has been unable to keep anything down. Pt also reports rt arm pain described as dull for about a week as well. Pt has hx of HTN, and Irregular HR.  Pt received 4mg  Zofran en route.

## 2013-10-06 NOTE — ED Provider Notes (Signed)
CSN: [id number]     Arrival date & time 10/06/13  1323 History   First MD Initiated Contact with Patient 10/06/13 1350     Chief Complaint  Patient presents with  . Nausea  . Emesis   (Consider location/radiation/quality/duration/timing/severity/associated sxs/prior Treatment) Patient is a 62 y.o. female presenting with vomiting. The history is provided by the patient. No language interpreter was used.  Emesis Severity:  Moderate Duration:  1 week Number of daily episodes:  "every time I eat or drink anything". Quality:  Stomach contents Associated symptoms: no chills and no diarrhea   Associated symptoms comment:  Nausea and vomiting for the past week with any PO intake. No known fever. She denies abdominal pain, but reports right arm discomfort that is fairly constant. No chest pain or shortness of breath. No diarrhea.    Past Medical History  Diagnosis Date  . Hypertension   . Asthma   . Depression   . Bipolar affective disorder, currently depressed, mild   . Stroke   . Coronary artery disease   . Tardive dyskinesia   . Fall 08/25/2012    Recent fall with residual knee and back pain.   Past Surgical History  Procedure Laterality Date  . Abdominal hysterectomy    . Cardiac catheterization     Family History  Problem Relation Age of Onset  . Hypertension Mother    History  Substance Use Topics  . Smoking status: Current Every Day Smoker    Types: Cigarettes    Last Attempt to Quit: 07/13/2012  . Smokeless tobacco: Not on file  . Alcohol Use: Yes     Comment: pt reports once on last Saturday   OB History   Grav Para Term Preterm Abortions TAB SAB Ect Mult Living                 Review of Systems  Constitutional: Negative for fever and chills.  Respiratory: Negative.  Negative for shortness of breath.   Cardiovascular: Negative.  Negative for chest pain.  Gastrointestinal: Positive for nausea and vomiting. Negative for diarrhea.  Genitourinary: Negative.   Negative for dysuria.  Musculoskeletal:       See HPI.  Skin: Negative.  Negative for rash.  Neurological: Negative.     Allergies  Clonazepam; Doxycycline; Fosinopril sodium; Hydrochlorothiazide w-triamterene; Penicillins; and Sulfonamide derivatives  Home Medications  No current outpatient prescriptions on file. BP 140/82  Pulse 86  Temp(Src) 98.3 F (36.8 C) (Oral)  Resp 23  SpO2 98% Physical Exam  Constitutional: She is oriented to person, place, and time. She appears well-developed and well-nourished.  HENT:  Head: Normocephalic.  Mouth/Throat: Oropharynx is clear and moist.  Neck: Normal range of motion. Neck supple.  Cardiovascular: Normal rate and regular rhythm.   Pulmonary/Chest: Effort normal and breath sounds normal. She exhibits no tenderness.  Abdominal: Soft. Bowel sounds are normal. There is no tenderness. There is no rebound and no guarding.  Musculoskeletal: Normal range of motion. She exhibits no edema.  Neurological: She is alert and oriented to person, place, and time.  Steady tremor c/w h/o tardive dyskinesia.  Skin: Skin is warm and dry. No rash noted.  Psychiatric: She has a normal mood and affect.    ED Course  Procedures (including critical care time) Labs Review Labs Reviewed  CBC WITH DIFFERENTIAL  COMPREHENSIVE METABOLIC PANEL  URINALYSIS, ROUTINE W REFLEX MICROSCOPIC  TROPONIN I   Results for orders placed during the hospital encounter of 10/06/13  COMPREHENSIVE METABOLIC  PANEL      Result Value Range   Sodium 136  135 - 145 mEq/L   Potassium 3.5  3.5 - 5.1 mEq/L   Chloride 100  96 - 112 mEq/L   CO2 24  19 - 32 mEq/L   Glucose, Bld 87  70 - 99 mg/dL   BUN 14  6 - 23 mg/dL   Creatinine, Ser 1.61  0.50 - 1.10 mg/dL   Calcium 8.9  8.4 - 09.6 mg/dL   Total Protein 7.2  6.0 - 8.3 g/dL   Albumin 3.3 (*) 3.5 - 5.2 g/dL   AST 17  0 - 37 U/L   ALT 10  0 - 35 U/L   Alkaline Phosphatase 83  39 - 117 U/L   Total Bilirubin 0.1 (*) 0.3 - 1.2  mg/dL   GFR calc non Af Amer 69 (*) >90 mL/min   GFR calc Af Amer 80 (*) >90 mL/min  CBC WITH DIFFERENTIAL      Result Value Range   WBC 6.5  4.0 - 10.5 K/uL   RBC 4.08  3.87 - 5.11 MIL/uL   Hemoglobin 12.9  12.0 - 15.0 g/dL   HCT 04.5  40.9 - 81.1 %   MCV 91.7  78.0 - 100.0 fL   MCH 31.6  26.0 - 34.0 pg   MCHC 34.5  30.0 - 36.0 g/dL   RDW 91.4  78.2 - 95.6 %   Platelets 252  150 - 400 K/uL   Neutrophils Relative % 46  43 - 77 %   Neutro Abs 3.0  1.7 - 7.7 K/uL   Lymphocytes Relative 41  12 - 46 %   Lymphs Abs 2.6  0.7 - 4.0 K/uL   Monocytes Relative 10  3 - 12 %   Monocytes Absolute 0.7  0.1 - 1.0 K/uL   Eosinophils Relative 3  0 - 5 %   Eosinophils Absolute 0.2  0.0 - 0.7 K/uL   Basophils Relative 1  0 - 1 %   Basophils Absolute 0.0  0.0 - 0.1 K/uL  URINALYSIS, ROUTINE W REFLEX MICROSCOPIC      Result Value Range   Color, Urine YELLOW  YELLOW   APPearance CLEAR  CLEAR   Specific Gravity, Urine 1.010  1.005 - 1.030   pH 6.0  5.0 - 8.0   Glucose, UA NEGATIVE  NEGATIVE mg/dL   Hgb urine dipstick NEGATIVE  NEGATIVE   Bilirubin Urine NEGATIVE  NEGATIVE   Ketones, ur NEGATIVE  NEGATIVE mg/dL   Protein, ur NEGATIVE  NEGATIVE mg/dL   Urobilinogen, UA 0.2  0.0 - 1.0 mg/dL   Nitrite NEGATIVE  NEGATIVE   Leukocytes, UA NEGATIVE  NEGATIVE  TROPONIN I      Result Value Range   Troponin I <0.30  <0.30 ng/mL   No results found.  Imaging Review No results found.  EKG Interpretation   None       MDM  No diagnosis found. 1. N, V  Tolerating PO fluids and solids. Appears well, comfortable. No vomiting in ED.    Arnoldo Hooker, PA-C 10/07/13 [phone]

## 2013-10-07 NOTE — ED Provider Notes (Signed)
Medical screening examination/treatment/procedure(s) were conducted as a shared visit with non-physician practitioner(s) and myself.  I personally evaluated the patient during the encounter.  EKG Interpretation    Date/Time:  Saturday October 06 2013 14:46:36 EST Ventricular Rate:  91 PR Interval:  157 QRS Duration: 88 QT Interval:  394 QTC Calculation: 485 R Axis:   65 Text Interpretation:  Sinus rhythm Borderline T abnormalities, inferior leads No significant change since last tracing Confirmed by Dao Mearns  MD, Bilbo Carcamo ([id number]) on 10/06/2013 3:04:30 PM            Pt c/o nv, few episodes today, not bloody or bilious. No abd distension. Having bms. abd soft nt.   Suzi Roots, MD 10/07/13 [phone]

## 2013-12-08 ENCOUNTER — Emergency Department (HOSPITAL_COMMUNITY): Payer: Medicare Other

## 2013-12-08 ENCOUNTER — Encounter (HOSPITAL_COMMUNITY): Payer: Self-pay | Admitting: Emergency Medicine

## 2013-12-08 ENCOUNTER — Observation Stay (HOSPITAL_COMMUNITY)
Admission: EM | Admit: 2013-12-08 | Discharge: 2013-12-12 | Disposition: A | Discharge status: Home / Self Care | Payer: Medicare Other | Attending: Internal Medicine | Admitting: Internal Medicine

## 2013-12-08 DIAGNOSIS — G43909 Migraine, unspecified, not intractable, without status migrainosus: Secondary | ICD-10-CM

## 2013-12-08 DIAGNOSIS — Z882 Allergy status to sulfonamides status: Secondary | ICD-10-CM | POA: Insufficient documentation

## 2013-12-08 DIAGNOSIS — F3289 Other specified depressive episodes: Secondary | ICD-10-CM

## 2013-12-08 DIAGNOSIS — F1910 Other psychoactive substance abuse, uncomplicated: Secondary | ICD-10-CM

## 2013-12-08 DIAGNOSIS — J45909 Unspecified asthma, uncomplicated: Secondary | ICD-10-CM

## 2013-12-08 DIAGNOSIS — J309 Allergic rhinitis, unspecified: Secondary | ICD-10-CM

## 2013-12-08 DIAGNOSIS — F1410 Cocaine abuse, uncomplicated: Secondary | ICD-10-CM

## 2013-12-08 DIAGNOSIS — Z9119 Patient's noncompliance with other medical treatment and regimen: Secondary | ICD-10-CM

## 2013-12-08 DIAGNOSIS — F419 Anxiety disorder, unspecified: Secondary | ICD-10-CM

## 2013-12-08 DIAGNOSIS — J069 Acute upper respiratory infection, unspecified: Secondary | ICD-10-CM

## 2013-12-08 DIAGNOSIS — Z881 Allergy status to other antibiotic agents status: Secondary | ICD-10-CM | POA: Insufficient documentation

## 2013-12-08 DIAGNOSIS — N760 Acute vaginitis: Secondary | ICD-10-CM

## 2013-12-08 DIAGNOSIS — R410 Disorientation, unspecified: Secondary | ICD-10-CM

## 2013-12-08 DIAGNOSIS — K219 Gastro-esophageal reflux disease without esophagitis: Secondary | ICD-10-CM

## 2013-12-08 DIAGNOSIS — Z880 Allergy status to penicillin: Secondary | ICD-10-CM | POA: Insufficient documentation

## 2013-12-08 DIAGNOSIS — I10 Essential (primary) hypertension: Secondary | ICD-10-CM

## 2013-12-08 DIAGNOSIS — Z8672 Personal history of thrombophlebitis: Secondary | ICD-10-CM

## 2013-12-08 DIAGNOSIS — R4182 Altered mental status, unspecified: Principal | ICD-10-CM | POA: Insufficient documentation

## 2013-12-08 DIAGNOSIS — F200 Paranoid schizophrenia: Secondary | ICD-10-CM | POA: Insufficient documentation

## 2013-12-08 DIAGNOSIS — I2510 Atherosclerotic heart disease of native coronary artery without angina pectoris: Secondary | ICD-10-CM | POA: Insufficient documentation

## 2013-12-08 DIAGNOSIS — E785 Hyperlipidemia, unspecified: Secondary | ICD-10-CM

## 2013-12-08 DIAGNOSIS — F17200 Nicotine dependence, unspecified, uncomplicated: Secondary | ICD-10-CM | POA: Insufficient documentation

## 2013-12-08 DIAGNOSIS — F1994 Other psychoactive substance use, unspecified with psychoactive substance-induced mood disorder: Secondary | ICD-10-CM | POA: Insufficient documentation

## 2013-12-08 DIAGNOSIS — F1210 Cannabis abuse, uncomplicated: Secondary | ICD-10-CM

## 2013-12-08 DIAGNOSIS — Z9181 History of falling: Secondary | ICD-10-CM | POA: Insufficient documentation

## 2013-12-08 DIAGNOSIS — F329 Major depressive disorder, single episode, unspecified: Secondary | ICD-10-CM

## 2013-12-08 DIAGNOSIS — F319 Bipolar disorder, unspecified: Secondary | ICD-10-CM | POA: Insufficient documentation

## 2013-12-08 DIAGNOSIS — F411 Generalized anxiety disorder: Secondary | ICD-10-CM

## 2013-12-08 DIAGNOSIS — Z91199 Patient's noncompliance with other medical treatment and regimen due to unspecified reason: Secondary | ICD-10-CM

## 2013-12-08 LAB — I-STAT CHEM 8, ED
BUN: 11 mg/dL (ref 6–23)
Calcium, Ion: 1.24 mmol/L (ref 1.13–1.30)
Chloride: 96 mEq/L (ref 96–112)
Creatinine, Ser: 1.3 mg/dL — ABNORMAL HIGH (ref 0.50–1.10)
Glucose, Bld: 121 mg/dL — ABNORMAL HIGH (ref 70–99)
HCT: 44 % (ref 36.0–46.0)
Hemoglobin: 15 g/dL (ref 12.0–15.0)
Potassium: 3.2 mEq/L — ABNORMAL LOW (ref 3.7–5.3)
Sodium: 136 mEq/L — ABNORMAL LOW (ref 137–147)
TCO2: 29 mmol/L (ref 0–100)

## 2013-12-08 LAB — CBC
HCT: 38.9 % (ref 36.0–46.0)
Hemoglobin: 13.5 g/dL (ref 12.0–15.0)
MCH: 32.3 pg (ref 26.0–34.0)
MCHC: 34.7 g/dL (ref 30.0–36.0)
MCV: 93.1 fL (ref 78.0–100.0)
Platelets: 213 10*3/uL (ref 150–400)
RBC: 4.18 MIL/uL (ref 3.87–5.11)
RDW: 13.2 % (ref 11.5–15.5)
WBC: 7.2 10*3/uL (ref 4.0–10.5)

## 2013-12-08 LAB — URINALYSIS, ROUTINE W REFLEX MICROSCOPIC
Bilirubin Urine: NEGATIVE
Glucose, UA: NEGATIVE mg/dL
Hgb urine dipstick: NEGATIVE
Ketones, ur: NEGATIVE mg/dL
Leukocytes, UA: NEGATIVE
Nitrite: NEGATIVE
Protein, ur: NEGATIVE mg/dL
Specific Gravity, Urine: 1.013 (ref 1.005–1.030)
Urobilinogen, UA: 0.2 mg/dL (ref 0.0–1.0)
pH: 6 (ref 5.0–8.0)

## 2013-12-08 LAB — COMPREHENSIVE METABOLIC PANEL
ALT: 13 U/L (ref 0–35)
AST: 22 U/L (ref 0–37)
Albumin: 3.4 g/dL — ABNORMAL LOW (ref 3.5–5.2)
Alkaline Phosphatase: 78 U/L (ref 39–117)
BUN: 11 mg/dL (ref 6–23)
CO2: 25 mEq/L (ref 19–32)
Calcium: 9.3 mg/dL (ref 8.4–10.5)
Chloride: 93 mEq/L — ABNORMAL LOW (ref 96–112)
Creatinine, Ser: 1.07 mg/dL (ref 0.50–1.10)
GFR calc Af Amer: 64 mL/min — ABNORMAL LOW (ref >90)
GFR calc non Af Amer: 55 mL/min — ABNORMAL LOW (ref >90)
Glucose, Bld: 120 mg/dL — ABNORMAL HIGH (ref 70–99)
Potassium: 3.4 mEq/L — ABNORMAL LOW (ref 3.7–5.3)
Sodium: 133 mEq/L — ABNORMAL LOW (ref 137–147)
Total Bilirubin: <0.2 mg/dL — ABNORMAL LOW (ref 0.3–1.2)
Total Protein: 7.5 g/dL (ref 6.0–8.3)

## 2013-12-08 LAB — DIFFERENTIAL
Basophils Absolute: 0 10*3/uL (ref 0.0–0.1)
Basophils Relative: 1 % (ref 0–1)
Eosinophils Absolute: 0.2 10*3/uL (ref 0.0–0.7)
Eosinophils Relative: 3 % (ref 0–5)
Lymphocytes Relative: 64 % — ABNORMAL HIGH (ref 12–46)
Lymphs Abs: 4.6 10*3/uL — ABNORMAL HIGH (ref 0.7–4.0)
Monocytes Absolute: 0.5 10*3/uL (ref 0.1–1.0)
Monocytes Relative: 7 % (ref 3–12)
Neutro Abs: 1.8 10*3/uL (ref 1.7–7.7)
Neutrophils Relative %: 25 % — ABNORMAL LOW (ref 43–77)

## 2013-12-08 LAB — PROTIME-INR
INR: 1 (ref 0.00–1.49)
Prothrombin Time: 13 seconds (ref 11.6–15.2)

## 2013-12-08 LAB — CBG MONITORING, ED: Glucose-Capillary: 97 mg/dL (ref 70–99)

## 2013-12-08 LAB — PREGNANCY, URINE: Preg Test, Ur: NEGATIVE

## 2013-12-08 LAB — I-STAT TROPONIN, ED: Troponin i, poc: 0.01 ng/mL (ref 0.00–0.08)

## 2013-12-08 LAB — APTT: aPTT: 29 seconds (ref 24–37)

## 2013-12-08 MED ORDER — POTASSIUM CHLORIDE CRYS ER 20 MEQ PO TBCR
40.0000 meq | EXTENDED_RELEASE_TABLET | Freq: Once | ORAL | Status: AC
  Administered 2013-12-09: 40 meq via ORAL
  Filled 2013-12-08: qty 2

## 2013-12-08 NOTE — Consult Note (Signed)
Reason for Consult: Acute altered mental status.  HPI:                                                                                                                                          Danielle [name] is an 63 y.o. female history of hypertension, bipolar affective disorder, coronary artery disease, paranoid schizophrenia and stroke, who was brought to the emergency room following acute onset of confusion. She was last known well at 8 PM. Patient was a local store and was noted to be confused by family members and subsequently by EMS personnel. No focal weakness was noted. She had very minimal speech output. She arrived in code stroke status. NIH stroke score was 1 for confusion. CT scan of her head showed no acute intracranial abnormality. Code stroke status was canceled. Laboratory studies showed minimal hyponatremia and mild hypokalemia. BUN was 11 creatinine was 1.3. Urinalysis was unremarkable. Patient was afebrile. Urine drug screen is pending.   Past Medical History  Diagnosis Date  . Hypertension   . Asthma   . Depression   . Bipolar affective disorder, currently depressed, mild   . Stroke   . Coronary artery disease   . Tardive dyskinesia   . Fall 08/25/2012    Recent fall with residual knee and back pain.    Past Surgical History  Procedure Laterality Date  . Abdominal hysterectomy    . Cardiac catheterization      Family History  Problem Relation Age of Onset  . Hypertension Mother     Social History:  reports that she has been smoking Cigarettes.  She has been smoking about 0.00 packs per day. She does not have any smokeless tobacco history on file. She reports that she drinks alcohol. She reports that she uses illicit drugs (Cocaine and Marijuana).  Allergies  Allergen Reactions  . Clonazepam Swelling    Pt was recently given a triamterene hydrochlorothiazide combination as well as clonazepam and had an allergic reaction to one of them. Caused swelling of face  and eyes  . Doxycycline Swelling    Swelling of face and eyes  . Fosinopril Sodium Swelling     swelling face,lips-angioedema from ACE I  . Hydrochlorothiazide W-Triamterene Swelling    Pt was recently given a triamterene hydrochlorothiazide combination as well as clonazepam and had an allergic reaction to one of them. Swelling of face and eyes  . Penicillins Swelling    Face and eyes  . Sulfonamide Derivatives Other (See Comments)    Unknown from childhood    MEDICATIONS:  I have reviewed the patient's current medications.   ROS:                                                                                                                                       History obtained from unobtainable from patient due to mental status   There were no vitals taken for this visit.   Neurologic Examination:                                                                                                      Mental Status: Lethargic, oriented to correct age but not to correct month.  Speech output was somewhat sparse but patient had no signs of expressive aphasia. Attention span was extremely brief. Able to follow commands without difficulty. Cranial Nerves: II-Visual fields were normal. III/IV/VI-Pupils were equal and reacted. Extraocular movements were full and conjugate.    V/VII-no facial numbness and no facial weakness. VIII-normal. X-dysarthric consistent with being edentulous. Motor: 5/5 bilaterally with normal tone and bulk Sensory: Normal throughout. Deep Tendon Reflexes: 1+ and symmetric. Plantars: Mute bilaterally Cerebellar: Normal finger-to-nose testing.  Lab Results  Component Value Date/Time   CHOL  Value: 205        ATP III CLASSIFICATION:  <200     mg/dL   Desirable  161-096  mg/dL   Borderline High  >=045    mg/dL   High       * 01/24/8118  4:04 AM     Results for orders placed during the hospital encounter of 12/08/13 (from the past 48 hour(s))  PROTIME-INR     Status: None   Collection Time    12/08/13  9:39 PM      Result Value Ref Range   Prothrombin Time 13.0  11.6 - 15.2 seconds   INR 1.00  0.00 - 1.49  APTT     Status: None   Collection Time    12/08/13  9:39 PM      Result Value Ref Range   aPTT 29  24 - 37 seconds  CBC     Status: None   Collection Time    12/08/13  9:39 PM      Result Value Ref Range   WBC 7.2  4.0 - 10.5 K/uL   RBC 4.18  3.87 - 5.11 MIL/uL   Hemoglobin 13.5  12.0 - 15.0 g/dL   HCT 14.7  82.9 - 56.2 %   MCV 93.1  78.0 - 100.0 fL  MCH 32.3  26.0 - 34.0 pg   MCHC 34.7  30.0 - 36.0 g/dL   RDW 11.9  14.7 - 82.9 %   Platelets 213  150 - 400 K/uL  DIFFERENTIAL     Status: Abnormal   Collection Time    12/08/13  9:39 PM      Result Value Ref Range   Neutrophils Relative % 25 (*) 43 - 77 %   Neutro Abs 1.8  1.7 - 7.7 K/uL   Lymphocytes Relative 64 (*) 12 - 46 %   Lymphs Abs 4.6 (*) 0.7 - 4.0 K/uL   Monocytes Relative 7  3 - 12 %   Monocytes Absolute 0.5  0.1 - 1.0 K/uL   Eosinophils Relative 3  0 - 5 %   Eosinophils Absolute 0.2  0.0 - 0.7 K/uL   Basophils Relative 1  0 - 1 %   Basophils Absolute 0.0  0.0 - 0.1 K/uL  I-STAT CHEM 8, ED     Status: Abnormal   Collection Time    12/08/13 10:07 PM      Result Value Ref Range   Sodium 136 (*) 137 - 147 mEq/L   Potassium 3.2 (*) 3.7 - 5.3 mEq/L   Chloride 96  96 - 112 mEq/L   BUN 11  6 - 23 mg/dL   Creatinine, Ser 5.62 (*) 0.50 - 1.10 mg/dL   Glucose, Bld 130 (*) 70 - 99 mg/dL   Calcium, Ion 8.65  7.84 - 1.30 mmol/L   TCO2 29  0 - 100 mmol/L   Hemoglobin 15.0  12.0 - 15.0 g/dL   HCT 69.6  29.5 - 28.4 %    Ct Head (brain) Wo Contrast  12/08/2013   CLINICAL DATA:  Code stroke.  Altered mental status.  EXAM: CT HEAD WITHOUT CONTRAST  TECHNIQUE: Contiguous axial images were obtained from the base of the skull through the vertex without  intravenous contrast.  COMPARISON:  CT of the head performed 03/07/2009  FINDINGS: There is no evidence of acute infarction, mass lesion, or intra- or extra-axial hemorrhage on CT.  Diffuse periventricular and subcortical white matter change is thought to reflect small vessel ischemic microangiopathy; this is mildly worsened at the left frontal lobe, without definite evidence of an acute infarct.  The posterior fossa, including the cerebellum, brainstem and fourth ventricle, is within normal limits. The third and lateral ventricles, and basal ganglia are unremarkable in appearance. The cerebral hemispheres are symmetric in appearance, with normal gray-white differentiation. No mass effect or midline shift is seen.  There is no evidence of fracture; visualized osseous structures are unremarkable in appearance. The orbits are within normal limits. The paranasal sinuses and mastoid air cells are well-aerated. No significant soft tissue abnormalities are seen.  IMPRESSION: 1. No acute intracranial pathology seen on CT. 2. Diffuse small vessel ischemic microangiopathy noted; this is mildly worsened at the left frontal lobe in comparison to 2010, without definite evidence of an acute infarct.  These results were called by telephone at the time of interpretation on 12/08/2013 at 9:55 PM to Dr. Roseanne Reno, who verbally acknowledged these results.   Electronically Signed   By: Roanna Raider M.D.   On: 12/08/2013 21:56   Assessment/Plan: Altered mental status with probable acute delirium of unclear etiology.patient had no signs of acute stroke including no signs of expressive aphasia, nor focal weakness. Urinalysis was unremarkable. Urine drug screen is pending.  Recommendations:  1. No further neurodiagnostic studies are indicated acutely. 2.  Consider MRI and EEG if delirium continues 3. Consider psychiatry consultation if no indication for acute mental status change is determined.  We will continue to follow this  patient with you.   C.R. Roseanne Reno, MD [facility] [phone]  12/08/2013, 10:12 PM

## 2013-12-08 NOTE — ED Provider Notes (Addendum)
CSN: [id number]     Arrival date & time 12/08/13  2137 History   First MD Initiated Contact with Patient 12/08/13 2151     Chief Complaint  Patient presents with  . Altered Mental Status    An emergency department physician performed an initial assessment on this suspected stroke patient at 2137. (Consider location/radiation/quality/duration/timing/severity/associated sxs/prior Treatment) HPI L5 caveat altered mental status. Seen on arrival his is taken from patient's daughter and from EMS. Daughter drove patient into a store apparently one hour prior to coming here. She did not come out of the store after a prolonged period and daughter went to the store to find her acutely confused. Patient did not recognize her home. EMS was called. Code stroke in the [place]. No treatment prior to coming here. Past Medical History  Diagnosis Date  . Hypertension   . Asthma   . Depression   . Bipolar affective disorder, currently depressed, mild   . Stroke   . Coronary artery disease   . Tardive dyskinesia   . Fall 08/25/2012    Recent fall with residual knee and back pain.   Past Surgical History  Procedure Laterality Date  . Abdominal hysterectomy    . Cardiac catheterization     Family History  Problem Relation Age of Onset  . Hypertension Mother    History  Substance Use Topics  . Smoking status: Current Every Day Smoker    Types: Cigarettes    Last Attempt to Quit: 07/13/2012  . Smokeless tobacco: Not on file  . Alcohol Use: Yes     Comment: pt reports once on last Saturday   OB History   Grav Para Term Preterm Abortions TAB SAB Ect Mult Living                 Review of Systems  Unable to perform ROS: Mental status change      Allergies  Clonazepam; Doxycycline; Fosinopril sodium; Hydrochlorothiazide w-triamterene; Penicillins; and Sulfonamide derivatives  Home Medications   Current Outpatient Rx  Name  Route  Sig  Dispense  Refill  . ondansetron (ZOFRAN) 4 MG  tablet   Oral   Take 1 tablet (4 mg total) by mouth every 6 (six) hours.   12 tablet   0    BP 139/95  Temp(Src) 98.3 F (36.8 C) (Oral)  Resp 23  SpO2 99% Physical Exam  Nursing note and vitals reviewed. Constitutional: She appears well-developed and well-nourished.  HENT:  Head: Normocephalic and atraumatic.  Mucous membranes dry  Eyes: Conjunctivae are normal. Pupils are equal, round, and reactive to light.  Neck: Neck supple. No tracheal deviation present. No thyromegaly present.  Cardiovascular: Normal rate and regular rhythm.   No murmur heard. Pulmonary/Chest: Effort normal and breath sounds normal.  Abdominal: Soft. Bowel sounds are normal. She exhibits no distension. There is no tenderness.  Musculoskeletal: Normal range of motion. She exhibits no edema and no tenderness.  Neurological: She is alert. No cranial nerve deficit. Coordination normal.  Oriented to name this month. Follow simple commands, moves all extremities motor is 5 over 5 overall  Skin: Skin is warm and dry. No rash noted.  Psychiatric: She has a normal mood and affect.    ED Course  Procedures (including critical care time) Labs Review Labs Reviewed  DIFFERENTIAL - Abnormal; Notable for the following:    Neutrophils Relative % 25 (*)    Lymphocytes Relative 64 (*)    Lymphs Abs 4.6 (*)  All other components within normal limits  COMPREHENSIVE METABOLIC PANEL - Abnormal; Notable for the following:    Sodium 133 (*)    Potassium 3.4 (*)    Chloride 93 (*)    Glucose, Bld 120 (*)    Albumin 3.4 (*)    Total Bilirubin <0.2 (*)    GFR calc non Af Amer 55 (*)    GFR calc Af Amer 64 (*)    All other components within normal limits  I-STAT CHEM 8, ED - Abnormal; Notable for the following:    Sodium 136 (*)    Potassium 3.2 (*)    Creatinine, Ser 1.30 (*)    Glucose, Bld 121 (*)    All other components within normal limits  PROTIME-INR  APTT  CBC  PREGNANCY, URINE  CBG MONITORING, ED   I-STAT TROPOININ, ED   Imaging Review Ct Head (brain) Wo Contrast  12/08/2013   CLINICAL DATA:  Code stroke.  Altered mental status.  EXAM: CT HEAD WITHOUT CONTRAST  TECHNIQUE: Contiguous axial images were obtained from the base of the skull through the vertex without intravenous contrast.  COMPARISON:  CT of the head performed 03/07/2009  FINDINGS: There is no evidence of acute infarction, mass lesion, or intra- or extra-axial hemorrhage on CT.  Diffuse periventricular and subcortical white matter change is thought to reflect small vessel ischemic microangiopathy; this is mildly worsened at the left frontal lobe, without definite evidence of an acute infarct.  The posterior fossa, including the cerebellum, brainstem and fourth ventricle, is within normal limits. The third and lateral ventricles, and basal ganglia are unremarkable in appearance. The cerebral hemispheres are symmetric in appearance, with normal gray-white differentiation. No mass effect or midline shift is seen.  There is no evidence of fracture; visualized osseous structures are unremarkable in appearance. The orbits are within normal limits. The paranasal sinuses and mastoid air cells are well-aerated. No significant soft tissue abnormalities are seen.  IMPRESSION: 1. No acute intracranial pathology seen on CT. 2. Diffuse small vessel ischemic microangiopathy noted; this is mildly worsened at the left frontal lobe in comparison to 2010, without definite evidence of an acute infarct.  These results were called by telephone at the time of interpretation on 12/08/2013 at 9:55 PM to Dr. Roseanne RenoStewart, who verbally acknowledged these results.   Electronically Signed   By: Roanna RaiderJeffery  Chang M.D.   On: 12/08/2013 21:56    EKG Interpretation    Date/Time:  Saturday December 08 2013 21:53:52 EST Ventricular Rate:  85 PR Interval:  137 QRS Duration: 90 QT Interval:  451 QTC Calculation: 536 R Axis:   68 Text Interpretation:  Sinus rhythm Ventricular  bigeminy Probable left atrial enlargement Nonspecific T abnormalities, lateral leads Premature ventricular complexes New since previous tracing Confirmed by Ethelda ChickJACUBOWITZ  MD, Keimon Basaldua ([id number]) on 12/08/2013 10:54:53 PM           Results for orders placed during the hospital encounter of 12/08/13  PROTIME-INR      Result Value Ref Range   Prothrombin Time 13.0  11.6 - 15.2 seconds   INR 1.00  0.00 - 1.49  APTT      Result Value Ref Range   aPTT 29  24 - 37 seconds  CBC      Result Value Ref Range   WBC 7.2  4.0 - 10.5 K/uL   RBC 4.18  3.87 - 5.11 MIL/uL   Hemoglobin 13.5  12.0 - 15.0 g/dL   HCT 47.838.9  29.536.0 -  46.0 %   MCV 93.1  78.0 - 100.0 fL   MCH 32.3  26.0 - 34.0 pg   MCHC 34.7  30.0 - 36.0 g/dL   RDW 16.1  09.6 - 04.5 %   Platelets 213  150 - 400 K/uL  DIFFERENTIAL      Result Value Ref Range   Neutrophils Relative % 25 (*) 43 - 77 %   Neutro Abs 1.8  1.7 - 7.7 K/uL   Lymphocytes Relative 64 (*) 12 - 46 %   Lymphs Abs 4.6 (*) 0.7 - 4.0 K/uL   Monocytes Relative 7  3 - 12 %   Monocytes Absolute 0.5  0.1 - 1.0 K/uL   Eosinophils Relative 3  0 - 5 %   Eosinophils Absolute 0.2  0.0 - 0.7 K/uL   Basophils Relative 1  0 - 1 %   Basophils Absolute 0.0  0.0 - 0.1 K/uL  COMPREHENSIVE METABOLIC PANEL      Result Value Ref Range   Sodium 133 (*) 137 - 147 mEq/L   Potassium 3.4 (*) 3.7 - 5.3 mEq/L   Chloride 93 (*) 96 - 112 mEq/L   CO2 25  19 - 32 mEq/L   Glucose, Bld 120 (*) 70 - 99 mg/dL   BUN 11  6 - 23 mg/dL   Creatinine, Ser 4.09  0.50 - 1.10 mg/dL   Calcium 9.3  8.4 - 81.1 mg/dL   Total Protein 7.5  6.0 - 8.3 g/dL   Albumin 3.4 (*) 3.5 - 5.2 g/dL   AST 22  0 - 37 U/L   ALT 13  0 - 35 U/L   Alkaline Phosphatase 78  39 - 117 U/L   Total Bilirubin <0.2 (*) 0.3 - 1.2 mg/dL   GFR calc non Af Amer 55 (*) >90 mL/min   GFR calc Af Amer 64 (*) >90 mL/min  PREGNANCY, URINE      Result Value Ref Range   Preg Test, Ur NEGATIVE  NEGATIVE  URINE RAPID DRUG SCREEN (HOSP PERFORMED)       Result Value Ref Range   Opiates NONE DETECTED  NONE DETECTED   Cocaine NONE DETECTED  NONE DETECTED   Benzodiazepines NONE DETECTED  NONE DETECTED   Amphetamines NONE DETECTED  NONE DETECTED   Tetrahydrocannabinol NONE DETECTED  NONE DETECTED   Barbiturates NONE DETECTED  NONE DETECTED  URINALYSIS, ROUTINE W REFLEX MICROSCOPIC      Result Value Ref Range   Color, Urine YELLOW  YELLOW   APPearance CLEAR  CLEAR   Specific Gravity, Urine 1.013  1.005 - 1.030   pH 6.0  5.0 - 8.0   Glucose, UA NEGATIVE  NEGATIVE mg/dL   Hgb urine dipstick NEGATIVE  NEGATIVE   Bilirubin Urine NEGATIVE  NEGATIVE   Ketones, ur NEGATIVE  NEGATIVE mg/dL   Protein, ur NEGATIVE  NEGATIVE mg/dL   Urobilinogen, UA 0.2  0.0 - 1.0 mg/dL   Nitrite NEGATIVE  NEGATIVE   Leukocytes, UA NEGATIVE  NEGATIVE  CBG MONITORING, ED      Result Value Ref Range   Glucose-Capillary 97  70 - 99 mg/dL  I-STAT TROPOININ, ED      Result Value Ref Range   Troponin i, poc 0.01  0.00 - 0.08 ng/mL   Comment 3           I-STAT CHEM 8, ED      Result Value Ref Range   Sodium 136 (*) 137 - 147 mEq/L  Potassium 3.2 (*) 3.7 - 5.3 mEq/L   Chloride 96  96 - 112 mEq/L   BUN 11  6 - 23 mg/dL   Creatinine, Ser 4.09 (*) 0.50 - 1.10 mg/dL   Glucose, Bld 811 (*) 70 - 99 mg/dL   Calcium, Ion 9.14  7.82 - 1.30 mmol/L   TCO2 29  0 - 100 mmol/L   Hemoglobin 15.0  12.0 - 15.0 g/dL   HCT 95.6  21.3 - 08.6 %   Ct Head (brain) Wo Contrast  12/08/2013   CLINICAL DATA:  Code stroke.  Altered mental status.  EXAM: CT HEAD WITHOUT CONTRAST  TECHNIQUE: Contiguous axial images were obtained from the base of the skull through the vertex without intravenous contrast.  COMPARISON:  CT of the head performed 03/07/2009  FINDINGS: There is no evidence of acute infarction, mass lesion, or intra- or extra-axial hemorrhage on CT.  Diffuse periventricular and subcortical white matter change is thought to reflect small vessel ischemic microangiopathy; this is  mildly worsened at the left frontal lobe, without definite evidence of an acute infarct.  The posterior fossa, including the cerebellum, brainstem and fourth ventricle, is within normal limits. The third and lateral ventricles, and basal ganglia are unremarkable in appearance. The cerebral hemispheres are symmetric in appearance, with normal gray-white differentiation. No mass effect or midline shift is seen.  There is no evidence of fracture; visualized osseous structures are unremarkable in appearance. The orbits are within normal limits. The paranasal sinuses and mastoid air cells are well-aerated. No significant soft tissue abnormalities are seen.  IMPRESSION: 1. No acute intracranial pathology seen on CT. 2. Diffuse small vessel ischemic microangiopathy noted; this is mildly worsened at the left frontal lobe in comparison to 2010, without definite evidence of an acute infarct.  These results were called by telephone at the time of interpretation on 12/08/2013 at 9:55 PM to Dr. Roseanne Reno, who verbally acknowledged these results.   Electronically Signed   By: Roanna Raider M.D.   On: 12/08/2013 21:56   Dg Chest Portable 1 View  12/09/2013   CLINICAL DATA:  Altered mental status.  EXAM: PORTABLE CHEST - 1 VIEW  COMPARISON:  DG CHEST 2 VIEW dated 04/05/2012  FINDINGS: Cardiomediastinal silhouette is unremarkable. Diffuse interstitial prominence, with central pulmonary vasculature congestion, increased. Strandy densities in lung bases. No pleural effusions. No pneumothorax.  Multiple EKG lines overlie the patient and may obscure subtle underlying pathology. Punctate calcification in the left neck may be vascular. Multiple EKG lines overlie the patient and may obscure subtle underlying pathology.  IMPRESSION: Increasing interstitial prominence, which could reflect pulmonary edema. Bibasilar atelectasis.   Electronically Signed   By: Awilda Metro   On: 12/09/2013 00:13   Chest xray viewed by me  MDM   Final  diagnoses:  Altered mental status  Essential hypertension, benign   evaluated by neurologist Dr. Roseanne Reno. Code stroke canceled. Spoke with Dr. Julian Reil who will evaluate patient for admission Diagnoses #1 acute delirium #2 hypokalemia     Doug Sou, MD 12/08/13 5784  Doug Sou, MD 12/09/13 6962  Doug Sou, MD 12/09/13 9528

## 2013-12-08 NOTE — ED Notes (Signed)
Per EMS pt walked into store and daughter states pt never came back out. Daughter found pt in store playing with the wall and altered mental status. Daughter states last seen normal at 8pm

## 2013-12-09 ENCOUNTER — Observation Stay (HOSPITAL_COMMUNITY): Payer: Medicare Other

## 2013-12-09 DIAGNOSIS — R404 Transient alteration of awareness: Secondary | ICD-10-CM

## 2013-12-09 LAB — RAPID URINE DRUG SCREEN, HOSP PERFORMED
Amphetamines: NOT DETECTED
Barbiturates: NOT DETECTED
Benzodiazepines: NOT DETECTED
Cocaine: NOT DETECTED
Opiates: NOT DETECTED
Tetrahydrocannabinol: NOT DETECTED

## 2013-12-09 LAB — COMPREHENSIVE METABOLIC PANEL
ALT: 12 U/L (ref 0–35)
AST: 19 U/L (ref 0–37)
Albumin: 3.3 g/dL — ABNORMAL LOW (ref 3.5–5.2)
Alkaline Phosphatase: 73 U/L (ref 39–117)
BUN: 11 mg/dL (ref 6–23)
CO2: 28 mEq/L (ref 19–32)
Calcium: 9.4 mg/dL (ref 8.4–10.5)
Chloride: 98 mEq/L (ref 96–112)
Creatinine, Ser: 0.98 mg/dL (ref 0.50–1.10)
GFR calc Af Amer: 71 mL/min — ABNORMAL LOW (ref >90)
GFR calc non Af Amer: 61 mL/min — ABNORMAL LOW (ref >90)
Glucose, Bld: 93 mg/dL (ref 70–99)
Potassium: 3.5 mEq/L — ABNORMAL LOW (ref 3.7–5.3)
Sodium: 137 mEq/L (ref 137–147)
Total Bilirubin: <0.2 mg/dL — ABNORMAL LOW (ref 0.3–1.2)
Total Protein: 7.1 g/dL (ref 6.0–8.3)

## 2013-12-09 LAB — CBC
HCT: 38.4 % (ref 36.0–46.0)
Hemoglobin: 13.3 g/dL (ref 12.0–15.0)
MCH: 32.4 pg (ref 26.0–34.0)
MCHC: 34.6 g/dL (ref 30.0–36.0)
MCV: 93.4 fL (ref 78.0–100.0)
Platelets: 207 10*3/uL (ref 150–400)
RBC: 4.11 MIL/uL (ref 3.87–5.11)
RDW: 13.4 % (ref 11.5–15.5)
WBC: 6.1 10*3/uL (ref 4.0–10.5)

## 2013-12-09 LAB — ETHANOL: Alcohol, Ethyl (B): <11 mg/dL (ref 0–11)

## 2013-12-09 MED ORDER — CITALOPRAM HYDROBROMIDE 40 MG PO TABS
40.0000 mg | ORAL_TABLET | Freq: Every day | ORAL | Status: DC
  Administered 2013-12-09 – 2013-12-12 (×4): 40 mg via ORAL
  Filled 2013-12-09 (×4): qty 1

## 2013-12-09 MED ORDER — GABAPENTIN 300 MG PO CAPS
300.0000 mg | ORAL_CAPSULE | Freq: Three times a day (TID) | ORAL | Status: DC
  Administered 2013-12-09 – 2013-12-12 (×9): 300 mg via ORAL
  Filled 2013-12-09 (×12): qty 1

## 2013-12-09 MED ORDER — IBUPROFEN 600 MG PO TABS
600.0000 mg | ORAL_TABLET | Freq: Four times a day (QID) | ORAL | Status: DC | PRN
  Administered 2013-12-09: 600 mg via ORAL
  Filled 2013-12-09: qty 1

## 2013-12-09 MED ORDER — ALPRAZOLAM 0.5 MG PO TABS
1.0000 mg | ORAL_TABLET | Freq: Three times a day (TID) | ORAL | Status: DC
  Administered 2013-12-09 – 2013-12-10 (×3): 1 mg via ORAL
  Filled 2013-12-09 (×3): qty 2

## 2013-12-09 MED ORDER — RISPERIDONE 1 MG PO TABS
1.0000 mg | ORAL_TABLET | Freq: Two times a day (BID) | ORAL | Status: DC
  Administered 2013-12-09 – 2013-12-12 (×7): 1 mg via ORAL
  Filled 2013-12-09 (×8): qty 1

## 2013-12-09 MED ORDER — HEPARIN SODIUM (PORCINE) 5000 UNIT/ML IJ SOLN
5000.0000 [IU] | Freq: Three times a day (TID) | INTRAMUSCULAR | Status: DC
  Administered 2013-12-09 – 2013-12-12 (×9): 5000 [IU] via SUBCUTANEOUS
  Filled 2013-12-09 (×14): qty 1

## 2013-12-09 MED ORDER — BENZTROPINE MESYLATE 1 MG PO TABS
1.0000 mg | ORAL_TABLET | Freq: Two times a day (BID) | ORAL | Status: DC
  Administered 2013-12-09 – 2013-12-12 (×6): 1 mg via ORAL
  Filled 2013-12-09 (×8): qty 1

## 2013-12-09 MED ORDER — TRAZODONE HCL 100 MG PO TABS
200.0000 mg | ORAL_TABLET | Freq: Every day | ORAL | Status: DC
  Administered 2013-12-09 – 2013-12-11 (×3): 200 mg via ORAL
  Filled 2013-12-09 (×4): qty 2

## 2013-12-09 MED ORDER — ONDANSETRON HCL 4 MG PO TABS
4.0000 mg | ORAL_TABLET | Freq: Four times a day (QID) | ORAL | Status: DC
  Administered 2013-12-09 – 2013-12-12 (×10): 4 mg via ORAL
  Filled 2013-12-09 (×19): qty 1

## 2013-12-09 NOTE — Progress Notes (Signed)
Patient is now complaining of headache, 8/10 intensity, no meningismus, no SIRS criteria at all at this time.  Ibuprofen and continue to observe for now.

## 2013-12-09 NOTE — H&P (Addendum)
[facility] History and Physical  Danielle [name] ZOX:[id number] DOB: 1951-01-01 DOA: 12/08/2013  Referring physician: EDP PCP: No PCP Per Patient   Chief Complaint: AMS   HPI: Danielle [name] is a 63 y.o. female who presents to the ED with abrupt onset of confusion this afternoon per daughter.  Daughter drove patient to the store about 1 hour PTA.  Patient went in to the store, but didn't come out after a prolonged period of time which prompted daughter to go in after her.  Daughter went into the store to find patient acutely confused, and playing with the wall.  While patient does have a history of paranoid schizophrenia in the past she has never had confusion like this previously.  EMS was called.  Patient was brought in as a code stroke, but this was canceled after no focal neurologic deficits found on exam by neurology after arrival.  Review of Systems: Patient denies dysuria, fever, chills, cough, SOB, n/v/d, pain anywhere, Systems reviewed.  As above, otherwise negative  Past Medical History  Diagnosis Date  . Hypertension   . Asthma   . Depression   . Bipolar affective disorder, currently depressed, mild   . Stroke   . Coronary artery disease   . Tardive dyskinesia   . Fall 08/25/2012    Recent fall with residual knee and back pain.   Past Surgical History  Procedure Laterality Date  . Abdominal hysterectomy    . Cardiac catheterization     Social History:  reports that she has been smoking Cigarettes.  She has been smoking about 0.00 packs per day. She does not have any smokeless tobacco history on file. She reports that she drinks alcohol. She reports that she uses illicit drugs (Cocaine and Marijuana).  Allergies  Allergen Reactions  . Clonazepam Swelling    Pt was recently given a triamterene hydrochlorothiazide combination as well as clonazepam and had an allergic reaction to one of them. Caused swelling of face and eyes  . Doxycycline Swelling    Swelling  of face and eyes  . Fosinopril Sodium Swelling     swelling face,lips-angioedema from ACE I  . Hydrochlorothiazide W-Triamterene Swelling    Pt was recently given a triamterene hydrochlorothiazide combination as well as clonazepam and had an allergic reaction to one of them. Swelling of face and eyes  . Penicillins Swelling    Face and eyes  . Sulfonamide Derivatives Other (See Comments)    Unknown from childhood    Family History  Problem Relation Age of Onset  . Hypertension Mother      Prior to Admission medications   Medication Sig Start Date End Date Taking? Authorizing Provider  ondansetron (ZOFRAN) 4 MG tablet Take 1 tablet (4 mg total) by mouth every 6 (six) hours. 10/06/13   Arnoldo HookerShari A Upstill, PA-C   Physical Exam: Filed Vitals:   12/09/13 0008  BP:   Temp: 97.6 F (36.4 C)  Resp:     BP 139/95  Temp(Src) 97.6 F (36.4 C) (Oral)  Resp 23  SpO2 99%  General Appearance:    Alert, obeys commands, but is clearly confused, knows she is in the hospital, states she does not know why she is here, does not know where she was before coming here, no distress, appears stated age  Head:    Normocephalic, atraumatic  Eyes:    PERRL, EOMI, sclera non-icteric        Nose:   Nares without drainage or epistaxis. Mucosa, turbinates normal  Throat:   Dry mucous membranes. Oropharynx without erythema or exudate.  Neck:   Supple. No carotid bruits.  No thyromegaly.  No lymphadenopathy.   Back:     No CVA tenderness, no spinal tenderness  Lungs:     Clear to auscultation bilaterally, without wheezes, rhonchi or rales  Chest wall:    No tenderness to palpitation  Heart:    Regular rate and rhythm without murmurs, gallops, rubs  Abdomen:     Soft, non-tender, nondistended, normal bowel sounds, no organomegaly  Genitalia:    deferred  Rectal:    deferred  Extremities:   No clubbing, cyanosis or edema.  Pulses:   2+ and symmetric all extremities  Skin:   Skin color, texture, turgor  normal, no rashes or lesions  Lymph nodes:   Cervical, supraclavicular, and axillary nodes normal  Neurologic:   CNII-XII intact. Normal strength, sensation and reflexes      throughout    Labs on Admission:  Basic Metabolic Panel:  Recent Labs Lab 12/08/13 2139 12/08/13 2207  NA 133* 136*  K 3.4* 3.2*  CL 93* 96  CO2 25  --   GLUCOSE 120* 121*  BUN 11 11  CREATININE 1.07 1.30*  CALCIUM 9.3  --    Liver Function Tests:  Recent Labs Lab 12/08/13 2139  AST 22  ALT 13  ALKPHOS 78  BILITOT <0.2*  PROT 7.5  ALBUMIN 3.4*   No results found for this basename: LIPASE, AMYLASE,  in the last 168 hours No results found for this basename: AMMONIA,  in the last 168 hours CBC:  Recent Labs Lab 12/08/13 2139 12/08/13 2207  WBC 7.2  --   NEUTROABS 1.8  --   HGB 13.5 15.0  HCT 38.9 44.0  MCV 93.1  --   PLT 213  --    Cardiac Enzymes: No results found for this basename: CKTOTAL, CKMB, CKMBINDEX, TROPONINI,  in the last 168 hours  BNP (last 3 results) No results found for this basename: PROBNP,  in the last 8760 hours CBG:  Recent Labs Lab 12/08/13 2215  GLUCAP 97    Radiological Exams on Admission: Ct Head (brain) Wo Contrast  12/08/2013   CLINICAL DATA:  Code stroke.  Altered mental status.  EXAM: CT HEAD WITHOUT CONTRAST  TECHNIQUE: Contiguous axial images were obtained from the base of the skull through the vertex without intravenous contrast.  COMPARISON:  CT of the head performed 03/07/2009  FINDINGS: There is no evidence of acute infarction, mass lesion, or intra- or extra-axial hemorrhage on CT.  Diffuse periventricular and subcortical white matter change is thought to reflect small vessel ischemic microangiopathy; this is mildly worsened at the left frontal lobe, without definite evidence of an acute infarct.  The posterior fossa, including the cerebellum, brainstem and fourth ventricle, is within normal limits. The third and lateral ventricles, and basal ganglia  are unremarkable in appearance. The cerebral hemispheres are symmetric in appearance, with normal gray-white differentiation. No mass effect or midline shift is seen.  There is no evidence of fracture; visualized osseous structures are unremarkable in appearance. The orbits are within normal limits. The paranasal sinuses and mastoid air cells are well-aerated. No significant soft tissue abnormalities are seen.  IMPRESSION: 1. No acute intracranial pathology seen on CT. 2. Diffuse small vessel ischemic microangiopathy noted; this is mildly worsened at the left frontal lobe in comparison to 2010, without definite evidence of an acute infarct.  These results were called by telephone at  the time of interpretation on 12/08/2013 at 9:55 PM to Dr. Roseanne Reno, who verbally acknowledged these results.   Electronically Signed   By: Roanna Raider M.D.   On: 12/08/2013 21:56   Dg Chest Portable 1 View  12/09/2013   CLINICAL DATA:  Altered mental status.  EXAM: PORTABLE CHEST - 1 VIEW  COMPARISON:  DG CHEST 2 VIEW dated 04/05/2012  FINDINGS: Cardiomediastinal silhouette is unremarkable. Diffuse interstitial prominence, with central pulmonary vasculature congestion, increased. Strandy densities in lung bases. No pleural effusions. No pneumothorax.  Multiple EKG lines overlie the patient and may obscure subtle underlying pathology. Punctate calcification in the left neck may be vascular. Multiple EKG lines overlie the patient and may obscure subtle underlying pathology.  IMPRESSION: Increasing interstitial prominence, which could reflect pulmonary edema. Bibasilar atelectasis.   Electronically Signed   By: Awilda Metro   On: 12/09/2013 00:13    EKG: Independently reviewed.  Assessment/Plan Principal Problem:   Altered mental status   1. AMS - appears to be delirium although no clear source despite ED work up.  Thus far unremarkable testing includes UA, CXR (increasing interstitial prominence but nothing definite and  no clinical correlation with disease), CBC, BMP, EKG, troponin, EtOH level, CT head, and UDS.  Will continue patients home psychotropic drugs at this time.  Next will check MRI brain in AM.    Code Status: Full  Family Communication: Family at bedside Disposition Plan: Admit to obs  Time spent: 50 min  Tylar Amborn M. [facility] Pager [phone]  If 7AM-7PM, please contact the day team taking care of the patient [email or web address] Password [facility] 12/09/2013, 12:55 AM

## 2013-12-09 NOTE — Progress Notes (Signed)
Patient admitted after midnight. Chart reviewed. Patient examined. Discussed with Dr. Amada JupiterKirkpatrick. He has ordered an EEG but feels this may be primarily psych related. The patient tells me she is annoyed by family members, but won't really answer questions reliably or consistently. She has multiple psychotropic medications listed including benzodiazepines. I question whether she may have become acutely confused if she stopped any of these. She cannot tell me who her psychiatrist is. Will consult psychiatry for medication management.  Danielle [name], M.D. [facility] [phone]

## 2013-12-09 NOTE — Progress Notes (Signed)
Patient remains very lethargic this afternoon after receiving all of her psych meds this morning- risperdal, cogentin, xanax. Arousable enough around dinner for reassessment, stills moves all extremities equally, but falls asleep soon afterwards.  Held 4pm xanax dose due to drowsiness. Will continue to monitor.

## 2013-12-09 NOTE — Progress Notes (Signed)
Subjective: "I have thoughts running in circles in my head that I cannot get to go away."  The patient reports taht she feels that her schizophrenia has been worsening. She states that yesterday in teh sot[place], someone told her she was spending too much money and therefore she was unable to think of anything else and that is why she remained in the store.    I spoke with her daughter who states that her mother has episodes like this during periods of high stress, and when she talked to her on the phone yesterday, she seemed to be under a lot of stress.   She also notes general cognitive decline over the past few years that has been steady. She usually lives with daughter, but leaves during periods when her schizophrenia is less well controlled and has not been living at home for three months due to worsening of her symptoms.   Exam: Filed Vitals:   12/09/13 0700  BP: 160/85  Pulse: 82  Temp: 98 F (36.7 C)  Resp: 18   Gen: In bed, NAD MS: Awake, Alert, oriented to year, but not place. Follows commands.  ZO:XWRUECN:PERRL, EOMI Motor: MAEW Sensory:intact to LT  Impression: 63 yo F with AMS. Her description of hearing someone telling her she was spending too much money sounds like there may have been a psychiatric reason to her not leaving the store. It may be reasonable to perform an EEG and if a seizure predisposition is found, would favor starting AEDs, but not with a normal EEG.   Recommendations: 1) EEG 2) Will follow.   Ritta SlotMcNeill Nadim Malia, MD [facility] [phone]  If 7pm- 7am, please page neurology on call at [phone].

## 2013-12-09 NOTE — Progress Notes (Signed)
Utilization review completed.  

## 2013-12-10 ENCOUNTER — Observation Stay (HOSPITAL_COMMUNITY): Payer: Medicare Other

## 2013-12-10 DIAGNOSIS — Z9119 Patient's noncompliance with other medical treatment and regimen: Secondary | ICD-10-CM

## 2013-12-10 DIAGNOSIS — Z91199 Patient's noncompliance with other medical treatment and regimen due to unspecified reason: Secondary | ICD-10-CM

## 2013-12-10 DIAGNOSIS — F200 Paranoid schizophrenia: Secondary | ICD-10-CM

## 2013-12-10 MED ORDER — ALPRAZOLAM 0.5 MG PO TABS
0.5000 mg | ORAL_TABLET | Freq: Three times a day (TID) | ORAL | Status: DC
  Administered 2013-12-10 – 2013-12-11 (×2): 0.5 mg via ORAL
  Filled 2013-12-10 (×2): qty 1

## 2013-12-10 NOTE — Progress Notes (Signed)
INITIAL NUTRITION ASSESSMENT  DOCUMENTATION CODES Per approved criteria  -Not Applicable   INTERVENTION: RD to continue to monitor and provide intervention as needed  NUTRITION DIAGNOSIS: Predicted sub optimal nutrient intake related to AMS as evidenced by pt's chart.   Goal: Pt to meet >/= 90% of their estimated nutrition needs   Monitor:  PO intake, weight trends, labs  Reason for Assessment: Malnutrition Screening Tool, score of 2  63 y.o. female  Admitting Dx: Altered mental status  ASSESSMENT: 63 y.o. female who presents to the ED with abrupt onset of confusion this afternoon per daughter. While patient does have a history of paranoid schizophrenia in the past she has never had confusion like this previously. EMS was called.   Per MST, pt reported weight loss PTA due to decreased appetite. Pt asleep at time of visit; she appears well nourished. Per nursing notes pt ate 100% of breakfast this morning. RD to continue to monitor po intake; will add supplements if needed.  Height: Ht Readings from Last 1 Encounters:  12/09/13 5\' 4"  (1.626 m)    Weight: Wt Readings from Last 1 Encounters:  12/09/13 141 lb 15.6 oz (64.4 kg)    Ideal Body Weight: 120 lbs  % Ideal Body Weight: 118%  Wt Readings from Last 10 Encounters:  12/09/13 141 lb 15.6 oz (64.4 kg)  08/26/12 139 lb (63.05 kg)  02/10/12 135 lb (61.236 kg)  09/15/11 146 lb 2.6 oz (66.3 kg)  12/03/08 155 lb (70.308 kg)  10/25/08 146 lb (66.225 kg)  08/01/08 146 lb (66.225 kg)  06/20/08 143 lb 8 oz (65.091 kg)  05/28/08 145 lb 8 oz (65.998 kg)  05/24/08 150 lb (68.04 kg)    Usual Body Weight: unknown  % Usual Body Weight: NA  BMI:  Body mass index is 24.36 kg/(m^2).  Estimated Nutritional Needs: Kcal: 1600-1800 Protein: 65-80 grams Fluid: >/= 1.8 L/day  Skin: WDL  Diet Order: General  EDUCATION NEEDS: -No education needs identified at this time   Intake/Output Summary (Last 24 hours) at  12/10/13 1221 Last data filed at 12/10/13 1008  Gross per 24 hour  Intake    240 ml  Output      0 ml  Net    240 ml    Last BM: 2/21   Labs:   Recent Labs Lab 12/08/13 2139 12/08/13 2207 12/09/13 0504  NA 133* 136* 137  K 3.4* 3.2* 3.5*  CL 93* 96 98  CO2 25  --  28  BUN 11 11 11   CREATININE 1.07 1.30* 0.98  CALCIUM 9.3  --  9.4  GLUCOSE 120* 121* 93    CBG (last 3)   Recent Labs  12/08/13 2215  GLUCAP 97    Scheduled Meds: . ALPRAZolam  0.5 mg Oral TID  . benztropine  1 mg Oral BID  . citalopram  40 mg Oral Daily  . gabapentin  300 mg Oral TID  . heparin  5,000 Units Subcutaneous 3 times per day  . ondansetron  4 mg Oral Q6H  . risperiDONE  1 mg Oral BID  . traZODone  200 mg Oral QHS    Continuous Infusions:   Past Medical History  Diagnosis Date  . Hypertension   . Asthma   . Depression   . Bipolar affective disorder, currently depressed, mild   . Stroke   . Coronary artery disease   . Tardive dyskinesia   . Fall 08/25/2012    Recent fall with residual knee  and back pain.    Past Surgical History  Procedure Laterality Date  . Abdominal hysterectomy    . Cardiac catheterization      Ian Malkin RD, LDN Inpatient Clinical Dietitian Pager: [phone] After Hours Pager: [phone]

## 2013-12-10 NOTE — Progress Notes (Signed)
[facility] PROGRESS NOTE  Danielle NeedsGwendolyn [name] UJW:[id number] DOB: 1951-03-22 DOA: 12/08/2013 PCP: No PCP Per Patient  Assessment/Plan: AMS - resolved, EEG pending to r/o seizures- most likely psych related  Lethargy- ? medication related- will decrease xanax- do not think patient was taking her medications at home properly; no sign of infection  Code Status: full Family Communication: granddaughter Disposition Plan:    Consultants:  Neuro  psych  Procedures:  EEG  Antibiotics:    HPI/Subjective: Feels weak all over  Objective: Filed Vitals:   12/10/13 0632  BP: 116/72  Pulse: 92  Temp: 98.3 F (36.8 C)  Resp: 22   No intake or output data in the 24 hours ending 12/10/13 0912 Filed Weights   12/09/13 0257  Weight: 64.4 kg (141 lb 15.6 oz)    Exam:   General:  Speaks only a few words  Cardiovascular: rrr  Respiratory: clear  Abdomen: +BS, soft  Musculoskeletal: poor effort, no swelling  Data Reviewed: Basic Metabolic Panel:  Recent Labs Lab 12/08/13 2139 12/08/13 2207 12/09/13 0504  NA 133* 136* 137  K 3.4* 3.2* 3.5*  CL 93* 96 98  CO2 25  --  28  GLUCOSE 120* 121* 93  BUN 11 11 11   CREATININE 1.07 1.30* 0.98  CALCIUM 9.3  --  9.4   Liver Function Tests:  Recent Labs Lab 12/08/13 2139 12/09/13 0504  AST 22 19  ALT 13 12  ALKPHOS 78 73  BILITOT <0.2* <0.2*  PROT 7.5 7.1  ALBUMIN 3.4* 3.3*   No results found for this basename: LIPASE, AMYLASE,  in the last 168 hours No results found for this basename: AMMONIA,  in the last 168 hours CBC:  Recent Labs Lab 12/08/13 2139 12/08/13 2207 12/09/13 0504  WBC 7.2  --  6.1  NEUTROABS 1.8  --   --   HGB 13.5 15.0 13.3  HCT 38.9 44.0 38.4  MCV 93.1  --  93.4  PLT 213  --  207   Cardiac Enzymes: No results found for this basename: CKTOTAL, CKMB, CKMBINDEX, TROPONINI,  in the last 168 hours BNP (last 3 results) No results found for this basename: PROBNP,  in the last 8760  hours CBG:  Recent Labs Lab 12/08/13 2215  GLUCAP 97    No results found for this or any previous visit (from the past 240 hour(s)).   Studies: Ct Head (brain) Wo Contrast  12/08/2013   CLINICAL DATA:  Code stroke.  Altered mental status.  EXAM: CT HEAD WITHOUT CONTRAST  TECHNIQUE: Contiguous axial images were obtained from the base of the skull through the vertex without intravenous contrast.  COMPARISON:  CT of the head performed 03/07/2009  FINDINGS: There is no evidence of acute infarction, mass lesion, or intra- or extra-axial hemorrhage on CT.  Diffuse periventricular and subcortical white matter change is thought to reflect small vessel ischemic microangiopathy; this is mildly worsened at the left frontal lobe, without definite evidence of an acute infarct.  The posterior fossa, including the cerebellum, brainstem and fourth ventricle, is within normal limits. The third and lateral ventricles, and basal ganglia are unremarkable in appearance. The cerebral hemispheres are symmetric in appearance, with normal gray-white differentiation. No mass effect or midline shift is seen.  There is no evidence of fracture; visualized osseous structures are unremarkable in appearance. The orbits are within normal limits. The paranasal sinuses and mastoid air cells are well-aerated. No significant soft tissue abnormalities are seen.  IMPRESSION: 1. No acute intracranial  pathology seen on CT. 2. Diffuse small vessel ischemic microangiopathy noted; this is mildly worsened at the left frontal lobe in comparison to 2010, without definite evidence of an acute infarct.  These results were called by telephone at the time of interpretation on 12/08/2013 at 9:55 PM to Dr. Roseanne Reno, who verbally acknowledged these results.   Electronically Signed   By: Roanna Raider M.D.   On: 12/08/2013 21:56   Mr Brain Wo Contrast  12/09/2013   CLINICAL DATA:  Altered mental status  EXAM: MRI HEAD WITHOUT CONTRAST  TECHNIQUE:  Multiplanar, multiecho pulse sequences of the brain and surrounding structures were obtained without intravenous contrast.  COMPARISON:  CT head 12/08/2013  FINDINGS: Negative for acute infarct.  Ventricle size is normal. Cerebral volume is normal. Patchy hyperintensity in the cerebral white matter bilaterally most consistent with chronic microvascular ischemia. Mild hyperintensity in the pons also suggestive of chronic microvascular ischemia.  Negative for mass or edema. Focus of microhemorrhage left parietal lobe which appears chronic. No fluid collection or shift of the midline structures.  Paranasal sinuses are clear.  IMPRESSION: Chronic microvascular ischemia.  No acute abnormality.   Electronically Signed   By: Marlan Palau M.D.   On: 12/09/2013 10:16   Dg Chest Portable 1 View  12/09/2013   CLINICAL DATA:  Altered mental status.  EXAM: PORTABLE CHEST - 1 VIEW  COMPARISON:  DG CHEST 2 VIEW dated 04/05/2012  FINDINGS: Cardiomediastinal silhouette is unremarkable. Diffuse interstitial prominence, with central pulmonary vasculature congestion, increased. Strandy densities in lung bases. No pleural effusions. No pneumothorax.  Multiple EKG lines overlie the patient and may obscure subtle underlying pathology. Punctate calcification in the left neck may be vascular. Multiple EKG lines overlie the patient and may obscure subtle underlying pathology.  IMPRESSION: Increasing interstitial prominence, which could reflect pulmonary edema. Bibasilar atelectasis.   Electronically Signed   By: Awilda Metro   On: 12/09/2013 00:13    Scheduled Meds: . ALPRAZolam  1 mg Oral TID  . benztropine  1 mg Oral BID  . citalopram  40 mg Oral Daily  . gabapentin  300 mg Oral TID  . heparin  5,000 Units Subcutaneous 3 times per day  . ondansetron  4 mg Oral Q6H  . risperiDONE  1 mg Oral BID  . traZODone  200 mg Oral QHS   Continuous Infusions:   Principal Problem:   Altered mental status    Time spent: 25  min    Martina Brodbeck  [facility] Pager [phone]. If 7PM-7AM, please contact night-coverage at [email or web address], password [facility] 12/10/2013, 9:12 AM  LOS: 2 days

## 2013-12-10 NOTE — Progress Notes (Signed)
EEG completed; results pending.    

## 2013-12-10 NOTE — Procedures (Signed)
History: 63 yo F with ams   Sedation: None  Technique: This is a 17 channel routine scalp EEG performed at the bedside with bipolar and monopolar montages arranged in accordance to the international 10/20 system of electrode placement. One channel was dedicated to EKG recording.    Background: There is a well defined posterior dominant rhythm of 8.5 Hz that attenuates with eye opening. During maximal wakefulness there is intermixed alpha and beta activities. There eis a nincrease in delta activity with drowsiness and sleep is observed.   Photic stimulation: Physiologic driving is Not performed  EEG Abnormalities: None  Clinical Interpretation: This normal EEG is recorded in the waking and sleep state. There was no seizure or seizure predisposition recorded on this study.   Ritta SlotMcNeill Milda Lindvall, MD [facility] [phone]  If 7pm- 7am, please page neurology on call at [phone].

## 2013-12-10 NOTE — Progress Notes (Signed)
PT Cancellation Note  Patient Details Name: Danielle [name] MRN: [id number] DOB: [birth year]   Cancelled Treatment:    Reason Eval/Treat Not Completed: Patient at procedure or test/unavailable (off the floor to EEG)   Fabio AsaWerner, Yazan Gatling J 12/10/2013, 2:32 PM Charlotte Crumbevon Otha Rickles, PT DPT  [phone]

## 2013-12-10 NOTE — Progress Notes (Signed)
Subjective: No significant changes overnight.   Exam: Filed Vitals:   12/10/13 0632  BP: 116/72  Pulse: 92  Temp: 98.3 F (36.8 C)  Resp: 22   Gen: In bed, NAD MS: Awake, Alert, oriented to year, but not place. Follows commands.  ZO:XWRUECN:PERRL, EOMI Motor: MAEW Sensory:intact to LT  Impression: 63 yo F with AMS. Her description of hearing someone telling her she was spending too much money sounds like there may have been a psychiatric reason to her not leaving the store. Given the lack of epileptiform activity.  EEG, I would not favor starting AEDs at this time. It is possible given history outlined in yesterday's that she has some underlying mild cognitive impairment or early dementia as well, but I suspect more of a worsening schizophrenia.   Recommendations: 1) Will sign off at this time. Please call with any fruther question or concerns.   Ritta SlotMcNeill Breonia Kirstein, MD [facility] [phone]  If 7pm- 7am, please page neurology on call at [phone].

## 2013-12-11 LAB — GLUCOSE, CAPILLARY: Glucose-Capillary: 102 mg/dL — ABNORMAL HIGH (ref 70–99)

## 2013-12-11 MED ORDER — GI COCKTAIL ~~LOC~~
30.0000 mL | Freq: Once | ORAL | Status: AC
  Administered 2013-12-11: 30 mL via ORAL
  Filled 2013-12-11: qty 30

## 2013-12-11 NOTE — Progress Notes (Signed)
Talked to patient about DCP; patient resides in a homeless shelter at the [facility] and plans to stay there for a year. Patient stated that she chose to stay there because her family members were taking her "check" and spending it on themselves and giving her a small amount of it; she has a brother and daughter that checks on her but does not want to stay with them and stated " I want to go back to the shelter;"   CM asked patient what does she do during the day, she stated that she [facility] to [facility], walk around and go to the church; patient stated that she has [facility] and is happy there; she shares her room with 3 other people;  Abelino DerrickB Artie Mcintyre RN,BSN,MHA [phone]

## 2013-12-11 NOTE — Progress Notes (Signed)
[facility]  Wanda Martin, CaliThe Endoscopy Center At St Francis LLCforniaRN  [phone]  Jinny SandersJamie Fuller, case manager [phone]

## 2013-12-11 NOTE — Clinical Social Work Psych Note (Signed)
Psych CSW aware of consult and now following.    Vickii PennaGina Cassie Henkels, LCSWA [phone]  Clinical Social Work

## 2013-12-11 NOTE — Progress Notes (Addendum)
Physical Therapy Evaluation Patient Details Name: Danielle [name] MRN: [id number] DOB: [birth year] Today's Date: 12/11/2013 Time: 4098-1191 PT Time Calculation (min): 22 min  PT Assessment / Plan / Recommendation History of Present Illness  HPI: Danielle [name] is a 63 y.o. female who presents to the ED with abrupt onset of confusion this afternoon per daughter.  Daughter drove patient to the store about 1 hour PTA.  Patient went in to the store, but didn't come out after a prolonged period of time which prompted daughter to go in after her.  Daughter went into the store to find patient acutely confused, and playing with the wall.  While patient does have a history of paranoid schizophrenia in the past she has never had confusion like this previously.  EMS was called.  Patient was brought in as a code stroke, but this was canceled after no focal neurologic deficits found on exam by neurology after arrival.  Clinical Impression  Pt at baseline, NO acute PT needed, recommend cane.    PT Assessment  Patent does not need any further PT services    Follow Up Recommendations  No PT follow up    Does the patient have the potential to tolerate intense rehabilitation      Barriers to Discharge        Equipment Recommendations  Cane    Recommendations for Other Services     Frequency      Precautions / Restrictions Restrictions Weight Bearing Restrictions: No   Pertinent Vitals/Pain Modest pain in shoulder      Mobility  Bed Mobility Overal bed mobility: Modified Independent General bed mobility comments: increased time to perform Transfers Overall transfer level: Independent Ambulation/Gait Ambulation/Gait assistance: Modified independent (Device/Increase time) Ambulation Distance (Feet): 640 Feet Assistive device: 1 person hand held assist;None (initially hand held, but able to ambulate without device) Gait Pattern/deviations: Step-through pattern General Gait Details:  steady with ambulation Stairs: Yes Stairs assistance: Independent Stair Management: Forwards;One rail Right Number of Stairs: 5 General stair comments: steady no assist or cues.       PT Goals(Current goals can be found in the care plan section) Acute Rehab PT Goals PT Goal Formulation: No goals set, d/c therapy  Visit Information  Last PT Received On: 12/11/13 Assistance Needed: +1 History of Present Illness: HPI: Danielle [name] is a 63 y.o. female who presents to the ED with abrupt onset of confusion this afternoon per daughter.  Daughter drove patient to the store about 1 hour PTA.  Patient went in to the store, but didn't come out after a prolonged period of time which prompted daughter to go in after her.  Daughter went into the store to find patient acutely confused, and playing with the wall.  While patient does have a history of paranoid schizophrenia in the past she has never had confusion like this previously.  EMS was called.  Patient was brought in as a code stroke, but this was canceled after no focal neurologic deficits found on exam by neurology after arrival.       Prior Functioning  Home Living Family/patient expects to be discharged to:: Shelter/Homeless Prior Function Level of Independence: Independent with assistive device(s) Comments: borrowed a cane from a friend before Communication Communication: No difficulties Dominant Hand: Right    Cognition  Cognition Arousal/Alertness: Awake/alert Behavior During Therapy: WFL for tasks assessed/performed Overall Cognitive Status: Within Functional Limits for tasks assessed    Extremity/Trunk Assessment Upper Extremity Assessment Upper Extremity Assessment: Defer to OT evaluation  Lower Extremity Assessment Lower Extremity Assessment: Overall WFL for tasks assessed   Balance Balance Overall balance assessment: No apparent balance deficits (not formally assessed)  End of Session PT - End of Session Equipment  Utilized During Treatment: Gait belt Activity Tolerance: Patient tolerated treatment well Patient left: in bed;with family/visitor present (sitting EOB) Nurse Communication: Mobility status  GP Functional Assessment Tool Used: clinical judgement Functional Limitation: Mobility: Walking and moving around Mobility: Walking and Moving Around Current Status ([id number](G8978): At least 1 percent but less than 20 percent impaired, limited or restricted Mobility: Walking and Moving Around Goal Status [phone](G8979): At least 1 percent but less than 20 percent impaired, limited or restricted Mobility: Walking and Moving Around Discharge Status [phone](G8980): At least 1 percent but less than 20 percent impaired, limited or restricted   Fabio AsaWerner, Arisbeth Purrington J 12/11/2013, 9:35 AM Charlotte Crumbevon Shamere Dilworth, PT DPT  [phone]

## 2013-12-11 NOTE — Consult Note (Signed)
[facility] Face-to-Face Psychiatry Consult   Reason for Consult:  Schizophrenia, change in mental status Referring Physician:  Dr Danielle [name] is an 63 y.o. female. Total Time spent with patient: 30 minutes  Assessment: AXIS I:  Paranoid schizophrenia AXIS II:  Deferred AXIS III:   Past Medical History  Diagnosis Date  . Hypertension   . Asthma   . Depression   . Bipolar affective disorder, currently depressed, mild   . Stroke   . Coronary artery disease   . Tardive dyskinesia   . Fall 08/25/2012    Recent fall with residual knee and back pain.   AXIS IV:  economic problems, other psychosocial or environmental problems, problems related to social environment and problems with primary support group AXIS V:  51-60 moderate symptoms  Plan:  No evidence of imminent risk to self or others at present.   Patient does not meet criteria for psychiatric inpatient admission. Supportive therapy provided about ongoing stressors. Discussed crisis plan, support from social network, calling 911, coming to the Emergency Department, and calling Suicide Hotline.  Subjective:   Danielle [name] is a 63 y.o. female patient admitted with change in her mental status.  HPI:  Patient seen chart reviewed.  Patient a 63 year old African American female who was admitted to the medical floor because of confusion.  Her daughter called EMS because patient was confused and playing with the wall.  She had left her in the store but patient did not return and she went back to see her and find her very confused.  Patient was seen by neurology.  Due to history of schizophrenia the psych consult was called.  Patient admitted long history of paranoid schizophrenia.  She has been admitted to a psych hospital in the past.  Her last psychiatric admission at [facility] was November 2013.  She was discharged on Risperdal, trazodone, Celexa and Cogentin with a followup to see at [facility].  Patient reported that she has been seeing  psychiatrist at [facility] and recently there has been no changes in her medication.  She admitted some time hearing voices but they are chronic and less intense from the past.  She denies any suicidal thoughts or homicidal thoughts.  She admitted confused when she arrived to the hospital but her condition is getting better.  Her mental status is also improved from the past.  Currently she is concerned about her living situation.  She lives at [employer] because her children does not let her live.  Patient was complaining about her children who does get money from her but does not financially support her.  Patient denies any suicidal thoughts or homicidal thoughts.  She denies any aggression, violence or any impulsive behavior.  She remembers taking her medication on time.  Her blood alcohol level in the UDS is negative.  Patient denies any side effects of medication. HPI Elements:   Location:  Confusion. Quality:  Mild. Severity:  Mild.  Past Psychiatric History: Past Medical History  Diagnosis Date  . Hypertension   . Asthma   . Depression   . Bipolar affective disorder, currently depressed, mild   . Stroke   . Coronary artery disease   . Tardive dyskinesia   . Fall 08/25/2012    Recent fall with residual knee and back pain.    reports that she has been smoking Cigarettes.  She has been smoking about 0.00 packs per day. She does not have any smokeless tobacco history on file. She reports that she  drinks alcohol. She reports that she uses illicit drugs (Cocaine and Marijuana). Family History  Problem Relation Age of Onset  . Hypertension Mother          Abuse/Neglect [name]) Physical Abuse: Denies Verbal Abuse: Denies Sexual Abuse: Denies Allergies:   Allergies  Allergen Reactions  . Clonazepam Swelling    Pt was recently given a triamterene hydrochlorothiazide combination as well as clonazepam and had an allergic reaction to one of them. Caused swelling of face and eyes  .  Doxycycline Swelling    Swelling of face and eyes  . Fosinopril Sodium Swelling     swelling face,lips-angioedema from ACE I  . Hydrochlorothiazide W-Triamterene Swelling    Pt was recently given a triamterene hydrochlorothiazide combination as well as clonazepam and had an allergic reaction to one of them. Swelling of face and eyes  . Penicillins Swelling    Face and eyes  . Sulfonamide Derivatives Other (See Comments)    Unknown from childhood    ACT Assessment Complete:  Yes:    Educational Status    Risk to Self: Risk to self Is patient at risk for suicide?: No Substance abuse history and/or treatment for substance abuse?: No  Risk to Others:    Abuse: Abuse/Neglect Assessment (Assessment to be complete while patient is alone) Physical Abuse: Denies Verbal Abuse: Denies Sexual Abuse: Denies Exploitation of patient/patient's resources: Denies Self-Neglect: Denies  Prior Inpatient Therapy:    Prior Outpatient Therapy:    Additional Information:                    Objective: Blood pressure 131/84, pulse 84, temperature 98.6 F (37 C), temperature source Oral, resp. rate 20, height 5' 4"  (1.626 m), weight 141 lb 15.6 oz (64.4 kg), SpO2 94.00%.Body mass index is 24.36 kg/(m^2). Results for orders placed during the hospital encounter of 12/08/13 (from the past 72 hour(s))  PROTIME-INR     Status: None   Collection Time    12/08/13  9:39 PM      Result Value Ref Range   Prothrombin Time 13.0  11.6 - 15.2 seconds   INR 1.00  0.00 - 1.49  APTT     Status: None   Collection Time    12/08/13  9:39 PM      Result Value Ref Range   aPTT 29  24 - 37 seconds  CBC     Status: None   Collection Time    12/08/13  9:39 PM      Result Value Ref Range   WBC 7.2  4.0 - 10.5 K/uL   RBC 4.18  3.87 - 5.11 MIL/uL   Hemoglobin 13.5  12.0 - 15.0 g/dL   HCT 38.9  36.0 - 46.0 %   MCV 93.1  78.0 - 100.0 fL   MCH 32.3  26.0 - 34.0 pg   MCHC 34.7  30.0 - 36.0 g/dL   RDW 13.2   11.5 - 15.5 %   Platelets 213  150 - 400 K/uL  DIFFERENTIAL     Status: Abnormal   Collection Time    12/08/13  9:39 PM      Result Value Ref Range   Neutrophils Relative % 25 (*) 43 - 77 %   Neutro Abs 1.8  1.7 - 7.7 K/uL   Lymphocytes Relative 64 (*) 12 - 46 %   Lymphs Abs 4.6 (*) 0.7 - 4.0 K/uL   Monocytes Relative 7  3 - 12 %  Monocytes Absolute 0.5  0.1 - 1.0 K/uL   Eosinophils Relative 3  0 - 5 %   Eosinophils Absolute 0.2  0.0 - 0.7 K/uL   Basophils Relative 1  0 - 1 %   Basophils Absolute 0.0  0.0 - 0.1 K/uL  COMPREHENSIVE METABOLIC PANEL     Status: Abnormal   Collection Time    12/08/13  9:39 PM      Result Value Ref Range   Sodium 133 (*) 137 - 147 mEq/L   Potassium 3.4 (*) 3.7 - 5.3 mEq/L   Chloride 93 (*) 96 - 112 mEq/L   CO2 25  19 - 32 mEq/L   Glucose, Bld 120 (*) 70 - 99 mg/dL   BUN 11  6 - 23 mg/dL   Creatinine, Ser 1.07  0.50 - 1.10 mg/dL   Calcium 9.3  8.4 - 10.5 mg/dL   Total Protein 7.5  6.0 - 8.3 g/dL   Albumin 3.4 (*) 3.5 - 5.2 g/dL   AST 22  0 - 37 U/L   ALT 13  0 - 35 U/L   Alkaline Phosphatase 78  39 - 117 U/L   Total Bilirubin <0.2 (*) 0.3 - 1.2 mg/dL   GFR calc non Af Amer 55 (*) >90 mL/min   GFR calc Af Amer 64 (*) >90 mL/min   Comment: (NOTE)     The eGFR has been calculated using the CKD EPI equation.     This calculation has not been validated in all clinical situations.     eGFR's persistently <90 mL/min signify possible Chronic Kidney     Disease.  Randolm Idol, ED     Status: None   Collection Time    12/08/13 10:06 PM      Result Value Ref Range   Troponin i, poc 0.01  0.00 - 0.08 ng/mL   Comment 3            Comment: Due to the release kinetics of cTnI,     a negative result within the first hours     of the onset of symptoms does not rule out     myocardial infarction with certainty.     If myocardial infarction is still suspected,     repeat the test at appropriate intervals.  I-STAT CHEM 8, ED     Status: Abnormal    Collection Time    12/08/13 10:07 PM      Result Value Ref Range   Sodium 136 (*) 137 - 147 mEq/L   Potassium 3.2 (*) 3.7 - 5.3 mEq/L   Chloride 96  96 - 112 mEq/L   BUN 11  6 - 23 mg/dL   Creatinine, Ser 1.30 (*) 0.50 - 1.10 mg/dL   Glucose, Bld 121 (*) 70 - 99 mg/dL   Calcium, Ion 1.24  1.13 - 1.30 mmol/L   TCO2 29  0 - 100 mmol/L   Hemoglobin 15.0  12.0 - 15.0 g/dL   HCT 44.0  36.0 - 46.0 %  CBG MONITORING, ED     Status: None   Collection Time    12/08/13 10:15 PM      Result Value Ref Range   Glucose-Capillary 97  70 - 99 mg/dL  PREGNANCY, URINE     Status: None   Collection Time    12/08/13 10:37 PM      Result Value Ref Range   Preg Test, Ur NEGATIVE  NEGATIVE   Comment:  THE SENSITIVITY OF THIS     METHODOLOGY IS >20 mIU/mL.  URINE RAPID DRUG SCREEN (HOSP PERFORMED)     Status: None   Collection Time    12/08/13 10:57 PM      Result Value Ref Range   Opiates NONE DETECTED  NONE DETECTED   Cocaine NONE DETECTED  NONE DETECTED   Benzodiazepines NONE DETECTED  NONE DETECTED   Amphetamines NONE DETECTED  NONE DETECTED   Tetrahydrocannabinol NONE DETECTED  NONE DETECTED   Barbiturates NONE DETECTED  NONE DETECTED   Comment:            DRUG SCREEN FOR MEDICAL PURPOSES     ONLY.  IF CONFIRMATION IS NEEDED     FOR ANY PURPOSE, NOTIFY LAB     WITHIN 5 DAYS.                LOWEST DETECTABLE LIMITS     FOR URINE DRUG SCREEN     Drug Class       Cutoff (ng/mL)     Amphetamine      1000     Barbiturate      200     Benzodiazepine   630     Tricyclics       160     Opiates          300     Cocaine          300     THC              50  URINALYSIS, ROUTINE W REFLEX MICROSCOPIC     Status: None   Collection Time    12/08/13 10:57 PM      Result Value Ref Range   Color, Urine YELLOW  YELLOW   APPearance CLEAR  CLEAR   Specific Gravity, Urine 1.013  1.005 - 1.030   pH 6.0  5.0 - 8.0   Glucose, UA NEGATIVE  NEGATIVE mg/dL   Hgb urine dipstick NEGATIVE   NEGATIVE   Bilirubin Urine NEGATIVE  NEGATIVE   Ketones, ur NEGATIVE  NEGATIVE mg/dL   Protein, ur NEGATIVE  NEGATIVE mg/dL   Urobilinogen, UA 0.2  0.0 - 1.0 mg/dL   Nitrite NEGATIVE  NEGATIVE   Leukocytes, UA NEGATIVE  NEGATIVE   Comment: MICROSCOPIC NOT DONE ON URINES WITH NEGATIVE PROTEIN, BLOOD, LEUKOCYTES, NITRITE, OR GLUCOSE <1000 mg/dL.  ETHANOL     Status: None   Collection Time    12/08/13 11:31 PM      Result Value Ref Range   Alcohol, Ethyl (B) <11  0 - 11 mg/dL   Comment:            LOWEST DETECTABLE LIMIT FOR     SERUM ALCOHOL IS 11 mg/dL     FOR MEDICAL PURPOSES ONLY  CBC     Status: None   Collection Time    12/09/13  5:04 AM      Result Value Ref Range   WBC 6.1  4.0 - 10.5 K/uL   RBC 4.11  3.87 - 5.11 MIL/uL   Hemoglobin 13.3  12.0 - 15.0 g/dL   HCT 38.4  36.0 - 46.0 %   MCV 93.4  78.0 - 100.0 fL   MCH 32.4  26.0 - 34.0 pg   MCHC 34.6  30.0 - 36.0 g/dL   RDW 13.4  11.5 - 15.5 %   Platelets 207  150 - 400 K/uL  COMPREHENSIVE METABOLIC PANEL  Status: Abnormal   Collection Time    12/09/13  5:04 AM      Result Value Ref Range   Sodium 137  137 - 147 mEq/L   Potassium 3.5 (*) 3.7 - 5.3 mEq/L   Chloride 98  96 - 112 mEq/L   CO2 28  19 - 32 mEq/L   Glucose, Bld 93  70 - 99 mg/dL   BUN 11  6 - 23 mg/dL   Creatinine, Ser 0.98  0.50 - 1.10 mg/dL   Calcium 9.4  8.4 - 10.5 mg/dL   Total Protein 7.1  6.0 - 8.3 g/dL   Albumin 3.3 (*) 3.5 - 5.2 g/dL   AST 19  0 - 37 U/L   ALT 12  0 - 35 U/L   Alkaline Phosphatase 73  39 - 117 U/L   Total Bilirubin <0.2 (*) 0.3 - 1.2 mg/dL   GFR calc non Af Amer 61 (*) >90 mL/min   GFR calc Af Amer 71 (*) >90 mL/min   Comment: (NOTE)     The eGFR has been calculated using the CKD EPI equation.     This calculation has not been validated in all clinical situations.     eGFR's persistently <90 mL/min signify possible Chronic Kidney     Disease.  GLUCOSE, CAPILLARY     Status: Abnormal   Collection Time    12/11/13  7:07 AM       Result Value Ref Range   Glucose-Capillary 102 (*) 70 - 99 mg/dL   Labs are reviewed.  Current Facility-Administered Medications  Medication Dose Route Frequency Provider Last Rate Last Dose  . ALPRAZolam Duanne Moron) tablet 0.5 mg  0.5 mg Oral TID Geradine Girt, DO   0.5 mg at 12/10/13 2102  . benztropine (COGENTIN) tablet 1 mg  1 mg Oral BID Etta Quill, DO   1 mg at 12/11/13 1021  . citalopram (CELEXA) tablet 40 mg  40 mg Oral Daily Etta Quill, DO   40 mg at 12/11/13 1021  . gabapentin (NEURONTIN) capsule 300 mg  300 mg Oral TID Etta Quill, DO   300 mg at 12/11/13 1021  . heparin injection 5,000 Units  5,000 Units Subcutaneous 3 times per day Etta Quill, DO   5,000 Units at 12/11/13 8937  . ibuprofen (ADVIL,MOTRIN) tablet 600 mg  600 mg Oral Q6H PRN Etta Quill, DO   600 mg at 12/09/13 0605  . ondansetron (ZOFRAN) tablet 4 mg  4 mg Oral Q6H Etta Quill, DO   4 mg at 12/11/13 1021  . risperiDONE (RISPERDAL) tablet 1 mg  1 mg Oral BID Etta Quill, DO   1 mg at 12/11/13 1021  . traZODone (DESYREL) tablet 200 mg  200 mg Oral QHS Etta Quill, DO   200 mg at 12/10/13 2102    Psychiatric Specialty Exam:     Blood pressure 131/84, pulse 84, temperature 98.6 F (37 C), temperature source Oral, resp. rate 20, height 5' 4"  (1.626 m), weight 141 lb 15.6 oz (64.4 kg), SpO2 94.00%.Body mass index is 24.36 kg/(m^2).  General Appearance: Casual and Disheveled  Eye Contact::  Fair  Speech:  Slow  Volume:  Decreased  Mood:  Anxious  Affect:  Constricted  Thought Process:  Logical  Orientation:  Full (Time, Place, and Person)  Thought Content:  Hallucinations: Auditory Patient hear her name  Suicidal Thoughts:  No  Homicidal Thoughts:  No  Memory:  Immediate;  Fair Recent;   Fair Remote;   Fair  Judgement:  Fair  Insight:  Fair  Psychomotor Activity:  Decreased  Concentration:  Fair  Recall:  [employer] of Knowledge:Fair  Language: Fair  Akathisia:   No  Handed:  Right  AIMS (if indicated):     Assets:  Communication Skills Desire for Improvement  Sleep:      Musculoskeletal: Strength & Muscle Tone: within normal limits Gait & Station: normal Patient leans: N/A  Treatment Plan Summary: Medication management, continue current medication.  Patient does not need inpatient psychiatric services at this time.  Patient can be seen at [facility] for her medication management.  She is aware that this medicine does not work that she can call [facility] to get early appointment.  Please call [phone] if you have further questions  Aideliz Garmany T. 12/11/2013 4:10 PM

## 2013-12-11 NOTE — Progress Notes (Signed)
[facility] PROGRESS NOTE  Danielle [name] ONG:[id number] DOB: December 08, 1950 DOA: 12/08/2013 PCP: No PCP Per Patient  Assessment/Plan: AMS - resolved, EEG negative to r/o seizures- most likely psych related- awaiting psych consult  Lethargy- ? medication related- will decrease xanax- do not think patient was taking her medications at home properly; no sign of infection- await psych involvement  From shelter-- ? D/c plan  Code Status: full Family Communication: granddaughter Disposition Plan:    Consultants:  Neuro  psych  Procedures:  EEG  Antibiotics:    HPI/Subjective: Says she has a tremor  Objective: Filed Vitals:   12/11/13 0700  BP: 131/84  Pulse: 84  Temp: 98.6 F (37 C)  Resp: 20    Intake/Output Summary (Last 24 hours) at 12/11/13 1017 Last data filed at 12/10/13 1800  Gross per 24 hour  Intake    120 ml  Output      0 ml  Net    120 ml   Filed Weights   12/09/13 0257  Weight: 64.4 kg (141 lb 15.6 oz)    Exam:   General:  Speaks only a few words  Cardiovascular: rrr  Respiratory: clear  Abdomen: +BS, soft  Musculoskeletal: moves all 4 ext  Data Reviewed: Basic Metabolic Panel:  Recent Labs Lab 12/08/13 2139 12/08/13 2207 12/09/13 0504  NA 133* 136* 137  K 3.4* 3.2* 3.5*  CL 93* 96 98  CO2 25  --  28  GLUCOSE 120* 121* 93  BUN 11 11 11   CREATININE 1.07 1.30* 0.98  CALCIUM 9.3  --  9.4   Liver Function Tests:  Recent Labs Lab 12/08/13 2139 12/09/13 0504  AST 22 19  ALT 13 12  ALKPHOS 78 73  BILITOT <0.2* <0.2*  PROT 7.5 7.1  ALBUMIN 3.4* 3.3*   No results found for this basename: LIPASE, AMYLASE,  in the last 168 hours No results found for this basename: AMMONIA,  in the last 168 hours CBC:  Recent Labs Lab 12/08/13 2139 12/08/13 2207 12/09/13 0504  WBC 7.2  --  6.1  NEUTROABS 1.8  --   --   HGB 13.5 15.0 13.3  HCT 38.9 44.0 38.4  MCV 93.1  --  93.4  PLT 213  --  207   Cardiac Enzymes: No  results found for this basename: CKTOTAL, CKMB, CKMBINDEX, TROPONINI,  in the last 168 hours BNP (last 3 results) No results found for this basename: PROBNP,  in the last 8760 hours CBG:  Recent Labs Lab 12/08/13 2215 12/11/13 0707  GLUCAP 97 102*    No results found for this or any previous visit (from the past 240 hour(s)).   Studies: No results found.  Scheduled Meds: . ALPRAZolam  0.5 mg Oral TID  . benztropine  1 mg Oral BID  . citalopram  40 mg Oral Daily  . gabapentin  300 mg Oral TID  . heparin  5,000 Units Subcutaneous 3 times per day  . ondansetron  4 mg Oral Q6H  . risperiDONE  1 mg Oral BID  . traZODone  200 mg Oral QHS   Continuous Infusions:   Principal Problem:   Altered mental status    Time spent: 25 min    [name]  [facility] Pager [phone]. If 7PM-7AM, please contact night-coverage at [email or web address], password [email or web address] 12/11/2013, 10:17 AM  LOS: 3 days

## 2013-12-12 MED ORDER — TRAMADOL HCL 50 MG PO TABS: 50.0000 mg | ORAL_TABLET | Freq: Two times a day (BID) | ORAL | Status: DC | PRN

## 2013-12-12 MED ORDER — ALPRAZOLAM 0.25 MG PO TABS: 0.2500 mg | ORAL_TABLET | Freq: Every evening | ORAL | Status: DC | PRN

## 2013-12-12 NOTE — Progress Notes (Signed)
Discharge instructions given. Pt verbalized understanding and all questions were answered.  

## 2013-12-12 NOTE — Discharge Summary (Addendum)
Physician Discharge Summary  Danielle [name] OZH:[id number] DOB: Jun 03, 1951 DOA: 12/08/2013  PCP: No PCP Per Patient  Admit date: 12/08/2013 Discharge date: 12/12/2013  Time spent:  35 minutes  Recommendations for Outpatient Follow-up:  1. Continue meds as prescribed  Discharge Diagnoses:  Principal Problem:   Altered mental status Active Problems:   Schizophrenia, paranoid type   Substance induced mood disorder   Discharge Condition: improved  Diet recommendation: regular  Filed Weights   12/09/13 0257  Weight: 64.4 kg (141 lb 15.6 oz)    History of present illness:  Danielle [name] is a 63 y.o. female who presents to the ED with abrupt onset of confusion this afternoon per daughter. Daughter drove patient to the store about 1 hour PTA. Patient went in to the store, but didn't come out after a prolonged period of time which prompted daughter to go in after her. Daughter went into the store to find patient acutely confused, and playing with the wall. While patient does have a history of paranoid schizophrenia in the past she has never had confusion like this previously. EMS was called. Patient was brought in as a code stroke, but this was canceled after no focal neurologic deficits found on exam by neurology after arrival.   Hospital Course:  AMS - resolved, EEG negative to r/o seizures- most likely psych related- psych consult: no need for inpatient psych- continue meds as prescribed  paranoid schizo- continue meds Lethargy- ? medication related- decrease xanax- do not think patient was taking her medications at home properly; no sign of infection- resolved   Procedures:  EEG  Consultations:  Neuro  psych  Discharge Exam: Filed Vitals:   12/12/13 0643  BP: 127/76  Pulse: 85  Temp: 98.1 F (36.7 C)  Resp: 18    General: A+Ox3, NAD Cardiovascular: rrr Respiratory: clear  Discharge Instructions      Discharge Orders   Future Orders Complete By Expires    Diet general  As directed    Increase activity slowly  As directed        Medication List    STOP taking these medications       diphenhydrAMINE 25 MG tablet  Commonly known as:  BENADRYL      TAKE these medications       ALPRAZolam 0.25 MG tablet  Commonly known as:  XANAX  Take 1 tablet (0.25 mg total) by mouth at bedtime as needed for anxiety.     benztropine 1 MG tablet  Commonly known as:  COGENTIN  Take 1 mg by mouth 2 (two) times daily.     citalopram 40 MG tablet  Commonly known as:  CELEXA  Take 40 mg by mouth daily.     gabapentin 300 MG capsule  Commonly known as:  NEURONTIN  Take 300 mg by mouth 3 (three) times daily.     ondansetron 4 MG tablet  Commonly known as:  ZOFRAN  Take 1 tablet (4 mg total) by mouth every 6 (six) hours.     risperiDONE 1 MG tablet  Commonly known as:  RISPERDAL  Take 1 mg by mouth 2 (two) times daily.     traMADol 50 MG tablet  Commonly known as:  ULTRAM  Take 1 tablet (50 mg total) by mouth every 12 (twelve) hours as needed for moderate pain.     traZODone 100 MG tablet  Commonly known as:  DESYREL  Take 200 mg by mouth at bedtime.       Allergies  Allergen Reactions  . Clonazepam Swelling    Pt was recently given a triamterene hydrochlorothiazide combination as well as clonazepam and had an allergic reaction to one of them. Caused swelling of face and eyes  . Doxycycline Swelling    Swelling of face and eyes  . Fosinopril Sodium Swelling     swelling face,lips-angioedema from ACE I  . Hydrochlorothiazide W-Triamterene Swelling    Pt was recently given a triamterene hydrochlorothiazide combination as well as clonazepam and had an allergic reaction to one of them. Swelling of face and eyes  . Penicillins Swelling    Face and eyes  . Sulfonamide Derivatives Other (See Comments)    Unknown from childhood   Follow-up Information   Follow up with [facility] In 1 week.   Contact information:   [address] [place] [address] [phone]       The results of significant diagnostics from this hospitalization (including imaging, microbiology, ancillary and laboratory) are listed below for reference.    Significant Diagnostic Studies: Ct Head (brain) Wo Contrast  12/08/2013   CLINICAL DATA:  Code stroke.  Altered mental status.  EXAM: CT HEAD WITHOUT CONTRAST  TECHNIQUE: Contiguous axial images were obtained from the base of the skull through the vertex without intravenous contrast.  COMPARISON:  CT of the head performed 03/07/2009  FINDINGS: There is no evidence of acute infarction, mass lesion, or intra- or extra-axial hemorrhage on CT.  Diffuse periventricular and subcortical white matter change is thought to reflect small vessel ischemic microangiopathy; this is mildly worsened at the left frontal lobe, without definite evidence of an acute infarct.  The posterior fossa, including the cerebellum, brainstem and fourth ventricle, is within normal limits. The third and lateral ventricles, and basal ganglia are unremarkable in appearance. The cerebral hemispheres are symmetric in appearance, with normal gray-white differentiation. No mass effect or midline shift is seen.  There is no evidence of fracture; visualized osseous structures are unremarkable in appearance. The orbits are within normal limits. The paranasal sinuses and mastoid air cells are well-aerated. No significant soft tissue abnormalities are seen.  IMPRESSION: 1. No acute intracranial pathology seen on CT. 2. Diffuse small vessel ischemic microangiopathy noted; this is mildly worsened at the left frontal lobe in comparison to 2010, without definite evidence of an acute infarct.  These results were called by telephone at the time of interpretation on 12/08/2013 at 9:55 PM to Dr. Roseanne Reno, who verbally acknowledged these results.   Electronically Signed   By: Roanna Raider M.D.   On: 12/08/2013 21:56   Mr Brain Wo  Contrast  12/09/2013   CLINICAL DATA:  Altered mental status  EXAM: MRI HEAD WITHOUT CONTRAST  TECHNIQUE: Multiplanar, multiecho pulse sequences of the brain and surrounding structures were obtained without intravenous contrast.  COMPARISON:  CT head 12/08/2013  FINDINGS: Negative for acute infarct.  Ventricle size is normal. Cerebral volume is normal. Patchy hyperintensity in the cerebral white matter bilaterally most consistent with chronic microvascular ischemia. Mild hyperintensity in the pons also suggestive of chronic microvascular ischemia.  Negative for mass or edema. Focus of microhemorrhage left parietal lobe which appears chronic. No fluid collection or shift of the midline structures.  Paranasal sinuses are clear.  IMPRESSION: Chronic microvascular ischemia.  No acute abnormality.   Electronically Signed   By: Marlan Palau M.D.   On: 12/09/2013 10:16   Dg Chest Portable 1 View  12/09/2013   CLINICAL DATA:  Altered mental status.  EXAM: PORTABLE CHEST - 1 VIEW  COMPARISON:  DG CHEST 2 VIEW dated 04/05/2012  FINDINGS: Cardiomediastinal silhouette is unremarkable. Diffuse interstitial prominence, with central pulmonary vasculature congestion, increased. Strandy densities in lung bases. No pleural effusions. No pneumothorax.  Multiple EKG lines overlie the patient and may obscure subtle underlying pathology. Punctate calcification in the left neck may be vascular. Multiple EKG lines overlie the patient and may obscure subtle underlying pathology.  IMPRESSION: Increasing interstitial prominence, which could reflect pulmonary edema. Bibasilar atelectasis.   Electronically Signed   By: Awilda Metroourtnay  Bloomer   On: 12/09/2013 00:13    Microbiology: No results found for this or any previous visit (from the past 240 hour(s)).   Labs: Basic Metabolic Panel:  Recent Labs Lab 12/08/13 2139 12/08/13 2207 12/09/13 0504  NA 133* 136* 137  K 3.4* 3.2* 3.5*  CL 93* 96 98  CO2 25  --  28  GLUCOSE 120*  121* 93  BUN 11 11 11   CREATININE 1.07 1.30* 0.98  CALCIUM 9.3  --  9.4   Liver Function Tests:  Recent Labs Lab 12/08/13 2139 12/09/13 0504  AST 22 19  ALT 13 12  ALKPHOS 78 73  BILITOT <0.2* <0.2*  PROT 7.5 7.1  ALBUMIN 3.4* 3.3*   No results found for this basename: LIPASE, AMYLASE,  in the last 168 hours No results found for this basename: AMMONIA,  in the last 168 hours CBC:  Recent Labs Lab 12/08/13 2139 12/08/13 2207 12/09/13 0504  WBC 7.2  --  6.1  NEUTROABS 1.8  --   --   HGB 13.5 15.0 13.3  HCT 38.9 44.0 38.4  MCV 93.1  --  93.4  PLT 213  --  207   Cardiac Enzymes: No results found for this basename: CKTOTAL, CKMB, CKMBINDEX, TROPONINI,  in the last 168 hours BNP: BNP (last 3 results) No results found for this basename: PROBNP,  in the last 8760 hours CBG:  Recent Labs Lab 12/08/13 2215 12/11/13 0707  GLUCAP 97 102*       Signed:  Saabir Blyth  [facility] 12/12/2013, 8:19 AM

## 2013-12-12 NOTE — Clinical Social Work Psych Note (Signed)
Psychiatrist evaluated pt on 12/11/2013 Cleotis Nipper(Syed T Arfeen, MD) and recommended "Medication management, continue current medication. Patient does not need inpatient psychiatric services at this time. [facility]."  Psych CSW reviewed dc follow-up to find [facility] placed on the dc summary for pt to follow up with.  MD Arfeen reviewed access to services with the pt.  Though no other psych needs at this time, Psych CSW remains available for assistance.  Unit CSW made aware.  Vickii PennaGina Holger Sokolowski, LCSWA [phone]  Clinical Social Work

## 2014-02-11 ENCOUNTER — Emergency Department (INDEPENDENT_AMBULATORY_CARE_PROVIDER_SITE_OTHER)
Admission: EM | Admit: 2014-02-11 | Discharge: 2014-02-11 | Disposition: A | Discharge status: Home / Self Care | Payer: Medicare Other | Source: Home / Self Care

## 2014-02-11 ENCOUNTER — Other Ambulatory Visit (HOSPITAL_COMMUNITY)
Admission: RE | Admit: 2014-02-11 | Discharge: 2014-02-11 | Disposition: A | Discharge status: Home / Self Care | Payer: Medicare Other | Source: Ambulatory Visit | Attending: Emergency Medicine | Admitting: Emergency Medicine

## 2014-02-11 ENCOUNTER — Encounter (HOSPITAL_COMMUNITY): Payer: Self-pay | Admitting: Emergency Medicine

## 2014-02-11 DIAGNOSIS — N760 Acute vaginitis: Secondary | ICD-10-CM | POA: Insufficient documentation

## 2014-02-11 DIAGNOSIS — N898 Other specified noninflammatory disorders of vagina: Secondary | ICD-10-CM

## 2014-02-11 DIAGNOSIS — Z113 Encounter for screening for infections with a predominantly sexual mode of transmission: Secondary | ICD-10-CM | POA: Insufficient documentation

## 2014-02-11 DIAGNOSIS — N949 Unspecified condition associated with female genital organs and menstrual cycle: Secondary | ICD-10-CM

## 2014-02-11 DIAGNOSIS — B9689 Other specified bacterial agents as the cause of diseases classified elsewhere: Secondary | ICD-10-CM

## 2014-02-11 DIAGNOSIS — A499 Bacterial infection, unspecified: Secondary | ICD-10-CM

## 2014-02-11 DIAGNOSIS — R102 Pelvic and perineal pain: Secondary | ICD-10-CM

## 2014-02-11 LAB — POCT URINALYSIS DIP (DEVICE)
Bilirubin Urine: NEGATIVE
Glucose, UA: NEGATIVE mg/dL
Ketones, ur: NEGATIVE mg/dL
Nitrite: NEGATIVE
Protein, ur: NEGATIVE mg/dL
Specific Gravity, Urine: <=1.005 (ref 1.005–1.030)
Urobilinogen, UA: 0.2 mg/dL (ref 0.0–1.0)
pH: 5.5 (ref 5.0–8.0)

## 2014-02-11 MED ORDER — METRONIDAZOLE 500 MG PO TABS: 500.0000 mg | ORAL_TABLET | Freq: Two times a day (BID) | ORAL | Status: DC

## 2014-02-11 NOTE — Discharge Instructions (Signed)
Abdominal Pain, Women °Abdominal (stomach, pelvic, or belly) pain can be caused by many things. It is important to tell your doctor: °· The location of the pain. °· Does it come and go or is it present all the time? °· Are there things that start the pain (eating certain foods, exercise)? °· Are there other symptoms associated with the pain (fever, nausea, vomiting, diarrhea)? °All of this is helpful to know when trying to find the cause of the pain. °CAUSES  °· Stomach: virus or bacteria infection, or ulcer. °· Intestine: appendicitis (inflamed appendix), regional ileitis (Crohn's disease), ulcerative colitis (inflamed colon), irritable bowel syndrome, diverticulitis (inflamed diverticulum of the colon), or cancer of the stomach or intestine. °· Gallbladder disease or stones in the gallbladder. °· Kidney disease, kidney stones, or infection. °· Pancreas infection or cancer. °· Fibromyalgia (pain disorder). °· Diseases of the female organs: °· Uterus: fibroid (non-cancerous) tumors or infection. °· Fallopian tubes: infection or tubal pregnancy. °· Ovary: cysts or tumors. °· Pelvic adhesions (scar tissue). °· Endometriosis (uterus lining tissue growing in the pelvis and on the pelvic organs). °· Pelvic congestion syndrome (female organs filling up with blood just before the menstrual period). °· Pain with the menstrual period. °· Pain with ovulation (producing an egg). °· Pain with an IUD (intrauterine device, birth control) in the uterus. °· Cancer of the female organs. °· Functional pain (pain not caused by a disease, may improve without treatment). °· Psychological pain. °· Depression. °DIAGNOSIS  °Your doctor will decide the seriousness of your pain by doing an examination. °· Blood tests. °· X-rays. °· Ultrasound. °· CT scan (computed tomography, special type of X-ray). °· MRI (magnetic resonance imaging). °· Cultures, for infection. °· Barium enema (dye inserted in the large intestine, to better view it with  X-rays). °· Colonoscopy (looking in intestine with a lighted tube). °· Laparoscopy (minor surgery, looking in abdomen with a lighted tube). °· Major abdominal exploratory surgery (looking in abdomen with a large incision). °TREATMENT  °The treatment will depend on the cause of the pain.  °· Many cases can be observed and treated at home. °· Over-the-counter medicines recommended by your caregiver. °· Prescription medicine. °· Antibiotics, for infection. °· Birth control pills, for painful periods or for ovulation pain. °· Hormone treatment, for endometriosis. °· Nerve blocking injections. °· Physical therapy. °· Antidepressants. °· Counseling with a psychologist or psychiatrist. °· Minor or major surgery. °HOME CARE INSTRUCTIONS  °· Do not take laxatives, unless directed by your caregiver. °· Take over-the-counter pain medicine only if ordered by your caregiver. Do not take aspirin because it can cause an upset stomach or bleeding. °· Try a clear liquid diet (broth or water) as ordered by your caregiver. Slowly move to a bland diet, as tolerated, if the pain is related to the stomach or intestine. °· Have a thermometer and take your temperature several times a day, and record it. °· Bed rest and sleep, if it helps the pain. °· Avoid sexual intercourse, if it causes pain. °· Avoid stressful situations. °· Keep your follow-up appointments and tests, as your caregiver orders. °· If the pain does not go away with medicine or surgery, you may try: °· Acupuncture. °· Relaxation exercises (yoga, meditation). °· Group therapy. °· Counseling. °SEEK MEDICAL CARE IF:  °· You notice certain foods cause stomach pain. °· Your home care treatment is not helping your pain. °· You need stronger pain medicine. °· You want your IUD removed. °· You feel faint or   lightheaded.  You develop nausea and vomiting.  You develop a rash.  You are having side effects or an allergy to your medicine. SEEK IMMEDIATE MEDICAL CARE IF:   Your  pain does not go away or gets worse.  You have a fever.  Your pain is felt only in portions of the abdomen. The right side could possibly be appendicitis. The left lower portion of the abdomen could be colitis or diverticulitis.  You are passing blood in your stools (bright red or black tarry stools, with or without vomiting).  You have blood in your urine.  You develop chills, with or without a fever.  You pass out. MAKE SURE YOU:   Understand these instructions.  Will watch your condition.  Will get help right away if you are not doing well or get worse. Document Released: 08/01/2007 Document Revised: 12/27/2011 Document Reviewed: 08/21/2009 [facility] Patient Information 2014 [place], [place].  Bacterial Vaginosis Bacterial vaginosis is an infection of the vagina. It happens when too many of certain germs (bacteria) grow in the vagina. HOME CARE  Take your medicine as told by your doctor.  Finish your medicine even if you start to feel better.  Do not have sex until you finish your medicine and are better.  Tell your sex partner that you have an infection. They should see their doctor for treatment.  Practice safe sex. Use condoms. Have only one sex partner. GET HELP IF:  You are not getting better after 3 days of treatment.  You have more grey fluid (discharge) coming from your vagina than before.  You have more pain than before.  You have a fever. MAKE SURE YOU:   Understand these instructions.  Will watch your condition.  Will get help right away if you are not doing well or get worse. Document Released: 07/13/2008 Document Revised: 07/25/2013 Document Reviewed: 05/16/2013 [facility] Patient Information 2014 [place], [place].  Vaginitis Vaginitis is an inflammation of the vagina. It is most often caused by a change in the normal balance of the bacteria and yeast that live in the vagina. This change in balance causes an overgrowth of certain bacteria or yeast,  which causes the inflammation. There are different types of vaginitis, but the most common types are:  Bacterial vaginosis.  Yeast infection (candidiasis).  Trichomoniasis vaginitis. This is a sexually transmitted infection (STI).  Viral vaginitis.  Atropic vaginitis.  Allergic vaginitis. CAUSES  The cause depends on the type of vaginitis. Vaginitis can be caused by:  Bacteria (bacterial vaginosis).  Yeast (yeast infection).  A parasite (trichomoniasis vaginitis)  A virus (viral vaginitis).  Low hormone levels (atrophic vaginitis). Low hormone levels can occur during pregnancy, breastfeeding, or after menopause.  Irritants, such as bubble baths, scented tampons, and feminine sprays (allergic vaginitis). Other factors can change the normal balance of the yeast and bacteria that live in the vagina. These include:  Antibiotic medicines.  Poor hygiene.  Diaphragms, vaginal sponges, spermicides, birth control pills, and intrauterine devices (IUD).  Sexual intercourse.  Infection.  Uncontrolled diabetes.  A weakened immune system. SYMPTOMS  Symptoms can vary depending on the cause of the vaginitis. Common symptoms include:  Abnormal vaginal discharge.  The discharge is white, gray, or yellow with bacterial vaginosis.  The discharge is thick, white, and cheesy with a yeast infection.  The discharge is frothy and yellow or greenish with trichomoniasis.  A bad vaginal odor.  The odor is fishy with bacterial vaginosis.  Vaginal itching, pain, or swelling.  Painful intercourse.  Pain or burning when urinating. Sometimes, there are no symptoms. TREATMENT  Treatment will vary depending on the type of infection.   Bacterial vaginosis and trichomoniasis are often treated with antibiotic creams or pills.  Yeast infections are often treated with antifungal medicines, such as vaginal creams or suppositories.  Viral vaginitis has no cure, but symptoms can be treated  with medicines that relieve discomfort. Your sexual partner should be treated as well.  Atrophic vaginitis may be treated with an estrogen cream, pill, suppository, or vaginal ring. If vaginal dryness occurs, lubricants and moisturizing creams may help. You may be told to avoid scented soaps, sprays, or douches.  Allergic vaginitis treatment involves quitting the use of the product that is causing the problem. Vaginal creams can be used to treat the symptoms. HOME CARE INSTRUCTIONS   Take all medicines as directed by your caregiver.  Keep your genital area clean and dry. Avoid soap and only rinse the area with water.  Avoid douching. It can remove the healthy bacteria in the vagina.  Do not use tampons or have sexual intercourse until your vaginitis has been treated. Use sanitary pads while you have vaginitis.  Wipe from front to back. This avoids the spread of bacteria from the rectum to the vagina.  Let air reach your genital area.  Wear cotton underwear to decrease moisture buildup.  Avoid wearing underwear while you sleep until your vaginitis is gone.  Avoid tight pants and underwear or nylons without a cotton panel.  Take off wet clothing (especially bathing suits) as soon as possible.  Use mild, non-scented products. Avoid using irritants, such as:  Scented feminine sprays.  Fabric softeners.  Scented detergents.  Scented tampons.  Scented soaps or bubble baths.  Practice safe sex and use condoms. Condoms may prevent the spread of trichomoniasis and viral vaginitis. SEEK MEDICAL CARE IF:   You have abdominal pain.  You have a fever or persistent symptoms for more than 2 3 days.  You have a fever and your symptoms suddenly get worse. Document Released: 08/01/2007 Document Revised: 06/28/2012 Document Reviewed: 03/16/2012 [facility] Patient Information 2014 [place]Care, [place].

## 2014-02-11 NOTE — ED Notes (Signed)
C/o  Yellow vaginal discharge with odor x 1 wk.   Lower pelvic discomfort.  No relief with otc meds.  Has concerns for std's.

## 2014-02-11 NOTE — ED Provider Notes (Signed)
CSN: [id number]     Arrival date & time 02/11/14  1139 History   First MD Initiated Contact with Patient 02/11/14 1350     Chief Complaint  Patient presents with  . Vaginal Discharge  . Exposure to STD   (Consider location/radiation/quality/duration/timing/severity/associated sxs/prior Treatment) Patient is a 63 y.o. female presenting with vaginal discharge and STD exposure. The history is provided by the patient.  Vaginal Discharge Quality:  Yellow, thin and malodorous Severity:  Moderate Onset quality:  Gradual Duration:  1 week Timing:  Constant Progression:  Worsening Chronicity:  New Context: spontaneously   Context: not genital trauma and not recent antibiotic use   Relieved by:  Nothing Ineffective treatments:  Ibuprofen Associated symptoms: abdominal pain   Associated symptoms: no dysuria, no fever, no nausea, no rash, no urinary frequency and no vomiting   Risk factors: no foreign body and no prior miscarriage   Exposure to STD Associated symptoms include abdominal pain.    Past Medical History  Diagnosis Date  . Hypertension   . Asthma   . Depression   . Bipolar affective disorder, currently depressed, mild   . Stroke   . Coronary artery disease   . Tardive dyskinesia   . Fall 08/25/2012    Recent fall with residual knee and back pain.   Past Surgical History  Procedure Laterality Date  . Abdominal hysterectomy    . Cardiac catheterization     Family History  Problem Relation Age of Onset  . Hypertension Mother    History  Substance Use Topics  . Smoking status: Current Every Day Smoker    Types: Cigarettes    Last Attempt to Quit: 07/13/2012  . Smokeless tobacco: Not on file  . Alcohol Use: Yes     Comment: pt reports once on last Saturday   OB History   Grav Para Term Preterm Abortions TAB SAB Ect Mult Living                 Review of Systems  Constitutional: Negative for fever.  Gastrointestinal: Positive for abdominal pain. Negative for  nausea and vomiting.  Genitourinary: Positive for vaginal discharge. Negative for dysuria.  All other systems reviewed and are negative.   Allergies  Clonazepam; Doxycycline; Fosinopril sodium; Hydrochlorothiazide w-triamterene; Penicillins; and Sulfonamide derivatives  Home Medications   Prior to Admission medications   Medication Sig Start Date End Date Taking? Authorizing Provider  ALPRAZolam (XANAX) 0.25 MG tablet Take 1 tablet (0.25 mg total) by mouth at bedtime as needed for anxiety. 12/12/13   Joseph ArtJessica U Vann, DO  benztropine (COGENTIN) 1 MG tablet Take 1 mg by mouth 2 (two) times daily.    Historical Provider, MD  citalopram (CELEXA) 40 MG tablet Take 40 mg by mouth daily.    Historical Provider, MD  gabapentin (NEURONTIN) 300 MG capsule Take 300 mg by mouth 3 (three) times daily.    Historical Provider, MD  ondansetron (ZOFRAN) 4 MG tablet Take 1 tablet (4 mg total) by mouth every 6 (six) hours. 10/06/13   Shari A Upstill, PA-C  risperiDONE (RISPERDAL) 1 MG tablet Take 1 mg by mouth 2 (two) times daily.    Historical Provider, MD  traMADol (ULTRAM) 50 MG tablet Take 1 tablet (50 mg total) by mouth every 12 (twelve) hours as needed for moderate pain. 12/12/13   Joseph ArtJessica U Vann, DO  traZODone (DESYREL) 100 MG tablet Take 200 mg by mouth at bedtime.    Historical Provider, MD   BP 137/84  Pulse 89  Temp(Src) 97.6 F (36.4 C) (Oral)  Resp 16  SpO2 98% Physical Exam  Nursing note and vitals reviewed. Constitutional: She is oriented to person, place, and time. She appears well-developed and well-nourished. No distress.  Neck: Normal range of motion. Neck supple.  Cardiovascular: Normal rate and regular rhythm.   Pulmonary/Chest: Effort normal. No respiratory distress.  Abdominal: Soft. She exhibits no distension and no mass. There is no rebound and no guarding.  Tenderness R pelvis and lower most RLQ  Genitourinary: Vaginal discharge found.  NEFG Moderate amt of yellow, thin  vaginal discharge. Vag cuff intact with prior hysterectomy. No bleeding. Bimanual demonstrates R pelvic/adnexal tenderness; no RLQ tenderness.  Neurological: She is alert and oriented to person, place, and time.  Skin: Skin is warm and dry.  Psychiatric: She has a normal mood and affect.    ED Course  Procedures (including critical care time) Labs Review Labs Reviewed  POCT URINALYSIS DIP (DEVICE) - Abnormal; Notable for the following:    Hgb urine dipstick TRACE (*)    Leukocytes, UA LARGE (*)    All other components within normal limits  CERVICOVAGINAL ANCILLARY ONLY    Imaging Review No results found.   MDM   1. Vaginal discharge   2. Pain, pelvic, female   3. BV (bacterial vaginosis)     Per caretaker not on medications except for BP med which is not listed. Flagyl bid Cytology pending Lots of water For worsening pain or fever, other problems return.    Hayden Rasmussenavid Lanee Chain, NP 02/11/14 1414

## 2014-02-11 NOTE — ED Provider Notes (Signed)
Medical screening examination/treatment/procedure(s) were performed by non-physician practitioner and as supervising physician I was immediately available for consultation/collaboration.  Tayden Duran, M.D.  Adara Kittle C Nimrod Wendt, MD 02/11/14 1714 

## 2014-02-12 LAB — CERVICOVAGINAL ANCILLARY ONLY
Chlamydia: NEGATIVE
Neisseria Gonorrhea: NEGATIVE
Wet Prep (BD Affirm): NEGATIVE
Wet Prep (BD Affirm): POSITIVE — AB
Wet Prep (BD Affirm): POSITIVE — AB

## 2014-02-13 NOTE — ED Notes (Signed)
GC/Chlamydia neg., Affirm: Candida and Trich pos., Gardnerella neg.  Pt. adequately treated with Flagyl for Trich.  Message to Hayden Rasmussenavid Mabe NP. [facility]uz[facility] 02/13/2014

## 2014-02-15 ENCOUNTER — Telehealth (HOSPITAL_COMMUNITY): Payer: Self-pay | Admitting: *Deleted

## 2014-02-15 MED ORDER — FLUCONAZOLE 150 MG PO TABS: ORAL_TABLET | ORAL | Status: DC

## 2014-02-15 NOTE — ED Notes (Addendum)
Danielle Rasmussenavid Mabe NP e-prescribed Diflucan to pt.'s pharmacy.  I called and left a message with pt.'s daughter for pt. to call me.  Call 1. Danielle MoselleSuzanne M Annaly [name] 02/15/2014 Danielle ConnorsMichael [name] -pt.'s contact called back and said she is in a treatment center. He said he would pick up the Rx. and take it to her tomorrow. Danielle LucySuzanne M Gwynneth [name] 02/15/2014

## 2014-05-23 ENCOUNTER — Emergency Department (HOSPITAL_COMMUNITY): Payer: Medicare Other

## 2014-05-23 ENCOUNTER — Inpatient Hospital Stay (HOSPITAL_COMMUNITY)
Admission: EM | Admit: 2014-05-23 | Discharge: 2014-05-27 | DRG: 917 | Disposition: A | Discharge status: Home / Self Care | Payer: Medicare Other | Attending: Internal Medicine | Admitting: Internal Medicine

## 2014-05-23 ENCOUNTER — Encounter (HOSPITAL_COMMUNITY): Payer: Self-pay | Admitting: Emergency Medicine

## 2014-05-23 DIAGNOSIS — F3289 Other specified depressive episodes: Secondary | ICD-10-CM

## 2014-05-23 DIAGNOSIS — F1910 Other psychoactive substance abuse, uncomplicated: Secondary | ICD-10-CM | POA: Diagnosis present

## 2014-05-23 DIAGNOSIS — G929 Unspecified toxic encephalopathy: Secondary | ICD-10-CM | POA: Diagnosis present

## 2014-05-23 DIAGNOSIS — F329 Major depressive disorder, single episode, unspecified: Secondary | ICD-10-CM

## 2014-05-23 DIAGNOSIS — G2401 Drug induced subacute dyskinesia: Secondary | ICD-10-CM | POA: Diagnosis present

## 2014-05-23 DIAGNOSIS — F17200 Nicotine dependence, unspecified, uncomplicated: Secondary | ICD-10-CM | POA: Diagnosis present

## 2014-05-23 DIAGNOSIS — T43591A Poisoning by other antipsychotics and neuroleptics, accidental (unintentional), initial encounter: Secondary | ICD-10-CM | POA: Diagnosis present

## 2014-05-23 DIAGNOSIS — J45909 Unspecified asthma, uncomplicated: Secondary | ICD-10-CM | POA: Diagnosis present

## 2014-05-23 DIAGNOSIS — F3130 Bipolar disorder, current episode depressed, mild or moderate severity, unspecified: Secondary | ICD-10-CM | POA: Diagnosis present

## 2014-05-23 DIAGNOSIS — R413 Other amnesia: Secondary | ICD-10-CM | POA: Diagnosis present

## 2014-05-23 DIAGNOSIS — G92 Toxic encephalopathy: Secondary | ICD-10-CM | POA: Diagnosis present

## 2014-05-23 DIAGNOSIS — R252 Cramp and spasm: Secondary | ICD-10-CM | POA: Diagnosis not present

## 2014-05-23 DIAGNOSIS — Z8673 Personal history of transient ischemic attack (TIA), and cerebral infarction without residual deficits: Secondary | ICD-10-CM

## 2014-05-23 DIAGNOSIS — F1994 Other psychoactive substance use, unspecified with psychoactive substance-induced mood disorder: Secondary | ICD-10-CM

## 2014-05-23 DIAGNOSIS — R4182 Altered mental status, unspecified: Secondary | ICD-10-CM | POA: Diagnosis present

## 2014-05-23 DIAGNOSIS — G9340 Encephalopathy, unspecified: Secondary | ICD-10-CM | POA: Diagnosis present

## 2014-05-23 DIAGNOSIS — Z79899 Other long term (current) drug therapy: Secondary | ICD-10-CM

## 2014-05-23 DIAGNOSIS — I2510 Atherosclerotic heart disease of native coronary artery without angina pectoris: Secondary | ICD-10-CM | POA: Diagnosis present

## 2014-05-23 DIAGNOSIS — T424X4A Poisoning by benzodiazepines, undetermined, initial encounter: Principal | ICD-10-CM | POA: Diagnosis present

## 2014-05-23 DIAGNOSIS — F200 Paranoid schizophrenia: Secondary | ICD-10-CM | POA: Diagnosis present

## 2014-05-23 DIAGNOSIS — I10 Essential (primary) hypertension: Secondary | ICD-10-CM | POA: Diagnosis present

## 2014-05-23 HISTORY — DX: Other complications of anesthesia, initial encounter: T88.59XA

## 2014-05-23 HISTORY — DX: Adverse effect of unspecified anesthetic, initial encounter: T41.45XA

## 2014-05-23 HISTORY — DX: Family history of other specified conditions: Z84.89

## 2014-05-23 LAB — CBG MONITORING, ED: Glucose-Capillary: 111 mg/dL — ABNORMAL HIGH (ref 70–99)

## 2014-05-23 LAB — CBC WITH DIFFERENTIAL/PLATELET
Basophils Absolute: 0 10*3/uL (ref 0.0–0.1)
Basophils Relative: 0 % (ref 0–1)
Eosinophils Absolute: 0.1 10*3/uL (ref 0.0–0.7)
Eosinophils Relative: 1 % (ref 0–5)
HCT: 41.7 % (ref 36.0–46.0)
Hemoglobin: 14.3 g/dL (ref 12.0–15.0)
Lymphocytes Relative: 40 % (ref 12–46)
Lymphs Abs: 3.9 10*3/uL (ref 0.7–4.0)
MCH: 31.5 pg (ref 26.0–34.0)
MCHC: 34.3 g/dL (ref 30.0–36.0)
MCV: 91.9 fL (ref 78.0–100.0)
Monocytes Absolute: 1 10*3/uL (ref 0.1–1.0)
Monocytes Relative: 10 % (ref 3–12)
Neutro Abs: 4.8 10*3/uL (ref 1.7–7.7)
Neutrophils Relative %: 49 % (ref 43–77)
Platelets: 243 10*3/uL (ref 150–400)
RBC: 4.54 MIL/uL (ref 3.87–5.11)
RDW: 13 % (ref 11.5–15.5)
WBC: 9.8 10*3/uL (ref 4.0–10.5)

## 2014-05-23 LAB — I-STAT ARTERIAL BLOOD GAS, ED
Acid-Base Excess: 4 mmol/L — ABNORMAL HIGH (ref 0.0–2.0)
Bicarbonate: 27.7 mEq/L — ABNORMAL HIGH (ref 20.0–24.0)
O2 Saturation: 93 %
Patient temperature: 98.6
TCO2: 29 mmol/L (ref 0–100)
pCO2 arterial: 39.5 mmHg (ref 35.0–45.0)
pH, Arterial: 7.454 — ABNORMAL HIGH (ref 7.350–7.450)
pO2, Arterial: 65 mmHg — ABNORMAL LOW (ref 80.0–100.0)

## 2014-05-23 LAB — RAPID URINE DRUG SCREEN, HOSP PERFORMED
Amphetamines: NOT DETECTED
Barbiturates: NOT DETECTED
Benzodiazepines: POSITIVE — AB
Cocaine: NOT DETECTED
Opiates: NOT DETECTED
Tetrahydrocannabinol: NOT DETECTED

## 2014-05-23 LAB — HEPATIC FUNCTION PANEL
ALT: 18 U/L (ref 0–35)
AST: 40 U/L — ABNORMAL HIGH (ref 0–37)
Albumin: 3.7 g/dL (ref 3.5–5.2)
Alkaline Phosphatase: 85 U/L (ref 39–117)
Bilirubin, Direct: <0.2 mg/dL (ref 0.0–0.3)
Total Bilirubin: <0.2 mg/dL — ABNORMAL LOW (ref 0.3–1.2)
Total Protein: 7.8 g/dL (ref 6.0–8.3)

## 2014-05-23 LAB — I-STAT CHEM 8, ED
BUN: 11 mg/dL (ref 6–23)
Calcium, Ion: 1.18 mmol/L (ref 1.13–1.30)
Chloride: 101 mEq/L (ref 96–112)
Creatinine, Ser: 0.9 mg/dL (ref 0.50–1.10)
Glucose, Bld: 106 mg/dL — ABNORMAL HIGH (ref 70–99)
HCT: 47 % — ABNORMAL HIGH (ref 36.0–46.0)
Hemoglobin: 16 g/dL — ABNORMAL HIGH (ref 12.0–15.0)
Potassium: 3.1 mEq/L — ABNORMAL LOW (ref 3.7–5.3)
Sodium: 142 mEq/L (ref 137–147)
TCO2: 25 mmol/L (ref 0–100)

## 2014-05-23 LAB — URINALYSIS, ROUTINE W REFLEX MICROSCOPIC
Bilirubin Urine: NEGATIVE
Glucose, UA: NEGATIVE mg/dL
Hgb urine dipstick: NEGATIVE
Ketones, ur: NEGATIVE mg/dL
Leukocytes, UA: NEGATIVE
Nitrite: NEGATIVE
Protein, ur: 100 mg/dL — AB
Specific Gravity, Urine: 1.02 (ref 1.005–1.030)
Urobilinogen, UA: 0.2 mg/dL (ref 0.0–1.0)
pH: 7.5 (ref 5.0–8.0)

## 2014-05-23 LAB — PROTIME-INR
INR: 1.08 (ref 0.00–1.49)
Prothrombin Time: 14 seconds (ref 11.6–15.2)

## 2014-05-23 LAB — BASIC METABOLIC PANEL
Anion gap: 15 (ref 5–15)
BUN: 11 mg/dL (ref 6–23)
CO2: 26 mEq/L (ref 19–32)
Calcium: 9.6 mg/dL (ref 8.4–10.5)
Chloride: 102 mEq/L (ref 96–112)
Creatinine, Ser: 0.79 mg/dL (ref 0.50–1.10)
GFR calc Af Amer: >90 mL/min (ref >90)
GFR calc non Af Amer: 88 mL/min — ABNORMAL LOW (ref >90)
Glucose, Bld: 102 mg/dL — ABNORMAL HIGH (ref 70–99)
Potassium: 3.5 mEq/L — ABNORMAL LOW (ref 3.7–5.3)
Sodium: 143 mEq/L (ref 137–147)

## 2014-05-23 LAB — APTT: aPTT: 29 seconds (ref 24–37)

## 2014-05-23 LAB — URINE MICROSCOPIC-ADD ON

## 2014-05-23 LAB — ETHANOL: Alcohol, Ethyl (B): <11 mg/dL (ref 0–11)

## 2014-05-23 MED ORDER — ENOXAPARIN SODIUM 40 MG/0.4ML ~~LOC~~ SOLN
40.0000 mg | SUBCUTANEOUS | Status: DC
  Administered 2014-05-23 – 2014-05-25 (×3): 40 mg via SUBCUTANEOUS
  Filled 2014-05-23 (×5): qty 0.4

## 2014-05-23 MED ORDER — RISPERIDONE 1 MG PO TABS
1.0000 mg | ORAL_TABLET | Freq: Two times a day (BID) | ORAL | Status: DC
  Administered 2014-05-23 – 2014-05-27 (×8): 1 mg via ORAL
  Filled 2014-05-23 (×9): qty 1

## 2014-05-23 MED ORDER — ONDANSETRON HCL 4 MG/2ML IJ SOLN: 4.0000 mg | Freq: Four times a day (QID) | INTRAMUSCULAR | Status: DC | PRN

## 2014-05-23 MED ORDER — SODIUM CHLORIDE 0.9 % IV SOLN
INTRAVENOUS | Status: DC
  Administered 2014-05-23 – 2014-05-27 (×5): via INTRAVENOUS

## 2014-05-23 MED ORDER — ONDANSETRON HCL 4 MG PO TABS: 4.0000 mg | ORAL_TABLET | Freq: Four times a day (QID) | ORAL | Status: DC | PRN

## 2014-05-23 MED ORDER — BENZTROPINE MESYLATE 1 MG PO TABS
1.0000 mg | ORAL_TABLET | Freq: Two times a day (BID) | ORAL | Status: DC
  Administered 2014-05-23 – 2014-05-27 (×8): 1 mg via ORAL
  Filled 2014-05-23 (×9): qty 1

## 2014-05-23 MED ORDER — NALOXONE HCL 0.4 MG/ML IJ SOLN
0.4000 mg | Freq: Once | INTRAMUSCULAR | Status: AC
  Administered 2014-05-23: 0.4 mg via INTRAVENOUS
  Filled 2014-05-23: qty 1

## 2014-05-23 MED ORDER — SODIUM CHLORIDE 0.9 % IV BOLUS (SEPSIS)
1000.0000 mL | Freq: Once | INTRAVENOUS | Status: AC
  Administered 2014-05-23: 1000 mL via INTRAVENOUS

## 2014-05-23 MED ORDER — ACETAMINOPHEN 325 MG PO TABS
650.0000 mg | ORAL_TABLET | Freq: Four times a day (QID) | ORAL | Status: DC | PRN
  Administered 2014-05-24 – 2014-05-25 (×2): 650 mg via ORAL
  Filled 2014-05-23 (×3): qty 2

## 2014-05-23 MED ORDER — GABAPENTIN 300 MG PO CAPS
300.0000 mg | ORAL_CAPSULE | Freq: Three times a day (TID) | ORAL | Status: DC
  Administered 2014-05-23 – 2014-05-27 (×10): 300 mg via ORAL
  Filled 2014-05-23 (×13): qty 1

## 2014-05-23 MED ORDER — ACETAMINOPHEN 650 MG RE SUPP: 650.0000 mg | Freq: Four times a day (QID) | RECTAL | Status: DC | PRN

## 2014-05-23 MED ORDER — POTASSIUM CHLORIDE 10 MEQ/100ML IV SOLN
10.0000 meq | INTRAVENOUS | Status: AC
  Administered 2014-05-23 – 2014-05-24 (×3): 10 meq via INTRAVENOUS
  Filled 2014-05-23 (×2): qty 100

## 2014-05-23 MED ORDER — TRAZODONE HCL 100 MG PO TABS
200.0000 mg | ORAL_TABLET | Freq: Every day | ORAL | Status: DC
  Administered 2014-05-23: 200 mg via ORAL
  Filled 2014-05-23 (×2): qty 2

## 2014-05-23 MED ORDER — POTASSIUM CHLORIDE 10 MEQ/100ML IV SOLN: 10.0000 meq | INTRAVENOUS | Status: DC

## 2014-05-23 MED ORDER — CITALOPRAM HYDROBROMIDE 40 MG PO TABS
40.0000 mg | ORAL_TABLET | Freq: Every day | ORAL | Status: DC
  Administered 2014-05-24 – 2014-05-27 (×4): 40 mg via ORAL
  Filled 2014-05-23 (×4): qty 1

## 2014-05-23 MED ORDER — PANTOPRAZOLE SODIUM 40 MG PO TBEC
40.0000 mg | DELAYED_RELEASE_TABLET | Freq: Every day | ORAL | Status: DC
  Administered 2014-05-24 – 2014-05-27 (×4): 40 mg via ORAL
  Filled 2014-05-23 (×3): qty 1

## 2014-05-23 MED ORDER — POTASSIUM CHLORIDE CRYS ER 20 MEQ PO TBCR
40.0000 meq | EXTENDED_RELEASE_TABLET | Freq: Once | ORAL | Status: AC
  Administered 2014-05-23: 40 meq via ORAL
  Filled 2014-05-23: qty 2

## 2014-05-23 NOTE — ED Provider Notes (Signed)
CSN: [id number]     Arrival date & time 05/23/14  1325 History   First MD Initiated Contact with Patient 05/23/14 1338     Chief Complaint  Patient presents with  . Altered Mental Status     (Consider location/radiation/quality/duration/timing/severity/associated sxs/prior Treatment) HPI  Kyonna Frier is a 63 y.o. female with past medical history significant for schizophrenia, CAD, CVA accompanied by her caseworker from the [facility] presenting with altered mental status. As per the caseworker, patient left the [facility] after she was very upset last night at about midnight. She returned this morning about 10 AM, confused and somnolent with slurred speech. She was walking around without close, thinking that her sheets were pants. As per patient's caseworker: Patient is a former crack cocaine abuser. They're concerned that she relapsed last night. Patient is very upset because she is being discharged into her own housing but I think she is nervous about living independently.  Level V caveat secondary to altered mental status.  Past Medical History  Diagnosis Date  . Hypertension   . Asthma   . Depression   . Bipolar affective disorder, currently depressed, mild   . Stroke   . Coronary artery disease   . Tardive dyskinesia   . Fall 08/25/2012    Recent fall with residual knee and back pain.   Past Surgical History  Procedure Laterality Date  . Abdominal hysterectomy    . Cardiac catheterization     Family History  Problem Relation Age of Onset  . Hypertension Mother    History  Substance Use Topics  . Smoking status: Current Every Day Smoker    Types: Cigarettes    Last Attempt to Quit: 07/13/2012  . Smokeless tobacco: Not on file  . Alcohol Use: Yes     Comment: pt reports once on last Saturday   OB History   Grav Para Term Preterm Abortions TAB SAB Ect Mult Living                 Review of Systems  10 systems reviewed and found to be negative, except  as noted in the HPI.   Allergies  Clonazepam; Doxycycline; Fosinopril sodium; Hydrochlorothiazide w-triamterene; Penicillins; and Sulfonamide derivatives  Home Medications   Prior to Admission medications   Medication Sig Start Date End Date Taking? Authorizing Provider  benztropine (COGENTIN) 1 MG tablet Take 1 mg by mouth 2 (two) times daily.   Yes Historical Provider, MD  citalopram (CELEXA) 40 MG tablet Take 40 mg by mouth daily.   Yes Historical Provider, MD  gabapentin (NEURONTIN) 300 MG capsule Take 300 mg by mouth 3 (three) times daily.   Yes Historical Provider, MD  omeprazole (PRILOSEC) 20 MG capsule Take 20 mg by mouth daily as needed (acid reflux).   Yes Historical Provider, MD  risperiDONE (RISPERDAL) 1 MG tablet Take 1 mg by mouth 2 (two) times daily.   Yes Historical Provider, MD  traZODone (DESYREL) 100 MG tablet Take 200 mg by mouth at bedtime.   Yes Historical Provider, MD   BP 132/89  Pulse 97  Temp(Src) 98.2 F (36.8 C) (Oral)  Resp 18  SpO2 100% Physical Exam  Nursing note and vitals reviewed. Constitutional: She appears well-developed and well-nourished. No distress.  Somnolent, rousable to voice.   HENT:  Head: Normocephalic and atraumatic.  Mouth/Throat: Oropharynx is clear and moist.  Eyes: Conjunctivae and EOM are normal. Pupils are equal, round, and reactive to light.  Neck: Normal range of motion.  Cardiovascular: Normal rate, regular rhythm and intact distal pulses.   Pulmonary/Chest: Effort normal and breath sounds normal. No stridor. No respiratory distress. She has no wheezes. She has no rales. She exhibits no tenderness.  Abdominal: Soft. Bowel sounds are normal. She exhibits no distension and no mass. There is no tenderness. There is no rebound and no guarding.  Musculoskeletal: Normal range of motion. She exhibits no edema.       Back:  No bruising, abrasions or lacerations.  Neurological: She is alert. Coordination normal.  Slurred speech,  oriented to self only knows her birth month and day but not year. Does not know the state, the year or the president.   Strength is 4/5x4 extremities, pupils are equal round and reactive to light and accommodation, sensation is grossly normal to the face in 4 extremities. Extraocular movement is intact, patient can raise her eyebrows and puff out cheeks, shoulder shrug FROM neck  Psychiatric: She has a normal mood and affect.    ED Course  Procedures (including critical care time) Labs Review Labs Reviewed  BASIC METABOLIC PANEL - Abnormal; Notable for the following:    Potassium 3.5 (*)    Glucose, Bld 102 (*)    GFR calc non Af Amer 88 (*)    All other components within normal limits  HEPATIC FUNCTION PANEL - Abnormal; Notable for the following:    AST 40 (*)    Total Bilirubin <0.2 (*)    All other components within normal limits  CBG MONITORING, ED - Abnormal; Notable for the following:    Glucose-Capillary 111 (*)    All other components within normal limits  I-STAT CHEM 8, ED - Abnormal; Notable for the following:    Potassium 3.1 (*)    Glucose, Bld 106 (*)    Hemoglobin 16.0 (*)    HCT 47.0 (*)    All other components within normal limits  I-STAT ARTERIAL BLOOD GAS, ED - Abnormal; Notable for the following:    pH, Arterial 7.454 (*)    pO2, Arterial 65.0 (*)    Bicarbonate 27.7 (*)    Acid-Base Excess 4.0 (*)    All other components within normal limits  CBC WITH DIFFERENTIAL  PROTIME-INR  APTT  URINE RAPID DRUG SCREEN (HOSP PERFORMED)  BLOOD GAS, ARTERIAL  AMMONIA  URINALYSIS, ROUTINE W REFLEX MICROSCOPIC  ETHANOL    Imaging Review Dg Chest 2 View  05/23/2014   CLINICAL DATA:  Altered mental status.  EXAM: CHEST  2 VIEW  COMPARISON:  12/08/2013  FINDINGS: Cardiac silhouette is mildly enlarged. Aorta is mildly uncoiled. No mediastinal or hilar masses. No evidence of adenopathy.  Lungs are clear.  No pleural effusion or pneumothorax.  Bony thorax is demineralized  but intact.  IMPRESSION: No acute cardiopulmonary disease.   Electronically Signed   By: Amie Portland M.D.   On: 05/23/2014 16:06   Ct Head Wo Contrast  05/23/2014   CLINICAL DATA:  Altered mental status  EXAM: CT HEAD WITHOUT CONTRAST  TECHNIQUE: Contiguous axial images were obtained from the base of the skull through the vertex without intravenous contrast.  COMPARISON:  Prior head CT 12/08/2013; prior brain MRI 12/09/2013  FINDINGS: Negative for acute intracranial hemorrhage, acute infarction, mass, mass effect, hydrocephalus or midline shift. Gray-white differentiation is preserved throughout. Periventricular, subcortical and deep white matter hypoattenuation most consistent with the sequelae of longstanding chronic microvascular ischemia. No acute soft tissue or calvarial abnormality. The globes and orbits are symmetric and unremarkable. Normal  aeration of the mastoid air cells and visualized paranasal sinuses.  IMPRESSION: 1. No acute intracranial abnormality. 2. Stable appearance of advanced for age chronic microvascular ischemic white matter disease.   Electronically Signed   By: Malachy MoanHeath  McCullough M.D.   On: 05/23/2014 14:56     EKG Interpretation None      MDM   Final diagnoses:  Altered mental status, unspecified altered mental status type    Filed Vitals:   05/23/14 1336 05/23/14 1413  BP: 132/89   Pulse: 110 97  Temp: 98.2 F (36.8 C)   TempSrc: Oral   Resp: 20 18  SpO2: 100% 100%    Medications  potassium chloride SA (K-DUR,KLOR-CON) CR tablet 40 mEq (not administered)  naloxone (NARCAN) injection 0.4 mg (not administered)  sodium chloride 0.9 % bolus 1,000 mL (1,000 mLs Intravenous New Bag/Given 05/23/14 1433)    Sherran NeedsGwendolyn Hiegel is a 63 y.o. female presenting with altered mental status.  Neuro exam is nonfocal, patient appears somnolent. Patient seen and evaluated by attending physician who is concerned about CVA secondary to aphasia.   ABG with no significant  abnormalities. Chemistry with a mild hypokalemia. CBC within normal limits. Head CT with no acute abnormalities. Blood work otherwise unremarkable. There is a delay in obtaining the ammonia and ethanol level. The left lateral ureter on recent. Patient continues to have slurred speech aphasia and is only oriented to self. Will need admission for altered mental status rule out CVA.  Will be admitted to internal medicine teaching service to attending physician Dr. Drue SecondSnider, case discussed with resident Dr. Deon PillingKinnerly     Dishon Kehoe, PA-C 05/24/14 1623
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
## 2014-05-23 NOTE — ED Notes (Signed)
Patient transported to CT 

## 2014-05-23 NOTE — ED Notes (Signed)
Patient is resident at [facility], patient reportedly left facility last night around midnight and came back to around noon in altered state, patient a/o x 1 at this time

## 2014-05-24 ENCOUNTER — Encounter (HOSPITAL_COMMUNITY): Payer: Self-pay | Admitting: General Practice

## 2014-05-24 DIAGNOSIS — I10 Essential (primary) hypertension: Secondary | ICD-10-CM

## 2014-05-24 DIAGNOSIS — F209 Schizophrenia, unspecified: Secondary | ICD-10-CM

## 2014-05-24 DIAGNOSIS — F319 Bipolar disorder, unspecified: Secondary | ICD-10-CM

## 2014-05-24 DIAGNOSIS — G9340 Encephalopathy, unspecified: Secondary | ICD-10-CM

## 2014-05-24 LAB — COMPREHENSIVE METABOLIC PANEL
ALT: 15 U/L (ref 0–35)
AST: 30 U/L (ref 0–37)
Albumin: 2.9 g/dL — ABNORMAL LOW (ref 3.5–5.2)
Alkaline Phosphatase: 69 U/L (ref 39–117)
Anion gap: 11 (ref 5–15)
BUN: 11 mg/dL (ref 6–23)
CO2: 25 mEq/L (ref 19–32)
Calcium: 8.9 mg/dL (ref 8.4–10.5)
Chloride: 107 mEq/L (ref 96–112)
Creatinine, Ser: 0.76 mg/dL (ref 0.50–1.10)
GFR calc Af Amer: >90 mL/min (ref >90)
GFR calc non Af Amer: 89 mL/min — ABNORMAL LOW (ref >90)
Glucose, Bld: 106 mg/dL — ABNORMAL HIGH (ref 70–99)
Potassium: 4 mEq/L (ref 3.7–5.3)
Sodium: 143 mEq/L (ref 137–147)
Total Bilirubin: 0.3 mg/dL (ref 0.3–1.2)
Total Protein: 6.4 g/dL (ref 6.0–8.3)

## 2014-05-24 LAB — CBC
HCT: 36.8 % (ref 36.0–46.0)
Hemoglobin: 12 g/dL (ref 12.0–15.0)
MCH: 30.6 pg (ref 26.0–34.0)
MCHC: 32.6 g/dL (ref 30.0–36.0)
MCV: 93.9 fL (ref 78.0–100.0)
Platelets: 225 10*3/uL (ref 150–400)
RBC: 3.92 MIL/uL (ref 3.87–5.11)
RDW: 13.5 % (ref 11.5–15.5)
WBC: 6.8 10*3/uL (ref 4.0–10.5)

## 2014-05-24 LAB — HIV ANTIBODY (ROUTINE TESTING W REFLEX): HIV 1&2 Ab, 4th Generation: NONREACTIVE

## 2014-05-24 LAB — MRSA PCR SCREENING: MRSA by PCR: NEGATIVE

## 2014-05-24 LAB — AMMONIA: Ammonia: 26 umol/L (ref 11–60)

## 2014-05-24 NOTE — Progress Notes (Signed)
Subjective: Patient is feeling better today. She was talking to her family per her baseline. Is more oriented today. Feeling hungry and wants to eat. Has some pain on her legs. Denies cp/sob/n/v/fever/chills.   Objective: Vital signs in last 24 hours: Filed Vitals:   05/24/14 0047 05/24/14 0400 05/24/14 0824 05/24/14 1150  BP: 125/81 113/61 146/104 169/93  Pulse: 92 87 90 86  Temp: 97.5 F (36.4 C) 97.1 F (36.2 C) 98.3 F (36.8 C) 98.2 F (36.8 C)  TempSrc: Axillary Axillary Oral Oral  Resp: 18 17 21 21   Height:      Weight:      SpO2: 92% 95% 99% 100%   Weight change:   Intake/Output Summary (Last 24 hours) at 05/24/14 1507 Last data filed at 05/24/14 0500  Gross per 24 hour  Intake    550 ml  Output      0 ml  Net    550 ml   Vitals reviewed.  General: resting in bed, NAD. Still appears to be drowsy but more alert than yesterday. Able to interact and follow commands but movement is slow. HEENT: PERRL, EOMI, no scleral icterus Cardiac: RRR, no rubs, murmurs or gallops Pulm: clear to auscultation bilaterally, no wheezes, rales, or rhonchi Abd: soft, nontender, nondistended, BS present Ext: warm and well perfused.  Neuro: alert and oriented to self and place. Not to date. cranial nerves II-XII grossly intact, strength and sensation to light touch equal in bilateral upper and lower extremities. Movement is slow but able to follow command appropriately.   Lab Results: Basic Metabolic Panel:  Recent Labs Lab 05/23/14 1420 05/23/14 1428 05/24/14 0420  NA 143 142 143  K 3.5* 3.1* 4.0  CL 102 101 107  CO2 26  --  25  GLUCOSE 102* 106* 106*  BUN 11 11 11   CREATININE 0.79 0.90 0.76  CALCIUM 9.6  --  8.9   Liver Function Tests:  Recent Labs Lab 05/23/14 1420 05/24/14 0420  AST 40* 30  ALT 18 15  ALKPHOS 85 69  BILITOT <0.2* 0.3  PROT 7.8 6.4  ALBUMIN 3.7 2.9*   No results found for this basename: LIPASE, AMYLASE,  in the last 168 hours  Recent  Labs Lab 05/23/14 0030  AMMONIA 26   CBC:  Recent Labs Lab 05/23/14 1420 05/23/14 1428 05/24/14 0420  WBC 9.8  --  6.8  NEUTROABS 4.8  --   --   HGB 14.3 16.0* 12.0  HCT 41.7 47.0* 36.8  MCV 91.9  --  93.9  PLT 243  --  225  CBG:  Recent Labs Lab 05/23/14 1420  GLUCAP 111*  Coagulation:  Recent Labs Lab 05/23/14 1420  LABPROT 14.0  INR 1.08  Urine Drug Screen: Drugs of Abuse     Component Value Date/Time   LABOPIA NONE DETECTED 05/23/2014 1540   LABOPIA NEG 08/01/2008 2209   COCAINSCRNUR NONE DETECTED 05/23/2014 1540   COCAINSCRNUR NEG 08/01/2008 2209   LABBENZ POSITIVE* 05/23/2014 1540   LABBENZ NEG 08/01/2008 2209   AMPHETMU NONE DETECTED 05/23/2014 1540   AMPHETMU NEG 08/01/2008 2209   THCU NONE DETECTED 05/23/2014 1540   LABBARB NONE DETECTED 05/23/2014 1540    Alcohol Level:  Recent Labs Lab 05/23/14 1735  ETH <11   Urinalysis:  Recent Labs Lab 05/23/14 1540  COLORURINE YELLOW  LABSPEC 1.020  PHURINE 7.5  GLUCOSEU NEGATIVE  HGBUR NEGATIVE  BILIRUBINUR NEGATIVE  KETONESUR NEGATIVE  PROTEINUR 100*  UROBILINOGEN 0.2  NITRITE NEGATIVE  LEUKOCYTESUR NEGATIVE   Misc. Labs:   Micro Results: Recent Results (from the past 240 hour(s))  MRSA PCR SCREENING     Status: None   Collection Time    05/23/14 10:09 PM      Result Value Ref Range Status   MRSA by PCR NEGATIVE  NEGATIVE Final   Comment:            The GeneXpert MRSA Assay (FDA     approved for NASAL specimens     only), is one component of a     comprehensive MRSA colonization     surveillance program. It is not     intended to diagnose MRSA     infection nor to guide or     monitor treatment for     MRSA infections.   Studies/Results: Dg Chest 2 View  05/23/2014   CLINICAL DATA:  Altered mental status.  EXAM: CHEST  2 VIEW  COMPARISON:  12/08/2013  FINDINGS: Cardiac silhouette is mildly enlarged. Aorta is mildly uncoiled. No mediastinal or hilar masses. No evidence of adenopathy.   Lungs are clear.  No pleural effusion or pneumothorax.  Bony thorax is demineralized but intact.  IMPRESSION: No acute cardiopulmonary disease.   Electronically Signed   By: Amie Portland M.D.   On: 05/23/2014 16:06   Ct Head Wo Contrast  05/23/2014   CLINICAL DATA:  Altered mental status  EXAM: CT HEAD WITHOUT CONTRAST  TECHNIQUE: Contiguous axial images were obtained from the base of the skull through the vertex without intravenous contrast.  COMPARISON:  Prior head CT 12/08/2013; prior brain MRI 12/09/2013  FINDINGS: Negative for acute intracranial hemorrhage, acute infarction, mass, mass effect, hydrocephalus or midline shift. Gray-white differentiation is preserved throughout. Periventricular, subcortical and deep white matter hypoattenuation most consistent with the sequelae of longstanding chronic microvascular ischemia. No acute soft tissue or calvarial abnormality. The globes and orbits are symmetric and unremarkable. Normal aeration of the mastoid air cells and visualized paranasal sinuses.  IMPRESSION: 1. No acute intracranial abnormality. 2. Stable appearance of advanced for age chronic microvascular ischemic white matter disease.   Electronically Signed   By: Malachy Moan M.D.   On: 05/23/2014 14:56   Mr Brain Wo Contrast  05/23/2014   CLINICAL DATA:  Altered mental status.  EXAM: MRI HEAD WITHOUT CONTRAST  TECHNIQUE: Multiplanar, multiecho pulse sequences of the brain and surrounding structures were obtained without intravenous contrast.  COMPARISON:  05/23/2014 CT head.  FINDINGS: The patient was unable to remain motionless for the exam. Small or subtle lesions could be overlooked. No evidence for acute infarction, hemorrhage, mass lesion, hydrocephalus, or extra-axial fluid. Normal for age cerebral volume. Mild to moderate subcortical and periventricular T2 and FLAIR hyperintensities, likely chronic microvascular ischemic change. Flow voids are maintained throughout the carotid, basilar, and  vertebral arteries. There are no areas of chronic hemorrhage. Pituitary, pineal, and cerebellar tonsils unremarkable. No upper cervical lesions. Visualized calvarium, skull base, and upper cervical osseous structures unremarkable. Scalp and extracranial soft tissues, orbits, sinuses, and mastoids show no acute process.  IMPRESSION: Motion degraded exam demonstrating no acute intracranial findings. Mild to moderate small vessel disease.   Electronically Signed   By: Davonna Belling M.D.   On: 05/23/2014 17:20   Medications: I have reviewed the patient's current medications. Scheduled Meds: . benztropine  1 mg Oral BID  . citalopram  40 mg Oral Daily  . enoxaparin (LOVENOX) injection  40 mg Subcutaneous Q24H  . gabapentin  300  mg Oral TID  . pantoprazole  40 mg Oral Daily  . risperiDONE  1 mg Oral BID   Continuous Infusions: . sodium chloride 100 mL/hr at 05/23/14 2310   PRN Meds:.acetaminophen, acetaminophen, ondansetron (ZOFRAN) IV, ondansetron Assessment/Plan: Principal Problem:   Altered mental status Active Problems:   SUBSTANCE ABUSE, MULTIPLE   HYPERTENSION, BENIGN ESSENTIAL   Schizophrenia, paranoid type   Acute encephalopathy  Acute Encephalopathy  - likely 2/2 benzo overdose. Has high risk given her other mental disorders.  - She was never prescribed benzos. Has been positive in the past. She has hx of benzo abuse so will not reverse it as it can cause seizures.  - she is improving by herself but still remains drowsy. Will let benzo clear itself out from her system.  - stroke ruled out with CT head and MRI brain normal.  - infection unlikely given no fever, normal UA, normal CXR. BMP is normal except for mild low K.  - Repleted K+. Check BMP tomorrow.  Schizophrenia and bipolar  -cont home meds: citalopram, benztropine, risperidone - holding off risperidol 25mg  IM that she gets biweekly. Was due today. This may worsen her sedation.  HTN - well controlled currently without any  med.   Dispo: Disposition is deferred at this time, awaiting improvement of current medical problems.  Anticipated discharge in approximately 2-3 day(s).   The patient does not have a current PCP (No Pcp Per Patient) and [facility].  The patient does not know have transportation limitations that hinder transportation to clinic appointments.  .Services Needed at time of discharge: Y = Yes, Blank = No PT:   OT:   RN:   Equipment:   Other:     LOS: 1 day   Hyacinth Meekerasrif Zoe Creasman, MD 05/24/2014, 3:07 PM

## 2014-05-24 NOTE — Progress Notes (Signed)
Dr. Senaida Oresichardson notified of 5 beat run vtach. Has transfer orders to med surg. Orders changed to a tele bed and states will see patient.

## 2014-05-24 NOTE — Progress Notes (Signed)
Report called to Lurena Joinerebecca, RN on  [facility].

## 2014-05-24 NOTE — H&P (Signed)
  Date: 05/24/2014  Patient name: Danielle [name]  Medical record number: [id number]  Date of birth: Nov 03, 1950   This patient has been seen and the plan of care was discussed with the house staff. Please see their note for complete details. I concur with their findings with the following additions/corrections:  Altered mental status likely related benzo ingestion. Will continue with supportive measure and track down medication schedule from psychiatrist.  Judyann Munsonynthia Lillyann Ahart, MD 05/24/2014, 4:14 PM

## 2014-05-25 MED ORDER — IBUPROFEN 600 MG PO TABS
600.0000 mg | ORAL_TABLET | Freq: Once | ORAL | Status: AC
  Administered 2014-05-26: 600 mg via ORAL
  Filled 2014-05-25: qty 1

## 2014-05-25 NOTE — ED Provider Notes (Signed)
Medical screening examination/treatment/procedure(s) were conducted as a shared visit with non-physician practitioner(s) and myself.  I personally evaluated the patient during the encounter.   EKG Interpretation None      Patient here with AMS. Lives at a shelter. Didn't come home last night. Here with social/case workers - stating much different than baseline. Patient will follow commands, but has slurred speech, not acting appropriately. Concern for possible stroke. Lab work unremarkable. Admitted.  Elwin MochaBlair Macallan Ord, MD 05/25/14 [phone]

## 2014-05-25 NOTE — Progress Notes (Signed)
Occupational Therapy Evaluation and Discharge Patient Details Name: Danielle [name] MRN: [id number] DOB: 12-24-1950 Today's Date: 05/25/2014    History of Present Illness Danielle [name] is a 63 y.o. Female admitted 05/23/14 with altered mental status. Hx of schizophrenia and drug abuse. She currently resides in a shelter.   Clinical Impression   PTA pt [facility] and was independent with ADLs and functional mobility. Pt's symptoms are resolving, however pt reports Bil knees buckle at times and raise safety concerns. Pt was able to ambulate around room with Supervision and [facility]). Pt overall at Mod Independent for ADLs and no acute OT concerns.     Follow Up Recommendations  No OT follow up    Equipment Recommendations  None recommended by OT       Precautions / Restrictions Precautions Precautions: Fall Restrictions Weight Bearing Restrictions: No      Mobility Bed Mobility Overal bed mobility: Modified Independent                Transfers Overall transfer level: Needs assistance Equipment used: None Transfers: Sit to/from Stand Sit to Stand: Supervision Stand pivot transfers: Min guard       General transfer comment: Initially min guard, however progressed to Supervision. Pt reports Bil knees buckle at times (previous problem) but this was not observed. Pt believes this has to do with the pain in L calf area.          ADL Overall ADL's : Modified independent                                       General ADL Comments: Pt overall at mod Independence for ADLs and [facility] (to trial with PT). Educated pt on fall prevention and energy conservation, including sitting for LB ADLs. Pt ambulated to bathroom with Supervision and performed oral care standing at sink.      Vision  Pt reports no change from baseline.                     Perception Perception Perception Tested?: No   Praxis Praxis Praxis tested?: Within functional limits    Pertinent Vitals/Pain Pain Assessment: 0-10 Pain Score: 5  Faces Pain Scale: Hurts little more Pain Location: L calf; increased pain with plantar flexion Pain Descriptors / Indicators: Sore Pain Intervention(s): Monitored during session;Repositioned     Hand Dominance Right   Extremity/Trunk Assessment Upper Extremity Assessment Upper Extremity Assessment: Overall WFL for tasks assessed   Lower Extremity Assessment Lower Extremity Assessment: Overall WFL for tasks assessed (pt reports Bil knees buckle (not observed))   Cervical / Trunk Assessment Cervical / Trunk Assessment: Normal   Communication Communication Communication: No difficulties   Cognition Arousal/Alertness: Awake/alert Behavior During Therapy: WFL for tasks assessed/performed Overall Cognitive Status: History of cognitive impairments - at baseline       Memory: Decreased short-term memory                        Home Living Family/patient expects to be discharged to:: Shelter/Homeless                                 [facility]  Prior Functioning/Environment Level of Independence: Independent                                       End of Session Equipment Utilized During Treatment: Gait belt Nurse Communication: Mobility status  Activity Tolerance: Patient tolerated treatment well Patient left: in bed;with call bell/phone within reach;with family/visitor present   Time: 1610-9604 OT Time Calculation (min): 29 min Charges:  OT General Charges $OT Visit: 1 Procedure OT Evaluation $Initial OT Evaluation Tier I: 1 Procedure OT Treatments $Self Care/Home Management : 23-37 mins  Rae Lips [phone] 05/25/2014, 4:58 PM

## 2014-05-25 NOTE — Evaluation (Signed)
Physical Therapy Evaluation Patient Details Name: Danielle [name] MRN: [id number] DOB: 1951-05-20 Today's Date: 05/25/2014   History of Present Illness  Pt admitted with altered mental status.  She has a history of paranoid schizophrenia and drug abuse.  She currently resides in a shelter.  Clinical Impression  Pt admitted with altered mental status.  She presents on eval as being lethargic with an unsteady gait.  Pt would benefit from skilled PT intervention to increase strength and balance, increase independence with mobility and provide education for safe d/c from hospital.  Recommending a single point cane and no f/u PT services upon d/c.    Follow Up Recommendations No PT follow up    Equipment Recommendations  Cane    Recommendations for Other Services       Precautions / Restrictions        Mobility  Bed Mobility Overal bed mobility: Modified Independent                Transfers Overall transfer level: Needs assistance Equipment used: None Transfers: Sit to/from Stand;Stand Pivot Transfers Sit to Stand: Min guard Stand pivot transfers: Min guard       General transfer comment: verbal cues for safety  Ambulation/Gait Ambulation/Gait assistance: Min assist Ambulation Distance (Feet): 75 Feet Assistive device: None Gait Pattern/deviations: Decreased stride length;Narrow base of support   Gait velocity interpretation: Below normal speed for age/gender    Stairs            Wheelchair Mobility    Modified Rankin (Stroke Patients Only)       Balance                                             Pertinent Vitals/Pain Pain Assessment: Faces Faces Pain Scale: Hurts little more Pain Location: L ankle/distal LE Pain Intervention(s): Monitored during session    Home Living Family/patient expects to be discharged to:: Shelter/Homeless                      Prior Function Level of Independence: Independent                Hand Dominance   Dominant Hand: Right    Extremity/Trunk Assessment   Upper Extremity Assessment: Overall WFL for tasks assessed           Lower Extremity Assessment: Overall WFL for tasks assessed         Communication   Communication: No difficulties  Cognition Arousal/Alertness: Lethargic Behavior During Therapy: WFL for tasks assessed/performed Overall Cognitive Status: Within Functional Limits for tasks assessed                      General Comments      Exercises        Assessment/Plan    PT Assessment Patient needs continued PT services  PT Diagnosis Difficulty walking;Acute pain   PT Problem List Decreased activity tolerance;Decreased balance;Decreased mobility;Pain  PT Treatment Interventions DME instruction;Gait training;Stair training;Functional mobility training;Therapeutic activities;Therapeutic exercise;Balance training;Patient/family education   PT Goals (Current goals can be found in the Care Plan section) Acute Rehab PT Goals Patient Stated Goal: get a cane PT Goal Formulation: With patient Time For Goal Achievement: 05/25/14 Potential to Achieve Goals: Good    Frequency Min 3X/week   Barriers to discharge        Co-evaluation  End of Session Equipment Utilized During Treatment: Gait belt Activity Tolerance: Patient tolerated treatment well Patient left: in bed;with family/visitor present;with call bell/phone within reach Nurse Communication: Mobility status         Time: [id number] PT Time Calculation (min): 13 min   Charges:   PT Evaluation $Initial PT Evaluation Tier I: 1 Procedure PT Treatments $Gait Training: 8-22 mins   PT G Codes:          Ilda FoilGarrow, Royetta Probus Rene 05/25/2014, 3:03 PM  Aida RaiderWendy Lalana Wachter, PT  Office # [phone] Pager [phone]

## 2014-05-25 NOTE — Progress Notes (Signed)
Subjective: No overnight events. She states that she is ready to go home to the [employer] as all her possessions are there. Now she remembers being given a the night before her presentation that she believes was "spiked".     Objective: Vital signs in last 24 hours: Filed Vitals:   05/24/14 2007 05/25/14 0414 05/25/14 0930 05/25/14 1759  BP: 131/84 133/83 145/86 168/94  Pulse: 85 83 95 95  Temp: 98.3 F (36.8 C) 98.3 F (36.8 C) 98 F (36.7 C) 98.1 F (36.7 C)  TempSrc: Oral Axillary Oral Oral  Resp: 20 17 18 18   Height: 4\' 11"  (1.499 m)     Weight: 149 lb 12.8 oz (67.949 kg)     SpO2: 96% 94% 94% 95%   Weight change: 1 lb 3.4 oz (0.549 kg)  Intake/Output Summary (Last 24 hours) at 05/25/14 1832 Last data filed at 05/25/14 0931  Gross per 24 hour  Intake    920 ml  Output   1100 ml  Net   -180 ml   Vitals reviewed.  General: resting in bed, NAD. Following commands and responding appropriately. At her baseline per her daughter at her bedside.   HEENT: no scleral icterus  Cardiac: RRR, no rubs, murmurs or gallops  Pulm: clear to auscultation bilaterally, no wheezes, rales, or rhonchi  Abd: soft, nontender, nondistended, BS present  Ext: warm and well perfused  Neuro: Alert and oriented to place, time, situation, and person (improved from presentation). Cerebellar movements (finger-to-nose test) intact with no ataxia. Speech fluent with no ataxia. Moves all extremities voluntarily.  Lab Results: Basic Metabolic Panel:  Recent Labs Lab 05/23/14 1420 05/23/14 1428 05/24/14 0420  NA 143 142 143  K 3.5* 3.1* 4.0  CL 102 101 107  CO2 26  --  25  GLUCOSE 102* 106* 106*  BUN 11 11 11   CREATININE 0.79 0.90 0.76  CALCIUM 9.6  --  8.9   Liver Function Tests:  Recent Labs Lab 05/23/14 1420 05/24/14 0420  AST 40* 30  ALT 18 15  ALKPHOS 85 69  BILITOT <0.2* 0.3  PROT 7.8 6.4  ALBUMIN 3.7 2.9*    Recent Labs Lab 05/23/14 0030  AMMONIA 26    CBC:  Recent Labs Lab 05/23/14 1420 05/23/14 1428 05/24/14 0420  WBC 9.8  --  6.8  NEUTROABS 4.8  --   --   HGB 14.3 16.0* 12.0  HCT 41.7 47.0* 36.8  MCV 91.9  --  93.9  PLT 243  --  225   CBG:  Recent Labs Lab 05/23/14 1420  GLUCAP 111*   Coagulation:  Recent Labs Lab 05/23/14 1420  LABPROT 14.0  INR 1.08   Urine Drug Screen: Drugs of Abuse     Component Value Date/Time   LABOPIA NONE DETECTED 05/23/2014 1540   LABOPIA NEG 08/01/2008 2209   COCAINSCRNUR NONE DETECTED 05/23/2014 1540   COCAINSCRNUR NEG 08/01/2008 2209   LABBENZ POSITIVE* 05/23/2014 1540   LABBENZ NEG 08/01/2008 2209   AMPHETMU NONE DETECTED 05/23/2014 1540   AMPHETMU NEG 08/01/2008 2209   THCU NONE DETECTED 05/23/2014 1540   LABBARB NONE DETECTED 05/23/2014 1540    Alcohol Level:  Recent Labs Lab 05/23/14 1735  ETH <11   Urinalysis:  Recent Labs Lab 05/23/14 1540  COLORURINE YELLOW  LABSPEC 1.020  PHURINE 7.5  GLUCOSEU NEGATIVE  HGBUR NEGATIVE  BILIRUBINUR NEGATIVE  KETONESUR NEGATIVE  PROTEINUR 100*  UROBILINOGEN 0.2  NITRITE NEGATIVE  LEUKOCYTESUR NEGATIVE  Micro Results: Recent Results (from the past 240 hour(s))  MRSA PCR SCREENING     Status: None   Collection Time    05/23/14 10:09 PM      Result Value Ref Range Status   MRSA by PCR NEGATIVE  NEGATIVE Final   Comment:            The GeneXpert MRSA Assay (FDA     approved for NASAL specimens     only), is one component of a     comprehensive MRSA colonization     surveillance program. It is not     intended to diagnose MRSA     infection nor to guide or     monitor treatment for     MRSA infections.   Medications: I have reviewed the patient's current medications. Scheduled Meds: . benztropine  1 mg Oral BID  . citalopram  40 mg Oral Daily  . enoxaparin (LOVENOX) injection  40 mg Subcutaneous Q24H  . gabapentin  300 mg Oral TID  . pantoprazole  40 mg Oral Daily  . risperiDONE  1 mg Oral BID   Continuous  Infusions: . sodium chloride 100 mL/hr at 05/25/14 0624   PRN Meds:.acetaminophen, acetaminophen, ondansetron (ZOFRAN) IV, ondansetron Assessment/Plan: 63 yr old woman with PMH of schizophrenia paranoid type presenting with AMS in setting of acute consumption of benzodiazapine Acute Encephalopathy - Resolved. Likely 2/2 benzo use which she thinks was given to her in a "spike" [place]. Has high risk given her other mental disorders. She has hx of benzo abuse so will not reverse it as it can cause seizures. She is back at her baseline. CT head/ MRI brain negative for acute intracranial abnormality.  Infection unlikely given no fever, normal UA, normal CXR.  -Continue monitoring  Schizophrenia and bipolar  -cont home meds: citalopram, benztropine, risperidone  - holding off risperidol 25mg  IM that she gets biweekly. Was due on 8/7 but [facility]   HTN - BP mildly elevated once. Not on medications at home.  -Continue monitoring  Dispo: Disposition is deferred at this time, awaiting improvement of current medical problems. Anticipated discharge in approximately 1-2 day(s).   The patient does not have a current PCP (No Pcp Per Patient) and does need an [facility] follow-up appointment after discharge.   The patient does not know have transportation limitations that hinder transportation to clinic appointments.   .Services Needed at time of discharge: Y = Yes, Blank = No PT:   OT:   RN:   Equipment:   Other:     LOS: 2 days   Ky BarbanSolianny D Lavanya Roa, MD 05/25/2014, 6:32 PM

## 2014-05-26 LAB — BASIC METABOLIC PANEL
Anion gap: 14 (ref 5–15)
BUN: 13 mg/dL (ref 6–23)
CO2: 24 mEq/L (ref 19–32)
Calcium: 9.2 mg/dL (ref 8.4–10.5)
Chloride: 103 mEq/L (ref 96–112)
Creatinine, Ser: 0.74 mg/dL (ref 0.50–1.10)
GFR calc Af Amer: >90 mL/min (ref >90)
GFR calc non Af Amer: 90 mL/min — ABNORMAL LOW (ref >90)
Glucose, Bld: 95 mg/dL (ref 70–99)
Potassium: 3.4 mEq/L — ABNORMAL LOW (ref 3.7–5.3)
Sodium: 141 mEq/L (ref 137–147)

## 2014-05-26 MED ORDER — LORATADINE 10 MG PO TABS
10.0000 mg | ORAL_TABLET | Freq: Once | ORAL | Status: AC
  Administered 2014-05-26: 10 mg via ORAL
  Filled 2014-05-26: qty 1

## 2014-05-26 NOTE — Progress Notes (Signed)
Subjective: She has no complaints this morning but tells me that she had leg cramps last night that improved with Ibuprofen.   Objective: Vital signs in last 24 hours: Filed Vitals:   05/25/14 0930 05/25/14 1759 05/25/14 2030 05/26/14 0411  BP: 145/86 168/94 157/93 135/85  Pulse: 95 95 87 87  Temp: 98 F (36.7 C) 98.1 F (36.7 C) 98.5 F (36.9 C) 98.5 F (36.9 C)  TempSrc: Oral Oral Oral Oral  Resp: 18 18 19 18   Height:      Weight:   153 lb 4.8 oz (69.536 kg)   SpO2: 94% 95% 95% 94%   Weight change: 3 lb 8 oz (1.588 kg)  Intake/Output Summary (Last 24 hours) at 05/26/14 0924 Last data filed at 05/26/14 [id number]  Gross per 24 hour  Intake    920 ml  Output    800 ml  Net    120 ml   Vitals reviewed.  General: resting in bed, NAD. Following commands and responding appropriately. At her baseline per her daughter at her bedside.  HEENT: no scleral icterus  Cardiac: RRR, no rubs, murmurs or gallops  Pulm: clear to auscultation bilaterally, no wheezes, rales, or rhonchi  Abd: soft, nontender, nondistended, BS present  Ext: warm and well perfused  Neuro: Alert and oriented to place, time, situation, and person (improved from presentation). Cerebellar movements (finger-to-nose test) intact with no ataxia. Speech fluent with no ataxia. Moves all extremities voluntarily.  Lab Results: Basic Metabolic Panel:  Recent Labs Lab 05/24/14 0420 05/26/14 0340  NA 143 141  K 4.0 3.4*  CL 107 103  CO2 25 24  GLUCOSE 106* 95  BUN 11 13  CREATININE 0.76 0.74  CALCIUM 8.9 9.2   Liver Function Tests:  Recent Labs Lab 05/23/14 1420 05/24/14 0420  AST 40* 30  ALT 18 15  ALKPHOS 85 69  BILITOT <0.2* 0.3  PROT 7.8 6.4  ALBUMIN 3.7 2.9*    Recent Labs Lab 05/23/14 0030  AMMONIA 26   CBC:  Recent Labs Lab 05/23/14 1420 05/23/14 1428 05/24/14 0420  WBC 9.8  --  6.8  NEUTROABS 4.8  --   --   HGB 14.3 16.0* 12.0  HCT 41.7 47.0* 36.8  MCV 91.9  --  93.9  PLT 243   --  225   CBG:  Recent Labs Lab 05/23/14 1420  GLUCAP 111*   Coagulation:  Recent Labs Lab 05/23/14 1420  LABPROT 14.0  INR 1.08   Urine Drug Screen: Drugs of Abuse     Component Value Date/Time   LABOPIA NONE DETECTED 05/23/2014 1540   LABOPIA NEG 08/01/2008 2209   COCAINSCRNUR NONE DETECTED 05/23/2014 1540   COCAINSCRNUR NEG 08/01/2008 2209   LABBENZ POSITIVE* 05/23/2014 1540   LABBENZ NEG 08/01/2008 2209   AMPHETMU NONE DETECTED 05/23/2014 1540   AMPHETMU NEG 08/01/2008 2209   THCU NONE DETECTED 05/23/2014 1540   LABBARB NONE DETECTED 05/23/2014 1540    Alcohol Level:  Recent Labs Lab 05/23/14 1735  ETH <11   Urinalysis:  Recent Labs Lab 05/23/14 1540  COLORURINE YELLOW  LABSPEC 1.020  PHURINE 7.5  GLUCOSEU NEGATIVE  HGBUR NEGATIVE  BILIRUBINUR NEGATIVE  KETONESUR NEGATIVE  PROTEINUR 100*  UROBILINOGEN 0.2  NITRITE NEGATIVE  LEUKOCYTESUR NEGATIVE    Micro Results: Recent Results (from the past 240 hour(s))  MRSA PCR SCREENING     Status: None   Collection Time    05/23/14 10:09 PM      Result  Value Ref Range Status   MRSA by PCR NEGATIVE  NEGATIVE Final   Comment:            The GeneXpert MRSA Assay (FDA     approved for NASAL specimens     only), is one component of a     comprehensive MRSA colonization     surveillance program. It is not     intended to diagnose MRSA     infection nor to guide or     monitor treatment for     MRSA infections.   Medications: I have reviewed the patient's current medications. Scheduled Meds: . benztropine  1 mg Oral BID  . citalopram  40 mg Oral Daily  . enoxaparin (LOVENOX) injection  40 mg Subcutaneous Q24H  . gabapentin  300 mg Oral TID  . pantoprazole  40 mg Oral Daily  . risperiDONE  1 mg Oral BID   Continuous Infusions: . sodium chloride 100 mL/hr at 05/26/14 0417   PRN Meds:.acetaminophen, acetaminophen, ondansetron (ZOFRAN) IV, ondansetron Assessment/Plan: 63 yr old woman with PMH of  schizophrenia paranoid type presenting with AMS in setting of acute consumption of benzodiazapine   Acute Encephalopathy - Resolved. Likely 2/2 benzo use which she thinks was given to her in a "spike" [place]. Has high risk given her other mental disorders. She has hx of benzo abuse so will not reverse it as it can cause seizures. She is back at her baseline. CT head/ MRI brain negative for acute intracranial abnormality. Infection unlikely given no fever, normal UA, normal CXR.  -Continue monitoring   Schizophrenia and bipolar  -cont home meds: citalopram, benztropine, risperidone  - holding off risperidol 25mg  IM that she gets biweekly. Was due on 8/7 but she will follow up with [facility] on Monday.   HTN - BP mildly elevated once today. Not on medications at home.  -Continue monitoring   Dispo: Disposition is deferred at this time, awaiting improvement of current medical problems. Anticipated discharge approximately tomorrow. Her daughter tell me that the patient will be accepted back to the [facility] and will be able to have permanent housing in 2 weeks.    The patient does not have a current PCP (No Pcp Per Patient) and does NOT need an OPC follow up visit. She will follow up with the [facility] for primary care.    The patient does not have transportation limitations  (her daughter takes her to appointments) that hinder transportation to clinic appointments.  .Services Needed at time of discharge: Y = Yes, Blank = No PT:   OT:   RN:   Equipment: cane  Other:     LOS: 3 days   Ky Barban, MD 05/26/2014, 9:24 AM

## 2014-05-27 LAB — BASIC METABOLIC PANEL
Anion gap: 12 (ref 5–15)
BUN: 16 mg/dL (ref 6–23)
CO2: 26 mEq/L (ref 19–32)
Calcium: 9.4 mg/dL (ref 8.4–10.5)
Chloride: 106 mEq/L (ref 96–112)
Creatinine, Ser: 0.89 mg/dL (ref 0.50–1.10)
GFR calc Af Amer: 79 mL/min — ABNORMAL LOW (ref >90)
GFR calc non Af Amer: 69 mL/min — ABNORMAL LOW (ref >90)
Glucose, Bld: 108 mg/dL — ABNORMAL HIGH (ref 70–99)
Potassium: 3.7 mEq/L (ref 3.7–5.3)
Sodium: 144 mEq/L (ref 137–147)

## 2014-05-27 MED ORDER — CITALOPRAM HYDROBROMIDE 40 MG PO TABS: 20.0000 mg | ORAL_TABLET | Freq: Every day | ORAL | Status: DC

## 2014-05-27 NOTE — Progress Notes (Signed)
Pt discharge instructions given, pt verbalized understanding. Pt left floor ambulating accompanied by staff and family

## 2014-05-27 NOTE — Discharge Summary (Signed)
Name: Danielle [name] MRN: [id number] DOB: December 28, 1950 63 y.o. PCP: Dorrene German, MD  Date of Admission: 05/23/2014  1:25 PM Date of Discharge: 05/27/2014 Attending Physician: Judyann Munson, MD  Discharge Diagnosis: Principal Problem:   Altered mental status Active Problems:   SUBSTANCE ABUSE, MULTIPLE   HYPERTENSION, BENIGN ESSENTIAL   Schizophrenia, paranoid type   Acute encephalopathy  Discharge Medications:   Medication List         benztropine 1 MG tablet  Commonly known as:  COGENTIN  Take 1 mg by mouth 2 (two) times daily.     citalopram 40 MG tablet  Commonly known as:  CELEXA  Take 0.5 tablets (20 mg total) by mouth daily.     gabapentin 300 MG capsule  Commonly known as:  NEURONTIN  Take 300 mg by mouth 3 (three) times daily.     omeprazole 20 MG capsule  Commonly known as:  PRILOSEC  Take 20 mg by mouth daily as needed (acid reflux).     risperiDONE 1 MG tablet  Commonly known as:  RISPERDAL  Take 1 mg by mouth 2 (two) times daily.     traZODone 100 MG tablet  Commonly known as:  DESYREL  Take 200 mg by mouth at bedtime.        Disposition and follow-up:   Danielle [name] was discharged from [facility] in Good condition.  At the hospital follow up visit please address:  1.  Address substance abuse. Assess need for anti-hypertensive meds.   2.  Labs / imaging needed at time of follow-up: BMP. K+ has been running low in the hospital and we have repleted it few times.   3.  Pending labs/ test needing follow-up: GC/Chlamydia test pending.  Follow-up Appointments: Follow-up Information   Schedule an appointment as soon as possible for a visit with Dorrene German, MD.   Specialty:  Internal Medicine   Contact information:   [address] [place] [address] [phone]       Discharge Instructions:   Consultations:    Procedures Performed:  Dg Chest 2 View  05/23/2014   CLINICAL DATA:  Altered mental  status.  EXAM: CHEST  2 VIEW  COMPARISON:  12/08/2013  FINDINGS: Cardiac silhouette is mildly enlarged. Aorta is mildly uncoiled. No mediastinal or hilar masses. No evidence of adenopathy.  Lungs are clear.  No pleural effusion or pneumothorax.  Bony thorax is demineralized but intact.  IMPRESSION: No acute cardiopulmonary disease.   Electronically Signed   By: Amie Portland M.D.   On: 05/23/2014 16:06   Ct Head Wo Contrast  05/23/2014   CLINICAL DATA:  Altered mental status  EXAM: CT HEAD WITHOUT CONTRAST  TECHNIQUE: Contiguous axial images were obtained from the base of the skull through the vertex without intravenous contrast.  COMPARISON:  Prior head CT 12/08/2013; prior brain MRI 12/09/2013  FINDINGS: Negative for acute intracranial hemorrhage, acute infarction, mass, mass effect, hydrocephalus or midline shift. Gray-white differentiation is preserved throughout. Periventricular, subcortical and deep white matter hypoattenuation most consistent with the sequelae of longstanding chronic microvascular ischemia. No acute soft tissue or calvarial abnormality. The globes and orbits are symmetric and unremarkable. Normal aeration of the mastoid air cells and visualized paranasal sinuses.  IMPRESSION: 1. No acute intracranial abnormality. 2. Stable appearance of advanced for age chronic microvascular ischemic white matter disease.   Electronically Signed   By: Malachy Moan M.D.   On: 05/23/2014 14:56   Mr Brain Wo Contrast  05/23/2014   CLINICAL DATA:  Altered mental status.  EXAM: MRI HEAD WITHOUT CONTRAST  TECHNIQUE: Multiplanar, multiecho pulse sequences of the brain and surrounding structures were obtained without intravenous contrast.  COMPARISON:  05/23/2014 CT head.  FINDINGS: The patient was unable to remain motionless for the exam. Small or subtle lesions could be overlooked. No evidence for acute infarction, hemorrhage, mass lesion, hydrocephalus, or extra-axial fluid. Normal for age cerebral  volume. Mild to moderate subcortical and periventricular T2 and FLAIR hyperintensities, likely chronic microvascular ischemic change. Flow voids are maintained throughout the carotid, basilar, and vertebral arteries. There are no areas of chronic hemorrhage. Pituitary, pineal, and cerebellar tonsils unremarkable. No upper cervical lesions. Visualized calvarium, skull base, and upper cervical osseous structures unremarkable. Scalp and extracranial soft tissues, orbits, sinuses, and mastoids show no acute process.  IMPRESSION: Motion degraded exam demonstrating no acute intracranial findings. Mild to moderate small vessel disease.   Electronically Signed   By: Davonna Belling M.D.   On: 05/23/2014 17:20   Admission HPI:   patient is 63 yo female with hx of schizophrenia, asthma, depression, bipolar, CAD, tardive dyskinesia, CVA here with altered mental status. She is a resident at the [facility]. She was last seen getting out of that shelter at last night around midnight. She was then found on her bed this morning around 10 AM. She was confused, somnolent, slurred speech, was without her clothes. She didn't remember where she was or where she has been. Denies taking any meds or drugs that wasn't hers. Denies any person giving her anything.  Denies any chest pain/SOB, diarrhea, fever, chills, dysuria. Has some cough. No hemoptysis. Denies any other symptoms. Admits to having memory problem. Has substantial mental disorder. Is trying to get her life back.  Oldest Daughter is present in the room. Her contact info is Danielle [name] [phone] or [phone]. Patient makes her own decisions. She does not have a POA. States that her other daughter Suzette Battiest has been stealing her money and claims that she has POA for the patient. Patient does not want to discuss any of her medical issues with [name].  Hospital Course by problem list:   Acute Encephalopathy - resolved  - likely 2/2 benzo overdose. Has  high risk given her other mental disorders.  - She was never prescribed benzos. Has been positive in the past. She has hx of benzo abuse so will not reverse it as it can cause seizures.  - we didn't reverse it since it could have been a chronic abuse and reversing it could cause seizures. We did supportive therapy and let her system clear out the effect of benzo.  - she has progressively improved in the past few days, indicating benzo action has likely cleared up from her system.  - stroke ruled out with CT head and MRI brain normal.  - infection unlikely given no fever, normal UA, normal CXR. BMP is normal except for mild low K which has been repleted here.   Schizophrenia and bipolar - controlled -continued home meds: citalopram, benztropine, risperidone.  - holding off risperidol 25mg  IM that she gets biweekly. Was due on 8/7. This may worsen her sedation.  HTN - mostly controlled, however some 160's/90's.  - slightly elevated. Is not on any med now. Will defer HTN management to PCP Discharge Vitals:   BP 138/85  Pulse 85  Temp(Src) 98 F (36.7 C) (Oral)  Resp 18  Ht 4\' 11"  (1.499 m)  Wt 67.586 kg (149  lb)  BMI 30.08 kg/m2  SpO2 94%  Discharge Labs:  Results for orders placed during the hospital encounter of 05/23/14 (from the past 24 hour(s))  BASIC METABOLIC PANEL     Status: Abnormal   Collection Time    05/27/14  9:44 AM      Result Value Ref Range   Sodium 144  137 - 147 mEq/L   Potassium 3.7  3.7 - 5.3 mEq/L   Chloride 106  96 - 112 mEq/L   CO2 26  19 - 32 mEq/L   Glucose, Bld 108 (*) 70 - 99 mg/dL   BUN 16  6 - 23 mg/dL   Creatinine, Ser 1.610.89  0.50 - 1.10 mg/dL   Calcium 9.4  8.4 - 09.610.5 mg/dL   GFR calc non Af Amer 69 (*) >90 mL/min   GFR calc Af Amer 79 (*) >90 mL/min   Anion gap 12  5 - 15    Signed: Hyacinth Meekerasrif Ayush Boulet, MD 05/27/2014, 11:32 AM    Services Ordered on Discharge:   Equipment Ordered on Discharge: Cane.

## 2014-05-27 NOTE — Care Management Note (Signed)
CARE MANAGEMENT NOTE 05/27/2014  Patient:  Danielle [name],Danielle [name]   Account Number:  [id number]  Date Initiated:  05/27/2014  Documentation initiated by:  Johny ShockOYAL,Dartha Rozzell  Subjective/Objective Assessment:     Action/Plan:   order for cane, [facility] notified   Anticipated DC Date:  05/27/2014   Anticipated DC Plan:  HOME/SELF CARE         Choice offered to / List presented to:     DME arranged  CANE      DME agency  [facility]        Status of service:  Completed, signed off Medicare Important Message given?  YES (If response is "NO", the following Medicare IM given date fields will be blank) Date Medicare IM given:  05/27/2014 Medicare IM given by:  [facility] CHAVIS,ALESIA Date Additional Medicare IM given:   Additional Medicare IM given by:    Discharge Disposition:    Per UR Regulation:    If discussed at Long Length of Stay Meetings, dates discussed:    Comments:

## 2014-05-27 NOTE — Progress Notes (Signed)
Medicare Important Message given?  YES (If response is "NO", the following Medicare IM given date fields will be blank) Date Medicare IM given:  05/27/2014 Medicare IM given by:  Tabithia Stroder 

## 2014-05-27 NOTE — Progress Notes (Signed)
Subjective: Patient is feeling better today. Ready go to back to [facility]. Has some pain behind her calves when walking. Denies any other complaints. Family has asked for a cane to go home. She has been using someone else's at the shelter.  Denies cp/sob/n/v/fever/chills.   Objective: Vital signs in last 24 hours: Filed Vitals:   05/26/14 1005 05/26/14 1710 05/26/14 2054 05/27/14 0449  BP: 152/90 162/107 154/100 147/95  Pulse: 91 86 83 80  Temp: 98.2 F (36.8 C) 97.5 F (36.4 C) 97.6 F (36.4 C) 97.3 F (36.3 C)  TempSrc: Oral Oral Oral Oral  Resp: 20 20 17 19   Height:      Weight:   67.586 kg (149 lb)   SpO2: 96% 96% 96% 94%   Weight change: -1.95 kg (-4 lb 4.8 oz)  Intake/Output Summary (Last 24 hours) at 05/27/14 0829 Last data filed at 05/27/14 0644  Gross per 24 hour  Intake 2133.33 ml  Output   3100 ml  Net -966.67 ml   Vitals reviewed.  General: sitting up in bed and eating. Is cooperative and interactive. Has pleasant affect. HEENT: PERRL, EOMI, no scleral icterus Cardiac: RRR, no rubs, murmurs or gallops Pulm: clear to auscultation bilaterally, no wheezes, rales, or rhonchi Abd: soft, nontender, nondistended, BS present Ext: warm and well perfused.  Neuro: alert and oriented to self and place. Cranial nerves II-XII grossly intact, strength and sensation to light touch equal in bilateral upper and lower extremities. Movement is slow but able to follow command appropriately.   Lab Results: Basic Metabolic Panel:  Recent Labs Lab 05/24/14 0420 05/26/14 0340  NA 143 141  K 4.0 3.4*  CL 107 103  CO2 25 24  GLUCOSE 106* 95  BUN 11 13  CREATININE 0.76 0.74  CALCIUM 8.9 9.2   Liver Function Tests:  Recent Labs Lab 05/23/14 1420 05/24/14 0420  AST 40* 30  ALT 18 15  ALKPHOS 85 69  BILITOT <0.2* 0.3  PROT 7.8 6.4  ALBUMIN 3.7 2.9*   No results found for this basename: LIPASE, AMYLASE,  in the last 168 hours  Recent Labs Lab  05/23/14 0030  AMMONIA 26   CBC:  Recent Labs Lab 05/23/14 1420 05/23/14 1428 05/24/14 0420  WBC 9.8  --  6.8  NEUTROABS 4.8  --   --   HGB 14.3 16.0* 12.0  HCT 41.7 47.0* 36.8  MCV 91.9  --  93.9  PLT 243  --  225  CBG:  Recent Labs Lab 05/23/14 1420  GLUCAP 111*  Coagulation:  Recent Labs Lab 05/23/14 1420  LABPROT 14.0  INR 1.08  Urine Drug Screen: Drugs of Abuse     Component Value Date/Time   LABOPIA NONE DETECTED 05/23/2014 1540   LABOPIA NEG 08/01/2008 2209   COCAINSCRNUR NONE DETECTED 05/23/2014 1540   COCAINSCRNUR NEG 08/01/2008 2209   LABBENZ POSITIVE* 05/23/2014 1540   LABBENZ NEG 08/01/2008 2209   AMPHETMU NONE DETECTED 05/23/2014 1540   AMPHETMU NEG 08/01/2008 2209   THCU NONE DETECTED 05/23/2014 1540   LABBARB NONE DETECTED 05/23/2014 1540    Alcohol Level:  Recent Labs Lab 05/23/14 1735  ETH <11   Urinalysis:  Recent Labs Lab 05/23/14 1540  COLORURINE YELLOW  LABSPEC 1.020  PHURINE 7.5  GLUCOSEU NEGATIVE  HGBUR NEGATIVE  BILIRUBINUR NEGATIVE  KETONESUR NEGATIVE  PROTEINUR 100*  UROBILINOGEN 0.2  NITRITE NEGATIVE  LEUKOCYTESUR NEGATIVE   Misc. Labs:   Micro Results: Recent Results (from the past  240 hour(s))  MRSA PCR SCREENING     Status: None   Collection Time    05/23/14 10:09 PM      Result Value Ref Range Status   MRSA by PCR NEGATIVE  NEGATIVE Final   Comment:            The GeneXpert MRSA Assay (FDA     approved for NASAL specimens     only), is one component of a     comprehensive MRSA colonization     surveillance program. It is not     intended to diagnose MRSA     infection nor to guide or     monitor treatment for     MRSA infections.   Studies/Results: No results found. Medications: I have reviewed the patient's current medications. Scheduled Meds: . benztropine  1 mg Oral BID  . citalopram  40 mg Oral Daily  . enoxaparin (LOVENOX) injection  40 mg Subcutaneous Q24H  . gabapentin  300 mg Oral TID  .  pantoprazole  40 mg Oral Daily  . risperiDONE  1 mg Oral BID   Continuous Infusions: . sodium chloride 100 mL/hr at 05/27/14 0455   PRN Meds:.acetaminophen, acetaminophen, ondansetron (ZOFRAN) IV, ondansetron Assessment/Plan: Principal Problem:   Altered mental status Active Problems:   SUBSTANCE ABUSE, MULTIPLE   HYPERTENSION, BENIGN ESSENTIAL   Schizophrenia, paranoid type   Acute encephalopathy  Acute Encephalopathy - resolved - likely 2/2 benzo overdose. Has high risk given her other mental disorders.  - She was never prescribed benzos. Has been positive in the past. She has hx of benzo abuse so will not reverse it as it can cause seizures.  - she has progressively improved in the past few days, indicating benzo action has likely cleared up from her system. - stroke ruled out with CT head and MRI brain normal.  - infection unlikely given no fever, normal UA, normal CXR. BMP is normal except for mild low K.   Schizophrenia and bipolar  -cont home meds: citalopram, benztropine, risperidone. - holding off risperidol 25mg  IM that she gets biweekly. Was due on 8/7. This may worsen her sedation.  HTN  - slightly elevated. Is not on any med now. Will defer HTN management to PCP  Dispo: Disposition is deferred at this time, awaiting improvement of current medical problems.  Anticipated discharge in approximately 1 day(s).   The patient does not have a current PCP (Dorrene GermanEdwin A Avbuere, MD) and [facility].  The patient does not know have transportation limitations that hinder transportation to clinic appointments.  .Services Needed at time of discharge: Y = Yes, Blank = No PT:   OT:   RN:   Equipment: Cane   Other:     LOS: 4 days   Hyacinth Meekerasrif Aftyn Nott, MD 05/27/2014, 8:29 AM

## 2014-05-27 NOTE — Discharge Instructions (Signed)
It was a pleasure taking care of you. You were admitted to the hospital because of altered mental status. Please follow up with your new primary care doctor when you are discharged.    Altered Mental Status Altered mental status most often refers to an abnormal change in your responsiveness and awareness. It can affect your speech, thought, mobility, memory, attention span, or alertness. It can range from slight confusion to complete unresponsiveness (coma). Altered mental status can be a sign of a serious underlying medical condition. Rapid evaluation and medical treatment is necessary for patients having an altered mental status. CAUSES   Low blood sugar (hypoglycemia) or diabetes.  Severe loss of body fluids (dehydration) or a body salt (electrolyte) imbalance.  A stroke or other neurologic problem, such as dementia or delirium.  A head injury or tumor.  A drug or alcohol overdose.  Exposure to toxins or poisons.  Depression, anxiety, and stress.  A low oxygen level (hypoxia).  An infection.  Blood loss.  Twitching or shaking (seizure).  Heart problems, such as heart attack or heart rhythm problems (arrhythmias).  A body temperature that is too low or too high (hypothermia or hyperthermia). DIAGNOSIS  A diagnosis is based on your history, symptoms, physical and neurologic examinations, and diagnostic tests. Diagnostic tests may include:  Measurement of your blood pressure, pulse, breathing, and oxygen levels (vital signs).  Blood tests.  Urine tests.  X-ray exams.  A computerized magnetic scan (magnetic resonance imaging, MRI).  A computerized X-ray scan (computed tomography, CT scan). TREATMENT  Treatment will depend on the cause. Treatment may include:  Management of an underlying medical or mental health condition.  Critical care or support in the hospital. HOME CARE INSTRUCTIONS   Only take over-the-counter or prescription medicines for pain, discomfort,  or fever as directed by your caregiver.  Manage underlying conditions as directed by your caregiver.  Eat a healthy, well-balanced diet to maintain strength.  Join a support group or prevention program to cope with the condition or trauma that caused the altered mental status. Ask your caregiver to help choose a program that works for you.  Follow up with your caregiver for further examination, therapy, or testing as directed. SEEK MEDICAL CARE IF:   You feel unwell or have chills.  You or your family notice a change in your behavior or your alertness.  You have trouble following your caregiver's treatment plan.  You have questions or concerns. SEEK IMMEDIATE MEDICAL CARE IF:   You have a rapid heartbeat or have chest pain.  You have difficulty breathing.  You have a fever.  You have a headache with a stiff neck.  You cough up blood.  You have blood in your urine or stool.  You have severe agitation or confusion. MAKE SURE YOU:   Understand these instructions.  Will watch your condition.  Will get help right away if you are not doing well or get worse. Document Released: 03/24/2010 Document Revised: 12/27/2011 Document Reviewed: 03/24/2010 [facility] Patient Information 2015 [employer] [place] [place]. This information is not intended to replace advice given to you by your health care provider. Make sure you discuss any questions you have with your health care provider.

## 2014-05-27 NOTE — Progress Notes (Signed)
Paged teaching service. Pt has only one ride home today and they are on their way now. Need a current discharge order.

## 2014-05-28 LAB — GC/CHLAMYDIA PROBE AMP
CT Probe RNA: NEGATIVE
GC Probe RNA: NEGATIVE

## 2016-01-14 DIAGNOSIS — I10 Essential (primary) hypertension: Secondary | ICD-10-CM | POA: Diagnosis not present

## 2016-01-14 DIAGNOSIS — G479 Sleep disorder, unspecified: Secondary | ICD-10-CM | POA: Diagnosis not present

## 2016-01-14 DIAGNOSIS — Z1159 Encounter for screening for other viral diseases: Secondary | ICD-10-CM | POA: Diagnosis not present

## 2016-01-14 DIAGNOSIS — R413 Other amnesia: Secondary | ICD-10-CM | POA: Diagnosis not present

## 2016-01-30 ENCOUNTER — Encounter: Payer: Self-pay | Admitting: Neurology

## 2016-01-30 ENCOUNTER — Ambulatory Visit (INDEPENDENT_AMBULATORY_CARE_PROVIDER_SITE_OTHER): Payer: Medicare Other | Admitting: Neurology

## 2016-01-30 VITALS — BP 108/74 | HR 107 | Ht 59.0 in | Wt 148.5 lb

## 2016-01-30 DIAGNOSIS — R413 Other amnesia: Secondary | ICD-10-CM | POA: Diagnosis not present

## 2016-01-30 HISTORY — DX: Other amnesia: R41.3

## 2016-01-30 MED ORDER — ALPRAZOLAM 0.5 MG PO TABS: ORAL_TABLET | ORAL | Status: DC

## 2016-01-30 NOTE — Progress Notes (Signed)
Reason for visit: Memory disturbance  Referring physician: Dr. Vicenta [name] is a 65 y.o. female  History of present illness:  Danielle [name] is a 65 year old right-handed black female with a history of schizophrenia and a lifelong issue with a low functioning level. The patient lives alone, but family members around her do much of her day-to-day management of financial and legal affairs. The patient indicates that she never learned how to drive. The patient does not manage her finances, her brother does this for her. She tries to manage her own medications and appointments, but she has some difficulty doing this. She has a history of polysubstance abuse, but she indicates that she stopped using drugs over a year ago. She was using crack cocaine and smoking marijuana. She has a history of IV drug use in the distant past. She recently had blood work done through her primary care doctor to include a thyroid profile, RPR, vitamin B12 level, and a hepatitis C screen. The patient indicates that she was told that she had hepatitis C, but the blood work are not available to me. The patient has some issues with short-term memory, she will misplace things about the house frequently. She has difficulty with her memory of names for people. She does report some insomnia issues, and difficulty with sleeping. She may have to get up to 3 times to go to the bathroom at night. She has noted some tingling in her feet as well, she denies any significant balance issues or difficulty controlling the bowels or the bladder. She denies any family history of dementia. She is sent to this office for an evaluation. MRI of the brain done in 2015 showed a moderate level small vessel disease.  Past Medical History  Diagnosis Date  . Hypertension   . Asthma   . Depression   . Bipolar affective disorder, currently depressed, mild (HCC)   . Stroke (HCC)   . Coronary artery disease   . Tardive dyskinesia   . Fall  08/25/2012    Recent fall with residual knee and back pain.  . Complication of anesthesia     " My eyes swell up "  . Family history of anesthesia complication     " [name] "  . Memory disturbance 01/30/2016    Past Surgical History  Procedure Laterality Date  . Abdominal hysterectomy    . Cardiac catheterization      Family History  Problem Relation Age of Onset  . Hypertension Mother   . Stroke Mother   . Heart attack Mother   . Heart attack Father   . Heart attack Brother   . Dementia Neg Hx   . Heart attack Sister     Social history:  reports that she quit smoking about 3 years ago. Her smoking use included Cigarettes. She has never used smokeless tobacco. She reports that she drinks alcohol. She reports that she does not use illicit drugs.  Medications:  Prior to Admission medications   Medication Sig Start Date End Date Taking? Authorizing Provider  albuterol (PROAIR HFA) 108 (90 Base) MCG/ACT inhaler Inhale into the lungs. 08/28/14  Yes Historical Provider, MD  amLODipine (NORVASC) 5 MG tablet  01/15/16  Yes Historical Provider, MD  benztropine (COGENTIN) 1 MG tablet Take 1 mg by mouth 2 (two) times daily.   Yes Historical Provider, MD  chlorthalidone (HYGROTON) 25 MG tablet Take 25 mg by mouth. 06/10/15  Yes Historical Provider, MD  citalopram (CELEXA) 40  MG tablet Take 0.5 tablets (20 mg total) by mouth daily. 05/27/14  Yes Tasrif Ahmed, MD  gabapentin (NEURONTIN) 300 MG capsule Take 100 mg by mouth 2 (two) times daily.    Yes Historical Provider, MD  mirtazapine (REMERON) 30 MG tablet  01/15/16  Yes Historical Provider, MD  omeprazole (PRILOSEC) 20 MG capsule Take 20 mg by mouth daily as needed (acid reflux).   Yes Historical Provider, MD  potassium chloride SA (K-DUR,KLOR-CON) 20 MEQ tablet  11/17/15  Yes Historical Provider, MD  risperiDONE (RISPERDAL) 2 MG tablet  01/15/16  Yes Historical Provider, MD  traZODone (DESYREL) 100 MG tablet Take 200 mg by mouth at  bedtime.   Yes Historical Provider, MD      Allergies  Allergen Reactions  . Clonazepam Swelling    Pt was recently given a triamterene hydrochlorothiazide combination as well as clonazepam and had an allergic reaction to one of them. Caused swelling of face and eyes  . Doxycycline Swelling    Swelling of face and eyes  . Fosinopril Sodium Swelling     swelling face,lips-angioedema from ACE I  . Hydrochlorothiazide W-Triamterene Swelling    Pt was recently given a triamterene hydrochlorothiazide combination as well as clonazepam and had an allergic reaction to one of them. Swelling of face and eyes  . Penicillins Swelling    Face and eyes  . Sulfa Antibiotics     ROS:  Out of a complete 14 system review of symptoms, the patient complains only of the following symptoms, and all other reviewed systems are negative.  Heart murmur Shortness of breath, wheezing Diarrhea Memory loss, headache, dizziness sleepiness, snoring   Blood pressure 108/74, pulse 107, height 4\' 11"  (1.499 m), weight 148 lb 8 oz (67.359 kg).  Physical Exam  General: The patient is alert and cooperative at the time of the examination.  Eyes: Pupils are equal, round, and reactive to light. Discs are flat bilaterally.  Neck: The neck is supple, no carotid bruits are noted.  Respiratory: The respiratory examination is clear.  Cardiovascular: The cardiovascular examination reveals a regular rate and rhythm, no obvious murmurs or rubs are noted.  Skin: Extremities are without significant edema.  Neurologic Exam  Mental status: The patient is alert and oriented x 3 at the time of the examination. The Mini-Mental Status Examination done today shows a total score of 25/30.  Cranial nerves: Facial symmetry is present. There is good sensation of the face to pinprick and soft touch bilaterally. The strength of the facial muscles and the muscles to head turning and shoulder shrug are normal bilaterally. Speech  is well enunciated, no aphasia or dysarthria is noted. Extraocular movements are full. Visual fields are full. The tongue is midline, and the patient has symmetric elevation of the soft palate. No obvious hearing deficits are noted.  Motor: The motor testing reveals 5 over 5 strength of all 4 extremities. Good symmetric motor tone is noted throughout.  Sensory: Sensory testing is intact to pinprick, soft touch, vibration sensation, and position sense on the upper extremities. With the lower extremities, the patient has a stocking pattern pinprick sensory deficit in the distal two thirds of the legs bilaterally. Vibration sensation is symmetric, but position sensation is decreased in both feet.  No evidence of extinction is noted.  Coordination: Cerebellar testing reveals good finger-nose-finger and heel-to-shin bilaterally.  Gait and station: Gait is normal. Tandem gait is normal. Romberg is negative. No drift is seen.  Reflexes: Deep tendon reflexes are  symmetric and normal bilaterally. Toes are downgoing bilaterally.   MRI brain 05/23/14:  IMPRESSION: Motion degraded exam demonstrating no acute intracranial findings. Mild to moderate small vessel disease.  * MRI scan images were reviewed online. I agree with the written report.    Assessment/Plan:  1. Memory disturbance   2. Schizophrenia   3. History of polysubstance abuse   The patient has a history of crack cocaine use and IV drug abuse. The patient will be sent for further blood work to include an HIV antibody test, and she will be sent for a repeat MRI of the brain. The patient will follow-up in 6-8 weeks, if the above studies are relatively unremarkable, we will consider initiating Aricept for her memory disturbance. Schizophrenic patients may oftentimes develop a progressive dementing illness as they get older.   Marlan Palau MD 02/01/2016 8:09 PM  [facility] [address] [place],  [place] [address]  Phone [phone] Fax [phone]

## 2016-02-01 LAB — SEDIMENTATION RATE: Sed Rate: 32 mm/hr (ref 0–40)

## 2016-02-01 LAB — COPPER, SERUM: Copper: 120 ug/dL (ref 72–166)

## 2016-02-01 LAB — HIV ANTIBODY (ROUTINE TESTING W REFLEX): HIV Screen 4th Generation wRfx: NONREACTIVE

## 2016-02-02 ENCOUNTER — Telehealth: Payer: Self-pay | Admitting: *Deleted

## 2016-02-02 NOTE — Telephone Encounter (Signed)
-----   Message from York Spanielharles K Willis, MD sent at 02/01/2016  7:55 PM EDT -----  The blood work results are unremarkable. Please call the patient.  ----- Message -----    From: [facility] Lab Results In Interface    Sent: 01/31/2016   7:40 AM      To: York Spanielharles K Willis, MD

## 2016-02-02 NOTE — Telephone Encounter (Signed)
Called and spoke to pt about lab results per Dr Anne HahnWillis note. Pt verbalized understanding.

## 2016-03-30 ENCOUNTER — Ambulatory Visit: Payer: Medicare Other | Admitting: Adult Health

## 2016-03-31 ENCOUNTER — Encounter: Payer: Self-pay | Admitting: Adult Health

## 2016-04-30 DIAGNOSIS — R202 Paresthesia of skin: Secondary | ICD-10-CM | POA: Diagnosis not present

## 2016-04-30 DIAGNOSIS — I10 Essential (primary) hypertension: Secondary | ICD-10-CM | POA: Diagnosis not present

## 2016-05-24 DIAGNOSIS — Z1211 Encounter for screening for malignant neoplasm of colon: Secondary | ICD-10-CM | POA: Diagnosis not present

## 2016-05-24 DIAGNOSIS — R768 Other specified abnormal immunological findings in serum: Secondary | ICD-10-CM | POA: Diagnosis not present

## 2016-05-24 DIAGNOSIS — R7303 Prediabetes: Secondary | ICD-10-CM | POA: Diagnosis not present

## 2016-05-24 DIAGNOSIS — M545 Low back pain: Secondary | ICD-10-CM | POA: Diagnosis not present

## 2016-05-24 DIAGNOSIS — B1920 Unspecified viral hepatitis C without hepatic coma: Secondary | ICD-10-CM | POA: Diagnosis not present

## 2016-05-24 DIAGNOSIS — R200 Anesthesia of skin: Secondary | ICD-10-CM | POA: Diagnosis not present

## 2016-10-27 DIAGNOSIS — Z23 Encounter for immunization: Secondary | ICD-10-CM | POA: Diagnosis not present

## 2017-03-16 DIAGNOSIS — G2401 Drug induced subacute dyskinesia: Secondary | ICD-10-CM | POA: Diagnosis not present

## 2017-03-16 DIAGNOSIS — F200 Paranoid schizophrenia: Secondary | ICD-10-CM | POA: Diagnosis not present

## 2017-03-16 DIAGNOSIS — E871 Hypo-osmolality and hyponatremia: Secondary | ICD-10-CM | POA: Diagnosis not present

## 2017-03-16 DIAGNOSIS — N179 Acute kidney failure, unspecified: Secondary | ICD-10-CM | POA: Diagnosis not present

## 2017-03-16 DIAGNOSIS — E876 Hypokalemia: Secondary | ICD-10-CM | POA: Diagnosis not present

## 2017-03-16 DIAGNOSIS — G2402 Drug induced acute dystonia: Secondary | ICD-10-CM | POA: Diagnosis not present

## 2017-03-16 DIAGNOSIS — K219 Gastro-esophageal reflux disease without esophagitis: Secondary | ICD-10-CM | POA: Diagnosis not present

## 2017-03-16 DIAGNOSIS — Z87898 Personal history of other specified conditions: Secondary | ICD-10-CM | POA: Diagnosis not present

## 2017-03-16 DIAGNOSIS — I10 Essential (primary) hypertension: Secondary | ICD-10-CM | POA: Diagnosis not present

## 2017-03-17 DIAGNOSIS — E876 Hypokalemia: Secondary | ICD-10-CM | POA: Diagnosis not present

## 2017-04-04 ENCOUNTER — Encounter (HOSPITAL_BASED_OUTPATIENT_CLINIC_OR_DEPARTMENT_OTHER): Payer: Self-pay | Admitting: *Deleted

## 2017-04-04 ENCOUNTER — Emergency Department (HOSPITAL_BASED_OUTPATIENT_CLINIC_OR_DEPARTMENT_OTHER)
Admission: EM | Admit: 2017-04-04 | Discharge: 2017-04-04 | Disposition: A | Discharge status: Home / Self Care | Payer: Medicare Other | Attending: Emergency Medicine | Admitting: Emergency Medicine

## 2017-04-04 ENCOUNTER — Emergency Department (HOSPITAL_BASED_OUTPATIENT_CLINIC_OR_DEPARTMENT_OTHER): Payer: Medicare Other

## 2017-04-04 DIAGNOSIS — Z79899 Other long term (current) drug therapy: Secondary | ICD-10-CM | POA: Insufficient documentation

## 2017-04-04 DIAGNOSIS — J189 Pneumonia, unspecified organism: Secondary | ICD-10-CM

## 2017-04-04 DIAGNOSIS — J45909 Unspecified asthma, uncomplicated: Secondary | ICD-10-CM | POA: Insufficient documentation

## 2017-04-04 DIAGNOSIS — I10 Essential (primary) hypertension: Secondary | ICD-10-CM | POA: Insufficient documentation

## 2017-04-04 DIAGNOSIS — R0602 Shortness of breath: Secondary | ICD-10-CM | POA: Diagnosis not present

## 2017-04-04 DIAGNOSIS — Z87891 Personal history of nicotine dependence: Secondary | ICD-10-CM | POA: Insufficient documentation

## 2017-04-04 DIAGNOSIS — I2510 Atherosclerotic heart disease of native coronary artery without angina pectoris: Secondary | ICD-10-CM | POA: Insufficient documentation

## 2017-04-04 DIAGNOSIS — I119 Hypertensive heart disease without heart failure: Secondary | ICD-10-CM | POA: Diagnosis not present

## 2017-04-04 DIAGNOSIS — R05 Cough: Secondary | ICD-10-CM | POA: Diagnosis present

## 2017-04-04 DIAGNOSIS — J181 Lobar pneumonia, unspecified organism: Secondary | ICD-10-CM | POA: Diagnosis not present

## 2017-04-04 LAB — TROPONIN I: Troponin I: <0.03 ng/mL (ref <0.03)

## 2017-04-04 LAB — CBC WITH DIFFERENTIAL/PLATELET
Basophils Absolute: 0 10*3/uL (ref 0.0–0.1)
Basophils Relative: 0 %
Eosinophils Absolute: 0.2 10*3/uL (ref 0.0–0.7)
Eosinophils Relative: 3 %
HCT: 40.5 % (ref 36.0–46.0)
Hemoglobin: 14.1 g/dL (ref 12.0–15.0)
Lymphocytes Relative: 35 %
Lymphs Abs: 2.8 10*3/uL (ref 0.7–4.0)
MCH: 31.5 pg (ref 26.0–34.0)
MCHC: 34.8 g/dL (ref 30.0–36.0)
MCV: 90.4 fL (ref 78.0–100.0)
Monocytes Absolute: 1 10*3/uL (ref 0.1–1.0)
Monocytes Relative: 12 %
Neutro Abs: 4.1 10*3/uL (ref 1.7–7.7)
Neutrophils Relative %: 50 %
Platelets: 212 10*3/uL (ref 150–400)
RBC: 4.48 MIL/uL (ref 3.87–5.11)
RDW: 13.1 % (ref 11.5–15.5)
WBC: 8.1 10*3/uL (ref 4.0–10.5)

## 2017-04-04 LAB — BASIC METABOLIC PANEL
Anion gap: 12 (ref 5–15)
BUN: 19 mg/dL (ref 6–20)
CO2: 26 mmol/L (ref 22–32)
Calcium: 9.8 mg/dL (ref 8.9–10.3)
Chloride: 100 mmol/L — ABNORMAL LOW (ref 101–111)
Creatinine, Ser: 1.03 mg/dL — ABNORMAL HIGH (ref 0.44–1.00)
GFR calc Af Amer: >60 mL/min (ref >60)
GFR calc non Af Amer: 57 mL/min — ABNORMAL LOW (ref >60)
Glucose, Bld: 90 mg/dL (ref 65–99)
Potassium: 3.2 mmol/L — ABNORMAL LOW (ref 3.5–5.1)
Sodium: 138 mmol/L (ref 135–145)

## 2017-04-04 MED ORDER — BENZONATATE 100 MG PO CAPS
200.0000 mg | ORAL_CAPSULE | Freq: Once | ORAL | Status: AC
  Administered 2017-04-04: 200 mg via ORAL
  Filled 2017-04-04: qty 2

## 2017-04-04 MED ORDER — AZITHROMYCIN 250 MG PO TABS: 250.0000 mg | ORAL_TABLET | Freq: Every day | ORAL | 0 refills | Status: DC

## 2017-04-04 MED ORDER — KETOROLAC TROMETHAMINE 30 MG/ML IJ SOLN
30.0000 mg | Freq: Once | INTRAMUSCULAR | Status: DC
Start: 2017-04-04 — End: 2017-04-04
  Filled 2017-04-04: qty 1

## 2017-04-04 MED ORDER — ALBUTEROL SULFATE (2.5 MG/3ML) 0.083% IN NEBU
2.5000 mg | INHALATION_SOLUTION | Freq: Once | RESPIRATORY_TRACT | Status: AC
  Administered 2017-04-04: 2.5 mg via RESPIRATORY_TRACT
  Filled 2017-04-04: qty 3

## 2017-04-04 MED ORDER — DOXYCYCLINE HYCLATE 100 MG PO TABS
100.0000 mg | ORAL_TABLET | Freq: Once | ORAL | Status: AC
  Administered 2017-04-04: 100 mg via ORAL
  Filled 2017-04-04: qty 1

## 2017-04-04 MED ORDER — BENZONATATE 100 MG PO CAPS: 100.0000 mg | ORAL_CAPSULE | Freq: Three times a day (TID) | ORAL | 0 refills | Status: DC

## 2017-04-04 MED ORDER — KETOROLAC TROMETHAMINE 60 MG/2ML IM SOLN
30.0000 mg | Freq: Once | INTRAMUSCULAR | Status: DC
  Administered 2017-04-04: 30 mg via INTRAMUSCULAR

## 2017-04-04 MED ORDER — SODIUM CHLORIDE 0.9 % IV BOLUS (SEPSIS): 500.0000 mL | Freq: Once | INTRAVENOUS | Status: DC

## 2017-04-04 NOTE — ED Notes (Signed)
Patient transported to X-ray 

## 2017-04-04 NOTE — ED Notes (Signed)
ED Provider at bedside. 

## 2017-04-04 NOTE — ED Triage Notes (Signed)
Cough x 3 days. Feels sob. She is coming [facility] for a week.

## 2017-04-04 NOTE — ED Provider Notes (Signed)
[facility] Provider Note   CSN: [id number] Arrival date & time: 04/04/17  1227     History   Chief Complaint Chief Complaint  Patient presents with  . Shortness of Breath    HPI Danielle [name] is a 66 y.o. female. Chief complaint is cough  HPI 66 year old female. She is at day [facility] for substance abuse treatment. She has been there for over 1 week. She has had a cough for the last 2 days. States it hurts her chest when she coughs and she has yellow sputum. She states she has felt "hot" she is not describing chills riders. Became concerned about her cough and presents here.  Past Medical History:  Diagnosis Date  . Asthma   . Bipolar affective disorder, currently depressed, mild (HCC)   . Complication of anesthesia    " My eyes swell up "  . Coronary artery disease   . Depression   . Fall 08/25/2012   Recent fall with residual knee and back pain.  . Family history of anesthesia complication    " [name] "  . Hypertension   . Memory disturbance 01/30/2016  . Stroke (HCC)   . Tardive dyskinesia     Patient Active Problem List   Diagnosis Date Noted  . Memory disturbance 01/30/2016  . Acute encephalopathy 05/23/2014  . Altered mental status 12/08/2013  . Schizophrenia, paranoid type (HCC) 04/28/2012  . Cocaine abuse 04/28/2012  . Cannabis abuse 04/28/2012  . Substance induced mood disorder (HCC) 04/28/2012  . Noncompliance 09/16/2011  . Anxiety 09/16/2011  . Constipation 09/16/2011  . Chest pain 09/14/2011  . SUBSTANCE ABUSE, MULTIPLE 12/03/2008  . HYPERLIPIDEMIA 08/01/2008  . MIGRAINE HEADACHE 08/01/2008  . ALLERGIC RHINITIS WITH CONJUNCTIVITIS 08/01/2008  . ASTHMA 08/01/2008  . DEPRESSION 06/20/2008  . URI 06/20/2008  . ANXIETY DISORDER, GENERALIZED 05/28/2008  . HYPERTENSION, BENIGN ESSENTIAL 05/28/2008  . GERD 05/28/2008  . BACTERIAL VAGINITIS 05/24/2008  . DEEP VENOUS THROMBOPHLEBITIS, LEFT, LEG, HX OF 10/18/1988    Past Surgical  History:  Procedure Laterality Date  . ABDOMINAL HYSTERECTOMY    . CARDIAC CATHETERIZATION      OB History    No data available       Home Medications    Prior to Admission medications   Medication Sig Start Date End Date Taking? Authorizing Provider  albuterol (PROAIR HFA) 108 (90 Base) MCG/ACT inhaler Inhale into the lungs. 08/28/14  Yes [provider]  amLODipine (NORVASC) 5 MG tablet  01/15/16  Yes [provider]  omeprazole (PRILOSEC) 20 MG capsule Take 20 mg by mouth daily as needed (acid reflux).   Yes [provider]  ALPRAZolam Prudy Feeler) 0.5 MG tablet Take 2 tablets approximately 45 minutes prior to the MRI study, take a third tablet if needed. 01/30/16   York Spaniel, MD  azithromycin (ZITHROMAX) 250 MG tablet Take 1 tablet (250 mg total) by mouth daily. Take first 2 tablets together, then 1 every day until finished. 04/04/17   Rolland Porter, MD  benzonatate (TESSALON) 100 MG capsule Take 1 capsule (100 mg total) by mouth every 8 (eight) hours. 04/04/17   Rolland Porter, MD  benztropine (COGENTIN) 1 MG tablet Take 1 mg by mouth 2 (two) times daily.    [provider]  chlorthalidone (HYGROTON) 25 MG tablet Take 25 mg by mouth. 06/10/15   [provider]  citalopram (CELEXA) 40 MG tablet Take 0.5 tablets (20 mg total) by mouth daily. 05/27/14   Ahmed, Elyn Peers,  MD  gabapentin (NEURONTIN) 300 MG capsule Take 100 mg by mouth 2 (two) times daily.     [provider]  mirtazapine (REMERON) 30 MG tablet  01/15/16   [provider]  potassium chloride SA (K-DUR,KLOR-CON) 20 MEQ tablet  11/17/15   [provider]  risperiDONE (RISPERDAL) 2 MG tablet  01/15/16   [provider]  traZODone (DESYREL) 100 MG tablet Take 200 mg by mouth at bedtime.    [provider]    Family History Family History  Problem Relation Age of Onset  . Hypertension Mother   . Stroke Mother   . Heart attack Mother   . Heart  attack Father   . Heart attack Brother   . Heart attack Sister   . Dementia Neg Hx     Social History Social History  Substance Use Topics  . Smoking status: Former Smoker    Types: Cigarettes    Quit date: 07/13/2012  . Smokeless tobacco: Never Used  . Alcohol use 0.0 oz/week     Comment: About 1 qt per week     Allergies   Clonazepam; Doxycycline; Fosinopril sodium; Hydrochlorothiazide w-triamterene; Penicillins; and Sulfa antibiotics   Review of Systems Review of Systems  Constitutional: Positive for fatigue. Negative for appetite change, chills, diaphoresis and fever.  HENT: Negative for mouth sores, sore throat and trouble swallowing.   Eyes: Negative for visual disturbance.  Respiratory: Positive for cough. Negative for chest tightness, shortness of breath and wheezing.   Cardiovascular: Negative for chest pain.  Gastrointestinal: Negative for abdominal distention, abdominal pain, diarrhea, nausea and vomiting.  Endocrine: Negative for polydipsia, polyphagia and polyuria.  Genitourinary: Negative for dysuria, frequency and hematuria.  Musculoskeletal: Negative for gait problem.  Skin: Negative for color change, pallor and rash.  Neurological: Negative for dizziness, syncope, light-headedness and headaches.  Hematological: Does not bruise/bleed easily.  Psychiatric/Behavioral: Negative for behavioral problems and confusion.     Physical Exam Updated Vital Signs BP 127/83 (BP Location: Right Arm)   Pulse (!) 102   Temp 98.5 F (36.9 C) (Oral)   Resp 18   Ht 5\' 1"  (1.549 m)   Wt 52.2 kg (115 lb)   SpO2 96%   BMI 21.73 kg/m   Physical Exam  Constitutional: She is oriented to person, place, and time. She appears well-developed and well-nourished. No distress.  HENT:  Head: Normocephalic.  Eyes: Conjunctivae are normal. Pupils are equal, round, and reactive to light. No scleral icterus.  Neck: Normal range of motion. Neck supple. No thyromegaly present.    Cardiovascular: Normal rate and regular rhythm.  Exam reveals no gallop and no friction rub.   No murmur heard. Pulmonary/Chest: Effort normal and breath sounds normal. No respiratory distress. She has no wheezes. She has no rales.  No marked increased work of breathing. Rhonchi, right greater than left.  Abdominal: Soft. Bowel sounds are normal. She exhibits no distension. There is no tenderness. There is no rebound.  Musculoskeletal: Normal range of motion.  Neurological: She is alert and oriented to person, place, and time.  Skin: Skin is warm and dry. No rash noted.  Psychiatric: She has a normal mood and affect. Her behavior is normal.     ED Treatments / Results  Labs (all labs ordered are listed, but only abnormal results are displayed) Labs Reviewed  BASIC METABOLIC PANEL - Abnormal; Notable for the following:       Result Value   Potassium 3.2 (*)  Chloride 100 (*)    Creatinine, Ser 1.03 (*)    GFR calc non Af Amer 57 (*)    All other components within normal limits  CBC WITH DIFFERENTIAL/PLATELET  TROPONIN I    EKG  EKG Interpretation None       Radiology Dg Chest 2 View  Result Date: 04/04/2017 CLINICAL DATA:  Shortness of breath. EXAM: CHEST  2 VIEW COMPARISON:  05/23/2014. FINDINGS: Mediastinum and hilar structures are normal. Mild right mid lung field infiltrate. No pleural effusion or pneumothorax. Heart size normal. Degenerative changes thoracic spine. IMPRESSION: 1.  Mild right mid lung field infiltrate. 2. Cardiomegaly, no pulmonary venous congestion. Electronically Signed   By: Maisie Fus  Register   On: 04/04/2017 13:35    Procedures Procedures (including critical care time)  Medications Ordered in ED Medications  albuterol (PROVENTIL) (2.5 MG/3ML) 0.083% nebulizer solution 2.5 mg (2.5 mg Nebulization Given 04/04/17 1334)  doxycycline (VIBRA-TABS) tablet 100 mg (100 mg Oral Given 04/04/17 1447)  benzonatate (TESSALON) capsule 200 mg (200 mg Oral Given  04/04/17 1447)     Initial Impression / Assessment and Plan / ED Course  I have reviewed the triage vital signs and the nursing notes.  Pertinent labs & imaging results that were available during my care of the patient were reviewed by me and considered in my medical decision making (see chart for details).   chest x-ray shows right midlung infiltrate. She is oxygenating well. Heart rate improves. No clinical signs or symptoms to suggest DVT. She is not a smoker. She is not on hormones. No recent prolonged immobilization cast splints fractures surgeries. She has no leg swelling. Her discomfort is only with cough. It is bilateral subcostal not localized. Plan will be doxycycline, Tessalon. Amer care follow-up. ER as needed.  Final Clinical Impressions(s) / ED Diagnoses   Final diagnoses:  Community acquired pneumonia of right upper lobe of lung (HCC)    New Prescriptions New Prescriptions   AZITHROMYCIN (ZITHROMAX) 250 MG TABLET    Take 1 tablet (250 mg total) by mouth daily. Take first 2 tablets together, then 1 every day until finished.   BENZONATATE (TESSALON) 100 MG CAPSULE    Take 1 capsule (100 mg total) by mouth every 8 (eight) hours.     Rolland Porter, MD 04/04/17 [phone]

## 2017-04-04 NOTE — Discharge Instructions (Signed)
Prescription for Zithromax 5 days. Follow-up with your primary care physician as needed.

## 2017-06-23 DIAGNOSIS — Z23 Encounter for immunization: Secondary | ICD-10-CM | POA: Diagnosis not present

## 2017-07-14 ENCOUNTER — Encounter (HOSPITAL_COMMUNITY): Payer: Self-pay

## 2017-07-14 ENCOUNTER — Emergency Department (HOSPITAL_COMMUNITY)
Admission: EM | Admit: 2017-07-14 | Discharge: 2017-07-14 | Disposition: A | Discharge status: Home / Self Care | Payer: Medicare Other | Attending: Emergency Medicine | Admitting: Emergency Medicine

## 2017-07-14 DIAGNOSIS — M79674 Pain in right toe(s): Secondary | ICD-10-CM

## 2017-07-14 DIAGNOSIS — J45909 Unspecified asthma, uncomplicated: Secondary | ICD-10-CM | POA: Diagnosis not present

## 2017-07-14 DIAGNOSIS — I2510 Atherosclerotic heart disease of native coronary artery without angina pectoris: Secondary | ICD-10-CM | POA: Insufficient documentation

## 2017-07-14 DIAGNOSIS — Z87891 Personal history of nicotine dependence: Secondary | ICD-10-CM | POA: Diagnosis not present

## 2017-07-14 DIAGNOSIS — I10 Essential (primary) hypertension: Secondary | ICD-10-CM | POA: Insufficient documentation

## 2017-07-14 DIAGNOSIS — Z79899 Other long term (current) drug therapy: Secondary | ICD-10-CM | POA: Diagnosis not present

## 2017-07-14 MED ORDER — HYDROCODONE-ACETAMINOPHEN 5-325 MG PO TABS: 1.0000 | ORAL_TABLET | ORAL | 0 refills | Status: DC | PRN

## 2017-07-14 MED ORDER — PREDNISONE 20 MG PO TABS: ORAL_TABLET | ORAL | 0 refills | Status: DC

## 2017-07-14 MED ORDER — PREDNISONE 20 MG PO TABS
60.0000 mg | ORAL_TABLET | Freq: Once | ORAL | Status: AC
  Administered 2017-07-14: 60 mg via ORAL
  Filled 2017-07-14: qty 3

## 2017-07-14 MED ORDER — OXYCODONE-ACETAMINOPHEN 5-325 MG PO TABS
1.0000 | ORAL_TABLET | Freq: Once | ORAL | Status: AC
  Administered 2017-07-14: 1 via ORAL
  Filled 2017-07-14: qty 1

## 2017-07-14 NOTE — ED Provider Notes (Signed)
[facility] Provider Note   CSN: [id number] date & time: 07/14/17  8657     History   Chief Complaint Chief Complaint  Patient presents with  . Foot Pain    HPI Jahnaya Branscome is a 66 y.o. female.  The history is provided by the patient and medical records.  Foot Pain     66 year old female with history of asthma, bipolar disorder, coronary artery disease, hypertension, prior stroke, presenting to the ED with right great toe pain.  States this has been building over the past 2 weeks, but got worse last night.  States she tried warm soaks at home and elevating her feet without relief.  Denies any injury, trauma recently.  No fever, chills.  States when lying in bed even the sheet touching her toe was hurting.  No hx of gout to her knowledge but most of her family members have had this.  Past Medical History:  Diagnosis Date  . Asthma   . Bipolar affective disorder, currently depressed, mild (HCC)   . Complication of anesthesia    " My eyes swell up "  . Coronary artery disease   . Depression   . Fall 08/25/2012   Recent fall with residual knee and back pain.  . Family history of anesthesia complication    " [name] "  . Hypertension   . Memory disturbance 01/30/2016  . Stroke (HCC)   . Tardive dyskinesia     Patient Active Problem List   Diagnosis Date Noted  . Memory disturbance 01/30/2016  . Acute encephalopathy 05/23/2014  . Altered mental status 12/08/2013  . Schizophrenia, paranoid type (HCC) 04/28/2012  . Cocaine abuse 04/28/2012  . Cannabis abuse 04/28/2012  . Substance induced mood disorder (HCC) 04/28/2012  . Noncompliance 09/16/2011  . Anxiety 09/16/2011  . Constipation 09/16/2011  . Chest pain 09/14/2011  . SUBSTANCE ABUSE, MULTIPLE 12/03/2008  . HYPERLIPIDEMIA 08/01/2008  . MIGRAINE HEADACHE 08/01/2008  . ALLERGIC RHINITIS WITH CONJUNCTIVITIS 08/01/2008  . ASTHMA 08/01/2008  . DEPRESSION 06/20/2008  . URI 06/20/2008  .  ANXIETY DISORDER, GENERALIZED 05/28/2008  . HYPERTENSION, BENIGN ESSENTIAL 05/28/2008  . GERD 05/28/2008  . BACTERIAL VAGINITIS 05/24/2008  . DEEP VENOUS THROMBOPHLEBITIS, LEFT, LEG, HX OF 10/18/1988    Past Surgical History:  Procedure Laterality Date  . ABDOMINAL HYSTERECTOMY    . CARDIAC CATHETERIZATION      OB History    No data available       Home Medications    Prior to Admission medications   Medication Sig Start Date End Date Taking? Authorizing Provider  albuterol (PROAIR HFA) 108 (90 Base) MCG/ACT inhaler Inhale into the lungs. 08/28/14   [provider]  ALPRAZolam Prudy Feeler) 0.5 MG tablet Take 2 tablets approximately 45 minutes prior to the MRI study, take a third tablet if needed. 01/30/16   York Spaniel, MD  amLODipine (NORVASC) 5 MG tablet  01/15/16   [provider]  azithromycin (ZITHROMAX) 250 MG tablet Take 1 tablet (250 mg total) by mouth daily. Take first 2 tablets together, then 1 every day until finished. 04/04/17   Rolland Porter, MD  benzonatate (TESSALON) 100 MG capsule Take 1 capsule (100 mg total) by mouth every 8 (eight) hours. 04/04/17   Rolland Porter, MD  benztropine (COGENTIN) 1 MG tablet Take 1 mg by mouth 2 (two) times daily.    [provider]  chlorthalidone (HYGROTON) 25 MG tablet Take 25 mg by mouth. 06/10/15   [provider]  citalopram (CELEXA) 40 MG tablet Take 0.5 tablets (20 mg total) by mouth daily. 05/27/14   Hyacinth Meeker, MD  gabapentin (NEURONTIN) 300 MG capsule Take 100 mg by mouth 2 (two) times daily.     [provider]  mirtazapine (REMERON) 30 MG tablet  01/15/16   [provider]  omeprazole (PRILOSEC) 20 MG capsule Take 20 mg by mouth daily as needed (acid reflux).    [provider]  potassium chloride SA (K-DUR,KLOR-CON) 20 MEQ tablet  11/17/15   [provider]  risperiDONE (RISPERDAL) 2 MG tablet  01/15/16   [provider]  traZODone (DESYREL) 100 MG  tablet Take 200 mg by mouth at bedtime.    [provider]    Family History Family History  Problem Relation Age of Onset  . Hypertension Mother   . Stroke Mother   . Heart attack Mother   . Heart attack Father   . Heart attack Brother   . Heart attack Sister   . Dementia Neg Hx     Social History Social History  Substance Use Topics  . Smoking status: Former Smoker    Types: Cigarettes    Quit date: 07/13/2012  . Smokeless tobacco: Never Used  . Alcohol use 0.0 oz/week     Comment: About 1 qt per week     Allergies   Clonazepam; Doxycycline; Fosinopril sodium; Hydrochlorothiazide w-triamterene; Penicillins; and Sulfa antibiotics   Review of Systems Review of Systems  Musculoskeletal: Positive for arthralgias.  All other systems reviewed and are negative.    Physical Exam Updated Vital Signs BP 123/60 (BP Location: Right Arm)   Pulse 93   Temp 98.1 F (36.7 C) (Oral)   Resp 20   Wt 63.5 kg (140 lb)   SpO2 97%   BMI 26.45 kg/m   Physical Exam  Constitutional: She is oriented to person, place, and time. She appears well-developed and well-nourished.  HENT:  Head: Normocephalic and atraumatic.  Mouth/Throat: Oropharynx is clear and moist.  Eyes: Pupils are equal, round, and reactive to light. Conjunctivae and EOM are normal.  Neck: Normal range of motion.  Cardiovascular: Normal rate, regular rhythm and normal heart sounds.   Pulmonary/Chest: Effort normal and breath sounds normal.  Abdominal: Soft. Bowel sounds are normal.  Musculoskeletal: Normal range of motion.  Exquisite tenderness of the right great toe MCP joint, even with light touch; there is warmth to touch without overlying erythema or induration; mild swelling; no open wounds/sores; limited flexion of the toe due to pain; remainder of foot is benign  Neurological: She is alert and oriented to person, place, and time.  Skin: Skin is warm and dry.  Psychiatric: She has a normal mood and  affect.  Nursing note and vitals reviewed.    ED Treatments / Results  Labs (all labs ordered are listed, but only abnormal results are displayed) Labs Reviewed - No data to display  EKG  EKG Interpretation None       Radiology No results found.  Procedures Procedures (including critical care time)  Medications Ordered in ED Medications  predniSONE (DELTASONE) tablet 60 mg (60 mg Oral Given 07/14/17 1049)  oxyCODONE-acetaminophen (PERCOCET/ROXICET) 5-325 MG per tablet 1 tablet (1 tablet Oral Given 07/14/17 1049)     Initial Impression / Assessment and Plan / ED Course  I have reviewed the triage vital signs and the nursing notes.  Pertinent labs & imaging results that were available during my care of the patient were reviewed  by me and considered in my medical decision making (see chart for details).  66 y.o. F here with right great toe pain along MCP joint.  Symptoms and physical exam findings are concerning for gout.  No open wounds/sores of the foot, no cellulitic changes.  No signs of trauma or bony deformities suggestive of fracture.  Last SrCr was 1.03 in June.  Will start predinsone, pain meds.  Discussed low purine diet.  Can follow-up with PCP.  Discussed plan with patient, she acknowledged understanding and agreed with plan of care.  Return precautions given for new or worsening symptoms.  Final Clinical Impressions(s) / ED Diagnoses   Final diagnoses:  Great toe pain, right    New Prescriptions Discharge Medication List as of 07/14/2017 10:58 AM    START taking these medications   Details  HYDROcodone-acetaminophen (NORCO/VICODIN) 5-325 MG tablet Take 1 tablet by mouth every 4 (four) hours as needed., Starting Thu 07/14/2017, Print    predniSONE (DELTASONE) 20 MG tablet Take 40 mg by mouth daily for 3 days, then  by mouth daily for 3 days, then  daily for 3 days, Print         Garlon Hatchet, PA-C 07/14/17 1321    Gerhard Munch,  MD 07/14/17 [phone]

## 2017-07-14 NOTE — Discharge Instructions (Signed)
Take the prescribed medication as directed.  Try to follow the low purine diet attached on back. Follow-up with your primary care doctor. Return to the ED for new or worsening symptoms.

## 2017-07-14 NOTE — ED Triage Notes (Signed)
Pt presents to the ed with complaints of right foot pain on the top of her feet around her toes with minimal swelling, denies any injury.

## 2018-03-23 ENCOUNTER — Ambulatory Visit (HOSPITAL_COMMUNITY)
Admission: EM | Admit: 2018-03-23 | Discharge: 2018-03-23 | Disposition: A | Discharge status: Home / Self Care | Payer: Medicare Other | Attending: Family Medicine | Admitting: Family Medicine

## 2018-03-23 ENCOUNTER — Encounter (HOSPITAL_COMMUNITY): Payer: Self-pay | Admitting: Family Medicine

## 2018-03-23 DIAGNOSIS — Z87891 Personal history of nicotine dependence: Secondary | ICD-10-CM | POA: Diagnosis not present

## 2018-03-23 DIAGNOSIS — M542 Cervicalgia: Secondary | ICD-10-CM | POA: Insufficient documentation

## 2018-03-23 DIAGNOSIS — Z79899 Other long term (current) drug therapy: Secondary | ICD-10-CM | POA: Diagnosis not present

## 2018-03-23 DIAGNOSIS — Z113 Encounter for screening for infections with a predominantly sexual mode of transmission: Secondary | ICD-10-CM | POA: Diagnosis not present

## 2018-03-23 DIAGNOSIS — G479 Sleep disorder, unspecified: Secondary | ICD-10-CM

## 2018-03-23 DIAGNOSIS — N898 Other specified noninflammatory disorders of vagina: Secondary | ICD-10-CM | POA: Insufficient documentation

## 2018-03-23 LAB — POCT URINALYSIS DIP (DEVICE)
Bilirubin Urine: NEGATIVE
Glucose, UA: NEGATIVE mg/dL
Hgb urine dipstick: NEGATIVE
Ketones, ur: NEGATIVE mg/dL
Nitrite: NEGATIVE
Protein, ur: NEGATIVE mg/dL
Specific Gravity, Urine: 1.01 (ref 1.005–1.030)
Urobilinogen, UA: 0.2 mg/dL (ref 0.0–1.0)
pH: 6.5 (ref 5.0–8.0)

## 2018-03-23 MED ORDER — METRONIDAZOLE 500 MG PO TABS: 500.0000 mg | ORAL_TABLET | Freq: Two times a day (BID) | ORAL | 0 refills | Status: AC

## 2018-03-23 MED ORDER — CYCLOBENZAPRINE HCL 5 MG PO TABS: 5.0000 mg | ORAL_TABLET | Freq: Every day | ORAL | 0 refills | Status: AC

## 2018-03-23 MED ORDER — MELOXICAM 7.5 MG PO TABS: 7.5000 mg | ORAL_TABLET | Freq: Every day | ORAL | 0 refills | Status: DC

## 2018-03-23 MED ORDER — AZITHROMYCIN 250 MG PO TABS
ORAL_TABLET | ORAL | Status: AC
  Filled 2018-03-23: qty 4

## 2018-03-23 MED ORDER — AZITHROMYCIN 250 MG PO TABS
1000.0000 mg | ORAL_TABLET | Freq: Once | ORAL | Status: AC
  Administered 2018-03-23: 1000 mg via ORAL

## 2018-03-23 MED ORDER — CEFTRIAXONE SODIUM 250 MG IJ SOLR
250.0000 mg | Freq: Once | INTRAMUSCULAR | Status: AC
  Administered 2018-03-23: 250 mg via INTRAMUSCULAR

## 2018-03-23 MED ORDER — CEFTRIAXONE SODIUM 250 MG IJ SOLR
INTRAMUSCULAR | Status: AC
  Filled 2018-03-23: qty 250

## 2018-03-23 NOTE — Discharge Instructions (Addendum)
We have treated you today for gonorrhea and chlamydia, with rocephin and azithromycin. Please refrain from sexual intercourse for 7 days while medicines eliminating infection. Please begin metronidazole tomorrow to treat bacterial vaginosis. Do not drink alcohol while taking.   We are testing you for Gonorrhea, Chlamydia, Trichomonas, Yeast and Bacterial Vaginosis. We will call you if anything is positive and let you know if you require any further treatment. Please inform partners of any positive results.   Please return if symptoms not improving with treatment, development of fever, nausea, vomiting, abdominal pain.   Neck pain:  please take meloxicam daily for neck pain. May take flexeril at bedtime to help with neck pain and sleep.flexeril may cause sedation, do not drive after taking.   Please set up a primary provider to manage your anxiety and insomnia.

## 2018-03-23 NOTE — ED Triage Notes (Signed)
Pt here for yellow vaginal discharge with foul odor. She is wanting STD testing. sts also that she needs something to help her rest at night. Hx of anxiety

## 2018-03-23 NOTE — ED Provider Notes (Signed)
[facility]    CSN: [id number] Arrival date & time: 03/23/18  1455     History   Chief Complaint Chief Complaint  Patient presents with  . Vaginal Discharge    HPI Danielle [name] is a 67 y.o. female history of hypertension, previous stroke, CAD, anxiety and depression presenting today for evaluation of vaginal discharge and neck pain.  Patient states that she has had vaginal discharge for the past 2 weeks.  Notes to be yellow and has a bad odor.  Describes it as fishy.  Has some mild itching.  Denies dysuria.  Does endorse increased frequency.  Denies any pelvic pain, abnormal vaginal bleeding, fever, abdominal pain.  Does have some mild nausea.  Patient states that vaginal discharge began after a sexual encounter, patient is concerned about STDs.  Patient also noting that she has had difficulty sleeping.  States that she has some right-sided neck pain that keeps her up at night as well as her thoughts and worries.  Patient has previously used Xanax which helped her, trazodone which did not help her.  Patient does not take anything for anxiety.  Patient states that her neck pain has been going on for a couple of months.  HPI  Past Medical History:  Diagnosis Date  . Asthma   . Bipolar affective disorder, currently depressed, mild (HCC)   . Complication of anesthesia    " My eyes swell up "  . Coronary artery disease   . Depression   . Fall 08/25/2012   Recent fall with residual knee and back pain.  . Family history of anesthesia complication    " [name] "  . Hypertension   . Memory disturbance 01/30/2016  . Stroke (HCC)   . Tardive dyskinesia     Patient Active Problem List   Diagnosis Date Noted  . Memory disturbance 01/30/2016  . Acute encephalopathy 05/23/2014  . Altered mental status 12/08/2013  . Schizophrenia, paranoid type (HCC) 04/28/2012  . Cocaine abuse (HCC) 04/28/2012  . Cannabis abuse 04/28/2012  . Substance induced mood disorder (HCC)  04/28/2012  . Noncompliance 09/16/2011  . Anxiety 09/16/2011  . Constipation 09/16/2011  . Chest pain 09/14/2011  . SUBSTANCE ABUSE, MULTIPLE 12/03/2008  . HYPERLIPIDEMIA 08/01/2008  . MIGRAINE HEADACHE 08/01/2008  . ALLERGIC RHINITIS WITH CONJUNCTIVITIS 08/01/2008  . ASTHMA 08/01/2008  . DEPRESSION 06/20/2008  . URI 06/20/2008  . ANXIETY DISORDER, GENERALIZED 05/28/2008  . HYPERTENSION, BENIGN ESSENTIAL 05/28/2008  . GERD 05/28/2008  . BACTERIAL VAGINITIS 05/24/2008  . DEEP VENOUS THROMBOPHLEBITIS, LEFT, LEG, HX OF 10/18/1988    Past Surgical History:  Procedure Laterality Date  . ABDOMINAL HYSTERECTOMY    . CARDIAC CATHETERIZATION      OB History   None      Home Medications    Prior to Admission medications   Medication Sig Start Date End Date Taking? Authorizing Provider  albuterol (PROAIR HFA) 108 (90 Base) MCG/ACT inhaler Inhale into the lungs. 08/28/14   [provider]  chlorthalidone (HYGROTON) 25 MG tablet Take 25 mg by mouth. 06/10/15   [provider]  cyclobenzaprine (FLEXERIL) 5 MG tablet Take 1 tablet (5 mg total) by mouth at bedtime for 7 days. 03/23/18 03/30/18  Nikoleta Dady C, PA-C  meloxicam (MOBIC) 7.5 MG tablet Take 1 tablet (7.5 mg total) by mouth daily. 03/23/18   Karnisha Lefebre C, PA-C  metroNIDAZOLE (FLAGYL) 500 MG tablet Take 1 tablet (500 mg total) by mouth 2 (two) times daily for 7  days. 03/23/18 03/30/18  Tiffanie Blassingame C, PA-C  omeprazole (PRILOSEC) 20 MG capsule Take 20 mg by mouth daily as needed (acid reflux).    [provider]    Family History Family History  Problem Relation Age of Onset  . Hypertension Mother   . Stroke Mother   . Heart attack Mother   . Heart attack Father   . Heart attack Brother   . Heart attack Sister   . Dementia Neg Hx     Social History Social History   Tobacco Use  . Smoking status: Former Smoker    Types: Cigarettes    Last attempt to quit: 07/13/2012    Years since  quitting: 5.6  . Smokeless tobacco: Never Used  Substance Use Topics  . Alcohol use: Yes    Alcohol/week: 0.0 oz    Comment: About 1 qt per week  . Drug use: No    Types: Cocaine, Marijuana    Comment: Former user     Allergies   Clonazepam; Doxycycline; Fosinopril sodium; Hydrochlorothiazide w-triamterene; Penicillins; and Sulfa antibiotics   Review of Systems Review of Systems  Constitutional: Negative for fever.  Respiratory: Negative for shortness of breath.   Cardiovascular: Negative for chest pain.  Gastrointestinal: Negative for abdominal pain, diarrhea, nausea and vomiting.  Genitourinary: Positive for frequency and vaginal discharge. Negative for dysuria, flank pain, genital sores, hematuria, menstrual problem, vaginal bleeding and vaginal pain.  Musculoskeletal: Positive for myalgias and neck pain. Negative for back pain.  Skin: Negative for rash.  Neurological: Negative for dizziness, light-headedness and headaches.  Psychiatric/Behavioral: Positive for sleep disturbance. The patient is nervous/anxious.      Physical Exam Triage Vital Signs ED Triage Vitals  Enc Vitals Group     BP 03/23/18 1507 121/83     Pulse Rate 03/23/18 1507 (!) 105     Resp 03/23/18 1507 18     Temp 03/23/18 1507 98.6 F (37 C)     Temp src --      SpO2 03/23/18 1507 100 %     Weight --      Height --      Head Circumference --      Peak Flow --      Pain Score 03/23/18 1504 0     Pain Loc --      Pain Edu? --      Excl. in GC? --    No data found.  Updated Vital Signs BP 121/83   Pulse (!) 105   Temp 98.6 F (37 C)   Resp 18   SpO2 100%   Visual Acuity Right Eye Distance:   Left Eye Distance:   Bilateral Distance:    Right Eye Near:   Left Eye Near:    Bilateral Near:     Physical Exam  Constitutional: She is oriented to person, place, and time. She appears well-developed and well-nourished. No distress.  HENT:  Head: Normocephalic and atraumatic.    Mouth/Throat: Oropharynx is clear and moist.  Poor dentition  Eyes: Pupils are equal, round, and reactive to light. Conjunctivae and EOM are normal.  Neck: Neck supple.  Cardiovascular: Normal rate and regular rhythm.  No murmur heard. Pulmonary/Chest: Effort normal and breath sounds normal. No respiratory distress.  Abdominal: Soft. There is no tenderness.  Nontender to light and deep palpation throughout all 4 quadrants and epigastrium  Genitourinary:  Genitourinary Comments: Normal external female genitalia, moderate amount of yellow discharge with odor in vaginal vault, cervix  nonerythematous.  Musculoskeletal: She exhibits no edema.  Neurological: She is alert and oriented to person, place, and time.  Skin: Skin is warm and dry.  Psychiatric: She has a normal mood and affect.  Nursing note and vitals reviewed.    UC Treatments / Results  Labs (all labs ordered are listed, but only abnormal results are displayed) Labs Reviewed  POCT URINALYSIS DIP (DEVICE) - Abnormal; Notable for the following components:      Result Value   Leukocytes, UA TRACE (*)    All other components within normal limits  CERVICOVAGINAL ANCILLARY ONLY    EKG None  Radiology No results found.  Procedures Procedures (including critical care time)  Medications Ordered in UC Medications  cefTRIAXone (ROCEPHIN) injection 250 mg (250 mg Intramuscular Given 03/23/18 1537)  azithromycin (ZITHROMAX) tablet 1,000 mg (1,000 mg Oral Given 03/23/18 1537)    Initial Impression / Assessment and Plan / UC Course  I have reviewed the triage vital signs and the nursing notes.  Pertinent labs & imaging results that were available during my care of the patient were reviewed by me and considered in my medical decision making (see chart for details).     Vaginal discharge likely bacterial vaginosis, vaginal swab obtained and will send off to check for STDs/yeast/BV.  Patient requesting empiric treatment of STDs,  will provide Rocephin and azithromycin.  We will have her start metronidazole tomorrow.  UA unremarkable.  Patient having neck pain, will provide Mobic 7.5 mg daily, provided Flexeril 5 mg to use at bedtime, advised that this does causes sedation may help her with sleeping.  Advised her to get set up with a PCP for further evaluation and management of anxiety and insomnia.  Discussed strict return precautions. Patient verbalized understanding and is agreeable with plan.  Final Clinical Impressions(s) / UC Diagnoses   Final diagnoses:  Vaginal discharge  Neck pain     Discharge Instructions     We have treated you today for gonorrhea and chlamydia, with rocephin and azithromycin. Please refrain from sexual intercourse for 7 days while medicines eliminating infection. Please begin metronidazole tomorrow to treat bacterial vaginosis. Do not drink alcohol while taking.   We are testing you for Gonorrhea, Chlamydia, Trichomonas, Yeast and Bacterial Vaginosis. We will call you if anything is positive and let you know if you require any further treatment. Please inform partners of any positive results.   Please return if symptoms not improving with treatment, development of fever, nausea, vomiting, abdominal pain.   Neck pain:  please take meloxicam daily for neck pain. May take flexeril at bedtime to help with neck pain and sleep.flexeril may cause sedation, do not drive after taking.   Please set up a primary provider to manage your anxiety and insomnia.       ED Prescriptions    Medication Sig Dispense Auth. Provider   metroNIDAZOLE (FLAGYL) 500 MG tablet Take 1 tablet (500 mg total) by mouth 2 (two) times daily for 7 days. 14 tablet Renata Gambino C, PA-C   cyclobenzaprine (FLEXERIL) 5 MG tablet Take 1 tablet (5 mg total) by mouth at bedtime for 7 days. 7 tablet Sheilla Maris C, PA-C   meloxicam (MOBIC) 7.5 MG tablet Take 1 tablet (7.5 mg total) by mouth daily. 20 tablet Taneika Choi,  [place] C, PA-C     Controlled Substance Prescriptions [place] Controlled Substance Registry consulted? Not Applicable   Lew Dawes, [place] 03/23/18 1545

## 2018-03-24 ENCOUNTER — Telehealth (HOSPITAL_COMMUNITY): Payer: Self-pay

## 2018-03-24 LAB — CERVICOVAGINAL ANCILLARY ONLY
Bacterial vaginitis: POSITIVE — AB
Candida vaginitis: NEGATIVE
Chlamydia: NEGATIVE
Neisseria Gonorrhea: NEGATIVE
Trichomonas: POSITIVE — AB

## 2018-03-24 NOTE — Telephone Encounter (Signed)
Trichomonas is positive. Rx metronidazole was given at the urgent care visit. Need to educate to please refrain from sexual intercourse for 7 days to give the medicine time to work. Sexual partners need to be notified and tested/treated. Condoms may reduce risk of reinfection.   Bacterial Vaginosis test is positive.  Prescription for metronidazole was given at the urgent care visit.   Attempted to reach patient. No answer, no voicemail available.

## 2018-05-26 DIAGNOSIS — F250 Schizoaffective disorder, bipolar type: Secondary | ICD-10-CM | POA: Diagnosis not present

## 2018-06-01 DIAGNOSIS — F250 Schizoaffective disorder, bipolar type: Secondary | ICD-10-CM | POA: Diagnosis not present

## 2018-06-13 DIAGNOSIS — H04123 Dry eye syndrome of bilateral lacrimal glands: Secondary | ICD-10-CM | POA: Diagnosis not present

## 2018-06-21 DIAGNOSIS — F250 Schizoaffective disorder, bipolar type: Secondary | ICD-10-CM | POA: Diagnosis not present

## 2018-07-20 DIAGNOSIS — Z23 Encounter for immunization: Secondary | ICD-10-CM | POA: Diagnosis not present

## 2018-07-27 DIAGNOSIS — Z0000 Encounter for general adult medical examination without abnormal findings: Secondary | ICD-10-CM | POA: Diagnosis not present

## 2018-07-27 DIAGNOSIS — Z131 Encounter for screening for diabetes mellitus: Secondary | ICD-10-CM | POA: Diagnosis not present

## 2018-07-27 DIAGNOSIS — R5383 Other fatigue: Secondary | ICD-10-CM | POA: Diagnosis not present

## 2018-07-27 DIAGNOSIS — Z87891 Personal history of nicotine dependence: Secondary | ICD-10-CM | POA: Diagnosis not present

## 2018-07-27 DIAGNOSIS — F17200 Nicotine dependence, unspecified, uncomplicated: Secondary | ICD-10-CM | POA: Diagnosis not present

## 2018-07-27 DIAGNOSIS — R0602 Shortness of breath: Secondary | ICD-10-CM | POA: Diagnosis not present

## 2018-07-27 DIAGNOSIS — E559 Vitamin D deficiency, unspecified: Secondary | ICD-10-CM | POA: Diagnosis not present

## 2018-07-27 DIAGNOSIS — I10 Essential (primary) hypertension: Secondary | ICD-10-CM | POA: Diagnosis not present

## 2018-08-07 DIAGNOSIS — Z780 Asymptomatic menopausal state: Secondary | ICD-10-CM | POA: Diagnosis not present

## 2018-08-09 DIAGNOSIS — R9431 Abnormal electrocardiogram [ECG] [EKG]: Secondary | ICD-10-CM | POA: Diagnosis not present

## 2018-08-09 DIAGNOSIS — R0602 Shortness of breath: Secondary | ICD-10-CM | POA: Diagnosis not present

## 2018-08-10 ENCOUNTER — Emergency Department (HOSPITAL_COMMUNITY): Payer: Medicare Other

## 2018-08-10 ENCOUNTER — Encounter (HOSPITAL_COMMUNITY): Payer: Self-pay | Admitting: Emergency Medicine

## 2018-08-10 ENCOUNTER — Other Ambulatory Visit: Payer: Self-pay

## 2018-08-10 ENCOUNTER — Inpatient Hospital Stay (HOSPITAL_COMMUNITY)
Admission: EM | Admit: 2018-08-10 | Discharge: 2018-08-14 | DRG: 287 | Disposition: A | Discharge status: Home / Self Care | Payer: Medicare Other | Attending: Internal Medicine | Admitting: Internal Medicine

## 2018-08-10 DIAGNOSIS — R0789 Other chest pain: Secondary | ICD-10-CM | POA: Diagnosis not present

## 2018-08-10 DIAGNOSIS — E876 Hypokalemia: Secondary | ICD-10-CM | POA: Diagnosis present

## 2018-08-10 DIAGNOSIS — Z882 Allergy status to sulfonamides status: Secondary | ICD-10-CM

## 2018-08-10 DIAGNOSIS — N189 Chronic kidney disease, unspecified: Secondary | ICD-10-CM | POA: Diagnosis present

## 2018-08-10 DIAGNOSIS — I25110 Atherosclerotic heart disease of native coronary artery with unstable angina pectoris: Secondary | ICD-10-CM | POA: Diagnosis not present

## 2018-08-10 DIAGNOSIS — M8580 Other specified disorders of bone density and structure, unspecified site: Secondary | ICD-10-CM | POA: Diagnosis not present

## 2018-08-10 DIAGNOSIS — Z90710 Acquired absence of both cervix and uterus: Secondary | ICD-10-CM

## 2018-08-10 DIAGNOSIS — R079 Chest pain, unspecified: Secondary | ICD-10-CM | POA: Diagnosis present

## 2018-08-10 DIAGNOSIS — Z9119 Patient's noncompliance with other medical treatment and regimen: Secondary | ICD-10-CM

## 2018-08-10 DIAGNOSIS — G4700 Insomnia, unspecified: Secondary | ICD-10-CM | POA: Diagnosis not present

## 2018-08-10 DIAGNOSIS — F17210 Nicotine dependence, cigarettes, uncomplicated: Secondary | ICD-10-CM | POA: Diagnosis present

## 2018-08-10 DIAGNOSIS — Z91199 Patient's noncompliance with other medical treatment and regimen due to unspecified reason: Secondary | ICD-10-CM

## 2018-08-10 DIAGNOSIS — E7800 Pure hypercholesterolemia, unspecified: Secondary | ICD-10-CM | POA: Diagnosis not present

## 2018-08-10 DIAGNOSIS — Z8249 Family history of ischemic heart disease and other diseases of the circulatory system: Secondary | ICD-10-CM

## 2018-08-10 DIAGNOSIS — E785 Hyperlipidemia, unspecified: Secondary | ICD-10-CM | POA: Diagnosis present

## 2018-08-10 DIAGNOSIS — Z880 Allergy status to penicillin: Secondary | ICD-10-CM

## 2018-08-10 DIAGNOSIS — F200 Paranoid schizophrenia: Secondary | ICD-10-CM | POA: Diagnosis not present

## 2018-08-10 DIAGNOSIS — R9439 Abnormal result of other cardiovascular function study: Secondary | ICD-10-CM

## 2018-08-10 DIAGNOSIS — I129 Hypertensive chronic kidney disease with stage 1 through stage 4 chronic kidney disease, or unspecified chronic kidney disease: Secondary | ICD-10-CM | POA: Diagnosis present

## 2018-08-10 DIAGNOSIS — R0602 Shortness of breath: Secondary | ICD-10-CM | POA: Diagnosis not present

## 2018-08-10 DIAGNOSIS — Z823 Family history of stroke: Secondary | ICD-10-CM

## 2018-08-10 DIAGNOSIS — Z720 Tobacco use: Secondary | ICD-10-CM | POA: Diagnosis present

## 2018-08-10 DIAGNOSIS — Z881 Allergy status to other antibiotic agents status: Secondary | ICD-10-CM

## 2018-08-10 DIAGNOSIS — I10 Essential (primary) hypertension: Secondary | ICD-10-CM | POA: Diagnosis not present

## 2018-08-10 DIAGNOSIS — G2401 Drug induced subacute dyskinesia: Secondary | ICD-10-CM | POA: Diagnosis present

## 2018-08-10 DIAGNOSIS — Z8673 Personal history of transient ischemic attack (TIA), and cerebral infarction without residual deficits: Secondary | ICD-10-CM

## 2018-08-10 DIAGNOSIS — J45909 Unspecified asthma, uncomplicated: Secondary | ICD-10-CM | POA: Diagnosis present

## 2018-08-10 DIAGNOSIS — E559 Vitamin D deficiency, unspecified: Secondary | ICD-10-CM | POA: Diagnosis not present

## 2018-08-10 DIAGNOSIS — Z888 Allergy status to other drugs, medicaments and biological substances status: Secondary | ICD-10-CM

## 2018-08-10 DIAGNOSIS — I493 Ventricular premature depolarization: Secondary | ICD-10-CM | POA: Diagnosis present

## 2018-08-10 DIAGNOSIS — F419 Anxiety disorder, unspecified: Secondary | ICD-10-CM | POA: Diagnosis present

## 2018-08-10 DIAGNOSIS — I200 Unstable angina: Secondary | ICD-10-CM

## 2018-08-10 HISTORY — DX: Chest pain, unspecified: R07.9

## 2018-08-10 HISTORY — DX: Tobacco use: Z72.0

## 2018-08-10 LAB — CBC
HCT: 44.9 % (ref 36.0–46.0)
Hemoglobin: 14.5 g/dL (ref 12.0–15.0)
MCH: 30.8 pg (ref 26.0–34.0)
MCHC: 32.3 g/dL (ref 30.0–36.0)
MCV: 95.3 fL (ref 80.0–100.0)
Platelets: 235 10*3/uL (ref 150–400)
RBC: 4.71 MIL/uL (ref 3.87–5.11)
RDW: 12.1 % (ref 11.5–15.5)
WBC: 5.4 10*3/uL (ref 4.0–10.5)
nRBC: 0 % (ref 0.0–0.2)

## 2018-08-10 LAB — MAGNESIUM: Magnesium: 1.8 mg/dL (ref 1.7–2.4)

## 2018-08-10 LAB — RAPID URINE DRUG SCREEN, HOSP PERFORMED
Amphetamines: NOT DETECTED
Barbiturates: NOT DETECTED
Benzodiazepines: NOT DETECTED
Cocaine: NOT DETECTED
Opiates: NOT DETECTED
Tetrahydrocannabinol: NOT DETECTED

## 2018-08-10 LAB — BASIC METABOLIC PANEL
Anion gap: 8 (ref 5–15)
BUN: 10 mg/dL (ref 8–23)
CO2: 26 mmol/L (ref 22–32)
Calcium: 9.9 mg/dL (ref 8.9–10.3)
Chloride: 108 mmol/L (ref 98–111)
Creatinine, Ser: 0.93 mg/dL (ref 0.44–1.00)
GFR calc Af Amer: >60 mL/min (ref >60)
GFR calc non Af Amer: >60 mL/min (ref >60)
Glucose, Bld: 93 mg/dL (ref 70–99)
Potassium: 2.8 mmol/L — ABNORMAL LOW (ref 3.5–5.1)
Sodium: 142 mmol/L (ref 135–145)

## 2018-08-10 LAB — HEMOGLOBIN A1C
Hgb A1c MFr Bld: 5.1 % (ref 4.8–5.6)
Mean Plasma Glucose: 99.67 mg/dL

## 2018-08-10 LAB — TROPONIN I
Troponin I: 0.04 ng/mL (ref <0.03)
Troponin I: 0.04 ng/mL (ref <0.03)

## 2018-08-10 LAB — I-STAT TROPONIN, ED: Troponin i, poc: 0.04 ng/mL (ref 0.00–0.08)

## 2018-08-10 MED ORDER — NITROGLYCERIN 0.4 MG SL SUBL
0.4000 mg | SUBLINGUAL_TABLET | SUBLINGUAL | Status: DC | PRN
  Administered 2018-08-10 – 2018-08-12 (×3): 0.4 mg via SUBLINGUAL
  Filled 2018-08-10 (×3): qty 1

## 2018-08-10 MED ORDER — MIRTAZAPINE 7.5 MG PO TABS
7.5000 mg | ORAL_TABLET | Freq: Every day | ORAL | Status: DC
  Administered 2018-08-10 – 2018-08-13 (×4): 7.5 mg via ORAL
  Filled 2018-08-10 (×4): qty 1

## 2018-08-10 MED ORDER — VALBENAZINE TOSYLATE 40 MG PO CAPS
40.0000 mg | ORAL_CAPSULE | ORAL | Status: DC
  Administered 2018-08-14: 40 mg via ORAL
  Filled 2018-08-10 (×2): qty 1

## 2018-08-10 MED ORDER — HYDROXYZINE HCL 10 MG PO TABS
10.0000 mg | ORAL_TABLET | Freq: Two times a day (BID) | ORAL | Status: DC | PRN
  Administered 2018-08-10 – 2018-08-12 (×2): 10 mg via ORAL
  Filled 2018-08-10 (×2): qty 1

## 2018-08-10 MED ORDER — AMLODIPINE BESYLATE 5 MG PO TABS
5.0000 mg | ORAL_TABLET | Freq: Every day | ORAL | Status: DC
  Administered 2018-08-10 – 2018-08-14 (×5): 5 mg via ORAL
  Filled 2018-08-10 (×5): qty 1

## 2018-08-10 MED ORDER — NICOTINE 14 MG/24HR TD PT24
14.0000 mg | MEDICATED_PATCH | Freq: Every day | TRANSDERMAL | Status: DC
  Administered 2018-08-10 – 2018-08-14 (×5): 14 mg via TRANSDERMAL
  Filled 2018-08-10 (×5): qty 1

## 2018-08-10 MED ORDER — POTASSIUM CHLORIDE CRYS ER 20 MEQ PO TBCR
40.0000 meq | EXTENDED_RELEASE_TABLET | Freq: Once | ORAL | Status: AC
  Administered 2018-08-10: 40 meq via ORAL
  Filled 2018-08-10: qty 2

## 2018-08-10 MED ORDER — BREXPIPRAZOLE 1 MG PO TABS
1.0000 mg | ORAL_TABLET | Freq: Every day | ORAL | Status: DC
  Administered 2018-08-11 – 2018-08-13 (×3): 1 mg via ORAL
  Filled 2018-08-10 (×5): qty 1

## 2018-08-10 MED ORDER — ASPIRIN 81 MG PO CHEW
324.0000 mg | CHEWABLE_TABLET | Freq: Once | ORAL | Status: AC
  Administered 2018-08-10: 324 mg via ORAL
  Filled 2018-08-10: qty 4

## 2018-08-10 MED ORDER — POTASSIUM CHLORIDE CRYS ER 20 MEQ PO TBCR
60.0000 meq | EXTENDED_RELEASE_TABLET | Freq: Once | ORAL | Status: AC
  Administered 2018-08-10: 60 meq via ORAL
  Filled 2018-08-10: qty 3

## 2018-08-10 MED ORDER — ENOXAPARIN SODIUM 40 MG/0.4ML ~~LOC~~ SOLN
40.0000 mg | SUBCUTANEOUS | Status: DC
  Administered 2018-08-10 – 2018-08-13 (×3): 40 mg via SUBCUTANEOUS
  Filled 2018-08-10 (×4): qty 0.4

## 2018-08-10 NOTE — ED Notes (Signed)
Pt reports pain relief with 0.4mg  nitroglycerin. Prior to nitro administration cp 7/10. Post nitro administration cp 2/10. Cardiology PA aware.

## 2018-08-10 NOTE — ED Notes (Addendum)
Cardiology PA bedside. 

## 2018-08-10 NOTE — H&P (Signed)
History and Physical    Danielle [name] ZOX:[id number] DOB: [birth year] DOA: 08/10/2018  PCP: [facility]  Patient coming from: home  I have personally briefly reviewed patient's old medical records in [facility]  Chief Complaint: chest pain  HPI: Danielle [name] is a 67 y.o. female with medical history significant of bipolar, hypertension, tobacco abuse, asthma, and multiple other medical problems who is presenting with chest pain.   She notes her symptoms started about 2 months ago.  She describes the chest pain as being left-sided and dull in quality.  She notes that it comes and goes.  She describes as better at rest and worse when she is moving.  She notes he occasionally gets pain with ambulation.  Her chest pain has been coming more often recently.  She tells me that her mother had a heart attack and stroke at 34.  She tells me that her father had heart attack and stroke as well in his 56s.  She smokes about 1 pack a day.  She is interested in quitting.  She drinks about 4 days a week.  She denies shortness of breath, abdominal pain, fevers, chills, nausea, vomiting, numbness or tingling or weakness.  ED Course: Labs, EKG, chest x-ray, cardiology consult.  Hospitalist to admit for chest pain rule out.  Review of Systems: As per HPI otherwise 10 point review of systems negative.   Past Medical History:  Diagnosis Date  . Asthma   . Bipolar affective disorder, currently depressed, mild (HCC)   . Complication of anesthesia    " My eyes swell up "  . Coronary artery disease   . Depression   . Fall 08/25/2012   Recent fall with residual knee and back pain.  . Family history of anesthesia complication    " [name] "  . Hypertension   . Memory disturbance 01/30/2016  . Stroke (HCC)   . Tardive dyskinesia     Past Surgical History:  Procedure Laterality Date  . ABDOMINAL HYSTERECTOMY    . CARDIAC CATHETERIZATION       reports that she has been smoking  cigarettes. She has been smoking about 1.00 pack per day. She has never used smokeless tobacco. She reports that she drinks alcohol. She reports that she does not use drugs.  Allergies  Allergen Reactions  . Clonazepam Swelling    Pt was recently given a triamterene hydrochlorothiazide combination as well as clonazepam and had an allergic reaction to one of them. Caused swelling of face and eyes  . Doxycycline Swelling    Swelling of face and eyes  . Fosinopril Sodium Swelling     swelling face,lips-angioedema from ACE I  . Hydrochlorothiazide W-Triamterene Swelling    Pt was recently given a triamterene hydrochlorothiazide combination as well as clonazepam and had an allergic reaction to one of them. Swelling of face and eyes  . Penicillins Swelling    Face and eyes Has patient had a PCN reaction causing immediate rash, facial/tongue/throat swelling, SOB or lightheadedness with hypotension: Yes Has patient had a PCN reaction causing severe rash involving mucus membranes or skin necrosis: No Has patient had a PCN reaction that required hospitalization: No Has patient had a PCN reaction occurring within the last 10 years: No If all of the above answers are "NO", then may proceed with Cephalosporin use.   . Sulfa Antibiotics Other (See Comments)    Unknown reaction    Family History  Problem Relation Age of Onset  .  Hypertension Mother   . Stroke Mother   . Heart attack Mother   . Heart attack Father   . Heart attack Brother   . Heart attack Sister   . Dementia Neg Hx    Prior to Admission medications   Medication Sig Start Date End Date Taking? Authorizing Provider  Brexpiprazole (REXULTI) 1 MG TABS Take 1 mg by mouth at bedtime.   Yes [provider]  hydrOXYzine (ATARAX/VISTARIL) 10 MG tablet Take 10 mg by mouth 2 (two) times daily as needed for anxiety.    Yes [provider]  mirtazapine (REMERON) 15 MG tablet Take 7.5 mg by mouth at bedtime.    Yes  [provider]  Valbenazine Tosylate (INGREZZA) 40 MG CAPS Take 40 mg by mouth every morning.   Yes [provider]    Physical Exam: Vitals:   08/10/18 1615 08/10/18 1630 08/10/18 1645 08/10/18 1735  BP: (!) 154/109 (!) 168/100 (!) 157/94 (!) 144/96  Pulse: 80 86 85 81  Resp: 19 (!) 21 (!) 21 20  Temp:    98.1 F (36.7 C)  TempSrc:    Oral  SpO2: 100% 100% 99% 97%  Weight:      Height:        Constitutional: NAD, calm, comfortable Vitals:   08/10/18 1615 08/10/18 1630 08/10/18 1645 08/10/18 1735  BP: (!) 154/109 (!) 168/100 (!) 157/94 (!) 144/96  Pulse: 80 86 85 81  Resp: 19 (!) 21 (!) 21 20  Temp:    98.1 F (36.7 C)  TempSrc:    Oral  SpO2: 100% 100% 99% 97%  Weight:      Height:       Eyes: PERRL, lids and conjunctivae normal ENMT: Mucous membranes are moist. Posterior pharynx clear of any exudate or lesions.Normal dentition.  Neck: normal, supple, no masses, no thyromegaly Respiratory: clear to auscultation bilaterally, no wheezing, no crackles. Normal respiratory effort. No accessory muscle use.  Cardiovascular: Regular rate and rhythm, no murmurs / rubs / gallops. No extremity edema. 2+ pedal pulses. No carotid bruits.  Abdomen: no tenderness, no masses palpated. No hepatosplenomegaly. Bowel sounds positive.  Musculoskeletal: no clubbing / cyanosis. No joint deformity upper and lower extremities. Good ROM, no contractures. Normal muscle tone.  Skin: no rashes, lesions, ulcers. No induration Neurologic: CN 2-12 grossly intact. Sensation intact. Strength 5/5 in all 4.  Psychiatric: Normal judgment and insight. Alert and oriented x 3. Normal mood.   Labs on Admission: I have personally reviewed following labs and imaging studies  CBC: Recent Labs  Lab 08/10/18 0959  WBC 5.4  HGB 14.5  HCT 44.9  MCV 95.3  PLT 235   Basic Metabolic Panel: Recent Labs  Lab 08/10/18 0959  NA 142  K 2.8*  CL 108  CO2 26  GLUCOSE 93  BUN 10  CREATININE  0.93  CALCIUM 9.9   GFR: Estimated Creatinine Clearance: 49.2 mL/min (by C-G formula based on SCr of 0.93 mg/dL). Liver Function Tests: No results for input(s): AST, ALT, ALKPHOS, BILITOT, PROT, ALBUMIN in the last 168 hours. No results for input(s): LIPASE, AMYLASE in the last 168 hours. No results for input(s): AMMONIA in the last 168 hours. Coagulation Profile: No results for input(s): INR, PROTIME in the last 168 hours. Cardiac Enzymes: Recent Labs  Lab 08/10/18 1621  TROPONINI 0.04*   BNP (last 3 results) No results for input(s): PROBNP in the last 8760 hours. HbA1C: Recent Labs    08/10/18 1621  HGBA1C  5.1   CBG: No results for input(s): GLUCAP in the last 168 hours. Lipid Profile: No results for input(s): CHOL, HDL, LDLCALC, TRIG, CHOLHDL, LDLDIRECT in the last 72 hours. Thyroid Function Tests: No results for input(s): TSH, T4TOTAL, FREET4, T3FREE, THYROIDAB in the last 72 hours. Anemia Panel: No results for input(s): VITAMINB12, FOLATE, FERRITIN, TIBC, IRON, RETICCTPCT in the last 72 hours. Urine analysis:    Component Value Date/Time   COLORURINE YELLOW 05/23/2014 1540   APPEARANCEUR CLEAR 05/23/2014 1540   LABSPEC 1.010 03/23/2018 1531   PHURINE 6.5 03/23/2018 1531   GLUCOSEU NEGATIVE 03/23/2018 1531   HGBUR NEGATIVE 03/23/2018 1531   HGBUR negative 10/25/2008 0936   BILIRUBINUR NEGATIVE 03/23/2018 1531   KETONESUR NEGATIVE 03/23/2018 1531   PROTEINUR NEGATIVE 03/23/2018 1531   UROBILINOGEN 0.2 03/23/2018 1531   NITRITE NEGATIVE 03/23/2018 1531   LEUKOCYTESUR TRACE (A) 03/23/2018 1531    Radiological Exams on Admission: Dg Chest 2 View  Result Date: 08/10/2018 CLINICAL DATA:  Dull, intermittent chest pain and shortness of breath for 2 months. EXAM: CHEST - 2 VIEW COMPARISON:  PA and lateral chest 04/04/2017 and 05/23/2014. FINDINGS: The lungs are clear. There is cardiomegaly. Aortic atherosclerosis is noted. No pneumothorax or pleural fluid. No acute  or focal bony abnormality. IMPRESSION: No acute disease. Cardiomegaly. Atherosclerosis. Electronically Signed   By: Drusilla Kanner M.D.   On: 08/10/2018 10:51    EKG: Independently reviewed. NSR, appears consistent with priors.  Assessment/Plan Active Problems:   Chest pain  Chest Pain: CP over 2 months.  L sided.  Worse with exertion and better with rest.  Improves with nitro.  Family hx of CAD in mother and father.  EKG without ST-T wave abnormalities suggestive of ischemia. Trend troponin A1c, lipids Follow utox Cardiology c/s, appreciate recs.  Plan for stress test.  Hypertension: amlodipine started by cardiology, follow  HLD: follow lipid panel  Hypokalemia: replete, follow magnesium   Bipolar Disorder: stable, continue home meds  Hx Tobacco Abuse: pt interested in quitting.  Nicotine patch.  Interested in chantix, can discuss prescription at discharge.   DVT prophylaxis: lovenox  Code Status: full  Family Communication: partner at bedside  Disposition Plan: pending cards s/o and stress test Consults called: cardiology  Admission status: observation    Lacretia Nicks MD [facility] Pager [phone]  If 7PM-7AM, please contact night-coverage [email or web address] Password [facility]  08/10/2018, 9:13 PM

## 2018-08-10 NOTE — ED Triage Notes (Signed)
Onset 2 months ago developed chest pain and shortness of breath. Seen doctor yesterday and again today. Sent to the ED for evaluation. Currently 7/10 chest pain pressure with shortness of breath. Alert answering and following commands appropriate.

## 2018-08-10 NOTE — Consult Note (Addendum)
Cardiology Consult:   Patient ID: Danielle [name] MRN: [id number]; DOB: 1951-02-11   Admission date: 08/10/2018  Primary Care Provider: [facility] Primary Cardiologist: New to [facility]; Dr. Eden Emms Primary Electrophysiologist:  None   Chief Complaint:  Chest pain  Patient Profile:   Danielle [name] is a 67 y.o. female with PMH of HTN, HLD, bipolar disorder/schizophrenia, tardive dyskinesia, asthma, and tobacco abuse, who presented with chest pain x2 months, for which cardiology was asked to evaluate by Dr. Dalene Seltzer.  History of Present Illness:   Ms. Nickolson has been experiencing intermittent chest pain for the past 2 months. She is unable to say how frequently episodes occur but states they have occurred at both rest and with exertion. Her last episode was this morning, prompting her to present to her PCP office [facility]) and was instructed to present to the ED for further evaluation. She stated her pain initially resolved prior to PCP evaluation and reoccurred en route to the ED. At the time of my evaluation she reported sharp left sided chest pain rated 7/10. She was given 1 SL nitro with resolution of chest pain. She reported associated SOB but no N/V, dizziness, lightheadedness, diaphoresis, palpations, or syncope  She has no prior cardiac history. No prior ischemic evaluation. She has a history of HTN but has not taken her medications for the past month and dose not recall prior prescriptions. She also reports history of HLD but is not on medications for this either. She smokes 1ppd and states she was recently prescribed chantix but has not picked it up from the pharmacy yet. She drinks 2 small bottles of wine per week. She denies recreational drug use, however on admission to [facility] in 2018 there is mention of cocaine abuse/dependence. She reports compliance with her psychiatric medications.   ED course: Hypertensive, otherwise VSS. Labs notable for K 2.8, Cr  0.93, Hgb 14.5, PLT 235, Troponin 0.04. EKG with sinus rhythm with non-specific T wave abnormalities, LAE, PVC, no STE/D. CXR without acute finding; cardiomegaly and atherosclerosis noted. She was given ASA 324mg  and 1 SL nitro in the ED with resolution of chest pain. Cardiology asked to evaluate the patient for chest pain.     Past Medical History:  Diagnosis Date  . Asthma   . Bipolar affective disorder, currently depressed, mild (HCC)   . Complication of anesthesia    " My eyes swell up "  . Coronary artery disease   . Depression   . Fall 08/25/2012   Recent fall with residual knee and back pain.  . Family history of anesthesia complication    " [name] "  . Hypertension   . Memory disturbance 01/30/2016  . Stroke (HCC)   . Tardive dyskinesia     Past Surgical History:  Procedure Laterality Date  . ABDOMINAL HYSTERECTOMY    . CARDIAC CATHETERIZATION       Medications Prior to Admission: Prior to Admission medications   Medication Sig Start Date End Date Taking? Authorizing Provider  benztropine (COGENTIN) 1 MG tablet Take 1 mg by mouth 2 (two) times daily.   Yes [provider]  risperiDONE (RISPERDAL) 1 MG tablet Take 1 mg by mouth 2 (two) times daily. 03/18/17  Yes [provider]  meloxicam (MOBIC) 7.5 MG tablet Take 1 tablet (7.5 mg total) by mouth daily. Patient not taking: Reported on 08/10/2018 03/23/18   Lew Dawes, PA-C     Allergies:    Allergies  Allergen Reactions  .  Clonazepam Swelling    Pt was recently given a triamterene hydrochlorothiazide combination as well as clonazepam and had an allergic reaction to one of them. Caused swelling of face and eyes  . Doxycycline Swelling    Swelling of face and eyes  . Fosinopril Sodium Swelling     swelling face,lips-angioedema from ACE I  . Hydrochlorothiazide W-Triamterene Swelling    Pt was recently given a triamterene hydrochlorothiazide combination as well as clonazepam and had an  allergic reaction to one of them. Swelling of face and eyes  . Penicillins Swelling    Face and eyes Has patient had a PCN reaction causing immediate rash, facial/tongue/throat swelling, SOB or lightheadedness with hypotension: Yes Has patient had a PCN reaction causing severe rash involving mucus membranes or skin necrosis: No Has patient had a PCN reaction that required hospitalization: No Has patient had a PCN reaction occurring within the last 10 years: No If all of the above answers are "NO", then may proceed with Cephalosporin use.   . Sulfa Antibiotics Other (See Comments)    Unknown reaction    Social History:   Social History   Socioeconomic History  . Marital status: Divorced    Spouse name: Not on file  . Number of children: 2  . Years of education: 45  . Highest education level: Not on file  Occupational History  . Occupation: Retired [employer] operational officer  . Financial resource strain: Not on file  . Food insecurity:    Worry: Not on file    Inability: Not on file  . Transportation needs:    Medical: Not on file    Non-medical: Not on file  Tobacco Use  . Smoking status: Former Smoker    Types: Cigarettes    Last attempt to quit: 07/13/2012    Years since quitting: 6.0  . Smokeless tobacco: Never Used  Substance and Sexual Activity  . Alcohol use: Yes    Alcohol/week: 0.0 standard drinks    Comment: About 1 qt per week  . Drug use: No    Types: Cocaine, Marijuana    Comment: Former user  . Sexual activity: Not Currently  Lifestyle  . Physical activity:    Days per week: Not on file    Minutes per session: Not on file  . Stress: Not on file  Relationships  . Social connections:    Talks on phone: Not on file    Gets together: Not on file    Attends religious service: Not on file    Active member of club or organization: Not on file    Attends meetings of clubs or organizations: Not on file    Relationship status: Not on file  . Intimate  partner violence:    Fear of current or ex partner: Not on file    Emotionally abused: Not on file    Physically abused: Not on file    Forced sexual activity: Not on file  Other Topics Concern  . Not on file  Social History Narrative   Lives at home alone   Can use both hands   Denies caffeine use    Family History:  Mother died from MI at age 53; father died from MI in his 25s.  The patient's family history includes Heart attack in her brother, father, mother, and sister; Hypertension in her mother; Stroke in her mother. There is no history of Dementia.    ROS:  Please see the history of present illness.  All other ROS reviewed and negative.     Physical Exam/Data:   Vitals:   08/10/18 1130 08/10/18 1145 08/10/18 1200 08/10/18 1230  BP: (!) 134/92 (!) 153/91 (!) 138/91 (!) 157/99  Pulse: 88 78 78 77  Resp: (!) 21 20 18  (!) 23  Temp:      TempSrc:      SpO2: 98% 98% 100% 100%  Weight:      Height:       No intake or output data in the 24 hours ending 08/10/18 1248 Filed Weights   08/10/18 0953  Weight: 57.6 kg   Body mass index is 24 kg/m.  General:  Well nourished, well developed, sitting upright in bed in no acute distress HEENT: sclera anicteric Neck: no JVD Vascular: No carotid bruits; distal pulses 2+ bilaterally Cardiac:  normal S1, S2; RRR; no murmurs, rubs, or gallops Lungs:  clear to auscultation bilaterally, no wheezing, rhonchi or rales  Abd: soft, nontender, no hepatomegaly  Ext: no edema Musculoskeletal:  No deformities, BUE and BLE strength normal and equal Skin: warm and dry  Neuro:  CNs 2-12 intact, no focal abnormalities noted Psych:  Flat affect    EKG:  EKG with sinus rhythm with non-specific T wave abnormalities, LAE, PVC, no STE/D.    Relevant CV Studies: None  Laboratory Data:  Chemistry Recent Labs  Lab 08/10/18 0959  NA 142  K 2.8*  CL 108  CO2 26  GLUCOSE 93  BUN 10  CREATININE 0.93  CALCIUM 9.9  GFRNONAA >60  GFRAA  >60  ANIONGAP 8    No results for input(s): PROT, ALBUMIN, AST, ALT, ALKPHOS, BILITOT in the last 168 hours. Hematology Recent Labs  Lab 08/10/18 0959  WBC 5.4  RBC 4.71  HGB 14.5  HCT 44.9  MCV 95.3  MCH 30.8  MCHC 32.3  RDW 12.1  PLT 235   Cardiac EnzymesNo results for input(s): TROPONINI in the last 168 hours.  Recent Labs  Lab 08/10/18 1006  TROPIPOC 0.04    BNPNo results for input(s): BNP, PROBNP in the last 168 hours.  DDimer No results for input(s): DDIMER in the last 168 hours.  Radiology/Studies:  Dg Chest 2 View  Result Date: 08/10/2018 CLINICAL DATA:  Dull, intermittent chest pain and shortness of breath for 2 months. EXAM: CHEST - 2 VIEW COMPARISON:  PA and lateral chest 04/04/2017 and 05/23/2014. FINDINGS: The lungs are clear. There is cardiomegaly. Aortic atherosclerosis is noted. No pneumothorax or pleural fluid. No acute or focal bony abnormality. IMPRESSION: No acute disease. Cardiomegaly. Atherosclerosis. Electronically Signed   By: Drusilla Kanner M.D.   On: 08/10/2018 10:51    Assessment and Plan:   1. Chest pain: patient presented with intermittent worsening chest pain for the past 2 months, last episodes on arrival to the ED which resolved with SL nitro. Trop 0.04. EKG with sinus rhythm with non-specific T wave abnormalities, LAE, PVC, no STE/D. CXR without acute finding; cardiomegaly and atherosclerosis noted. She has a history of cocaine abuse but denies use at this time. Suspect this could be contributed to poorly controlled HTN given atypical symptoms of chest pain at rest and with exertion. Risk factors for ACS include HTN (unmanaged), HLD (unmanaged), tobacco abuse, and family history of CAD including mother who died from an MI at age 77.  - Continue to trend troponin x3  - Will check a Lipid panel and HgbA1C for risk stratification - Check a Utox.  - Will plan for a  NST in AM for further ischemic evaluation  2. HTN: BP elevated to 150s/90s.  -  Will start amlodipine 5mg  daily for now and monitor for response - Additional BP med recommendations pending above work-up  3. HLD: patient reports prior elevated cholesterol but is not on medications - Check lipid panel   4. Hypokalemia: K 2.8 on admission. Repleted with 40 mEq - Will give an additional 60 mEq today  5. Bipolar disorder: reports compliance with her medication - Continue home regimen  6. Tobacco abuse: smokes 1ppd. Recently prescribed chantix but has not started yet. Counseled on health risks. - Continue to encourage smoking cessation  - Will order nicotine patch   For questions or updates, please contact [facility] Please consult [email or web address] for contact info under        Signed, Beatriz Stallion, PA-C  08/10/2018 12:48 PM   Patient examined chart reviewed. Atypical pain. Troponin negative ECG with nonspecific ST changes and PVC;s Multiple CRF;s Exam with black female tardive dyskinesia. Clear lungs no murmur abdomen soft good distal pulses CXR NAD. Troponin only .04 Favor inpatient lexiscan myovue in am with Rx of HTN and repletion of K Discussed using Chantix to help stop smoking   Charlton Haws

## 2018-08-10 NOTE — ED Provider Notes (Signed)
[facility] EMERGENCY DEPARTMENT Provider Note   CSN: [id number] Arrival date & time: 08/10/18  4098     History   Chief Complaint Chief Complaint  Patient presents with  . Chest Pain  . Shortness of Breath    HPI Danielle [name] is a 67 y.o. female.  HPI   Chest pain, worsening over the last months. Initially was present just when exerting self and would improve with rest, however now it is occurring even with rest.  Worsens with exertion, if she rests it improves, except now will get it when laying down in the bed or sitting at home  Dull, located on left side, feels it radiating into right arm No nausea, no vomiting Some dyspnea Hx of blood clot,  Was a long time ago, no longer on blood thinners  Some dizziness, lightheadedness  Pain now 7-8/10  Htn, hlpd Mom, had CAD, died at 69 Smoking, no other drugs   Past Medical History:  Diagnosis Date  . Asthma   . Bipolar affective disorder, currently depressed, mild (HCC)   . Complication of anesthesia    " My eyes swell up "  . Coronary artery disease   . Depression   . Fall 08/25/2012   Recent fall with residual knee and back pain.  . Family history of anesthesia complication    " [name] "  . Hypertension   . Memory disturbance 01/30/2016  . Stroke (HCC)   . Tardive dyskinesia     Patient Active Problem List   Diagnosis Date Noted  . Chest pain 08/10/2018  . Memory disturbance 01/30/2016  . Acute encephalopathy 05/23/2014  . Altered mental status 12/08/2013  . Schizophrenia, paranoid type (HCC) 04/28/2012  . Cocaine abuse (HCC) 04/28/2012  . Cannabis abuse 04/28/2012  . Substance induced mood disorder (HCC) 04/28/2012  . Noncompliance 09/16/2011  . Anxiety 09/16/2011  . Constipation 09/16/2011  . Chest pain 09/14/2011  . SUBSTANCE ABUSE, MULTIPLE 12/03/2008  . HYPERLIPIDEMIA 08/01/2008  . MIGRAINE HEADACHE 08/01/2008  . ALLERGIC RHINITIS WITH CONJUNCTIVITIS 08/01/2008  .  ASTHMA 08/01/2008  . DEPRESSION 06/20/2008  . URI 06/20/2008  . ANXIETY DISORDER, GENERALIZED 05/28/2008  . HYPERTENSION, BENIGN ESSENTIAL 05/28/2008  . GERD 05/28/2008  . BACTERIAL VAGINITIS 05/24/2008  . DEEP VENOUS THROMBOPHLEBITIS, LEFT, LEG, HX OF 10/18/1988    Past Surgical History:  Procedure Laterality Date  . ABDOMINAL HYSTERECTOMY    . CARDIAC CATHETERIZATION       OB History   None      Home Medications    Prior to Admission medications   Medication Sig Start Date End Date Taking? Authorizing Provider  Brexpiprazole (REXULTI) 1 MG TABS Take 1 mg by mouth at bedtime.   Yes [provider]  hydrOXYzine (ATARAX/VISTARIL) 10 MG tablet Take 10 mg by mouth 2 (two) times daily as needed.   Yes [provider]  mirtazapine (REMERON) 7.5 MG tablet Take 7.5 mg by mouth at bedtime.   Yes [provider]  Valbenazine Tosylate (INGREZZA) 40 MG CAPS Take 40 mg by mouth every morning.   Yes [provider]    Family History Family History  Problem Relation Age of Onset  . Hypertension Mother   . Stroke Mother   . Heart attack Mother   . Heart attack Father   . Heart attack Brother   . Heart attack Sister   . Dementia Neg Hx     Social History Social History   Tobacco Use  .  Smoking status: Current Every Day Smoker    Packs/day: 1.00    Types: Cigarettes  . Smokeless tobacco: Never Used  Substance Use Topics  . Alcohol use: Yes    Alcohol/week: 0.0 standard drinks    Comment: About 1 qt per week  . Drug use: No    Types: Cocaine, Marijuana    Comment: Former user     Allergies   Clonazepam; Doxycycline; Fosinopril sodium; Hydrochlorothiazide w-triamterene; Penicillins; and Sulfa antibiotics   Review of Systems Review of Systems  Constitutional: Negative for fever.  HENT: Negative for sore throat.   Eyes: Negative for visual disturbance.  Respiratory: Positive for shortness of breath. Negative for cough.     Cardiovascular: Positive for chest pain.  Gastrointestinal: Negative for abdominal pain.  Genitourinary: Negative for difficulty urinating.  Musculoskeletal: Negative for back pain and neck pain.  Skin: Negative for rash.  Neurological: Positive for light-headedness. Negative for syncope and headaches.     Physical Exam Updated Vital Signs BP (!) 144/96 (BP Location: Right Arm)   Pulse 81   Temp 98.1 F (36.7 C) (Oral)   Resp 20   Ht 5\' 1"  (1.549 m)   Wt 57.6 kg   SpO2 97%   BMI 24.00 kg/m   Physical Exam  Constitutional: She is oriented to person, place, and time. She appears well-developed and well-nourished. No distress.  HENT:  Head: Normocephalic and atraumatic.  Eyes: Conjunctivae and EOM are normal.  Neck: Normal range of motion.  Cardiovascular: Normal rate, regular rhythm, normal heart sounds and intact distal pulses. Exam reveals no gallop and no friction rub.  No murmur heard. Pulmonary/Chest: Effort normal and breath sounds normal. No respiratory distress. She has no wheezes. She has no rales.  Abdominal: Soft. She exhibits no distension. There is no tenderness. There is no guarding.  Musculoskeletal: She exhibits no edema or tenderness.  Neurological: She is alert and oriented to person, place, and time.  Skin: Skin is warm and dry. No rash noted. She is not diaphoretic. No erythema.  Nursing note and vitals reviewed.    ED Treatments / Results  Labs (all labs ordered are listed, but only abnormal results are displayed) Labs Reviewed  BASIC METABOLIC PANEL - Abnormal; Notable for the following components:      Result Value   Potassium 2.8 (*)    All other components within normal limits  TROPONIN I - Abnormal; Notable for the following components:   Troponin I 0.04 (*)    All other components within normal limits  CBC  HEMOGLOBIN A1C  RAPID URINE DRUG SCREEN, HOSP PERFORMED  TROPONIN I  CBC  COMPREHENSIVE METABOLIC PANEL  LIPID PANEL  HIV  ANTIBODY (ROUTINE TESTING W REFLEX)  I-STAT TROPONIN, ED    EKG EKG Interpretation  Date/Time:  Thursday August 10 2018 09:49:40 EDT Ventricular Rate:  91 PR Interval:  150 QRS Duration: 80 QT Interval:  372 QTC Calculation: 457 R Axis:   74 Text Interpretation:  Sinus rhythm with occasional Premature ventricular complexes Possible Left atrial enlargement Nonspecific T wave abnormality Abnormal ECG No significant change since last tracing Confirmed by Alvira Monday ([id number]) on 08/10/2018 11:39:14 AM   Radiology Dg Chest 2 View  Result Date: 08/10/2018 CLINICAL DATA:  Dull, intermittent chest pain and shortness of breath for 2 months. EXAM: CHEST - 2 VIEW COMPARISON:  PA and lateral chest 04/04/2017 and 05/23/2014. FINDINGS: The lungs are clear. There is cardiomegaly. Aortic atherosclerosis is noted. No pneumothorax or pleural fluid.  No acute or focal bony abnormality. IMPRESSION: No acute disease. Cardiomegaly. Atherosclerosis. Electronically Signed   By: Drusilla Kanner M.D.   On: 08/10/2018 10:51    Procedures Procedures (including critical care time)  Medications Ordered in ED Medications  nitroGLYCERIN (NITROSTAT) SL tablet 0.4 mg (0.4 mg Sublingual Given 08/10/18 1328)  amLODipine (NORVASC) tablet 5 mg (5 mg Oral Given 08/10/18 1624)  nicotine (NICODERM CQ - dosed in mg/24 hours) patch 14 mg (14 mg Transdermal Patch Applied 08/10/18 1623)  Brexpiprazole TABS 1 mg (has no administration in time range)  hydrOXYzine (ATARAX/VISTARIL) tablet 10 mg (has no administration in time range)  mirtazapine (REMERON) tablet 7.5 mg (has no administration in time range)  Valbenazine Tosylate CAPS 40 mg (has no administration in time range)  enoxaparin (LOVENOX) injection 40 mg (has no administration in time range)  potassium chloride SA (K-DUR,KLOR-CON) CR tablet 40 mEq (40 mEq Oral Given 08/10/18 1249)  aspirin chewable tablet 324 mg (324 mg Oral Given 08/10/18 1250)  potassium  chloride SA (K-DUR,KLOR-CON) CR tablet 60 mEq (60 mEq Oral Given 08/10/18 1624)     Initial Impression / Assessment and Plan / ED Course  I have reviewed the triage vital signs and the nursing notes.  Pertinent labs & imaging results that were available during my care of the patient were reviewed by me and considered in my medical decision making (see chart for details).    67 year old female with history of hypertension, hyperlipidemia, smoking, coronary artery disease, bipolar, CVA, presents with concern for chest pain.  EKG was evaluate me and shows no significant abnormalities.  Chest x-ray shows no evidence of pneumonia, pneumothorax, or pulmonary edema.  Patient does have a remote history of DVT, but denies any ongoing shortness of breath, and history described by patient has more concerning for cardiac etiology than pulmonary embolus.  She has no tachycardia, no hypoxia, no asymmetric leg swelling and denies dyspnea at this time.  Do not feel history and exam are consistent with aortic dissection.  History the patient has provided me is concerning for escalating anginal symptoms, possible unstable angina.  Initial troponin is negative.  Reports exertional symptoms of dull chest pain radiating to the right arm initially that have now become more constant.  Consulted cardiology given history and they recommend hospitalist admission, stress test. Pt to be admitted for further care.   Final Clinical Impressions(s) / ED Diagnoses   Final diagnoses:  Chest pain with high risk for cardiac etiology    ED Discharge Orders    None       Alvira Monday, MD 08/10/18 1800

## 2018-08-11 ENCOUNTER — Observation Stay (HOSPITAL_BASED_OUTPATIENT_CLINIC_OR_DEPARTMENT_OTHER): Payer: Medicare Other

## 2018-08-11 ENCOUNTER — Ambulatory Visit (HOSPITAL_BASED_OUTPATIENT_CLINIC_OR_DEPARTMENT_OTHER): Payer: Medicare Other

## 2018-08-11 ENCOUNTER — Encounter (HOSPITAL_COMMUNITY): Payer: Self-pay | Admitting: Internal Medicine

## 2018-08-11 DIAGNOSIS — E876 Hypokalemia: Secondary | ICD-10-CM

## 2018-08-11 DIAGNOSIS — Z720 Tobacco use: Secondary | ICD-10-CM | POA: Diagnosis present

## 2018-08-11 DIAGNOSIS — I10 Essential (primary) hypertension: Secondary | ICD-10-CM | POA: Diagnosis not present

## 2018-08-11 DIAGNOSIS — I700 Atherosclerosis of aorta: Secondary | ICD-10-CM | POA: Diagnosis not present

## 2018-08-11 DIAGNOSIS — I340 Nonrheumatic mitral (valve) insufficiency: Secondary | ICD-10-CM

## 2018-08-11 DIAGNOSIS — I2510 Atherosclerotic heart disease of native coronary artery without angina pectoris: Secondary | ICD-10-CM | POA: Diagnosis not present

## 2018-08-11 DIAGNOSIS — R079 Chest pain, unspecified: Secondary | ICD-10-CM | POA: Diagnosis not present

## 2018-08-11 LAB — CBC
HCT: 39.2 % (ref 36.0–46.0)
Hemoglobin: 13.2 g/dL (ref 12.0–15.0)
MCH: 31.7 pg (ref 26.0–34.0)
MCHC: 33.7 g/dL (ref 30.0–36.0)
MCV: 94 fL (ref 80.0–100.0)
Platelets: 215 10*3/uL (ref 150–400)
RBC: 4.17 MIL/uL (ref 3.87–5.11)
RDW: 12.2 % (ref 11.5–15.5)
WBC: 6 10*3/uL (ref 4.0–10.5)
nRBC: 0 % (ref 0.0–0.2)

## 2018-08-11 LAB — LIPID PANEL
Cholesterol: 233 mg/dL — ABNORMAL HIGH (ref 0–200)
HDL: 78 mg/dL (ref >40)
LDL Cholesterol: 129 mg/dL — ABNORMAL HIGH (ref 0–99)
Total CHOL/HDL Ratio: 3 RATIO
Triglycerides: 132 mg/dL (ref <150)
VLDL: 26 mg/dL (ref 0–40)

## 2018-08-11 LAB — NM MYOCAR MULTI W/SPECT W/WALL MOTION / EF
MPHR: 155 {beats}/min
Peak HR: 110 {beats}/min
Percent HR: 70 %
Rest HR: 86 {beats}/min

## 2018-08-11 LAB — COMPREHENSIVE METABOLIC PANEL
ALT: 11 U/L (ref 0–44)
AST: 19 U/L (ref 15–41)
Albumin: 3.3 g/dL — ABNORMAL LOW (ref 3.5–5.0)
Alkaline Phosphatase: 71 U/L (ref 38–126)
Anion gap: 9 (ref 5–15)
BUN: 12 mg/dL (ref 8–23)
CO2: 24 mmol/L (ref 22–32)
Calcium: 9.5 mg/dL (ref 8.9–10.3)
Chloride: 107 mmol/L (ref 98–111)
Creatinine, Ser: 1.13 mg/dL — ABNORMAL HIGH (ref 0.44–1.00)
GFR calc Af Amer: 58 mL/min — ABNORMAL LOW (ref >60)
GFR calc non Af Amer: 50 mL/min — ABNORMAL LOW (ref >60)
Glucose, Bld: 102 mg/dL — ABNORMAL HIGH (ref 70–99)
Potassium: 3.6 mmol/L (ref 3.5–5.1)
Sodium: 140 mmol/L (ref 135–145)
Total Bilirubin: 0.5 mg/dL (ref 0.3–1.2)
Total Protein: 6.1 g/dL — ABNORMAL LOW (ref 6.5–8.1)

## 2018-08-11 LAB — ECHOCARDIOGRAM COMPLETE
Height: 61 in
Weight: 2032 oz

## 2018-08-11 LAB — HIV ANTIBODY (ROUTINE TESTING W REFLEX): HIV Screen 4th Generation wRfx: NONREACTIVE

## 2018-08-11 MED ORDER — LORAZEPAM 1 MG PO TABS
1.0000 mg | ORAL_TABLET | Freq: Four times a day (QID) | ORAL | Status: AC | PRN
  Administered 2018-08-11 – 2018-08-13 (×3): 1 mg via ORAL
  Filled 2018-08-11 (×3): qty 1

## 2018-08-11 MED ORDER — OXYCODONE-ACETAMINOPHEN 5-325 MG PO TABS
1.0000 | ORAL_TABLET | ORAL | Status: DC | PRN
  Administered 2018-08-11 – 2018-08-13 (×7): 1 via ORAL
  Filled 2018-08-11 (×7): qty 1

## 2018-08-11 MED ORDER — REGADENOSON 0.4 MG/5ML IV SOLN
0.4000 mg | Freq: Once | INTRAVENOUS | Status: AC
  Administered 2018-08-11: 0.4 mg via INTRAVENOUS
  Filled 2018-08-11: qty 5

## 2018-08-11 MED ORDER — LORAZEPAM 2 MG/ML IJ SOLN
1.0000 mg | Freq: Four times a day (QID) | INTRAMUSCULAR | Status: AC | PRN
  Administered 2018-08-11 – 2018-08-12 (×2): 1 mg via INTRAVENOUS

## 2018-08-11 MED ORDER — THIAMINE HCL 100 MG/ML IJ SOLN: 100.0000 mg | Freq: Every day | INTRAMUSCULAR | Status: DC

## 2018-08-11 MED ORDER — MAGNESIUM OXIDE 400 (241.3 MG) MG PO TABS
400.0000 mg | ORAL_TABLET | Freq: Every day | ORAL | Status: DC
  Administered 2018-08-11 – 2018-08-14 (×4): 400 mg via ORAL
  Filled 2018-08-11 (×4): qty 1

## 2018-08-11 MED ORDER — ADULT MULTIVITAMIN W/MINERALS CH: 1.0000 | ORAL_TABLET | Freq: Every day | ORAL | Status: DC

## 2018-08-11 MED ORDER — REGADENOSON 0.4 MG/5ML IV SOLN
INTRAVENOUS | Status: AC
  Filled 2018-08-11: qty 5

## 2018-08-11 MED ORDER — TECHNETIUM TC 99M TETROFOSMIN IV KIT
30.0000 | PACK | Freq: Once | INTRAVENOUS | Status: AC | PRN
  Administered 2018-08-11: 30 via INTRAVENOUS

## 2018-08-11 MED ORDER — FOLIC ACID 1 MG PO TABS: 1.0000 mg | ORAL_TABLET | Freq: Every day | ORAL | Status: DC

## 2018-08-11 MED ORDER — FOLIC ACID 1 MG PO TABS
1.0000 mg | ORAL_TABLET | Freq: Every day | ORAL | Status: DC
  Administered 2018-08-11 – 2018-08-14 (×4): 1 mg via ORAL
  Filled 2018-08-11 (×4): qty 1

## 2018-08-11 MED ORDER — ADULT MULTIVITAMIN W/MINERALS CH
1.0000 | ORAL_TABLET | Freq: Every day | ORAL | Status: DC
  Administered 2018-08-11 – 2018-08-14 (×4): 1 via ORAL
  Filled 2018-08-11 (×4): qty 1

## 2018-08-11 MED ORDER — LORAZEPAM 1 MG PO TABS: 1.0000 mg | ORAL_TABLET | Freq: Four times a day (QID) | ORAL | Status: AC | PRN

## 2018-08-11 MED ORDER — TECHNETIUM TC 99M TETROFOSMIN IV KIT
10.0000 | PACK | Freq: Once | INTRAVENOUS | Status: AC | PRN
  Administered 2018-08-11: 10 via INTRAVENOUS

## 2018-08-11 MED ORDER — POTASSIUM CHLORIDE CRYS ER 20 MEQ PO TBCR
40.0000 meq | EXTENDED_RELEASE_TABLET | Freq: Every day | ORAL | Status: DC
  Administered 2018-08-11 – 2018-08-14 (×4): 40 meq via ORAL
  Filled 2018-08-11 (×4): qty 2

## 2018-08-11 MED ORDER — VITAMIN B-1 100 MG PO TABS
100.0000 mg | ORAL_TABLET | Freq: Every day | ORAL | Status: DC
  Administered 2018-08-11 – 2018-08-14 (×4): 100 mg via ORAL
  Filled 2018-08-11 (×4): qty 1

## 2018-08-11 MED ORDER — LORAZEPAM 2 MG/ML IJ SOLN
1.0000 mg | Freq: Four times a day (QID) | INTRAMUSCULAR | Status: AC | PRN
  Filled 2018-08-11 (×2): qty 1

## 2018-08-11 NOTE — Progress Notes (Addendum)
[facility] PROGRESS NOTE  Jazlyn Tippens ZOX:[id number] DOB: 21-Sep-1951 DOA: 08/10/2018 PCP: [facility]  Assessment/Plan:  Chest Pain: atypical.  CP over 2 months.  L sided.  Worse with exertion and better with rest.  Improves with nitro.  Family hx of CAD in mother and father.  EKG without ST-T wave abnormalities suggestive of ischemia. Minimally elevated troponin, utox neg, echo with EF 45% + wall motion abnormalities with distal septal apical and basal inferior wall hypokinesis, moderate MR.  Evaluted by cards recommended stress test that revealed mild regions of inducible ischemia along anterior wall and apicolateral segment of left ventricle. Cardiac cath recommended. Able to do today but patient refused to remain NPO until cath could be scheduled.  -monitor  -cath on Monday -supportive therapy  Hypertension: BP high end of normal amlodipine started by cardiology -continue amlodipine -monitor  HLD:  lipid panel reveals cholesterol 233 and LDL 129  Hypokalemia: replete, follow magnesium replete as indicated -magnesium 1.8 -will replete and monitor  Bipolar Disorder: stable, continue home meds  Hx Tobacco Abuse: pt interested in quitting.  Nicotine patch.  Interested in chantix, can discuss prescription at discharge.   ETOH abuse: reports consumption of 2-3 "small" bottles of wine daily. Last drink yesterday  Code Status: full Family Communication: none Disposition Plan: home in 2 days   Consultants:  nishan card  Procedures:  Stress test  Antibiotics:    HPI/Subjective: Abeni Finchum is a 67 y.o. female with medical history significant of bipolar, hypertension, tobacco abuse, asthma, and multiple other medical problems who is presenting with chest pain.   Complains of inability to sleep. Requesting pain med Objective: Vitals:   08/11/18 0937 08/11/18 0939  BP: (!) 148/91 135/89  Pulse: (!) 101 97  Resp:    Temp:    SpO2:      No intake or output data in the 24 hours ending 08/11/18 1524 Filed Weights   08/10/18 0953  Weight: 57.6 kg    Exam:   General:  Thin appears chronically ill  Cardiovascular: RRR no MGR no LE edema  Respiratory: normal effort BS clear bilaterally no wheeze or rhonchi  Abdomen: obese soft +BS non-tender to palpation no guarding or rebounding  Musculoskeletal: joints without swelling/erythema   Data Reviewed: Basic Metabolic Panel: Recent Labs  Lab 08/10/18 0959 08/10/18 2228 08/11/18 0001  NA 142  --  140  K 2.8*  --  3.6  CL 108  --  107  CO2 26  --  24  GLUCOSE 93  --  102*  BUN 10  --  12  CREATININE 0.93  --  1.13*  CALCIUM 9.9  --  9.5  MG  --  1.8  --    Liver Function Tests: Recent Labs  Lab 08/11/18 0001  AST 19  ALT 11  ALKPHOS 71  BILITOT 0.5  PROT 6.1*  ALBUMIN 3.3*   No results for input(s): LIPASE, AMYLASE in the last 168 hours. No results for input(s): AMMONIA in the last 168 hours. CBC: Recent Labs  Lab 08/10/18 0959 08/11/18 0001  WBC 5.4 6.0  HGB 14.5 13.2  HCT 44.9 39.2  MCV 95.3 94.0  PLT 235 215   Cardiac Enzymes: Recent Labs  Lab 08/10/18 1621 08/10/18 2228  TROPONINI 0.04* 0.04*   BNP (last 3 results) No results for input(s): BNP in the last 8760 hours.  ProBNP (last 3 results) No results for input(s): PROBNP in the last 8760 hours.  CBG: No results  for input(s): GLUCAP in the last 168 hours.  No results found for this or any previous visit (from the past 240 hour(s)).   Studies: Dg Chest 2 View  Result Date: 08/10/2018 CLINICAL DATA:  Dull, intermittent chest pain and shortness of breath for 2 months. EXAM: CHEST - 2 VIEW COMPARISON:  PA and lateral chest 04/04/2017 and 05/23/2014. FINDINGS: The lungs are clear. There is cardiomegaly. Aortic atherosclerosis is noted. No pneumothorax or pleural fluid. No acute or focal bony abnormality. IMPRESSION: No acute disease. Cardiomegaly. Atherosclerosis.  Electronically Signed   By: Drusilla Kanner M.D.   On: 08/10/2018 10:51   Nm Myocar Multi W/spect W/wall Motion / Ef  Result Date: 08/11/2018 CLINICAL DATA:  Chest pain. Stroke. Tobacco use. Hypertension. Family history of heart disease. Coronary artery disease. Asthma. EXAM: MYOCARDIAL IMAGING WITH SPECT (REST AND PHARMACOLOGIC-STRESS) GATED LEFT VENTRICULAR WALL MOTION STUDY LEFT VENTRICULAR EJECTION FRACTION TECHNIQUE: Standard myocardial SPECT imaging was performed after resting intravenous injection of 10 mCi Tc-72m tetrofosmin. Subsequently, intravenous infusion of Lexiscan was performed under the supervision of the Cardiology staff. At peak effect of the drug, 30 mCi Tc-2m tetrofosmin was injected intravenously and standard myocardial SPECT imaging was performed. Quantitative gated imaging was also performed to evaluate left ventricular wall motion, and estimate left ventricular ejection fraction. COMPARISON:  Chest radiograph 08/10/2018 FINDINGS: Perfusion: Matched defects in the cardiac apex and along portions of the lateral wall. Borderline appearance for a small region of inducible ischemia along the apicolateral segment. Reverse redistribution along the inferior wall and inferolateral wall. Small region of mild suspected inducible ischemia along the anterior wall in the mid heart. Wall Motion: Moderate hypokinesis and poor wall thickening in the inferior and lateral walls. Left Ventricular Ejection Fraction: 40 % End diastolic volume 119 ml (moderately elevated) End systolic volume 71 ml (prominently elevated) IMPRESSION: 1. Mild regions of inducible ischemia along the anterior wall, and along the apicolateral segment of the left ventricle. Matched defects in the cardiac apex and lateral wall. Reverse redistribution appearance in the inferior and inferolateral walls. End diastolic volume is moderately increased and in systolic volume is considerably increased. 2. Moderate hypokinesis and poor wall  thickening in the inferior and lateral walls. 3. Left ventricular ejection fraction 40% 4. Non invasive risk stratification*: High *2012 Appropriate Use Criteria for Coronary Revascularization Focused Update: J Am Coll Cardiol. 2012;59(9):857-881. http://content.dementiazones.com.aspx?articleid=[email or web address] Electronically Signed   By: Gaylyn Rong M.D.   On: 08/11/2018 14:20    Scheduled Meds: . amLODipine  5 mg Oral Daily  . Brexpiprazole  1 mg Oral QHS  . enoxaparin (LOVENOX) injection  40 mg Subcutaneous Q24H  . folic acid  1 mg Oral Daily  . magnesium oxide  400 mg Oral Daily  . mirtazapine  7.5 mg Oral QHS  . multivitamin with minerals  1 tablet Oral Daily  . nicotine  14 mg Transdermal Daily  . potassium chloride  40 mEq Oral Daily  . regadenoson      . thiamine  100 mg Oral Daily   Or  . thiamine  100 mg Intravenous Daily  . Valbenazine Tosylate  40 mg Oral BH-q7a   Continuous Infusions:  Principal Problem:   Chest pain with high risk for cardiac etiology Active Problems:   HYPERTENSION, BENIGN ESSENTIAL   Noncompliance   Anxiety   Tobacco abuse   Schizophrenia, paranoid type (HCC)   Hypokalemia    Time spent: 45 minutes    [facility] M  [facility]  If 7PM-7AM,  please contact night-coverage at [email or web address], password [facility] 08/11/2018, 3:24 PM  LOS: 0 days

## 2018-08-11 NOTE — H&P (View-Only) (Signed)
Progress Note  Patient Name: Danielle [name] Date of Encounter: 08/11/2018  Primary Cardiologist: Charlton Haws, MD  Subjective   Pt seen down in nuc med, tolerated well. Denies excessive or daily ETOH use. No further CP. Utox neg. Does feel occ palpitations (tele with freq PVCs and brief couplets/triplets).  Inpatient Medications    Scheduled Meds: . amLODipine  5 mg Oral Daily  . Brexpiprazole  1 mg Oral QHS  . enoxaparin (LOVENOX) injection  40 mg Subcutaneous Q24H  . mirtazapine  7.5 mg Oral QHS  . nicotine  14 mg Transdermal Daily  . Valbenazine Tosylate  40 mg Oral BH-q7a   Continuous Infusions:  PRN Meds: hydrOXYzine, nitroGLYCERIN   Vital Signs    Vitals:   08/10/18 1645 08/10/18 1735 08/10/18 2327 08/11/18 0525  BP: (!) 157/94 (!) 144/96 (!) 151/83 133/89  Pulse: 85 81 91 80  Resp: (!) 21 20 16 16   Temp:  98.1 F (36.7 C) 98.2 F (36.8 C) 98.4 F (36.9 C)  TempSrc:  Oral Oral Oral  SpO2: 99% 97% 98% 100%  Weight:      Height:       No intake or output data in the 24 hours ending 08/11/18 0838 Filed Weights   08/10/18 0953  Weight: 57.6 kg    Telemetry    NSR with freq PVCs, couplets, triplets - multifocal - Personally Reviewed  Physical Exam   GEN: No acute distress.  HEENT: Normocephalic, atraumatic, sclera non-icteric. Neck: No JVD or bruits. Cardiac: RRR freq ectopy no murmurs, rubs, or gallops.  Radials/DP/PT 1+ and equal bilaterally.  Respiratory: Clear to auscultation bilaterally. Breathing is unlabored. GI: Soft, nontender, non-distended, BS +x 4. MS: no deformity. Extremities: No clubbing or cyanosis. No edema. Distal pedal pulses are 2+ and equal bilaterally. Neuro:  AAOx3. Follows commands. Tardive dyskinesia of mouth Psych:  Responds to questions appropriately with a flat affect.  Labs    Chemistry Recent Labs  Lab 08/10/18 0959 08/11/18 0001  NA 142 140  K 2.8* 3.6  CL 108 107  CO2 26 24  GLUCOSE 93 102*  BUN 10  12  CREATININE 0.93 1.13*  CALCIUM 9.9 9.5  PROT  --  6.1*  ALBUMIN  --  3.3*  AST  --  19  ALT  --  11  ALKPHOS  --  71  BILITOT  --  0.5  GFRNONAA >60 50*  GFRAA >60 58*  ANIONGAP 8 9     Hematology Recent Labs  Lab 08/10/18 0959 08/11/18 0001  WBC 5.4 6.0  RBC 4.71 4.17  HGB 14.5 13.2  HCT 44.9 39.2  MCV 95.3 94.0  MCH 30.8 31.7  MCHC 32.3 33.7  RDW 12.1 12.2  PLT 235 215    Cardiac Enzymes Recent Labs  Lab 08/10/18 1621 08/10/18 2228  TROPONINI 0.04* 0.04*    Recent Labs  Lab 08/10/18 1006  TROPIPOC 0.04     BNPNo results for input(s): BNP, PROBNP in the last 168 hours.   DDimer No results for input(s): DDIMER in the last 168 hours.   Radiology    Dg Chest 2 View  Result Date: 08/10/2018 CLINICAL DATA:  Dull, intermittent chest pain and shortness of breath for 2 months. EXAM: CHEST - 2 VIEW COMPARISON:  PA and lateral chest 04/04/2017 and 05/23/2014. FINDINGS: The lungs are clear. There is cardiomegaly. Aortic atherosclerosis is noted. No pneumothorax or pleural fluid. No acute or focal bony abnormality. IMPRESSION: No acute disease. Cardiomegaly. Atherosclerosis.  Electronically Signed   By: Drusilla Kanner M.D.   On: 08/10/2018 10:51    Cardiac Studies   Pending  Patient Profile     67 y.o. female with HTN, HLD, habitual alcohol intake, prior crack cocaine use outlined in chart, bipolar disorder/schizophrenia, tardive dyskinesia, asthma, and tobacco abuse, who presented with chest pain x2 months.  Assessment & Plan    1. Chest pain with minimally elevated troponin - atypical symptoms per MD, plan Lexiscan nuclear stress test today. Utox neg.  2. Frequent PVCs/couplets/triplets - goal K 4.0+, Mg 2.0+. Start KCl daily and MagOx 400mg  daily. Will obtain echocardiogram for completeness as well. Consider addition of beta blocker. May need OP Holter for quantification.  3. HTN - improved with addition of amlodipine, follow.  4.  Hyperlipidemia - consider statin initiation based on results of test.  5. Hypokalemia - see above.  6. Tobacco abuse/habitual ETOH intake - cessation advised for tobacco and recommended to cut down ETOH.  7. Bipolar d/o - per primary team.  For questions or updates, please contact [facility] Please consult [email or web address] for contact info under Cardiology/STEMI.  Signed, Laurann Montana, PA-C 08/11/2018, 8:38 AM    Patient examined chart reviewed. Black female with tardive dyskinesia. Lungs clear no murmur abdomen soft no edema. For nuclear study today Pain atypical Agree with TTE for RV/LV EF given PVCls on telemetry .   Charlton Haws

## 2018-08-11 NOTE — Progress Notes (Addendum)
 Progress Note  Patient Name: Danielle [name] Date of Encounter: 08/11/2018  Primary Cardiologist: Damarrion Mimbs, MD  Subjective   Pt seen down in nuc med, tolerated well. Denies excessive or daily ETOH use. No further CP. Utox neg. Does feel occ palpitations (tele with freq PVCs and brief couplets/triplets).  Inpatient Medications    Scheduled Meds: . amLODipine  5 mg Oral Daily  . Brexpiprazole  1 mg Oral QHS  . enoxaparin (LOVENOX) injection  40 mg Subcutaneous Q24H  . mirtazapine  7.5 mg Oral QHS  . nicotine  14 mg Transdermal Daily  . Valbenazine Tosylate  40 mg Oral BH-q7a   Continuous Infusions:  PRN Meds: hydrOXYzine, nitroGLYCERIN   Vital Signs    Vitals:   08/10/18 1645 08/10/18 1735 08/10/18 2327 08/11/18 0525  BP: (!) 157/94 (!) 144/96 (!) 151/83 133/89  Pulse: 85 81 91 80  Resp: (!) 21 20 16 16  Temp:  98.1 F (36.7 C) 98.2 F (36.8 C) 98.4 F (36.9 C)  TempSrc:  Oral Oral Oral  SpO2: 99% 97% 98% 100%  Weight:      Height:       No intake or output data in the 24 hours ending 08/11/18 0838 Filed Weights   08/10/18 0953  Weight: 57.6 kg    Telemetry    NSR with freq PVCs, couplets, triplets - multifocal - Personally Reviewed  Physical Exam   GEN: No acute distress.  HEENT: Normocephalic, atraumatic, sclera non-icteric. Neck: No JVD or bruits. Cardiac: RRR freq ectopy no murmurs, rubs, or gallops.  Radials/DP/PT 1+ and equal bilaterally.  Respiratory: Clear to auscultation bilaterally. Breathing is unlabored. GI: Soft, nontender, non-distended, BS +x 4. MS: no deformity. Extremities: No clubbing or cyanosis. No edema. Distal pedal pulses are 2+ and equal bilaterally. Neuro:  AAOx3. Follows commands. Tardive dyskinesia of mouth Psych:  Responds to questions appropriately with a flat affect.  Labs    Chemistry Recent Labs  Lab 08/10/18 0959 08/11/18 0001  NA 142 140  K 2.8* 3.6  CL 108 107  CO2 26 24  GLUCOSE 93 102*  BUN 10  12  CREATININE 0.93 1.13*  CALCIUM 9.9 9.5  PROT  --  6.1*  ALBUMIN  --  3.3*  AST  --  19  ALT  --  11  ALKPHOS  --  71  BILITOT  --  0.5  GFRNONAA >60 50*  GFRAA >60 58*  ANIONGAP 8 9     Hematology Recent Labs  Lab 08/10/18 0959 08/11/18 0001  WBC 5.4 6.0  RBC 4.71 4.17  HGB 14.5 13.2  HCT 44.9 39.2  MCV 95.3 94.0  MCH 30.8 31.7  MCHC 32.3 33.7  RDW 12.1 12.2  PLT 235 215    Cardiac Enzymes Recent Labs  Lab 08/10/18 1621 08/10/18 2228  TROPONINI 0.04* 0.04*    Recent Labs  Lab 08/10/18 1006  TROPIPOC 0.04     BNPNo results for input(s): BNP, PROBNP in the last 168 hours.   DDimer No results for input(s): DDIMER in the last 168 hours.   Radiology    Dg Chest 2 View  Result Date: 08/10/2018 CLINICAL DATA:  Dull, intermittent chest pain and shortness of breath for 2 months. EXAM: CHEST - 2 VIEW COMPARISON:  PA and lateral chest 04/04/2017 and 05/23/2014. FINDINGS: The lungs are clear. There is cardiomegaly. Aortic atherosclerosis is noted. No pneumothorax or pleural fluid. No acute or focal bony abnormality. IMPRESSION: No acute disease. Cardiomegaly. Atherosclerosis.   Electronically Signed   By: Thomas  Dalessio M.D.   On: 08/10/2018 10:51    Cardiac Studies   Pending  Patient Profile     67 y.o. female with HTN, HLD, habitual alcohol intake, prior crack cocaine use outlined in chart, bipolar disorder/schizophrenia, tardive dyskinesia, asthma, and tobacco abuse, who presented with chest pain x2 months.  Assessment & Plan    1. Chest pain with minimally elevated troponin - atypical symptoms per MD, plan Lexiscan nuclear stress test today. Utox neg.  2. Frequent PVCs/couplets/triplets - goal K 4.0+, Mg 2.0+. Start KCl 40meq daily and MagOx 400mg daily. Will obtain echocardiogram for completeness as well. Consider addition of beta blocker. May need OP Holter for quantification.  3. HTN - improved with addition of amlodipine, follow.  4.  Hyperlipidemia - consider statin initiation based on results of test.  5. Hypokalemia - see above.  6. Tobacco abuse/habitual ETOH intake - cessation advised for tobacco and recommended to cut down ETOH.  7. Bipolar d/o - per primary team.  For questions or updates, please contact [facility] Please consult [email or web address] for contact info under Cardiology/STEMI.  Signed, Dayna N Dunn, PA-C 08/11/2018, 8:38 AM    Patient examined chart reviewed. Black female with tardive dyskinesia. Lungs clear no murmur abdomen soft no edema. For nuclear study today Pain atypical Agree with TTE for RV/LV EF given PVCls on telemetry .   [name]   

## 2018-08-11 NOTE — Care Management Obs Status (Signed)
MEDICARE OBSERVATION STATUS NOTIFICATION   Patient Details  Name: Danielle [name] MRN: [id number] Date of Birth: 10-Feb-1951   Medicare Observation Status Notification Given:  Yes    Oletta Cohn, RN 08/11/2018, 12:22 PM

## 2018-08-11 NOTE — Progress Notes (Signed)
  Echocardiogram 2D Echocardiogram has been performed.  Belva Chimes 08/11/2018, 12:09 PM

## 2018-08-11 NOTE — Progress Notes (Signed)
   [name] presented for a nuclear stress test today.  No immediate complications.  Stress imaging is pending at this time.  Preliminary EKG findings may be listed in the chart, but the stress test result will not be finalized until perfusion imaging is complete.  Laurann Montana, PA-C 08/11/2018, 9:46 AM

## 2018-08-11 NOTE — Progress Notes (Signed)
   2D echo showed EF 45-50% with + wall motion abnormalities with distal septal apical and basal inferior wall hypokinesis, eccentric MR with restricted posterior leaflet motion with moderate MR, mildly dilated LA, with Some turbulent flow seen in distal PA on basal SA views. Nuclear stress test has not yet formally resulted but Dr. Eden Emms contacted me to let me know it is abnormal and he recommends right and left cardiac catheterization. Risks and benefits of cardiac catheterization have been discussed with the patient in person. These include bleeding, infection, kidney damage, stroke, heart attack, death. The patient understands these risks and is willing to proceed. However, she is absolutely unwilling to remain NPO any longer this afternoon for the procedure. Therefore it will have to occur on Monday 10/28. I have added her to the board and will write orders. Clinically she does not appear volume overloaded. Will adjust IV fluids for 100/hr starting at 4am day of procedure (scheduled for 7:30 on Monday with Dr. Herbie Baltimore). The weekend team should f/u on BNP ordered for AM and adjust fluids if needed.  I also discussed in depth with daughter Suzette Battiest on the phone at the patient's request as well. Nurse also updated.  Cath lab raised concern of whether patient should be on CIWA given mention of ETOH; patient was not really clear with use today but yesterday reported 2 small bottles of wine per week. I reviewed with Dr. Benjamine Mola who plans to review with patient and make the decision.  Grayce Budden PA-C

## 2018-08-12 DIAGNOSIS — F419 Anxiety disorder, unspecified: Secondary | ICD-10-CM | POA: Diagnosis not present

## 2018-08-12 DIAGNOSIS — R079 Chest pain, unspecified: Secondary | ICD-10-CM | POA: Diagnosis not present

## 2018-08-12 DIAGNOSIS — Z9119 Patient's noncompliance with other medical treatment and regimen: Secondary | ICD-10-CM

## 2018-08-12 DIAGNOSIS — E876 Hypokalemia: Secondary | ICD-10-CM | POA: Diagnosis not present

## 2018-08-12 LAB — BASIC METABOLIC PANEL
Anion gap: 8 (ref 5–15)
BUN: 21 mg/dL (ref 8–23)
CO2: 21 mmol/L — ABNORMAL LOW (ref 22–32)
Calcium: 9.8 mg/dL (ref 8.9–10.3)
Chloride: 107 mmol/L (ref 98–111)
Creatinine, Ser: 1.03 mg/dL — ABNORMAL HIGH (ref 0.44–1.00)
GFR calc Af Amer: >60 mL/min (ref >60)
GFR calc non Af Amer: 56 mL/min — ABNORMAL LOW (ref >60)
Glucose, Bld: 107 mg/dL — ABNORMAL HIGH (ref 70–99)
Potassium: 3.9 mmol/L (ref 3.5–5.1)
Sodium: 136 mmol/L (ref 135–145)

## 2018-08-12 LAB — CBC
HCT: 41.7 % (ref 36.0–46.0)
Hemoglobin: 14.1 g/dL (ref 12.0–15.0)
MCH: 31.8 pg (ref 26.0–34.0)
MCHC: 33.8 g/dL (ref 30.0–36.0)
MCV: 94.1 fL (ref 80.0–100.0)
Platelets: 214 10*3/uL (ref 150–400)
RBC: 4.43 MIL/uL (ref 3.87–5.11)
RDW: 11.9 % (ref 11.5–15.5)
WBC: 5.8 10*3/uL (ref 4.0–10.5)
nRBC: 0 % (ref 0.0–0.2)

## 2018-08-12 LAB — MAGNESIUM: Magnesium: 2.1 mg/dL (ref 1.7–2.4)

## 2018-08-12 LAB — BRAIN NATRIURETIC PEPTIDE: B Natriuretic Peptide: 115.5 pg/mL — ABNORMAL HIGH (ref 0.0–100.0)

## 2018-08-12 MED ORDER — CARVEDILOL 3.125 MG PO TABS
3.1250 mg | ORAL_TABLET | Freq: Two times a day (BID) | ORAL | Status: DC
  Administered 2018-08-12 – 2018-08-14 (×4): 3.125 mg via ORAL
  Filled 2018-08-12 (×4): qty 1

## 2018-08-12 MED ORDER — LORAZEPAM 2 MG/ML IJ SOLN
0.5000 mg | Freq: Four times a day (QID) | INTRAMUSCULAR | Status: DC
  Administered 2018-08-12 – 2018-08-13 (×4): 0.5 mg via INTRAVENOUS
  Filled 2018-08-12 (×4): qty 1

## 2018-08-12 MED ORDER — ASPIRIN 81 MG PO CHEW
81.0000 mg | CHEWABLE_TABLET | Freq: Once | ORAL | Status: AC
  Administered 2018-08-12: 81 mg via ORAL
  Filled 2018-08-12: qty 1

## 2018-08-12 NOTE — Progress Notes (Signed)
[facility] PROGRESS NOTE  Danielle [name] ZOX:[id number] DOB: 06/06/1951 DOA: 08/10/2018 [facility]CP: [facility]  Assessment/Plan: Chest Pain: atypical. Continues to complain of chest pain "under left breast". Pain sharp, intermittent. Relieved with Percocet. Troponin mildly elevated but flat CP over 2 months. L sided. Worse with exertion and better with rest. Awaiting cath on 10/28 -defer further management per cardiology -monitor  -cath on Monday -supportive therapy  Hypertension: BP remains high end of normal. Home meds include amlodipine. May be related to early ETOH withdrawal. Currently on PRN CIWA -provide low dose IV ativan -continue amlodipine -monitor closely  HLD:  lipid panel reveals cholesterol 233 and LDL 129  Hypokalemia: repleted and potassium level up to 3.6 yesterday. Today's level pending.  follow magnesium replete as indicated -magnesium 1.8 -will replete and monitor  Bipolar Disorder:stable, continue home meds  Hx Tobacco Abuse:pt interested in quitting. Nicotine patch. Interested in chantix, can discuss prescription at discharge.   ETOH abuse: reports consumption of 2-3 "small" bottles of wine daily. Last drink yesterday. Complains of anxiety. CIWA score trending up as well as HR and BP -continue CIWA -add low dose scheduled IV ativan -monitor closely  Code Status: full Family Communication: none present Disposition Plan: to be determined   Consultants:  [facility]  Procedures:  Stress test  Antibiotics:    HPI/Subjective: Ambulating in room with fairly steady gait. Complains of "nervousness" and sharp pain under left breast  Abnormal stress test and awaiting cath scheduled for 10/28. ETOH abuser and on CIWA  Objective: Vitals:   08/11/18 2343 08/12/18 0630  BP: (!) 141/84 (!) 152/99  Pulse: 92 100  Resp: 16 19  Temp: 98.3 F (36.8 C) 97.9 F (36.6 C)  SpO2: 99% 98%   No intake or output data in  the 24 hours ending 08/12/18 0914 Filed Weights   08/10/18 0953  Weight: 57.6 kg    Exam:   General:  Alert oriented slightly lethargic  Cardiovascular: RRR no MGR no LE edema  Respiratory: normal effort BS clear but distant no wheeze or crackles  Abdomen: obese soft +BS throughout no guarding or rebounding  Musculoskeletal: joints without swelling/erythema   Data Reviewed: Basic Metabolic Panel: Recent Labs  Lab 08/10/18 0959 08/10/18 2228 08/11/18 0001  NA 142  --  140  K 2.8*  --  3.6  CL 108  --  107  CO2 26  --  24  GLUCOSE 93  --  102*  BUN 10  --  12  CREATININE 0.93  --  1.13*  CALCIUM 9.9  --  9.5  MG  --  1.8  --    Liver Function Tests: Recent Labs  Lab 08/11/18 0001  AST 19  ALT 11  ALKPHOS 71  BILITOT 0.5  PROT 6.1*  ALBUMIN 3.3*   No results for input(s): LIPASE, AMYLASE in the last 168 hours. No results for input(s): AMMONIA in the last 168 hours. CBC: Recent Labs  Lab 08/10/18 0959 08/11/18 0001 08/12/18 0706  WBC 5.4 6.0 5.8  HGB 14.5 13.2 14.1  HCT 44.9 39.2 41.7  MCV 95.3 94.0 94.1  PLT 235 215 214   Cardiac Enzymes: Recent Labs  Lab 08/10/18 1621 08/10/18 2228  TROPONINI 0.04* 0.04*   BNP (last 3 results) Recent Labs    08/12/18 0706  BNP 115.5*    ProBNP (last 3 results) No results for input(s): PROBNP in the last 8760 hours.  CBG: No results for input(s): GLUCAP in the last 168  hours.  No results found for this or any previous visit (from the past 240 hour(s)).   Studies: Dg Chest 2 View  Result Date: 08/10/2018 CLINICAL DATA:  Dull, intermittent chest pain and shortness of breath for 2 months. EXAM: CHEST - 2 VIEW COMPARISON:  PA and lateral chest 04/04/2017 and 05/23/2014. FINDINGS: The lungs are clear. There is cardiomegaly. Aortic atherosclerosis is noted. No pneumothorax or pleural fluid. No acute or focal bony abnormality. IMPRESSION: No acute disease. Cardiomegaly. Atherosclerosis. Electronically  Signed   By: Drusilla Kanner M.D.   On: 08/10/2018 10:51   Nm Myocar Multi W/spect W/wall Motion / Ef  Result Date: 08/11/2018 CLINICAL DATA:  Chest pain. Stroke. Tobacco use. Hypertension. Family history of heart disease. Coronary artery disease. Asthma. EXAM: MYOCARDIAL IMAGING WITH SPECT (REST AND PHARMACOLOGIC-STRESS) GATED LEFT VENTRICULAR WALL MOTION STUDY LEFT VENTRICULAR EJECTION FRACTION TECHNIQUE: Standard myocardial SPECT imaging was performed after resting intravenous injection of 10 mCi Tc-16m tetrofosmin. Subsequently, intravenous infusion of Lexiscan was performed under the supervision of the Cardiology staff. At peak effect of the drug, 30 mCi Tc-46m tetrofosmin was injected intravenously and standard myocardial SPECT imaging was performed. Quantitative gated imaging was also performed to evaluate left ventricular wall motion, and estimate left ventricular ejection fraction. COMPARISON:  Chest radiograph 08/10/2018 FINDINGS: Perfusion: Matched defects in the cardiac apex and along portions of the lateral wall. Borderline appearance for a small region of inducible ischemia along the apicolateral segment. Reverse redistribution along the inferior wall and inferolateral wall. Small region of mild suspected inducible ischemia along the anterior wall in the mid heart. Wall Motion: Moderate hypokinesis and poor wall thickening in the inferior and lateral walls. Left Ventricular Ejection Fraction: 40 % End diastolic volume 119 ml (moderately elevated) End systolic volume 71 ml (prominently elevated) IMPRESSION: 1. Mild regions of inducible ischemia along the anterior wall, and along the apicolateral segment of the left ventricle. Matched defects in the cardiac apex and lateral wall. Reverse redistribution appearance in the inferior and inferolateral walls. End diastolic volume is moderately increased and in systolic volume is considerably increased. 2. Moderate hypokinesis and poor wall thickening in  the inferior and lateral walls. 3. Left ventricular ejection fraction 40% 4. Non invasive risk stratification*: High *2012 Appropriate Use Criteria for Coronary Revascularization Focused Update: J Am Coll Cardiol. 2012;59(9):857-881. http://content.dementiazones.com.aspx?articleid=1201161 Electronically Signed   By: Gaylyn Rong M.D.   On: 08/11/2018 14:20    Scheduled Meds: . amLODipine  5 mg Oral Daily  . Brexpiprazole  1 mg Oral QHS  . enoxaparin (LOVENOX) injection  40 mg Subcutaneous Q24H  . folic acid  1 mg Oral Daily  . LORazepam  0.5 mg Intravenous Q6H  . magnesium oxide  400 mg Oral Daily  . mirtazapine  7.5 mg Oral QHS  . multivitamin with minerals  1 tablet Oral Daily  . nicotine  14 mg Transdermal Daily  . potassium chloride  40 mEq Oral Daily  . thiamine  100 mg Oral Daily   Or  . thiamine  100 mg Intravenous Daily  . Valbenazine Tosylate  40 mg Oral BH-q7a   Continuous Infusions:  Principal Problem:   Chest pain with high risk for cardiac etiology Active Problems:   HYPERTENSION, BENIGN ESSENTIAL   Noncompliance   Anxiety   Tobacco abuse   Schizophrenia, paranoid type (HCC)   Hypokalemia    Time spent: 35 minutes    [facility] M  [facility]  If 7PM-7AM, please contact night-coverage at [email or web address], password  [email or web address] 08/12/2018, 9:14 AM  LOS: 0 days

## 2018-08-12 NOTE — Plan of Care (Signed)
Pt alert, oriented, and in no apparent distress. Mild anxiety noted and moderate pain reported. Otherwise, progressing positively toward discharge.

## 2018-08-12 NOTE — Progress Notes (Signed)
Pt briefly tachcardic in 120's, paged provider, resolved now, no new orders at this time, will continue to monitor

## 2018-08-12 NOTE — Progress Notes (Signed)
Discussed with provider pt's plan of care. Per provider, pt is to remain in OBS on floor at this time, criteria for inpatient may change depending on pt's need for IV ativan (CIWA scores)

## 2018-08-12 NOTE — Progress Notes (Signed)
Patient admitted with chest pain. Abnormal nuclear stress, echo with low normal to mildly decreased LV function with WMAs. Plans for cath on Monday. With mild LV dysfunction and PVCs, start coreg 3.125mg  bid. Could consider ACE-I after cath, may require statin pending findings. Mildly elevated BNP, would not change IVFs planned prior to procedure.   No additonal recs over the weekend, call with questions. Plan for cath Monday. If recurrent pain over the weekend would start hep gtt.   Dominga Ferry MD

## 2018-08-13 DIAGNOSIS — F200 Paranoid schizophrenia: Secondary | ICD-10-CM | POA: Diagnosis present

## 2018-08-13 DIAGNOSIS — J45909 Unspecified asthma, uncomplicated: Secondary | ICD-10-CM | POA: Diagnosis present

## 2018-08-13 DIAGNOSIS — Z882 Allergy status to sulfonamides status: Secondary | ICD-10-CM | POA: Diagnosis not present

## 2018-08-13 DIAGNOSIS — G2401 Drug induced subacute dyskinesia: Secondary | ICD-10-CM | POA: Diagnosis present

## 2018-08-13 DIAGNOSIS — E876 Hypokalemia: Secondary | ICD-10-CM | POA: Diagnosis present

## 2018-08-13 DIAGNOSIS — R0789 Other chest pain: Secondary | ICD-10-CM | POA: Diagnosis not present

## 2018-08-13 DIAGNOSIS — Z823 Family history of stroke: Secondary | ICD-10-CM | POA: Diagnosis not present

## 2018-08-13 DIAGNOSIS — Z9119 Patient's noncompliance with other medical treatment and regimen: Secondary | ICD-10-CM | POA: Diagnosis not present

## 2018-08-13 DIAGNOSIS — F419 Anxiety disorder, unspecified: Secondary | ICD-10-CM | POA: Diagnosis not present

## 2018-08-13 DIAGNOSIS — Z8249 Family history of ischemic heart disease and other diseases of the circulatory system: Secondary | ICD-10-CM | POA: Diagnosis not present

## 2018-08-13 DIAGNOSIS — Z881 Allergy status to other antibiotic agents status: Secondary | ICD-10-CM | POA: Diagnosis not present

## 2018-08-13 DIAGNOSIS — F17210 Nicotine dependence, cigarettes, uncomplicated: Secondary | ICD-10-CM | POA: Diagnosis present

## 2018-08-13 DIAGNOSIS — Z888 Allergy status to other drugs, medicaments and biological substances status: Secondary | ICD-10-CM | POA: Diagnosis not present

## 2018-08-13 DIAGNOSIS — N189 Chronic kidney disease, unspecified: Secondary | ICD-10-CM | POA: Diagnosis present

## 2018-08-13 DIAGNOSIS — Z880 Allergy status to penicillin: Secondary | ICD-10-CM | POA: Diagnosis not present

## 2018-08-13 DIAGNOSIS — Z90710 Acquired absence of both cervix and uterus: Secondary | ICD-10-CM | POA: Diagnosis not present

## 2018-08-13 DIAGNOSIS — R079 Chest pain, unspecified: Secondary | ICD-10-CM | POA: Diagnosis not present

## 2018-08-13 DIAGNOSIS — I10 Essential (primary) hypertension: Secondary | ICD-10-CM | POA: Diagnosis not present

## 2018-08-13 DIAGNOSIS — I25110 Atherosclerotic heart disease of native coronary artery with unstable angina pectoris: Secondary | ICD-10-CM | POA: Diagnosis present

## 2018-08-13 DIAGNOSIS — Z8673 Personal history of transient ischemic attack (TIA), and cerebral infarction without residual deficits: Secondary | ICD-10-CM | POA: Diagnosis not present

## 2018-08-13 DIAGNOSIS — I200 Unstable angina: Secondary | ICD-10-CM | POA: Diagnosis not present

## 2018-08-13 DIAGNOSIS — R9439 Abnormal result of other cardiovascular function study: Secondary | ICD-10-CM | POA: Diagnosis not present

## 2018-08-13 DIAGNOSIS — I493 Ventricular premature depolarization: Secondary | ICD-10-CM | POA: Diagnosis present

## 2018-08-13 DIAGNOSIS — E785 Hyperlipidemia, unspecified: Secondary | ICD-10-CM | POA: Diagnosis present

## 2018-08-13 DIAGNOSIS — G4700 Insomnia, unspecified: Secondary | ICD-10-CM | POA: Diagnosis not present

## 2018-08-13 DIAGNOSIS — I129 Hypertensive chronic kidney disease with stage 1 through stage 4 chronic kidney disease, or unspecified chronic kidney disease: Secondary | ICD-10-CM | POA: Diagnosis present

## 2018-08-13 LAB — BASIC METABOLIC PANEL
Anion gap: 9 (ref 5–15)
BUN: 22 mg/dL (ref 8–23)
CO2: 23 mmol/L (ref 22–32)
Calcium: 10.3 mg/dL (ref 8.9–10.3)
Chloride: 102 mmol/L (ref 98–111)
Creatinine, Ser: 1.05 mg/dL — ABNORMAL HIGH (ref 0.44–1.00)
GFR calc Af Amer: >60 mL/min (ref >60)
GFR calc non Af Amer: 55 mL/min — ABNORMAL LOW (ref >60)
Glucose, Bld: 102 mg/dL — ABNORMAL HIGH (ref 70–99)
Potassium: 4.4 mmol/L (ref 3.5–5.1)
Sodium: 134 mmol/L — ABNORMAL LOW (ref 135–145)

## 2018-08-13 LAB — MAGNESIUM: Magnesium: 2.3 mg/dL (ref 1.7–2.4)

## 2018-08-13 MED ORDER — OXYCODONE-ACETAMINOPHEN 5-325 MG PO TABS
1.0000 | ORAL_TABLET | Freq: Three times a day (TID) | ORAL | Status: DC | PRN
  Administered 2018-08-13 – 2018-08-14 (×4): 1 via ORAL
  Filled 2018-08-13 (×4): qty 1

## 2018-08-13 MED ORDER — SODIUM CHLORIDE 0.9 % IV SOLN
INTRAVENOUS | Status: DC
  Administered 2018-08-14: 04:00:00 via INTRAVENOUS

## 2018-08-13 MED ORDER — SODIUM CHLORIDE 0.9 % IV SOLN: 250.0000 mL | INTRAVENOUS | Status: DC | PRN

## 2018-08-13 MED ORDER — LORAZEPAM 2 MG/ML IJ SOLN: 0.5000 mg | Freq: Two times a day (BID) | INTRAMUSCULAR | Status: DC

## 2018-08-13 MED ORDER — LORAZEPAM 2 MG/ML IJ SOLN
0.5000 mg | Freq: Two times a day (BID) | INTRAMUSCULAR | Status: AC
  Administered 2018-08-13 – 2018-08-14 (×2): 0.5 mg via INTRAVENOUS
  Filled 2018-08-13 (×2): qty 1

## 2018-08-13 MED ORDER — ASPIRIN 81 MG PO CHEW
81.0000 mg | CHEWABLE_TABLET | ORAL | Status: AC
  Administered 2018-08-14: 81 mg via ORAL
  Filled 2018-08-13: qty 1

## 2018-08-13 MED ORDER — SODIUM CHLORIDE 0.9% FLUSH
3.0000 mL | Freq: Two times a day (BID) | INTRAVENOUS | Status: DC
  Administered 2018-08-13: 3 mL via INTRAVENOUS

## 2018-08-13 MED ORDER — SODIUM CHLORIDE 0.9% FLUSH: 3.0000 mL | INTRAVENOUS | Status: DC | PRN

## 2018-08-13 NOTE — Progress Notes (Signed)
Bottle of home Valbenazine taken to pharmacy, received from [place].  At pharmacy, bottle lid not on securely, 3 capsules fell on floor.  Pharmacist kept 3 tabs.  This nurse advised pt of dropped medication. Pt state she will take care of medication as she is changing prescriber facility.  Ellyn Hack, pharmacist and advised.

## 2018-08-13 NOTE — Progress Notes (Signed)
[facility] PROGRESS NOTE  Danielle [name] NWG:[id number] DOB: 05-08-1951 DOA: 08/10/2018 [facility]  Assessment/Plan:  Chest Pain:atypical.No complaints this am Troponin mildly elevated but flat.CP over 2 months. L sided. Worse with exertion and better with rest. Awaiting cath on 10/28 -defer further management per cardiology -monitor  -cath on Monday -supportive therapy -if pain recurrs will start hep gtt per cards  ETOH abuse: reports consumption of 2-3 "small" bottles of wine daily. Last drink 10/25. Provided with low dose scheduled ativan yesterday for upward trending  CIWA scores. Quite lethargic this am. CIWA score range 3-11.  -continue CIWA prn -decrease scheduled IV ativan -monitor closely  Hypertension:BP remains high end of normal. Home meds include amlodipine. May be related to early ETOH withdrawal. Currently on PRN CIWA -provide low dose IV ativan as noted above -continue amlodipine -monitor closely  HLD: lipid panel reveals cholesterol 233 and LDL 129  Hypokalemia: repleted and resolved this am. Magnesium 2.3 this am. -continue supplement -monitor closely  Bipolar Disorder:stable, continue home meds  Hx Tobacco Abuse:pt interested in quitting. Nicotine patch. Interested in chantix, can discuss prescription at discharge.    Family Communication: none present Disposition Plan: home hopefully tomorrow   Consultants:  [facility]  Procedures:  Stress test  Antibiotics:    HPI/Subjective: Lying in bed awakes to soft touch. Quite lethargic. Denies pain/discomfort. Reports "im hungry"  Objective: Vitals:   08/13/18 0615 08/13/18 0702  BP:  (!) 132/95  Pulse: 81 96  Resp:  16  Temp:  98.5 F (36.9 C)  SpO2:  98%   No intake or output data in the 24 hours ending 08/13/18 0932 Filed Weights   08/10/18 0953  Weight: 57.6 kg    Exam:   General:  Lethargic but able to follow commands and  make wants and needs known  Cardiovascular: rrr no MGR No LE edema  Respiratory: normal effort Somewhat shallow but good air movement. BS clear bilaterally no wheeze  Abdomen: obese soft +BS no guarding or rebounding  Musculoskeletal: joints without swelling/erythema   Data Reviewed: Basic Metabolic Panel: Recent Labs  Lab 08/10/18 0959 08/10/18 2228 08/11/18 0001 08/12/18 0706 08/13/18 0456  NA 142  --  140 136 134*  K 2.8*  --  3.6 3.9 4.4  CL 108  --  107 107 102  CO2 26  --  24 21* 23  GLUCOSE 93  --  102* 107* 102*  BUN 10  --  12 21 22   CREATININE 0.93  --  1.13* 1.03* 1.05*  CALCIUM 9.9  --  9.5 9.8 10.3  MG  --  1.8  --  2.1 2.3   Liver Function Tests: Recent Labs  Lab 08/11/18 0001  AST 19  ALT 11  ALKPHOS 71  BILITOT 0.5  PROT 6.1*  ALBUMIN 3.3*   No results for input(s): LIPASE, AMYLASE in the last 168 hours. No results for input(s): AMMONIA in the last 168 hours. CBC: Recent Labs  Lab 08/10/18 0959 08/11/18 0001 08/12/18 0706  WBC 5.4 6.0 5.8  HGB 14.5 13.2 14.1  HCT 44.9 39.2 41.7  MCV 95.3 94.0 94.1  PLT 235 215 214   Cardiac Enzymes: Recent Labs  Lab 08/10/18 1621 08/10/18 2228  TROPONINI 0.04* 0.04*   BNP (last 3 results) Recent Labs    08/12/18 0706  BNP 115.5*    ProBNP (last 3 results) No results for input(s): PROBNP in the last 8760 hours.  CBG: No results for input(s): GLUCAP  in the last 168 hours.  No results found for this or any previous visit (from the past 240 hour(s)).   Studies: Nm Myocar Multi W/spect W/wall Motion / Ef  Result Date: 08/11/2018 CLINICAL DATA:  Chest pain. Stroke. Tobacco use. Hypertension. Family history of heart disease. Coronary artery disease. Asthma. EXAM: MYOCARDIAL IMAGING WITH SPECT (REST AND PHARMACOLOGIC-STRESS) GATED LEFT VENTRICULAR WALL MOTION STUDY LEFT VENTRICULAR EJECTION FRACTION TECHNIQUE: Standard myocardial SPECT imaging was performed after resting intravenous injection of  10 mCi Tc-31m tetrofosmin. Subsequently, intravenous infusion of Lexiscan was performed under the supervision of the Cardiology staff. At peak effect of the drug, 30 mCi Tc-80m tetrofosmin was injected intravenously and standard myocardial SPECT imaging was performed. Quantitative gated imaging was also performed to evaluate left ventricular wall motion, and estimate left ventricular ejection fraction. COMPARISON:  Chest radiograph 08/10/2018 FINDINGS: Perfusion: Matched defects in the cardiac apex and along portions of the lateral wall. Borderline appearance for a small region of inducible ischemia along the apicolateral segment. Reverse redistribution along the inferior wall and inferolateral wall. Small region of mild suspected inducible ischemia along the anterior wall in the mid heart. Wall Motion: Moderate hypokinesis and poor wall thickening in the inferior and lateral walls. Left Ventricular Ejection Fraction: 40 % End diastolic volume 119 ml (moderately elevated) End systolic volume 71 ml (prominently elevated) IMPRESSION: 1. Mild regions of inducible ischemia along the anterior wall, and along the apicolateral segment of the left ventricle. Matched defects in the cardiac apex and lateral wall. Reverse redistribution appearance in the inferior and inferolateral walls. End diastolic volume is moderately increased and in systolic volume is considerably increased. 2. Moderate hypokinesis and poor wall thickening in the inferior and lateral walls. 3. Left ventricular ejection fraction 40% 4. Non invasive risk stratification*: High *2012 Appropriate Use Criteria for Coronary Revascularization Focused Update: J Am Coll Cardiol. 2012;59(9):857-881. http://content.dementiazones.com.aspx?articleid=[email or web address] Electronically Signed   By: Gaylyn Rong M.D.   On: 08/11/2018 14:20    Scheduled Meds: . amLODipine  5 mg Oral Daily  . Brexpiprazole  1 mg Oral QHS  . carvedilol  3.125 mg Oral BID WC  .  enoxaparin (LOVENOX) injection  40 mg Subcutaneous Q24H  . folic acid  1 mg Oral Daily  . LORazepam  0.5 mg Intravenous Q12H  . magnesium oxide  400 mg Oral Daily  . mirtazapine  7.5 mg Oral QHS  . multivitamin with minerals  1 tablet Oral Daily  . nicotine  14 mg Transdermal Daily  . potassium chloride  40 mEq Oral Daily  . thiamine  100 mg Oral Daily   Or  . thiamine  100 mg Intravenous Daily  . Valbenazine Tosylate  40 mg Oral BH-q7a   Continuous Infusions:  Principal Problem:   Chest pain with high risk for cardiac etiology Active Problems:   HYPERTENSION, BENIGN ESSENTIAL   Noncompliance   Anxiety   Tobacco abuse   Schizophrenia, paranoid type (HCC)   Hypokalemia    Time spent: 35 minutes    [facility] M NP [facility]  If 7PM-7AM, please contact night-coverage at [email or web address], password [facility] 08/13/2018, 9:32 AM  LOS: 0 days

## 2018-08-13 NOTE — Plan of Care (Signed)
Oriented to room/unit. Call bell in reach. Instructed to call prn needs or other. Cardiac Monitor. Pain Control.

## 2018-08-14 ENCOUNTER — Encounter (HOSPITAL_COMMUNITY): Payer: Self-pay | Admitting: Cardiology

## 2018-08-14 ENCOUNTER — Encounter (HOSPITAL_COMMUNITY)
Admission: EM | Disposition: A | Discharge status: Home / Self Care | Payer: Self-pay | Source: Home / Self Care | Attending: Internal Medicine

## 2018-08-14 DIAGNOSIS — I10 Essential (primary) hypertension: Secondary | ICD-10-CM

## 2018-08-14 DIAGNOSIS — I200 Unstable angina: Secondary | ICD-10-CM

## 2018-08-14 DIAGNOSIS — F419 Anxiety disorder, unspecified: Secondary | ICD-10-CM

## 2018-08-14 DIAGNOSIS — R9439 Abnormal result of other cardiovascular function study: Secondary | ICD-10-CM

## 2018-08-14 HISTORY — PX: RIGHT/LEFT HEART CATH AND CORONARY ANGIOGRAPHY: CATH118266

## 2018-08-14 LAB — POCT I-STAT 3, ART BLOOD GAS (G3+)
Acid-base deficit: 4 mmol/L — ABNORMAL HIGH (ref 0.0–2.0)
Bicarbonate: 20.7 mmol/L (ref 20.0–28.0)
O2 Saturation: 93 %
TCO2: 22 mmol/L (ref 22–32)
pCO2 arterial: 35.6 mmHg (ref 32.0–48.0)
pH, Arterial: 7.374 (ref 7.350–7.450)
pO2, Arterial: 67 mmHg — ABNORMAL LOW (ref 83.0–108.0)

## 2018-08-14 LAB — BASIC METABOLIC PANEL
Anion gap: 8 (ref 5–15)
BUN: 31 mg/dL — ABNORMAL HIGH (ref 8–23)
CO2: 21 mmol/L — ABNORMAL LOW (ref 22–32)
Calcium: 9.8 mg/dL (ref 8.9–10.3)
Chloride: 106 mmol/L (ref 98–111)
Creatinine, Ser: 0.95 mg/dL (ref 0.44–1.00)
GFR calc Af Amer: >60 mL/min (ref >60)
GFR calc non Af Amer: >60 mL/min (ref >60)
Glucose, Bld: 95 mg/dL (ref 70–99)
Potassium: 4.4 mmol/L (ref 3.5–5.1)
Sodium: 135 mmol/L (ref 135–145)

## 2018-08-14 LAB — CBC
HCT: 42.6 % (ref 36.0–46.0)
Hemoglobin: 13.7 g/dL (ref 12.0–15.0)
MCH: 30.6 pg (ref 26.0–34.0)
MCHC: 32.2 g/dL (ref 30.0–36.0)
MCV: 95.3 fL (ref 80.0–100.0)
Platelets: 262 10*3/uL (ref 150–400)
RBC: 4.47 MIL/uL (ref 3.87–5.11)
RDW: 12 % (ref 11.5–15.5)
WBC: 5.2 10*3/uL (ref 4.0–10.5)
nRBC: 0 % (ref 0.0–0.2)

## 2018-08-14 LAB — POCT I-STAT 3, VENOUS BLOOD GAS (G3P V)
Acid-base deficit: 3 mmol/L — ABNORMAL HIGH (ref 0.0–2.0)
Acid-base deficit: 3 mmol/L — ABNORMAL HIGH (ref 0.0–2.0)
Bicarbonate: 22 mmol/L (ref 20.0–28.0)
Bicarbonate: 22.4 mmol/L (ref 20.0–28.0)
O2 Saturation: 69 %
O2 Saturation: 70 %
TCO2: 23 mmol/L (ref 22–32)
TCO2: 24 mmol/L (ref 22–32)
pCO2, Ven: 40 mmHg — ABNORMAL LOW (ref 44.0–60.0)
pCO2, Ven: 40.7 mmHg — ABNORMAL LOW (ref 44.0–60.0)
pH, Ven: 7.347 (ref 7.250–7.430)
pH, Ven: 7.349 (ref 7.250–7.430)
pO2, Ven: 38 mmHg (ref 32.0–45.0)
pO2, Ven: 38 mmHg (ref 32.0–45.0)

## 2018-08-14 LAB — MAGNESIUM: Magnesium: 2.2 mg/dL (ref 1.7–2.4)

## 2018-08-14 LAB — GLUCOSE, CAPILLARY: Glucose-Capillary: 111 mg/dL — ABNORMAL HIGH (ref 70–99)

## 2018-08-14 SURGERY — RIGHT/LEFT HEART CATH AND CORONARY ANGIOGRAPHY
Anesthesia: LOCAL

## 2018-08-14 MED ORDER — VERAPAMIL HCL 2.5 MG/ML IV SOLN
INTRAVENOUS | Status: DC | PRN
  Administered 2018-08-14: 10 mL via INTRA_ARTERIAL

## 2018-08-14 MED ORDER — NICOTINE 14 MG/24HR TD PT24: 14.0000 mg | MEDICATED_PATCH | Freq: Every day | TRANSDERMAL | 0 refills | Status: DC

## 2018-08-14 MED ORDER — HEPARIN (PORCINE) IN NACL 1000-0.9 UT/500ML-% IV SOLN
INTRAVENOUS | Status: DC | PRN
  Administered 2018-08-14 (×2): 500 mL

## 2018-08-14 MED ORDER — CARVEDILOL 6.25 MG PO TABS: 6.2500 mg | ORAL_TABLET | Freq: Two times a day (BID) | ORAL | 0 refills | Status: DC

## 2018-08-14 MED ORDER — ASPIRIN 81 MG PO CHEW: 81.0000 mg | CHEWABLE_TABLET | Freq: Every day | ORAL | Status: DC

## 2018-08-14 MED ORDER — HEPARIN SODIUM (PORCINE) 1000 UNIT/ML IJ SOLN
INTRAMUSCULAR | Status: DC | PRN
  Administered 2018-08-14: 3000 [IU] via INTRAVENOUS

## 2018-08-14 MED ORDER — FOLIC ACID 1 MG PO TABS: 1.0000 mg | ORAL_TABLET | Freq: Every day | ORAL | 0 refills | Status: DC

## 2018-08-14 MED ORDER — ADULT MULTIVITAMIN W/MINERALS CH: 1.0000 | ORAL_TABLET | Freq: Every day | ORAL | 1 refills | Status: DC

## 2018-08-14 MED ORDER — ASPIRIN 81 MG PO CHEW: 81.0000 mg | CHEWABLE_TABLET | Freq: Every day | ORAL | 0 refills | Status: DC

## 2018-08-14 MED ORDER — LOSARTAN POTASSIUM 25 MG PO TABS: 25.0000 mg | ORAL_TABLET | Freq: Every day | ORAL | Status: DC

## 2018-08-14 MED ORDER — SODIUM CHLORIDE 0.9% FLUSH: 3.0000 mL | INTRAVENOUS | Status: DC | PRN

## 2018-08-14 MED ORDER — IOHEXOL 350 MG/ML SOLN
INTRAVENOUS | Status: DC | PRN
  Administered 2018-08-14: 65 mL via INTRAVENOUS

## 2018-08-14 MED ORDER — AMLODIPINE BESYLATE 2.5 MG PO TABS: 2.5000 mg | ORAL_TABLET | Freq: Every day | ORAL | 0 refills | Status: DC

## 2018-08-14 MED ORDER — LIDOCAINE HCL (PF) 1 % IJ SOLN
INTRAMUSCULAR | Status: AC
  Filled 2018-08-14: qty 30

## 2018-08-14 MED ORDER — VERAPAMIL HCL 2.5 MG/ML IV SOLN
INTRAVENOUS | Status: AC
  Filled 2018-08-14: qty 2

## 2018-08-14 MED ORDER — SODIUM CHLORIDE 0.9% FLUSH: 3.0000 mL | Freq: Two times a day (BID) | INTRAVENOUS | Status: DC

## 2018-08-14 MED ORDER — SODIUM CHLORIDE 0.9 % IV SOLN: 250.0000 mL | INTRAVENOUS | Status: DC | PRN

## 2018-08-14 MED ORDER — THIAMINE HCL 100 MG PO TABS: 100.0000 mg | ORAL_TABLET | Freq: Every day | ORAL | 0 refills | Status: DC

## 2018-08-14 MED ORDER — AMLODIPINE BESYLATE 2.5 MG PO TABS: 2.5000 mg | ORAL_TABLET | Freq: Every day | ORAL | Status: DC

## 2018-08-14 MED ORDER — LIDOCAINE HCL (PF) 1 % IJ SOLN
INTRAMUSCULAR | Status: DC | PRN
  Administered 2018-08-14: 2 mL
  Administered 2018-08-14: 5 mL

## 2018-08-14 MED ORDER — SODIUM CHLORIDE 0.9 % IV SOLN: INTRAVENOUS | Status: AC

## 2018-08-14 MED ORDER — CARVEDILOL 6.25 MG PO TABS
6.2500 mg | ORAL_TABLET | Freq: Two times a day (BID) | ORAL | Status: DC
  Administered 2018-08-14: 6.25 mg via ORAL
  Filled 2018-08-14: qty 1

## 2018-08-14 MED ORDER — HEPARIN (PORCINE) IN NACL 1000-0.9 UT/500ML-% IV SOLN
INTRAVENOUS | Status: AC
  Filled 2018-08-14: qty 1000

## 2018-08-14 SURGICAL SUPPLY — 13 items
CATH 5FR JL3.5 JR4 ANG PIG MP (CATHETERS) ×1 IMPLANT
CATH BALLN WEDGE 5F 110CM (CATHETERS) ×1 IMPLANT
DEVICE RAD TR BAND REGULAR (VASCULAR PRODUCTS) ×1 IMPLANT
GLIDESHEATH SLEND A-KIT 6F 22G (SHEATH) ×1 IMPLANT
GUIDEWIRE INQWIRE 1.5J.035X260 (WIRE) IMPLANT
INQWIRE 1.5J .035X260CM (WIRE) ×2
KIT HEART LEFT (KITS) ×2 IMPLANT
PACK CARDIAC CATHETERIZATION (CUSTOM PROCEDURE TRAY) ×2 IMPLANT
SHEATH GLIDE SLENDER 4/5FR (SHEATH) ×1 IMPLANT
SHEATH PROBE COVER 6X72 (BAG) ×1 IMPLANT
TRANSDUCER W/STOPCOCK (MISCELLANEOUS) ×2 IMPLANT
TUBING CIL FLEX 10 FLL-RA (TUBING) ×2 IMPLANT
WIRE EMERALD 3MM-J .025X260CM (WIRE) ×1 IMPLANT

## 2018-08-14 NOTE — Progress Notes (Signed)
DAILY PROGRESS NOTE   Patient Name: Danielle [name] Date of Encounter: 08/14/2018  Chief Complaint   No chest pain  Patient Profile   Danielle [name] is a 67 y.o. female with PMH of HTN, HLD, bipolar disorder/schizophrenia, tardive dyskinesia, asthma, and tobacco abuse, who presented with chest pain x2 months, for which cardiology was asked to evaluate by Dr. Billy Fischer  Subjective   Cath this morning showed mild small vessel branch disease, no PCI targets. Mildly elevated LV filliing pressure - LVEF 40-45% between echo and myoview stress tests.  Objective   Vitals:   08/14/18 0758 08/14/18 0803 08/14/18 0808 08/14/18 0813  BP: 127/81 126/82 126/80 125/82  Pulse: 90 88 (!) 123 90  Resp: 16 14 16 16   Temp:      TempSrc:      SpO2: 99% 99% 99% 100%  Weight:      Height:        Intake/Output Summary (Last 24 hours) at 08/14/2018 1034 Last data filed at 08/14/2018 0547 Gross per 24 hour  Intake 171.44 ml  Output -  Net 171.44 ml   Filed Weights   08/10/18 0953 08/13/18 1451 08/14/18 0510  Weight: 57.6 kg 55.3 kg 56.2 kg    Physical Exam   General appearance: alert and no distress Neck: no carotid bruit, no JVD and thyroid not enlarged, symmetric, no tenderness/mass/nodules Lungs: clear to auscultation bilaterally Heart: regular rate and rhythm Abdomen: soft, non-tender; bowel sounds normal; no masses,  no organomegaly Extremities: right radial TR band in place Pulses: 2+ and symmetric Skin: Skin color, texture, turgor normal. No rashes or lesions Neurologic: Grossly normal Psych: Pleasant  Inpatient Medications    Scheduled Meds: . amLODipine  5 mg Oral Daily  . [START ON 08/15/2018] aspirin  81 mg Oral Daily  . Brexpiprazole  1 mg Oral QHS  . carvedilol  3.125 mg Oral BID WC  . enoxaparin (LOVENOX) injection  40 mg Subcutaneous Q24H  . folic acid  1 mg Oral Daily  . LORazepam  0.5 mg Intravenous Q12H  . magnesium oxide  400 mg Oral Daily  .  mirtazapine  7.5 mg Oral QHS  . multivitamin with minerals  1 tablet Oral Daily  . nicotine  14 mg Transdermal Daily  . potassium chloride  40 mEq Oral Daily  . sodium chloride flush  3 mL Intravenous Q12H  . thiamine  100 mg Oral Daily   Or  . thiamine  100 mg Intravenous Daily  . Valbenazine Tosylate  40 mg Oral BH-q7a    Continuous Infusions: . sodium chloride    . sodium chloride      PRN Meds: sodium chloride, hydrOXYzine, LORazepam **OR** LORazepam, LORazepam **OR** LORazepam, nitroGLYCERIN, oxyCODONE-acetaminophen, sodium chloride flush   Labs   Results for orders placed or performed during the hospital encounter of 08/10/18 (from the past 48 hour(s))  Basic metabolic panel     Status: Abnormal   Collection Time: 08/13/18  4:56 AM  Result Value Ref Range   Sodium 134 (L) 135 - 145 mmol/L   Potassium 4.4 3.5 - 5.1 mmol/L   Chloride 102 98 - 111 mmol/L   CO2 23 22 - 32 mmol/L   Glucose, Bld 102 (H) 70 - 99 mg/dL   BUN 22 8 - 23 mg/dL   Creatinine, Ser 1.05 (H) 0.44 - 1.00 mg/dL   Calcium 10.3 8.9 - 10.3 mg/dL   GFR calc non Af Amer 55 (L) >60 mL/min   GFR calc  Af Amer >60 >60 mL/min    Comment: (NOTE) The eGFR has been calculated using the CKD EPI equation. This calculation has not been validated in all clinical situations. eGFR's persistently <60 mL/min signify possible Chronic Kidney Disease.    Anion gap 9 5 - 15    Comment: Performed at [facility] [address], [place], [place] [address]  Magnesium     Status: None   Collection Time: 08/13/18  4:56 AM  Result Value Ref Range   Magnesium 2.3 1.7 - 2.4 mg/dL    Comment: Performed at [facility] [address] [place], [place] [address]  Basic metabolic panel     Status: Abnormal   Collection Time: 08/14/18  5:25 AM  Result Value Ref Range   Sodium 135 135 - 145 mmol/L   Potassium 4.4 3.5 - 5.1 mmol/L   Chloride 106 98 - 111 mmol/L   CO2 21 (L) 22 - 32 mmol/L   Glucose, Bld 95 70 - 99  mg/dL   BUN 31 (H) 8 - 23 mg/dL   Creatinine, Ser 0.95 0.44 - 1.00 mg/dL   Calcium 9.8 8.9 - 10.3 mg/dL   GFR calc non Af Amer >60 >60 mL/min   GFR calc Af Amer >60 >60 mL/min    Comment: (NOTE) The eGFR has been calculated using the CKD EPI equation. This calculation has not been validated in all clinical situations. eGFR's persistently <60 mL/min signify possible Chronic Kidney Disease.    Anion gap 8 5 - 15    Comment: Performed at [facility] [address] [place], [place] [address]  Magnesium     Status: None   Collection Time: 08/14/18  5:25 AM  Result Value Ref Range   Magnesium 2.2 1.7 - 2.4 mg/dL    Comment: Performed at [facility] [address], [place], [place] [address]  CBC     Status: None   Collection Time: 08/14/18  5:25 AM  Result Value Ref Range   WBC 5.2 4.0 - 10.5 K/uL   RBC 4.47 3.87 - 5.11 MIL/uL   Hemoglobin 13.7 12.0 - 15.0 g/dL   HCT 42.6 36.0 - 46.0 %   MCV 95.3 80.0 - 100.0 fL   MCH 30.6 26.0 - 34.0 pg   MCHC 32.2 30.0 - 36.0 g/dL   RDW 12.0 11.5 - 15.5 %   Platelets 262 150 - 400 K/uL   nRBC 0.0 0.0 - 0.2 %    Comment: Performed at [facility], [place] [address] [place], [place] [address]    ECG   N/A  Telemetry   Sinus rhythm - Personally Reviewed  Radiology    No results found.  Cardiac Studies   RIGHT/LEFT HEART CATH AND CORONARY ANGIOGRAPHY  Conclusion     Ost 1st Diag lesion is 85% stenosed.  Otherwise angiographic normal coronary arteries  Essentially normal right heart cath pressures with no significant V wave on PCWP waveform.  There is mild left ventricular systolic dysfunction.  LV end diastolic pressure is mildly elevated.  The left ventricular ejection fraction is 45-50% by visual estimate.  There is moderate (3+) mitral regurgitation -but likely exaggerated because of post PVC beat.   SUMMARY:  Angiographically single-vessel disease involving a very small caliber first diagonal  branch with ostial disease.  Otherwise no significant CAD and a relatively codominant system.  Angiographically moderate mitral regurgitation (albeit after PVC) -significant MR not corroborated by right  heart cath numbers.  Relatively normal right heart cath numbers with mildly elevated EDP and normal wedge pressure.  Mildly reduced LVEF with global hypokinesis.  RECOMMENDATION:   Medical management for small caliber diagonal stenosis.  Not PCI target  Would like to consider titrating carvedilol and amlodipine doses.  Recommend Aspirin 57m daily for moderate CAD.   Defer timing of discharge to primary service.  Would be okay to discharge from a cardiac catheterization standpoint after bedrest.    DGlenetta Hew MD    Assessment   1. Principal Problem: 2.   Chest pain with high risk for cardiac etiology 3. Active Problems: 4.   HYPERTENSION, BENIGN ESSENTIAL 5.   Noncompliance 6.   Anxiety 7.   Schizophrenia, paranoid type (HFort [place] 8.   Hypokalemia 9.   Tobacco abuse 10.   Chest pain 11.   Unstable angina ([place] 12.   Abnormal nuclear stress test 13.   Plan   1. No significant CAD on cath today that was amenable to PCI. LVEF moderately reduced with relatively normal RHC numbers. Medical therapy is recommended. Will further increase coreg to 6.25 mg BID and decrease amlodipine to 2.5 mg daily. Cannot use ACE-I/ARB/ARNI d/t history of angioedema with fosinipril - BP at goal. ASA 81 mg daily. Will not start daily lasix at this point. Ok for d/c later today after bedrest for TR band removal.  [facility]  Time Spent Directly with Patient:  I have spent a total of 15 minutes with the patient reviewing hospital notes, telemetry, EKGs, labs and examining the patient as well as establishing an assessment and plan that was discussed personally with the patient.  > 50% of time was spent in direct patient care.  Length of Stay:  LOS: 1 day    KPixie Casino MD, FUniversity Pointe Surgical Hospital FCross TimberDirector of the Advanced Lipid Disorders &  Cardiovascular Risk Reduction Clinic Diplomate of the American Board of Clinical Lipidology Attending Cardiologist  Direct Dial: [phone] Fax: [phone] Website:  [email or web address] 08/14/2018, 10:34 AM

## 2018-08-14 NOTE — Discharge Summary (Signed)
Physician Discharge Summary  Danielle [name] ZOX:[id number] DOB: 1951/01/19 DOA: 08/10/2018  [facility]: [facility]  Admit date: 08/10/2018 Discharge date: 08/14/2018  Admitted From: Home.  Disposition:  Home.   Recommendations for Outpatient Follow-up:  1. Follow up with PCP in 1-2 weeks 2. Please obtain BMP/CBC in one week    Discharge Condition:stable.  CODE STATUS: full code.  Diet recommendation: Heart Healthy  Brief/Interim Summary: Danielle Johnsonis a 67 y.o.femalewith PMH of HTN,HLD,bipolar disorder/schizophrenia,tardive dyskinesia,asthma, and tobacco abuse,who presented withchest pain x2 months, for which cardiology was asked to evaluate by Dr. Dalene Seltzer  Discharge Diagnoses:  Principal Problem:   Chest pain with high risk for cardiac etiology Active Problems:   HYPERTENSION, BENIGN ESSENTIAL   Noncompliance   Anxiety   Schizophrenia, paranoid type (HCC)   Hypokalemia   Tobacco abuse   Chest pain   Unstable angina (HCC)   Abnormal nuclear stress test   Atypical chest pain: Differential include anxiety vs gastritis from ETOH use.  Serial troponins, and stress done.  It was followed by cardiac cath showing small vessel disease. Recommended medical management.  Echo: showed LVEF of 40% to 45%. Continue with aspirin 81 mg daily.    ETOH abuse: No signs of withdrawal.    Hypertension:  Better controlled.  Resume home meds.    Hyperlipidemia:  Will need statin on discharge. Pt reports will to talk PCP before starting statin.    Bipolar disorder;  Resume home meds.   Discharge Instructions   Allergies as of 08/14/2018      Reactions   Clonazepam Swelling   Pt was recently given a triamterene hydrochlorothiazide combination as well as clonazepam and had an allergic reaction to one of them. Caused swelling of face and eyes   Doxycycline Swelling   Swelling of face and eyes   Fosinopril Sodium Swelling    swelling  face,lips-angioedema from ACE I   Hydrochlorothiazide W-triamterene Swelling   Pt was recently given a triamterene hydrochlorothiazide combination as well as clonazepam and had an allergic reaction to one of them. Swelling of face and eyes   Penicillins Swelling   Face and eyes Has patient had a PCN reaction causing immediate rash, facial/tongue/throat swelling, SOB or lightheadedness with hypotension: Yes Has patient had a PCN reaction causing severe rash involving mucus membranes or skin necrosis: No Has patient had a PCN reaction that required hospitalization: No Has patient had a PCN reaction occurring within the last 10 years: No If all of the above answers are "NO", then may proceed with Cephalosporin use.   Sulfa Antibiotics Other (See Comments)   Unknown reaction      Medication List    TAKE these medications   amLODipine 2.5 MG tablet Commonly known as:  NORVASC Take 1 tablet (2.5 mg total) by mouth daily. Start taking on:  08/15/2018   aspirin 81 MG chewable tablet Chew 1 tablet (81 mg total) by mouth daily. Start taking on:  08/15/2018   carvedilol 6.25 MG tablet Commonly known as:  COREG Take 1 tablet (6.25 mg total) by mouth 2 (two) times daily with a meal.   folic acid 1 MG tablet Commonly known as:  FOLVITE Take 1 tablet (1 mg total) by mouth daily. Start taking on:  08/15/2018   hydrOXYzine 10 MG tablet Commonly known as:  ATARAX/VISTARIL Take 10 mg by mouth 2 (two) times daily as needed for anxiety.   INGREZZA 40 MG Caps Generic drug:  Valbenazine Tosylate Take 40 mg by mouth every  morning.   mirtazapine 15 MG tablet Commonly known as:  REMERON Take 7.5 mg by mouth at bedtime.   multivitamin with minerals Tabs tablet Take 1 tablet by mouth daily. Start taking on:  08/15/2018   nicotine 14 mg/24hr patch Commonly known as:  NICODERM CQ - dosed in mg/24 hours Place 1 patch (14 mg total) onto the skin daily. Start taking on:  08/15/2018   REXULTI  1 MG Tabs Generic drug:  Brexpiprazole Take 1 mg by mouth at bedtime.   thiamine 100 MG tablet Take 1 tablet (100 mg total) by mouth daily. Start taking on:  08/15/2018      Follow-up Information    Wendall Stade, MD Follow up on 09/05/2018.   Specialty:  Cardiology Why:  at 2:30 pm with his NP, Norma Fredrickson Contact information: [address] [place] [address] [phone]        [facility]. Schedule an appointment as soon as possible for a visit in 1 week(s).   Contact information: [address] [place] [address] [phone]          Allergies  Allergen Reactions  . Clonazepam Swelling    Pt was recently given a triamterene hydrochlorothiazide combination as well as clonazepam and had an allergic reaction to one of them. Caused swelling of face and eyes  . Doxycycline Swelling    Swelling of face and eyes  . Fosinopril Sodium Swelling     swelling face,lips-angioedema from ACE I  . Hydrochlorothiazide W-Triamterene Swelling    Pt was recently given a triamterene hydrochlorothiazide combination as well as clonazepam and had an allergic reaction to one of them. Swelling of face and eyes  . Penicillins Swelling    Face and eyes Has patient had a PCN reaction causing immediate rash, facial/tongue/throat swelling, SOB or lightheadedness with hypotension: Yes Has patient had a PCN reaction causing severe rash involving mucus membranes or skin necrosis: No Has patient had a PCN reaction that required hospitalization: No Has patient had a PCN reaction occurring within the last 10 years: No If all of the above answers are "NO", then may proceed with Cephalosporin use.   . Sulfa Antibiotics Other (See Comments)    Unknown reaction    Consultations:  Cardiology Dr Rennis Golden.    Procedures/Studies: Dg Chest 2 View  Result Date: 08/10/2018 CLINICAL DATA:  Dull, intermittent chest pain and shortness of breath for 2 months.  EXAM: CHEST - 2 VIEW COMPARISON:  PA and lateral chest 04/04/2017 and 05/23/2014. FINDINGS: The lungs are clear. There is cardiomegaly. Aortic atherosclerosis is noted. No pneumothorax or pleural fluid. No acute or focal bony abnormality. IMPRESSION: No acute disease. Cardiomegaly. Atherosclerosis. Electronically Signed   By: Drusilla Kanner M.D.   On: 08/10/2018 10:51   Nm Myocar Multi W/spect W/wall Motion / Ef  Result Date: 08/11/2018 CLINICAL DATA:  Chest pain. Stroke. Tobacco use. Hypertension. Family history of heart disease. Coronary artery disease. Asthma. EXAM: MYOCARDIAL IMAGING WITH SPECT (REST AND PHARMACOLOGIC-STRESS) GATED LEFT VENTRICULAR WALL MOTION STUDY LEFT VENTRICULAR EJECTION FRACTION TECHNIQUE: Standard myocardial SPECT imaging was performed after resting intravenous injection of 10 mCi Tc-76m tetrofosmin. Subsequently, intravenous infusion of Lexiscan was performed under the supervision of the Cardiology staff. At peak effect of the drug, 30 mCi Tc-5m tetrofosmin was injected intravenously and standard myocardial SPECT imaging was performed. Quantitative gated imaging was also performed to evaluate left ventricular wall motion, and estimate left ventricular ejection fraction. COMPARISON:  Chest  radiograph 08/10/2018 FINDINGS: Perfusion: Matched defects in the cardiac apex and along portions of the lateral wall. Borderline appearance for a small region of inducible ischemia along the apicolateral segment. Reverse redistribution along the inferior wall and inferolateral wall. Small region of mild suspected inducible ischemia along the anterior wall in the mid heart. Wall Motion: Moderate hypokinesis and poor wall thickening in the inferior and lateral walls. Left Ventricular Ejection Fraction: 40 % End diastolic volume 119 ml (moderately elevated) End systolic volume 71 ml (prominently elevated) IMPRESSION: 1. Mild regions of inducible ischemia along the anterior wall, and along the  apicolateral segment of the left ventricle. Matched defects in the cardiac apex and lateral wall. Reverse redistribution appearance in the inferior and inferolateral walls. End diastolic volume is moderately increased and in systolic volume is considerably increased. 2. Moderate hypokinesis and poor wall thickening in the inferior and lateral walls. 3. Left ventricular ejection fraction 40% 4. Non invasive risk stratification*: High *2012 Appropriate Use Criteria for Coronary Revascularization Focused Update: J Am Coll Cardiol. 2012;59(9):857-881. http://content.dementiazones.com.aspx?articleid=[email or web address] Electronically Signed   By: Gaylyn Rong M.D.   On: 08/11/2018 14:20       Subjective:  No chest pain or sob.   Discharge Exam: Vitals:   08/14/18 0813 08/14/18 1156  BP: 125/82 113/66  Pulse: 90 93  Resp: 16 18  Temp:    SpO2: 100% 99%   Vitals:   08/14/18 0803 08/14/18 0808 08/14/18 0813 08/14/18 1156  BP: 126/82 126/80 125/82 113/66  Pulse: 88 (!) 123 90 93  Resp: 14 16 16 18   Temp:      TempSrc:      SpO2: 99% 99% 100% 99%  Weight:      Height:        General: Pt is alert, awake, not in acute distress Cardiovascular: RRR, S1/S2 +, no rubs, no gallops Respiratory: CTA bilaterally, no wheezing, no rhonchi Abdominal: Soft, NT, ND, bowel sounds + Extremities: no edema, no cyanosis    The results of significant diagnostics from this hospitalization (including imaging, microbiology, ancillary and laboratory) are listed below for reference.     Microbiology: No results found for this or any previous visit (from the past 240 hour(s)).   Labs: BNP (last 3 results) Recent Labs    08/12/18 0706  BNP 115.5*   Basic Metabolic Panel: Recent Labs  Lab 08/10/18 0959 08/10/18 2228 08/11/18 0001 08/12/18 0706 08/13/18 0456 08/14/18 0525  NA 142  --  140 136 134* 135  K 2.8*  --  3.6 3.9 4.4 4.4  CL 108  --  107 107 102 106  CO2 26  --  24 21* 23 21*   GLUCOSE 93  --  102* 107* 102* 95  BUN 10  --  12 21 22  31*  CREATININE 0.93  --  1.13* 1.03* 1.05* 0.95  CALCIUM 9.9  --  9.5 9.8 10.3 9.8  MG  --  1.8  --  2.1 2.3 2.2   Liver Function Tests: Recent Labs  Lab 08/11/18 0001  AST 19  ALT 11  ALKPHOS 71  BILITOT 0.5  PROT 6.1*  ALBUMIN 3.3*   No results for input(s): LIPASE, AMYLASE in the last 168 hours. No results for input(s): AMMONIA in the last 168 hours. CBC: Recent Labs  Lab 08/10/18 0959 08/11/18 0001 08/12/18 0706 08/14/18 0525  WBC 5.4 6.0 5.8 5.2  HGB 14.5 13.2 14.1 13.7  HCT 44.9 39.2 41.7 42.6  MCV 95.3 94.0 94.1 95.3  PLT 235 215 214 262   Cardiac Enzymes: Recent Labs  Lab 08/10/18 1621 08/10/18 2228  TROPONINI 0.04* 0.04*   BNP: Invalid input(s): POCBNP CBG: No results for input(s): GLUCAP in the last 168 hours. D-Dimer No results for input(s): DDIMER in the last 72 hours. Hgb A1c No results for input(s): HGBA1C in the last 72 hours. Lipid Profile No results for input(s): CHOL, HDL, LDLCALC, TRIG, CHOLHDL, LDLDIRECT in the last 72 hours. Thyroid function studies No results for input(s): TSH, T4TOTAL, T3FREE, THYROIDAB in the last 72 hours.  Invalid input(s): FREET3 Anemia work up No results for input(s): VITAMINB12, FOLATE, FERRITIN, TIBC, IRON, RETICCTPCT in the last 72 hours. Urinalysis    Component Value Date/Time   COLORURINE YELLOW 05/23/2014 1540   APPEARANCEUR CLEAR 05/23/2014 1540   LABSPEC 1.010 03/23/2018 1531   PHURINE 6.5 03/23/2018 1531   GLUCOSEU NEGATIVE 03/23/2018 1531   HGBUR NEGATIVE 03/23/2018 1531   HGBUR negative 10/25/2008 0936   BILIRUBINUR NEGATIVE 03/23/2018 1531   KETONESUR NEGATIVE 03/23/2018 1531   PROTEINUR NEGATIVE 03/23/2018 1531   UROBILINOGEN 0.2 03/23/2018 1531   NITRITE NEGATIVE 03/23/2018 1531   LEUKOCYTESUR TRACE (A) 03/23/2018 1531   Sepsis Labs Invalid input(s): PROCALCITONIN,  WBC,  LACTICIDVEN Microbiology No results found for this or  any previous visit (from the past 240 hour(s)).   Time coordinating discharge: 36 minutes  SIGNED:   Kathlen Mody, MD  [facility] 08/14/2018, 1:09 PM Pager   If 7PM-7AM, please contact night-coverage [email or web address] Password [email or web address]

## 2018-08-14 NOTE — Discharge Instructions (Signed)

## 2018-08-14 NOTE — Progress Notes (Signed)
[employer] called and updated with d/c orders and clarified if [place], pt's boyfriend , is picking her up. Ms Danielle [name] stated yes ,Danielle [name] is going to pick ms Danielle [name] up.

## 2018-08-14 NOTE — Interval H&P Note (Signed)
History and Physical Interval Note:  08/14/2018 7:21 AM  Danielle [name]  has presented today for surgery, with the diagnosis of abnormal stress test. Patient admitted with chest pain. Abnormal nuclear stress, echo with low normal to mildly decreased LV function with WMAs. Plans for cath on Monday. With mild LV dysfunction and PVCs, start coreg 3.125mg  bid. Could consider ACE-I after cath, may require statin pending findings. Mildly elevated BNP, would not change IVFs planned prior to procedure.   No additonal recs over the weekend, call with questions. Plan for cath Monday. If recurrent pain over the weekend would start hep gtt.   Dominga Ferry MD    The various methods of treatment have been discussed with the patient and family. After consideration of risks, benefits and other options for treatment, the patient has consented to  Procedure(s): RIGHT/LEFT HEART CATH AND CORONARY ANGIOGRAPHY (N/A) with possible PERCUTANEOUS CORONARY INTERVENTION as a surgical intervention .    The patient's history has been reviewed, patient examined, no change in status, stable for surgery.  I have reviewed the patient's chart and labs.  Questions were answered to the patient's satisfaction.    Cath Lab Visit (complete for each Cath Lab visit)  Clinical Evaluation Leading to the Procedure:   ACS: No.  Non-ACS:    Anginal Classification: CCS III  Anti-ischemic medical therapy: Maximal Therapy (2 or more classes of medications)  Non-Invasive Test Results: High-risk stress test findings: cardiac mortality >3%/year  Prior CABG: No previous CABG  Bryan Lemma

## 2018-08-14 NOTE — Evaluation (Signed)
Physical Therapy Evaluation Patient Details Name: Danielle [name] MRN: [id number] DOB: 09-27-1951 Today's Date: 08/14/2018   History of Present Illness  67yo female admitted due to c/o chest pain with activity and ambulation. PMH bipolar disorder, depression, fall, HTn, memory disturbance, CVA, tardive dyskinesia, cardiac cath   Clinical Impression   Noted cardiac cath performed this morning as well as imminent DC order and spoke to RN; RN reports that patient is OK to walk/participate in PT as long as she keeps her R UE to her chest due to this being the site for her cardiac cath this morning. Patient very pleasant and agreeable to participate in PT. Able to perform all functional bed mobility, transfers, and gait approximately 361f with no device all with distant S and VC to maintain correct positioning of R UE to chest. She reports that she is generally at her baseline level of function. She was left in bed with all needs met and questions/concerns addressed this afternoon. She appears to generally be at her baseline level of function and is not in need of skilled PT services in the acute setting or following discharge. Thank you for the referral- PT signing off.     Follow Up Recommendations No PT follow up    Equipment Recommendations  None recommended by PT    Recommendations for Other Services       Precautions / Restrictions Precautions Precautions: Fall Restrictions Weight Bearing Restrictions: No      Mobility  Bed Mobility Overal bed mobility: Needs Assistance Bed Mobility: Supine to Sit;Sit to Supine     Supine to sit: Supervision Sit to supine: Supervision   General bed mobility comments: S for cues to keep R UE to chest, not to push down through R UE   Transfers Overall transfer level: Needs assistance   Transfers: Sit to/from Stand Sit to Stand: Supervision         General transfer comment: S for cues to keep R UE close to chest, not to use R UE    Ambulation/Gait Ambulation/Gait assistance: Supervision Gait Distance (Feet): 300 Feet Assistive device: None Gait Pattern/deviations: WFL(Within Functional Limits)     General Gait Details: gait pattern WNL, S for safety and VC to keep R UE close to chest   Stairs            Wheelchair Mobility    Modified Rankin (Stroke Patients Only)       Balance Overall balance assessment: No apparent balance deficits (not formally assessed)                                           Pertinent Vitals/Pain Pain Assessment: No/denies pain    Home Living Family/patient expects to be discharged to:: Private residence Living Arrangements: Other (Comment)(brother ) Available Help at Discharge: Family Type of Home: House Home Access: Level entry     Home Layout: One level Home Equipment: None      Prior Function Level of Independence: Independent               Hand Dominance        Extremity/Trunk Assessment   Upper Extremity Assessment Upper Extremity Assessment: Overall WFL for tasks assessed    Lower Extremity Assessment Lower Extremity Assessment: Overall WFL for tasks assessed    Cervical / Trunk Assessment Cervical / Trunk Assessment: Normal  Communication   Communication: No  difficulties;Other (comment)(tends to mumble )  Cognition Arousal/Alertness: Awake/alert Behavior During Therapy: WFL for tasks assessed/performed Overall Cognitive Status: Within Functional Limits for tasks assessed                                        General Comments      Exercises     Assessment/Plan    PT Assessment Patent does not need any further PT services  PT Problem List Decreased activity tolerance;Decreased safety awareness;Decreased coordination       PT Treatment Interventions Gait training    PT Goals (Current goals can be found in the Care Plan section)  Acute Rehab PT Goals Patient Stated Goal: to go home  PT  Goal Formulation: With patient Time For Goal Achievement: 08/28/18 Potential to Achieve Goals: Good    Frequency Other (Comment)(eval only )   Barriers to discharge        Co-evaluation               AM-PAC PT "6 Clicks" Daily Activity  Outcome Measure Difficulty turning over in bed (including adjusting bedclothes, sheets and blankets)?: None Difficulty moving from lying on back to sitting on the side of the bed? : None Difficulty sitting down on and standing up from a chair with arms (e.g., wheelchair, bedside commode, etc,.)?: None Help needed moving to and from a bed to chair (including a wheelchair)?: None Help needed walking in hospital room?: None Help needed climbing 3-5 steps with a railing? : A Little 6 Click Score: 23    End of Session   Activity Tolerance: Patient tolerated treatment well Patient left: in bed;with call bell/phone within reach   PT Visit Diagnosis: History of falling (Z91.81)    Time: 7340-3709 PT Time Calculation (min) (ACUTE ONLY): 11 min   Charges:   PT Evaluation $PT Eval Low Complexity: 1 Low          Deniece Ree PT, DPT, CBIS  Supplemental Physical Therapist [place]    Pager [phone] Acute Rehab Office [phone]

## 2018-08-21 DIAGNOSIS — I10 Essential (primary) hypertension: Secondary | ICD-10-CM | POA: Diagnosis not present

## 2018-08-21 DIAGNOSIS — D539 Nutritional anemia, unspecified: Secondary | ICD-10-CM | POA: Diagnosis not present

## 2018-08-21 DIAGNOSIS — Z79899 Other long term (current) drug therapy: Secondary | ICD-10-CM | POA: Diagnosis not present

## 2018-08-21 DIAGNOSIS — M542 Cervicalgia: Secondary | ICD-10-CM | POA: Diagnosis not present

## 2018-08-25 DIAGNOSIS — Z87891 Personal history of nicotine dependence: Secondary | ICD-10-CM | POA: Diagnosis not present

## 2018-08-25 DIAGNOSIS — R942 Abnormal results of pulmonary function studies: Secondary | ICD-10-CM | POA: Diagnosis not present

## 2018-08-25 DIAGNOSIS — Z6824 Body mass index (BMI) 24.0-24.9, adult: Secondary | ICD-10-CM | POA: Diagnosis not present

## 2018-08-25 DIAGNOSIS — F17200 Nicotine dependence, unspecified, uncomplicated: Secondary | ICD-10-CM | POA: Diagnosis not present

## 2018-08-25 DIAGNOSIS — R0602 Shortness of breath: Secondary | ICD-10-CM | POA: Diagnosis not present

## 2018-08-31 DIAGNOSIS — Z1211 Encounter for screening for malignant neoplasm of colon: Secondary | ICD-10-CM | POA: Diagnosis not present

## 2018-09-01 DIAGNOSIS — I10 Essential (primary) hypertension: Secondary | ICD-10-CM | POA: Diagnosis not present

## 2018-09-01 DIAGNOSIS — E7800 Pure hypercholesterolemia, unspecified: Secondary | ICD-10-CM | POA: Diagnosis not present

## 2018-09-05 ENCOUNTER — Ambulatory Visit: Payer: Self-pay | Admitting: Nurse Practitioner

## 2018-09-05 NOTE — Progress Notes (Deleted)
CARDIOLOGY OFFICE NOTE  Date:  09/05/2018    Sherran NeedsGwendolyn Hinger Date of Birth: 10/22/1950 Medical Record #[id number]  [facility]  Cardiologist:  Tyrone SageGerhardt & ***    No chief complaint on file.   History of Present Illness: Sherran NeedsGwendolyn Speich is a 67 y.o. female who presents today for a *** PMH of HTN,HLD,bipolar disorder/schizophrenia,tardive dyskinesia,asthma, and tobacco abuse,who presented withchest pain x2 months, for which cardiology was asked to evaluate by Dr. Nicholas LoseSchlossman  Comes in today. Here with   Past Medical History:  Diagnosis Date  . Asthma   . Bipolar affective disorder, currently depressed, mild (HCC)   . Chest pain   . Complication of anesthesia    " My eyes swell up "  . Coronary artery disease   . Depression   . Fall 08/25/2012   Recent fall with residual knee and back pain.  . Family history of anesthesia complication    " [name] "  . Hypertension   . Memory disturbance 01/30/2016  . Stroke (HCC)   . Tardive dyskinesia   . Tobacco abuse     Past Surgical History:  Procedure Laterality Date  . ABDOMINAL HYSTERECTOMY    . CARDIAC CATHETERIZATION    . RIGHT/LEFT HEART CATH AND CORONARY ANGIOGRAPHY N/A 08/14/2018   Procedure: RIGHT/LEFT HEART CATH AND CORONARY ANGIOGRAPHY;  Surgeon: Marykay LexHarding, David W, MD;  [facility];  Service: Cardiovascular;  Laterality: N/A;     Medications: No outpatient medications have been marked as taking for the 09/05/18 encounter (Appointment) with Rosalio MacadamiaGerhardt, Anuel Sitter C, NP.     Allergies: Allergies  Allergen Reactions  . Clonazepam Swelling    Pt was recently given a triamterene hydrochlorothiazide combination as well as clonazepam and had an allergic reaction to one of them. Caused swelling of face and eyes  . Doxycycline Swelling    Swelling of face and eyes  . Fosinopril Sodium Swelling     swelling face,lips-angioedema from ACE I  . Hydrochlorothiazide  W-Triamterene Swelling    Pt was recently given a triamterene hydrochlorothiazide combination as well as clonazepam and had an allergic reaction to one of them. Swelling of face and eyes  . Penicillins Swelling    Face and eyes Has patient had a PCN reaction causing immediate rash, facial/tongue/throat swelling, SOB or lightheadedness with hypotension: Yes Has patient had a PCN reaction causing severe rash involving mucus membranes or skin necrosis: No Has patient had a PCN reaction that required hospitalization: No Has patient had a PCN reaction occurring within the last 10 years: No If all of the above answers are "NO", then may proceed with Cephalosporin use.   . Sulfa Antibiotics Other (See Comments)    Unknown reaction    Social History: The patient  reports that she has been smoking cigarettes. She has been smoking about 1.00 pack per day. She has never used smokeless tobacco. She reports that she drinks alcohol. She reports that she does not use drugs.   Family History: The patient's ***family history includes Heart attack in her brother, father, mother, and sister; Hypertension in her mother; Stroke in her mother.   Review of Systems: Please see the history of present illness.   Otherwise, the review of systems is positive for {NONE DEFAULTED:18576::"none"}.   All other systems are reviewed and negative.   Physical Exam: VS:  There were no vitals taken for this visit. Marland Kitchen.  BMI There is no height or weight on file  to calculate BMI.  Wt Readings from Last 3 Encounters:  08/14/18 123 lb 12.8 oz (56.2 kg)  07/14/17 140 lb (63.5 kg)  04/04/17 115 lb (52.2 kg)    General: Pleasant. Well developed, well nourished and in no acute distress.   HEENT: Normal.  Neck: Supple, no JVD, carotid bruits, or masses noted.  Cardiac: ***Regular rate and rhythm. No murmurs, rubs, or gallops. No edema.  Respiratory:  Lungs are clear to auscultation bilaterally with normal work of breathing.  GI:  Soft and nontender.  MS: No deformity or atrophy. Gait and ROM intact.  Skin: Warm and dry. Color is normal.  Neuro:  Strength and sensation are intact and no gross focal deficits noted.  Psych: Alert, appropriate and with normal affect.   LABORATORY DATA:  EKG:  EKG {ACTION; IS/IS ZOX:[id number]} ordered today. This demonstrates ***.  Lab Results  Component Value Date   WBC 5.2 08/14/2018   HGB 13.7 08/14/2018   HCT 42.6 08/14/2018   PLT 262 08/14/2018   GLUCOSE 95 08/14/2018   CHOL 233 (H) 08/11/2018   TRIG 132 08/11/2018   HDL 78 08/11/2018   LDLCALC 129 (H) 08/11/2018   ALT 11 08/11/2018   AST 19 08/11/2018   NA 135 08/14/2018   K 4.4 08/14/2018   CL 106 08/14/2018   CREATININE 0.95 08/14/2018   BUN 31 (H) 08/14/2018   CO2 21 (L) 08/14/2018   TSH 2.188 04/28/2012   INR 1.08 05/23/2014   HGBA1C 5.1 08/10/2018     BNP (last 3 results) Recent Labs    08/12/18 0706  BNP 115.5*    ProBNP (last 3 results) No results for input(s): PROBNP in the last 8760 hours.   Other Studies Reviewed Today: RIGHT/LEFT HEART CATH AND CORONARY ANGIOGRAPHY 07/2018  Conclusion     Ost 1st Diag lesion is 85% stenosed.  Otherwise angiographic normal coronary arteries  Essentially normal right heart cath pressures with no significant V wave on PCWP waveform.  There is mild left ventricular systolic dysfunction.  LV end diastolic pressure is mildly elevated.  The left ventricular ejection fraction is 45-50% by visual estimate.  There is moderate (3+) mitral regurgitation -but likely exaggerated because of post PVC beat.   SUMMARY:  Angiographically single-vessel disease involving a very small caliber first diagonal branch with ostial disease.  Otherwise no significant CAD and a relatively codominant system.  Angiographically moderate mitral regurgitation (albeit after PVC) -significant MR not corroborated by right heart cath numbers.  Relatively normal right heart cath  numbers with mildly elevated EDP and normal wedge pressure.  Mildly reduced LVEF with global hypokinesis.  RECOMMENDATION:   Medical management for small caliber diagonal stenosis.  Not PCI target  Would like to consider titrating carvedilol and amlodipine doses.  Recommend Aspirin 81mg  daily for moderate CAD.   Defer timing of discharge to primary service.  Would be okay to discharge from a cardiac catheterization standpoint after bedrest.   Echo Study Conclusions 07/2018  - Left ventricle: Distal septal apical and basal inferior wall   hypokinesis The cavity size was mildly dilated. Wall thickness   was normal. Systolic function was mildly reduced. The estimated   ejection fraction was in the range of 45% to 50%. Left   ventricular diastolic function parameters were normal. - Aortic valve: There was trivial regurgitation. Valve area (VTI):   2.13 cm^2. Valve area (Vmax): 2.08 cm^2. Valve area (Vmean): 1.67   cm^2. - Mitral valve: Eccentric MR with restricted posterior leaflet  motion There was moderate regurgitation. - Left atrium: The atrium was mildly dilated. - Atrial septum: No defect or patent foramen ovale was identified. - Impressions: Some turbulent flow seen in distal PA on basal SA   views  Impressions:  - Some turbulent flow seen in distal PA on basal SA views  Assessment/Plan: Atypical chest pain: Differential include anxiety vs gastritis from ETOH use.  Serial troponins, and stress done.  It was followed by cardiac cath showing small vessel disease. Recommended medical management.  Echo: showed LVEF of 40% to 45%. Continue with aspirin 81 mg daily.    ETOH abuse: No signs of withdrawal.    Hypertension:  Better controlled.  Resume home meds.    Hyperlipidemia:  Will need statin on discharge. Pt reports will to talk PCP before starting statin.    Bipolar disorder;  Resume home meds.   1. Chest pain with minimally elevated  troponin - atypical symptoms per MD, plan Lexiscan nuclear stress test today. Utox neg.  2. Frequent PVCs/couplets/triplets - goal K 4.0+, Mg 2.0+. Start KCl daily and MagOx 400mg  daily. Will obtain echocardiogram for completeness as well. Consider addition of beta blocker. May need OP Holter for quantification.  3. HTN - improved with addition of amlodipine, follow.  4. Hyperlipidemia - consider statin initiation based on results of test.  5. Hypokalemia - see above.  6. Tobacco abuse/habitual ETOH intake - cessation advised for tobacco and recommended to cut down ETOH.  7. Bipolar d/o - per primary team.     Current medicines are reviewed with the patient today.  The patient does not have concerns regarding medicines other than what has been noted above.  The following changes have been made:  See above.  Labs/ tests ordered today include:   No orders of the defined types were placed in this encounter.    Disposition:   FU with *** in {gen number 1-61:096045} {Days to years:10300}.   Patient is agreeable to this plan and will call if any problems develop in the interim.   SignedNorma Fredrickson, NP  09/05/2018 1:43 PM  [facility] [address] [place], [place]  [address] Phone: [phone] Fax: [phone]

## 2018-09-06 ENCOUNTER — Encounter: Payer: Self-pay | Admitting: Nurse Practitioner

## 2018-09-07 DIAGNOSIS — I340 Nonrheumatic mitral (valve) insufficiency: Secondary | ICD-10-CM | POA: Diagnosis not present

## 2018-09-07 DIAGNOSIS — R9431 Abnormal electrocardiogram [ECG] [EKG]: Secondary | ICD-10-CM | POA: Diagnosis not present

## 2018-09-21 DIAGNOSIS — Z79899 Other long term (current) drug therapy: Secondary | ICD-10-CM | POA: Diagnosis not present

## 2018-09-21 DIAGNOSIS — F411 Generalized anxiety disorder: Secondary | ICD-10-CM | POA: Diagnosis not present

## 2018-09-21 DIAGNOSIS — F322 Major depressive disorder, single episode, severe without psychotic features: Secondary | ICD-10-CM | POA: Diagnosis not present

## 2018-09-21 DIAGNOSIS — G4700 Insomnia, unspecified: Secondary | ICD-10-CM | POA: Diagnosis not present

## 2018-12-25 DIAGNOSIS — R0981 Nasal congestion: Secondary | ICD-10-CM | POA: Diagnosis not present

## 2018-12-25 DIAGNOSIS — J209 Acute bronchitis, unspecified: Secondary | ICD-10-CM | POA: Diagnosis not present

## 2018-12-31 ENCOUNTER — Emergency Department (HOSPITAL_COMMUNITY)
Admission: EM | Admit: 2018-12-31 | Discharge: 2018-12-31 | Disposition: A | Discharge status: Home / Self Care | Payer: Medicare Other | Attending: Emergency Medicine | Admitting: Emergency Medicine

## 2018-12-31 ENCOUNTER — Other Ambulatory Visit: Payer: Self-pay

## 2018-12-31 ENCOUNTER — Emergency Department (HOSPITAL_COMMUNITY): Payer: Medicare Other

## 2018-12-31 ENCOUNTER — Encounter (HOSPITAL_COMMUNITY): Payer: Self-pay

## 2018-12-31 DIAGNOSIS — Z79899 Other long term (current) drug therapy: Secondary | ICD-10-CM | POA: Insufficient documentation

## 2018-12-31 DIAGNOSIS — R0981 Nasal congestion: Secondary | ICD-10-CM | POA: Diagnosis present

## 2018-12-31 DIAGNOSIS — R079 Chest pain, unspecified: Secondary | ICD-10-CM | POA: Diagnosis not present

## 2018-12-31 DIAGNOSIS — J4520 Mild intermittent asthma, uncomplicated: Secondary | ICD-10-CM | POA: Diagnosis not present

## 2018-12-31 DIAGNOSIS — Z7982 Long term (current) use of aspirin: Secondary | ICD-10-CM | POA: Insufficient documentation

## 2018-12-31 DIAGNOSIS — I2510 Atherosclerotic heart disease of native coronary artery without angina pectoris: Secondary | ICD-10-CM | POA: Diagnosis not present

## 2018-12-31 DIAGNOSIS — J45909 Unspecified asthma, uncomplicated: Secondary | ICD-10-CM | POA: Diagnosis not present

## 2018-12-31 DIAGNOSIS — F17210 Nicotine dependence, cigarettes, uncomplicated: Secondary | ICD-10-CM | POA: Insufficient documentation

## 2018-12-31 DIAGNOSIS — I10 Essential (primary) hypertension: Secondary | ICD-10-CM | POA: Insufficient documentation

## 2018-12-31 DIAGNOSIS — R05 Cough: Secondary | ICD-10-CM | POA: Diagnosis not present

## 2018-12-31 DIAGNOSIS — J4521 Mild intermittent asthma with (acute) exacerbation: Secondary | ICD-10-CM | POA: Diagnosis not present

## 2018-12-31 LAB — COMPREHENSIVE METABOLIC PANEL
ALT: 15 U/L (ref 0–44)
AST: 19 U/L (ref 15–41)
Albumin: 4 g/dL (ref 3.5–5.0)
Alkaline Phosphatase: 85 U/L (ref 38–126)
Anion gap: 12 (ref 5–15)
BUN: 22 mg/dL (ref 8–23)
CO2: 22 mmol/L (ref 22–32)
Calcium: 9.6 mg/dL (ref 8.9–10.3)
Chloride: 105 mmol/L (ref 98–111)
Creatinine, Ser: 0.83 mg/dL (ref 0.44–1.00)
GFR calc Af Amer: >60 mL/min (ref >60)
GFR calc non Af Amer: >60 mL/min (ref >60)
Glucose, Bld: 112 mg/dL — ABNORMAL HIGH (ref 70–99)
Potassium: 3.9 mmol/L (ref 3.5–5.1)
Sodium: 139 mmol/L (ref 135–145)
Total Bilirubin: 0.2 mg/dL — ABNORMAL LOW (ref 0.3–1.2)
Total Protein: 7.6 g/dL (ref 6.5–8.1)

## 2018-12-31 LAB — CBC WITH DIFFERENTIAL/PLATELET
Abs Immature Granulocytes: 0.17 10*3/uL — ABNORMAL HIGH (ref 0.00–0.07)
Basophils Absolute: 0 10*3/uL (ref 0.0–0.1)
Basophils Relative: 0 %
Eosinophils Absolute: 0 10*3/uL (ref 0.0–0.5)
Eosinophils Relative: 0 %
HCT: 45.6 % (ref 36.0–46.0)
Hemoglobin: 15.2 g/dL — ABNORMAL HIGH (ref 12.0–15.0)
Immature Granulocytes: 1 %
Lymphocytes Relative: 15 %
Lymphs Abs: 1.8 10*3/uL (ref 0.7–4.0)
MCH: 31.9 pg (ref 26.0–34.0)
MCHC: 33.3 g/dL (ref 30.0–36.0)
MCV: 95.8 fL (ref 80.0–100.0)
Monocytes Absolute: 0.3 10*3/uL (ref 0.1–1.0)
Monocytes Relative: 3 %
Neutro Abs: 9.8 10*3/uL — ABNORMAL HIGH (ref 1.7–7.7)
Neutrophils Relative %: 81 %
Platelets: 269 10*3/uL (ref 150–400)
RBC: 4.76 MIL/uL (ref 3.87–5.11)
RDW: 12.2 % (ref 11.5–15.5)
WBC: 12.1 10*3/uL — ABNORMAL HIGH (ref 4.0–10.5)
nRBC: 0 % (ref 0.0–0.2)

## 2018-12-31 MED ORDER — PSEUDOEPH-BROMPHEN-DM 30-2-10 MG/5ML PO SYRP: 5.0000 mL | ORAL_SOLUTION | Freq: Four times a day (QID) | ORAL | 0 refills | Status: AC | PRN

## 2018-12-31 MED ORDER — ALBUTEROL SULFATE HFA 108 (90 BASE) MCG/ACT IN AERS: 1.0000 | INHALATION_SPRAY | Freq: Four times a day (QID) | RESPIRATORY_TRACT | 0 refills | Status: DC | PRN

## 2018-12-31 MED ORDER — KETOROLAC TROMETHAMINE 15 MG/ML IJ SOLN
15.0000 mg | Freq: Once | INTRAMUSCULAR | Status: AC
  Administered 2018-12-31: 15 mg via INTRAVENOUS
  Filled 2018-12-31: qty 1

## 2018-12-31 MED ORDER — KETOROLAC TROMETHAMINE 60 MG/2ML IM SOLN
30.0000 mg | Freq: Once | INTRAMUSCULAR | Status: DC
  Filled 2018-12-31: qty 2

## 2018-12-31 MED ORDER — IPRATROPIUM-ALBUTEROL 0.5-2.5 (3) MG/3ML IN SOLN
3.0000 mL | Freq: Once | RESPIRATORY_TRACT | Status: AC
  Administered 2018-12-31: 3 mL via RESPIRATORY_TRACT
  Filled 2018-12-31: qty 3

## 2018-12-31 NOTE — ED Provider Notes (Signed)
[facility]-EMERGENCY DEPT Provider Note   CSN: [id number] Arrival date & time: 12/31/18  0423    History   Chief Complaint Chief Complaint  Patient presents with  . Nasal Congestion  . Generalized Body Aches    HPI Danielle [name] is a 68 y.o. female.     Patient is a 68 year old African-American female with past medical history of asthma, coronary artery disease, hypertension, bipolar disorder who presents the emergency department for URI symptoms.  She reports that "I have been sick since Christmas".  Reports that she has been on prednisone and antibiotics from her primary care doctor for the past week but is not getting any better.  She reports that she has been feeling feverish but did not take her temperature.  Denies travel anywhere outside of the state in the last several months.  Denies any known sick contacts.  Denies any shortness of breath. Endorses generalized body aches, back ache and non specific chest pain.  She does report that she has some achiness in both of her lower sides.  She reports she thinks she may need a breathing treatment.  She reports that she has not been able to take her blood pressure medicine in the last 2 weeks because she ran out.  Reports that she has been eating and drinking normally.  She does not have an inhaler at home despite her diagnosis of asthma.     Past Medical History:  Diagnosis Date  . Asthma   . Bipolar affective disorder, currently depressed, mild (HCC)   . Chest pain   . Complication of anesthesia    " My eyes swell up "  . Coronary artery disease   . Depression   . Fall 08/25/2012   Recent fall with residual knee and back pain.  . Family history of anesthesia complication    " [name] "  . Hypertension   . Memory disturbance 01/30/2016  . Stroke (HCC)   . Tardive dyskinesia   . Tobacco abuse     Patient Active Problem List   Diagnosis Date Noted  . Unstable angina (HCC)   . Abnormal  nuclear stress test   . Chest pain 08/13/2018  . Hypokalemia 08/11/2018  . Tobacco abuse 08/11/2018  . Chest pain with high risk for cardiac etiology 08/10/2018  . Memory disturbance 01/30/2016  . Acute encephalopathy 05/23/2014  . Altered mental status 12/08/2013  . Schizophrenia, paranoid type (HCC) 04/28/2012  . Cocaine abuse (HCC) 04/28/2012  . Cannabis abuse 04/28/2012  . Substance induced mood disorder (HCC) 04/28/2012  . Noncompliance 09/16/2011  . Anxiety 09/16/2011  . Constipation 09/16/2011  . Chest pain 09/14/2011  . SUBSTANCE ABUSE, MULTIPLE 12/03/2008  . HYPERLIPIDEMIA 08/01/2008  . MIGRAINE HEADACHE 08/01/2008  . ALLERGIC RHINITIS WITH CONJUNCTIVITIS 08/01/2008  . ASTHMA 08/01/2008  . DEPRESSION 06/20/2008  . URI 06/20/2008  . ANXIETY DISORDER, GENERALIZED 05/28/2008  . HYPERTENSION, BENIGN ESSENTIAL 05/28/2008  . GERD 05/28/2008  . BACTERIAL VAGINITIS 05/24/2008  . DEEP VENOUS THROMBOPHLEBITIS, LEFT, LEG, HX OF 10/18/1988    Past Surgical History:  Procedure Laterality Date  . ABDOMINAL HYSTERECTOMY    . CARDIAC CATHETERIZATION    . RIGHT/LEFT HEART CATH AND CORONARY ANGIOGRAPHY N/A 08/14/2018   Procedure: RIGHT/LEFT HEART CATH AND CORONARY ANGIOGRAPHY;  Surgeon: Marykay Lex, MD;  Location: [facility] INVASIVE CV LAB;  Service: Cardiovascular;  Laterality: N/A;     OB History   No obstetric history on file.      Home  Medications    Prior to Admission medications   Medication Sig Start Date End Date Taking? Authorizing Provider  albuterol (PROVENTIL HFA;VENTOLIN HFA) 108 (90 Base) MCG/ACT inhaler Inhale 1-2 puffs into the lungs every 6 (six) hours as needed for wheezing or shortness of breath (or cough). 12/31/18   Arlyn Dunning, PA-C  amLODipine (NORVASC) 2.5 MG tablet Take 1 tablet (2.5 mg total) by mouth daily. 08/15/18   Kathlen Mody, MD  aspirin 81 MG chewable tablet Chew 1 tablet (81 mg total) by mouth daily. 08/15/18   Kathlen Mody, MD   Brexpiprazole (REXULTI) 1 MG TABS Take 1 mg by mouth at bedtime.    [provider]  brompheniramine-pseudoephedrine-DM 30-2-10 MG/5ML syrup Take 5 mLs by mouth 4 (four) times daily as needed for up to 5 days. 12/31/18 01/05/19  Arlyn Dunning, PA-C  carvedilol (COREG) 6.25 MG tablet Take 1 tablet (6.25 mg total) by mouth 2 (two) times daily with a meal. 08/14/18   Kathlen Mody, MD  folic acid (FOLVITE) 1 MG tablet Take 1 tablet (1 mg total) by mouth daily. 08/15/18   Kathlen Mody, MD  hydrOXYzine (ATARAX/VISTARIL) 10 MG tablet Take 10 mg by mouth 2 (two) times daily as needed for anxiety.     [provider]  mirtazapine (REMERON) 15 MG tablet Take 7.5 mg by mouth at bedtime.     [provider]  Multiple Vitamin (MULTIVITAMIN WITH MINERALS) TABS tablet Take 1 tablet by mouth daily. 08/15/18   Kathlen Mody, MD  nicotine (NICODERM CQ - DOSED IN MG/24 HOURS) 14 mg/24hr patch Place 1 patch (14 mg total) onto the skin daily. 08/15/18   Kathlen Mody, MD  thiamine 100 MG tablet Take 1 tablet (100 mg total) by mouth daily. 08/15/18   Kathlen Mody, MD  Valbenazine Tosylate [facility]) 40 MG CAPS Take 40 mg by mouth every morning.    [provider]    Family History Family History  Problem Relation Age of Onset  . Hypertension Mother   . Stroke Mother   . Heart attack Mother   . Heart attack Father   . Heart attack Brother   . Heart attack Sister   . Dementia Neg Hx     Social History Social History   Tobacco Use  . Smoking status: Current Every Day Smoker    Packs/day: 1.00    Types: Cigarettes  . Smokeless tobacco: Never Used  Substance Use Topics  . Alcohol use: Yes    Alcohol/week: 0.0 standard drinks    Comment: About 1 qt per week  . Drug use: No    Types: Cocaine, Marijuana    Comment: Former user     Allergies   Clonazepam; Doxycycline; Fosinopril sodium; Hydrochlorothiazide w-triamterene; Penicillins; and Sulfa antibiotics    Review of Systems Review of Systems  Constitutional: Positive for fever (subjective). Negative for activity change, appetite change, chills, diaphoresis, fatigue and unexpected weight change.  HENT: Positive for congestion and rhinorrhea. Negative for dental problem, ear discharge, ear pain, sinus pressure, sinus pain, sneezing and sore throat.   Respiratory: Positive for cough. Negative for apnea, choking, chest tightness and shortness of breath.   Cardiovascular: Positive for chest pain.  Gastrointestinal: Negative for abdominal pain, diarrhea, nausea and vomiting.  Genitourinary: Negative for dysuria.  Musculoskeletal: Positive for back pain. Negative for arthralgias.  Skin: Negative for rash and wound.  Allergic/Immunologic: Negative for environmental allergies and food allergies.  Neurological: Negative for dizziness, light-headedness and headaches.  Physical Exam Updated Vital Signs BP (!) 150/114 (BP Location: Right Arm)   Pulse 86   Temp 98.6 F (37 C) (Oral)   Resp 18   Ht 5\' 1"  (1.549 m)   Wt 61.8 kg   SpO2 100%   BMI 25.73 kg/m   Physical Exam Vitals signs and nursing note reviewed.  HENT:     Head: Normocephalic and atraumatic.     Nose: Nose normal.     Mouth/Throat:     Mouth: Mucous membranes are moist.     Pharynx: No oropharyngeal exudate or posterior oropharyngeal erythema.  Eyes:     Conjunctiva/sclera: Conjunctivae normal.     Pupils: Pupils are equal, round, and reactive to light.  Neck:     Musculoskeletal: Neck supple.  Cardiovascular:     Rate and Rhythm: Normal rate and regular rhythm.  Pulmonary:     Effort: Pulmonary effort is normal.     Breath sounds: Normal breath sounds. No wheezing, rhonchi or rales.  Abdominal:     General: Abdomen is flat. Bowel sounds are normal.     Palpations: Abdomen is soft.  Musculoskeletal:     Right lower leg: No edema.     Left lower leg: No edema.  Skin:    General: Skin is warm.     Capillary  Refill: Capillary refill takes less than 2 seconds.  Neurological:     General: No focal deficit present.     Mental Status: She is alert.  Psychiatric:        Mood and Affect: Mood normal.     Comments: + tardive dyskinesia      ED Treatments / Results  Labs (all labs ordered are listed, but only abnormal results are displayed) Labs Reviewed  CBC WITH DIFFERENTIAL/PLATELET - Abnormal; Notable for the following components:      Result Value   WBC 12.1 (*)    Hemoglobin 15.2 (*)    Neutro Abs 9.8 (*)    Abs Immature Granulocytes 0.17 (*)    All other components within normal limits  COMPREHENSIVE METABOLIC PANEL - Abnormal; Notable for the following components:   Glucose, Bld 112 (*)    Total Bilirubin 0.2 (*)    All other components within normal limits    EKG EKG Interpretation  Date/Time:  Sunday December 31 2018 07:38:42 EDT Ventricular Rate:  92 PR Interval:    QRS Duration: 79 QT Interval:  375 QTC Calculation: 464 R Axis:   53 Text Interpretation:  Sinus rhythm Multiform ventricular premature complexes LAE, consider biatrial enlargement Abnormal R-wave progression, early transition Probable LVH with secondary repol abnrm Artifact in lead(s) I II III aVR aVL aVF Interpretation limited secondary to artifact Confirmed by Zadie Rhine ([id number]) on 12/31/2018 7:41:39 AM   Radiology Dg Chest 2 View  Result Date: 12/31/2018 CLINICAL DATA:  Cough and fever for 1 week. EXAM: CHEST - 2 VIEW COMPARISON:  08/10/2018 and prior exams FINDINGS: Cardiomegaly and mild peribronchial thickening again noted. There is no evidence of focal airspace disease, pulmonary edema, suspicious pulmonary nodule/mass, pleural effusion, or pneumothorax. No acute bony abnormalities are identified. IMPRESSION: Cardiomegaly without evidence of acute cardiopulmonary disease. Electronically Signed   By: Harmon Pier M.D.   On: 12/31/2018 07:16    Procedures Procedures (including critical care time)   Medications Ordered in ED Medications  ketorolac (TORADOL) injection 30 mg (has no administration in time range)  ipratropium-albuterol (DUONEB) 0.5-2.5 (3) MG/3ML nebulizer solution 3 mL (3 mLs Nebulization  Given 12/31/18 [id number])     Initial Impression / Assessment and Plan / ED Course  I have reviewed the triage vital signs and the nursing notes.  Pertinent labs & imaging results that were available during my care of the patient were reviewed by me and considered in my medical decision making (see chart for details).  Clinical Course as of Dec 31 799  Sun Dec 31, 2018  0654 Patient stable, workup unremarkable. Will treat for asthma flare/ viral bronchitis. She just completed steroids and abx. Chest xray clear. I will give her inhaler and bromfed   [KM]    Clinical Course User Index [KM] Arlyn DunningMcLean, Bertine Schlottman A, PA-C       Based on review of vitals, medical screening exam, lab work and/or imaging, there does not appear to be an acute, emergent etiology for the patient's symptoms. Counseled pt on good return precautions and encouraged both PCP and ED follow-up as needed.  Prior to discharge, I also discussed incidental imaging findings with patient in detail and advised appropriate, recommended follow-up in detail.  Clinical Impression: 1. Mild intermittent asthma without complication     Disposition: Discharge  Prior to providing a prescription for a controlled substance, I independently reviewed the patient's recent prescription history on the [place]  Controlled Substance Reporting System. The patient had no recent or regular prescriptions and was deemed appropriate for a brief, less than 3 day prescription of narcotic for acute analgesia.  This note was prepared with assistance of Conservation officer, historic buildingsDragon voice recognition software. Occasional wrong-word or sound-a-like substitutions may have occurred due to the inherent limitations of voice recognition software.   Final Clinical Impressions(s)  / ED Diagnoses   Final diagnoses:  Mild intermittent asthma without complication    ED Discharge Orders         Ordered    albuterol (PROVENTIL HFA;VENTOLIN HFA) 108 (90 Base) MCG/ACT inhaler  Every 6 hours PRN     12/31/18 0801    brompheniramine-pseudoephedrine-DM 30-2-10 MG/5ML syrup  4 times daily PRN     12/31/18 0801           Arlyn DunningMcLean, Shuaib Corsino A, PA-C 12/31/18 0801    Zadie RhineWickline, Donald, MD 12/31/18 2340

## 2018-12-31 NOTE — ED Provider Notes (Signed)
Patient seen/examined in the Emergency Department in conjunction with Advanced Practice Provider  Patient reports multiple complaints, cough since Christmas, chest wall pain, back pain and congestion Exam : awake/alert, anxious, tardive dyskinesia noted Lung sound are clear.  No focal weakness noted Plan: imaging/labs/EKG pending If no acute findings patient can be discharged    Zadie Rhine, MD 12/31/18 [phone]

## 2018-12-31 NOTE — Discharge Instructions (Addendum)
Thank you for allowing me to care for you today. Please return to the emergency department if you have new or worsening symptoms. Take your medications as instructed.  ° °

## 2018-12-31 NOTE — ED Notes (Signed)
Patient transported to X-ray 

## 2018-12-31 NOTE — ED Triage Notes (Signed)
Pt reports generalized body aches (mostly back and abdomen), congestion, sneezing, chest soreness, dry cough, and fever over the last week. She reports that she just finished some antibiotics and steroids from the [facility], but does not feel better. Speaking in complete sentences, but voice sounds muffled. Denies any recent travel. No known contact with sick individuals.

## 2019-01-01 DIAGNOSIS — J209 Acute bronchitis, unspecified: Secondary | ICD-10-CM | POA: Diagnosis not present

## 2019-01-01 DIAGNOSIS — R0981 Nasal congestion: Secondary | ICD-10-CM | POA: Diagnosis not present

## 2019-01-01 DIAGNOSIS — I10 Essential (primary) hypertension: Secondary | ICD-10-CM | POA: Diagnosis not present

## 2019-01-01 DIAGNOSIS — R0602 Shortness of breath: Secondary | ICD-10-CM | POA: Diagnosis not present

## 2019-01-12 DIAGNOSIS — G2401 Drug induced subacute dyskinesia: Secondary | ICD-10-CM | POA: Diagnosis not present

## 2019-01-12 DIAGNOSIS — J209 Acute bronchitis, unspecified: Secondary | ICD-10-CM | POA: Diagnosis not present

## 2019-01-12 DIAGNOSIS — R0602 Shortness of breath: Secondary | ICD-10-CM | POA: Diagnosis not present

## 2019-01-12 DIAGNOSIS — J449 Chronic obstructive pulmonary disease, unspecified: Secondary | ICD-10-CM | POA: Diagnosis not present

## 2019-01-23 DIAGNOSIS — R0602 Shortness of breath: Secondary | ICD-10-CM | POA: Diagnosis not present

## 2019-01-23 DIAGNOSIS — I10 Essential (primary) hypertension: Secondary | ICD-10-CM | POA: Diagnosis not present

## 2019-01-23 DIAGNOSIS — J209 Acute bronchitis, unspecified: Secondary | ICD-10-CM | POA: Diagnosis not present

## 2019-02-06 NOTE — Congregational Nurse Program (Signed)
Patient was screened for COVID 19 homeless shelter services.   Patient stated she was doing well and had no complaints.   Laurell Josephs, RN

## 2019-02-19 NOTE — Progress Notes (Signed)
Closing encounter per request. 

## 2019-02-20 NOTE — Progress Notes (Signed)
COVID Hotel Screening performed. Temperature, PHQ-9, and need for medical care and medications assessed. No additional needs assessed at this time.  Mekiah Cambridge RN MSN 

## 2019-03-21 ENCOUNTER — Encounter (HOSPITAL_COMMUNITY): Payer: Self-pay | Admitting: Emergency Medicine

## 2019-03-21 ENCOUNTER — Inpatient Hospital Stay (HOSPITAL_COMMUNITY)
Admission: EM | Admit: 2019-03-21 | Discharge: 2019-03-23 | DRG: 312 | Disposition: A | Discharge status: Home / Self Care | Payer: Medicare Other | Attending: Internal Medicine | Admitting: Internal Medicine

## 2019-03-21 ENCOUNTER — Emergency Department (HOSPITAL_COMMUNITY): Payer: Medicare Other

## 2019-03-21 ENCOUNTER — Other Ambulatory Visit: Payer: Self-pay

## 2019-03-21 DIAGNOSIS — I10 Essential (primary) hypertension: Secondary | ICD-10-CM | POA: Diagnosis present

## 2019-03-21 DIAGNOSIS — I491 Atrial premature depolarization: Secondary | ICD-10-CM | POA: Diagnosis not present

## 2019-03-21 DIAGNOSIS — E861 Hypovolemia: Secondary | ICD-10-CM | POA: Diagnosis not present

## 2019-03-21 DIAGNOSIS — E860 Dehydration: Secondary | ICD-10-CM | POA: Diagnosis present

## 2019-03-21 DIAGNOSIS — E876 Hypokalemia: Secondary | ICD-10-CM | POA: Diagnosis present

## 2019-03-21 DIAGNOSIS — I2510 Atherosclerotic heart disease of native coronary artery without angina pectoris: Secondary | ICD-10-CM | POA: Diagnosis present

## 2019-03-21 DIAGNOSIS — E872 Acidosis: Secondary | ICD-10-CM | POA: Diagnosis present

## 2019-03-21 DIAGNOSIS — Z79899 Other long term (current) drug therapy: Secondary | ICD-10-CM | POA: Diagnosis not present

## 2019-03-21 DIAGNOSIS — F322 Major depressive disorder, single episode, severe without psychotic features: Secondary | ICD-10-CM | POA: Diagnosis not present

## 2019-03-21 DIAGNOSIS — I959 Hypotension, unspecified: Secondary | ICD-10-CM | POA: Diagnosis present

## 2019-03-21 DIAGNOSIS — R0902 Hypoxemia: Secondary | ICD-10-CM | POA: Diagnosis not present

## 2019-03-21 DIAGNOSIS — G2401 Drug induced subacute dyskinesia: Secondary | ICD-10-CM | POA: Diagnosis not present

## 2019-03-21 DIAGNOSIS — Z20828 Contact with and (suspected) exposure to other viral communicable diseases: Secondary | ICD-10-CM | POA: Diagnosis not present

## 2019-03-21 DIAGNOSIS — Z590 Homelessness: Secondary | ICD-10-CM

## 2019-03-21 DIAGNOSIS — Z7982 Long term (current) use of aspirin: Secondary | ICD-10-CM

## 2019-03-21 DIAGNOSIS — Z8673 Personal history of transient ischemic attack (TIA), and cerebral infarction without residual deficits: Secondary | ICD-10-CM

## 2019-03-21 DIAGNOSIS — R55 Syncope and collapse: Secondary | ICD-10-CM | POA: Diagnosis not present

## 2019-03-21 DIAGNOSIS — F411 Generalized anxiety disorder: Secondary | ICD-10-CM | POA: Diagnosis not present

## 2019-03-21 DIAGNOSIS — Z8249 Family history of ischemic heart disease and other diseases of the circulatory system: Secondary | ICD-10-CM

## 2019-03-21 DIAGNOSIS — Z823 Family history of stroke: Secondary | ICD-10-CM

## 2019-03-21 DIAGNOSIS — R404 Transient alteration of awareness: Secondary | ICD-10-CM | POA: Diagnosis not present

## 2019-03-21 DIAGNOSIS — F259 Schizoaffective disorder, unspecified: Secondary | ICD-10-CM | POA: Diagnosis present

## 2019-03-21 DIAGNOSIS — I9589 Other hypotension: Secondary | ICD-10-CM

## 2019-03-21 DIAGNOSIS — I340 Nonrheumatic mitral (valve) insufficiency: Secondary | ICD-10-CM | POA: Diagnosis present

## 2019-03-21 DIAGNOSIS — J9811 Atelectasis: Secondary | ICD-10-CM | POA: Diagnosis not present

## 2019-03-21 DIAGNOSIS — R231 Pallor: Secondary | ICD-10-CM | POA: Diagnosis not present

## 2019-03-21 HISTORY — DX: Syncope and collapse: R55

## 2019-03-21 LAB — URINALYSIS, ROUTINE W REFLEX MICROSCOPIC
Bilirubin Urine: NEGATIVE
Glucose, UA: NEGATIVE mg/dL
Hgb urine dipstick: NEGATIVE
Ketones, ur: NEGATIVE mg/dL
Leukocytes,Ua: NEGATIVE
Nitrite: NEGATIVE
Protein, ur: 30 mg/dL — AB
Specific Gravity, Urine: 1.017 (ref 1.005–1.030)
pH: 6 (ref 5.0–8.0)

## 2019-03-21 LAB — CBC
HCT: 39.8 % (ref 36.0–46.0)
Hemoglobin: 13.2 g/dL (ref 12.0–15.0)
MCH: 32 pg (ref 26.0–34.0)
MCHC: 33.2 g/dL (ref 30.0–36.0)
MCV: 96.4 fL (ref 80.0–100.0)
Platelets: 236 10*3/uL (ref 150–400)
RBC: 4.13 MIL/uL (ref 3.87–5.11)
RDW: 12.6 % (ref 11.5–15.5)
WBC: 4.9 10*3/uL (ref 4.0–10.5)
nRBC: 0 % (ref 0.0–0.2)

## 2019-03-21 LAB — BASIC METABOLIC PANEL
Anion gap: 15 (ref 5–15)
Anion gap: 8 (ref 5–15)
BUN: 13 mg/dL (ref 8–23)
BUN: 10 mg/dL (ref 8–23)
CO2: 17 mmol/L — ABNORMAL LOW (ref 22–32)
CO2: 21 mmol/L — ABNORMAL LOW (ref 22–32)
Calcium: 9.3 mg/dL (ref 8.9–10.3)
Calcium: 9.2 mg/dL (ref 8.9–10.3)
Chloride: 109 mmol/L (ref 98–111)
Chloride: 110 mmol/L (ref 98–111)
Creatinine, Ser: 0.97 mg/dL (ref 0.44–1.00)
Creatinine, Ser: 0.88 mg/dL (ref 0.44–1.00)
GFR calc Af Amer: >60 mL/min (ref >60)
GFR calc Af Amer: >60 mL/min (ref >60)
GFR calc non Af Amer: >60 mL/min (ref >60)
GFR calc non Af Amer: >60 mL/min (ref >60)
Glucose, Bld: 152 mg/dL — ABNORMAL HIGH (ref 70–99)
Glucose, Bld: 125 mg/dL — ABNORMAL HIGH (ref 70–99)
Potassium: 2.6 mmol/L — CL (ref 3.5–5.1)
Potassium: 3.7 mmol/L (ref 3.5–5.1)
Sodium: 141 mmol/L (ref 135–145)
Sodium: 139 mmol/L (ref 135–145)

## 2019-03-21 LAB — MAGNESIUM: Magnesium: 1.7 mg/dL (ref 1.7–2.4)

## 2019-03-21 LAB — TROPONIN I
Troponin I: <0.03 ng/mL (ref <0.03)
Troponin I: <0.03 ng/mL (ref <0.03)

## 2019-03-21 LAB — CBG MONITORING, ED: Glucose-Capillary: 135 mg/dL — ABNORMAL HIGH (ref 70–99)

## 2019-03-21 LAB — LACTIC ACID, PLASMA
Lactic Acid, Venous: 3 mmol/L (ref 0.5–1.9)
Lactic Acid, Venous: 1 mmol/L (ref 0.5–1.9)

## 2019-03-21 LAB — SARS CORONAVIRUS 2 BY RT PCR (HOSPITAL ORDER, PERFORMED IN ~~LOC~~ HOSPITAL LAB): SARS Coronavirus 2: NEGATIVE

## 2019-03-21 MED ORDER — POTASSIUM CHLORIDE 10 MEQ/100ML IV SOLN
10.0000 meq | INTRAVENOUS | Status: AC
  Administered 2019-03-21: 10 meq via INTRAVENOUS
  Filled 2019-03-21: qty 100

## 2019-03-21 MED ORDER — SODIUM CHLORIDE 0.9% FLUSH
3.0000 mL | Freq: Once | INTRAVENOUS | Status: AC
  Administered 2019-03-21: 3 mL via INTRAVENOUS

## 2019-03-21 MED ORDER — SODIUM CHLORIDE 0.9 % IV BOLUS
1000.0000 mL | Freq: Once | INTRAVENOUS | Status: AC
  Administered 2019-03-21: 1000 mL via INTRAVENOUS

## 2019-03-21 MED ORDER — ENOXAPARIN SODIUM 40 MG/0.4ML ~~LOC~~ SOLN
40.0000 mg | SUBCUTANEOUS | Status: DC
  Administered 2019-03-22: 40 mg via SUBCUTANEOUS
  Filled 2019-03-21 (×2): qty 0.4

## 2019-03-21 MED ORDER — ACETAMINOPHEN 650 MG RE SUPP: 650.0000 mg | Freq: Four times a day (QID) | RECTAL | Status: DC | PRN

## 2019-03-21 MED ORDER — POLYETHYLENE GLYCOL 3350 17 G PO PACK: 17.0000 g | PACK | Freq: Every day | ORAL | Status: DC | PRN

## 2019-03-21 MED ORDER — MAGNESIUM SULFATE 2 GM/50ML IV SOLN
2.0000 g | Freq: Once | INTRAVENOUS | Status: AC
  Administered 2019-03-21: 2 g via INTRAVENOUS
  Filled 2019-03-21: qty 50

## 2019-03-21 MED ORDER — DICLOFENAC SODIUM 1 % TD GEL
2.0000 g | Freq: Four times a day (QID) | TRANSDERMAL | Status: DC
  Administered 2019-03-21 – 2019-03-23 (×5): 2 g via TOPICAL
  Filled 2019-03-21: qty 100

## 2019-03-21 MED ORDER — ALBUTEROL SULFATE (2.5 MG/3ML) 0.083% IN NEBU: 2.5000 mg | INHALATION_SOLUTION | Freq: Four times a day (QID) | RESPIRATORY_TRACT | Status: DC | PRN

## 2019-03-21 MED ORDER — FOLIC ACID 1 MG PO TABS
1.0000 mg | ORAL_TABLET | Freq: Every day | ORAL | Status: DC
  Administered 2019-03-21 – 2019-03-23 (×3): 1 mg via ORAL
  Filled 2019-03-21 (×3): qty 1

## 2019-03-21 MED ORDER — POTASSIUM CHLORIDE CRYS ER 20 MEQ PO TBCR
60.0000 meq | EXTENDED_RELEASE_TABLET | Freq: Once | ORAL | Status: AC
  Administered 2019-03-21: 60 meq via ORAL
  Filled 2019-03-21: qty 3

## 2019-03-21 MED ORDER — VITAMIN B-1 100 MG PO TABS
100.0000 mg | ORAL_TABLET | Freq: Every day | ORAL | Status: DC
  Administered 2019-03-21 – 2019-03-23 (×3): 100 mg via ORAL
  Filled 2019-03-21 (×4): qty 1

## 2019-03-21 MED ORDER — QUETIAPINE FUMARATE 25 MG PO TABS
100.0000 mg | ORAL_TABLET | Freq: Every day | ORAL | Status: DC
  Administered 2019-03-22: 100 mg via ORAL
  Filled 2019-03-21: qty 4

## 2019-03-21 MED ORDER — ASPIRIN 81 MG PO CHEW
81.0000 mg | CHEWABLE_TABLET | Freq: Every day | ORAL | Status: DC
  Administered 2019-03-21 – 2019-03-23 (×3): 81 mg via ORAL
  Filled 2019-03-21 (×3): qty 1

## 2019-03-21 MED ORDER — SERTRALINE HCL 50 MG PO TABS
50.0000 mg | ORAL_TABLET | Freq: Every day | ORAL | Status: DC
  Administered 2019-03-22 – 2019-03-23 (×2): 50 mg via ORAL
  Filled 2019-03-21 (×2): qty 1

## 2019-03-21 MED ORDER — ADULT MULTIVITAMIN W/MINERALS CH
1.0000 | ORAL_TABLET | Freq: Every day | ORAL | Status: DC
  Administered 2019-03-21 – 2019-03-23 (×3): 1 via ORAL
  Filled 2019-03-21 (×3): qty 1

## 2019-03-21 MED ORDER — MAGNESIUM SULFATE 2 GM/50ML IV SOLN: 2.0000 g | Freq: Once | INTRAVENOUS | Status: DC

## 2019-03-21 MED ORDER — SODIUM CHLORIDE 0.9 % IV SOLN
INTRAVENOUS | Status: DC | PRN
  Administered 2019-03-21: 250 mL via INTRAVENOUS

## 2019-03-21 MED ORDER — ACETAMINOPHEN 325 MG PO TABS: 650.0000 mg | ORAL_TABLET | Freq: Four times a day (QID) | ORAL | Status: DC | PRN

## 2019-03-21 NOTE — Progress Notes (Signed)
Called to attempt report from [facility] but no answer at ED, will call back

## 2019-03-21 NOTE — ED Triage Notes (Signed)
Arrived via EMS from the [employer] [place]. Patient found sitting on a bench unresponsive for 2-3 minutes. Alert to person and place. Staff stated unknown how long sitting on the bench. Patient states is homeless. Initial BP from medical staff at resource center 73/50. EMS administered 0.9 normal saline and CBG 188. Repeated BP 104/64.
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
## 2019-03-21 NOTE — Progress Notes (Signed)
CRITICAL VALUE ALERT  Critical Value: lactic acid = 3.0  Date & Time Notied: 2027  Provider Notified PGY2 [place]   Orders Received/Actions take: see new order

## 2019-03-21 NOTE — Progress Notes (Signed)
Pt received to 3e20 from ED, pt oriented to call light, phone, CCMD called and box 38 confirmed withNT Koren Bound, pt instructed to call for assist as she was found [place] and for safety she needs someone with her, reviewed orders and poc, tray ordered for dinner, verbalized understanding, pt c/o dull pain in upper back to neck and says the kpad will not help, she reports taking ibuprofen at home, will paged MD to request
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
## 2019-03-21 NOTE — ED Notes (Signed)
Got patient into a gown on the monitor patient is resting with call bell in reach 

## 2019-03-22 ENCOUNTER — Encounter (HOSPITAL_COMMUNITY): Payer: Self-pay | Admitting: General Practice

## 2019-03-22 ENCOUNTER — Other Ambulatory Visit: Payer: Self-pay

## 2019-03-22 ENCOUNTER — Observation Stay (HOSPITAL_COMMUNITY): Payer: Medicare Other

## 2019-03-22 DIAGNOSIS — Z590 Homelessness: Secondary | ICD-10-CM | POA: Diagnosis not present

## 2019-03-22 DIAGNOSIS — I10 Essential (primary) hypertension: Secondary | ICD-10-CM

## 2019-03-22 DIAGNOSIS — I69322 Dysarthria following cerebral infarction: Secondary | ICD-10-CM

## 2019-03-22 DIAGNOSIS — R55 Syncope and collapse: Secondary | ICD-10-CM | POA: Diagnosis not present

## 2019-03-22 DIAGNOSIS — E876 Hypokalemia: Secondary | ICD-10-CM

## 2019-03-22 DIAGNOSIS — I959 Hypotension, unspecified: Secondary | ICD-10-CM | POA: Diagnosis present

## 2019-03-22 DIAGNOSIS — Z888 Allergy status to other drugs, medicaments and biological substances status: Secondary | ICD-10-CM

## 2019-03-22 DIAGNOSIS — Z7982 Long term (current) use of aspirin: Secondary | ICD-10-CM

## 2019-03-22 DIAGNOSIS — Z20828 Contact with and (suspected) exposure to other viral communicable diseases: Secondary | ICD-10-CM | POA: Diagnosis present

## 2019-03-22 DIAGNOSIS — Z598 Other problems related to housing and economic circumstances: Secondary | ICD-10-CM

## 2019-03-22 DIAGNOSIS — Z79899 Other long term (current) drug therapy: Secondary | ICD-10-CM

## 2019-03-22 DIAGNOSIS — Z638 Other specified problems related to primary support group: Secondary | ICD-10-CM

## 2019-03-22 DIAGNOSIS — E861 Hypovolemia: Secondary | ICD-10-CM | POA: Diagnosis present

## 2019-03-22 DIAGNOSIS — I340 Nonrheumatic mitral (valve) insufficiency: Secondary | ICD-10-CM | POA: Diagnosis not present

## 2019-03-22 DIAGNOSIS — F319 Bipolar disorder, unspecified: Secondary | ICD-10-CM | POA: Diagnosis not present

## 2019-03-22 DIAGNOSIS — J45909 Unspecified asthma, uncomplicated: Secondary | ICD-10-CM

## 2019-03-22 DIAGNOSIS — Z8673 Personal history of transient ischemic attack (TIA), and cerebral infarction without residual deficits: Secondary | ICD-10-CM | POA: Diagnosis not present

## 2019-03-22 DIAGNOSIS — E872 Acidosis: Secondary | ICD-10-CM | POA: Diagnosis not present

## 2019-03-22 DIAGNOSIS — F411 Generalized anxiety disorder: Secondary | ICD-10-CM

## 2019-03-22 DIAGNOSIS — Z885 Allergy status to narcotic agent status: Secondary | ICD-10-CM

## 2019-03-22 DIAGNOSIS — F259 Schizoaffective disorder, unspecified: Secondary | ICD-10-CM | POA: Diagnosis not present

## 2019-03-22 DIAGNOSIS — I2510 Atherosclerotic heart disease of native coronary artery without angina pectoris: Secondary | ICD-10-CM | POA: Diagnosis present

## 2019-03-22 DIAGNOSIS — Z881 Allergy status to other antibiotic agents status: Secondary | ICD-10-CM

## 2019-03-22 DIAGNOSIS — I361 Nonrheumatic tricuspid (valve) insufficiency: Secondary | ICD-10-CM

## 2019-03-22 DIAGNOSIS — F200 Paranoid schizophrenia: Secondary | ICD-10-CM

## 2019-03-22 DIAGNOSIS — Z823 Family history of stroke: Secondary | ICD-10-CM | POA: Diagnosis not present

## 2019-03-22 DIAGNOSIS — Z880 Allergy status to penicillin: Secondary | ICD-10-CM

## 2019-03-22 DIAGNOSIS — E860 Dehydration: Secondary | ICD-10-CM | POA: Diagnosis present

## 2019-03-22 DIAGNOSIS — Z8249 Family history of ischemic heart disease and other diseases of the circulatory system: Secondary | ICD-10-CM | POA: Diagnosis not present

## 2019-03-22 LAB — BASIC METABOLIC PANEL
Anion gap: 10 (ref 5–15)
BUN: 11 mg/dL (ref 8–23)
CO2: 22 mmol/L (ref 22–32)
Calcium: 9.2 mg/dL (ref 8.9–10.3)
Chloride: 109 mmol/L (ref 98–111)
Creatinine, Ser: 0.99 mg/dL (ref 0.44–1.00)
GFR calc Af Amer: >60 mL/min (ref >60)
GFR calc non Af Amer: 60 mL/min — ABNORMAL LOW (ref >60)
Glucose, Bld: 126 mg/dL — ABNORMAL HIGH (ref 70–99)
Potassium: 3.3 mmol/L — ABNORMAL LOW (ref 3.5–5.1)
Sodium: 141 mmol/L (ref 135–145)

## 2019-03-22 LAB — ECHOCARDIOGRAM COMPLETE
Height: 61 in
Weight: 2156.8 oz

## 2019-03-22 LAB — PHOSPHORUS: Phosphorus: 2.1 mg/dL — ABNORMAL LOW (ref 2.5–4.6)

## 2019-03-22 LAB — MAGNESIUM: Magnesium: 2.2 mg/dL (ref 1.7–2.4)

## 2019-03-22 LAB — TROPONIN I: Troponin I: <0.03 ng/mL (ref <0.03)

## 2019-03-22 MED ORDER — QUETIAPINE FUMARATE 25 MG PO TABS: 100.0000 mg | ORAL_TABLET | Freq: Every day | ORAL | Status: DC

## 2019-03-22 MED ORDER — K PHOS MONO-SOD PHOS DI & MONO 155-852-130 MG PO TABS
1000.0000 mg | ORAL_TABLET | Freq: Three times a day (TID) | ORAL | Status: AC
  Administered 2019-03-22 (×3): 1000 mg via ORAL
  Filled 2019-03-22 (×3): qty 4

## 2019-03-22 MED ORDER — SENNA 8.6 MG PO TABS
1.0000 | ORAL_TABLET | Freq: Every day | ORAL | Status: DC
  Administered 2019-03-22: 8.6 mg via ORAL
  Filled 2019-03-22 (×2): qty 1

## 2019-03-22 NOTE — Progress Notes (Signed)
Follow up apt made with the [facility] for 04/07/2019 at 9:30 am; Alexis Goodell [phone]

## 2019-03-22 NOTE — TOC Progression Note (Addendum)
Transition of Care [facility]) - Progression Note    Patient Details  Name: Danielle [name] MRN: [id number] Date of Birth: Dec 29, 1950  [facility] CM/SW Contact  Eduard Roux, [place] Phone Number: 03/22/2019, 5:57 PM  Clinical Narrative:     CSW consulted for homelessness concerns. Patient states she was staying at hotel funded by the [facility] for several weeks prior to moving with her daughter. Prior to staying at the hotel she was living with her boyfriend, who died on 2019/03/03. Patient states her daughter states she can not live with her and she has not toher place to live. Patient states she has other children and brothers and sisters but they are unable to help her and she do not have a good relationship with them.  CSW contacted the patient's daughter and states the patient can not stay with her. Patient's daughter states once the patient receives her portion of her SSI income, She  abuse drugs and alcohol.Patient denies drug and alcohol abuse. Patient's daughter strongly expressed the patient can never return to her home.   Patient states she has she has a payee Imelda Pillow. With [employer] services @ [phone]. CSW called but was to leave voice mail or reach someone. Patient states she is seen by psychiatrist at [facility] and and has been involved with the ACT Team in the past.   CSW informed patient we can only provide housing/shelter resources. Patient is familiar with [facility] and [employer]. Patient refused housing resources in surrounding counties.   Antony Blackbird, MSW, LCSWA Clinical Social Worker [phone]        Expected Discharge Plan and Services                                                 Social Determinants of Health (SDOH) Interventions    Readmission Risk Interventions No flowsheet data found.

## 2019-03-22 NOTE — Progress Notes (Signed)
  Date: 03/22/2019  Patient name: Danielle [name]  Medical record number: [id number]  Date of birth: 1951/01/17   I have seen and evaluated this patient and I have discussed the plan of care with the house staff. Please see their note for complete details. I concur with their findings with the following additions/corrections:   Please see my separate attestation from 03/21/19  Jessy Oto, M.D., Ph.D. 03/22/2019, 3:28 PM
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
## 2019-03-22 NOTE — Progress Notes (Signed)
  Echocardiogram 2D Echocardiogram has been performed.  Jessicah Croll L Androw 03/22/2019, 10:48 AM

## 2019-03-23 DIAGNOSIS — F259 Schizoaffective disorder, unspecified: Secondary | ICD-10-CM

## 2019-03-23 LAB — BASIC METABOLIC PANEL
Anion gap: 11 (ref 5–15)
BUN: 9 mg/dL (ref 8–23)
CO2: 25 mmol/L (ref 22–32)
Calcium: 9 mg/dL (ref 8.9–10.3)
Chloride: 102 mmol/L (ref 98–111)
Creatinine, Ser: 0.74 mg/dL (ref 0.44–1.00)
GFR calc Af Amer: >60 mL/min (ref >60)
GFR calc non Af Amer: >60 mL/min (ref >60)
Glucose, Bld: 96 mg/dL (ref 70–99)
Potassium: 3.5 mmol/L (ref 3.5–5.1)
Sodium: 138 mmol/L (ref 135–145)

## 2019-03-23 LAB — HEMOGLOBIN A1C
Hgb A1c MFr Bld: 5.5 % (ref 4.8–5.6)
Mean Plasma Glucose: 111.15 mg/dL

## 2019-03-23 LAB — LIPID PANEL
Cholesterol: 213 mg/dL — ABNORMAL HIGH (ref 0–200)
HDL: 68 mg/dL (ref >40)
LDL Cholesterol: 127 mg/dL — ABNORMAL HIGH (ref 0–99)
Total CHOL/HDL Ratio: 3.1 RATIO
Triglycerides: 88 mg/dL (ref <150)
VLDL: 18 mg/dL (ref 0–40)

## 2019-03-23 LAB — MAGNESIUM: Magnesium: 1.7 mg/dL (ref 1.7–2.4)

## 2019-03-23 LAB — PHOSPHORUS: Phosphorus: 4.3 mg/dL (ref 2.5–4.6)

## 2019-03-23 MED ORDER — ATORVASTATIN CALCIUM 20 MG PO TABS: 20.0000 mg | ORAL_TABLET | Freq: Every day | ORAL | 0 refills | Status: DC

## 2019-03-23 MED ORDER — ASPIRIN 81 MG PO CHEW: 81.0000 mg | CHEWABLE_TABLET | Freq: Every day | ORAL | 0 refills | Status: DC

## 2019-03-23 MED ORDER — POTASSIUM CHLORIDE CRYS ER 20 MEQ PO TBCR
20.0000 meq | EXTENDED_RELEASE_TABLET | Freq: Once | ORAL | Status: AC
  Administered 2019-03-23: 20 meq via ORAL
  Filled 2019-03-23: qty 1

## 2019-03-23 MED ORDER — CARVEDILOL 6.25 MG PO TABS: 6.2500 mg | ORAL_TABLET | Freq: Two times a day (BID) | ORAL | 0 refills | Status: DC

## 2019-03-23 MED ORDER — QUETIAPINE FUMARATE 100 MG PO TABS: 100.0000 mg | ORAL_TABLET | Freq: Every day | ORAL | 0 refills | Status: DC

## 2019-03-23 MED ORDER — AMLODIPINE BESYLATE 2.5 MG PO TABS: 2.5000 mg | ORAL_TABLET | Freq: Every day | ORAL | 0 refills | Status: DC

## 2019-03-23 MED ORDER — ATORVASTATIN CALCIUM 10 MG PO TABS
20.0000 mg | ORAL_TABLET | Freq: Every day | ORAL | Status: DC
  Administered 2019-03-23: 20 mg via ORAL
  Filled 2019-03-23: qty 2

## 2019-03-23 MED ORDER — SERTRALINE HCL 50 MG PO TABS: 50.0000 mg | ORAL_TABLET | Freq: Every day | ORAL | 0 refills | Status: DC

## 2019-03-23 MED FILL — ATORVASTATIN CALCIUM 20 MG: 20 | 30 days supply | Qty: 30 | Fill #0

## 2019-03-23 MED FILL — CARVEDILOL 6.25 MG TABLET: 6.25 | 30 days supply | Qty: 60 | Fill #0

## 2019-03-23 MED FILL — AMLODIPINE BESYLATE 2.5 MG: 2.5 | 30 days supply | Qty: 30 | Fill #0

## 2019-03-23 MED FILL — SERTRALINE HCL 50 MG TABLET: 50 | 30 days supply | Qty: 30 | Fill #0

## 2019-03-23 MED FILL — QUETIAPINE FUMARATE 100 MG: 100 | 30 days supply | Qty: 30 | Fill #0

## 2019-03-23 MED FILL — ASPIRIN LOW DOSE 81 MG CHEW: 81 | 30 days supply | Qty: 30 | Fill #0
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
## 2019-03-23 NOTE — TOC Progression Note (Signed)
Transition of Care [facility]) - Progression Note    Patient Details  Name: Rachelanne Bassano MRN: [id number] Date of Birth: [birth year]  [facility] of [facility] CM/SW Contact  Eduard Roux, [place] Phone Number: 03/23/2019, 4:25 PM  Clinical Narrative:     CSW visit with patient's to provide assistance with finding shelter. CSW called [employer]( she was placed on waiting list), [facility] Most, [facility]( no answer) and [employer]- (unable to reach someone).  Antony Blackbird, MSW, [facility] Clinical Social Worker [phone]        Expected Discharge Plan and Services           Expected Discharge Date: 03/23/19                                     Social Determinants of Health (SDOH) Interventions    Readmission Risk Interventions No flowsheet data found.

## 2019-03-23 NOTE — Evaluation (Signed)
Occupational Therapy Evaluation Patient Details Name: Danielle [name] MRN: [id number] DOB: December 14, 1950 Today's Date: 03/23/2019    History of Present Illness 68 yo found unconscious on a park bench with hypotension. PmHx: HTN, bipolar, CVA, CAD, dysarthria   Clinical Impression   Pt presents to OT with mild balance deficits and slight cognitive deficits that impact her safety awareness and engagement in appropriate ADLs/IADLs. Pt is mod I with ADLs and functional mobility. However, pt is homeless and requires cueing for safety awareness and activity pacing. Pt would benefit from supervision and community level intervention for ensuring safe access to needs. OT will continue to follow acutely.     Follow Up Recommendations  No OT follow up    Equipment Recommendations  None recommended by OT       Precautions / Restrictions Precautions Precautions: Fall Restrictions Weight Bearing Restrictions: No      Mobility Bed Mobility Overal bed mobility: Modified Independent                Transfers Overall transfer level: Modified independent                    Balance Overall balance assessment: Mild deficits observed, not formally tested                                         ADL either performed or assessed with clinical judgement   ADL Overall ADL's : Needs assistance/impaired Eating/Feeding: Modified independent   Grooming: Wash/dry hands;Wash/dry face;Standing;Modified independent   Upper Body Bathing: Modified independent;Standing   Lower Body Bathing: Sit to/from stand;Modified independent;Cueing for safety   Upper Body Dressing : Modified independent;Standing   Lower Body Dressing: Sit to/from stand;Modified independent   Toilet Transfer: Grab bars;Modified Independent   Toileting- Clothing Manipulation and Hygiene: Sit to/from stand;Modified independent       Functional mobility during ADLs: Modified independent General ADL  Comments: Modified independent standing level ADLs, cueing for safety awareness/pacing     Vision Baseline Vision/History: Wears glasses Wears Glasses: Reading only Patient Visual Report: No change from baseline Vision Assessment?: No apparent visual deficits     Perception Perception Perception Tested?: Yes Spatial deficits: slight dysmetria in R UE   Praxis Praxis Praxis tested?: Within functional limits    Pertinent Vitals/Pain Pain Assessment: No/denies pain     Hand Dominance Right   Extremity/Trunk Assessment Upper Extremity Assessment Upper Extremity Assessment: Overall WFL for tasks assessed   Lower Extremity Assessment Lower Extremity Assessment: Defer to PT evaluation   Cervical / Trunk Assessment Cervical / Trunk Assessment: Normal   Communication Communication Communication: No difficulties   Cognition Arousal/Alertness: Awake/alert Behavior During Therapy: Flat affect Overall Cognitive Status: Impaired/Different from baseline Area of Impairment: Problem solving                             Problem Solving: Slow processing General Comments: Pt oriented to x3 but demonstrated slow processing and memory deficits, stating conflicting information to that in the EMR   General Comments  Pt plesant during session, worried about d/c plan.. Began (S) and progressed to mod I in session            Home Living Family/patient expects to be discharged to:: Shelter/Homeless Living Arrangements: Alone  Additional Comments: pt unable to live with daughter anymore, homeless      Prior Functioning/Environment Level of Independence: Independent                 OT Problem List: Decreased knowledge of use of DME or AE;Decreased knowledge of precautions;Decreased safety awareness;Decreased cognition;Decreased coordination;Decreased activity tolerance;Impaired balance (sitting and/or standing)      OT  Treatment/Interventions: Self-care/ADL training;Therapeutic exercise;Therapeutic activities;Balance training;Patient/family education    OT Goals(Current goals can be found in the care plan section) Acute Rehab OT Goals Patient Stated Goal: find a place to go OT Goal Formulation: With patient Time For Goal Achievement: 03/28/19 Potential to Achieve Goals: Good  OT Frequency: Min 3X/week   Barriers to D/C: Decreased caregiver support;Inaccessible home environment  pt homeless with little to no family support. Hx of drug/alcohol abuse per chart          AM-PAC OT "6 Clicks" Daily Activity     Outcome Measure Help from another person eating meals?: None Help from another person taking care of personal grooming?: None Help from another person toileting, which includes using toliet, bedpan, or urinal?: None Help from another person bathing (including washing, rinsing, drying)?: None Help from another person to put on and taking off regular upper body clothing?: None Help from another person to put on and taking off regular lower body clothing?: None 6 Click Score: 24   End of Session Equipment Utilized During Treatment: Gait belt Nurse Communication: Mobility status;Other (comment)(Need for CSW to coordinate d/c)  Activity Tolerance: Patient tolerated treatment well Patient left: in bed;with call bell/phone within reach;with bed alarm set  OT Visit Diagnosis: Unsteadiness on feet (R26.81);Muscle weakness (generalized) (M62.81)                Time: 1046-1100 OT Time Calculation (min): 14 min Charges:  OT General Charges $OT Visit: 1 Visit OT Evaluation $OT Eval Low Complexity: 1 Low  Crissie ReeseSandra H Malin Cervini OTR/L  03/23/2019, 11:42 AM
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
## 2019-03-23 NOTE — Progress Notes (Signed)
Discussed discharge instructions including medications and follow up appointments.  Transition pharmacy delivered her first month of medication.  Gave patient bus passes.

## 2019-03-23 NOTE — Discharge Instructions (Signed)
Danielle [name] HAS BEEN SCREENED AND TESTED NEGATIVE FOR COVID-19 ON ADMISSION (03/21/2019)  Dear [name]  You came to [place] with loss of consciousness. We have determined this was caused by dehydration. Here are our recommendations for you at discharge:  Make sure to drink plenty of fluids Start atorvastatin 20mg  daily Make sure to take all of your home medications as prescribed Make sure to follow up with your family doctor  Thank you for choosing [facility].    Dehydration  Dehydration is when there is not enough fluid or water in your body. This happens when you lose more fluids than you take in. People who are age 91 or older have a higher risk of getting dehydrated. Dehydration can range from mild to very bad. It should be treated right away to keep it from getting very bad. Symptoms of mild dehydration may include:  Thirst.  Dry lips.  Slightly dry mouth.  Dry, warm skin.  Dizziness. Symptoms of moderate dehydration may include:  Very dry mouth.  Muscle cramps.  Dark pee (urine). Pee may be the color of tea.  Your body making less pee.  Your eyes making fewer tears.  Heartbeat that is uneven or faster than normal (palpitations).  Headache.  Light-headedness, especially when you stand up from sitting.  Fainting (syncope). Symptoms of very bad dehydration may include:  Changes in skin, such as: ? Cold and clammy skin. ? Blotchy (mottled) or pale skin. ? Skin that does not quickly return to normal after being lightly pinched and let go (poor skin turgor).  Changes in body fluids, such as: ? Feeling very thirsty. ? Your eyes making fewer tears. ? Not sweating when body temperature is high, such as in hot weather. ? Your body making very little pee.  Changes in vital signs, such as: ? Weak pulse. ? Pulse that is more than 100 beats a minute when you are sitting still. ? Fast breathing. ? Low blood pressure.  Other changes, such as: ? Sunken  eyes. ? Cold hands and feet. ? Confusion. ? Lack of energy (lethargy). ? Trouble waking up from sleep. ? Short-term weight loss. ? Unconsciousness. Follow these instructions at home:   If told by your doctor, drink an ORS: ? Make an ORS by using instructions on the package. ? Start by drinking small amounts, about  cup (120 mL) every 5-10 minutes. ? Slowly drink more until you have had the amount that your doctor said to have.  Drink enough clear fluid to keep your pee clear or pale yellow. If you were told to drink an ORS, finish the ORS first, then start slowly drinking clear fluids. Drink fluids such as: ? Water. Do not drink only water by itself. Doing that can make the salt (sodium) level in your body get too low (hyponatremia). ? Ice chips. ? Fruit juice that you have added water to (diluted). ? Low-calorie sports drinks.  Avoid: ? Alcohol. ? Drinks that have a lot of sugar. These include high-calorie sports drinks, fruit juice that does not have water added, and soda. ? Caffeine. ? Foods that are greasy or have a lot of fat or sugar.  Take over-the-counter and prescription medicines only as told by your doctor.  Do not take salt tablets. Doing that can make the salt level in your body get too high (hypernatremia).  Eat foods that have minerals (electrolytes). Examples include bananas, oranges, potatoes, tomatoes, and spinach.  Keep all follow-up visits as told by your doctor.  This is important. Contact a doctor if:  You have belly (abdominal) pain that: ? Gets worse. ? Stays in one area (localizes).  You have a rash.  You have a stiff neck.  You get angry or annoyed more easily than normal (irritability).  You are more sleepy than normal.  You have a harder time waking up than normal.  You feel: ? Weak. ? Dizzy. ? Very thirsty. Get help right away if:  You have symptoms of very bad dehydration.  You cannot drink fluids without throwing up  (vomiting).  Your symptoms get worse with treatment.  You have a fever.  You have a very bad headache.  You are throwing up or having watery poop (diarrhea) and it: ? Gets worse. ? Does not go away.  You have diarrhea for more than 24 hours.  You have blood or something green (bile) in your throw-up.  You have blood in your poop (stool). This may cause poop to look black and tarry.  You have not peed in 6-8 hours.  You have peed (urinated) only a small amount of very dark pee during 6-8 hours.  You pass out (faint).  Your heart rate when you are sitting still is more than 100 beats a minute.  You have trouble breathing. This information is not intended to replace advice given to you by your health care provider. Make sure you discuss any questions you have with your health care provider. Document Released: 09/23/2011 Document Revised: 04/23/2016 Document Reviewed: 11/28/2015 Elsevier Interactive Patient Education  2019 [employer]

## 2019-03-23 NOTE — TOC Progression Note (Addendum)
Transition of Care [facility]) - Progression Note    Patient Details  Name: Danielle [name] MRN: [id number] Date of Birth: 1951-01-05  Transition of Care [facility]) CM/SW Contact  Margarito Liner, LCSW Phone Number: 03/23/2019, 1:42 PM  Clinical Narrative: [facility] is not willing to accept patient back. [employer] is full. [facility] is not accepting new applications until July 1st. Left voicemail at [facility] but information sheet says they take women with children. Left voicemail for Roanna Raider who assists with shelter for vulnerable populations to see if patient would meet criteria.    4:44 pm: RN has 3 bus passes for patient. CSW provided a long-sleeved shirt and light jacket. Discussed case with Carlynn Spry. No options for patient as of right now. Options more limited with substance abuse and mental health history. CSW texted her patient's phone number in case someone can follow up with her from the community. CSW signing off.    Expected Discharge Plan and Services           Expected Discharge Date: 03/23/19                                     Social Determinants of Health (SDOH) Interventions    Readmission Risk Interventions No flowsheet data found.

## 2019-03-26 ENCOUNTER — Other Ambulatory Visit: Payer: Self-pay | Admitting: *Deleted

## 2019-03-26 NOTE — Patient Outreach (Signed)
[facility]) Care Management  03/26/2019  Danielle [name] November 29, 1950 [id number]  Referral received: 03/22/2019 Hospital d/c: 03/23/2019 Initial Outreach 03/26/2019  Transition of care  Unsuccessful with the initial outreach today for recent hospital discharge. RN able to leave a HIPAA approved voice message requesting a call back. Will further assess at that time for [facility] services.  Plan: Will send outreach letter and reschedule another call with in the next 4 days.   [name], RN Care Management Coordinator [place] Office [phone]

## 2019-03-30 ENCOUNTER — Other Ambulatory Visit: Payer: Self-pay | Admitting: *Deleted
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
## 2019-04-01 NOTE — Progress Notes (Signed)
[facility] Screening performed. COVID screening, temperature, PHQ-9, and need for medical care and medications assessed. No additional needs assessed at this time.  Trevia Nop RN MSN 
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
## 2019-04-05 DIAGNOSIS — G2401 Drug induced subacute dyskinesia: Secondary | ICD-10-CM | POA: Diagnosis not present

## 2019-04-05 DIAGNOSIS — Z79899 Other long term (current) drug therapy: Secondary | ICD-10-CM | POA: Diagnosis not present

## 2019-04-05 DIAGNOSIS — F322 Major depressive disorder, single episode, severe without psychotic features: Secondary | ICD-10-CM | POA: Diagnosis not present

## 2019-04-05 DIAGNOSIS — F411 Generalized anxiety disorder: Secondary | ICD-10-CM | POA: Diagnosis not present

## 2019-04-12 NOTE — Progress Notes (Signed)
COVID Hotel Screening performed. Temperature, PHQ-9, and need for medical care and medications assessed. Referred for medication assistance.  Alanni Vader MSN, RN  

## 2019-04-19 NOTE — Progress Notes (Signed)
COVID Hotel Screening performed. Temperature, PHQ-9, and need for medical care and medications assessed. No additional needs assessed at this time.  Shakaria Raphael  MSN, RN 

## 2019-04-26 NOTE — Progress Notes (Signed)
[place] Screening performed. Temperature, PHQ-9, and need for medical care and medication assessment.Jobe Igo MSN, RN

## 2019-04-27 ENCOUNTER — Other Ambulatory Visit: Payer: Self-pay

## 2019-04-27 DIAGNOSIS — Z20822 Contact with and (suspected) exposure to covid-19: Secondary | ICD-10-CM

## 2019-04-28 ENCOUNTER — Emergency Department (HOSPITAL_BASED_OUTPATIENT_CLINIC_OR_DEPARTMENT_OTHER): Payer: Medicare Other

## 2019-04-28 ENCOUNTER — Emergency Department (HOSPITAL_BASED_OUTPATIENT_CLINIC_OR_DEPARTMENT_OTHER)
Admission: EM | Admit: 2019-04-28 | Discharge: 2019-04-28 | Disposition: A | Discharge status: Home / Self Care | Payer: Medicare Other | Attending: Emergency Medicine | Admitting: Emergency Medicine

## 2019-04-28 ENCOUNTER — Other Ambulatory Visit: Payer: Self-pay

## 2019-04-28 ENCOUNTER — Encounter (HOSPITAL_BASED_OUTPATIENT_CLINIC_OR_DEPARTMENT_OTHER): Payer: Self-pay | Admitting: *Deleted

## 2019-04-28 DIAGNOSIS — Z8673 Personal history of transient ischemic attack (TIA), and cerebral infarction without residual deficits: Secondary | ICD-10-CM | POA: Diagnosis not present

## 2019-04-28 DIAGNOSIS — Z7982 Long term (current) use of aspirin: Secondary | ICD-10-CM | POA: Diagnosis not present

## 2019-04-28 DIAGNOSIS — I10 Essential (primary) hypertension: Secondary | ICD-10-CM | POA: Diagnosis not present

## 2019-04-28 DIAGNOSIS — I2510 Atherosclerotic heart disease of native coronary artery without angina pectoris: Secondary | ICD-10-CM | POA: Insufficient documentation

## 2019-04-28 DIAGNOSIS — Z87891 Personal history of nicotine dependence: Secondary | ICD-10-CM | POA: Diagnosis not present

## 2019-04-28 DIAGNOSIS — Z79899 Other long term (current) drug therapy: Secondary | ICD-10-CM | POA: Diagnosis not present

## 2019-04-28 DIAGNOSIS — R079 Chest pain, unspecified: Secondary | ICD-10-CM | POA: Diagnosis not present

## 2019-04-28 DIAGNOSIS — R000 Tachycardia, unspecified: Secondary | ICD-10-CM | POA: Diagnosis not present

## 2019-04-28 DIAGNOSIS — R072 Precordial pain: Secondary | ICD-10-CM | POA: Diagnosis not present

## 2019-04-28 DIAGNOSIS — J45909 Unspecified asthma, uncomplicated: Secondary | ICD-10-CM | POA: Diagnosis not present

## 2019-04-28 DIAGNOSIS — R457 State of emotional shock and stress, unspecified: Secondary | ICD-10-CM | POA: Diagnosis not present

## 2019-04-28 LAB — TROPONIN I (HIGH SENSITIVITY)
Troponin I (High Sensitivity): 8 ng/L (ref <18)
Troponin I (High Sensitivity): 8 ng/L (ref <18)

## 2019-04-28 LAB — COMPREHENSIVE METABOLIC PANEL
ALT: 16 U/L (ref 0–44)
AST: 22 U/L (ref 15–41)
Albumin: 3.9 g/dL (ref 3.5–5.0)
Alkaline Phosphatase: 70 U/L (ref 38–126)
Anion gap: 10 (ref 5–15)
BUN: 20 mg/dL (ref 8–23)
CO2: 21 mmol/L — ABNORMAL LOW (ref 22–32)
Calcium: 9.7 mg/dL (ref 8.9–10.3)
Chloride: 107 mmol/L (ref 98–111)
Creatinine, Ser: 0.79 mg/dL (ref 0.44–1.00)
GFR calc Af Amer: >60 mL/min (ref >60)
GFR calc non Af Amer: >60 mL/min (ref >60)
Glucose, Bld: 99 mg/dL (ref 70–99)
Potassium: 3.9 mmol/L (ref 3.5–5.1)
Sodium: 138 mmol/L (ref 135–145)
Total Bilirubin: 0.6 mg/dL (ref 0.3–1.2)
Total Protein: 7.6 g/dL (ref 6.5–8.1)

## 2019-04-28 LAB — RAPID URINE DRUG SCREEN, HOSP PERFORMED
Amphetamines: NOT DETECTED
Barbiturates: NOT DETECTED
Benzodiazepines: NOT DETECTED
Cocaine: NOT DETECTED
Opiates: NOT DETECTED
Tetrahydrocannabinol: NOT DETECTED

## 2019-04-28 LAB — CBC WITH DIFFERENTIAL/PLATELET
Abs Immature Granulocytes: 0 10*3/uL (ref 0.00–0.07)
Basophils Absolute: 0.1 10*3/uL (ref 0.0–0.1)
Basophils Relative: 1 %
Eosinophils Absolute: 0.2 10*3/uL (ref 0.0–0.5)
Eosinophils Relative: 4 %
HCT: 39.7 % (ref 36.0–46.0)
Hemoglobin: 13.1 g/dL (ref 12.0–15.0)
Immature Granulocytes: 0 %
Lymphocytes Relative: 58 %
Lymphs Abs: 3.4 10*3/uL (ref 0.7–4.0)
MCH: 31.1 pg (ref 26.0–34.0)
MCHC: 33 g/dL (ref 30.0–36.0)
MCV: 94.3 fL (ref 80.0–100.0)
Monocytes Absolute: 0.6 10*3/uL (ref 0.1–1.0)
Monocytes Relative: 10 %
Neutro Abs: 1.6 10*3/uL — ABNORMAL LOW (ref 1.7–7.7)
Neutrophils Relative %: 27 %
Platelets: 252 10*3/uL (ref 150–400)
RBC: 4.21 MIL/uL (ref 3.87–5.11)
RDW: 12 % (ref 11.5–15.5)
WBC: 5.8 10*3/uL (ref 4.0–10.5)
nRBC: 0 % (ref 0.0–0.2)

## 2019-04-28 LAB — URINALYSIS, ROUTINE W REFLEX MICROSCOPIC
Bilirubin Urine: NEGATIVE
Glucose, UA: NEGATIVE mg/dL
Hgb urine dipstick: NEGATIVE
Ketones, ur: NEGATIVE mg/dL
Nitrite: NEGATIVE
Protein, ur: NEGATIVE mg/dL
Specific Gravity, Urine: <1.005 — ABNORMAL LOW (ref 1.005–1.030)
pH: 6.5 (ref 5.0–8.0)

## 2019-04-28 LAB — URINALYSIS, MICROSCOPIC (REFLEX): RBC / HPF: NONE SEEN RBC/hpf (ref 0–5)

## 2019-04-28 LAB — LIPASE, BLOOD: Lipase: 67 U/L — ABNORMAL HIGH (ref 11–51)

## 2019-04-28 MED ORDER — ONDANSETRON HCL 4 MG/2ML IJ SOLN
4.0000 mg | Freq: Once | INTRAMUSCULAR | Status: AC
  Administered 2019-04-28: 4 mg via INTRAVENOUS
  Filled 2019-04-28: qty 2

## 2019-04-28 MED ORDER — KETOROLAC TROMETHAMINE 15 MG/ML IJ SOLN
15.0000 mg | Freq: Once | INTRAMUSCULAR | Status: AC
  Administered 2019-04-28: 15 mg via INTRAVENOUS
  Filled 2019-04-28: qty 1

## 2019-04-28 MED ORDER — LORATADINE 10 MG PO TABS: 10.0000 mg | ORAL_TABLET | Freq: Every day | ORAL | 0 refills | Status: DC

## 2019-04-28 MED ORDER — LORATADINE 10 MG PO TABS
10.0000 mg | ORAL_TABLET | Freq: Once | ORAL | Status: AC
  Administered 2019-04-28: 10 mg via ORAL
  Filled 2019-04-28: qty 1

## 2019-04-28 NOTE — Discharge Instructions (Signed)
Please read and follow all provided instructions.  Your diagnoses today include:  1. Precordial pain     Tests performed today include:  An EKG of your heart  A chest x-ray  Cardiac enzymes - a blood test for heart muscle damage that were normal  Blood counts and electrolytes  Vital signs. See below for your results today.   Medications prescribed:   Claritin - medicine to help with nasal drainage  Take any prescribed medications only as directed.  Follow-up instructions: Please follow-up with your primary care provider as soon as you can for further evaluation of your symptoms.   Return instructions:  SEEK IMMEDIATE MEDICAL ATTENTION IF:  You have severe chest pain, especially if the pain is crushing or pressure-like and spreads to the arms, back, neck, or jaw, or if you have sweating, nausea (feeling sick to your stomach), or shortness of breath. THIS IS AN EMERGENCY. Don't wait to see if the pain will go away. Get medical help at once. Call 911 or [phone] (operator). DO NOT drive yourself to the hospital.   Your chest pain gets worse and does not go away with rest.   You have an attack of chest pain lasting longer than usual, despite rest and treatment with the medications your caregiver has prescribed.   You wake from sleep with chest pain or shortness of breath.  You feel dizzy or faint.  You have chest pain not typical of your usual pain for which you originally saw your caregiver.   You have any other emergent concerns regarding your health.  Additional Information: Chest pain comes from many different causes. Your caregiver has diagnosed you as having chest pain that is not specific for one problem, but does not require admission.  You are at low risk for an acute heart condition or other serious illness.   Your vital signs today were: BP 116/75    Pulse 83    Temp 98.6 F (37 C) (Oral)    Resp (!) 22    Ht 5\' 1"  (1.549 m)    Wt 61.2 kg    SpO2 98%    BMI 25.51  kg/m  If your blood pressure (BP) was elevated above 135/85 this visit, please have this repeated by your doctor within one month. --------------

## 2019-04-28 NOTE — ED Triage Notes (Signed)
Pt reports intermittent chest pain and SOB over the last week- States she feels it across her chest, in her right arm and under her left breast. States she has taken ibuprofen and used her inhaler without relief. Pt brought by ems, EKG performed by the pta

## 2019-04-28 NOTE — ED Provider Notes (Signed)
[facility] EMERGENCY DEPARTMENT Provider Note   CSN: [id number] Arrival date & time: 04/28/19  1308     History   Chief Complaint Chief Complaint  Patient presents with  . Chest Pain    HPI Danielle [name] is a 68 y.o. female.     Patient with history of previous stroke, coronary artery disease (cath 07/2018 with isolated 85% stenosis of the ostial first diagonal artery, otherwise normal coronaries, recommend medical management), tardive dyskinesia, bipolar disorder/schizophrenia, history of polysubstance abuse --presents to the emergency department with complaint of chest pain.  Patient states that she has been having waxing and waning pain in her right upper arm, right shoulder and neck, beneath her left breast, and lower abdominal pain ongoing over the past 3 to 4 days.  The pain comes and goes but has been present for at least the last day.  She has had nausea but no vomiting.  No diaphoresis.  No lower extremity swelling.  She states that she thought she might be constipated and needed a laxative.  No urinary symptoms.  States that she has an appointment with her doctor this upcoming week however she could not wait any longer due to her symptoms.  She denies heavy NSAID use, alcohol use, drug use.  States she has been using ibuprofen and albuterol inhaler without improvement.     Past Medical History:  Diagnosis Date  . Asthma   . Bipolar affective disorder, currently depressed, mild (HCC)   . Chest pain   . Complication of anesthesia    " My eyes swell up "  . Coronary artery disease   . Depression   . Fall 08/25/2012   Recent fall with residual knee and back pain.  . Family history of anesthesia complication    " [name] "  . Hypertension   . Memory disturbance 01/30/2016  . Stroke (HCC)   . Syncope and collapse 03/21/2019  . Tardive dyskinesia   . Tobacco abuse     Patient Active Problem List   Diagnosis Date Noted  . CAD (coronary artery  disease) 03/22/2019  . Syncope 03/21/2019  . Unstable angina (HCC)   . Abnormal nuclear stress test   . Chest pain 08/13/2018  . Hypokalemia 08/11/2018  . Tobacco abuse 08/11/2018  . Chest pain with high risk for cardiac etiology 08/10/2018  . Memory disturbance 01/30/2016  . Acute encephalopathy 05/23/2014  . Altered mental status 12/08/2013  . Schizophrenia, paranoid type (HCC) 04/28/2012  . Cocaine abuse (HCC) 04/28/2012  . Cannabis abuse 04/28/2012  . Substance induced mood disorder (HCC) 04/28/2012  . Noncompliance 09/16/2011  . Anxiety 09/16/2011  . Constipation 09/16/2011  . Chest pain 09/14/2011  . SUBSTANCE ABUSE, MULTIPLE 12/03/2008  . HYPERLIPIDEMIA 08/01/2008  . MIGRAINE HEADACHE 08/01/2008  . ALLERGIC RHINITIS WITH CONJUNCTIVITIS 08/01/2008  . ASTHMA 08/01/2008  . DEPRESSION 06/20/2008  . URI 06/20/2008  . ANXIETY DISORDER, GENERALIZED 05/28/2008  . HYPERTENSION, BENIGN ESSENTIAL 05/28/2008  . GERD 05/28/2008  . BACTERIAL VAGINITIS 05/24/2008  . DEEP VENOUS THROMBOPHLEBITIS, LEFT, LEG, HX OF 10/18/1988    Past Surgical History:  Procedure Laterality Date  . ABDOMINAL HYSTERECTOMY    . CARDIAC CATHETERIZATION    . RIGHT/LEFT HEART CATH AND CORONARY ANGIOGRAPHY N/A 08/14/2018   Procedure: RIGHT/LEFT HEART CATH AND CORONARY ANGIOGRAPHY;  Surgeon: Marykay LexHarding, David W, MD;  [facility];  Service: Cardiovascular;  Laterality: N/A;     OB History   No obstetric history on file.  Home Medications    Prior to Admission medications   Medication Sig Start Date End Date Taking? Authorizing Provider  albuterol (PROVENTIL HFA;VENTOLIN HFA) 108 (90 Base) MCG/ACT inhaler Inhale 1-2 puffs into the lungs every 6 (six) hours as needed for wheezing or shortness of breath (or cough). 12/31/18   Arlyn DunningMcLean, Kelly A, PA-C  amLODipine (NORVASC) 2.5 MG tablet Take 1 tablet (2.5 mg total) by mouth daily. 03/23/19   Theotis BarrioLee, Marcelline Temkin K, MD  aspirin 81 MG chewable tablet  Chew 1 tablet (81 mg total) by mouth daily. 03/23/19   Theotis BarrioLee, Megan Presti K, MD  atorvastatin (LIPITOR) 20 MG tablet Take 1 tablet (20 mg total) by mouth daily at 6 PM. 03/24/19   Theotis BarrioLee, Krishon Adkison K, MD  carvedilol (COREG) 6.25 MG tablet Take 1 tablet (6.25 mg total) by mouth 2 (two) times daily with a meal. 03/23/19   Theotis BarrioLee, Wilson Sample K, MD  folic acid (FOLVITE) 1 MG tablet Take 1 tablet (1 mg total) by mouth daily. Patient not taking: Reported on 03/21/2019 08/15/18   Kathlen ModyAkula, Vijaya, MD  Multiple Vitamin (MULTIVITAMIN WITH MINERALS) TABS tablet Take 1 tablet by mouth daily. Patient not taking: Reported on 03/21/2019 08/15/18   Kathlen ModyAkula, Vijaya, MD  nicotine (NICODERM CQ - DOSED IN MG/24 HOURS) 14 mg/24hr patch Place 1 patch (14 mg total) onto the skin daily. Patient not taking: Reported on 03/21/2019 08/15/18   Kathlen ModyAkula, Vijaya, MD  QUEtiapine (SEROQUEL) 100 MG tablet Take 1 tablet (100 mg total) by mouth daily. 03/23/19   Theotis BarrioLee, Kirsty Monjaraz K, MD  sertraline (ZOLOFT) 50 MG tablet Take 1 tablet (50 mg total) by mouth daily. 03/23/19   Theotis BarrioLee, Ayelet Gruenewald K, MD  thiamine 100 MG tablet Take 1 tablet (100 mg total) by mouth daily. Patient not taking: Reported on 03/21/2019 08/15/18   Kathlen ModyAkula, Vijaya, MD    Family History Family History  Problem Relation Age of Onset  . Hypertension Mother   . Stroke Mother   . Heart attack Mother   . Heart attack Father   . Heart attack Brother   . Heart attack Sister   . Dementia Neg Hx     Social History Social History   Tobacco Use  . Smoking status: Former Smoker    Packs/day: 1.00    Types: Cigarettes    Quit date: 08/21/2018    Years since quitting: 0.6  . Smokeless tobacco: Never Used  Substance Use Topics  . Alcohol use: Not Currently    Alcohol/week: 0.0 standard drinks    Comment: About 1 qt per week  . Drug use: Not Currently    Types: Cocaine, Marijuana    Comment: Former user     Allergies   Clonazepam, Doxycycline, Fosinopril sodium, Hydrochlorothiazide w-triamterene, Penicillins,  and Sulfa antibiotics   Review of Systems Review of Systems  Constitutional: Negative for diaphoresis and fever.  Eyes: Negative for redness.  Respiratory: Positive for shortness of breath. Negative for cough.   Cardiovascular: Positive for chest pain. Negative for palpitations and leg swelling.  Gastrointestinal: Positive for abdominal pain and constipation. Negative for nausea and vomiting.  Genitourinary: Negative for dysuria.  Musculoskeletal: Positive for myalgias. Negative for back pain and neck pain.  Skin: Negative for rash.  Neurological: Negative for syncope and light-headedness.  Psychiatric/Behavioral: The patient is not nervous/anxious.      Physical Exam Updated Vital Signs BP (!) 119/95   Pulse 85   Temp 98.6 F (37 C) (Oral)   Resp (!) 22   Ht 5'  1" (1.549 m)   Wt 61.2 kg   SpO2 99%   BMI 25.51 kg/m   Physical Exam Vitals signs and nursing note reviewed.  Constitutional:      Appearance: She is well-developed. She is not diaphoretic.  HENT:     Head: Normocephalic and atraumatic.     Mouth/Throat:     Mouth: Mucous membranes are not dry.  Eyes:     Conjunctiva/sclera: Conjunctivae normal.  Neck:     Musculoskeletal: Normal range of motion and neck supple. No muscular tenderness.     Vascular: Normal carotid pulses. No carotid bruit or JVD.     Trachea: Trachea normal. No tracheal deviation.  Cardiovascular:     Rate and Rhythm: Normal rate and regular rhythm.     Pulses: No decreased pulses.          Radial pulses are 2+ on the right side and 2+ on the left side.     Heart sounds: Normal heart sounds, S1 normal and S2 normal. No murmur.  Pulmonary:     Effort: Pulmonary effort is normal. No respiratory distress.     Breath sounds: No wheezing.  Chest:     Chest wall: Tenderness present.     Comments: Patient notes that her pain is made worse when I push over her upper chest and sternal area.  She winces and withdrawals in pain when I do this.  Abdominal:     General: Bowel sounds are normal.     Palpations: Abdomen is soft.     Tenderness: There is no abdominal tenderness. There is no guarding or rebound.  Musculoskeletal: Normal range of motion.  Skin:    General: Skin is warm and dry.     Coloration: Skin is not pale.  Neurological:     Mental Status: She is alert.      ED Treatments / Results  Labs (all labs ordered are listed, but only abnormal results are displayed) Labs Reviewed  CBC WITH DIFFERENTIAL/PLATELET - Abnormal; Notable for the following components:      Result Value   Neutro Abs 1.6 (*)    All other components within normal limits  COMPREHENSIVE METABOLIC PANEL - Abnormal; Notable for the following components:   CO2 21 (*)    All other components within normal limits  LIPASE, BLOOD - Abnormal; Notable for the following components:   Lipase 67 (*)    All other components within normal limits  URINALYSIS, ROUTINE W REFLEX MICROSCOPIC - Abnormal; Notable for the following components:   Color, Urine STRAW (*)    Specific Gravity, Urine <1.005 (*)    Leukocytes,Ua SMALL (*)    All other components within normal limits  URINALYSIS, MICROSCOPIC (REFLEX) - Abnormal; Notable for the following components:   Bacteria, UA FEW (*)    All other components within normal limits  RAPID URINE DRUG SCREEN, HOSP PERFORMED  TROPONIN I (HIGH SENSITIVITY)  TROPONIN I (HIGH SENSITIVITY)    EKG EKG Interpretation  Date/Time:  Saturday April 28 2019 13:19:10 EDT Ventricular Rate:  98 PR Interval:    QRS Duration: 92 QT Interval:  390 QTC Calculation: 498 R Axis:   61 Text Interpretation:  Sinus rhythm Probable left atrial enlargement Borderline T wave abnormalities No STEMI. Similar to prior tracing.  Confirmed by Alona BeneLong, Tari Lecount [phone]) on 04/28/2019 1:23:08 PM   Radiology Dg Chest 2 View  Result Date: 04/28/2019 CLINICAL DATA:  Chest pain. EXAM: CHEST - 2 VIEW COMPARISON:  March 21, 2019  FINDINGS: The  cardiomediastinal silhouette is stable. Minimal opacity in the lateral left mid and lower lung suspected represent atelectasis with a platelike appearance. No other acute abnormalities. IMPRESSION: Minimal opacity in the left mid lower lung suspected represent atelectasis. No other changes. Electronically Signed   By: Dorise Bullion III M.D   On: 04/28/2019 13:44    Procedures Procedures (including critical care time)  Medications Ordered in ED Medications  loratadine (CLARITIN) tablet 10 mg (has no administration in time range)  ketorolac (TORADOL) 15 MG/ML injection 15 mg (15 mg Intravenous Given 04/28/19 1540)  ondansetron (ZOFRAN) injection 4 mg (4 mg Intravenous Given 04/28/19 1540)     Initial Impression / Assessment and Plan / ED Course  I have reviewed the triage vital signs and the nursing notes.  Pertinent labs & imaging results that were available during my care of the patient were reviewed by me and considered in my medical decision making (see chart for details).        Patient seen and examined.  Reviewed patient history.  EKG reassuring.  Labs ordered.    Vital signs reviewed and are as follows: BP (!) 119/95   Pulse 85   Temp 98.6 F (37 C) (Oral)   Resp (!) 22   Ht 5\' 1"  (1.549 m)   Wt 61.2 kg   SpO2 99%   BMI 25.51 kg/m   First troponin was normal but greater than 4 therefore will check delta troponin.  4:36 PM Second marker unchanged.  Patient updated on results.  Comfortable with discharge to home.  Urged PCP follow-up this week.     Patient is asking why she continues to continually clear her throat.  She is requesting some medication for this.  Will give empiric Claritin in case she has a postnasal drip cause.  May need to consider GI etiology if it persists.  Will defer this to her primary care doctor.  Patient was counseled to return with severe chest pain, especially if the pain is crushing or pressure-like and spreads to the arms, back, neck, or  jaw, or if they have sweating, nausea, or shortness of breath with the pain. They were encouraged to call 911 with these symptoms.   The patient verbalized understanding and agreed.    Final Clinical Impressions(s) / ED Diagnoses   Final diagnoses:  Precordial pain   Patient with various chest pains and lower abdominal pain.  Lab work-up today is reassuring.  Lipase is chronically mildly elevated and she has not had any vomiting or pain consistent with pancreatitis.  Troponin negative x2.  EKG is nonischemic and unchanged.  Patient does not have any shortness of breath, tachycardia, or hypoxia to suggest PE.  No clinical signs and symptoms of DVT.  She is tender with palpation over the chest wall.  Previous cath with only 1 small area of occlusion which is being managed medically.  I do not have a high suspicion for angina today.  Comfortable with discharged home at this time.  She is to follow-up with her doctor next week as planned.  ED Discharge Orders         Ordered    loratadine (CLARITIN) 10 MG tablet  Daily     04/28/19 1635           Carlisle Cater, Hershal Coria 04/28/19 1639    Long, Wonda Olds, MD 04/28/19 2010

## 2019-04-28 NOTE — ED Notes (Signed)
ED Provider at bedside. 

## 2019-05-02 LAB — NOVEL CORONAVIRUS, NAA: SARS-CoV-2, NAA: NOT DETECTED

## 2019-05-03 DIAGNOSIS — F411 Generalized anxiety disorder: Secondary | ICD-10-CM | POA: Diagnosis not present

## 2019-05-03 DIAGNOSIS — G2401 Drug induced subacute dyskinesia: Secondary | ICD-10-CM | POA: Diagnosis not present

## 2019-05-03 DIAGNOSIS — F322 Major depressive disorder, single episode, severe without psychotic features: Secondary | ICD-10-CM | POA: Diagnosis not present

## 2019-05-30 DIAGNOSIS — M129 Arthropathy, unspecified: Secondary | ICD-10-CM | POA: Diagnosis not present

## 2019-05-30 DIAGNOSIS — E559 Vitamin D deficiency, unspecified: Secondary | ICD-10-CM | POA: Diagnosis not present

## 2019-05-30 DIAGNOSIS — I10 Essential (primary) hypertension: Secondary | ICD-10-CM | POA: Diagnosis not present

## 2019-05-30 DIAGNOSIS — M545 Low back pain: Secondary | ICD-10-CM | POA: Diagnosis not present

## 2019-05-30 DIAGNOSIS — Z79899 Other long term (current) drug therapy: Secondary | ICD-10-CM | POA: Diagnosis not present

## 2019-05-30 DIAGNOSIS — J449 Chronic obstructive pulmonary disease, unspecified: Secondary | ICD-10-CM | POA: Diagnosis not present

## 2019-05-30 DIAGNOSIS — Z1159 Encounter for screening for other viral diseases: Secondary | ICD-10-CM | POA: Diagnosis not present

## 2019-06-01 DIAGNOSIS — M79675 Pain in left toe(s): Secondary | ICD-10-CM | POA: Diagnosis not present

## 2019-06-01 DIAGNOSIS — M79674 Pain in right toe(s): Secondary | ICD-10-CM | POA: Diagnosis not present

## 2019-06-01 DIAGNOSIS — B351 Tinea unguium: Secondary | ICD-10-CM | POA: Diagnosis not present

## 2019-06-04 DIAGNOSIS — F411 Generalized anxiety disorder: Secondary | ICD-10-CM | POA: Diagnosis not present

## 2019-06-04 DIAGNOSIS — G2401 Drug induced subacute dyskinesia: Secondary | ICD-10-CM | POA: Diagnosis not present

## 2019-06-04 DIAGNOSIS — F322 Major depressive disorder, single episode, severe without psychotic features: Secondary | ICD-10-CM | POA: Diagnosis not present

## 2019-06-15 NOTE — Progress Notes (Signed)
COVID-19 Screening performed. Temperature, PHQ-9, and need for medical care and medications assessed. No additional needs assessed at this time.  Christiaan Strebeck MSN, RN 

## 2019-06-18 NOTE — Progress Notes (Signed)
COVID-19 Screening performed. Temperature, PHQ-9, and need for medical care and medications assessed. No additional needs assessed at this time.  Jaeshawn Silvio MSN, RN 

## 2019-07-10 DIAGNOSIS — F411 Generalized anxiety disorder: Secondary | ICD-10-CM | POA: Diagnosis not present

## 2019-07-10 DIAGNOSIS — G2401 Drug induced subacute dyskinesia: Secondary | ICD-10-CM | POA: Diagnosis not present

## 2019-07-10 DIAGNOSIS — F322 Major depressive disorder, single episode, severe without psychotic features: Secondary | ICD-10-CM | POA: Diagnosis not present

## 2019-07-13 DIAGNOSIS — Z79899 Other long term (current) drug therapy: Secondary | ICD-10-CM | POA: Diagnosis not present

## 2019-08-04 IMAGING — CR CHEST - 2 VIEW
2 series · 2 of 2 positions shown · non-contrast
Comparison: March 21, 2019

CLINICAL DATA: Chest pain.

EXAM:
CHEST - 2 VIEW

[w chest pa]
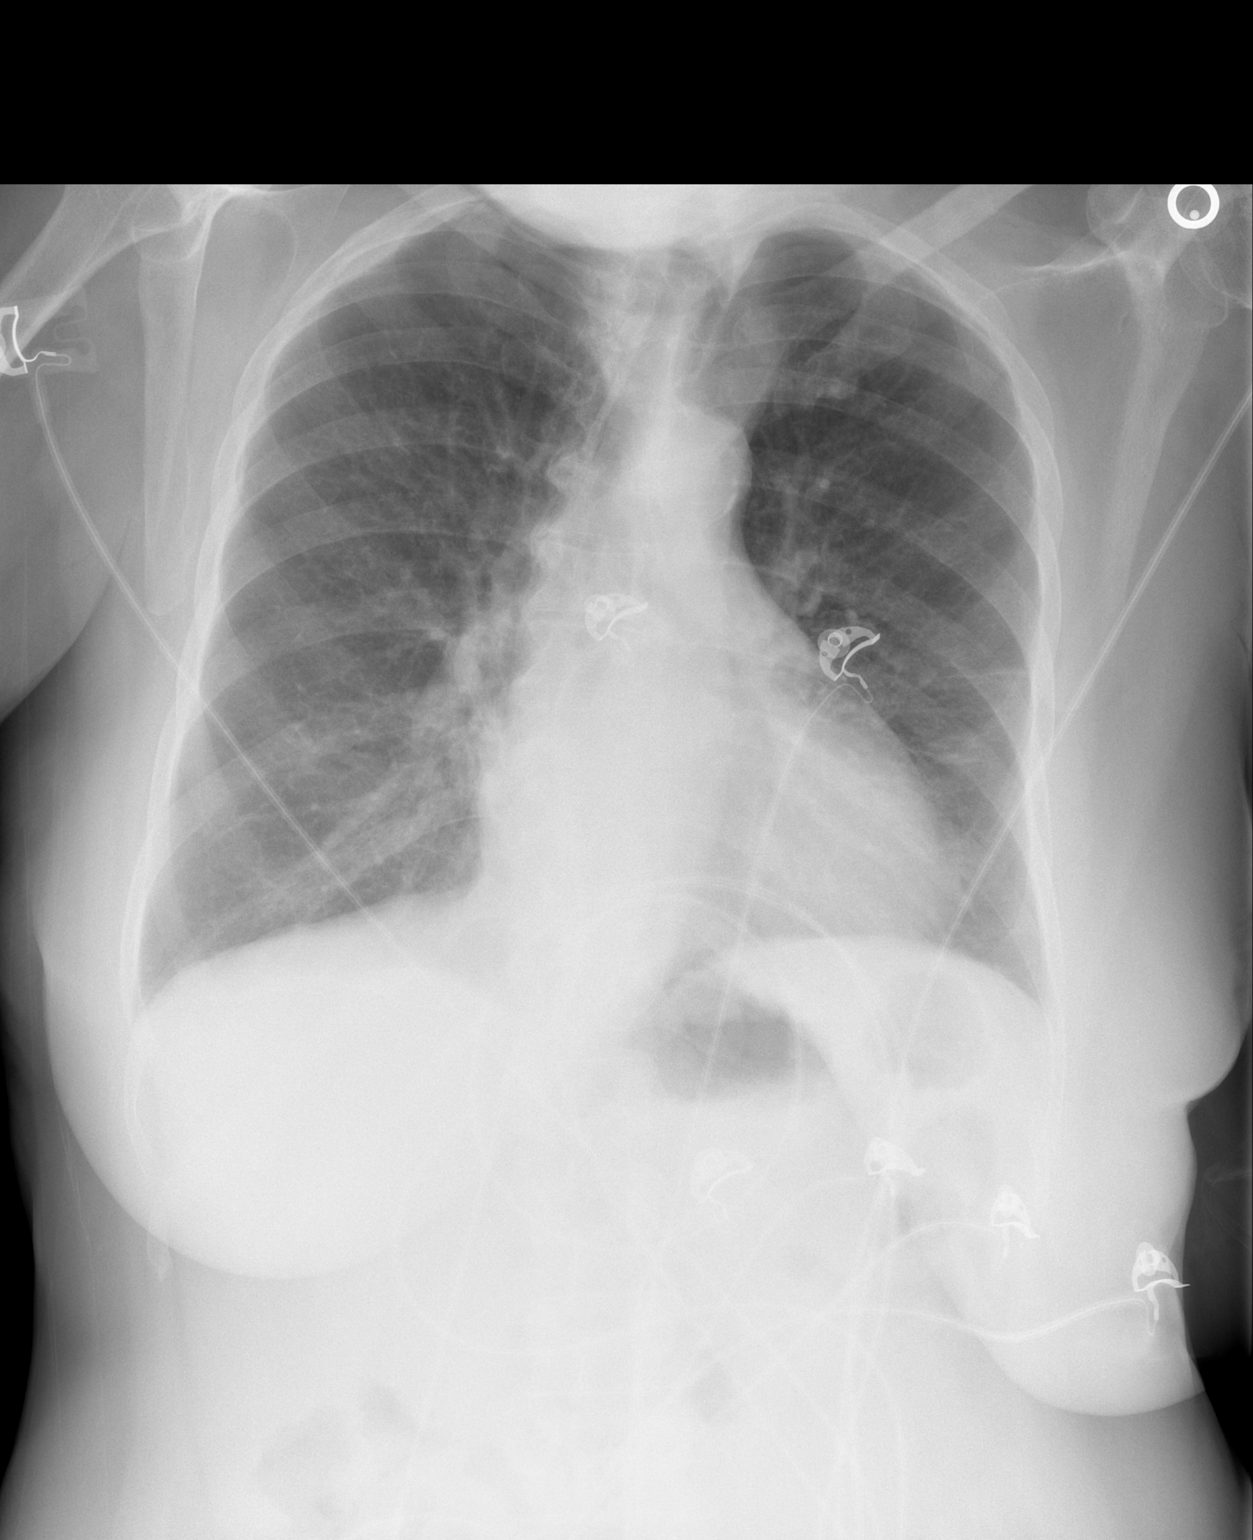

[w chest lat]
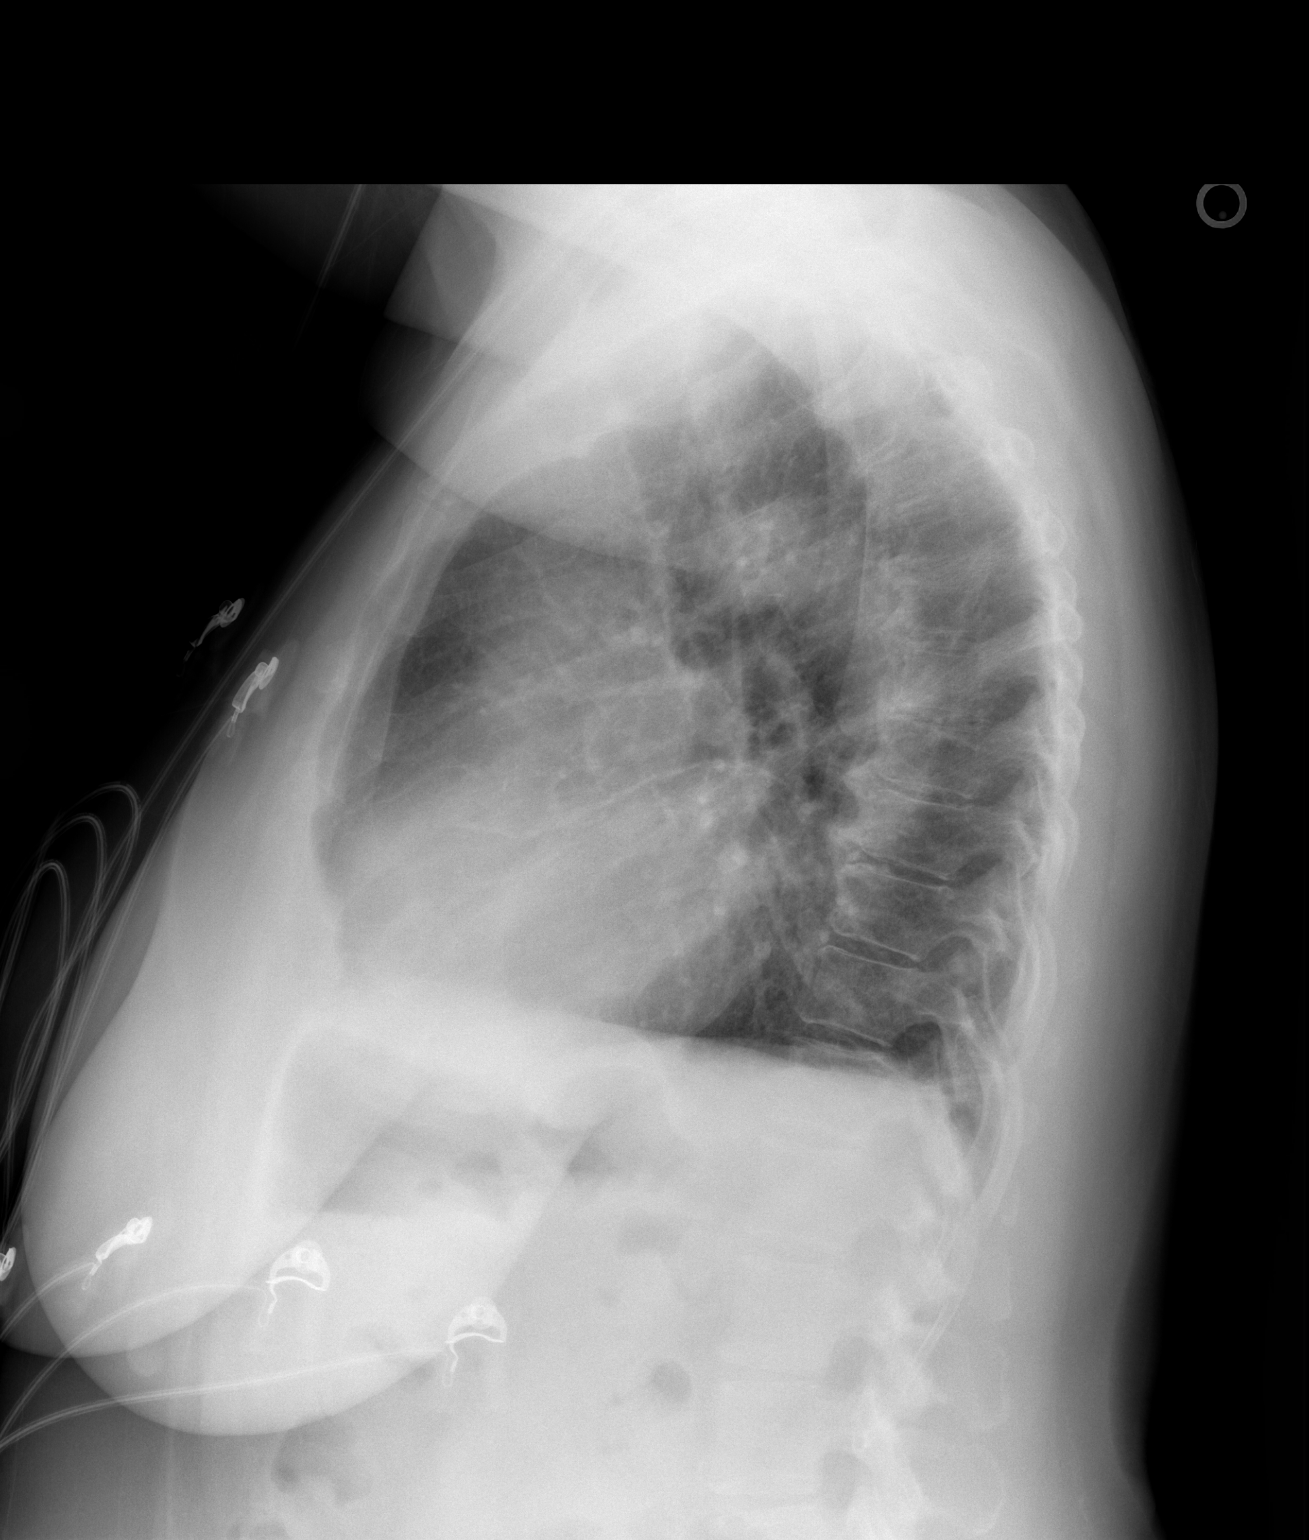

[2 of 2 positions shown; findings below may reference images not displayed]

FINDINGS: The cardiomediastinal silhouette is stable. Minimal opacity in the
lateral left mid and lower lung suspected represent atelectasis with
a platelike appearance. No other acute abnormalities.
IMPRESSION: Minimal opacity in the left mid lower lung suspected represent
atelectasis. No other changes.

## 2019-08-07 DIAGNOSIS — Z008 Encounter for other general examination: Secondary | ICD-10-CM | POA: Diagnosis not present

## 2019-08-07 DIAGNOSIS — R42 Dizziness and giddiness: Secondary | ICD-10-CM | POA: Diagnosis not present

## 2019-08-07 DIAGNOSIS — F250 Schizoaffective disorder, bipolar type: Secondary | ICD-10-CM | POA: Diagnosis not present

## 2019-08-07 DIAGNOSIS — I10 Essential (primary) hypertension: Secondary | ICD-10-CM | POA: Diagnosis not present

## 2019-08-07 DIAGNOSIS — J45909 Unspecified asthma, uncomplicated: Secondary | ICD-10-CM | POA: Diagnosis not present

## 2019-08-07 DIAGNOSIS — Z6826 Body mass index (BMI) 26.0-26.9, adult: Secondary | ICD-10-CM | POA: Diagnosis not present

## 2019-08-07 DIAGNOSIS — R2681 Unsteadiness on feet: Secondary | ICD-10-CM | POA: Diagnosis not present

## 2019-08-07 DIAGNOSIS — E785 Hyperlipidemia, unspecified: Secondary | ICD-10-CM | POA: Diagnosis not present

## 2019-08-07 DIAGNOSIS — E663 Overweight: Secondary | ICD-10-CM | POA: Diagnosis not present

## 2019-08-07 DIAGNOSIS — M542 Cervicalgia: Secondary | ICD-10-CM | POA: Diagnosis not present

## 2019-08-07 DIAGNOSIS — G629 Polyneuropathy, unspecified: Secondary | ICD-10-CM | POA: Diagnosis not present

## 2020-12-29 ENCOUNTER — Emergency Department (HOSPITAL_COMMUNITY): Payer: Medicare Other

## 2020-12-29 ENCOUNTER — Encounter (HOSPITAL_COMMUNITY): Payer: Self-pay | Admitting: Family Medicine

## 2020-12-29 ENCOUNTER — Inpatient Hospital Stay (HOSPITAL_COMMUNITY)
Admission: EM | Admit: 2020-12-29 | Discharge: 2020-12-31 | DRG: 918 | Disposition: A | Discharge status: Nursing Home (Includes ICF) | Payer: Medicare Other | Attending: Internal Medicine | Admitting: Internal Medicine

## 2020-12-29 DIAGNOSIS — E876 Hypokalemia: Secondary | ICD-10-CM | POA: Diagnosis present

## 2020-12-29 DIAGNOSIS — F3131 Bipolar disorder, current episode depressed, mild: Secondary | ICD-10-CM | POA: Diagnosis present

## 2020-12-29 DIAGNOSIS — F411 Generalized anxiety disorder: Secondary | ICD-10-CM | POA: Diagnosis present

## 2020-12-29 DIAGNOSIS — F331 Major depressive disorder, recurrent, moderate: Secondary | ICD-10-CM

## 2020-12-29 DIAGNOSIS — J45909 Unspecified asthma, uncomplicated: Secondary | ICD-10-CM | POA: Diagnosis present

## 2020-12-29 DIAGNOSIS — G2401 Drug induced subacute dyskinesia: Secondary | ICD-10-CM | POA: Diagnosis present

## 2020-12-29 DIAGNOSIS — N179 Acute kidney failure, unspecified: Secondary | ICD-10-CM | POA: Diagnosis not present

## 2020-12-29 DIAGNOSIS — R4701 Aphasia: Secondary | ICD-10-CM | POA: Diagnosis present

## 2020-12-29 DIAGNOSIS — Z20822 Contact with and (suspected) exposure to covid-19: Secondary | ICD-10-CM | POA: Diagnosis present

## 2020-12-29 DIAGNOSIS — R000 Tachycardia, unspecified: Secondary | ICD-10-CM | POA: Diagnosis present

## 2020-12-29 DIAGNOSIS — F1210 Cannabis abuse, uncomplicated: Secondary | ICD-10-CM | POA: Diagnosis present

## 2020-12-29 DIAGNOSIS — W19XXXA Unspecified fall, initial encounter: Secondary | ICD-10-CM | POA: Diagnosis present

## 2020-12-29 DIAGNOSIS — F1410 Cocaine abuse, uncomplicated: Secondary | ICD-10-CM | POA: Diagnosis present

## 2020-12-29 DIAGNOSIS — R471 Dysarthria and anarthria: Secondary | ICD-10-CM

## 2020-12-29 DIAGNOSIS — I2510 Atherosclerotic heart disease of native coronary artery without angina pectoris: Secondary | ICD-10-CM | POA: Diagnosis present

## 2020-12-29 DIAGNOSIS — I69322 Dysarthria following cerebral infarction: Secondary | ICD-10-CM

## 2020-12-29 DIAGNOSIS — Z7982 Long term (current) use of aspirin: Secondary | ICD-10-CM

## 2020-12-29 DIAGNOSIS — E785 Hyperlipidemia, unspecified: Secondary | ICD-10-CM | POA: Diagnosis present

## 2020-12-29 DIAGNOSIS — E860 Dehydration: Secondary | ICD-10-CM | POA: Diagnosis present

## 2020-12-29 DIAGNOSIS — F200 Paranoid schizophrenia: Secondary | ICD-10-CM | POA: Diagnosis present

## 2020-12-29 DIAGNOSIS — T39092A Poisoning by salicylates, intentional self-harm, initial encounter: Principal | ICD-10-CM | POA: Diagnosis present

## 2020-12-29 DIAGNOSIS — Z8249 Family history of ischemic heart disease and other diseases of the circulatory system: Secondary | ICD-10-CM

## 2020-12-29 DIAGNOSIS — T39094A Poisoning by salicylates, undetermined, initial encounter: Secondary | ICD-10-CM

## 2020-12-29 DIAGNOSIS — Z79899 Other long term (current) drug therapy: Secondary | ICD-10-CM

## 2020-12-29 DIAGNOSIS — T39091A Poisoning by salicylates, accidental (unintentional), initial encounter: Secondary | ICD-10-CM

## 2020-12-29 DIAGNOSIS — I10 Essential (primary) hypertension: Secondary | ICD-10-CM | POA: Diagnosis present

## 2020-12-29 DIAGNOSIS — Z9114 Patient's other noncompliance with medication regimen: Secondary | ICD-10-CM

## 2020-12-29 DIAGNOSIS — Z823 Family history of stroke: Secondary | ICD-10-CM

## 2020-12-29 LAB — CBC WITH DIFFERENTIAL/PLATELET
Abs Immature Granulocytes: 0.03 10*3/uL (ref 0.00–0.07)
Basophils Absolute: 0 10*3/uL (ref 0.0–0.1)
Basophils Relative: 1 %
Eosinophils Absolute: 0 10*3/uL (ref 0.0–0.5)
Eosinophils Relative: 0 %
HCT: 40.8 % (ref 36.0–46.0)
Hemoglobin: 13.7 g/dL (ref 12.0–15.0)
Immature Granulocytes: 0 %
Lymphocytes Relative: 39 %
Lymphs Abs: 3 10*3/uL (ref 0.7–4.0)
MCH: 29.9 pg (ref 26.0–34.0)
MCHC: 33.6 g/dL (ref 30.0–36.0)
MCV: 89.1 fL (ref 80.0–100.0)
Monocytes Absolute: 0.7 10*3/uL (ref 0.1–1.0)
Monocytes Relative: 10 %
Neutro Abs: 3.8 10*3/uL (ref 1.7–7.7)
Neutrophils Relative %: 50 %
Platelets: 317 10*3/uL (ref 150–400)
RBC: 4.58 MIL/uL (ref 3.87–5.11)
RDW: 14.3 % (ref 11.5–15.5)
WBC: 7.7 10*3/uL (ref 4.0–10.5)
nRBC: 0 % (ref 0.0–0.2)

## 2020-12-29 LAB — COMPREHENSIVE METABOLIC PANEL
ALT: 18 U/L (ref 0–44)
AST: 41 U/L (ref 15–41)
Albumin: 3.7 g/dL (ref 3.5–5.0)
Alkaline Phosphatase: 61 U/L (ref 38–126)
Anion gap: 15 (ref 5–15)
BUN: 32 mg/dL — ABNORMAL HIGH (ref 8–23)
CO2: 17 mmol/L — ABNORMAL LOW (ref 22–32)
Calcium: 8.8 mg/dL — ABNORMAL LOW (ref 8.9–10.3)
Chloride: 104 mmol/L (ref 98–111)
Creatinine, Ser: 1.47 mg/dL — ABNORMAL HIGH (ref 0.44–1.00)
GFR, Estimated: 39 mL/min — ABNORMAL LOW (ref >60)
Glucose, Bld: 108 mg/dL — ABNORMAL HIGH (ref 70–99)
Potassium: 3.5 mmol/L (ref 3.5–5.1)
Sodium: 136 mmol/L (ref 135–145)
Total Bilirubin: 0.8 mg/dL (ref 0.3–1.2)
Total Protein: 7 g/dL (ref 6.5–8.1)

## 2020-12-29 LAB — I-STAT ARTERIAL BLOOD GAS, ED
Acid-base deficit: 3 mmol/L — ABNORMAL HIGH (ref 0.0–2.0)
Bicarbonate: 19.4 mmol/L — ABNORMAL LOW (ref 20.0–28.0)
Calcium, Ion: 1.02 mmol/L — ABNORMAL LOW (ref 1.15–1.40)
HCT: 42 % (ref 36.0–46.0)
Hemoglobin: 14.3 g/dL (ref 12.0–15.0)
O2 Saturation: 96 %
Patient temperature: 98.6
Potassium: 3.1 mmol/L — ABNORMAL LOW (ref 3.5–5.1)
Sodium: 139 mmol/L (ref 135–145)
TCO2: 20 mmol/L — ABNORMAL LOW (ref 22–32)
pCO2 arterial: 28.2 mmHg — ABNORMAL LOW (ref 32.0–48.0)
pH, Arterial: 7.446 (ref 7.350–7.450)
pO2, Arterial: 79 mmHg — ABNORMAL LOW (ref 83.0–108.0)

## 2020-12-29 LAB — URINALYSIS, ROUTINE W REFLEX MICROSCOPIC
Bilirubin Urine: NEGATIVE
Glucose, UA: NEGATIVE mg/dL
Hgb urine dipstick: NEGATIVE
Ketones, ur: NEGATIVE mg/dL
Nitrite: NEGATIVE
Protein, ur: 30 mg/dL — AB
Specific Gravity, Urine: 1.027 (ref 1.005–1.030)
WBC, UA: >50 WBC/hpf — ABNORMAL HIGH (ref 0–5)
pH: 5 (ref 5.0–8.0)

## 2020-12-29 LAB — RESP PANEL BY RT-PCR (FLU A&B, COVID) ARPGX2
Influenza A by PCR: NEGATIVE
Influenza B by PCR: NEGATIVE
SARS Coronavirus 2 by RT PCR: NEGATIVE

## 2020-12-29 LAB — ETHANOL: Alcohol, Ethyl (B): <10 mg/dL (ref <10)

## 2020-12-29 LAB — MAGNESIUM: Magnesium: 2 mg/dL (ref 1.7–2.4)

## 2020-12-29 LAB — RAPID URINE DRUG SCREEN, HOSP PERFORMED
Amphetamines: NOT DETECTED
Barbiturates: NOT DETECTED
Benzodiazepines: NOT DETECTED
Cocaine: NOT DETECTED
Opiates: NOT DETECTED
Tetrahydrocannabinol: NOT DETECTED

## 2020-12-29 LAB — PROTIME-INR
INR: 1.4 — ABNORMAL HIGH (ref 0.8–1.2)
Prothrombin Time: 16.3 seconds — ABNORMAL HIGH (ref 11.4–15.2)

## 2020-12-29 LAB — ACETAMINOPHEN LEVEL: Acetaminophen (Tylenol), Serum: <10 ug/mL — ABNORMAL LOW (ref 10–30)

## 2020-12-29 LAB — SALICYLATE LEVEL: Salicylate Lvl: 51 mg/dL (ref 7.0–30.0)

## 2020-12-29 MED ORDER — ACETAMINOPHEN 325 MG PO TABS: 650.0000 mg | ORAL_TABLET | Freq: Once | ORAL | Status: DC

## 2020-12-29 MED ORDER — SODIUM BICARBONATE 8.4 % IV SOLN
INTRAVENOUS | Status: DC
  Filled 2020-12-29 (×2): qty 100

## 2020-12-29 MED ORDER — LACTATED RINGERS IV BOLUS
1000.0000 mL | Freq: Once | INTRAVENOUS | Status: AC
  Administered 2020-12-29: 1000 mL via INTRAVENOUS

## 2020-12-29 MED ORDER — MAGNESIUM SULFATE IN D5W 1-5 GM/100ML-% IV SOLN
1.0000 g | Freq: Once | INTRAVENOUS | Status: AC
  Administered 2020-12-30: 1 g via INTRAVENOUS
  Filled 2020-12-29: qty 100

## 2020-12-29 MED ORDER — POTASSIUM CHLORIDE 10 MEQ/100ML IV SOLN
10.0000 meq | Freq: Once | INTRAVENOUS | Status: AC
  Administered 2020-12-29: 10 meq via INTRAVENOUS
  Filled 2020-12-29: qty 100

## 2020-12-29 MED ORDER — POTASSIUM CHLORIDE CRYS ER 20 MEQ PO TBCR
40.0000 meq | EXTENDED_RELEASE_TABLET | Freq: Once | ORAL | Status: AC
  Administered 2020-12-29: 40 meq via ORAL
  Filled 2020-12-29: qty 2

## 2020-12-29 NOTE — ED Triage Notes (Signed)
P BIB [facility] from [facility] [place] for slurred speech.  NO other neuro deficits noted by EMS.  Facility reports she fell sometime last night.  NO obvious injuries, no blood thinners.    PT is A&O with schizophrenia according to EMS. EMS reports facility suspects pt may have had some tylenol PM or BC powder that she got form "the [employer]".  4 Zofran given in route

## 2020-12-29 NOTE — ED Provider Notes (Signed)
[facility] EMERGENCY DEPARTMENT Provider Note   CSN: [id number] Arrival date & time: 12/29/20  1636     History Chief Complaint  Patient presents with  . Aphasia    Danielle [name] is a 70 y.o. female.  HPI Patient is a 70 year old female with a history of polysubstance abuse, memory disturbance, bipolar affective disorder, schizophrenia, unstable angina, syncope, who presents the emergency department due to slurred speech.  Patient resided in [place]. Per EMS, they were called out due to slurred speech.  They were told by the facility staff that she did have a fall yesterday evening but EMS cannot provide any further details.  She typically speaks clearly, per staff at her facility.  They felt that her speech was more slurred than normal today.  Patient is currently oriented to self and place but otherwise cannot tell me the year or current president.  She appears fatigued.  EMS staff notes possible concern for Tylenol PM ingestion.  Unsure of the amount or time.  Patient moving all 4 extremities.  Following commands when asked.  Level 5 caveat due to altered mental status    Past Medical History:  Diagnosis Date  . Asthma   . Bipolar affective disorder, currently depressed, mild (HCC)   . Chest pain   . Complication of anesthesia    " My eyes swell up "  . Coronary artery disease   . Depression   . Fall 08/25/2012   Recent fall with residual knee and back pain.  . Family history of anesthesia complication    " [name] "  . Hypertension   . Memory disturbance 01/30/2016  . Stroke (HCC)   . Syncope and collapse 03/21/2019  . Tardive dyskinesia   . Tobacco abuse     Patient Active Problem List   Diagnosis Date Noted  . CAD (coronary artery disease) 03/22/2019  . Syncope 03/21/2019  . Unstable angina (HCC)   . Abnormal nuclear stress test   . Chest pain 08/13/2018  . Hypokalemia 08/11/2018  . Tobacco abuse 08/11/2018  .  Chest pain with high risk for cardiac etiology 08/10/2018  . Memory disturbance 01/30/2016  . Acute encephalopathy 05/23/2014  . Altered mental status 12/08/2013  . Schizophrenia, paranoid type (HCC) 04/28/2012  . Cocaine abuse (HCC) 04/28/2012  . Cannabis abuse 04/28/2012  . Substance induced mood disorder (HCC) 04/28/2012  . Noncompliance 09/16/2011  . Anxiety 09/16/2011  . Constipation 09/16/2011  . Chest pain 09/14/2011  . SUBSTANCE ABUSE, MULTIPLE 12/03/2008  . HYPERLIPIDEMIA 08/01/2008  . MIGRAINE HEADACHE 08/01/2008  . ALLERGIC RHINITIS WITH CONJUNCTIVITIS 08/01/2008  . ASTHMA 08/01/2008  . DEPRESSION 06/20/2008  . URI 06/20/2008  . ANXIETY DISORDER, GENERALIZED 05/28/2008  . HYPERTENSION, BENIGN ESSENTIAL 05/28/2008  . GERD 05/28/2008  . BACTERIAL VAGINITIS 05/24/2008  . DEEP VENOUS THROMBOPHLEBITIS, LEFT, LEG, HX OF 10/18/1988    Past Surgical History:  Procedure Laterality Date  . ABDOMINAL HYSTERECTOMY    . CARDIAC CATHETERIZATION    . RIGHT/LEFT HEART CATH AND CORONARY ANGIOGRAPHY N/A 08/14/2018   Procedure: RIGHT/LEFT HEART CATH AND CORONARY ANGIOGRAPHY;  Surgeon: Marykay Lex, MD;  Location: [facility] INVASIVE CV LAB;  Service: Cardiovascular;  Laterality: N/A;     OB History   No obstetric history on file.     Family History  Problem Relation Age of Onset  . Hypertension Mother   . Stroke Mother   . Heart attack Mother   . Heart attack Father   .  Heart attack Brother   . Heart attack Sister   . Dementia Neg Hx     Social History   Tobacco Use  . Smoking status: Former Smoker    Packs/day: 1.00    Types: Cigarettes    Quit date: 08/21/2018    Years since quitting: 2.3  . Smokeless tobacco: Never Used  Vaping Use  . Vaping Use: Never used  Substance Use Topics  . Alcohol use: Not Currently    Alcohol/week: 0.0 standard drinks    Comment: About 1 qt per week  . Drug use: Not Currently    Types: Cocaine, Marijuana    Comment: Former user     Home Medications Prior to Admission medications   Medication Sig Start Date End Date Taking? Authorizing Provider  albuterol (PROVENTIL HFA;VENTOLIN HFA) 108 (90 Base) MCG/ACT inhaler Inhale 1-2 puffs into the lungs every 6 (six) hours as needed for wheezing or shortness of breath (or cough). 12/31/18   Arlyn Dunning, PA-C  amLODipine (NORVASC) 2.5 MG tablet Take 1 tablet (2.5 mg total) by mouth daily. 03/23/19   Theotis Barrio, MD  aspirin 81 MG chewable tablet Chew 1 tablet (81 mg total) by mouth daily. 03/23/19   Theotis Barrio, MD  atorvastatin (LIPITOR) 20 MG tablet Take 1 tablet (20 mg total) by mouth daily at 6 PM. 03/24/19   Theotis Barrio, MD  carvedilol (COREG) 6.25 MG tablet Take 1 tablet (6.25 mg total) by mouth 2 (two) times daily with a meal. 03/23/19   Theotis Barrio, MD  folic acid (FOLVITE) 1 MG tablet Take 1 tablet (1 mg total) by mouth daily. Patient not taking: Reported on 03/21/2019 08/15/18   Kathlen Mody, MD  loratadine (CLARITIN) 10 MG tablet Take 1 tablet (10 mg total) by mouth daily. 04/28/19   Renne Crigler, PA-C  LORazepam (ATIVAN) 0.5 MG tablet TK 1 T PO D PRN 04/05/19   [provider]  Multiple Vitamin (MULTIVITAMIN WITH MINERALS) TABS tablet Take 1 tablet by mouth daily. Patient not taking: Reported on 03/21/2019 08/15/18   Kathlen Mody, MD  nicotine (NICODERM CQ - DOSED IN MG/24 HOURS) 14 mg/24hr patch Place 1 patch (14 mg total) onto the skin daily. Patient not taking: Reported on 03/21/2019 08/15/18   Kathlen Mody, MD  QUEtiapine (SEROQUEL) 100 MG tablet Take 1 tablet (100 mg total) by mouth daily. 03/23/19   Theotis Barrio, MD  sertraline (ZOLOFT) 50 MG tablet Take 1 tablet (50 mg total) by mouth daily. 03/23/19   Theotis Barrio, MD  thiamine 100 MG tablet Take 1 tablet (100 mg total) by mouth daily. Patient not taking: Reported on 03/21/2019 08/15/18   Kathlen Mody, MD    Allergies    Clonazepam, Doxycycline, Fosinopril sodium, Hydrochlorothiazide w-triamterene,  Penicillins, and Sulfa antibiotics  Review of Systems   Review of Systems  Unable to perform ROS: Mental status change  .   Physical Exam Updated Vital Signs BP 101/67   Pulse (!) 110   Temp 99 F (37.2 C)   Resp 18   SpO2 98%   Physical Exam Vitals and nursing note reviewed.  Constitutional:      General: She is not in acute distress.    Appearance: Normal appearance. She is not ill-appearing, toxic-appearing or diaphoretic.  HENT:     Head: Normocephalic and atraumatic.     Right Ear: External ear normal.     Left Ear: External ear normal.     Nose:  Nose normal.     Mouth/Throat:     Mouth: Mucous membranes are moist.     Pharynx: Oropharynx is clear. No oropharyngeal exudate or posterior oropharyngeal erythema.  Eyes:     General: No scleral icterus.       Right eye: No discharge.        Left eye: No discharge.     Extraocular Movements: Extraocular movements intact.     Conjunctiva/sclera: Conjunctivae normal.     Pupils: Pupils are equal, round, and reactive to light.  Cardiovascular:     Rate and Rhythm: Normal rate and regular rhythm.     Pulses: Normal pulses.     Heart sounds: Normal heart sounds. No murmur heard. No friction rub. No gallop.   Pulmonary:     Effort: Pulmonary effort is normal. No respiratory distress.     Breath sounds: Normal breath sounds. No stridor. No wheezing, rhonchi or rales.  Abdominal:     General: Abdomen is flat.     Tenderness: There is no abdominal tenderness.  Musculoskeletal:        General: Normal range of motion.     Cervical back: Normal range of motion and neck supple. No tenderness.  Skin:    General: Skin is warm and dry.  Neurological:     General: No focal deficit present.     Mental Status: She is alert and oriented to person, place, and time.     Comments: Patient appears fatigued.  Only oriented to self and location.  Unsure of the year or president.  She states it is 2020.  Unsure if her speech is dysarthric  or secondary to psychiatric disorder/ingestion/fatigue.  No facial droop.  Moving all 4 extremities without difficulty.  Strength is 5 out of 5 and symmetric in all 4 extremities.  Distal sensation intact.  Extraocular movements are intact.  Psychiatric:        Mood and Affect: Mood normal.        Behavior: Behavior normal.     ED Results / Procedures / Treatments   Labs (all labs ordered are listed, but only abnormal results are displayed) Labs Reviewed  COMPREHENSIVE METABOLIC PANEL - Abnormal; Notable for the following components:      Result Value   CO2 17 (*)    Glucose, Bld 108 (*)    BUN 32 (*)    Creatinine, Ser 1.47 (*)    Calcium 8.8 (*)    GFR, Estimated 39 (*)    All other components within normal limits  SALICYLATE LEVEL - Abnormal; Notable for the following components:   Salicylate Lvl 51.0 (*)    All other components within normal limits  ACETAMINOPHEN LEVEL - Abnormal; Notable for the following components:   Acetaminophen (Tylenol), Serum <10 (*)    All other components within normal limits  I-STAT ARTERIAL BLOOD GAS, ED - Abnormal; Notable for the following components:   pCO2 arterial 28.2 (*)    pO2, Arterial 79 (*)    Bicarbonate 19.4 (*)    TCO2 20 (*)    Acid-base deficit 3.0 (*)    Potassium 3.1 (*)    Calcium, Ion 1.02 (*)    All other components within normal limits  RESP PANEL BY RT-PCR (FLU A&B, COVID) ARPGX2  CBC WITH DIFFERENTIAL/PLATELET  ETHANOL  URINALYSIS, ROUTINE W REFLEX MICROSCOPIC  RAPID URINE DRUG SCREEN, HOSP PERFORMED  MAGNESIUM  PROTIME-INR   EKG ED ECG REPORT   Date: 12/29/2020  Rate: 107  Rhythm: sinus tachycardia  QRS Axis: normal  Intervals: normal  ST/T Wave abnormalities: normal  Conduction Disutrbances:none  Narrative Interpretation:   Old EKG Reviewed: none available  I have personally reviewed the EKG tracing and agree with the computerized printout as noted.  Radiology CT Head Wo Contrast  Result Date:  12/29/2020 CLINICAL DATA:  Delirium.  Fall last night at facility. EXAM: CT HEAD WITHOUT CONTRAST TECHNIQUE: Contiguous axial images were obtained from the base of the skull through the vertex without intravenous contrast. COMPARISON:  Head CT 03/21/2019 FINDINGS: Brain: No intracranial hemorrhage, mass effect, or midline shift. Remote infarct involving the right posterior temporal and occipital lobe. Similar degree of chronic small vessel ischemia from prior. Incidental bilateral basal gangliar mineralization, likely senescent. No hydrocephalus. The basilar cisterns are patent. No evidence of territorial infarct or acute ischemia. No extra-axial or intracranial fluid collection. Vascular: Atherosclerosis of skullbase vasculature without hyperdense vessel or abnormal calcification. Skull: No fracture or focal lesion. Sinuses/Orbits: Paranasal sinuses and mastoid air cells are clear. The visualized orbits are unremarkable. Other: None. IMPRESSION: 1. No acute intracranial abnormality. 2. Remote right posterior temporal and occipital lobe infarct. 3. Unchanged chronic small vessel ischemia. Electronically Signed   By: Narda RutherfordMelanie  Sanford M.D.   On: 12/29/2020 17:57   DG Chest Portable 1 View  Result Date: 12/29/2020 CLINICAL DATA:  Altered mental status. EXAM: PORTABLE CHEST 1 VIEW COMPARISON:  April 30, 2020 FINDINGS: Mild, chronic appearing increased interstitial lung markings are seen. A stable ovoid opacity is seen overlying the infrahilar region on the right. There is no evidence of a pleural effusion or pneumothorax. The cardiac silhouette is mildly enlarged. Degenerative changes seen throughout the thoracic spine. IMPRESSION: Stable exam without evidence of acute or active cardiopulmonary disease. Electronically Signed   By: Aram Candelahaddeus  Houston M.D.   On: 12/29/2020 17:51    Procedures Procedures   Medications Ordered in ED Medications  potassium chloride 10 mEq in 100 mL IVPB (10 mEq Intravenous New  Bag/Given 12/29/20 2048)  magnesium sulfate IVPB 1 g 100 mL (has no administration in time range)  sodium bicarbonate 100 mEq in dextrose 5 % 1,000 mL infusion (has no administration in time range)  lactated ringers bolus 1,000 mL (1,000 mLs Intravenous New Bag/Given 12/29/20 2047)  potassium chloride SA (KLOR-CON) CR tablet 40 mEq (40 mEq Oral Given 12/29/20 2041)    ED Course  I have reviewed the triage vital signs and the nursing notes.  Pertinent labs & imaging results that were available during my care of the patient were reviewed by me and considered in my medical decision making (see chart for details).  Clinical Course as of 12/29/20 2140  Mon Dec 29, 2020  [id number] Salicylate Lvl(!!): 51.0 [LJ]  1932 I spoke to [place] poison control regarding this patient.  They recommended that we give patient lactated Ringer's with a goal urine output of 2 cc/kg/h.  Goal potassium of 4.2.  Goal magnesium of 1.2.  Also recommended that we check a PT/INR as well as an ABG.  If her pH is less than 7.5 they recommend an IV bolus of bicarb at 1 mEq/kg. [LJ]  [id number] Patient discussed with nephrology.  Do not feel that there are any additional interventions on their end.  Recommend admission and medicine team can reconsult if needed. [LJ]    Clinical Course User Index [LJ] Placido SouJoldersma, Logan, PA-C   MDM Rules/Calculators/A&P  Pt is a 70 y.o. female who presents to the ED due to slurred speech.   Labs: CBC without abnormalities. CMP with a CO2 of 17, glucose of 108, BUN of 32, creatinine of 1.47, calcium of 8.8, GFR of 39. Ethanol less than 10. Acetaminophen less than 10. ABG with a pH of 7.446.  Bicarbonate of 19.4.  Imaging: CT scan of the head shows no acute intracranial abnormality. Chest x-ray is negative.  I, Placido Sou, PA-C, personally reviewed and evaluated these images and lab results as part of my medical decision-making.  Patient found to have an  elevated salicylate level.  There was concern at her facility that she might have been ingesting either BC powders or Tylenol PM.  It would appear the patient has been taking a significant amount of BC powders.  She was discussed with [facility] poison control with findings as noted above and the clinical course.  Patient started on a bicarb drip, 1 g of magnesium, as well as p.o./IV potassium.  Imaging is reassuring.  Patient also discussed with nephrology who agrees with poison control's recommendations.  They do not feel that emergent dialysis is necessary.  If necessary, they state they can be reconsulted.  Patient stable at this time.  Feel that patient will require admission.  Respiratory panel obtained.  Will discuss with the medicine team for likely admission.  Note: Portions of this report may have been transcribed using voice recognition software. Every effort was made to ensure accuracy; however, inadvertent computerized transcription errors may be present.    Final Clinical Impression(s) / ED Diagnoses Final diagnoses:  Salicylate overdose, undetermined intent, initial encounter  AKI (acute kidney injury) [facility])   Rx / DC Orders ED Discharge Orders    None       Placido Sou, PA-C 12/29/20 2141    Wynetta Fines, MD 01/02/21 [phone]

## 2020-12-29 NOTE — ED Notes (Signed)
Pt ambulated to restroom with standby assist, attempted to catch urine in a cup but missed.  Recommend using hat to catch sample next time.

## 2020-12-29 NOTE — ED Notes (Signed)
Suzette Battiest, daughter, [phone] would like to speak with the pt

## 2020-12-29 NOTE — H&P (Signed)
History and Physical    Danielle [name] SWN:[id number] DOB: 1950-12-16 DOA: 12/29/2020  [facility]P: [facility]   Patient coming from: Group home  Chief Complaint: altered speech  HPI: Danielle [name] is a 70 y.o. female with medical history significant for polysubstance abuse, memory disturbance, bipolar affective disorder, schizophrenia, unstable angina, syncope, who presents by EMS for evaluation of slurred speech.  Patient resided in [place] [facility] [place]. Per EMS, they were called out due to slurred speech.  They were told by the facility staff that she did have a fall yesterday evening but no further details available and pt does not remember the event.  She typically speaks clearly, per report from staff at her facility.  They felt that her speech was more slurred than normal today.  Patient is currently oriented to self and place but otherwise cannot tell me the year or current president. She appears fatigued. Staff at group home notified ER provider that they were concerned she may have taken a lot of goody powders. She walked by herself to a convenience store earlier today. Unsure of the amount or time.  Patient moving all 4 extremities.  Following commands when asked.   Review of Systems:  General: Denies fever, chills, weight loss, night sweats.  Denies dizziness.  HENT: Denies head trauma, headache, denies change in hearing, tinnitus.  Denies nasal bleeding. Denies sore throat.  Denies difficulty swallowing Eyes: Denies blurry vision, pain in eye, drainage.  Denies discoloration of eyes. Neck: Denies pain.  Denies swelling.  Denies pain with movement. Cardiovascular: Denies chest pain, palpitations.  Denies edema.  Denies orthopnea Respiratory: Denies shortness of breath, cough.  Denies wheezing.  Denies sputum production Gastrointestinal: Denies abdominal pain, swelling. Denies nausea, vomiting, diarrhea. Denies melena.  Denies hematemesis. Musculoskeletal: Denies  limitation of movement.  Denies deformity or swelling.  Denies pain.  Denies arthralgias or myalgias. Genitourinary: Denies pelvic pain.  Denies urinary frequency or hesitancy.  Denies dysuria.  Skin: Denies rash.  Denies petechiae, purpura, ecchymosis. Neurological: Denies headache.  Denies syncope.  Denies seizure activity.  Denies paresthesia.  Denies drooping face.  Denies visual change. Psychiatric: Denies depression, anxiety. Denies hallucinations.  Past Medical History:  Diagnosis Date  . Asthma   . Bipolar affective disorder, currently depressed, mild (HCC)   . Chest pain   . Complication of anesthesia    " My eyes swell up "  . Coronary artery disease   . Depression   . Fall 08/25/2012   Recent fall with residual knee and back pain.  . Family history of anesthesia complication    " [name] "  . Hypertension   . Memory disturbance 01/30/2016  . Stroke (HCC)   . Syncope and collapse 03/21/2019  . Tardive dyskinesia   . Tobacco abuse     Past Surgical History:  Procedure Laterality Date  . ABDOMINAL HYSTERECTOMY    . CARDIAC CATHETERIZATION    . RIGHT/LEFT HEART CATH AND CORONARY ANGIOGRAPHY N/A 08/14/2018   Procedure: RIGHT/LEFT HEART CATH AND CORONARY ANGIOGRAPHY;  Surgeon: Marykay Lex, MD;  Location: [facility] INVASIVE CV LAB;  Service: Cardiovascular;  Laterality: N/A;    Social History  reports that she quit smoking about 2 years ago. Her smoking use included cigarettes. She smoked 1.00 pack per day. She has never used smokeless tobacco. She reports previous alcohol use. She reports previous drug use. Drugs: Cocaine and Marijuana.  Allergies  Allergen Reactions  . Clonazepam Swelling    Pt was recently  given a triamterene hydrochlorothiazide combination as well as clonazepam and had an allergic reaction to one of them. Caused swelling of face and eyes  . Doxycycline Swelling    Swelling of face and eyes  . Fosinopril Sodium Swelling     swelling  face,lips-angioedema from ACE I  . Hydrochlorothiazide W-Triamterene Swelling    Pt was recently given a triamterene hydrochlorothiazide combination as well as clonazepam and had an allergic reaction to one of them. Swelling of face and eyes  . Penicillins Swelling    Face and eyes Has patient had a PCN reaction causing immediate rash, facial/tongue/throat swelling, SOB or lightheadedness with hypotension: Yes Has patient had a PCN reaction causing severe rash involving mucus membranes or skin necrosis: No Has patient had a PCN reaction that required hospitalization: No Has patient had a PCN reaction occurring within the last 10 years: No If all of the above answers are "NO", then may proceed with Cephalosporin use.   . Sulfa Antibiotics Other (See Comments)    Unknown reaction    Family History  Problem Relation Age of Onset  . Hypertension Mother   . Stroke Mother   . Heart attack Mother   . Heart attack Father   . Heart attack Brother   . Heart attack Sister   . Dementia Neg Hx      Prior to Admission medications   Medication Sig Start Date End Date Taking? Authorizing Provider  albuterol (PROVENTIL HFA;VENTOLIN HFA) 108 (90 Base) MCG/ACT inhaler Inhale 1-2 puffs into the lungs every 6 (six) hours as needed for wheezing or shortness of breath (or cough). 12/31/18   Arlyn DunningMcLean, Kelly A, PA-C  amLODipine (NORVASC) 2.5 MG tablet Take 1 tablet (2.5 mg total) by mouth daily. 03/23/19   Theotis BarrioLee, Joshua K, MD  aspirin 81 MG chewable tablet Chew 1 tablet (81 mg total) by mouth daily. 03/23/19   Theotis BarrioLee, Joshua K, MD  atorvastatin (LIPITOR) 20 MG tablet Take 1 tablet (20 mg total) by mouth daily at 6 PM. 03/24/19   Theotis BarrioLee, Joshua K, MD  carvedilol (COREG) 6.25 MG tablet Take 1 tablet (6.25 mg total) by mouth 2 (two) times daily with a meal. 03/23/19   Theotis BarrioLee, Joshua K, MD  folic acid (FOLVITE) 1 MG tablet Take 1 tablet (1 mg total) by mouth daily. Patient not taking: Reported on 03/21/2019 08/15/18   Kathlen ModyAkula, Vijaya,  MD  loratadine (CLARITIN) 10 MG tablet Take 1 tablet (10 mg total) by mouth daily. 04/28/19   Renne CriglerGeiple, Joshua, PA-C  LORazepam (ATIVAN) 0.5 MG tablet TK 1 T PO D PRN 04/05/19   [provider]  Multiple Vitamin (MULTIVITAMIN WITH MINERALS) TABS tablet Take 1 tablet by mouth daily. Patient not taking: Reported on 03/21/2019 08/15/18   Kathlen ModyAkula, Vijaya, MD  nicotine (NICODERM CQ - DOSED IN MG/24 HOURS) 14 mg/24hr patch Place 1 patch (14 mg total) onto the skin daily. Patient not taking: Reported on 03/21/2019 08/15/18   Kathlen ModyAkula, Vijaya, MD  QUEtiapine (SEROQUEL) 100 MG tablet Take 1 tablet (100 mg total) by mouth daily. 03/23/19   Theotis BarrioLee, Joshua K, MD  sertraline (ZOLOFT) 50 MG tablet Take 1 tablet (50 mg total) by mouth daily. 03/23/19   Theotis BarrioLee, Joshua K, MD  thiamine 100 MG tablet Take 1 tablet (100 mg total) by mouth daily. Patient not taking: Reported on 03/21/2019 08/15/18   Kathlen ModyAkula, Vijaya, MD    Physical Exam: Vitals:   12/29/20 1643 12/29/20 1645  BP: 101/67   Pulse: (!) 110  Resp: 18   Temp: 99 F (37.2 C)   SpO2: 95% 98%    Constitutional: NAD, calm, comfortable Vitals:   12/29/20 1643 12/29/20 1645  BP: 101/67   Pulse: (!) 110   Resp: 18   Temp: 99 F (37.2 C)   SpO2: 95% 98%   General: WDWN, Alert and oriented to self and place Eyes: EOMI, PERRL, conjunctivae normal.  Sclera nonicteric HENT:  [place]/AT, external ears normal.  Nares patent without epistasis.  Mucous membranes are moist. Posterior pharynx clear of any exudate or lesions. Neck: Soft, normal range of motion, supple, no masses, no thyromegaly.  Trachea midline Respiratory: clear to auscultation bilaterally, no wheezing, no crackles. Normal respiratory effort. No accessory muscle use.  Cardiovascular: Tachycardia with heart rate of 100-108, no murmurs / rubs / gallops. No extremity edema. 2+ pedal pulses.  Abdomen: Soft, no tenderness, nondistended, no rebound or guarding.  No masses palpated. Bowel sounds  normoactive Musculoskeletal: FROM. no cyanosis. No joint deformity upper and lower extremities. Normal muscle tone.  Skin: Warm, dry, intact no rashes, lesions, ulcers. No induration Neurologic: CN 2-12 grossly intact.  thickened speech.  Sensation intact, patella DTR +1 bilaterally. Strength 5/5 in all extremities.  Moves all 4 extremity spontaneously.  No tremor. Psychiatric: Normal mood.  Flat affect.   Labs on Admission: I have personally reviewed following labs and imaging studies  CBC: Recent Labs  Lab 12/29/20 1800 12/29/20 2006  WBC 7.7  --   NEUTROABS 3.8  --   HGB 13.7 14.3  HCT 40.8 42.0  MCV 89.1  --   PLT 317  --     Basic Metabolic Panel: Recent Labs  Lab 12/29/20 1800 12/29/20 1925 12/29/20 2006  NA 136  --  139  K 3.5  --  3.1*  CL 104  --   --   CO2 17*  --   --   GLUCOSE 108*  --   --   BUN 32*  --   --   CREATININE 1.47*  --   --   CALCIUM 8.8*  --   --   MG  --  2.0  --     GFR: CrCl cannot be calculated (Unknown ideal weight.).  Liver Function Tests: Recent Labs  Lab 12/29/20 1800  AST 41  ALT 18  ALKPHOS 61  BILITOT 0.8  PROT 7.0  ALBUMIN 3.7    Urine analysis:    Component Value Date/Time   COLORURINE STRAW (A) 04/28/2019 1525   APPEARANCEUR CLEAR 04/28/2019 1525   LABSPEC <1.005 (L) 04/28/2019 1525   PHURINE 6.5 04/28/2019 1525   GLUCOSEU NEGATIVE 04/28/2019 1525   HGBUR NEGATIVE 04/28/2019 1525   HGBUR negative 10/25/2008 0936   BILIRUBINUR NEGATIVE 04/28/2019 1525   KETONESUR NEGATIVE 04/28/2019 1525   PROTEINUR NEGATIVE 04/28/2019 1525   UROBILINOGEN 0.2 03/23/2018 1531   NITRITE NEGATIVE 04/28/2019 1525   LEUKOCYTESUR SMALL (A) 04/28/2019 1525    Radiological Exams on Admission: CT Head Wo Contrast  Result Date: 12/29/2020 CLINICAL DATA:  Delirium.  Fall last night at facility. EXAM: CT HEAD WITHOUT CONTRAST TECHNIQUE: Contiguous axial images were obtained from the base of the skull through the vertex without  intravenous contrast. COMPARISON:  Head CT 03/21/2019 FINDINGS: Brain: No intracranial hemorrhage, mass effect, or midline shift. Remote infarct involving the right posterior temporal and occipital lobe. Similar degree of chronic small vessel ischemia from prior. Incidental bilateral basal gangliar mineralization, likely senescent. No hydrocephalus. The basilar cisterns are patent.  No evidence of territorial infarct or acute ischemia. No extra-axial or intracranial fluid collection. Vascular: Atherosclerosis of skullbase vasculature without hyperdense vessel or abnormal calcification. Skull: No fracture or focal lesion. Sinuses/Orbits: Paranasal sinuses and mastoid air cells are clear. The visualized orbits are unremarkable. Other: None. IMPRESSION: 1. No acute intracranial abnormality. 2. Remote right posterior temporal and occipital lobe infarct. 3. Unchanged chronic small vessel ischemia. Electronically Signed   By: Narda Rutherford M.D.   On: 12/29/2020 17:57   DG Chest Portable 1 View  Result Date: 12/29/2020 CLINICAL DATA:  Altered mental status. EXAM: PORTABLE CHEST 1 VIEW COMPARISON:  April 30, 2020 FINDINGS: Mild, chronic appearing increased interstitial lung markings are seen. A stable ovoid opacity is seen overlying the infrahilar region on the right. There is no evidence of a pleural effusion or pneumothorax. The cardiac silhouette is mildly enlarged. Degenerative changes seen throughout the thoracic spine. IMPRESSION: Stable exam without evidence of acute or active cardiopulmonary disease. Electronically Signed   By: Aram Candela M.D.   On: 12/29/2020 17:51    EKG: Independently reviewed.  EKG shows sinus tachycardia with no acute ST elevation or depression.  Normal QTc  Assessment/Plan Principal Problem:   AKI (acute kidney injury)  Ms. Calia was placed on medical telemetry floor for observation. IV fluid hydration with LR at 125 ml/hr overnight Recheck electrolytes renal function  morning Patient was given infusion of saline bicarbonate in the emergency room  Active Problems:   Dysarthria Patient with thickened slurred speech earlier today per report.  Patient states that speech is proved but is not back to her normal level.  MRI brain pending to rule out CVA. DDx includes TIA/CVA Dysarthria is not a common symptom of salicylate toxicity    HYPERTENSION, BENIGN ESSENTIAL New home dose of Coreg and Norvasc.  Monitor blood pressure    Hypokalemia Supplemental potassium provided.  Goal is to keep above 4.1 per poison control in light of elevated salicylate level    Overdose of salicylate Patient is not had any nausea vomiting dizziness with salicylate toxicity.  Cells light level is elevated at greater than 50.  Poison control recommended IV fluid hydration.  Case was discussed with nephrology by the ER provider and nephrology did not feel there is any emergent intervention to be done at this time besides IV fluid hydration    Schizophrenia, paranoid type  Chronic.  Continue home medications     DVT prophylaxis: SCDs for DVT prophylaxis.  Code Status:   Full code  Family Communication:  Diagnosis and plan discussed with patient.  Questions answered.  Further recommendations to follow as clinically indicated Disposition Plan:   Patient is from:  Group home  Anticipated DC to:  Group home  Anticipated DC date:  Anticipate less than 2 midnight stay  Anticipated DC barriers: No barriers to discharge identified at this time  Admission status:  Observation  Carlton Adam MD [facility]  How to contact the [facility] Attending or Consulting provider 7A - 7P or covering provider during after hours 7P -7A, for this patient?   1. Check the care team in [facility] and look for a) attending/consulting [facility] provider listed and b) the [facility] team listed 2. Log into [email or web address] and use [facility]'s universal password to access. If you do not have the password, please  contact the hospital operator. 3. Locate the [facility] provider you are looking for under [facility] and page to a number that you can be directly reached. 4.  If you still have difficulty reaching the provider, please page the [facility] (Director on Call) for the Hospitalists listed on amion for assistance.  12/29/2020, 10:22 PM

## 2020-12-29 NOTE — ED Notes (Signed)
Patient transported to X-ray 

## 2020-12-30 ENCOUNTER — Observation Stay (HOSPITAL_COMMUNITY): Payer: Medicare Other

## 2020-12-30 ENCOUNTER — Inpatient Hospital Stay (HOSPITAL_COMMUNITY): Payer: Medicare Other

## 2020-12-30 ENCOUNTER — Other Ambulatory Visit: Payer: Self-pay

## 2020-12-30 ENCOUNTER — Encounter (HOSPITAL_COMMUNITY): Payer: Self-pay | Admitting: Internal Medicine

## 2020-12-30 DIAGNOSIS — F200 Paranoid schizophrenia: Secondary | ICD-10-CM | POA: Diagnosis present

## 2020-12-30 DIAGNOSIS — W19XXXA Unspecified fall, initial encounter: Secondary | ICD-10-CM | POA: Diagnosis present

## 2020-12-30 DIAGNOSIS — J45909 Unspecified asthma, uncomplicated: Secondary | ICD-10-CM | POA: Diagnosis present

## 2020-12-30 DIAGNOSIS — R471 Dysarthria and anarthria: Secondary | ICD-10-CM | POA: Diagnosis present

## 2020-12-30 DIAGNOSIS — T39094A Poisoning by salicylates, undetermined, initial encounter: Secondary | ICD-10-CM | POA: Diagnosis not present

## 2020-12-30 DIAGNOSIS — G459 Transient cerebral ischemic attack, unspecified: Secondary | ICD-10-CM

## 2020-12-30 DIAGNOSIS — F3131 Bipolar disorder, current episode depressed, mild: Secondary | ICD-10-CM | POA: Diagnosis present

## 2020-12-30 DIAGNOSIS — T39092A Poisoning by salicylates, intentional self-harm, initial encounter: Secondary | ICD-10-CM | POA: Diagnosis present

## 2020-12-30 DIAGNOSIS — E785 Hyperlipidemia, unspecified: Secondary | ICD-10-CM | POA: Diagnosis present

## 2020-12-30 DIAGNOSIS — R4701 Aphasia: Secondary | ICD-10-CM | POA: Diagnosis present

## 2020-12-30 DIAGNOSIS — I340 Nonrheumatic mitral (valve) insufficiency: Secondary | ICD-10-CM

## 2020-12-30 DIAGNOSIS — F411 Generalized anxiety disorder: Secondary | ICD-10-CM

## 2020-12-30 DIAGNOSIS — F1410 Cocaine abuse, uncomplicated: Secondary | ICD-10-CM | POA: Diagnosis present

## 2020-12-30 DIAGNOSIS — Z7982 Long term (current) use of aspirin: Secondary | ICD-10-CM | POA: Diagnosis not present

## 2020-12-30 DIAGNOSIS — Z20822 Contact with and (suspected) exposure to covid-19: Secondary | ICD-10-CM | POA: Diagnosis present

## 2020-12-30 DIAGNOSIS — Z823 Family history of stroke: Secondary | ICD-10-CM | POA: Diagnosis not present

## 2020-12-30 DIAGNOSIS — F331 Major depressive disorder, recurrent, moderate: Secondary | ICD-10-CM | POA: Diagnosis not present

## 2020-12-30 DIAGNOSIS — F1210 Cannabis abuse, uncomplicated: Secondary | ICD-10-CM | POA: Diagnosis present

## 2020-12-30 DIAGNOSIS — E876 Hypokalemia: Secondary | ICD-10-CM | POA: Diagnosis present

## 2020-12-30 DIAGNOSIS — I10 Essential (primary) hypertension: Secondary | ICD-10-CM | POA: Diagnosis present

## 2020-12-30 DIAGNOSIS — Z79899 Other long term (current) drug therapy: Secondary | ICD-10-CM | POA: Diagnosis not present

## 2020-12-30 DIAGNOSIS — I2510 Atherosclerotic heart disease of native coronary artery without angina pectoris: Secondary | ICD-10-CM | POA: Diagnosis present

## 2020-12-30 DIAGNOSIS — N179 Acute kidney failure, unspecified: Secondary | ICD-10-CM | POA: Diagnosis present

## 2020-12-30 DIAGNOSIS — R000 Tachycardia, unspecified: Secondary | ICD-10-CM | POA: Diagnosis present

## 2020-12-30 DIAGNOSIS — I69322 Dysarthria following cerebral infarction: Secondary | ICD-10-CM | POA: Diagnosis not present

## 2020-12-30 DIAGNOSIS — Z8249 Family history of ischemic heart disease and other diseases of the circulatory system: Secondary | ICD-10-CM | POA: Diagnosis not present

## 2020-12-30 DIAGNOSIS — G2401 Drug induced subacute dyskinesia: Secondary | ICD-10-CM | POA: Diagnosis present

## 2020-12-30 DIAGNOSIS — Z9114 Patient's other noncompliance with medication regimen: Secondary | ICD-10-CM | POA: Diagnosis not present

## 2020-12-30 DIAGNOSIS — E860 Dehydration: Secondary | ICD-10-CM | POA: Diagnosis present

## 2020-12-30 LAB — SALICYLATE LEVEL
Salicylate Lvl: 36.8 mg/dL — ABNORMAL HIGH (ref 7.0–30.0)
Salicylate Lvl: 34.1 mg/dL — ABNORMAL HIGH (ref 7.0–30.0)
Salicylate Lvl: 29.1 mg/dL (ref 7.0–30.0)
Salicylate Lvl: 25.7 mg/dL (ref 7.0–30.0)

## 2020-12-30 LAB — BASIC METABOLIC PANEL
Anion gap: 12 (ref 5–15)
Anion gap: 8 (ref 5–15)
Anion gap: 9 (ref 5–15)
Anion gap: 10 (ref 5–15)
BUN: 33 mg/dL — ABNORMAL HIGH (ref 8–23)
BUN: 24 mg/dL — ABNORMAL HIGH (ref 8–23)
BUN: 20 mg/dL (ref 8–23)
BUN: 20 mg/dL (ref 8–23)
CO2: 18 mmol/L — ABNORMAL LOW (ref 22–32)
CO2: 24 mmol/L (ref 22–32)
CO2: 26 mmol/L (ref 22–32)
CO2: 26 mmol/L (ref 22–32)
Calcium: 8.4 mg/dL — ABNORMAL LOW (ref 8.9–10.3)
Calcium: 8.3 mg/dL — ABNORMAL LOW (ref 8.9–10.3)
Calcium: 8 mg/dL — ABNORMAL LOW (ref 8.9–10.3)
Calcium: 9 mg/dL (ref 8.9–10.3)
Chloride: 106 mmol/L (ref 98–111)
Chloride: 104 mmol/L (ref 98–111)
Chloride: 103 mmol/L (ref 98–111)
Chloride: 104 mmol/L (ref 98–111)
Creatinine, Ser: 1.36 mg/dL — ABNORMAL HIGH (ref 0.44–1.00)
Creatinine, Ser: 1.1 mg/dL — ABNORMAL HIGH (ref 0.44–1.00)
Creatinine, Ser: 1.03 mg/dL — ABNORMAL HIGH (ref 0.44–1.00)
Creatinine, Ser: 0.97 mg/dL (ref 0.44–1.00)
GFR, Estimated: 43 mL/min — ABNORMAL LOW (ref >60)
GFR, Estimated: 55 mL/min — ABNORMAL LOW (ref >60)
GFR, Estimated: 60 mL/min — ABNORMAL LOW (ref >60)
GFR, Estimated: >60 mL/min (ref >60)
Glucose, Bld: 91 mg/dL (ref 70–99)
Glucose, Bld: 135 mg/dL — ABNORMAL HIGH (ref 70–99)
Glucose, Bld: 157 mg/dL — ABNORMAL HIGH (ref 70–99)
Glucose, Bld: 97 mg/dL (ref 70–99)
Potassium: 3.5 mmol/L (ref 3.5–5.1)
Potassium: 3.1 mmol/L — ABNORMAL LOW (ref 3.5–5.1)
Potassium: 2.9 mmol/L — ABNORMAL LOW (ref 3.5–5.1)
Potassium: 3.7 mmol/L (ref 3.5–5.1)
Sodium: 136 mmol/L (ref 135–145)
Sodium: 136 mmol/L (ref 135–145)
Sodium: 138 mmol/L (ref 135–145)
Sodium: 140 mmol/L (ref 135–145)

## 2020-12-30 LAB — ECHOCARDIOGRAM COMPLETE
Area-P 1/2: 5.13 cm2
MV M vel: 4.19 m/s
MV Peak grad: 70.2 mmHg
S' Lateral: 3.8 cm

## 2020-12-30 LAB — HIV ANTIBODY (ROUTINE TESTING W REFLEX): HIV Screen 4th Generation wRfx: NONREACTIVE

## 2020-12-30 MED ORDER — SERTRALINE HCL 50 MG PO TABS
50.0000 mg | ORAL_TABLET | Freq: Every day | ORAL | Status: DC
  Administered 2020-12-30 – 2020-12-31 (×2): 50 mg via ORAL
  Filled 2020-12-30 (×2): qty 1

## 2020-12-30 MED ORDER — SODIUM BICARBONATE 8.4 % IV SOLN
INTRAVENOUS | Status: DC
  Filled 2020-12-30 (×3): qty 100

## 2020-12-30 MED ORDER — AMLODIPINE BESYLATE 5 MG PO TABS
2.5000 mg | ORAL_TABLET | Freq: Every day | ORAL | Status: DC
  Administered 2020-12-30: 2.5 mg via ORAL
  Filled 2020-12-30 (×2): qty 1

## 2020-12-30 MED ORDER — ACETAMINOPHEN 650 MG RE SUPP: 650.0000 mg | RECTAL | Status: DC | PRN

## 2020-12-30 MED ORDER — CARVEDILOL 6.25 MG PO TABS
6.2500 mg | ORAL_TABLET | Freq: Two times a day (BID) | ORAL | Status: DC
  Administered 2020-12-30 – 2020-12-31 (×3): 6.25 mg via ORAL
  Filled 2020-12-30 (×3): qty 1

## 2020-12-30 MED ORDER — STROKE: EARLY STAGES OF RECOVERY BOOK
Freq: Once | Status: AC
  Filled 2020-12-30: qty 1

## 2020-12-30 MED ORDER — SENNOSIDES-DOCUSATE SODIUM 8.6-50 MG PO TABS: 1.0000 | ORAL_TABLET | Freq: Every evening | ORAL | Status: DC | PRN

## 2020-12-30 MED ORDER — ACETAMINOPHEN 160 MG/5ML PO SOLN: 650.0000 mg | ORAL | Status: DC | PRN

## 2020-12-30 MED ORDER — QUETIAPINE FUMARATE 100 MG PO TABS
100.0000 mg | ORAL_TABLET | Freq: Every day | ORAL | Status: DC
  Administered 2020-12-30: 100 mg via ORAL
  Filled 2020-12-30: qty 1

## 2020-12-30 MED ORDER — POTASSIUM CHLORIDE CRYS ER 20 MEQ PO TBCR
40.0000 meq | EXTENDED_RELEASE_TABLET | Freq: Two times a day (BID) | ORAL | Status: DC
  Administered 2020-12-30 (×2): 40 meq via ORAL
  Filled 2020-12-30 (×2): qty 2

## 2020-12-30 MED ORDER — ATORVASTATIN CALCIUM 10 MG PO TABS
20.0000 mg | ORAL_TABLET | Freq: Every day | ORAL | Status: DC
  Administered 2020-12-30 – 2020-12-31 (×2): 20 mg via ORAL
  Filled 2020-12-30 (×2): qty 2

## 2020-12-30 MED ORDER — ASPIRIN 81 MG PO CHEW
81.0000 mg | CHEWABLE_TABLET | Freq: Every day | ORAL | Status: DC
  Administered 2020-12-30 – 2020-12-31 (×2): 81 mg via ORAL
  Filled 2020-12-30 (×2): qty 1

## 2020-12-30 MED ORDER — SODIUM BICARBONATE 8.4 % IV SOLN
INTRAVENOUS | Status: DC
  Filled 2020-12-30 (×2): qty 100

## 2020-12-30 MED ORDER — QUETIAPINE FUMARATE 100 MG PO TABS
100.0000 mg | ORAL_TABLET | Freq: Every day | ORAL | Status: DC
  Filled 2020-12-30: qty 1

## 2020-12-30 MED ORDER — ALBUTEROL SULFATE (2.5 MG/3ML) 0.083% IN NEBU: 2.5000 mg | INHALATION_SOLUTION | Freq: Four times a day (QID) | RESPIRATORY_TRACT | Status: DC | PRN

## 2020-12-30 MED ORDER — DIPHENHYDRAMINE HCL 50 MG/ML IJ SOLN
50.0000 mg | Freq: Once | INTRAMUSCULAR | Status: AC
  Administered 2020-12-30: 50 mg via INTRAVENOUS
  Filled 2020-12-30: qty 1

## 2020-12-30 MED ORDER — ACETAMINOPHEN 325 MG PO TABS
650.0000 mg | ORAL_TABLET | ORAL | Status: DC | PRN
  Administered 2020-12-30 – 2020-12-31 (×2): 650 mg via ORAL
  Filled 2020-12-30 (×2): qty 2

## 2020-12-30 MED ORDER — LACTATED RINGERS IV SOLN: INTRAVENOUS | Status: DC

## 2020-12-30 NOTE — Progress Notes (Signed)
Carotid duplex bilateral study completed.   Please see CV Proc for preliminary results.   Ebonique Hallstrom, RDMS, RVT  

## 2020-12-30 NOTE — Progress Notes (Signed)
  Echocardiogram 2D Echocardiogram has been performed.  Danielle [name] 12/30/2020, 3:50 PM

## 2020-12-30 NOTE — ED Notes (Signed)
Poison Control called for an update. MD made aware and new orders given

## 2020-12-30 NOTE — Progress Notes (Signed)
PROGRESS NOTE  Danielle [name] FAO:[id number] DOB: 1951-07-30 DOA: 12/29/2020 PCP: [facility]  HPI/Recap of past 24 hours: Danielle [name] is a 70 y.o. female with medical history significant for chronic depression, polysubstance abuse, memory disturbance, bipolar affective disorder, schizophrenia, unstable angina, syncope, who presented by EMS from group home for evaluation of slurred speech.Patient resided in [place].Per EMS, they were called out due to slurred speech. They were told by the facility staff that she did have a fall yesterday evening but no further details available and pt does not remember the event. She typically speaks clearly, per report from staff at her facility. They felt that her speech was more slurred than normal today. Patient is currently oriented to self and [name] but otherwise cannot tell me the year or current president.She appears fatigued. Staff at group home notified ER provider that they were concerned she may have taken a lot of goody powders. She walked by herself to a convenience store earlier on the day of her admission.    In the ED patient admitted to ingesting 70 pills of goody powder with the intent to end her life.  She states she has been having suicidal ideation in the past 3 months and has attempted to commit suicide in the past but would not elaborate on it.  Poison control was contacted with recommendation for bicarb drip, electrolytes replacement, repeat BMP and salicylate levels every 3 hours until metabolic acidosis and salicylate level normalize.  12/30/20: Patient was seen and examined at her bedside while in the ED.  She reports having persistent suicidal thoughts.  She wants to get help and states she would stay to be treated .  One to one sitter ordered.  Psychiatry will see in consultation. We appreciate psychiatry consultative services.  Will hold off on IVC for now unless the patient attempts to leave  against medical advice.  Assessment/Plan: Principal Problem:   AKI (acute kidney injury) (HCC) Active Problems:   HYPERTENSION, BENIGN ESSENTIAL   Schizophrenia, paranoid type (HCC)   Hypokalemia   Dysarthria   Overdose of salicylate  Suicidal attempt with overdose of salicylates Salicylates poisoning and intoxication In the ED patient admitted to ingesting 70 pills of goody powder with the intent to end her life.   Suicidal ideation in the past 3 months with previous attempts  Poison control contacted, provided recommendations: Continue bicarb drip, electrolytes replacement Repeat BMP and salicylate levels every 3 hours until metabolic acidosis and salicylate level normalize. Continue 1-to-1 sitter for patient's own safety. Suicidal precautions Psychiatry will see in consultation. We appreciate psychiatry consultative services. Will hold off on IVC for now unless the patient attempts to leave against medical advice.  Nonoliguric AKI (acute kidney injury) likely prerenal in the setting of dehydration from poor oral intake. Baseline creatinine appears to be 0.7 with GFR greater than 60. Presented with creatinine of 1.4 with GFR 39 Creatinine is downtrending 1.1 with GFR 55. She is currently on bicarb drip, continue Closely monitor urine output.  Hypokalemia likely secondary to bicarb drip infusion While on bicarb drip continue scheduled potassium replacement. Serum potassium 3.1, goal 4.0. Repleted with p.o. KCl 40 mEq x 2 doses x2 days while on bicarb drip. We will check serum potassium after repletion. Serum magnesium 2.0, at goal.  Dysarthria with prior history of CVA. Ongoing dysarthria noted on exam. MRI brain done on 12/30/2020 revealed no acute or reversible findings.  Chronic small vessel disease and remote right PCA distribution infarct.  Resume home aspirin and Lipitor.  Resolved anion gap antibiotic acidosis in the setting of salicylates overdose. Presented with  serum bicarb 17 with anion gap of 15 Improved on bicarb drip. Latest serum bicarb 24 and anion gap of 8.  Resolved sinus tachycardia in the setting of dehydration Presented with HR 110 Sinus tachycardia on admission 12 lead EKG rate 110, QTC 440 Repeat 12 lead EKG this AM.  Schizophrenia, paranoid type  Bipolar disorder Chronic anxiety/depression Resume home regimen Psychiatry will see in consultation.  We appreciate psychiatry consultative services.    Hyperlipidemia Resume home Lipitor.     Code Status: Full code.  Family Communication: None at bedside.  Disposition Plan: Pending psychiatry evaluation and decision on placement.   Consultants:  Psychiatry  Procedures:  None  Antimicrobials:  None  DVT prophylaxis:  SCDs  Status is: Observation    Dispo:  Patient From: Home  Planned Disposition: Home              Anticipated discharge date 12/31/2020 or when clinically stable.  Medically stable for discharge: No, ongoing management of salicylates overdose and intoxication       Objective: Vitals:   12/30/20 0300 12/30/20 0545 12/30/20 0800 12/30/20 0852  BP: 107/78 (!) 138/107 131/72 104/60  Pulse: 93 99 84 84  Resp: 20 20 14    Temp:   98.2 F (36.8 C) 98.7 F (37.1 C)  TempSrc:   Oral   SpO2: 98% 98% 98% (!) 88%    Intake/Output Summary (Last 24 hours) at 12/30/2020 1212 Last data filed at 12/30/2020 0750 Gross per 24 hour  Intake 323.33 ml  Output 400 ml  Net -76.67 ml   There were no vitals filed for this visit.  Exam:  . General: 70 y.o. year-old female well developed well nourished in no acute distress.  Alert and oriented x1.  Slurring of speech. . Cardiovascular: Regular rate and rhythm with no rubs or gallops.  No thyromegaly or JVD noted.   Marland Kitchen Respiratory: Clear to auscultation with no wheezes or rales. Good inspiratory effort. . Abdomen: Soft nontender nondistended with normal bowel sounds x4 quadrants. . Musculoskeletal: No  lower extremity edema. 2/4 pulses in all 4 extremities. . Skin: No ulcerative lesions noted or rashes. . Psychiatry: Flat affect.   Data Reviewed: CBC: Recent Labs  Lab 12/29/20 1800 12/29/20 2006  WBC 7.7  --   NEUTROABS 3.8  --   HGB 13.7 14.3  HCT 40.8 42.0  MCV 89.1  --   PLT 317  --    Basic Metabolic Panel: Recent Labs  Lab 12/29/20 1800 12/29/20 1925 12/29/20 2006 12/30/20 0318 12/30/20 0919  NA 136  --  139 136 136  K 3.5  --  3.1* 3.5 3.1*  CL 104  --   --  106 104  CO2 17*  --   --  18* 24  GLUCOSE 108*  --   --  91 135*  BUN 32*  --   --  33* 24*  CREATININE 1.47*  --   --  1.36* 1.10*  CALCIUM 8.8*  --   --  8.4* 8.3*  MG  --  2.0  --   --   --    GFR: CrCl cannot be calculated (Unknown ideal weight.). Liver Function Tests: Recent Labs  Lab 12/29/20 1800  AST 41  ALT 18  ALKPHOS 61  BILITOT 0.8  PROT 7.0  ALBUMIN 3.7   No results for input(s): LIPASE, AMYLASE  in the last 168 hours. No results for input(s): AMMONIA in the last 168 hours. Coagulation Profile: Recent Labs  Lab 12/29/20 2050  INR 1.4*   Cardiac Enzymes: No results for input(s): CKTOTAL, CKMB, CKMBINDEX, TROPONINI in the last 168 hours. BNP (last 3 results) No results for input(s): PROBNP in the last 8760 hours. HbA1C: No results for input(s): HGBA1C in the last 72 hours. CBG: No results for input(s): GLUCAP in the last 168 hours. Lipid Profile: No results for input(s): CHOL, HDL, LDLCALC, TRIG, CHOLHDL, LDLDIRECT in the last 72 hours. Thyroid Function Tests: No results for input(s): TSH, T4TOTAL, FREET4, T3FREE, THYROIDAB in the last 72 hours. Anemia Panel: No results for input(s): VITAMINB12, FOLATE, FERRITIN, TIBC, IRON, RETICCTPCT in the last 72 hours. Urine analysis:    Component Value Date/Time   COLORURINE YELLOW 12/29/2020 2228   APPEARANCEUR HAZY (A) 12/29/2020 2228   LABSPEC 1.027 12/29/2020 2228   PHURINE 5.0 12/29/2020 2228   GLUCOSEU NEGATIVE 12/29/2020  2228   HGBUR NEGATIVE 12/29/2020 2228   HGBUR negative 10/25/2008 0936   BILIRUBINUR NEGATIVE 12/29/2020 2228   KETONESUR NEGATIVE 12/29/2020 2228   PROTEINUR 30 (A) 12/29/2020 2228   UROBILINOGEN 0.2 03/23/2018 1531   NITRITE NEGATIVE 12/29/2020 2228   LEUKOCYTESUR LARGE (A) 12/29/2020 2228   Sepsis Labs: @LABRCNTIP (procalcitonin:4,lacticidven:4)  ) Recent Results (from the past 240 hour(s))  Resp Panel by RT-PCR (Flu A&B, Covid) Nasopharyngeal Swab     Status: None   Collection Time: 12/29/20  7:43 PM   Specimen: Nasopharyngeal Swab; Nasopharyngeal(NP) swabs in vial transport medium  Result Value Ref Range Status   SARS Coronavirus 2 by RT PCR NEGATIVE NEGATIVE Final    Comment: (NOTE) SARS-CoV-2 target nucleic acids are NOT DETECTED.  The SARS-CoV-2 RNA is generally detectable in upper respiratory specimens during the acute phase of infection. The lowest concentration of SARS-CoV-2 viral copies this assay can detect is 138 copies/mL. A negative result does not preclude SARS-Cov-2 infection and should not be used as the sole basis for treatment or other patient management decisions. A negative result may occur with  improper specimen collection/handling, submission of specimen other than nasopharyngeal swab, presence of viral mutation(s) within the areas targeted by this assay, and inadequate number of viral copies(<138 copies/mL). A negative result must be combined with clinical observations, patient history, and epidemiological information. The expected result is Negative.  Fact Sheet for Patients:  BloggerCourse.com  Fact Sheet for Healthcare Providers:  SeriousBroker.it  This test is no t yet approved or cleared by the [place] FDA and  has been authorized for detection and/or diagnosis of SARS-CoV-2 by FDA under an Emergency Use Authorization (EUA). This EUA will remain  in effect (meaning this test can be used)  for the duration of the COVID-19 declaration under Section 564(b)(1) of the Act, 21 U.S.C.section 360bbb-3(b)(1), unless the authorization is terminated  or revoked sooner.       Influenza A by PCR NEGATIVE NEGATIVE Final   Influenza B by PCR NEGATIVE NEGATIVE Final    Comment: (NOTE) The Xpert Xpress SARS-CoV-2/FLU/RSV plus assay is intended as an aid in the diagnosis of influenza from Nasopharyngeal swab specimens and should not be used as a sole basis for treatment. Nasal washings and aspirates are unacceptable for Xpert Xpress SARS-CoV-2/FLU/RSV testing.  Fact Sheet for Patients: BloggerCourse.com  Fact Sheet for Healthcare Providers: SeriousBroker.it  This test is not yet approved or cleared by the [place] FDA and has been authorized for detection and/or diagnosis of  SARS-CoV-2 by FDA under an Emergency Use Authorization (EUA). This EUA will remain in effect (meaning this test can be used) for the duration of the COVID-19 declaration under Section 564(b)(1) of the Act, 21 U.S.C. section 360bbb-3(b)(1), unless the authorization is terminated or revoked.  Performed at [facility], [address] [place], [place] [address]       Studies: CT Head Wo Contrast  Result Date: 12/29/2020 CLINICAL DATA:  Delirium.  Fall last night at facility. EXAM: CT HEAD WITHOUT CONTRAST TECHNIQUE: Contiguous axial images were obtained from the base of the skull through the vertex without intravenous contrast. COMPARISON:  Head CT 03/21/2019 FINDINGS: Brain: No intracranial hemorrhage, mass effect, or midline shift. Remote infarct involving the right posterior temporal and occipital lobe. Similar degree of chronic small vessel ischemia from prior. Incidental bilateral basal gangliar mineralization, likely senescent. No hydrocephalus. The basilar cisterns are patent. No evidence of territorial infarct or acute ischemia. No extra-axial  or intracranial fluid collection. Vascular: Atherosclerosis of skullbase vasculature without hyperdense vessel or abnormal calcification. Skull: No fracture or focal lesion. Sinuses/Orbits: Paranasal sinuses and mastoid air cells are clear. The visualized orbits are unremarkable. Other: None. IMPRESSION: 1. No acute intracranial abnormality. 2. Remote right posterior temporal and occipital lobe infarct. 3. Unchanged chronic small vessel ischemia. Electronically Signed   By: Narda Rutherford M.D.   On: 12/29/2020 17:57   MR BRAIN WO CONTRAST  Result Date: 12/30/2020 CLINICAL DATA:  Slurred speech EXAM: MRI HEAD WITHOUT CONTRAST TECHNIQUE: Multiplanar, multiecho pulse sequences of the brain and surrounding structures were obtained without intravenous contrast. COMPARISON:  Head CT from yesterday FINDINGS: Brain: No acute infarction, hemorrhage, hydrocephalus, extra-axial collection or mass lesion. Remote right PCA distribution infarct affecting the posterior temporal and occipital lobes inferiorly. Chronic small vessel ischemic type FLAIR hyperintensity in the deep cerebral white matter. Vascular: Normal flow voids. Skull and upper cervical spine: Prominent degenerative facet spurring on the right. Sinuses/Orbits: Negative Other: Motion degraded to the degree that small or subtle findings could be obscured. IMPRESSION: 1. No acute or reversible finding. 2. Chronic small vessel disease and remote right PCA distribution infarct. Electronically Signed   By: Marnee Spring M.D.   On: 12/30/2020 04:26   DG Chest Portable 1 View  Result Date: 12/29/2020 CLINICAL DATA:  Altered mental status. EXAM: PORTABLE CHEST 1 VIEW COMPARISON:  April 30, 2020 FINDINGS: Mild, chronic appearing increased interstitial lung markings are seen. A stable ovoid opacity is seen overlying the infrahilar region on the right. There is no evidence of a pleural effusion or pneumothorax. The cardiac silhouette is mildly enlarged. Degenerative  changes seen throughout the thoracic spine. IMPRESSION: Stable exam without evidence of acute or active cardiopulmonary disease. Electronically Signed   By: Aram Candela M.D.   On: 12/29/2020 17:51   DG Abd Portable 1 View  Result Date: 12/30/2020 CLINICAL DATA:  Evaluation prior to MRI, assess for metallic foreign bodies EXAM: PORTABLE ABDOMEN - 1 VIEW COMPARISON:  11/16/2007 FINDINGS: Supine frontal view of the abdomen and pelvis was obtained, excluding the left flank and superficial soft tissues of the right lateral abdominal wall by collimation. Bowel gas pattern is unremarkable. No masses or abnormal calcifications. No metallic foreign bodies identified within the visualized structures. No acute bony abnormalities. IMPRESSION: 1. No evidence of metallic foreign body within the visualized portions of the abdomen or pelvis. 2. Unremarkable bowel gas pattern. Electronically Signed   By: Sharlet Salina M.D.   On: 12/30/2020 03:45  Scheduled Meds: . potassium chloride  40 mEq Oral BID    Continuous Infusions: . sodium bicarbonate in D5W 1000 mL infusion 250 mL/hr at 12/30/20 0751     LOS: 0 days     Darlin Drop, MD [facility] Pager [phone]  If 7PM-7AM, please contact night-coverage [email or web address] Password [email or web address] 12/30/2020, 12:12 PM

## 2020-12-30 NOTE — TOC Initial Note (Addendum)
Transition of Care [facility]) - Initial/Assessment Note    Patient Details  Name: Danielle [name] MRN: [id number] Date of Birth: March 19, 1951  [facility]) CM/SW Contact:    Joanne Chars, LCSW Phone Number: 12/30/2020, 2:52 PM  Clinical Narrative: CSW met with pt and daughter Verdene Lennert to complete assessment. Daughter reports she is POA.   Discussed psychiatry recommendation for inpt gero psych placement.  Pt lives at [facility], daughter not happy that facility continues to allow pt to leave the property.  Daughter planning to pursue new placement after discharge from psych facility.  CSW encouraged her to be careful before giving up bed at [facility] as other beds can be difficult to secure with pt history.   Pt is vaccinated and boosted.    1500: Pt faxed out to following geropsych facilities: [facility]                 Expected Discharge Plan: Psychiatric Hospital Barriers to Discharge: Other (comment),Continued Medical Work up (pending geropsych bed)   Patient Goals and CMS Choice        Expected Discharge Plan and Services Expected Discharge Plan: [facility] In-house Referral: Clinical Social Work                                            Prior Living Arrangements/Services   Lives with:: Facility Resident ([place]) Patient language and need for interpreter reviewed:: Yes        Need for Family Participation in Patient Care: Yes (Comment) Care giver support system in place?: Yes (comment) Current home services: Other (comment) (none) Criminal Activity/Legal Involvement Pertinent to Current Situation/Hospitalization: No - Comment as needed  Activities of Daily Living      Permission Sought/Granted Permission sought to share information with : Family Supports Permission granted to share information with : Yes, Verbal Permission Granted  Share Information  with NAME: daughter, Verdene Lennert           Emotional Assessment Appearance:: Appears stated age Attitude/Demeanor/Rapport: Engaged Affect (typically observed): Pleasant Orientation: : Oriented to Self,Oriented to Place,Oriented to  Time,Oriented to Situation Alcohol / Substance Use: Alcohol Use,Illicit Drugs Psych Involvement: Yes (comment)  Admission diagnosis:  AKI (acute kidney injury) ([place]) [J50.0] Salicylate overdose, undetermined intent, initial encounter [T39.094A] Patient Active Problem List   Diagnosis Date Noted  . Major depressive disorder, recurrent episode, moderate ([place])   . Dysarthria 12/29/2020  . AKI (acute kidney injury) ([place]) 12/29/2020  . Overdose of salicylate 93/81/8299  . CAD (coronary artery disease) 03/22/2019  . Syncope 03/21/2019  . Unstable angina ([place])   . Abnormal nuclear stress test   . Chest pain 08/13/2018  . Hypokalemia 08/11/2018  . Tobacco abuse 08/11/2018  . Chest pain with high risk for cardiac etiology 08/10/2018  . Memory disturbance 01/30/2016  . Acute encephalopathy 05/23/2014  . Altered mental status 12/08/2013  . Schizophrenia, paranoid type ([place]) 04/28/2012  . Cocaine abuse ([place]) 04/28/2012  . Cannabis abuse 04/28/2012  . Substance induced mood disorder ([place]) 04/28/2012  . Noncompliance 09/16/2011  . Anxiety 09/16/2011  . Constipation 09/16/2011  . Chest pain 09/14/2011  . SUBSTANCE ABUSE, MULTIPLE 12/03/2008  . HYPERLIPIDEMIA 08/01/2008  . MIGRAINE HEADACHE 08/01/2008  . ALLERGIC RHINITIS WITH CONJUNCTIVITIS 08/01/2008  . ASTHMA 08/01/2008  . DEPRESSION 06/20/2008  . URI 06/20/2008  .  ANXIETY DISORDER, GENERALIZED 05/28/2008  . HYPERTENSION, BENIGN ESSENTIAL 05/28/2008  . GERD 05/28/2008  . BACTERIAL VAGINITIS 05/24/2008  . DEEP VENOUS THROMBOPHLEBITIS, LEFT, LEG, HX OF 10/18/1988   PCP:  [facility] [place]:   [facility] [place], [place] - [address] [place] [address] Phone: [phone] Fax: [phone]  [facility] [place], [place] - [address] [place] [address] Phone: [phone] Fax: [phone]     Social Determinants of Health (SDOH) Interventions    Readmission Risk Interventions No flowsheet data found.

## 2020-12-30 NOTE — ED Notes (Signed)
Patient bed linen changed from soiled urine. Patient placed on bedside commode emptied and 400 ml emptied from purewick

## 2020-12-30 NOTE — ED Notes (Signed)
BMP & Salicylate 3 hrs .Marland KitchenMarland KitchenMarland Kitchen

## 2020-12-30 NOTE — Plan of Care (Addendum)
Patient admitted with attempted suicide and overdose. Sitter remains at bedside. Patient denies pain or discomfort at this time.Suicide precautions maintained. Personal items, shirt, pants underwear, coat and pink denture cup with brown  Earrings, no dentures were removed from room.

## 2020-12-30 NOTE — ED Notes (Signed)
MD spoke to pt and pt states she is suicidal with multiple attempts in the past to OD. SI precautions initiated. Sherry,RN informed of situation that pt will need a Comptroller. 2W is Set designer for a sitter.

## 2020-12-30 NOTE — ED Notes (Signed)
Poison Control called and stated they want BMP and Salicylate level every 3 hours and Sodium Bicarbonate with Dextrose needs to be increased to . MD made aware via email, new orders completed.

## 2020-12-30 NOTE — Consult Note (Addendum)
[facility] Face-to-Face Psychiatry Consult   Reason for Consult: Suicide attempt with 70 pills of Goody powder  Referring Physician:  Darlin Drop, DO  Patient Identification: [name] MRN:  [id number] Principal Diagnosis: AKI (acute kidney injury) (HCC) Diagnosis:  Principal Problem:   AKI (acute kidney injury) (HCC) Active Problems:   HYPERTENSION, BENIGN ESSENTIAL   Schizophrenia, paranoid type (HCC)   Hypokalemia   Dysarthria   Overdose of salicylate   Total Time spent with patient: 25 minutes  HPI:   Danielle [name] is a 70 y.o. female with PMHx of polysubstance abuse, multiple psychiatric diagnoses (schizoaffective disorder-bipolar type, paranoid schizophrenia, major depressive disorder, generalized anxiety disorder, tardive dyskinesia ), memory disturbance, unstable angina, syncope presented to [facility] ED via EMS for evaluation of slurred speech.  [facility] [place] [facility] [place] [facility] [place].  Per EMS, they were called due to patient slurred speech and were told that at baseline she speaks clearly.  Staff at group home notified the ER provider that they were concerned that she might take a lot of daily powder as she walked by herself to a convenience store.  Poison Control was called and they wanted BMP and salicylate level every 3 hours and sodium bicarbonate with dextrose~250 mL.  Patient is still suicidal with multiple attempts in past to already and psychiatry is consulted for further evaluation of this patient. Today patient denies any suicidal ideations or homicidal ideations.  Patient denies any auditory hallucinations but states she sees " a white spirit" who is asking her to " beat her ass up" but she is choosing not to hear her.  She states she wanted to take her life because she does not feel well but at this point she would like to take her Seroquel and Ativan.  She states " I need something to calm down my nerves".  She states she has attempted attempted suicide multiple  times in her life and would not like to go into details as she has lost count.  She states she would not like to talk about her family members and would just like to take rest at this point.  Collateral information: From daughter [name] via telephone.  She states patient lives in nursing home, has her own Child psychotherapist and the nurse and she does not understand how she went outside and report all the [employer] powder.  She believes patient needs more psychiatric help.  She states she has been admitted to [facility] for a month couple of years ago and [facility] as well.  Past Psychiatric History: Schizoaffective disorder, bipolar type, ?  Paranoid schizophrenia, ?  Major depressive disorder, generalized anxiety disorder, tardive dyskinesia.  She was admitted at [facility] for a month for cocaine abuse in 2018 and in [facility] in 2013 for paranoid schizophrenia.  Reports multiple suicide attempt by OD in the past.  Risk to Self:  No Risk to Others:  No Prior Inpatient Therapy:  Yes Prior Outpatient Therapy:  Yes  Past Medical History:  Past Medical History:  Diagnosis Date  . Asthma   . Bipolar affective disorder, currently depressed, mild (HCC)   . Chest pain   . Complication of anesthesia    " My eyes swell up "  . Coronary artery disease   . Depression   . Fall 08/25/2012   Recent fall with residual knee and back pain.  . Family history of anesthesia complication    " [name] "  . Hypertension   .  Memory disturbance 01/30/2016  . Stroke (HCC)   . Syncope and collapse 03/21/2019  . Tardive dyskinesia   . Tobacco abuse     Past Surgical History:  Procedure Laterality Date  . ABDOMINAL HYSTERECTOMY    . CARDIAC CATHETERIZATION    . RIGHT/LEFT HEART CATH AND CORONARY ANGIOGRAPHY N/A 08/14/2018   Procedure: RIGHT/LEFT HEART CATH AND CORONARY ANGIOGRAPHY;  Surgeon: Marykay LexHarding, David W, MD;  Location[facility] INVASIVE CV LAB;  Service: Cardiovascular;  Laterality: N/A;    Family History:  Family History  Problem Relation Age of Onset  . Hypertension Mother   . Stroke Mother   . Heart attack Mother   . Heart attack Father   . Heart attack Brother   . Heart attack Sister   . Dementia Neg Hx    Family Psychiatric  History: Patient denies to share any family psychiatric history Social History: [place].  She is divorced with 2 daughters Social History   Substance and Sexual Activity  Alcohol Use Not Currently  . Alcohol/week: 0.0 standard drinks   Comment: About 1 qt per week     Social History   Substance and Sexual Activity  Drug Use Not Currently  . Types: Cocaine, Marijuana   Comment: Former user    Social History   Socioeconomic History  . Marital status: Divorced    Spouse name: Not on file  . Number of children: 2  . Years of education: 3812  . Highest education level: Not on file  Occupational History  . Occupation: Retired Advertising copywriterhousekeeper  Tobacco Use  . Smoking status: Former Smoker    Packs/day: 1.00    Types: Cigarettes    Quit date: 08/21/2018    Years since quitting: 2.3  . Smokeless tobacco: Never Used  Vaping Use  . Vaping Use: Never used  Substance and Sexual Activity  . Alcohol use: Not Currently    Alcohol/week: 0.0 standard drinks    Comment: About 1 qt per week  . Drug use: Not Currently    Types: Cocaine, Marijuana    Comment: Former user  . Sexual activity: Not Currently  Other Topics Concern  . Not on file  Social History Narrative   Lives at home alone   Can use both hands   Denies caffeine use   Social Determinants of Health   Financial Resource Strain: Not on file  Food Insecurity: Not on file  Transportation Needs: Not on file  Physical Activity: Not on file  Stress: Not on file  Social Connections: Not on file    Allergies:   Allergies  Allergen Reactions  . Clonazepam Swelling    Pt was recently given a triamterene hydrochlorothiazide combination as  well as clonazepam and had an allergic reaction to one of them. Caused swelling of face and eyes  . Doxycycline Swelling    Swelling of face and eyes  . Fosinopril Sodium Swelling     swelling face,lips-angioedema from ACE I  . Hydrochlorothiazide W-Triamterene Swelling    Pt was recently given a triamterene hydrochlorothiazide combination as well as clonazepam and had an allergic reaction to one of them. Swelling of face and eyes  . Penicillins Swelling    Face and eyes Has patient had a PCN reaction causing immediate rash, facial/tongue/throat swelling, SOB or lightheadedness with hypotension: Yes Has patient had a PCN reaction causing severe rash involving mucus membranes or skin necrosis: No Has patient had a PCN reaction that required hospitalization: No  Has patient had a PCN reaction occurring within the last 10 years: No If all of the above answers are "NO", then may proceed with Cephalosporin use.   . Sulfa Antibiotics Other (See Comments)    Unknown reaction    Labs:  CBC Latest Ref Rng & Units 12/29/2020 12/29/2020 04/28/2019  WBC 4.0 - 10.5 K/uL - 7.7 5.8  Hemoglobin 12.0 - 15.0 g/dL 76.8 08.8 11.0  Hematocrit 36.0 - 46.0 % 42.0 40.8 39.7  Platelets 150 - 400 K/uL - 317 252   BMP Latest Ref Rng & Units 12/30/2020 12/30/2020 12/30/2020  Glucose 70 - 99 mg/dL 315(X) 458(P) 91  BUN 8 - 23 mg/dL 20 92(T) 24(M)  Creatinine 0.44 - 1.00 mg/dL 6.28(M) 3.81(R) 7.11(A)  Sodium 135 - 145 mmol/L 138 136 136  Potassium 3.5 - 5.1 mmol/L 2.9(L) 3.1(L) 3.5  Chloride 98 - 111 mmol/L 103 104 106  CO2 22 - 32 mmol/L 26 24 18(L)  Calcium 8.9 - 10.3 mg/dL 8.0(L) 8.3(L) 8.4(L)    Current Facility-Administered Medications  Medication Dose Route Frequency Provider Last Rate Last Admin  .  stroke: mapping our early stages of recovery book   Does not apply Once Chotiner, Claudean Severance, MD      . acetaminophen (TYLENOL) tablet 650 mg  650 mg Oral Q4H PRN Chotiner, Claudean Severance, MD       Or  .  acetaminophen (TYLENOL) 160 MG/5ML solution 650 mg  650 mg Per Tube Q4H PRN Chotiner, Claudean Severance, MD       Or  . acetaminophen (TYLENOL) suppository 650 mg  650 mg Rectal Q4H PRN Chotiner, Claudean Severance, MD      . albuterol (PROVENTIL) (2.5 MG/3ML) 0.083% nebulizer solution 2.5 mg  2.5 mg Inhalation Q6H PRN Chotiner, Claudean Severance, MD      . amLODipine (NORVASC) tablet 2.5 mg  2.5 mg Oral Daily Chotiner, Claudean Severance, MD      . atorvastatin (LIPITOR) tablet 20 mg  20 mg Oral q1800 Chotiner, Claudean Severance, MD      . carvedilol (COREG) tablet 6.25 mg  6.25 mg Oral BID WC Chotiner, Claudean Severance, MD      . lactated ringers infusion   Intravenous Continuous Chotiner, Claudean Severance, MD      . potassium chloride SA (KLOR-CON) CR tablet 40 mEq  40 mEq Oral BID Hall, Carole N, DO      . QUEtiapine (SEROQUEL) tablet 100 mg  100 mg Oral Daily Chotiner, Claudean Severance, MD      . senna-docusate (Senokot-S) tablet 1 tablet  1 tablet Oral QHS PRN Chotiner, Claudean Severance, MD      . sertraline (ZOLOFT) tablet 50 mg  50 mg Oral Daily Chotiner, Claudean Severance, MD      . sodium bicarbonate 100 mEq in dextrose 5 % 1,000 mL infusion   Intravenous Continuous Darlin Drop, DO 250 mL/hr at 12/30/20 0751 New Bag at 12/30/20 0751    Musculoskeletal: Strength & Muscle Tone: within normal limits Gait & Station: normal Patient leans: N/A  Psychiatric Specialty Exam: Physical Exam Vitals and nursing note reviewed.  Constitutional:      Appearance: Normal appearance.  HENT:     Head: Normocephalic and atraumatic.     Mouth/Throat:     Mouth: Mucous membranes are moist.  Cardiovascular:     Rate and Rhythm: Normal rate and regular rhythm.     Pulses: Normal pulses.  Musculoskeletal:     Cervical back: Normal range of  motion.  Neurological:     Mental Status: She is alert and oriented to person, place, and time.     Review of Systems  Blood pressure 104/60, pulse 84, temperature 98.7 F (37.1 C), resp. rate 14, SpO2 (!) 88 %.There is no height  or weight on file to calculate BMI.  General Appearance: Casual  Eye Contact:  Fair  Speech:  Normal Rate  Volume:  Normal  Mood:  Dysphoric and Irritable  Affect:  Constricted  Thought Process:  Goal Directed and Descriptions of Associations: Circumstantial  Orientation:  Full (Time, Place, and Person)  Thought Content:  Hallucinations: Visual  Suicidal Thoughts:  No  Homicidal Thoughts:  No  Memory:  Immediate;   Fair Recent;   Fair Remote;   Fair  Judgement:  Impaired  Insight:  Lacking  Psychomotor Activity:  Normal  Concentration:  Concentration: Fair and Attention Span: Fair  Recall:  [employer] of Knowledge:  Fair  Language:  Fair  Akathisia:  Seems like tardive dyskinesia  Handed:  Right  AIMS (if indicated):     Assets:  Resilience  ADL's:  Impaired  Cognition:  Impaired,  Mild  Sleep:      Assessment: 70 year old female with past psychiatric history of polysubstance abuse, schizoaffective disorder bipolar type, major depressive disorder, generalized anxiety disorder, tardive dyskinesia admitted after suicide attempt by overdose on acetaminophen (Goody powder) -On evaluation today patient denies any suicidal or homicidal ideation.  She admits to visual hallucination, stated this has been going on for a long time.  She admits to this being a suicidal attempt but states does not feel suicidal now.  Patient has history of multiple past psychiatric hospitalization for psychosis, multiple suicide attempts and substance abuse like cocaine, marijuana. -Poison control was called and they recommended repeating BMP and salicylate level every 3 hours.  Last salicylate level was 34.1, trending down.  Estimates 2.9, glucose 157, creatinine 1.03, calcium 8, GFR 60.  UDS is negative.QTC 404 -Patient would benefit from restarting her home medication as we do not have any idea whether she was taking her medications or not.  Social worker/pharmacist assistance provided in the situation.   Patient came after a suicide attempt, still psychotic, med noncompliance patient meets the criteria for inpatient psychiatric hospitalization and would benefit from geriatric psychiatric hospitalization.  Treatment Plan: --Restart Zoloft 50 mg daily for depression --Start Seroquel 100 mg nightly for psychosis --Continue 1:1 sitter for patient safety --Plan discussed with primary team --Appreciate social worker assistance with placement --Daily contact with patient to assess and evaluate symptoms and progress in treatment --Psychiatry will continue to follow  Disposition:  --Recommend gero-psychiatric inpatient admission when medically cleared.  Arnoldo Lenis, MD 12/30/2020 1:36 PM  PGY-1, Resident

## 2020-12-31 DIAGNOSIS — T39094A Poisoning by salicylates, undetermined, initial encounter: Secondary | ICD-10-CM | POA: Diagnosis not present

## 2020-12-31 DIAGNOSIS — N179 Acute kidney failure, unspecified: Secondary | ICD-10-CM

## 2020-12-31 DIAGNOSIS — F331 Major depressive disorder, recurrent, moderate: Secondary | ICD-10-CM

## 2020-12-31 DIAGNOSIS — F200 Paranoid schizophrenia: Secondary | ICD-10-CM

## 2020-12-31 LAB — BASIC METABOLIC PANEL
Anion gap: 6 (ref 5–15)
Anion gap: 7 (ref 5–15)
BUN: 10 mg/dL (ref 8–23)
BUN: 16 mg/dL (ref 8–23)
CO2: 29 mmol/L (ref 22–32)
CO2: 30 mmol/L (ref 22–32)
Calcium: 8.5 mg/dL — ABNORMAL LOW (ref 8.9–10.3)
Calcium: 8.7 mg/dL — ABNORMAL LOW (ref 8.9–10.3)
Chloride: 104 mmol/L (ref 98–111)
Chloride: 107 mmol/L (ref 98–111)
Creatinine, Ser: 0.75 mg/dL (ref 0.44–1.00)
Creatinine, Ser: 0.84 mg/dL (ref 0.44–1.00)
GFR, Estimated: >60 mL/min (ref >60)
GFR, Estimated: >60 mL/min (ref >60)
Glucose, Bld: 101 mg/dL — ABNORMAL HIGH (ref 70–99)
Glucose, Bld: 85 mg/dL (ref 70–99)
Potassium: 3.5 mmol/L (ref 3.5–5.1)
Potassium: 3.6 mmol/L (ref 3.5–5.1)
Sodium: 141 mmol/L (ref 135–145)
Sodium: 142 mmol/L (ref 135–145)

## 2020-12-31 LAB — CBC
HCT: 34.8 % — ABNORMAL LOW (ref 36.0–46.0)
Hemoglobin: 12.2 g/dL (ref 12.0–15.0)
MCH: 31.1 pg (ref 26.0–34.0)
MCHC: 35.1 g/dL (ref 30.0–36.0)
MCV: 88.8 fL (ref 80.0–100.0)
Platelets: 260 10*3/uL (ref 150–400)
RBC: 3.92 MIL/uL (ref 3.87–5.11)
RDW: 14.6 % (ref 11.5–15.5)
WBC: 5.3 10*3/uL (ref 4.0–10.5)
nRBC: 0 % (ref 0.0–0.2)

## 2020-12-31 LAB — RESP PANEL BY RT-PCR (FLU A&B, COVID) ARPGX2
Influenza A by PCR: NEGATIVE
Influenza B by PCR: NEGATIVE
SARS Coronavirus 2 by RT PCR: NEGATIVE

## 2020-12-31 LAB — LIPID PANEL
Cholesterol: 152 mg/dL (ref 0–200)
HDL: 53 mg/dL (ref >40)
LDL Cholesterol: 83 mg/dL (ref 0–99)
Total CHOL/HDL Ratio: 2.9 RATIO
Triglycerides: 82 mg/dL (ref <150)
VLDL: 16 mg/dL (ref 0–40)

## 2020-12-31 LAB — SALICYLATE LEVEL
Salicylate Lvl: 14 mg/dL (ref 7.0–30.0)
Salicylate Lvl: <7 mg/dL — ABNORMAL LOW (ref 7.0–30.0)

## 2020-12-31 MED ORDER — NICOTINE 21 MG/24HR TD PT24
21.0000 mg | MEDICATED_PATCH | Freq: Every day | TRANSDERMAL | Status: DC
  Administered 2020-12-31: 21 mg via TRANSDERMAL
  Filled 2020-12-31: qty 1

## 2020-12-31 MED ORDER — POTASSIUM CHLORIDE CRYS ER 20 MEQ PO TBCR
40.0000 meq | EXTENDED_RELEASE_TABLET | Freq: Once | ORAL | Status: AC
  Administered 2020-12-31: 40 meq via ORAL
  Filled 2020-12-31: qty 2

## 2020-12-31 NOTE — Evaluation (Signed)
Speech Language Pathology Evaluation Patient Details Name: Danielle [name] MRN: [id number] DOB: 1951/07/31 Today's Date: 12/31/2020 Time: 1125-1140 SLP Time Calculation (min) (ACUTE ONLY): 14.9 min  Problem List:  Patient Active Problem List   Diagnosis Date Noted  . Major depressive disorder, recurrent episode, moderate (HCC)   . Dysarthria 12/29/2020  . AKI (acute kidney injury) (HCC) 12/29/2020  . Overdose of salicylate 12/29/2020  . CAD (coronary artery disease) 03/22/2019  . Syncope 03/21/2019  . Unstable angina (HCC)   . Abnormal nuclear stress test   . Chest pain 08/13/2018  . Hypokalemia 08/11/2018  . Tobacco abuse 08/11/2018  . Chest pain with high risk for cardiac etiology 08/10/2018  . Memory disturbance 01/30/2016  . Acute encephalopathy 05/23/2014  . Altered mental status 12/08/2013  . Schizophrenia, paranoid type (HCC) 04/28/2012  . Cocaine abuse (HCC) 04/28/2012  . Cannabis abuse 04/28/2012  . Substance induced mood disorder (HCC) 04/28/2012  . Noncompliance 09/16/2011  . Anxiety 09/16/2011  . Constipation 09/16/2011  . Chest pain 09/14/2011  . SUBSTANCE ABUSE, MULTIPLE 12/03/2008  . HYPERLIPIDEMIA 08/01/2008  . MIGRAINE HEADACHE 08/01/2008  . ALLERGIC RHINITIS WITH CONJUNCTIVITIS 08/01/2008  . ASTHMA 08/01/2008  . DEPRESSION 06/20/2008  . URI 06/20/2008  . ANXIETY DISORDER, GENERALIZED 05/28/2008  . HYPERTENSION, BENIGN ESSENTIAL 05/28/2008  . GERD 05/28/2008  . BACTERIAL VAGINITIS 05/24/2008  . DEEP VENOUS THROMBOPHLEBITIS, LEFT, LEG, HX OF 10/18/1988   Past Medical History:  Past Medical History:  Diagnosis Date  . Asthma   . Bipolar affective disorder, currently depressed, mild (HCC)   . Chest pain   . Complication of anesthesia    " My eyes swell up "  . Coronary artery disease   . Depression   . Fall 08/25/2012   Recent fall with residual knee and back pain.  . Family history of anesthesia complication    " [name] "  . Hypertension    . Memory disturbance 01/30/2016  . Stroke (HCC)   . Syncope and collapse 03/21/2019  . Tardive dyskinesia   . Tobacco abuse    Past Surgical History:  Past Surgical History:  Procedure Laterality Date  . ABDOMINAL HYSTERECTOMY    . CARDIAC CATHETERIZATION    . RIGHT/LEFT HEART CATH AND CORONARY ANGIOGRAPHY N/A 08/14/2018   Procedure: RIGHT/LEFT HEART CATH AND CORONARY ANGIOGRAPHY;  Surgeon: Marykay Lex, MD;  Location: [facility] INVASIVE CV LAB;  Service: Cardiovascular;  Laterality: N/A;   HPI:  Pt is a 70 y.o. female with medical history significant for polysubstance abuse, memory disturbance, bipolar affective disorder, schizophrenia, unstable angina, syncope, who presented by EMS for evaluation of slurred speech. MRI brain was negative. Pt reported being suicidal on admission. Psych was consulted secondary to overdose on acetaminophen (Goody powder) and the plan is for the pt to transfer to inpatient psych.   Assessment / Plan / Recommendation Clinical Impression  Pt participated in speech/language evaluation with her daughter present. Pt's daughter reported the pt having baseline deficits in memory and problem solving with intermittent "slurred speech" during similar episodes which subsequently subsides. Her speech and language skills are currently within normal limits. She demonstrated cognitive-linguistic deficits in the areas of awareness, memory, orientation, attention, and problem solving which the pt's daughter reported are her baseline. Further skilled SLP services are not clinically indicated at this time. Pt, nursing, and her daughter were educated regarding results and recommendations; all parties verbalized understanding as well as agreement with plan of care.    SLP Assessment  SLP Recommendation/Assessment:  Patient does not need any further Speech Lanaguage Pathology Services SLP Visit Diagnosis: Cognitive communication deficit (R41.841)    Follow Up Recommendations  None     Frequency and Duration           SLP Evaluation Cognition  Overall Cognitive Status: History of cognitive impairments - at baseline Arousal/Alertness: Awake/alert Orientation Level: Oriented to person;Oriented to place;Oriented to situation;Disoriented to time Attention: Focused;Sustained;Selective Focused Attention: Appears intact Sustained Attention: Appears intact Selective Attention: Impaired Selective Attention Impairment: Verbal complex Memory: Impaired Memory Impairment: Retrieval deficit;Decreased recall of new information (Immediate: 3/3; delayed: 0/3; with cues: 0/3) Awareness: Impaired Awareness Impairment: Emergent impairment Problem Solving: Impaired       Comprehension  Auditory Comprehension Overall Auditory Comprehension: Appears within functional limits for tasks assessed Yes/No Questions: Impaired Basic Immediate Environment Questions:  (5/5) Complex Questions:  (2/5) Paragraph Comprehension (via yes/no questions):  (2/4) Commands: Within Functional Limits Conversation: Complex Reading Comprehension Reading Status: Not tested    Expression Expression Primary Mode of Expression: Verbal Verbal Expression Overall Verbal Expression: Appears within functional limits for tasks assessed Initiation: No impairment Automatic Speech: Counting;Day of week;Month of year (WNL) Level of Generative/Spontaneous Verbalization: Conversation Repetition: No impairment Naming: No impairment Responsive:  (5/5) Confrontation: Within functional limits (5/5) Pragmatics: No impairment   Oral / Motor  Oral Motor/Sensory Function Overall Oral Motor/Sensory Function: Within functional limits Motor Speech Overall Motor Speech: Appears within functional limits for tasks assessed Respiration: Within functional limits Phonation: Normal Resonance: Within functional limits Articulation: Within functional limitis Intelligibility: Intelligible Motor Planning: Witnin functional  limits Motor Speech Errors: Not applicable   Shanika I. Vear Clock, MS, CCC-SLP Acute Rehabilitation Services Office number [phone] Pager [phone]                   Scheryl Marten 12/31/2020, 12:17 PM

## 2020-12-31 NOTE — TOC Transition Note (Addendum)
Transition of Care [facility]) - CM/SW Discharge Note   Patient Details  Name: Danielle [name] MRN: [id number] Date of Birth: 1951-10-12  Transition of Care [facility]) CM/SW Contact:  Lorri Frederick, LCSW Phone Number: 12/31/2020, 3:46 PM   Clinical Narrative:  Pt discharging back to [facility].  RN call report to [phone].  Hydia and Lurena Joiner were contacts at [facility].  FL2, DC summary, negative covid test faxed and they are ready for her return.  Daughter Suzette Battiest will transport.  Pt was cleared by psychiatry.  CSW spoke with daughter who is also in agreement with pt return.    Final next level of care: Assisted Living Barriers to Discharge: Barriers Resolved   Patient Goals and CMS Choice        Discharge Placement              Patient chooses bed at:  [facility]) Patient to be transferred to facility by: daughter Suzette Battiest Name of family member notified: daughter Suzette Battiest Patient and family notified of of transfer: 12/31/20  Discharge Plan and Services In-house Referral: Clinical Social Work                                   Social Determinants of Health (SDOH) Interventions     Readmission Risk Interventions No flowsheet data found.

## 2020-12-31 NOTE — Evaluation (Signed)
Physical Therapy Evaluation and Discharge Patient Details Name: Danielle [name] MRN: [id number] DOB: Jul 28, 1951 Today's Date: 12/31/2020   History of Present Illness  70 y.o. female with medical history significant for polysubstance abuse, depression, memory disturbance, bipolar affective disorder, schizophrenia, unstable angina, syncope, who presented from [facility] 12/29/20 by EMS for evaluation of slurred speech. ?consumed excessive BC powders and reported she is suicidal; +visual/auditory hallucinations  Clinical Impression   Patient evaluated by Physical Therapy with no further acute PT needs identified. Patient ambulated 180 ft with reports of mild dizziness. BP higher on return to room than her baseline (102/49 to 118/64). She could not describe dizziness with any other words, "just dizzy." Lasted ~ 4 minutes once seated. Unclear etiology. All education has been completed and the patient has no further questions. PT is signing off. Thank you for this referral.     Follow Up Recommendations No PT follow up    Equipment Recommendations  None recommended by PT    Recommendations for Other Services OT consult     Precautions / Restrictions Precautions Precautions: Other (comment) Precaution Comments: pt on suicide precautions      Mobility  Bed Mobility Overal bed mobility: Needs Assistance Bed Mobility: Rolling;Sidelying to Sit;Sit to Supine Rolling: Supervision Sidelying to sit: Supervision   Sit to supine: Independent   General bed mobility comments: pt reports difficulty getting OOB and demonstrates attempt to move supine to sit unsuccessfully; pt exits bed to rt at group home and shown to roll rt and push up from rt elbow. Repeated x 3 for learning with pt requiring cues each time (even after questioning cues with choices given)    Transfers Overall transfer level: Independent                  Ambulation/Gait Ambulation/Gait assistance: Supervision Gait Distance  (Feet): 180 Feet Assistive device: None;Rolling walker (2 wheeled) Gait Pattern/deviations: Step-through pattern;Decreased stride length   Gait velocity interpretation: 1.31 - 2.62 ft/sec, indicative of limited community ambulator General Gait Details: initially with RW, pt appeared steady and reported she does not use RW; remainder without device. Reported mild dizziness and BP on return 118/64 (baseline102/49). Pt with one slight stumble when turning and stepped on her loose sock. Pt recovered without physical assist.  Stairs            Wheelchair Mobility    Modified Rankin (Stroke Patients Only)       Balance Overall balance assessment: Independent (rhomberg eyes closed x 10 sec no sway)                                           Pertinent Vitals/Pain Pain Assessment: No/denies pain    Home Living Family/patient expects to be discharged to:: Group home                 Additional Comments: or psych facility per chart    Prior Function Level of Independence: Independent         Comments: states initially her rollator recently broke; when used RW to walk she denied using walker PTA; later states she recently fell but not sure why (per chart syncope)     Hand Dominance        Extremity/Trunk Assessment   Upper Extremity Assessment Upper Extremity Assessment: Defer to OT evaluation    Lower Extremity Assessment Lower Extremity Assessment: Overall WFL for  tasks assessed    Cervical / Trunk Assessment Cervical / Trunk Assessment: Other exceptions Cervical / Trunk Exceptions: overweight  Communication   Communication: No difficulties  Cognition Arousal/Alertness: Awake/alert Behavior During Therapy: WFL for tasks assessed/performed Overall Cognitive Status: No family/caregiver present to determine baseline cognitive functioning                                 General Comments: appropriate throughout session;  following commands, sequencing for washing hands      General Comments      Exercises     Assessment/Plan    PT Assessment Patent does not need any further PT services  PT Problem List         PT Treatment Interventions      PT Goals (Current goals can be found in the Care Plan section)  Acute Rehab PT Goals Patient Stated Goal: wanted to learn how to get OOB easier PT Goal Formulation: All assessment and education complete, DC therapy    Frequency     Barriers to discharge        Co-evaluation               AM-PAC PT "6 Clicks" Mobility  Outcome Measure Help needed turning from your back to your side while in a flat bed without using bedrails?: None Help needed moving from lying on your back to sitting on the side of a flat bed without using bedrails?: None Help needed moving to and from a bed to a chair (including a wheelchair)?: None Help needed standing up from a chair using your arms (e.g., wheelchair or bedside chair)?: None Help needed to walk in hospital room?: A Little Help needed climbing 3-5 steps with a railing? : None 6 Click Score: 23    End of Session Equipment Utilized During Treatment: Gait belt Activity Tolerance: Treatment limited secondary to medical complications (Comment) (reported dizziness during gait limited distance) Patient left: in chair;with call bell/phone within reach;with nursing/sitter in room Nurse Communication: Mobility status;Other (comment) (may check orthostatics next walk) PT Visit Diagnosis: Difficulty in walking, not elsewhere classified (R26.2)    Time: 1245-8099 PT Time Calculation (min) (ACUTE ONLY): 23 min   Charges:   PT Evaluation $PT Eval Low Complexity: 1 Low PT Treatments $Therapeutic Activity: 8-22 mins         Jerolyn Center, PT Pager [phone]   Zena Amos 12/31/2020, 9:45 AM

## 2020-12-31 NOTE — Discharge Summary (Signed)
Physician Discharge Summary  Danielle [name] FXO:[id number] DOB: 1951-03-14 DOA: 12/29/2020  PCP: [facility]  Admit date: 12/29/2020 Discharge date: 12/31/2020  Admitted From: [facility] Disposition: ALF  Recommendations for Outpatient Follow-up:  1. Follow up with PCP in 1-2 weeks 2. Please obtain BMP/CBC in one week your next doctors visit.  3. Follow-up patient psychiatry in 2-4 weeks   Discharge Condition: Stable CODE STATUS: Full code Diet recommendation: Heart healthy  Brief/Interim Summary: 70 y.o.femalewith medical history significant forchronic depression, polysubstance abuse, memory disturbance, bipolar affective disorder, schizophrenia, unstable angina, syncope, who presentedby EMS from group home for evaluation ofslurred speech.Patient resided in [place].  Patient apparently had ingested 70 pills of Goody powder with intent to end her life.  Upon admission her salicylate levels were elevated, was seen by psychiatry and started on bicarb drip and IV fluids.  Initially was determined she would benefit from inpatient psychiatry hospitalization.  Patient's lab work significantly improved with IV fluids and her bicarb drip was stopped.  Salicylate levels trended down.  On the day of discharge patient denied any suicidal or homicidal ideation.  Patient was seen by psychiatry and cleared for discharge and no longer require inpatient psychiatry admission.  I met with the patient's daughter at bedside on the day of discharge as well  Body mass index is 23.16 kg/m.     Suicidal attempt with overdose of salicylates Salicylates poisoning and intoxication Initial intention was to end her life.  Ingested 70 pills of Goody powder.  Salicylate levels initially elevated.  Needed bicarb drip and IV fluids.  Salicylate levels trended down, bicarb levels improved. Initially required inpatient psychiatry admission as recommended by psychiatry but  eventually cleared for discharge.  Nonoliguric AKI (acute kidney injury) likely prerenal in the setting of dehydration from poor oral intake. Resolved doing well  Hypokalemia likely secondary to bicarb drip infusion Improved  Dysarthria with prior history of CVA. Ongoing dysarthria noted on exam. MRI brain done on 12/30/2020 revealed no acute or reversible findings.  Chronic small vessel disease and remote right PCA distribution infarct. Resume home aspirin and Lipitor.  Resolved anion gap antibiotic acidosis in the setting of salicylates overdose. Resolved with bicarb drip  Resolved sinus tachycardia in the setting of dehydration Resolved  Schizophrenia, paranoid type  Bipolar disorder Chronic anxiety/depression Cleared by psychiatry.  Follow-up outpatient.  Hyperlipidemia Resume home Lipitor.      Discharge Diagnoses:  Principal Problem:   AKI (acute kidney injury) ([place]) Active Problems:   HYPERTENSION, BENIGN ESSENTIAL   Schizophrenia, paranoid type ([place])   Hypokalemia   Dysarthria   Overdose of salicylate   Major depressive disorder, recurrent episode, moderate ([place])      Consultations:  Psychiatry  Subjective: Feels great, denies any suicidal homicidal ideation.  Daughter is present at bedside.  Denies having previous suicide attempts.  Discharge Exam: Vitals:   12/31/20 0759 12/31/20 1141  BP: (!) 102/49 124/76  Pulse: 72 73  Resp: 17 17  Temp: 98.1 F (36.7 C) 98.1 F (36.7 C)  SpO2: 96% 97%   Vitals:   12/30/20 2029 12/31/20 0600 12/31/20 0759 12/31/20 1141  BP: 102/70 (!) 94/49 (!) 102/49 124/76  Pulse: 85  72 73  Resp: _0 Temp: 98.3 F (36.8 C)  98.1 F (36.7 C) 98.1 F (36.7 C)  TempSrc: Oral  Oral Oral  SpO2: 97%  96% 97%  Weight:      Height:  General: Pt is alert, awake, not in acute distress Cardiovascular: RRR, S1/S2 +, no rubs, no gallops Respiratory: CTA bilaterally, no wheezing, no  rhonchi Abdominal: Soft, NT, ND, bowel sounds + Extremities: no edema, no cyanosis  Discharge Instructions   Allergies as of 12/31/2020      Reactions   Clonazepam Swelling   Pt was recently given a triamterene hydrochlorothiazide combination as well as clonazepam and had an allergic reaction to one of them. Caused swelling of face and eyes   Doxycycline Swelling   Swelling of face and eyes   Fosinopril Sodium Swelling    swelling face,lips-angioedema from ACE I   Hydrochlorothiazide W-triamterene Swelling   Pt was recently given a triamterene hydrochlorothiazide combination as well as clonazepam and had an allergic reaction to one of them. Swelling of face and eyes   Penicillins Swelling   Face and eyes Has patient had a PCN reaction causing immediate rash, facial/tongue/throat swelling, SOB or lightheadedness with hypotension: Yes Has patient had a PCN reaction causing severe rash involving mucus membranes or skin necrosis: No Has patient had a PCN reaction that required hospitalization: No Has patient had a PCN reaction occurring within the last 10 years: No If all of the above answers are "NO", then may proceed with Cephalosporin use.   Sulfa Antibiotics Other (See Comments)   Unknown reaction      Medication List    STOP taking these medications   traMADol 50 MG tablet Commonly known as: ULTRAM     TAKE these medications   albuterol (2.5 MG/3ML) 0.083% nebulizer solution Commonly known as: PROVENTIL Take 2.5 mg by nebulization every 6 (six) hours as needed for wheezing or shortness of breath.   albuterol 108 (90 Base) MCG/ACT inhaler Commonly known as: VENTOLIN HFA Inhale 1-2 puffs into the lungs every 6 (six) hours as needed for wheezing or shortness of breath (or cough).   amLODipine 5 MG tablet Commonly known as: NORVASC Take 5 mg by mouth daily.   atorvastatin 40 MG tablet Commonly known as: LIPITOR Take 40 mg by mouth daily.   hydrOXYzine 25 MG  capsule Commonly known as: VISTARIL Take 25 mg by mouth every 8 (eight) hours as needed for anxiety.   Ingrezza 40 MG capsule Generic drug: valbenazine Take 40 mg by mouth daily.   pregabalin 150 MG capsule Commonly known as: LYRICA Take 150 mg by mouth 2 (two) times daily.   QUEtiapine 100 MG tablet Commonly known as: SEROQUEL Take 1 tablet (100 mg total) by mouth daily.   sertraline 100 MG tablet Commonly known as: ZOLOFT Take 100 mg by mouth at bedtime.   tiZANidine 2 MG tablet Commonly known as: ZANAFLEX Take 2 mg by mouth every 8 (eight) hours as needed for muscle spasms.       Allergies  Allergen Reactions  . Clonazepam Swelling    Pt was recently given a triamterene hydrochlorothiazide combination as well as clonazepam and had an allergic reaction to one of them. Caused swelling of face and eyes  . Doxycycline Swelling    Swelling of face and eyes  . Fosinopril Sodium Swelling     swelling face,lips-angioedema from ACE I  . Hydrochlorothiazide W-Triamterene Swelling    Pt was recently given a triamterene hydrochlorothiazide combination as well as clonazepam and had an allergic reaction to one of them. Swelling of face and eyes  . Penicillins Swelling    Face and eyes Has patient had a PCN reaction causing immediate rash, facial/tongue/throat swelling,  SOB or lightheadedness with hypotension: Yes Has patient had a PCN reaction causing severe rash involving mucus membranes or skin necrosis: No Has patient had a PCN reaction that required hospitalization: No Has patient had a PCN reaction occurring within the last 10 years: No If all of the above answers are "NO", then may proceed with Cephalosporin use.   . Sulfa Antibiotics Other (See Comments)    Unknown reaction    You were cared for by a hospitalist during your hospital stay. If you have any questions about your discharge medications or the care you received while you were in the hospital after you are  discharged, you can call the unit and asked to speak with the hospitalist on call if the hospitalist that took care of you is not available. Once you are discharged, your primary care physician will handle any further medical issues. Please note that no refills for any discharge medications will be authorized once you are discharged, as it is imperative that you return to your primary care physician (or establish a relationship with a primary care physician if you do not have one) for your aftercare needs so that they can reassess your need for medications and monitor your lab values.   Procedures/Studies: CT Head Wo Contrast  Result Date: 12/29/2020 CLINICAL DATA:  Delirium.  Fall last night at facility. EXAM: CT HEAD WITHOUT CONTRAST TECHNIQUE: Contiguous axial images were obtained from the base of the skull through the vertex without intravenous contrast. COMPARISON:  Head CT 03/21/2019 FINDINGS: Brain: No intracranial hemorrhage, mass effect, or midline shift. Remote infarct involving the right posterior temporal and occipital lobe. Similar degree of chronic small vessel ischemia from prior. Incidental bilateral basal gangliar mineralization, likely senescent. No hydrocephalus. The basilar cisterns are patent. No evidence of territorial infarct or acute ischemia. No extra-axial or intracranial fluid collection. Vascular: Atherosclerosis of skullbase vasculature without hyperdense vessel or abnormal calcification. Skull: No fracture or focal lesion. Sinuses/Orbits: Paranasal sinuses and mastoid air cells are clear. The visualized orbits are unremarkable. Other: None. IMPRESSION: 1. No acute intracranial abnormality. 2. Remote right posterior temporal and occipital lobe infarct. 3. Unchanged chronic small vessel ischemia. Electronically Signed   By: Keith Rake M.D.   On: 12/29/2020 17:57   MR BRAIN WO CONTRAST  Result Date: 12/30/2020 CLINICAL DATA:  Slurred speech EXAM: MRI HEAD WITHOUT CONTRAST  TECHNIQUE: Multiplanar, multiecho pulse sequences of the brain and surrounding structures were obtained without intravenous contrast. COMPARISON:  Head CT from yesterday FINDINGS: Brain: No acute infarction, hemorrhage, hydrocephalus, extra-axial collection or mass lesion. Remote right PCA distribution infarct affecting the posterior temporal and occipital lobes inferiorly. Chronic small vessel ischemic type FLAIR hyperintensity in the deep cerebral white matter. Vascular: Normal flow voids. Skull and upper cervical spine: Prominent degenerative facet spurring on the right. Sinuses/Orbits: Negative Other: Motion degraded to the degree that small or subtle findings could be obscured. IMPRESSION: 1. No acute or reversible finding. 2. Chronic small vessel disease and remote right PCA distribution infarct. Electronically Signed   By: Monte Fantasia M.D.   On: 12/30/2020 04:26   DG Chest Portable 1 View  Result Date: 12/29/2020 CLINICAL DATA:  Altered mental status. EXAM: PORTABLE CHEST 1 VIEW COMPARISON:  April 30, 2020 FINDINGS: Mild, chronic appearing increased interstitial lung markings are seen. A stable ovoid opacity is seen overlying the infrahilar region on the right. There is no evidence of a pleural effusion or pneumothorax. The cardiac silhouette is mildly enlarged. Degenerative changes seen throughout  the thoracic spine. IMPRESSION: Stable exam without evidence of acute or active cardiopulmonary disease. Electronically Signed   By: Virgina Norfolk M.D.   On: 12/29/2020 17:51   DG Abd Portable 1 View  Result Date: 12/30/2020 CLINICAL DATA:  Evaluation prior to MRI, assess for metallic foreign bodies EXAM: PORTABLE ABDOMEN - 1 VIEW COMPARISON:  11/16/2007 FINDINGS: Supine frontal view of the abdomen and pelvis was obtained, excluding the left flank and superficial soft tissues of the right lateral abdominal wall by collimation. Bowel gas pattern is unremarkable. No masses or abnormal calcifications.  No metallic foreign bodies identified within the visualized structures. No acute bony abnormalities. IMPRESSION: 1. No evidence of metallic foreign body within the visualized portions of the abdomen or pelvis. 2. Unremarkable bowel gas pattern. Electronically Signed   By: Randa Ngo M.D.   On: 12/30/2020 03:45   ECHOCARDIOGRAM COMPLETE  Result Date: 12/30/2020    ECHOCARDIOGRAM REPORT   Patient Name:   Danielle [name] Date of Exam: 12/30/2020 Medical Rec #:  [id number]         Height:       61.0 in Accession #:    [id number]        Weight:       135.0 lb Date of Birth:  July 08, 1951         BSA:          1.598 m Patient Age:    38 years          BP:           104/60 mmHg Patient Gender: F                 HR:           97 bpm. Exam Location:  Inpatient Procedure: 2D Echo Indications:    TIA G45.9  History:        Patient has prior history of Echocardiogram examinations, most                 recent 03/22/2019. CAD; Risk Factors:Hypertension and Current                 Smoker.  Sonographer:    Mikki Santee RDCS (AE) Referring Phys: [id number] [place]  1. Left ventricular ejection fraction, by estimation, is 60 to 65%. The left ventricle has normal function. The left ventricle has no regional wall motion abnormalities. Left ventricular diastolic parameters are consistent with Grade I diastolic dysfunction (impaired relaxation).  2. Right ventricular systolic function is normal. The right ventricular size is normal.  3. The mitral valve is degenerative. Mild to moderate mitral valve regurgitation. No evidence of mitral stenosis.  4. The aortic valve is normal in structure. Aortic valve regurgitation is trivial. No aortic stenosis is present.  5. The inferior vena cava is normal in size with greater than 50% respiratory variability, suggesting right atrial pressure of 3 mmHg. FINDINGS  Left Ventricle: Left ventricular ejection fraction, by estimation, is 60 to 65%. The left ventricle has  normal function. The left ventricle has no regional wall motion abnormalities. The left ventricular internal cavity size was normal in size. There is  no left ventricular hypertrophy. Left ventricular diastolic parameters are consistent with Grade I diastolic dysfunction (impaired relaxation). Indeterminate filling pressures. Right Ventricle: The right ventricular size is normal. No increase in right ventricular wall thickness. Right ventricular systolic function is normal. Left Atrium: Left atrial size was normal in size. Right Atrium: Right atrial size  was normal in size. Pericardium: There is no evidence of pericardial effusion. Mitral Valve: The mitral valve is degenerative in appearance. There is mild thickening of the mitral valve leaflet(s). Mild to moderate mitral valve regurgitation, with eccentric laterally directed jet. No evidence of mitral valve stenosis. Tricuspid Valve: The tricuspid valve is normal in structure. Tricuspid valve regurgitation is not demonstrated. No evidence of tricuspid stenosis. Aortic Valve: The aortic valve is normal in structure. Aortic valve regurgitation is trivial. No aortic stenosis is present. Pulmonic Valve: The pulmonic valve was normal in structure. Pulmonic valve regurgitation is not visualized. No evidence of pulmonic stenosis. Aorta: The aortic root is normal in size and structure. Venous: The inferior vena cava is normal in size with greater than 50% respiratory variability, suggesting right atrial pressure of 3 mmHg. IAS/Shunts: No atrial level shunt detected by color flow Doppler.  LEFT VENTRICLE PLAX 2D LVIDd:         5.10 cm  Diastology LVIDs:         3.80 cm  LV e' medial:    5.87 cm/s LV PW:         0.70 cm  LV E/e' medial:  11.2 LV IVS:        1.10 cm  LV e' lateral:   7.72 cm/s LVOT diam:     1.80 cm  LV E/e' lateral: 8.5 LV SV:         35 LV SV Index:   22 LVOT Area:     2.54 cm  LEFT ATRIUM             Index       RIGHT ATRIUM           Index LA diam:         3.10 cm 1.94 cm/m  RA Area:     13.40 cm LA Vol (A2C):   28.1 ml 17.58 ml/m RA Volume:   32.40 ml  20.27 ml/m LA Vol (A4C):   50.4 ml 31.54 ml/m LA Biplane Vol: 40.3 ml 25.22 ml/m  AORTIC VALVE LVOT Vmax:   84.80 cm/s LVOT Vmean:  54.800 cm/s LVOT VTI:    0.139 m  AORTA Ao Root diam: 2.80 cm MITRAL VALVE MV Area (PHT): 5.13 cm    SHUNTS MV Decel Time: 148 msec    Systemic VTI:  0.14 m MR Peak grad: 70.2 mmHg    Systemic Diam: 1.80 cm MR Vmax:      419.00 cm/s MV E velocity: 65.50 cm/s MV A velocity: 81.40 cm/s MV E/A ratio:  0.80 Mihai Croitoru MD Electronically signed by Sanda Klein MD Signature Date/Time: 12/30/2020/3:53:52 PM    Final    VAS US CAROTID (at [facility] and [facility] only)  Result Date: 12/30/2020 Carotid Arterial Duplex Study Indications:  TIA. Risk Factors: Hypertension, hyperlipidemia, past history of smoking, coronary               artery disease. Performing Technologist: Darlin Coco RDMS,RVT  Examination Guidelines: A complete evaluation includes B-mode imaging, spectral Doppler, color Doppler, and power Doppler as needed of all accessible portions of each vessel. Bilateral testing is considered an integral part of a complete examination. Limited examinations for reoccurring indications may be performed as noted.  Right Carotid Findings: +----------+--------+--------+--------+---------------------+--------+           PSV cm/sEDV cm/sStenosisPlaque Description   Comments +----------+--------+--------+--------+---------------------+--------+ CCA Prox  114     35  focal and hyperechoic         +----------+--------+--------+--------+---------------------+--------+ CCA Distal94      29                                            +----------+--------+--------+--------+---------------------+--------+ ICA Prox  71      27      1-39%   heterogenous                  +----------+--------+--------+--------+---------------------+--------+ ICA Distal81      39                                             +----------+--------+--------+--------+---------------------+--------+ ECA       96                                                    +----------+--------+--------+--------+---------------------+--------+ +----------+--------+-------+----------------+-------------------+           PSV cm/sEDV cmsDescribe        Arm Pressure (mmHG) +----------+--------+-------+----------------+-------------------+ [id number]            Multiphasic, WNL                    +----------+--------+-------+----------------+-------------------+ +---------+--------+--+--------+--+---------+ VertebralPSV cm/s44EDV cm/s19Antegrade +---------+--------+--+--------+--+---------+  Left Carotid Findings: +----------+--------+--------+--------+------------------+--------+           PSV cm/sEDV cm/sStenosisPlaque DescriptionComments +----------+--------+--------+--------+------------------+--------+ CCA Prox  105     24                                         +----------+--------+--------+--------+------------------+--------+ CCA Distal87      32                                         +----------+--------+--------+--------+------------------+--------+ ICA Prox  76      34      1-39%   heterogenous               +----------+--------+--------+--------+------------------+--------+ ICA Distal106     42                                         +----------+--------+--------+--------+------------------+--------+ ECA       143     22              heterogenous               +----------+--------+--------+--------+------------------+--------+ +----------+--------+--------+----------------+-------------------+           PSV cm/sEDV cm/sDescribe        Arm Pressure (mmHG) +----------+--------+--------+----------------+-------------------+ [id number]             Multiphasic, WNL                     +----------+--------+--------+----------------+-------------------+ +---------+--------+--+--------+--+---------+ VertebralPSV cm/s73EDV cm/s27Antegrade +---------+--------+--+--------+--+---------+   Summary: Right Carotid: Velocities in the right ICA are consistent with  a 1-39% stenosis. Left Carotid: Velocities in the left ICA are consistent with a 1-39% stenosis. Vertebrals:  Bilateral vertebral arteries demonstrate antegrade flow. Subclavians: Normal flow hemodynamics were seen in bilateral subclavian              arteries. *See table(s) above for measurements and observations.  Electronically signed by Deitra Mayo MD on 12/30/2020 at 4:47:27 PM.    Final       The results of significant diagnostics from this hospitalization (including imaging, microbiology, ancillary and laboratory) are listed below for reference.     Microbiology: Recent Results (from the past 240 hour(s))  Resp Panel by RT-PCR (Flu A&B, Covid) Nasopharyngeal Swab     Status: None   Collection Time: 12/29/20  7:43 PM   Specimen: Nasopharyngeal Swab; Nasopharyngeal(NP) swabs in vial transport medium  Result Value Ref Range Status   SARS Coronavirus 2 by RT PCR NEGATIVE NEGATIVE Final    Comment: (NOTE) SARS-CoV-2 target nucleic acids are NOT DETECTED.  The SARS-CoV-2 RNA is generally detectable in upper respiratory specimens during the acute phase of infection. The lowest concentration of SARS-CoV-2 viral copies this assay can detect is 138 copies/mL. A negative result does not preclude SARS-Cov-2 infection and should not be used as the sole basis for treatment or other patient management decisions. A negative result may occur with  improper specimen collection/handling, submission of specimen other than nasopharyngeal swab, presence of viral mutation(s) within the areas targeted by this assay, and inadequate number of viral copies(<138 copies/mL). A negative result must be combined with clinical  observations, patient history, and epidemiological information. The expected result is Negative.  Fact Sheet for Patients:  EntrepreneurPulse.com.au  Fact Sheet for Healthcare Providers:  IncredibleEmployment.be  This test is no t yet approved or cleared by the [place] FDA and  has been authorized for detection and/or diagnosis of SARS-CoV-2 by FDA under an Emergency Use Authorization (EUA). This EUA will remain  in effect (meaning this test can be used) for the duration of the COVID-19 declaration under Section 564(b)(1) of the Act, 21 U.S.C.section 360bbb-3(b)(1), unless the authorization is terminated  or revoked sooner.       Influenza A by PCR NEGATIVE NEGATIVE Final   Influenza B by PCR NEGATIVE NEGATIVE Final    Comment: (NOTE) The Xpert Xpress SARS-CoV-2/FLU/RSV plus assay is intended as an aid in the diagnosis of influenza from Nasopharyngeal swab specimens and should not be used as a sole basis for treatment. Nasal washings and aspirates are unacceptable for Xpert Xpress SARS-CoV-2/FLU/RSV testing.  Fact Sheet for Patients: EntrepreneurPulse.com.au  Fact Sheet for Healthcare Providers: IncredibleEmployment.be  This test is not yet approved or cleared by the [place] FDA and has been authorized for detection and/or diagnosis of SARS-CoV-2 by FDA under an Emergency Use Authorization (EUA). This EUA will remain in effect (meaning this test can be used) for the duration of the COVID-19 declaration under Section 564(b)(1) of the Act, 21 U.S.C. section 360bbb-3(b)(1), unless the authorization is terminated or revoked.  Performed at [facility], [place] [address], [place], [place] [address]      Labs: BNP (last 3 results) No results for input(s): BNP in the last 8760 hours. Basic Metabolic Panel: Recent Labs  Lab 12/29/20 1925 12/29/20 2006 12/30/20 0919 12/30/20 1225  12/30/20 1600 12/30/20 2359 12/31/20 0829  NA  --    < > 136 138 140 141 142  K  --    < > 3.1* 2.9* 3.7  3.5 3.6  CL  --    < > 104 103 104 104 107  CO2  --    < > _0 GLUCOSE  --    < > 135* 157* 97 101* 85  BUN  --    < > 24* _1 CREATININE  --    < > 1.10* 1.03* 0.97 0.84 0.75  CALCIUM  --    < > 8.3* 8.0* 9.0 8.5* 8.7*  MG 2.0  --   --   --   --   --   --    < > = values in this interval not displayed.   Liver Function Tests: Recent Labs  Lab 12/29/20 1800  AST 41  ALT 18  ALKPHOS 61  BILITOT 0.8  PROT 7.0  ALBUMIN 3.7   No results for input(s): LIPASE, AMYLASE in the last 168 hours. No results for input(s): AMMONIA in the last 168 hours. CBC: Recent Labs  Lab 12/29/20 1800 12/29/20 2006 12/31/20 0829  WBC 7.7  --  5.3  NEUTROABS 3.8  --   --   HGB 13.7 14.3 12.2  HCT 40.8 42.0 34.8*  MCV 89.1  --  88.8  PLT 317  --  260   Cardiac Enzymes: No results for input(s): CKTOTAL, CKMB, CKMBINDEX, TROPONINI in the last 168 hours. BNP: Invalid input(s): POCBNP CBG: No results for input(s): GLUCAP in the last 168 hours. D-Dimer No results for input(s): DDIMER in the last 72 hours. Hgb A1c No results for input(s): HGBA1C in the last 72 hours. Lipid Profile Recent Labs    12/30/20 2359  CHOL 152  HDL 53  LDLCALC 83  TRIG 82  CHOLHDL 2.9   Thyroid function studies No results for input(s): TSH, T4TOTAL, T3FREE, THYROIDAB in the last 72 hours.  Invalid input(s): FREET3 Anemia work up No results for input(s): VITAMINB12, FOLATE, FERRITIN, TIBC, IRON, RETICCTPCT in the last 72 hours. Urinalysis    Component Value Date/Time   COLORURINE YELLOW 12/29/2020 2228   APPEARANCEUR HAZY (A) 12/29/2020 2228   LABSPEC 1.027 12/29/2020 2228   PHURINE 5.0 12/29/2020 2228   GLUCOSEU NEGATIVE 12/29/2020 2228   HGBUR NEGATIVE 12/29/2020 2228   HGBUR negative 10/25/2008 0936   BILIRUBINUR NEGATIVE 12/29/2020 2228   KETONESUR NEGATIVE 12/29/2020  2228   PROTEINUR 30 (A) 12/29/2020 2228   UROBILINOGEN 0.2 03/23/2018 1531   NITRITE NEGATIVE 12/29/2020 2228   LEUKOCYTESUR LARGE (A) 12/29/2020 2228   Sepsis Labs Invalid input(s): PROCALCITONIN,  WBC,  LACTICIDVEN Microbiology Recent Results (from the past 240 hour(s))  Resp Panel by RT-PCR (Flu A&B, Covid) Nasopharyngeal Swab     Status: None   Collection Time: 12/29/20  7:43 PM   Specimen: Nasopharyngeal Swab; Nasopharyngeal(NP) swabs in vial transport medium  Result Value Ref Range Status   SARS Coronavirus 2 by RT PCR NEGATIVE NEGATIVE Final    Comment: (NOTE) SARS-CoV-2 target nucleic acids are NOT DETECTED.  The SARS-CoV-2 RNA is generally detectable in upper respiratory specimens during the acute phase of infection. The lowest concentration of SARS-CoV-2 viral copies this assay can detect is 138 copies/mL. A negative result does not preclude SARS-Cov-2 infection and should not be used as the sole basis for treatment or other patient management decisions. A negative result may occur with  improper specimen collection/handling, submission of specimen other than nasopharyngeal swab, presence of viral mutation(s) within the areas targeted by this assay, and inadequate number of viral  copies(<138 copies/mL). A negative result must be combined with clinical observations, patient history, and epidemiological information. The expected result is Negative.  Fact Sheet for Patients:  EntrepreneurPulse.com.au  Fact Sheet for Healthcare Providers:  IncredibleEmployment.be  This test is no t yet approved or cleared by the [place] FDA and  has been authorized for detection and/or diagnosis of SARS-CoV-2 by FDA under an Emergency Use Authorization (EUA). This EUA will remain  in effect (meaning this test can be used) for the duration of the COVID-19 declaration under Section 564(b)(1) of the Act, 21 U.S.C.section 360bbb-3(b)(1), unless the  authorization is terminated  or revoked sooner.       Influenza A by PCR NEGATIVE NEGATIVE Final   Influenza B by PCR NEGATIVE NEGATIVE Final    Comment: (NOTE) The Xpert Xpress SARS-CoV-2/FLU/RSV plus assay is intended as an aid in the diagnosis of influenza from Nasopharyngeal swab specimens and should not be used as a sole basis for treatment. Nasal washings and aspirates are unacceptable for Xpert Xpress SARS-CoV-2/FLU/RSV testing.  Fact Sheet for Patients: EntrepreneurPulse.com.au  Fact Sheet for Healthcare Providers: IncredibleEmployment.be  This test is not yet approved or cleared by the [place] FDA and has been authorized for detection and/or diagnosis of SARS-CoV-2 by FDA under an Emergency Use Authorization (EUA). This EUA will remain in effect (meaning this test can be used) for the duration of the COVID-19 declaration under Section 564(b)(1) of the Act, 21 U.S.C. section 360bbb-3(b)(1), unless the authorization is terminated or revoked.  Performed at [facility], [place] [address] [place], [place] [address]      Time coordinating discharge:  I have spent 35 minutes face to face with the patient and on the ward discussing the patients care, assessment, plan and disposition with other care givers. >50% of the time was devoted counseling the patient about the risks and benefits of treatment/Discharge disposition and coordinating care.   SIGNED:   Damita Lack, MD  [facility] 12/31/2020, 1:53 PM   If 7PM-7AM, please contact night-coverage

## 2020-12-31 NOTE — Progress Notes (Signed)
HOSPITAL MEDICINE OVERNIGHT EVENT NOTE    Notified by nursing that poison control has called and stated that based on labs bicarbonate infusion can be discontinued.  Order has been placed.  Marinda Elk  MD [facility]
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
## 2020-12-31 NOTE — Progress Notes (Signed)
Medically cleared to transfer to inpatient Psych. Full Note to follow.

## 2020-12-31 NOTE — NC FL2 (Signed)
[place] MEDICAID FL2 LEVEL OF CARE SCREENING TOOL     IDENTIFICATION  Patient Name: Danielle [name] Birthdate: October 18, 1951 Sex: female Admission Date (Current Location): 12/29/2020  [place] and [place] Number:  Haynes Bast [id number] K Facility and Address:  [facility], [address], [place], [place] [address]      Provider Number: [id number]  Attending Physician Name and Address:  Dimple Nanas, MD  Relative Name and Phone Number:  Theodoro Doing Daughter   [phone]    Current Level of Care: Hospital Recommended Level of Care: Assisted Living Facility Prior Approval Number:    Date Approved/Denied:   PASRR Number:    Discharge Plan: Other (Comment) (ALF)    Current Diagnoses: Patient Active Problem List   Diagnosis Date Noted  . Major depressive disorder, recurrent episode, moderate (HCC)   . Dysarthria 12/29/2020  . AKI (acute kidney injury) (HCC) 12/29/2020  . Overdose of salicylate 12/29/2020  . CAD (coronary artery disease) 03/22/2019  . Syncope 03/21/2019  . Unstable angina (HCC)   . Abnormal nuclear stress test   . Chest pain 08/13/2018  . Hypokalemia 08/11/2018  . Tobacco abuse 08/11/2018  . Chest pain with high risk for cardiac etiology 08/10/2018  . Memory disturbance 01/30/2016  . Acute encephalopathy 05/23/2014  . Altered mental status 12/08/2013  . Schizophrenia, paranoid type (HCC) 04/28/2012  . Cocaine abuse (HCC) 04/28/2012  . Cannabis abuse 04/28/2012  . Substance induced mood disorder (HCC) 04/28/2012  . Noncompliance 09/16/2011  . Anxiety 09/16/2011  . Constipation 09/16/2011  . Chest pain 09/14/2011  . SUBSTANCE ABUSE, MULTIPLE 12/03/2008  . HYPERLIPIDEMIA 08/01/2008  . MIGRAINE HEADACHE 08/01/2008  . ALLERGIC RHINITIS WITH CONJUNCTIVITIS 08/01/2008  . ASTHMA 08/01/2008  . DEPRESSION 06/20/2008  . URI 06/20/2008  . ANXIETY DISORDER, GENERALIZED 05/28/2008  . HYPERTENSION, BENIGN ESSENTIAL  05/28/2008  . GERD 05/28/2008  . BACTERIAL VAGINITIS 05/24/2008  . DEEP VENOUS THROMBOPHLEBITIS, LEFT, LEG, HX OF 10/18/1988    Orientation RESPIRATION BLADDER Height & Weight     Self,Time,Situation,Place  Normal Continent Weight: 134 lb 14.7 oz (61.2 kg) Height:  5\' 4"  (162.6 cm)  BEHAVIORAL SYMPTOMS/MOOD NEUROLOGICAL BOWEL NUTRITION STATUS      Continent Diet (Heart diet.  See discharge summary.)  AMBULATORY STATUS COMMUNICATION OF NEEDS Skin   Independent Verbally Normal                       Personal Care Assistance Level of Assistance  Bathing,Feeding,Dressing Bathing Assistance: Independent Feeding assistance: Independent Dressing Assistance: Independent     Functional Limitations Info  Sight,Hearing,Speech Sight Info: Adequate Hearing Info: Adequate Speech Info: Adequate    SPECIAL CARE FACTORS FREQUENCY                       Contractures Contractures Info: Not present    Additional Factors Info  Allergies,Code Status,Psychotropic Code Status Info: full Allergies Info: Clonazepam, Doxycycline, Fosinopril Sodium, Hydrochlorothiazide W-triamterene, Penicillins, Sulfa Antibiotics Psychotropic Info: seroquel         Current Medications (12/31/2020):  This is the current hospital active medication list Current Facility-Administered Medications  Medication Dose Route Frequency Provider Last Rate Last Admin  . acetaminophen (TYLENOL) tablet 650 mg  650 mg Oral Q4H PRN Chotiner, 01/02/2021, MD   650 mg at 12/31/20 1005   Or  . acetaminophen (TYLENOL) 160 MG/5ML solution 650 mg  650 mg Per Tube Q4H PRN Chotiner, 01/02/21, MD  Or  . acetaminophen (TYLENOL) suppository 650 mg  650 mg Rectal Q4H PRN Chotiner, Claudean Severance, MD      . albuterol (PROVENTIL) (2.5 MG/3ML) 0.083% nebulizer solution 2.5 mg  2.5 mg Inhalation Q6H PRN Chotiner, Claudean Severance, MD      . amLODipine (NORVASC) tablet 2.5 mg  2.5 mg Oral Daily Chotiner, Claudean Severance, MD   2.5 mg at 12/30/20  1344  . aspirin chewable tablet 81 mg  81 mg Oral Daily Dow Adolph N, DO   81 mg at 12/31/20 1008  . atorvastatin (LIPITOR) tablet 20 mg  20 mg Oral q1800 Chotiner, Claudean Severance, MD   20 mg at 12/30/20 1746  . carvedilol (COREG) tablet 6.25 mg  6.25 mg Oral BID WC Chotiner, Claudean Severance, MD   6.25 mg at 12/31/20 1006  . nicotine (NICODERM CQ - dosed in mg/24 hours) patch 21 mg  21 mg Transdermal Daily Dagar, Geralynn Rile, MD      . QUEtiapine (SEROQUEL) tablet 100 mg  100 mg Oral QHS Dagar, Geralynn Rile, MD   100 mg at 12/30/20 2037  . senna-docusate (Senokot-S) tablet 1 tablet  1 tablet Oral QHS PRN Chotiner, Claudean Severance, MD      . sertraline (ZOLOFT) tablet 50 mg  50 mg Oral Daily Chotiner, Claudean Severance, MD   50 mg at 12/31/20 1007     Discharge Medications: Please see discharge summary for a list of discharge medications.  Relevant Imaging Results:  Relevant Lab Results:   Additional Information SSN: [id number]  Lorri Frederick, LCSW

## 2022-05-03 ENCOUNTER — Encounter (HOSPITAL_COMMUNITY): Payer: Self-pay

## 2022-05-03 ENCOUNTER — Emergency Department (HOSPITAL_COMMUNITY): Payer: Medicare Other

## 2022-05-03 ENCOUNTER — Emergency Department (HOSPITAL_COMMUNITY)
Admission: EM | Admit: 2022-05-03 | Discharge: 2022-05-03 | Disposition: A | Discharge status: Home / Self Care | Payer: Medicare Other | Attending: Emergency Medicine | Admitting: Emergency Medicine

## 2022-05-03 DIAGNOSIS — I2510 Atherosclerotic heart disease of native coronary artery without angina pectoris: Secondary | ICD-10-CM | POA: Diagnosis not present

## 2022-05-03 DIAGNOSIS — N390 Urinary tract infection, site not specified: Secondary | ICD-10-CM | POA: Diagnosis not present

## 2022-05-03 DIAGNOSIS — K5900 Constipation, unspecified: Secondary | ICD-10-CM | POA: Diagnosis not present

## 2022-05-03 DIAGNOSIS — I10 Essential (primary) hypertension: Secondary | ICD-10-CM | POA: Insufficient documentation

## 2022-05-03 DIAGNOSIS — R109 Unspecified abdominal pain: Secondary | ICD-10-CM | POA: Diagnosis present

## 2022-05-03 DIAGNOSIS — Z79899 Other long term (current) drug therapy: Secondary | ICD-10-CM | POA: Insufficient documentation

## 2022-05-03 DIAGNOSIS — D72829 Elevated white blood cell count, unspecified: Secondary | ICD-10-CM | POA: Insufficient documentation

## 2022-05-03 LAB — COMPREHENSIVE METABOLIC PANEL
ALT: 13 U/L (ref 0–44)
AST: 16 U/L (ref 15–41)
Albumin: 3.7 g/dL (ref 3.5–5.0)
Alkaline Phosphatase: 66 U/L (ref 38–126)
Anion gap: 8 (ref 5–15)
BUN: 14 mg/dL (ref 8–23)
CO2: 23 mmol/L (ref 22–32)
Calcium: 9.8 mg/dL (ref 8.9–10.3)
Chloride: 106 mmol/L (ref 98–111)
Creatinine, Ser: 0.73 mg/dL (ref 0.44–1.00)
GFR, Estimated: >60 mL/min (ref >60)
Glucose, Bld: 95 mg/dL (ref 70–99)
Potassium: 3.8 mmol/L (ref 3.5–5.1)
Sodium: 137 mmol/L (ref 135–145)
Total Bilirubin: 0.5 mg/dL (ref 0.3–1.2)
Total Protein: 7.7 g/dL (ref 6.5–8.1)

## 2022-05-03 LAB — CBC WITH DIFFERENTIAL/PLATELET
Abs Immature Granulocytes: 0.05 10*3/uL (ref 0.00–0.07)
Basophils Absolute: 0.1 10*3/uL (ref 0.0–0.1)
Basophils Relative: 0 %
Eosinophils Absolute: 0.3 10*3/uL (ref 0.0–0.5)
Eosinophils Relative: 3 %
HCT: 36 % (ref 36.0–46.0)
Hemoglobin: 11.9 g/dL — ABNORMAL LOW (ref 12.0–15.0)
Immature Granulocytes: 0 %
Lymphocytes Relative: 19 %
Lymphs Abs: 2.1 10*3/uL (ref 0.7–4.0)
MCH: 29.5 pg (ref 26.0–34.0)
MCHC: 33.1 g/dL (ref 30.0–36.0)
MCV: 89.1 fL (ref 80.0–100.0)
Monocytes Absolute: 0.8 10*3/uL (ref 0.1–1.0)
Monocytes Relative: 7 %
Neutro Abs: 7.8 10*3/uL — ABNORMAL HIGH (ref 1.7–7.7)
Neutrophils Relative %: 71 %
Platelets: 308 10*3/uL (ref 150–400)
RBC: 4.04 MIL/uL (ref 3.87–5.11)
RDW: 13.3 % (ref 11.5–15.5)
WBC: 11.1 10*3/uL — ABNORMAL HIGH (ref 4.0–10.5)
nRBC: 0 % (ref 0.0–0.2)

## 2022-05-03 LAB — URINALYSIS, ROUTINE W REFLEX MICROSCOPIC
Bilirubin Urine: NEGATIVE
Glucose, UA: NEGATIVE mg/dL
Hgb urine dipstick: NEGATIVE
Ketones, ur: NEGATIVE mg/dL
Nitrite: NEGATIVE
Protein, ur: NEGATIVE mg/dL
Specific Gravity, Urine: 1.03 (ref 1.005–1.030)
pH: 5 (ref 5.0–8.0)

## 2022-05-03 LAB — LIPASE, BLOOD: Lipase: 33 U/L (ref 11–51)

## 2022-05-03 MED ORDER — HYDROCODONE-ACETAMINOPHEN 5-325 MG PO TABS
1.0000 | ORAL_TABLET | Freq: Once | ORAL | Status: AC
  Administered 2022-05-03: 1 via ORAL
  Filled 2022-05-03: qty 1

## 2022-05-03 MED ORDER — IOHEXOL 300 MG/ML  SOLN
100.0000 mL | Freq: Once | INTRAMUSCULAR | Status: AC | PRN
  Administered 2022-05-03: 100 mL via INTRAVENOUS

## 2022-05-03 MED ORDER — ACETAMINOPHEN 500 MG PO TABS: 1000.0000 mg | ORAL_TABLET | Freq: Once | ORAL | Status: DC

## 2022-05-03 MED ORDER — CEFDINIR 300 MG PO CAPS: 300.0000 mg | ORAL_CAPSULE | Freq: Two times a day (BID) | ORAL | 0 refills | Status: AC

## 2022-05-03 MED ORDER — HYDROCODONE-ACETAMINOPHEN 5-325 MG PO TABS
2.0000 | ORAL_TABLET | Freq: Once | ORAL | Status: AC
  Administered 2022-05-03: 2 via ORAL
  Filled 2022-05-03: qty 2

## 2022-05-03 NOTE — ED Notes (Signed)
Pt refused Tylenol and requesting to return home.   MD made aware.

## 2022-05-03 NOTE — ED Notes (Signed)
[facility] has been dispatched.   ETA- 1.5 hours

## 2022-05-03 NOTE — ED Provider Triage Note (Signed)
Emergency Medicine Provider Triage Evaluation Note  Danielle [name] , a 71 y.o. female  was evaluated in triage.  Pt complains of back and abdominal pain  Review of Systems  Positive: Abdominal pain Negative: fever  Physical Exam  BP 138/84 (BP Location: Right Arm)   Pulse 89   Temp 98.6 F (37 C) (Oral)   Resp 18   SpO2 98%  Gen:   Awake, no distress   Resp:  Normal effort  MSK:   Moves extremities without difficulty  Other:  Abdomen diffusely tender  Medical Decision Making  Medically screening exam initiated at 1:15 PM.  Appropriate orders placed.  [name] was informed that the remainder of the evaluation will be completed by another provider, this initial triage assessment does not replace that evaluation, and the importance of remaining in the ED until their evaluation is complete.     [name], [place] 05/03/22 1316

## 2022-05-03 NOTE — ED Notes (Addendum)
Called [facility] to provide report of ED care. SNF RN had no further questions or concerns and ready to accept pt.

## 2022-05-03 NOTE — ED Provider Notes (Signed)
[facility]-EMERGENCY DEPT Provider Note   CSN: [id number] Arrival date & time: 05/03/22  1246     History  Chief Complaint  Patient presents with   Abdominal Pain    Danielle [name] is a 71 y.o. female with history of bipolar disorder, coronary artery disease, hypertension presenting to the emergency department with abdominal pain.  Patient reports 2 days of left lower quadrant abdominal pain.  Reports nausea today.  Reports constipation for past 2 days.  No bowel movements.  Reports chronic mild constipation.  Denies fevers or chills.  Denies dysuria.  Symptoms are constant.  She also reports mild left-sided low back pain.  Denies midline pain, numbness, tingling.  Symptoms constant.   Abdominal Pain Associated symptoms: constipation and nausea   Associated symptoms: no chest pain, no chills, no cough, no dysuria, no fever, no hematuria, no shortness of breath, no sore throat and no vomiting        Home Medications Prior to Admission medications   Medication Sig Start Date End Date Taking? Authorizing Provider  cefdinir (OMNICEF) 300 MG capsule Take 1 capsule (300 mg total) by mouth 2 (two) times daily for 7 days. 05/03/22 05/10/22 Yes Lonell Grandchild, MD  albuterol (PROVENTIL HFA;VENTOLIN HFA) 108 (90 Base) MCG/ACT inhaler Inhale 1-2 puffs into the lungs every 6 (six) hours as needed for wheezing or shortness of breath (or cough). 12/31/18   Ronnie Doss A, PA-C  albuterol (PROVENTIL) (2.5 MG/3ML) 0.083% nebulizer solution Take 2.5 mg by nebulization every 6 (six) hours as needed for wheezing or shortness of breath.    [provider]  amLODipine (NORVASC) 5 MG tablet Take 5 mg by mouth daily.    [provider]  atorvastatin (LIPITOR) 40 MG tablet Take 40 mg by mouth daily.    [provider]  hydrOXYzine (VISTARIL) 25 MG capsule Take 25 mg by mouth every 8 (eight) hours as needed for anxiety.    [provider]   pregabalin (LYRICA) 150 MG capsule Take 150 mg by mouth 2 (two) times daily.    [provider]  QUEtiapine (SEROQUEL) 100 MG tablet Take 1 tablet (100 mg total) by mouth daily. 03/23/19   Theotis Barrio, MD  sertraline (ZOLOFT) 100 MG tablet Take 100 mg by mouth at bedtime.    [provider]  tiZANidine (ZANAFLEX) 2 MG tablet Take 2 mg by mouth every 8 (eight) hours as needed for muscle spasms.    [provider]  valbenazine (INGREZZA) 40 MG capsule Take 40 mg by mouth daily.    [provider]      Allergies    Clonazepam, Doxycycline, Fosinopril sodium, Hydrochlorothiazide w-triamterene, Penicillins, and Sulfa antibiotics    Review of Systems   Review of Systems  Constitutional:  Negative for chills and fever.  HENT:  Negative for ear pain and sore throat.   Eyes:  Negative for pain and visual disturbance.  Respiratory:  Negative for cough and shortness of breath.   Cardiovascular:  Negative for chest pain and palpitations.  Gastrointestinal:  Positive for abdominal pain, constipation and nausea. Negative for vomiting.  Genitourinary:  Negative for dysuria and hematuria.  Musculoskeletal:  Negative for arthralgias and back pain.  Skin:  Negative for color change and rash.  Neurological:  Negative for seizures and syncope.  All other systems reviewed and are negative.   Physical Exam Updated Vital Signs BP 138/84 (BP Location: Right Arm)   Pulse 89   Temp 98.6  F (37 C) (Oral)   Resp 18   SpO2 98%  Physical Exam Vitals and nursing note reviewed.  Constitutional:      General: She is not in acute distress.    Appearance: She is well-developed.  HENT:     Head: Normocephalic and atraumatic.  Eyes:     Conjunctiva/sclera: Conjunctivae normal.  Cardiovascular:     Rate and Rhythm: Normal rate and regular rhythm.     Heart sounds: No murmur heard. Pulmonary:     Effort: Pulmonary effort is normal. No respiratory distress.     Breath  sounds: Normal breath sounds.  Abdominal:     Palpations: Abdomen is soft. There is no mass.     Tenderness: There is abdominal tenderness in the suprapubic area and left lower quadrant.  Musculoskeletal:        General: No swelling.     Cervical back: Neck supple.  Skin:    General: Skin is warm and dry.     Capillary Refill: Capillary refill takes less than 2 seconds.  Neurological:     Mental Status: She is alert.  Psychiatric:        Mood and Affect: Mood normal.     ED Results / Procedures / Treatments   Labs (all labs ordered are listed, but only abnormal results are displayed) Labs Reviewed  CBC WITH DIFFERENTIAL/PLATELET - Abnormal; Notable for the following components:      Result Value   WBC 11.1 (*)    Hemoglobin 11.9 (*)    Neutro Abs 7.8 (*)    All other components within normal limits  URINALYSIS, ROUTINE W REFLEX MICROSCOPIC - Abnormal; Notable for the following components:   APPearance HAZY (*)    Leukocytes,Ua LARGE (*)    Bacteria, UA MANY (*)    All other components within normal limits  URINE CULTURE  COMPREHENSIVE METABOLIC PANEL  LIPASE, BLOOD    EKG None  Radiology CT Abdomen Pelvis W Contrast  Result Date: 05/03/2022 CLINICAL DATA:  Left lower quadrant abdominal pain, constipation, rigid left lower quadrant EXAM: CT ABDOMEN AND PELVIS WITH CONTRAST TECHNIQUE: Multidetector CT imaging of the abdomen and pelvis was performed using the standard protocol following bolus administration of intravenous contrast. RADIATION DOSE REDUCTION: This exam was performed according to the departmental dose-optimization program which includes automated exposure control, adjustment of the mA and/or kV according to patient size and/or use of iterative reconstruction technique. CONTRAST:  OMNIPAQUE IOHEXOL 300 MG/ML  SOLN COMPARISON:  12/30/2020 FINDINGS: Lower chest: No acute pleural or parenchymal lung disease. Hepatobiliary: No focal liver abnormality is seen. No  gallstones, gallbladder wall thickening, or biliary dilatation. Pancreas: Unremarkable. No pancreatic ductal dilatation or surrounding inflammatory changes. Spleen: Normal in size without focal abnormality. Adrenals/Urinary Tract: Adrenal glands are unremarkable. Kidneys are normal, without renal calculi, focal lesion, or hydronephrosis. Bladder is unremarkable. Stomach/Bowel: No bowel obstruction or ileus. Normal appendix right lower quadrant. No bowel wall thickening or inflammatory change. No significant fecal retention. Small hiatal hernia. Vascular/Lymphatic: Aortic atherosclerosis. No enlarged abdominal or pelvic lymph nodes. Reproductive: Status post hysterectomy. No adnexal masses. Other: No free fluid or free intraperitoneal gas. No abdominal wall hernia. Musculoskeletal: No acute or destructive bony lesions. Reconstructed images demonstrate no additional findings. IMPRESSION: 1. No acute intra-abdominal or intrapelvic process. 2.  Aortic Atherosclerosis (ICD10-I70.0). 3. Small hiatal hernia. Electronically Signed   By: Sharlet Salina M.D.   On: 05/03/2022 15:11    Procedures Procedures   Medications Ordered in  ED Medications  iohexol (OMNIPAQUE) 300 MG/ML solution 100 mL (100 mLs Intravenous Contrast Given 05/03/22 1449)  HYDROcodone-acetaminophen (NORCO/VICODIN) 5-325 MG per tablet 2 tablet (2 tablets Oral Given 05/03/22 1635)    ED Course/ Medical Decision Making/ A&P Clinical Course as of 05/03/22 1715  Mon May 03, 2022  1713 CTAP negative for acute intraabdominal process. Urinalysis concerning for UTI. Will treat given location of pain, mild elevated WBC count. Will prescribe cefdinir. Patient tolerated ceftriaxone in the past. Will discharge to home, patient comfortable with discharge to home. All questions answered [WS]    Clinical Course User Index [WS] Lonell Grandchild, MD                           Medical Decision Making Amount and/or Complexity of Data Reviewed Labs:  ordered. Radiology: ordered.  Risk OTC drugs. Prescription drug management.   71 year old female presenting to the emergency department for abdominal pain.  Physical exam with mild left lower quadrant tenderness.  Differential includes diverticulitis, constipation, abscess, small bowel obstruction,UTI.  Will obtain CT imaging to further evaluate.  Will treat pain with Tylenol.  Reviewed patient's prior records including discharge summary from 12/31/2020.  Patient denies any urinary symptoms but will check urinalysis.  Final Clinical Impression(s) / ED Diagnoses Final diagnoses:  Urinary tract infection without hematuria, site unspecified    Rx / DC Orders ED Discharge Orders          Ordered    cefdinir (OMNICEF) 300 MG capsule  2 times daily        05/03/22 1649              Lonell Grandchild, MD 05/03/22 1715

## 2022-05-03 NOTE — ED Triage Notes (Signed)
Pt arrived [facility] from [facility].   C/o lower left quadrant abdominal pain.   Last BM 3 days per ems.   Mass/rigidity in left lower quad.   No pain with palpation per ems.   Pt has been taking oxycodone for the past few days. Last taken at 6am.    EMS VS: BP-144/92 P-94 RR-18 94% Ra  CBG-94  A/O at her baseline  Hx: schizophrenia   Ambulatory with ems

## 2022-05-05 ENCOUNTER — Encounter (HOSPITAL_BASED_OUTPATIENT_CLINIC_OR_DEPARTMENT_OTHER): Payer: Self-pay | Admitting: Emergency Medicine

## 2022-05-05 LAB — URINE CULTURE: Culture: 20000 — AB

## 2022-05-06 ENCOUNTER — Telehealth: Payer: Self-pay | Admitting: *Deleted

## 2022-05-06 NOTE — Telephone Encounter (Signed)
Post ED Visit - Positive Culture Follow-up  Culture report reviewed by antimicrobial stewardship pharmacist: [facility] []  , Pharm.D. []  Enzo Bi, Pharm.D., BCPS AQ-ID []  , Pharm.D., BCPS [x]  Celedonio Miyamoto, Pharm.D., BCPS []  [place], Garvin Fila.D., BCPS, AAHIVP []  , Pharm.D., BCPS, AAHIVP []  Georgina Pillion, PharmD, BCPS []  , PharmD, BCPS []  [place], PharmD, BCPS []  [address], PharmD []  , PharmD, BCPS []  Estella Husk, PharmD  Pharmacy Team []  Lysle Pearl, PharmD []  , PharmD []  Phillips Climes, PharmD []  , Rph []  Agapito Games) , PharmD []  Verlan Friends, PharmD []  , PharmD []  Mervyn Gay, PharmD []  , PharmD []  Vinnie Level, PharmD []  [facility], PharmD []  , PharmD []  Len Childs, PharmD   Positive urine culture Contaminant, stop antibiotic treatment and no further patient follow-up is required at this time. , PA-C  Greer Pickerel [place] 05/06/2022, 8:36 AM

## 2022-05-06 NOTE — Progress Notes (Signed)
ED Antimicrobial Stewardship Positive Culture Follow Up   Danielle [name] is an 71 y.o. female who presented to [facility] on 05/03/2022 with a chief complaint of  Chief Complaint  Patient presents with   Abdominal Pain    Recent Results (from the past 720 hour(s))  Urine Culture     Status: Abnormal   Collection Time: 05/03/22  4:52 PM   Specimen: Urine, Clean Catch  Result Value Ref Range Status   Specimen Description   Final    URINE, CLEAN CATCH Performed at [facility], [address] [place], [place] [address]    Special Requests   Final    NONE Performed at [facility], [address], [place], [place] [address]    Culture (A)  Final    20,000 COLONIES/mL LACTOBACILLUS SPECIES Standardized susceptibility testing for this organism is not available. Performed at [facility], [address] [place], [place] [address]    Report Status 05/05/2022 FINAL  Final   Patient appeared in ED on 7/17 w/ c/o lower quadrant abdominal pain, nausea, and constipation that had been occurring for approximately 2 days. No frequency, urgency, fever, or dysuria reported. A CT of her abdomen was performed and it showed no abnormalities suggestive of infxn. On discharge patient was given cefdinir 300 mg caps BID x 7 days.   The patient's UA on 7/17 showed the following: Nitrite: neg, Leuko: large, WBC: 21-50, epithelial cells: 0-5. This is not strongly indicative of a UTI. Further, the patient's culture yielded 20,000 colonies/mL of lactobacillus, which is likely a contaminant given that it is part of normal vaginal flora.   Spoke to provider and it was agreed that d/t patient's lack of infxn symptoms, her UA not being strongly indicative of UTI, and the fact that her culture yielded a likely contaminant in a small amount, abx are not necessary at this time. Patient's facility ([address] [place] [facility] [place]) should be contacted to  discontinue   cefdinir.  New antibiotic prescription: d/c cefdinir  ED Provider: Barrie Dunker   Zella Ball 05/06/2022, 8:10 AM PharmD Candidate Monday - Friday phone -  [phone] Saturday - Sunday phone - [phone]

## 2022-05-10 ENCOUNTER — Inpatient Hospital Stay (HOSPITAL_COMMUNITY)
Admission: EM | Admit: 2022-05-10 | Discharge: 2022-05-14 | DRG: 377 | Disposition: A | Discharge status: Home Health | Payer: Medicare Other | Attending: Internal Medicine | Admitting: Internal Medicine

## 2022-05-10 DIAGNOSIS — E861 Hypovolemia: Secondary | ICD-10-CM | POA: Diagnosis present

## 2022-05-10 DIAGNOSIS — F319 Bipolar disorder, unspecified: Secondary | ICD-10-CM | POA: Diagnosis present

## 2022-05-10 DIAGNOSIS — E8809 Other disorders of plasma-protein metabolism, not elsewhere classified: Secondary | ICD-10-CM | POA: Diagnosis present

## 2022-05-10 DIAGNOSIS — I110 Hypertensive heart disease with heart failure: Secondary | ICD-10-CM | POA: Diagnosis present

## 2022-05-10 DIAGNOSIS — K922 Gastrointestinal hemorrhage, unspecified: Secondary | ICD-10-CM | POA: Diagnosis not present

## 2022-05-10 DIAGNOSIS — Z90710 Acquired absence of both cervix and uterus: Secondary | ICD-10-CM

## 2022-05-10 DIAGNOSIS — I509 Heart failure, unspecified: Secondary | ICD-10-CM | POA: Diagnosis present

## 2022-05-10 DIAGNOSIS — E876 Hypokalemia: Secondary | ICD-10-CM | POA: Diagnosis present

## 2022-05-10 DIAGNOSIS — Z87891 Personal history of nicotine dependence: Secondary | ICD-10-CM

## 2022-05-10 DIAGNOSIS — K2100 Gastro-esophageal reflux disease with esophagitis, without bleeding: Secondary | ICD-10-CM | POA: Diagnosis present

## 2022-05-10 DIAGNOSIS — F1021 Alcohol dependence, in remission: Secondary | ICD-10-CM | POA: Diagnosis present

## 2022-05-10 DIAGNOSIS — R5381 Other malaise: Secondary | ICD-10-CM | POA: Diagnosis present

## 2022-05-10 DIAGNOSIS — J45909 Unspecified asthma, uncomplicated: Secondary | ICD-10-CM | POA: Diagnosis present

## 2022-05-10 DIAGNOSIS — K254 Chronic or unspecified gastric ulcer with hemorrhage: Principal | ICD-10-CM | POA: Diagnosis present

## 2022-05-10 DIAGNOSIS — R578 Other shock: Secondary | ICD-10-CM | POA: Diagnosis present

## 2022-05-10 DIAGNOSIS — I9589 Other hypotension: Secondary | ICD-10-CM | POA: Diagnosis present

## 2022-05-10 DIAGNOSIS — Z720 Tobacco use: Secondary | ICD-10-CM | POA: Diagnosis present

## 2022-05-10 DIAGNOSIS — Z79899 Other long term (current) drug therapy: Secondary | ICD-10-CM

## 2022-05-10 DIAGNOSIS — I2510 Atherosclerotic heart disease of native coronary artery without angina pectoris: Secondary | ICD-10-CM | POA: Diagnosis present

## 2022-05-10 DIAGNOSIS — R7989 Other specified abnormal findings of blood chemistry: Secondary | ICD-10-CM

## 2022-05-10 DIAGNOSIS — F200 Paranoid schizophrenia: Secondary | ICD-10-CM | POA: Diagnosis present

## 2022-05-10 DIAGNOSIS — K449 Diaphragmatic hernia without obstruction or gangrene: Secondary | ICD-10-CM | POA: Diagnosis present

## 2022-05-10 DIAGNOSIS — Z8249 Family history of ischemic heart disease and other diseases of the circulatory system: Secondary | ICD-10-CM

## 2022-05-10 DIAGNOSIS — D62 Acute posthemorrhagic anemia: Secondary | ICD-10-CM

## 2022-05-10 DIAGNOSIS — Z8673 Personal history of transient ischemic attack (TIA), and cerebral infarction without residual deficits: Secondary | ICD-10-CM

## 2022-05-10 DIAGNOSIS — Z823 Family history of stroke: Secondary | ICD-10-CM

## 2022-05-10 DIAGNOSIS — N179 Acute kidney failure, unspecified: Secondary | ICD-10-CM | POA: Diagnosis present

## 2022-05-10 DIAGNOSIS — E785 Hyperlipidemia, unspecified: Secondary | ICD-10-CM | POA: Diagnosis present

## 2022-05-10 DIAGNOSIS — Z833 Family history of diabetes mellitus: Secondary | ICD-10-CM

## 2022-05-10 DIAGNOSIS — R799 Abnormal finding of blood chemistry, unspecified: Secondary | ICD-10-CM

## 2022-05-10 NOTE — ED Provider Notes (Signed)
[facility] EMERGENCY DEPARTMENT Provider Note   CSN: [id number] Arrival date & time: 05/10/22  2352     History  Chief complaint: GI bleed, hypotension.  Danielle [name] is a 71 y.o. female.  The history is provided by the patient and the EMS personnel. The history is limited by the condition of the patient (Acuity of her condition).  She has history of hypertension, hyperlipidemia, coronary artery disease, substance abuse, schizophrenia and is brought in by ambulance after apparent GI bleed.  They were called to the home for a fall, found the patient with dried blood all over her.  EMS noted blood pressure of 77 systolic, were only able to get IV access in her right foot.  Patient does admit to bloody stool and also hematemesis.  However, patient is only able to nod her head yes or no, and has been nonverbal otherwise.  She denies hurting anywhere and denies dyspnea and denies chest pain.  She denies history of bleeding in the past.  She is not on any anticoagulants.   Home Medications Prior to Admission medications   Medication Sig Start Date End Date Taking? Authorizing Provider  albuterol (PROVENTIL HFA;VENTOLIN HFA) 108 (90 Base) MCG/ACT inhaler Inhale 1-2 puffs into the lungs every 6 (six) hours as needed for wheezing or shortness of breath (or cough). 12/31/18   Ronnie Doss A, PA-C  albuterol (PROVENTIL) (2.5 MG/3ML) 0.083% nebulizer solution Take 2.5 mg by nebulization every 6 (six) hours as needed for wheezing or shortness of breath.    [provider]  amLODipine (NORVASC) 5 MG tablet Take 5 mg by mouth daily.    [provider]  atorvastatin (LIPITOR) 40 MG tablet Take 40 mg by mouth daily.    [provider]  cefdinir (OMNICEF) 300 MG capsule Take 1 capsule (300 mg total) by mouth 2 (two) times daily for 7 days. 05/03/22 05/10/22  Lonell Grandchild, MD  hydrOXYzine (VISTARIL) 25 MG capsule Take 25 mg by mouth every 8 (eight) hours  as needed for anxiety.    [provider]  pregabalin (LYRICA) 150 MG capsule Take 150 mg by mouth 2 (two) times daily.    [provider]  QUEtiapine (SEROQUEL) 100 MG tablet Take 1 tablet (100 mg total) by mouth daily. 03/23/19   Theotis Barrio, MD  sertraline (ZOLOFT) 100 MG tablet Take 100 mg by mouth at bedtime.    [provider]  tiZANidine (ZANAFLEX) 2 MG tablet Take 2 mg by mouth every 8 (eight) hours as needed for muscle spasms.    [provider]  valbenazine (INGREZZA) 40 MG capsule Take 40 mg by mouth daily.    [provider]      Allergies    Clonazepam, Doxycycline, Fosinopril sodium, Hydrochlorothiazide w-triamterene, Penicillins, and Sulfa antibiotics    Review of Systems   Review of Systems  Unable to perform ROS: Acuity of condition  All other systems reviewed and are negative.   Physical Exam Updated Vital Signs BP (!) 86/49   Pulse (!) 112   Temp 98 F (36.7 C) (Oral)   Resp (!) 23   SpO2 96%  Physical Exam Vitals and nursing note reviewed.   71 year old female, resting comfortably and in no acute distress. Vital signs are significant for rapid heart rate and low blood pressure and elevated respiratory rate. Oxygen saturation is 96%, which is normal. Head is normocephalic and atraumatic. PERRLA, EOMI. Oropharynx is clear.  Dried blood is  noted on her lips. Neck is nontender and supple without adenopathy or JVD. Back is nontender and there is no CVA tenderness. Lungs are clear without rales, wheezes, or rhonchi. Chest is nontender. Heart is tachycardic without murmur. Abdomen is soft, flat, nontender. Extremities have no cyanosis or edema, full range of motion is present. Skin is warm and dry without rash. Neurologic: Awake and alert, able to nod her head yes and no to questions but is otherwise nonverbal, cranial nerves are intact, moves all extremities equally.  ED Results / Procedures / Treatments   Labs (all  labs ordered are listed, but only abnormal results are displayed) Labs Reviewed  CBC - Abnormal; Notable for the following components:      Result Value   WBC 20.3 (*)    RBC 1.62 (*)    Hemoglobin 4.8 (*)    HCT 15.7 (*)    All other components within normal limits  LACTIC ACID, PLASMA - Abnormal; Notable for the following components:   Lactic Acid, Venous 4.9 (*)    All other components within normal limits  COMPREHENSIVE METABOLIC PANEL - Abnormal; Notable for the following components:   Potassium 3.2 (*)    CO2 20 (*)    Glucose, Bld 180 (*)    BUN 51 (*)    Creatinine, Ser 1.13 (*)    Calcium 8.3 (*)    Total Protein 3.7 (*)    Albumin 1.9 (*)    Alkaline Phosphatase 34 (*)    Total Bilirubin <0.1 (*)    GFR, Estimated 53 (*)    All other components within normal limits  PROTIME-INR - Abnormal; Notable for the following components:   Prothrombin Time 17.7 (*)    INR 1.5 (*)    All other components within normal limits  CBG MONITORING, ED - Abnormal; Notable for the following components:   Glucose-Capillary 232 (*)    All other components within normal limits  I-STAT CHEM 8, ED - Abnormal; Notable for the following components:   BUN 50 (*)    Creatinine, Ser 1.10 (*)    Glucose, Bld 223 (*)    TCO2 15 (*)    Hemoglobin 5.4 (*)    HCT 16.0 (*)    All other components within normal limits  LACTIC ACID, PLASMA  POC OCCULT BLOOD, ED  TYPE AND SCREEN  PREPARE RBC (CROSSMATCH)  ABO/RH  PREPARE RBC (CROSSMATCH)    EKG EKG Interpretation  Date/Time:  Monday May 10 2022 23:56:19 EDT Ventricular Rate:  117 PR Interval:  144 QRS Duration: 62 QT Interval:  352 QTC Calculation: 492 R Axis:   64 Text Interpretation: Sinus tachycardia Abnormal R-wave progression, early transition Borderline repolarization abnormality Borderline prolonged QT interval No old tracing to compare Confirmed by Dione Booze ([id number]) on 05/11/2022 1:11:32 AM  Radiology DG Chest Port 1  View  Result Date: 05/11/2022 CLINICAL DATA:  Femoral central line EXAM: PORTABLE CHEST 1 VIEW COMPARISON:  12/29/2020 FINDINGS: Lungs are clear.  No pleural effusion or pneumothorax. The heart is normal in size. IMPRESSION: No evidence of acute cardiopulmonary disease. Electronically Signed   By: Charline Bills M.D.   On: 05/11/2022 02:26    Procedures .Central Line  Date/Time: 05/11/2022 1:05 AM  Performed by: Dione Booze, MD Authorized by: Dione Booze, MD   Consent:    Consent obtained:  Emergent situation   Consent given by:  Patient   Risks, benefits, and alternatives were discussed: yes     Risks discussed:  Arterial puncture, incorrect placement, bleeding and infection   Alternatives discussed:  No treatment Universal protocol:    Procedure explained and questions answered to patient or proxy's satisfaction: yes     Relevant documents present and verified: yes     Required blood products, implants, devices, and special equipment available: yes     Site/side marked: yes     Immediately prior to procedure, a time out was called: yes     Patient identity confirmed:  Verbally with patient and arm band Pre-procedure details:    Indication(s): insufficient peripheral access     Hand hygiene: Hand hygiene performed prior to insertion     Sterile barrier technique: All elements of maximal sterile technique followed     Skin preparation:  Chlorhexidine   Skin preparation agent: Skin preparation agent completely dried prior to procedure   Sedation:    Sedation type:  None Anesthesia:    Anesthesia method:  Local infiltration   Local anesthetic:  Lidocaine 1% w/o epi Procedure details:    Location:  R femoral   Site selection rationale:  Ease of access, no risk of pneumothorax   Patient position:  Supine   Procedural supplies:  Triple lumen   Catheter size:  7 Fr   Landmarks identified: yes     Ultrasound guidance: yes     Ultrasound guidance timing: real time     Sterile  ultrasound techniques: Sterile gel and sterile probe covers were used     Number of attempts:  2   Successful placement: yes   Post-procedure details:    Post-procedure:  Dressing applied and line sutured   Assessment:  Blood return through all ports and free fluid flow   Procedure completion:  Tolerated well, no immediate complications   Complications:  Arterial puncture with first attempt with bleeding controlled with direct pressure.   Cardiac monitor shows sinus tachycardia, per my interpretation.  Medications Ordered in ED Medications  0.9 %  sodium chloride infusion (10 mL/hr Intravenous Not Given 05/11/22 0131)  0.9 %  sodium chloride infusion (0 mL/hr Intravenous Hold 05/11/22 0200)  pantoprazole (PROTONIX) 80 mg /NS 100 mL IVPB (has no administration in time range)  pantoprozole (PROTONIX) 80 mg /NS 100 mL infusion (has no administration in time range)  potassium chloride SA (KLOR-CON M) CR tablet 40 mEq (has no administration in time range)  lactated ringers bolus 1,000 mL (0 mLs Intravenous Stopped 05/11/22 0124)  lactated ringers bolus 1,000 mL (0 mLs Intravenous Stopped 05/11/22 0203)    ED Course/ Medical Decision Making/ A&P                           Medical Decision Making Amount and/or Complexity of Data Reviewed Labs: ordered. Radiology: ordered.  Risk Prescription drug management.   GI bleed, apparently upper.  Source is unclear but possibilities include peptic ulcer disease, gastritis, esophageal varices, Mallory-Weiss tear.  We were able to get a peripheral IV line in her left hand, but it was not felt to be adequate so a central line was placed.  Line was placed in the right femoral area of an initial attempt ended up with an arterial puncture, but IV access was successfully obtained.  She is given aggressive IV hydration.  Initial hemoglobin has come back at 4.8, blood has been ordered for crossmatch and transfusion.  Will empirically start on pantoprazole bolus  and drip.  Old records are reviewed, CT of abdomen and  pelvis on 05/03/2022 showed no evidence of any liver disease, I see no prior GI procedures.  Blood pressure has come up with IV fluids although she remains somewhat tachycardic.  She is currently getting blood transfusion.  I have obtained additional history, patient does admit to ibuprofen use although it is difficult to get a precise amount, also admits aspirin use and some upper abdominal pain.  Given this history, most likely source of bleeding is either peptic ulcer or gastritis.  She has had no hematemesis or stool since arriving in the ED.  She is now hemodynamically stable.  I have reviewed and interpreted her ECG, and my interpretation is sinus tachycardia with borderline repolarization abnormality, no prior tracing available for comparison.  Portable chest x-ray showed no acute cardiopulmonary disease.  I have independently viewed the image, and agree with the radiologist's interpretation.  I reviewed and interpreted laboratory testing and my interpretation is mild hypokalemia, elevated BUN consistent with upper GI bleed, hypoalbuminemia likely from chronic nutritional deficiency, elevated lactic acid level which is felt to be from hypotension and not sepsis, hemoglobin 4.8 consistent with significant GI bleed, leukocytosis which is felt to be secondary to physiologic stress and not indicative of infection, borderline elevated INR of doubtful clinical significance.  She is blood type O+.  I have ordered oral potassium.  I have sent a secure chat to Dr. Lavon Paganini, on-call for gastroenterology to see the patient in consultation.  At this point, I do not see a need for emergent endoscopy.  Case is discussed with Dr. Imogene Burn of [facility] hospitalist, who agrees to admit the patient.  CRITICAL CARE Performed by: Dione Booze Total critical care time: 125 minutes Critical care time was exclusive of separately billable procedures and treating other  patients. Critical care was necessary to treat or prevent imminent or life-threatening deterioration. Critical care was time spent personally by me on the following activities: development of treatment plan with patient and/or surrogate as well as nursing, discussions with consultants, evaluation of patient's response to treatment, examination of patient, obtaining history from patient or surrogate, ordering and performing treatments and interventions, ordering and review of laboratory studies, ordering and review of radiographic studies, pulse oximetry and re-evaluation of patient's condition.  Final Clinical Impression(s) / ED Diagnoses Final diagnoses:  Upper gastrointestinal bleeding  Hypotension due to hypovolemia  Anemia due to acute blood loss  Elevated lactic acid level  Hypokalemia  Elevated BUN  Hypoalbuminemia    Rx / DC Orders ED Discharge Orders     None         Dione Booze, MD 05/11/22 [phone]

## 2022-05-10 NOTE — ED Notes (Signed)
ED Provider at bedside. 

## 2022-05-10 NOTE — ED Provider Notes (Incomplete)
[facility] EMERGENCY DEPARTMENT Provider Note   CSN: [id number] Arrival date & time: 05/10/22  2352     History {Add pertinent medical, surgical, social history, OB history to HPI:1} No chief complaint on file.   Danielle [name] is a 71 y.o. female.  The history is provided by the patient.  She has history of hypertension, hyperlipidemia, coronary artery disease, substance abuse, schizophrenia   Home Medications Prior to Admission medications   Medication Sig Start Date End Date Taking? Authorizing Provider  albuterol (PROVENTIL HFA;VENTOLIN HFA) 108 (90 Base) MCG/ACT inhaler Inhale 1-2 puffs into the lungs every 6 (six) hours as needed for wheezing or shortness of breath (or cough). 12/31/18   Ronnie Doss A, PA-C  albuterol (PROVENTIL) (2.5 MG/3ML) 0.083% nebulizer solution Take 2.5 mg by nebulization every 6 (six) hours as needed for wheezing or shortness of breath.    [provider]  amLODipine (NORVASC) 5 MG tablet Take 5 mg by mouth daily.    [provider]  atorvastatin (LIPITOR) 40 MG tablet Take 40 mg by mouth daily.    [provider]  cefdinir (OMNICEF) 300 MG capsule Take 1 capsule (300 mg total) by mouth 2 (two) times daily for 7 days. 05/03/22 05/10/22  Lonell Grandchild, MD  hydrOXYzine (VISTARIL) 25 MG capsule Take 25 mg by mouth every 8 (eight) hours as needed for anxiety.    [provider]  pregabalin (LYRICA) 150 MG capsule Take 150 mg by mouth 2 (two) times daily.    [provider]  QUEtiapine (SEROQUEL) 100 MG tablet Take 1 tablet (100 mg total) by mouth daily. 03/23/19   Theotis Barrio, MD  sertraline (ZOLOFT) 100 MG tablet Take 100 mg by mouth at bedtime.    [provider]  tiZANidine (ZANAFLEX) 2 MG tablet Take 2 mg by mouth every 8 (eight) hours as needed for muscle spasms.    [provider]  valbenazine (INGREZZA) 40 MG capsule Take 40 mg by mouth daily.    [provider]      Allergies    Clonazepam, Doxycycline, Fosinopril sodium, Hydrochlorothiazide w-triamterene, Penicillins, and Sulfa antibiotics    Review of Systems   Review of Systems  All other systems reviewed and are negative.   Physical Exam Updated Vital Signs There were no vitals taken for this visit. Physical Exam Vitals and nursing note reviewed.   71 year old ***female, resting comfortably and in no acute distress. Vital signs are ***. Oxygen saturation is ***%, which is normal. Head is normocephalic and atraumatic. PERRLA, EOMI. Oropharynx is clear. Neck is nontender and supple without adenopathy or JVD. Back is nontender and there is no CVA tenderness. Lungs are clear without rales, wheezes, or rhonchi. Chest is nontender. Heart has regular rate and rhythm without murmur. Abdomen is soft, flat, nontender without masses or hepatosplenomegaly and peristalsis is normoactive. Extremities have no cyanosis or edema, full range of motion is present. Skin is warm and dry without rash. Neurologic: Mental status is normal, cranial nerves are intact, there are no motor or sensory deficits.  ED Results / Procedures / Treatments   Labs (all labs ordered are listed, but only abnormal results are displayed) Labs Reviewed - No data to display  EKG None  Radiology No results found.  Procedures Procedures  {Document cardiac monitor, telemetry assessment procedure when appropriate:1}  Medications Ordered in ED Medications - No data to display  ED Course/ Medical Decision Making/ A&P  Medical Decision Making  ***  {Document critical care time when appropriate:1} {Document review of labs and clinical decision tools ie heart score, Chads2Vasc2 etc:1}  {Document your independent review of radiology images, and any outside records:1} {Document your discussion with family members, caretakers, and with consultants:1} {Document social determinants  of health affecting pt's care:1} {Document your decision making why or why not admission, treatments were needed:1} Final Clinical Impression(s) / ED Diagnoses Final diagnoses:  None    Rx / DC Orders ED Discharge Orders     None

## 2022-05-11 ENCOUNTER — Other Ambulatory Visit: Payer: Self-pay

## 2022-05-11 ENCOUNTER — Inpatient Hospital Stay (HOSPITAL_COMMUNITY): Payer: Medicare Other | Admitting: Certified Registered Nurse Anesthetist

## 2022-05-11 ENCOUNTER — Encounter (HOSPITAL_COMMUNITY)
Admission: EM | Disposition: A | Discharge status: Home Health | Payer: Self-pay | Source: Home / Self Care | Attending: Internal Medicine

## 2022-05-11 ENCOUNTER — Emergency Department (HOSPITAL_COMMUNITY): Payer: Medicare Other

## 2022-05-11 ENCOUNTER — Encounter (HOSPITAL_COMMUNITY): Payer: Self-pay | Admitting: Internal Medicine

## 2022-05-11 DIAGNOSIS — K259 Gastric ulcer, unspecified as acute or chronic, without hemorrhage or perforation: Secondary | ICD-10-CM

## 2022-05-11 DIAGNOSIS — I9589 Other hypotension: Secondary | ICD-10-CM | POA: Diagnosis present

## 2022-05-11 DIAGNOSIS — E861 Hypovolemia: Secondary | ICD-10-CM | POA: Diagnosis present

## 2022-05-11 DIAGNOSIS — Z8673 Personal history of transient ischemic attack (TIA), and cerebral infarction without residual deficits: Secondary | ICD-10-CM

## 2022-05-11 DIAGNOSIS — E8809 Other disorders of plasma-protein metabolism, not elsewhere classified: Secondary | ICD-10-CM | POA: Diagnosis present

## 2022-05-11 DIAGNOSIS — K449 Diaphragmatic hernia without obstruction or gangrene: Secondary | ICD-10-CM

## 2022-05-11 DIAGNOSIS — R5381 Other malaise: Secondary | ICD-10-CM | POA: Diagnosis present

## 2022-05-11 DIAGNOSIS — K254 Chronic or unspecified gastric ulcer with hemorrhage: Secondary | ICD-10-CM

## 2022-05-11 DIAGNOSIS — J45909 Unspecified asthma, uncomplicated: Secondary | ICD-10-CM | POA: Diagnosis present

## 2022-05-11 DIAGNOSIS — K922 Gastrointestinal hemorrhage, unspecified: Secondary | ICD-10-CM | POA: Diagnosis present

## 2022-05-11 DIAGNOSIS — F200 Paranoid schizophrenia: Secondary | ICD-10-CM | POA: Diagnosis present

## 2022-05-11 DIAGNOSIS — I110 Hypertensive heart disease with heart failure: Secondary | ICD-10-CM | POA: Diagnosis present

## 2022-05-11 DIAGNOSIS — K2100 Gastro-esophageal reflux disease with esophagitis, without bleeding: Secondary | ICD-10-CM | POA: Diagnosis present

## 2022-05-11 DIAGNOSIS — F319 Bipolar disorder, unspecified: Secondary | ICD-10-CM | POA: Diagnosis present

## 2022-05-11 DIAGNOSIS — K920 Hematemesis: Secondary | ICD-10-CM | POA: Diagnosis not present

## 2022-05-11 DIAGNOSIS — R578 Other shock: Secondary | ICD-10-CM | POA: Diagnosis present

## 2022-05-11 DIAGNOSIS — N179 Acute kidney failure, unspecified: Secondary | ICD-10-CM | POA: Diagnosis present

## 2022-05-11 DIAGNOSIS — Z720 Tobacco use: Secondary | ICD-10-CM

## 2022-05-11 DIAGNOSIS — Z823 Family history of stroke: Secondary | ICD-10-CM | POA: Diagnosis not present

## 2022-05-11 DIAGNOSIS — I2510 Atherosclerotic heart disease of native coronary artery without angina pectoris: Secondary | ICD-10-CM | POA: Diagnosis present

## 2022-05-11 DIAGNOSIS — Z87891 Personal history of nicotine dependence: Secondary | ICD-10-CM | POA: Diagnosis not present

## 2022-05-11 DIAGNOSIS — E785 Hyperlipidemia, unspecified: Secondary | ICD-10-CM | POA: Diagnosis present

## 2022-05-11 DIAGNOSIS — Z90710 Acquired absence of both cervix and uterus: Secondary | ICD-10-CM | POA: Diagnosis not present

## 2022-05-11 DIAGNOSIS — Z833 Family history of diabetes mellitus: Secondary | ICD-10-CM | POA: Diagnosis not present

## 2022-05-11 DIAGNOSIS — Z8249 Family history of ischemic heart disease and other diseases of the circulatory system: Secondary | ICD-10-CM | POA: Diagnosis not present

## 2022-05-11 DIAGNOSIS — F1021 Alcohol dependence, in remission: Secondary | ICD-10-CM | POA: Diagnosis present

## 2022-05-11 DIAGNOSIS — D62 Acute posthemorrhagic anemia: Secondary | ICD-10-CM | POA: Diagnosis present

## 2022-05-11 DIAGNOSIS — E876 Hypokalemia: Secondary | ICD-10-CM | POA: Diagnosis present

## 2022-05-11 HISTORY — PX: HOT HEMOSTASIS: SHX5433

## 2022-05-11 HISTORY — PX: ESOPHAGOGASTRODUODENOSCOPY (EGD) WITH PROPOFOL: SHX5813

## 2022-05-11 HISTORY — PX: BIOPSY: SHX5522

## 2022-05-11 HISTORY — PX: HEMOSTASIS CONTROL: SHX6838

## 2022-05-11 LAB — CBC
HCT: 15.7 % — ABNORMAL LOW (ref 36.0–46.0)
Hemoglobin: 4.8 g/dL — CL (ref 12.0–15.0)
MCH: 29.6 pg (ref 26.0–34.0)
MCHC: 30.6 g/dL (ref 30.0–36.0)
MCV: 96.9 fL (ref 80.0–100.0)
Platelets: 331 10*3/uL (ref 150–400)
RBC: 1.62 MIL/uL — ABNORMAL LOW (ref 3.87–5.11)
RDW: 13.3 % (ref 11.5–15.5)
WBC: 20.3 10*3/uL — ABNORMAL HIGH (ref 4.0–10.5)
nRBC: 0.2 % (ref 0.0–0.2)

## 2022-05-11 LAB — COMPREHENSIVE METABOLIC PANEL
ALT: 13 U/L (ref 0–44)
AST: 18 U/L (ref 15–41)
Albumin: 1.9 g/dL — ABNORMAL LOW (ref 3.5–5.0)
Alkaline Phosphatase: 34 U/L — ABNORMAL LOW (ref 38–126)
Anion gap: 8 (ref 5–15)
BUN: 51 mg/dL — ABNORMAL HIGH (ref 8–23)
CO2: 20 mmol/L — ABNORMAL LOW (ref 22–32)
Calcium: 8.3 mg/dL — ABNORMAL LOW (ref 8.9–10.3)
Chloride: 111 mmol/L (ref 98–111)
Creatinine, Ser: 1.13 mg/dL — ABNORMAL HIGH (ref 0.44–1.00)
GFR, Estimated: 53 mL/min — ABNORMAL LOW (ref >60)
Glucose, Bld: 180 mg/dL — ABNORMAL HIGH (ref 70–99)
Potassium: 3.2 mmol/L — ABNORMAL LOW (ref 3.5–5.1)
Sodium: 139 mmol/L (ref 135–145)
Total Bilirubin: <0.1 mg/dL — ABNORMAL LOW (ref 0.3–1.2)
Total Protein: 3.7 g/dL — ABNORMAL LOW (ref 6.5–8.1)

## 2022-05-11 LAB — I-STAT CHEM 8, ED
BUN: 50 mg/dL — ABNORMAL HIGH (ref 8–23)
Calcium, Ion: 1.16 mmol/L (ref 1.15–1.40)
Chloride: 108 mmol/L (ref 98–111)
Creatinine, Ser: 1.1 mg/dL — ABNORMAL HIGH (ref 0.44–1.00)
Glucose, Bld: 223 mg/dL — ABNORMAL HIGH (ref 70–99)
HCT: 16 % — ABNORMAL LOW (ref 36.0–46.0)
Hemoglobin: 5.4 g/dL — CL (ref 12.0–15.0)
Potassium: 3.8 mmol/L (ref 3.5–5.1)
Sodium: 137 mmol/L (ref 135–145)
TCO2: 15 mmol/L — ABNORMAL LOW (ref 22–32)

## 2022-05-11 LAB — POCT I-STAT, CHEM 8
BUN: 37 mg/dL — ABNORMAL HIGH (ref 8–23)
Calcium, Ion: 1.18 mmol/L (ref 1.15–1.40)
Chloride: 110 mmol/L (ref 98–111)
Creatinine, Ser: 0.8 mg/dL (ref 0.44–1.00)
Glucose, Bld: 120 mg/dL — ABNORMAL HIGH (ref 70–99)
HCT: 28 % — ABNORMAL LOW (ref 36.0–46.0)
Hemoglobin: 9.5 g/dL — ABNORMAL LOW (ref 12.0–15.0)
Potassium: 4.1 mmol/L (ref 3.5–5.1)
Sodium: 141 mmol/L (ref 135–145)
TCO2: 20 mmol/L — ABNORMAL LOW (ref 22–32)

## 2022-05-11 LAB — RAPID URINE DRUG SCREEN, HOSP PERFORMED
Amphetamines: NOT DETECTED
Barbiturates: NOT DETECTED
Benzodiazepines: NOT DETECTED
Cocaine: NOT DETECTED
Opiates: POSITIVE — AB
Tetrahydrocannabinol: NOT DETECTED

## 2022-05-11 LAB — LACTIC ACID, PLASMA
Lactic Acid, Venous: 4.9 mmol/L (ref 0.5–1.9)
Lactic Acid, Venous: 3.2 mmol/L (ref 0.5–1.9)

## 2022-05-11 LAB — HIV ANTIBODY (ROUTINE TESTING W REFLEX): HIV Screen 4th Generation wRfx: NONREACTIVE

## 2022-05-11 LAB — PREPARE RBC (CROSSMATCH)

## 2022-05-11 LAB — CBG MONITORING, ED: Glucose-Capillary: 232 mg/dL — ABNORMAL HIGH (ref 70–99)

## 2022-05-11 LAB — HEMOGLOBIN AND HEMATOCRIT, BLOOD
HCT: 26.9 % — ABNORMAL LOW (ref 36.0–46.0)
Hemoglobin: 9.3 g/dL — ABNORMAL LOW (ref 12.0–15.0)

## 2022-05-11 LAB — PROTIME-INR
INR: 1.5 — ABNORMAL HIGH (ref 0.8–1.2)
Prothrombin Time: 17.7 seconds — ABNORMAL HIGH (ref 11.4–15.2)

## 2022-05-11 LAB — POC OCCULT BLOOD, ED: Fecal Occult Bld: POSITIVE — AB

## 2022-05-11 LAB — ABO/RH: ABO/RH(D): O POS

## 2022-05-11 SURGERY — ESOPHAGOGASTRODUODENOSCOPY (EGD) WITH PROPOFOL
Anesthesia: Monitor Anesthesia Care

## 2022-05-11 MED ORDER — ALBUTEROL SULFATE (2.5 MG/3ML) 0.083% IN NEBU: 3.0000 mL | INHALATION_SOLUTION | Freq: Four times a day (QID) | RESPIRATORY_TRACT | Status: DC | PRN

## 2022-05-11 MED ORDER — SODIUM CHLORIDE 0.9 % IV SOLN: 10.0000 mL/h | Freq: Once | INTRAVENOUS | Status: DC

## 2022-05-11 MED ORDER — PANTOPRAZOLE INFUSION (NEW) - SIMPLE MED
8.0000 mg/h | INTRAVENOUS | Status: AC
  Administered 2022-05-11 – 2022-05-13 (×7): 8 mg/h via INTRAVENOUS
  Filled 2022-05-11 (×2): qty 80
  Filled 2022-05-11: qty 100
  Filled 2022-05-11 (×4): qty 80
  Filled 2022-05-11: qty 100
  Filled 2022-05-11: qty 80

## 2022-05-11 MED ORDER — POTASSIUM CHLORIDE 10 MEQ/100ML IV SOLN
10.0000 meq | INTRAVENOUS | Status: AC
  Administered 2022-05-11 (×3): 10 meq via INTRAVENOUS
  Filled 2022-05-11 (×3): qty 100

## 2022-05-11 MED ORDER — ACETAMINOPHEN 650 MG RE SUPP
650.0000 mg | RECTAL | Status: DC | PRN
  Filled 2022-05-11: qty 1

## 2022-05-11 MED ORDER — PHENYLEPHRINE HCL-NACL 20-0.9 MG/250ML-% IV SOLN
INTRAVENOUS | Status: DC | PRN
  Administered 2022-05-11: 35 ug/min via INTRAVENOUS

## 2022-05-11 MED ORDER — EPINEPHRINE 1 MG/10ML IJ SOSY
PREFILLED_SYRINGE | INTRAMUSCULAR | Status: AC
  Filled 2022-05-11: qty 10

## 2022-05-11 MED ORDER — PHENYLEPHRINE 80 MCG/ML (10ML) SYRINGE FOR IV PUSH (FOR BLOOD PRESSURE SUPPORT)
PREFILLED_SYRINGE | INTRAVENOUS | Status: DC | PRN
  Administered 2022-05-11: 160 ug via INTRAVENOUS
  Administered 2022-05-11: 80 ug via INTRAVENOUS
  Administered 2022-05-11: 160 ug via INTRAVENOUS

## 2022-05-11 MED ORDER — SODIUM CHLORIDE (PF) 0.9 % IJ SOLN
PREFILLED_SYRINGE | INTRAMUSCULAR | Status: DC | PRN
  Administered 2022-05-11: 3.5 mL

## 2022-05-11 MED ORDER — SERTRALINE HCL 100 MG PO TABS
100.0000 mg | ORAL_TABLET | Freq: Every day | ORAL | Status: DC
Start: 2022-05-11 — End: 2022-05-14
  Administered 2022-05-11 – 2022-05-13 (×3): 100 mg via ORAL
  Filled 2022-05-11 (×3): qty 1

## 2022-05-11 MED ORDER — LACTATED RINGERS IV SOLN
INTRAVENOUS | Status: AC | PRN
  Administered 2022-05-11: 20 mL/h via INTRAVENOUS

## 2022-05-11 MED ORDER — NICOTINE 21 MG/24HR TD PT24
21.0000 mg | MEDICATED_PATCH | Freq: Every day | TRANSDERMAL | Status: DC
Start: 2022-05-11 — End: 2022-05-14
  Administered 2022-05-11 – 2022-05-14 (×4): 21 mg via TRANSDERMAL
  Filled 2022-05-11 (×4): qty 1

## 2022-05-11 MED ORDER — ONDANSETRON HCL 4 MG/2ML IJ SOLN
INTRAMUSCULAR | Status: DC | PRN
  Administered 2022-05-11: 4 mg via INTRAVENOUS

## 2022-05-11 MED ORDER — DEXAMETHASONE SODIUM PHOSPHATE 10 MG/ML IJ SOLN
INTRAMUSCULAR | Status: DC | PRN
  Administered 2022-05-11: 5 mg via INTRAVENOUS

## 2022-05-11 MED ORDER — HYDROXYZINE HCL 10 MG PO TABS: 25.0000 mg | ORAL_TABLET | Freq: Three times a day (TID) | ORAL | Status: DC | PRN

## 2022-05-11 MED ORDER — POTASSIUM CHLORIDE CRYS ER 20 MEQ PO TBCR: 40.0000 meq | EXTENDED_RELEASE_TABLET | Freq: Once | ORAL | Status: DC

## 2022-05-11 MED ORDER — PREGABALIN 75 MG PO CAPS
150.0000 mg | ORAL_CAPSULE | Freq: Two times a day (BID) | ORAL | Status: DC
  Administered 2022-05-11 – 2022-05-13 (×4): 150 mg via ORAL
  Filled 2022-05-11 (×4): qty 2

## 2022-05-11 MED ORDER — SODIUM CHLORIDE 0.9 % IV SOLN: INTRAVENOUS | Status: AC

## 2022-05-11 MED ORDER — SUCCINYLCHOLINE CHLORIDE 200 MG/10ML IV SOSY
PREFILLED_SYRINGE | INTRAVENOUS | Status: DC | PRN
  Administered 2022-05-11: 100 mg via INTRAVENOUS

## 2022-05-11 MED ORDER — ONDANSETRON HCL 4 MG/2ML IJ SOLN
4.0000 mg | Freq: Four times a day (QID) | INTRAMUSCULAR | Status: DC | PRN
Start: 2022-05-11 — End: 2022-05-14

## 2022-05-11 MED ORDER — SUCRALFATE 1 GM/10ML PO SUSP
1.0000 g | Freq: Four times a day (QID) | ORAL | Status: DC
  Administered 2022-05-11 – 2022-05-14 (×13): 1 g via ORAL
  Filled 2022-05-11 (×11): qty 10

## 2022-05-11 MED ORDER — VALBENAZINE TOSYLATE 40 MG PO CAPS
40.0000 mg | ORAL_CAPSULE | Freq: Every day | ORAL | Status: DC
  Administered 2022-05-11 – 2022-05-13 (×3): 40 mg via ORAL
  Filled 2022-05-11 (×3): qty 1

## 2022-05-11 MED ORDER — CHLORHEXIDINE GLUCONATE CLOTH 2 % EX PADS
6.0000 | MEDICATED_PAD | Freq: Every day | CUTANEOUS | Status: DC
  Administered 2022-05-11 – 2022-05-14 (×4): 6 via TOPICAL

## 2022-05-11 MED ORDER — QUETIAPINE FUMARATE 100 MG PO TABS
100.0000 mg | ORAL_TABLET | Freq: Every day | ORAL | Status: DC
  Administered 2022-05-11 – 2022-05-13 (×3): 100 mg via ORAL
  Filled 2022-05-11 (×3): qty 1

## 2022-05-11 MED ORDER — PROPOFOL 500 MG/50ML IV EMUL
INTRAVENOUS | Status: DC | PRN
  Administered 2022-05-11: 100 ug/kg/min via INTRAVENOUS

## 2022-05-11 MED ORDER — LIDOCAINE 2% (20 MG/ML) 5 ML SYRINGE
INTRAMUSCULAR | Status: DC | PRN
  Administered 2022-05-11 (×2): 40 mg via INTRAVENOUS

## 2022-05-11 MED ORDER — LACTATED RINGERS IV BOLUS
1000.0000 mL | Freq: Once | INTRAVENOUS | Status: AC
  Administered 2022-05-11: 1000 mL via INTRAVENOUS

## 2022-05-11 MED ORDER — SODIUM CHLORIDE 0.9% IV SOLUTION: Freq: Once | INTRAVENOUS | Status: DC

## 2022-05-11 MED ORDER — PROPOFOL 10 MG/ML IV BOLUS
INTRAVENOUS | Status: DC | PRN
  Administered 2022-05-11 (×3): 10 mg via INTRAVENOUS
  Administered 2022-05-11: 100 mg via INTRAVENOUS

## 2022-05-11 MED ORDER — PANTOPRAZOLE 80MG IVPB - SIMPLE MED
80.0000 mg | Freq: Once | INTRAVENOUS | Status: AC
  Administered 2022-05-11: 80 mg via INTRAVENOUS
  Filled 2022-05-11: qty 80

## 2022-05-11 SURGICAL SUPPLY — 15 items

## 2022-05-11 NOTE — ED Notes (Signed)
Pt transported to Endo at this time via stretcher.

## 2022-05-11 NOTE — ED Notes (Signed)
Spoke to Endo at this time. Report given and Endo aware of pt receiving 3rd unit of RBC. Awaiting call back for confirmation. Pt consent at bedside.

## 2022-05-11 NOTE — ED Notes (Signed)
Provider at bedside at this time to insert central line for patient.

## 2022-05-11 NOTE — Anesthesia Preprocedure Evaluation (Addendum)
Anesthesia Evaluation  Patient identified by MRN, date of birth, ID band Patient awake and Patient confused    Reviewed: Allergy & Precautions, NPO status , Patient's Chart, lab work & pertinent test results  Airway Mallampati: II  TM Distance: >3 FB     Dental   Pulmonary Current Smoker and Patient abstained from smoking.,    breath sounds clear to auscultation       Cardiovascular hypertension, + CAD   Rhythm:Regular Rate:Normal  1. Left ventricular ejection fraction, by estimation, is 60 to 65%. The  left ventricle has normal function. The left ventricle has no regional  wall motion abnormalities. Left ventricular diastolic parameters are  consistent with Grade I diastolic  dysfunction (impaired relaxation).  2. Right ventricular systolic function is normal. The right ventricular  size is normal.  3. The mitral valve is degenerative. Mild to moderate mitral valve  regurgitation. No evidence of mitral stenosis.  4. The aortic valve is normal in structure. Aortic valve regurgitation is  trivial. No aortic stenosis is present.  5. The inferior vena cava is normal in size with greater than 50%  respiratory variability, suggesting right atrial pressure of 3 mmHg.    Neuro/Psych  Headaches, CVA    GI/Hepatic   Endo/Other    Renal/GU      Musculoskeletal   Abdominal   Peds  Hematology   Anesthesia Other Findings   Reproductive/Obstetrics                            Anesthesia Physical Anesthesia Plan  ASA: 4  Anesthesia Plan: MAC   Post-op Pain Management: Minimal or no pain anticipated   Induction:   PONV Risk Score and Plan: 1 and Propofol infusion  Airway Management Planned: Natural Airway and Nasal Cannula  Additional Equipment:   Intra-op Plan:   Post-operative Plan:   Informed Consent: I have reviewed the patients History and Physical, chart, labs and discussed the  procedure including the risks, benefits and alternatives for the proposed anesthesia with the patient or authorized representative who has indicated his/her understanding and acceptance.       Plan Discussed with:   Anesthesia Plan Comments:         Anesthesia Quick Evaluation

## 2022-05-11 NOTE — Assessment & Plan Note (Signed)
Replete with IV KCl since she is NPO.

## 2022-05-11 NOTE — Subjective & Objective (Signed)
CC: hematemesis HPI: 71 year old African-American female history of paranoid schizophrenia, coronary disease, prior history of substance abuse, brought to the ER today due to fall and finding the patient with dried blood all over her.  Patient unable to give any history.  She does not remember arriving to the hospital.  She does not remember vomiting.  Per EDP notes, patient was noted to have a systolic blood pressure of 77.  EDP reports patient was covered in hematemesis.  Due to poor oral and limited IV access, right femoral line was placed with some difficulty by the EDP.  Hemoglobin was 11.9 about 8 days ago when she was in the ER.  Today repeat hemoglobin is 4.8.  Patient was profoundly hypotensive on arrival with a blood pressure 72/49 heart rate of 115.  She was given a liter of IV crystalloid and packed red blood cells transfusion was started.  Patient on Protonix drip after an 80 mg bolus.  EDP has contacted gastroenterology for consult.  [facility] hospitalist contacted for admission.

## 2022-05-11 NOTE — ED Notes (Signed)
Pt assisted up to bedside commode and placed in a clean gown. Pt side rails up x2 and call bell in reach.

## 2022-05-11 NOTE — Consult Note (Addendum)
[facility] Consult: 8:11 AM 05/11/2022  LOS: 0 days    Referring Provider: Dr Raelyn Mora  Primary Care Physician:  [facility] Primary Gastroenterologist:  unassigned     Reason for Consultation:  anemia.  GIB   HPI: Danielle [name] is a 71 y.o. female.  PMH below.  Schizophrenia.  Substance abuse.  HCV Ab positive in 12/2025.  Nothing in the records indicate further work-up or treatment of this.  Chronic small vessel disease, right PCA distribution infarct on head CT 12/2020.  Cardiac cath 10/2017 w 85% lesion at ostial first diagonal otherwise normal coronaries.  LVEF 45 to 50%.  Moderate mitral regurg The only record that emerges after a search for "colonoscopy" is "colonoscopy-predating TAH, normal exam" from a note in 2009.   Assisted living resident.  Ambulance brought her to [facility] on 7/17 complaining of 3 days LLQ abdominal pain, left lower back pain constipation, nausea..  Treating at home with oxycodone. 05/03/2022 CTAP W contrast.  No acute process.  Aortic atherosclerosis.  Small hiatal hernia.  Liver, pancreas, biliary tree unremarkable LLQ tenderness.  UA concerning for UTI, WBCs mildly elevated.  Discharged with prescription for cefdinir. On 7/20 patient's care providers contacted to stop antibiotic treatment as the urine culture was felt to be a contaminant.  Presented midday to [facility] ED yesterday.  She had fallen, there was dried blood "all over her".  SBP 77 per EMS.  IV access only in the foot.  Patient able to give history of bloody stool and hematemesis though having some lethargy, AMS.  Home meds do not include aspirin, NSAIDs, blood thinners of any sort or PPI. Patient tells me that the nausea is unusual for her.  Also recalls having had colonoscopy some years ago but no  details.  When asked about hepatitis C virus, she did not recall this diagnosis or having been treated for hep C.  Hgb 4.8, at ED visit last week it was 11.9.  MCV 96.  Platelets 331.  WBCs 20.3.  INR 1.5. BUNs/creatinine 51/1.1.  A week ago these were 14/0.7.  LFTs not elevated in fact her alk phos is low at 34 and her albumin is quite low at 1.9, bilirubin low at less than 0.1.  FOBT positive Lactic acid 4.9  Notes from 2009 list patient's history of alcoholism from [age] but sober through 2009.  Also mentions use of injected, snorted, smoked cocaine.  Prior history smoker, had quit in 2007.  Family history of MI, stroke, hypertension, alcoholism in mother who died at 71.  Father had diabetes and was an alcoholic died at 46.  6 siblings.  2 sisters have drug and alcohol addiction.  All siblings have hypertension, mental health issues.  Other siblings with a stroke, chronic kidney disease from poorly controlled hypertension on dialysis.  2 children both of whom have bipolar disorder.  Past Medical History:  Diagnosis Date   Asthma    Bipolar affective disorder, currently depressed, mild (HCC)    Chest pain    Complication of anesthesia    "  My eyes swell up "   Coronary artery disease    Depression    Fall 08/25/2012   Recent fall with residual knee and back pain.   Family history of anesthesia complication    " [name] "   Hypertension    Memory disturbance 01/30/2016   Stroke ([place])    Syncope and collapse 03/21/2019   Tardive dyskinesia    Tobacco abuse     Past Surgical History:  Procedure Laterality Date   ABDOMINAL HYSTERECTOMY     CARDIAC CATHETERIZATION     RIGHT/LEFT HEART CATH AND CORONARY ANGIOGRAPHY N/A 08/14/2018   Procedure: RIGHT/LEFT HEART CATH AND CORONARY ANGIOGRAPHY;  Surgeon: Leonie Man, MD;  Location: [facility];  Service: Cardiovascular;  Laterality: N/A;    Prior to Admission medications   Medication Sig Start Date End Date Taking?  Authorizing Provider  albuterol (PROVENTIL HFA;VENTOLIN HFA) 108 (90 Base) MCG/ACT inhaler Inhale 1-2 puffs into the lungs every 6 (six) hours as needed for wheezing or shortness of breath (or cough). 12/31/18   Madilyn Hook A, PA-C  albuterol (PROVENTIL) (2.5 MG/3ML) 0.083% nebulizer solution Take 2.5 mg by nebulization every 6 (six) hours as needed for wheezing or shortness of breath.    [provider]  amLODipine (NORVASC) 5 MG tablet Take 5 mg by mouth daily.    [provider]  atorvastatin (LIPITOR) 40 MG tablet Take 40 mg by mouth daily.    [provider]  hydrOXYzine (VISTARIL) 25 MG capsule Take 25 mg by mouth every 8 (eight) hours as needed for anxiety.    [provider]  pregabalin (LYRICA) 150 MG capsule Take 150 mg by mouth 2 (two) times daily.    [provider]  QUEtiapine (SEROQUEL) 100 MG tablet Take 1 tablet (100 mg total) by mouth daily. 03/23/19   Mosetta Anis, MD  sertraline (ZOLOFT) 100 MG tablet Take 100 mg by mouth at bedtime.    [provider]  tiZANidine (ZANAFLEX) 2 MG tablet Take 2 mg by mouth every 8 (eight) hours as needed for muscle spasms.    [provider]  valbenazine (INGREZZA) 40 MG capsule Take 40 mg by mouth daily.    [provider]    Scheduled Meds:  Infusions:  sodium chloride     sodium chloride Stopped (05/11/22 0200)   pantoprazole 8 mg/hr (05/11/22 0309)   PRN Meds:    Allergies as of 05/10/2022 - Review Complete 05/10/2022  Allergen Reaction Noted   Clonazepam Swelling 08/25/2012   Doxycycline Swelling 05/24/2008   Fosinopril sodium Swelling 10/25/2008   Hydrochlorothiazide w-triamterene Swelling 08/25/2012   Penicillins Swelling 05/24/2008   Sulfa antibiotics Other (See Comments) 01/30/2016    Family History  Problem Relation Age of Onset   Hypertension Mother    Stroke Mother    Heart attack Mother    Heart attack Father    Heart attack Brother    Heart  attack Sister    Dementia Neg Hx     Social History   Socioeconomic History   Marital status: Divorced    Spouse name: Not on file   Number of children: 2   Years of education: 12   Highest education level: Not on file  Occupational History   Occupation: Retired Secretary/administrator  Tobacco Use   Smoking status: Former    Packs/day: 1.00    Types: Cigarettes    Quit date: 08/21/2018    Years since quitting: 3.7  Smokeless tobacco: Never  Vaping Use   Vaping Use: Never used  Substance and Sexual Activity   Alcohol use: Not Currently    Alcohol/week: 0.0 standard drinks of alcohol    Comment: About 1 qt per week   Drug use: Not Currently    Types: Cocaine, Marijuana    Comment: Former user   Sexual activity: Not Currently  Other Topics Concern   Not on file  Social History Narrative   Lives at home alone   Can use both hands   Denies caffeine use   Social Determinants of Health   Financial Resource Strain: Not on file  Food Insecurity: Not on file  Transportation Needs: Not on file  Physical Activity: Not on file  Stress: Not on file  Social Connections: Not on file  Intimate Partner Violence: Not on file    REVIEW OF SYSTEMS: Limited amount of information gained from the patient as she has paucity of speech. Constitutional: No specific weakness, no fatigue ENT:  No nose bleeds Pulm: No shortness of breath. CV:  No palpitations, no LE edema.  No chest pain GU:  No hematuria, no frequency GI: No difficulty swallowing.  Nausea is a new problem. Heme: No unusual bleeding or bruising. Transfusions: None before current encounter. Neuro:  No headaches, no peripheral tingling or numbness.  No seizures, no syncope. Derm:  No itching, no rash or sores.  Endocrine:  No sweats or chills.  No polyuria or dysuria Immunization: Reviewed.  Not a lot of records of any vaccines. Travel:  None mentioned in chart.   PHYSICAL EXAM: Vital signs in last 24 hours: Vitals:    05/11/22 0615 05/11/22 0741  BP: 100/86 103/62  Pulse: (!) 102 95  Resp: (!) 21 20  Temp:  98.1 F (36.7 C)  SpO2: 100% 100%   Wt Readings from Last 3 Encounters:  05/11/22 62 kg  12/30/20 61.2 kg  04/28/19 61.2 kg    General: Patient is calm, resting on bed in the ED.  She is alert and appears comfortable.  Displays paucity of speech but is appropriate. Head: No facial asymmetry or swelling.  No signs of head trauma. Eyes: No scleral icterus or conjunctival pallor. Ears: No obvious hearing deficits Nose: No congestion or discharge Mouth: Very few teeth remain.  Mucosa is moist, pink, clear.  Tongue midline. Neck: No JVD, masses, thyromegaly Lungs: No labored breathing or cough.  Lungs clear bilaterally. Heart: RRR.  No MRG.  S1, S2 present Abdomen: Soft.  Well-healed low midline surgical scar.  No HSM, masses, bruits, hernias appreciated.  Bowel sounds active..   Rectal: Large visible external hemorrhoids, not thrombosed.  No obvious source of bleeding at the hemorrhoids.  Stool is pasty, black and strongly FOBT positive consistent with melena. Musc/Skeltl: No joint swelling, excessive heat or gross deformity. Extremities: No CCE. Neurologic: Alert.  Oriented to hospital, self, her birthday and answers questions appropriately though verbally has paucity of speech.  Follows commands.  Moves all 4 limbs without tremor, strength was not tested. Skin: No obvious sores, lesions or rashes. Nodes: No cervical adenopathy Psych: Pleasant, calm, cooperative.  Intake/Output from previous day: 07/24 0701 - 07/25 0700 In: 615 [Blood:315; IV Piggyback:300] Out: -  Intake/Output this shift: Total I/O In: 450 [I.V.:100; Blood:350] Out: -   LAB RESULTS: Recent Labs    05/11/22 0004 05/11/22 0010  WBC 20.3*  --   HGB 4.8* 5.4*  HCT 15.7* 16.0*  PLT 331  --  BMET Lab Results  Component Value Date   NA 137 05/11/2022   NA 139 05/11/2022   NA 137 05/03/2022   K 3.8 05/11/2022    K 3.2 (L) 05/11/2022   K 3.8 05/03/2022   CL 108 05/11/2022   CL 111 05/11/2022   CL 106 05/03/2022   CO2 20 (L) 05/11/2022   CO2 23 05/03/2022   CO2 29 12/31/2020   GLUCOSE 223 (H) 05/11/2022   GLUCOSE 180 (H) 05/11/2022   GLUCOSE 95 05/03/2022   BUN 50 (H) 05/11/2022   BUN 51 (H) 05/11/2022   BUN 14 05/03/2022   CREATININE 1.10 (H) 05/11/2022   CREATININE 1.13 (H) 05/11/2022   CREATININE 0.73 05/03/2022   CALCIUM 8.3 (L) 05/11/2022   CALCIUM 9.8 05/03/2022   CALCIUM 8.7 (L) 12/31/2020   LFT Recent Labs    05/11/22 0010  PROT 3.7*  ALBUMIN 1.9*  AST 18  ALT 13  ALKPHOS 34*  BILITOT <0.1*   PT/INR Lab Results  Component Value Date   INR 1.5 (H) 05/11/2022   INR 1.4 (H) 12/29/2020   INR 1.08 05/23/2014   Hepatitis Panel No results for input(s): "HEPBSAG", "HCVAB", "HEPAIGM", "HEPBIGM" in the last 72 hours. C-Diff No components found for: "CDIFF" Lipase     Component Value Date/Time   LIPASE 33 05/03/2022 1402    Drugs of Abuse     Component Value Date/Time   LABOPIA NONE DETECTED 12/29/2020 2228   COCAINSCRNUR NONE DETECTED 12/29/2020 2228   COCAINSCRNUR NEG 08/01/2008 2209   LABBENZ NONE DETECTED 12/29/2020 2228   LABBENZ NEG 08/01/2008 2209   AMPHETMU NONE DETECTED 12/29/2020 2228   THCU NONE DETECTED 12/29/2020 2228   LABBARB NONE DETECTED 12/29/2020 2228     RADIOLOGY STUDIES: DG Chest Port 1 View  Result Date: 05/11/2022 CLINICAL DATA:  Femoral central line EXAM: PORTABLE CHEST 1 VIEW COMPARISON:  12/29/2020 FINDINGS: Lungs are clear.  No pleural effusion or pneumothorax. The heart is normal in size. IMPRESSION: No evidence of acute cardiopulmonary disease. Electronically Signed   By: Julian Hy M.D.   On: 05/11/2022 02:26      IMPRESSION:     GIB.  Appears to be upper GIB.      ABL anemia. 2 PRBCs given. 2 aditional units ordered.        HCV Ab + several years ago.  No records regarding treatment or follow-up viral quantitative  studies in the records.  Liver parenchyma unremarkable on CT scan last week.  Platelets nml.  INR elevated at 1.5.    AKI.    Hypotension at arrival.  Improved but mains hypotensive.  Received 2 plus L of LR and NS.    Schizophrenia.  Bipolar disorder.  Recent suspected diagnosis of UTI.  However urine culture returned back within significant both of lactobacillus and patient's caregivers contacted to discontinue cefdinir.    PLAN:       EGD.  Today. D/w pt asnd she is agreeable to proceed and signed the consent. Orders placed.  NPO.  Protonix gtt.  Check HCV quant.    Listed in the patient's facesheet is a daughter Margo Common.  Mobile number [phone].   Azucena Freed  05/11/2022, 8:11 AM Phone [phone]   Attending physician's note   I have taken history, reviewed the chart and examined the patient. I performed a substantive portion of this encounter, including complete performance of at least one of the key components, in conjunction with the APP. I  agree with the Advanced Practitioner's note, impression and recommendations.   Melena s/o UGI bleed. Hb 4.8 s/p 4U PRBC (Was 12, a week ago). HD stable after transfusion. H/O NSAIDs (BC) HCV+ with Nl liver on CT AP. Nl plts/INR H/O polysubstance abuse in past (urine tox screen positive for opioids but was taking oxy) Comorbid conditions include paranoid schizophrenia, CAD, CHF (EF 45%), CVA Note remote H/O colonoscopy ?2009   Plan: -EGD today -IV protonix -Trend CBC -No nonsteroidals -HCV ultraquant/reflex to genotype if + -CT reviewed. -D/W daughter   Carmell Austria, MD [facility] [phone]

## 2022-05-11 NOTE — Progress Notes (Signed)
Patient seen and examined.  She was in the emergency room.  She had received 2 units of blood transfusion and was still feeling little bit dizzy.  Patient is alert awake and oriented.  She has history of paranoid schizophrenia and coronary artery disease and currently at an assisted living facility brought in because of fall.  Patient tells me she felt weak and fell on the ground.  In the emergency room hypotensive with blood pressure 72/49 on arrival, hemoglobin was 4.8 with recent hemoglobin of 11.9.  Had received 2 units of PRBC, ordered 2 more unit of PRBC due to ongoing bleeding.  Currently on Protonix infusion.  Hemodynamically stabilized and resuscitated for endoscopy. Underwent upper GI endoscopy today and found to have large bleeding gastric ulcer.  Will need very close monitoring.  Recheck hemoglobin every 12 hours and transfuse for less than 8 given active GI bleeding.  Hopefully she will stabilize now. Resume her home medications including Seroquel and Zoloft.  Called and updated patient's daughter before her procedure.   Same-day admit.  No charge visit.

## 2022-05-11 NOTE — Assessment & Plan Note (Signed)
Chronic. 

## 2022-05-11 NOTE — Anesthesia Postprocedure Evaluation (Signed)
Anesthesia Post Note  Patient: [name]  Procedure(s) Performed: ESOPHAGOGASTRODUODENOSCOPY (EGD) WITH PROPOFOL HEMOSTASIS CONTROL HOT HEMOSTASIS (ARGON PLASMA COAGULATION/BICAP) BIOPSY     Patient location during evaluation: PACU Anesthesia Type: General Level of consciousness: awake and alert Pain management: pain level controlled Vital Signs Assessment: post-procedure vital signs reviewed and stable Respiratory status: spontaneous breathing, nonlabored ventilation, respiratory function stable and patient connected to nasal cannula oxygen Cardiovascular status: blood pressure returned to baseline and stable Postop Assessment: no apparent nausea or vomiting Anesthetic complications: no   No notable events documented.  Last Vitals:  Vitals:   05/11/22 1242 05/11/22 1314  BP: 107/60 122/67  Pulse: 98 94  Resp: (!) 25 20  Temp:  37.4 C  SpO2: 99% 100%    Last Pain:  Vitals:   05/11/22 1314  TempSrc: Oral  PainSc: 0-No pain                 Kennieth Rad

## 2022-05-11 NOTE — Op Note (Signed)
[facility] Patient Name: Danielle [name] Procedure Date : 05/11/2022 MRN: [id number] Attending MD: Lynann Bologna , MD Date of Birth: 31-Dec-1950 CSN: [id number] Age: 71 Admit Type: Inpatient Procedure:                Upper GI endoscopy Indications:              Melena with drop in Hb from 12 to 4.8. Providers:                Lynann Bologna, MD, Fransisca Connors, Priscella Mann,                            Technician Referring MD:              Medicines:                Monitored Anesthesia Care, General Anesthesia Complications:            No immediate complications. Estimated Blood Loss:     Estimated blood loss was minimal. Procedure:                Pre-Anesthesia Assessment:                           - Prior to the procedure, a History and Physical                            was performed, and patient medications and                            allergies were reviewed. The patient's tolerance of                            previous anesthesia was also reviewed. The risks                            and benefits of the procedure and the sedation                            options and risks were discussed with the patient.                            All questions were answered, and informed consent                            was obtained. Prior Anticoagulants: The patient has                            taken no previous anticoagulant or antiplatelet                            agents. ASA Grade Assessment: III - A patient with                            severe systemic disease. After reviewing the risks  and benefits, the patient was deemed in                            satisfactory condition to undergo the procedure.                           After obtaining informed consent, the endoscope was                            passed under direct vision. Throughout the                            procedure, the patient's blood pressure, pulse, and                             oxygen saturations were monitored continuously. The                            GIF-H190 ([id number]) Olympus endoscope was introduced                            through the mouth, and advanced to the second part                            of duodenum. The upper GI endoscopy was                            accomplished without difficulty. The patient                            tolerated the procedure well. Scope In: Scope Out: Findings:      LA Grade B (one or more mucosal breaks greater than 5 mm, not extending       between the tops of two mucosal folds) esophagitis with no bleeding was       found in the distal esophagus.      A small hiatal hernia was present.      One large (3 cm x 4 cm), deep, cratered gastric ulcer with a visible       vessel and pigmented spots was found at the incisura along the lesser       curvature. The base was fibrotic. Area was successfully injected with       3.5 mL of a 1:10,000 solution of epinephrine followed by thermal       coagulation for hemostasis using 7FR bipolar probe. No bleeding towards       the end of procedure.      Few non-bleeding superficial 6 mm gastric ulcers with no stigmata of       bleeding were found in the gastric body. Few biopsies were taken with a       cold forceps for histology to r/o HP      The examined duodenum was normal. Impression:               - Large gastric ulcer with visible vessel s/p  endoscopic therapy                           - LA Grade B reflux esophagitis with no bleeding.                           - Small hiatal hernia.                           - Few small superficial nonbleeding ulcers in the                            body of the stomach                           - Normal examined duodenum. Recommendation:           - Clear liquid diet today. Do not advance diet x 24                            hours                           - Give Protonix (pantoprazole): 8 mg/hr  IV by                            continuous infusion for 72hrs, followed by PO BID                            until FU EGD.                           - Use sucralfate suspension 1 gram PO QID for 2                            weeks.                           - Trend Hb Q8hrs x 3, then QD                           - If any active bleeding, IR consultation for                            embolization. This would not be amenable to repeat                            endoscopic treatment d/t size, location, morphology.                           - If no further bleeding, recommend repeat EGD in                            12 weeks to ensure healing                           -  Follow biopsies                           - Strictly no NSAIDs                           - The findings and recommendations were discussed                            with the patient's family. Procedure Code(s):        --- Professional ---                           [phone], 59, Esophagogastroduodenoscopy, flexible,                            transoral; with control of bleeding, any method                           43239, Esophagogastroduodenoscopy, flexible,                            transoral; with biopsy, single or multiple Diagnosis Code(s):        --- Professional ---                           K21.00, Gastro-esophageal reflux disease with                            esophagitis, without bleeding                           K44.9, Diaphragmatic hernia without obstruction or                            gangrene                           K25.4, Chronic or unspecified gastric ulcer with                            hemorrhage                           K25.9, Gastric ulcer, unspecified as acute or                            chronic, without hemorrhage or perforation                           K92.1, Melena (includes Hematochezia) CPT copyright 2019 American Medical Association. All rights reserved. The codes documented in this  report are preliminary and upon coder review may  be revised to meet current compliance requirements. Lynann Bologna, MD 05/11/2022 12:14:54 PM This report has been signed electronically. Number of Addenda: 0

## 2022-05-11 NOTE — ED Notes (Signed)
Per MD Preston Fleeting, xray verification is not needed in order to begin using femoral central line. Ok to transfuse blood through central line per MD Preston Fleeting.

## 2022-05-11 NOTE — ED Notes (Signed)
Provider Preston Fleeting attempting to place central line at this time.

## 2022-05-11 NOTE — ED Notes (Signed)
1st attempt at femoral central line unsuccessful. Provider to attempt placement again at this time.

## 2022-05-11 NOTE — H&P (Addendum)
History and Physical    Nusaibah Heidel [id number] DOB: Feb 18, 1951 DOA: 05/10/2022  DOS: the patient was seen and examined on 05/10/2022  [facility]P: [facility] [place]   Patient coming from: Home  I have personally briefly reviewed patient's old medical records in [place]  CC: hematemesis HPI: 71 year old African-American female history of paranoid schizophrenia, coronary disease, prior history of substance abuse, brought to the ER today due to fall and finding the patient with dried blood all over her.  Patient unable to give any history.  She does not remember arriving to the hospital.  She does not remember vomiting.  Per EDP notes, patient was noted to have a systolic blood pressure of 77.  EDP reports patient was covered in hematemesis.  Due to poor oral and limited IV access, right femoral line was placed with some difficulty by the EDP.  Hemoglobin was 11.9 about 8 days ago when she was in the ER.  Today repeat hemoglobin is 4.8.  Patient was profoundly hypotensive on arrival with a blood pressure 72/49 heart rate of 115.  She was given a liter of IV crystalloid and packed red blood cells transfusion was started.  Patient on Protonix drip after an 80 mg bolus.  EDP has contacted gastroenterology for consult.  [facility] contacted for admission.   ED Course: Hemoglobin 4.8.  Started on Protonix drip.  Review of Systems:  Review of Systems  Unable to perform ROS: Mental acuity    Past Medical History:  Diagnosis Date   Asthma    Bipolar affective disorder, currently depressed, mild (HCC)    Chest pain    Complication of anesthesia    " My eyes swell up "   Coronary artery disease    Depression    Fall 08/25/2012   Recent fall with residual knee and back pain.   Family history of anesthesia complication    " [name] "   Hypertension    Memory disturbance 01/30/2016   Stroke ([facility])    Syncope and collapse 03/21/2019   Tardive dyskinesia     Tobacco abuse     Past Surgical History:  Procedure Laterality Date   ABDOMINAL HYSTERECTOMY     CARDIAC CATHETERIZATION     RIGHT/LEFT HEART CATH AND CORONARY ANGIOGRAPHY N/A 08/14/2018   Procedure: RIGHT/LEFT HEART CATH AND CORONARY ANGIOGRAPHY;  Surgeon: Leonie Man, MD;  Location: [facility];  Service: Cardiovascular;  Laterality: N/A;     reports that she quit smoking about 3 years ago. Her smoking use included cigarettes. She smoked an average of 1 pack per day. She has never used smokeless tobacco. She reports that she does not currently use alcohol. She reports that she does not currently use drugs after having used the following drugs: Cocaine and Marijuana.  Allergies  Allergen Reactions   Clonazepam Swelling    Pt was recently given a triamterene hydrochlorothiazide combination as well as clonazepam and had an allergic reaction to one of them. Caused swelling of face and eyes   Doxycycline Swelling    Swelling of face and eyes   Fosinopril Sodium Swelling     swelling face,lips-angioedema from ACE I   Hydrochlorothiazide W-Triamterene Swelling    Pt was recently given a triamterene hydrochlorothiazide combination as well as clonazepam and had an allergic reaction to one of them. Swelling of face and eyes   Penicillins Swelling    Face and eyes Has patient had a PCN reaction causing immediate rash, facial/tongue/throat  swelling, SOB or lightheadedness with hypotension: Yes Has patient had a PCN reaction causing severe rash involving mucus membranes or skin necrosis: No Has patient had a PCN reaction that required hospitalization: No Has patient had a PCN reaction occurring within the last 10 years: No If all of the above answers are "NO", then may proceed with Cephalosporin use.    Sulfa Antibiotics Other (See Comments)    Unknown reaction    Family History  Problem Relation Age of Onset   Hypertension Mother    Stroke Mother    Heart attack Mother     Heart attack Father    Heart attack Brother    Heart attack Sister    Dementia Neg Hx     Prior to Admission medications   Medication Sig Start Date End Date Taking? Authorizing Provider  albuterol (PROVENTIL HFA;VENTOLIN HFA) 108 (90 Base) MCG/ACT inhaler Inhale 1-2 puffs into the lungs every 6 (six) hours as needed for wheezing or shortness of breath (or cough). 12/31/18   Ronnie Doss A, PA-C  albuterol (PROVENTIL) (2.5 MG/3ML) 0.083% nebulizer solution Take 2.5 mg by nebulization every 6 (six) hours as needed for wheezing or shortness of breath.    [provider]  amLODipine (NORVASC) 5 MG tablet Take 5 mg by mouth daily.    [provider]  atorvastatin (LIPITOR) 40 MG tablet Take 40 mg by mouth daily.    [provider]  hydrOXYzine (VISTARIL) 25 MG capsule Take 25 mg by mouth every 8 (eight) hours as needed for anxiety.    [provider]  pregabalin (LYRICA) 150 MG capsule Take 150 mg by mouth 2 (two) times daily.    [provider]  QUEtiapine (SEROQUEL) 100 MG tablet Take 1 tablet (100 mg total) by mouth daily. 03/23/19   Theotis Barrio, MD  sertraline (ZOLOFT) 100 MG tablet Take 100 mg by mouth at bedtime.    [provider]  tiZANidine (ZANAFLEX) 2 MG tablet Take 2 mg by mouth every 8 (eight) hours as needed for muscle spasms.    [provider]  valbenazine (INGREZZA) 40 MG capsule Take 40 mg by mouth daily.    [provider]    Physical Exam: Vitals:   05/11/22 0235 05/11/22 0240 05/11/22 0245 05/11/22 0300  BP:   (!) 101/50 96/67  Pulse: (!) 113 (!) 106 (!) 109 (!) 58  Resp: 19 17 16  (!) 26  Temp:      TempSrc:      SpO2: 100% 99% 100% 97%  Weight:      Height:        Physical Exam Vitals and nursing note reviewed.  Constitutional:      General: She is not in acute distress.    Appearance: She is ill-appearing. She is not toxic-appearing or diaphoretic.  HENT:     Head: Normocephalic and  atraumatic.     Mouth/Throat:     Comments: Tongue is pale Cardiovascular:     Rate and Rhythm: Normal rate and regular rhythm.     Pulses: Normal pulses.  Pulmonary:     Effort: Pulmonary effort is normal. No respiratory distress.     Breath sounds: No wheezing or rales.  Abdominal:     General: Bowel sounds are normal. There is no distension.     Tenderness: There is no abdominal tenderness. There is no guarding or rebound.  Musculoskeletal:     Right lower leg: No edema.     Left  lower leg: No edema.     Comments: Right femoral CVL  Skin:    General: Skin is warm and dry.     Capillary Refill: Capillary refill takes less than 2 seconds.  Neurological:     Mental Status: She is disoriented.     Comments: Answers limited to 1 or 2 words. Mostly No and occ yes. Cannot provide any detailed history.  Appears to follow commands.      Labs on Admission: I have personally reviewed following labs and imaging studies  CBC: Recent Labs  Lab 05/11/22 0004 05/11/22 0010  WBC 20.3*  --   HGB 4.8* 5.4*  HCT 15.7* 16.0*  MCV 96.9  --   PLT 331  --    Basic Metabolic Panel: Recent Labs  Lab 05/11/22 0010  NA 139  137  K 3.2*  3.8  CL 111  108  CO2 20*  GLUCOSE 180*  223*  BUN 51*  50*  CREATININE 1.13*  1.10*  CALCIUM 8.3*   GFR: Estimated Creatinine Clearance: 41.7 mL/min (A) (by C-G formula based on SCr of 1.1 mg/dL (H)). Liver Function Tests: Recent Labs  Lab 05/11/22 0010  AST 18  ALT 13  ALKPHOS 34*  BILITOT <0.1*  PROT 3.7*  ALBUMIN 1.9*   No results for input(s): "LIPASE", "AMYLASE" in the last 168 hours. No results for input(s): "AMMONIA" in the last 168 hours. Coagulation Profile: Recent Labs  Lab 05/11/22 0010  INR 1.5*   Cardiac Enzymes: No results for input(s): "CKTOTAL", "CKMB", "CKMBINDEX", "TROPONINI", "TROPONINIHS" in the last 168 hours. BNP (last 3 results) No results for input(s): "PROBNP" in the last 8760 hours. HbA1C: No results  for input(s): "HGBA1C" in the last 72 hours. CBG: Recent Labs  Lab 05/11/22 0002  GLUCAP 232*   Lipid Profile: No results for input(s): "CHOL", "HDL", "LDLCALC", "TRIG", "CHOLHDL", "LDLDIRECT" in the last 72 hours. Thyroid Function Tests: No results for input(s): "TSH", "T4TOTAL", "FREET4", "T3FREE", "THYROIDAB" in the last 72 hours. Anemia Panel: No results for input(s): "VITAMINB12", "FOLATE", "FERRITIN", "TIBC", "IRON", "RETICCTPCT" in the last 72 hours. Urine analysis:    Component Value Date/Time   COLORURINE YELLOW 05/03/2022 1534   APPEARANCEUR HAZY (A) 05/03/2022 1534   LABSPEC 1.030 05/03/2022 1534   PHURINE 5.0 05/03/2022 1534   GLUCOSEU NEGATIVE 05/03/2022 1534   HGBUR NEGATIVE 05/03/2022 1534   HGBUR negative 10/25/2008 0936   BILIRUBINUR NEGATIVE 05/03/2022 1534   KETONESUR NEGATIVE 05/03/2022 1534   PROTEINUR NEGATIVE 05/03/2022 1534   UROBILINOGEN 0.2 03/23/2018 1531   NITRITE NEGATIVE 05/03/2022 1534   LEUKOCYTESUR LARGE (A) 05/03/2022 1534    Radiological Exams on Admission: I have personally reviewed images DG Chest Port 1 View  Result Date: 05/11/2022 CLINICAL DATA:  Femoral central line EXAM: PORTABLE CHEST 1 VIEW COMPARISON:  12/29/2020 FINDINGS: Lungs are clear.  No pleural effusion or pneumothorax. The heart is normal in size. IMPRESSION: No evidence of acute cardiopulmonary disease. Electronically Signed   By: Charline Bills M.D.   On: 05/11/2022 02:26    EKG: My personal interpretation of EKG shows: sinus tachycardia    Assessment/Plan Principal Problem:   GI bleeding Active Problems:   Acute blood loss anemia   Schizophrenia, paranoid type (HCC)   Hypokalemia   Tobacco abuse    Assessment and Plan: * GI bleeding Admit progressive bed. Continue IV protonix gtts. No prior hx of cirrhosis or esophageal varices. Hold on abx for now. EDP has consulted [facility]. Possible peptic  ulcer disease.  Acute blood loss anemia HgB 8 days ago was  11.9. today HgB 4.8. currently getting PRBC transfusion. Check H&H q6h  Tobacco abuse Chronic. Rx nicotine patch.  Hypokalemia Replete with IV KCl since she is NPO.  Schizophrenia, paranoid type ([place]) Chronic.   DVT prophylaxis: SCDs Code Status: Full Code by default. Pt unable to make medical decision for herself at this point Family Communication: no family at bedside  Disposition Plan: return home  Consults called: EDP has consulted [facility]  Admission status: Inpatient, Telemetry bed   Kristopher Oppenheim, DO [facility] 05/11/2022, 3:08 AM

## 2022-05-11 NOTE — Assessment & Plan Note (Signed)
Chronic. Rx nicotine patch.

## 2022-05-11 NOTE — Assessment & Plan Note (Signed)
HgB 8 days ago was 11.9. today HgB 4.8. currently getting PRBC transfusion. Check H&H q6h

## 2022-05-11 NOTE — Assessment & Plan Note (Signed)
Admit progressive bed. Continue IV protonix gtts. No prior hx of cirrhosis or esophageal varices. Hold on abx for now. EDP has consulted [facility]. Possible peptic ulcer disease.

## 2022-05-11 NOTE — Progress Notes (Signed)
Patient had 3 units of PRBCs transfused today before transferring to 4E. H&H rechecked per Ghimere MD. H&H 9.3 at 1620. Holding off on 4th unit of PRBCs Per Ghimere MD.  Kenard Gower, RN

## 2022-05-11 NOTE — Progress Notes (Signed)
Patient transferred to 4E from Endo. VSS. Telemetry box applied, CCMD notified. Central line in right femoral found with biopatch saturated in bright red blood. CL dressing changed with two RNS present. Patient found with a substantial amount of dirt covering entire body. Complete bath given as well as CHG wipe down administered. Patient oriented to room and staff. Call bell in reach.  Kenard Gower, RN

## 2022-05-11 NOTE — Transfer of Care (Signed)
Immediate Anesthesia Transfer of Care Note  Patient: Danielle [name]  Procedure(s) Performed: ESOPHAGOGASTRODUODENOSCOPY (EGD) WITH PROPOFOL HEMOSTASIS CONTROL HOT HEMOSTASIS (ARGON PLASMA COAGULATION/BICAP) BIOPSY  Patient Location: Endoscopy Unit  Anesthesia Type:General  Level of Consciousness: drowsy and patient cooperative  Airway & Oxygen Therapy: Patient Spontanous Breathing and Patient connected to face mask oxygen  Post-op Assessment: Report given to RN and Post -op Vital signs reviewed and stable  Post vital signs: Reviewed and stable  Last Vitals:  Vitals Value Taken Time  BP 105/62 1218  Temp    Pulse 93   Resp 12   SpO2 100%     Last Pain:  Vitals:   05/11/22 1120  TempSrc: Temporal  PainSc:          Complications: No notable events documented.

## 2022-05-11 NOTE — ED Notes (Signed)
Provider Preston Fleeting at patient's bedside notified of chem8 results and critical hemoglobin of 5.4

## 2022-05-11 NOTE — ED Notes (Signed)
Pt assisted to [facility]. Pt stood with minimal assistance. Pt had large dark bowel movement. Pt linens changed. Pt assisted back onto stretcher. Call bell in reach. Side rails up x2. Pt on continuous monitoring.

## 2022-05-11 NOTE — Anesthesia Procedure Notes (Signed)
Procedure Name: Intubation Date/Time: 05/11/2022 11:35 AM  Performed by: Audie Pinto, CRNAPre-anesthesia Checklist: Patient identified, Emergency Drugs available, Suction available and Patient being monitored Patient Re-evaluated:Patient Re-evaluated prior to induction Oxygen Delivery Method: Circle system utilized Preoxygenation: Pre-oxygenation with 100% oxygen Induction Type: IV induction, Rapid sequence and Cricoid Pressure applied Laryngoscope Size: Glidescope and 3 Grade View: Grade I Tube type: Oral Tube size: 7.0 mm Number of attempts: 1 Airway Equipment and Method: Stylet and Oral airway Placement Confirmation: ETT inserted through vocal cords under direct vision, positive ETCO2 and breath sounds checked- equal and bilateral Secured at: 21 cm Tube secured with: Tape Dental Injury: Teeth and Oropharynx as per pre-operative assessment  Comments: Elective glidescope due to emergent intubation

## 2022-05-11 NOTE — Anesthesia Procedure Notes (Signed)
Procedure Name: MAC Date/Time: 05/11/2022 11:10 AM  Performed by: Janene Harvey, CRNAPre-anesthesia Checklist: Patient identified, Emergency Drugs available, Suction available and Patient being monitored Patient Re-evaluated:Patient Re-evaluated prior to induction Oxygen Delivery Method: Nasal cannula Induction Type: IV induction Placement Confirmation: positive ETCO2 Dental Injury: Teeth and Oropharynx as per pre-operative assessment

## 2022-05-12 ENCOUNTER — Telehealth: Payer: Self-pay

## 2022-05-12 ENCOUNTER — Encounter (HOSPITAL_COMMUNITY): Payer: Self-pay | Admitting: Gastroenterology

## 2022-05-12 DIAGNOSIS — K254 Chronic or unspecified gastric ulcer with hemorrhage: Secondary | ICD-10-CM | POA: Diagnosis not present

## 2022-05-12 DIAGNOSIS — D62 Acute posthemorrhagic anemia: Secondary | ICD-10-CM | POA: Diagnosis not present

## 2022-05-12 LAB — HEMOGLOBIN AND HEMATOCRIT, BLOOD
HCT: 24.1 % — ABNORMAL LOW (ref 36.0–46.0)
HCT: 25.5 % — ABNORMAL LOW (ref 36.0–46.0)
Hemoglobin: 8.3 g/dL — ABNORMAL LOW (ref 12.0–15.0)
Hemoglobin: 8.8 g/dL — ABNORMAL LOW (ref 12.0–15.0)

## 2022-05-12 LAB — HCV RNA QUANT: HCV Quantitative: NOT DETECTED IU/mL (ref >50)

## 2022-05-12 NOTE — Telephone Encounter (Unsigned)
Unable to reach pt: Pt voice mail box is full:  

## 2022-05-12 NOTE — Telephone Encounter (Signed)
-----   Message from Dianah Field, PA-C sent at 05/12/2022  9:14 AM EDT ----- Regarding: ROV Hi.  This patient needs follow-up with Dr. Chales Abrahams in a month or so and will also need an EGD in 3 months to reevaluate large gastric ulcer.  She had a GI bleed and has 1 large and some smaller gastric ulcers.  Also had transfusion for blood loss anemia.  Thank you, S

## 2022-05-12 NOTE — Progress Notes (Signed)
PROGRESS NOTE    Danielle [name]  PYP:[id number] DOB: 1951-06-17 DOA: 05/10/2022 [facility]CP: [facility]    Brief Narrative:  71 year old female with history of paranoid schizophrenia, coronary artery disease  chronic debility from assisted living facility brought to the emergency room with following finding the patient with dried blood all over.  On arrival blood pressure 77.  Hemoglobin was 4.8.  Resuscitated and admitted to the hospital.   Assessment & Plan:   Acute blood loss anemia Anemia due to upper GI bleeding, bleeding gastric ulcer Hemoglobin 4.8-3 units of PRBC-more than 8 and sustaining well.  Continue to check every 12 hours.  Transfuse for less than 8. Status post EGD, found to have large gastric ulcer with visible blood vessel.  Stabilizing.  Remains on Protonix infusion for 72 hours. Clear liquid diet-advancing to full liquid diet today.  Paranoid schizophrenia: Chronic.  Resume her Zoloft, Seroquel and Ingrezza.  Acute kidney injury: Due to above.  Stabilized and normalized.    DVT prophylaxis: SCDs Start: 05/11/22 1323   Code Status: Full code Family Communication: None Disposition Plan: Status is: Inpatient Remains inpatient appropriate because: Protonix infusion     Consultants:  Gastroenterology  Procedures:  Upper GI endoscopy 7/25  Antimicrobials:  None   Subjective: Patient seen and examined.  Denies any complaints.  She had grossly melanotic bowel movement.  Does not have good appetite.  Was asking for food so she can try some.  Objective: Vitals:   05/11/22 1314 05/11/22 2006 05/11/22 2328 05/12/22 0755  BP: 122/67 (!) 99/48 (!) 111/56 (!) 104/57  Pulse: 94 93 91 86  Resp: 20 20 19 18   Temp: 99.3 F (37.4 C) 99 F (37.2 C) 98.7 F (37.1 C) 99.2 F (37.3 C)  TempSrc: Oral Oral Oral Oral  SpO2: 100% 95% 99% 96%  Weight:      Height:        Intake/Output Summary (Last 24 hours) at 05/12/2022 1203 Last data filed at  05/11/2022 2000 Gross per 24 hour  Intake 1298.64 ml  Output --  Net 1298.64 ml   Filed Weights   05/11/22 0201  Weight: 62 kg    Examination:  General exam: Appears calm and comfortable  Debilitated.  Chronically sick looking.  Not in distress.  On room air. Respiratory system: Clear to auscultation. Respiratory effort normal. Cardiovascular system: S1 & S2 heard, RRR. No pedal edema. Gastrointestinal system: Soft nontender.  Bowel sound present. Central nervous system: Alert and oriented. No focal neurological deficits. Extremities: Symmetric 5 x 5 power. Skin: No rashes, lesions or ulcers Psychiatry: Flat affect.    Data Reviewed: I have personally reviewed following labs and imaging studies  CBC: Recent Labs  Lab 05/11/22 0004 05/11/22 0010 05/11/22 1050 05/11/22 1620 05/12/22 0545  WBC 20.3*  --   --   --   --   HGB 4.8* 5.4* 9.5* 9.3* 8.3*  HCT 15.7* 16.0* 28.0* 26.9* 24.1*  MCV 96.9  --   --   --   --   PLT 331  --   --   --   --    Basic Metabolic Panel: Recent Labs  Lab 05/11/22 0010 05/11/22 1050  NA 139  137 141  K 3.2*  3.8 4.1  CL 111  108 110  CO2 20*  --   GLUCOSE 180*  223* 120*  BUN 51*  50* 37*  CREATININE 1.13*  1.10* 0.80  CALCIUM 8.3*  --    GFR: Estimated  Creatinine Clearance: 57.3 mL/min (by C-G formula based on SCr of 0.8 mg/dL). Liver Function Tests: Recent Labs  Lab 05/11/22 0010  AST 18  ALT 13  ALKPHOS 34*  BILITOT <0.1*  PROT 3.7*  ALBUMIN 1.9*   No results for input(s): "LIPASE", "AMYLASE" in the last 168 hours. No results for input(s): "AMMONIA" in the last 168 hours. Coagulation Profile: Recent Labs  Lab 05/11/22 0010  INR 1.5*   Cardiac Enzymes: No results for input(s): "CKTOTAL", "CKMB", "CKMBINDEX", "TROPONINI" in the last 168 hours. BNP (last 3 results) No results for input(s): "PROBNP" in the last 8760 hours. HbA1C: No results for input(s): "HGBA1C" in the last 72 hours. CBG: Recent Labs  Lab  05/11/22 0002  GLUCAP 232*   Lipid Profile: No results for input(s): "CHOL", "HDL", "LDLCALC", "TRIG", "CHOLHDL", "LDLDIRECT" in the last 72 hours. Thyroid Function Tests: No results for input(s): "TSH", "T4TOTAL", "FREET4", "T3FREE", "THYROIDAB" in the last 72 hours. Anemia Panel: No results for input(s): "VITAMINB12", "FOLATE", "FERRITIN", "TIBC", "IRON", "RETICCTPCT" in the last 72 hours. Sepsis Labs: Recent Labs  Lab 05/11/22 0010 05/11/22 0259  LATICACIDVEN 4.9* 3.2*    Recent Results (from the past 240 hour(s))  Urine Culture     Status: Abnormal   Collection Time: 05/03/22  4:52 PM   Specimen: Urine, Clean Catch  Result Value Ref Range Status   Specimen Description   Final    URINE, CLEAN CATCH Performed at [facility], [address], [place], [place] [address]    Special Requests   Final    NONE Performed at [facility], [address] [place], [place] [address]    Culture (A)  Final    20,000 COLONIES/mL LACTOBACILLUS SPECIES Standardized susceptibility testing for this organism is not available. Performed at [facility], [address], [place], [place] [address]    Report Status 05/05/2022 FINAL  Final         Radiology Studies: DG Chest Port 1 View  Result Date: 05/11/2022 CLINICAL DATA:  Femoral central line EXAM: PORTABLE CHEST 1 VIEW COMPARISON:  12/29/2020 FINDINGS: Lungs are clear.  No pleural effusion or pneumothorax. The heart is normal in size. IMPRESSION: No evidence of acute cardiopulmonary disease. Electronically Signed   By: Charline Bills M.D.   On: 05/11/2022 02:26        Scheduled Meds:  sodium chloride   Intravenous Once   Chlorhexidine Gluconate Cloth  6 each Topical Q0600   nicotine  21 mg Transdermal Daily   pregabalin  150 mg Oral BID   QUEtiapine  100 mg Oral Daily   sertraline  100 mg Oral QHS   sucralfate  1 g Oral Q6H   valbenazine  40 mg Oral Daily   Continuous  Infusions:  sodium chloride     sodium chloride Stopped (05/11/22 0200)   pantoprazole 8 mg/hr (05/12/22 1139)     LOS: 1 day    Time spent: 35 minutes    Dorcas Carrow, MD [facility] Pager [phone]

## 2022-05-12 NOTE — Evaluation (Signed)
Physical Therapy Evaluation Patient Details Name: Danielle [name] MRN: [id number] DOB: [birth year] Today's Date: 05/12/2022  History of Present Illness  Pt is a 71 y.o. female admitted from ALF on 05/10/22 after fall; found to be hypotensive, anemic. Upper endoscopy showed large bleeding gastric ulcer. PMH includes polysubstance abuse, depression, memory disturbance, bipolar affective disorder, schizophrenia, syncope.   Clinical Impression  Pt presents with an overall decrease in functional mobility secondary to above. PTA, pt resident at ALF, reports independent with mobility and ADLs, meals and transportation provided; pt unable to recall how many falls she has had in past 2 months. Today, pt moving well at supervision-level for safety lines; pt with flat affect, endorses dizziness with mobility; BP 109/63, HR 87. Pt would benefit from continued acute PT services to maximize functional mobility and independence prior to return to ALF.        Recommendations for follow up therapy are one component of a multi-disciplinary discharge planning process, led by the attending physician.  Recommendations may be updated based on patient status, additional functional criteria and insurance authorization.  Follow Up Recommendations No PT follow up (return to ALF)      Assistance Recommended at Discharge Intermittent Supervision/Assistance  Patient can return home with the following  Assistance with cooking/housework;Assist for transportation;Help with stairs or ramp for entrance;Direct supervision/assist for financial management;Direct supervision/assist for medications management    Equipment Recommendations None recommended by PT  Recommendations for Other Services       Functional Status Assessment Patient has had a recent decline in their functional status and demonstrates the ability to make significant improvements in function in a reasonable and predictable amount of time.     Precautions /  Restrictions Precautions Precautions: Fall;Other (comment) Precaution Comments: h/o falls Restrictions Weight Bearing Restrictions: No      Mobility  Bed Mobility Overal bed mobility: Independent                  Transfers Overall transfer level: Needs assistance Equipment used: None Transfers: Sit to/from Stand Sit to Stand: Supervision           General transfer comment: supervision for safety/lines; able to stand from EOB and low toilet height without assist    Ambulation/Gait Ambulation/Gait assistance: Supervision Gait Distance (Feet): 40 Feet Assistive device: None Gait Pattern/deviations: Step-through pattern, Decreased stride length, Trunk flexed Gait velocity: Decreased     General Gait Details: slow, steady but guarded gait without DME, supervision for safety/lines as pt with poor awareness of IV pole; pt endorses dizziness, BP109/63  Stairs            Wheelchair Mobility    Modified Rankin (Stroke Patients Only)       Balance Overall balance assessment: Needs assistance Sitting-balance support: No upper extremity supported, Feet supported Sitting balance-Leahy Scale: Good Sitting balance - Comments: indep to don socks, perform pericare on toilet   Standing balance support: During functional activity, No upper extremity supported Standing balance-Leahy Scale: Good Standing balance comment: ambulatory and performing ADL tasks at sink without UE support                             Pertinent Vitals/Pain Pain Assessment Pain Assessment: No/denies pain    Home Living Family/patient expects to be discharged to:: Assisted living                 Home Equipment: None Additional Comments: pt reports resident at ALF;  has roommate; does not have to manage stairs    Prior Function Prior Level of Function : Independent/Modified Independent             Mobility Comments: Independent without DME; pt unable to recall how  many falls she has had in the past two months besides one leading to admission; meals provided by facility, pt walks to dining hall ADLs Comments: Pt reports indep with ADLs     Hand Dominance        Extremity/Trunk Assessment   Upper Extremity Assessment Upper Extremity Assessment: Generalized weakness (limited R shoulder AROM? not formally assessed)    Lower Extremity Assessment Lower Extremity Assessment: Generalized weakness       Communication   Communication: No difficulties  Cognition Arousal/Alertness: Awake/alert Behavior During Therapy: Flat affect Overall Cognitive Status: History of cognitive impairments - at baseline                                 General Comments: pt answering majority of questions and following simple commands appropriately; flat affect and not forthcoming with info; poor awareness of IV pole with mobility        General Comments General comments (skin integrity, edema, etc.): pt endorses dizziness with mobility, reports this happens often, post-ambulation BP 109/64, HR 80s    Exercises     Assessment/Plan    PT Assessment Patient needs continued PT services  PT Problem List Decreased strength;Decreased activity tolerance;Decreased balance;Decreased mobility;Decreased cognition       PT Treatment Interventions Gait training;Functional mobility training;Therapeutic activities;Therapeutic exercise;Balance training;Patient/family education    PT Goals (Current goals can be found in the Care Plan section)  Acute Rehab PT Goals Patient Stated Goal: feel better PT Goal Formulation: With patient Time For Goal Achievement: 05/26/22 Potential to Achieve Goals: Good    Frequency Min 3X/week     Co-evaluation               AM-PAC PT "6 Clicks" Mobility  Outcome Measure Help needed turning from your back to your side while in a flat bed without using bedrails?: None Help needed moving from lying on your back to  sitting on the side of a flat bed without using bedrails?: None Help needed moving to and from a bed to a chair (including a wheelchair)?: A Little Help needed standing up from a chair using your arms (e.g., wheelchair or bedside chair)?: A Little Help needed to walk in hospital room?: A Little Help needed climbing 3-5 steps with a railing? : A Little 6 Click Score: 20    End of Session   Activity Tolerance: Patient tolerated treatment well;Patient limited by fatigue Patient left: in chair;with call bell/phone within reach;with nursing/sitter in room;Other (comment) (OT to bring chair alarm box) Nurse Communication: Mobility status PT Visit Diagnosis: Other abnormalities of gait and mobility (R26.89)    Time: 1601-0932 PT Time Calculation (min) (ACUTE ONLY): 17 min   Charges:   PT Evaluation $PT Eval Moderate Complexity: 1 Mod        Ina Homes, PT, DPT Acute Rehabilitation Services  Personal: Secure Chat Rehab Office: [phone]  Malachy Chamber 05/12/2022, 10:18 AM

## 2022-05-12 NOTE — Evaluation (Signed)
Occupational Therapy Evaluation Patient Details Name: Danielle [name] MRN: [id number] DOB: 18-May-1951 Today's Date: 05/12/2022   History of Present Illness Pt is a 71 y.o. female admitted from ALF on 05/10/22 after fall; found to be hypotensive, anemic. Upper endoscopy showed large bleeding gastric ulcer. PMH includes polysubstance abuse, depression, memory disturbance, bipolar affective disorder, schizophrenia, syncope.   Clinical Impression   Pt was evaluated s/p the above admission list, she is generally mod I at her ALF. Upon evaluation pt demonstrated impaired cognition, generalized weakness, and poor activity tolerance. Overall she required min G for transfers, OOB mobility with RW and grooming at the sink. She requires up to min A for ADLs, and benefits from cues for safety during functional tasks. OT to follow actely. Recommend d/c to ALF with HHOT to safety progress and transition to her home setting.      Recommendations for follow up therapy are one component of a multi-disciplinary discharge planning process, led by the attending physician.  Recommendations may be updated based on patient status, additional functional criteria and insurance authorization.   Follow Up Recommendations  Home health OT (at ALF)    Assistance Recommended at Discharge Frequent or constant Supervision/Assistance  Patient can return home with the following A little help with walking and/or transfers;A little help with bathing/dressing/bathroom;Assistance with cooking/housework;Direct supervision/assist for medications management;Direct supervision/assist for financial management;Assist for transportation;Help with stairs or ramp for entrance    Functional Status Assessment  Patient has had a recent decline in their functional status and demonstrates the ability to make significant improvements in function in a reasonable and predictable amount of time.  Equipment Recommendations  BSC/3in1     Recommendations for Other Services       Precautions / Restrictions Precautions Precautions: Fall;Other (comment) Precaution Comments: h/o falls Restrictions Weight Bearing Restrictions: No      Mobility Bed Mobility               General bed mobility comments: OOB in chiar upon arrival    Transfers Overall transfer level: Needs assistance Equipment used: Rolling walker (2 wheels) Transfers: Sit to/from Stand Sit to Stand: Min guard                  Balance Overall balance assessment: Needs assistance Sitting-balance support: No upper extremity supported, Feet supported Sitting balance-Leahy Scale: Good     Standing balance support: Single extremity supported, During functional activity Standing balance-Leahy Scale: Fair Standing balance comment: maintained at least 1UE supported while grooming at the sink                           ADL either performed or assessed with clinical judgement   ADL Overall ADL's : Needs assistance/impaired Eating/Feeding: Independent;Sitting   Grooming: Min guard;Standing   Upper Body Bathing: Supervision/ safety;Set up;Sitting   Lower Body Bathing: Minimal assistance;Sit to/from stand   Upper Body Dressing : Set up;Sitting   Lower Body Dressing: Minimal assistance;Sit to/from stand   Toilet Transfer: Min guard;Ambulation;Rolling walker (2 wheels) Toilet Transfer Details (indicate cue type and reason): poor management of RW Toileting- Clothing Manipulation and Hygiene: Supervision/safety;Sitting/lateral lean       Functional mobility during ADLs: Min guard;Rolling walker (2 wheels) General ADL Comments: poor safety awareness, min G throughout for safety     Vision Baseline Vision/History: 0 No visual deficits Vision Assessment?: No apparent visual deficits Additional Comments: no tformally tested but [facility] for all funcitonal tasks assessed  Pertinent Vitals/Pain Pain Assessment Pain  Assessment: No/denies pain     Hand Dominance Right   Extremity/Trunk Assessment Upper Extremity Assessment Upper Extremity Assessment: Generalized weakness   Lower Extremity Assessment Lower Extremity Assessment: Generalized weakness   Cervical / Trunk Assessment Cervical / Trunk Assessment: Kyphotic   Communication Communication Communication: No difficulties   Cognition Arousal/Alertness: Awake/alert Behavior During Therapy: Flat affect Overall Cognitive Status: History of cognitive impairments - at baseline                                 General Comments: pt answering majority of questions and following simple commands appropriately; flat affect and not forthcoming with info; poor awareness of IV pole and RW with mobility. Answers most questions with one word answers and increased time. Pt reporting inconsistent information, denied DME to PT, reports RW use to OT.     General Comments  VSS on RA, became a bit dizzy at the sink. BP stable.            Home Living Family/patient expects to be discharged to:: Assisted living               Home Equipment: None   Additional Comments: pt reports resident at ALF; has roommate; does not have to manage stairs      Prior Functioning/Environment Prior Level of Function : Independent/Modified Independent             Mobility Comments: Independent without DME; pt unable to recall how many falls she has had in the past two months besides one leading to admission; meals provided by facility, pt walks to dining hall ADLs Comments: Pt reports indep with ADLs        OT Problem List: Decreased range of motion;Decreased strength;Decreased activity tolerance;Impaired balance (sitting and/or standing);Decreased cognition;Decreased safety awareness;Decreased knowledge of use of DME or AE      OT Treatment/Interventions: Self-care/ADL training;Therapeutic exercise;DME and/or AE instruction;Therapeutic  activities;Patient/family education;Balance training    OT Goals(Current goals can be found in the care plan section) Acute Rehab OT Goals Patient Stated Goal: to feel better OT Goal Formulation: With patient Time For Goal Achievement: 05/26/22 Potential to Achieve Goals: Good ADL Goals Pt Will Perform Grooming: standing;with modified independence Pt Will Perform Lower Body Dressing: with modified independence;sit to/from stand Pt Will Transfer to Toilet: with modified independence;ambulating;regular height toilet Additional ADL Goal #1: Pt will demonstrate increased activity tolerance to complete at least 5 ADLs OOB without sitting rest break given supervision A  OT Frequency: Min 2X/week       AM-PAC OT "6 Clicks" Daily Activity     Outcome Measure Help from another person eating meals?: None Help from another person taking care of personal grooming?: A Little Help from another person toileting, which includes using toliet, bedpan, or urinal?: A Little Help from another person bathing (including washing, rinsing, drying)?: A Little Help from another person to put on and taking off regular upper body clothing?: None Help from another person to put on and taking off regular lower body clothing?: A Little 6 Click Score: 20   End of Session Equipment Utilized During Treatment: Gait belt;Rolling walker (2 wheels) Nurse Communication: Mobility status  Activity Tolerance: Patient tolerated treatment well Patient left: in chair;with call bell/phone within reach;with chair alarm set  OT Visit Diagnosis: Unsteadiness on feet (R26.81);Other abnormalities of gait and mobility (R26.89);Muscle weakness (generalized) (M62.81)  Time: 6283-6629 OT Time Calculation (min): 17 min Charges:  OT General Charges $OT Visit: 1 Visit OT Evaluation $OT Eval Moderate Complexity: 1 Mod   Tanish Sinkler A Karlynn Furrow 05/12/2022, 10:33 AM

## 2022-05-12 NOTE — TOC Initial Note (Signed)
Transition of Care [facility]) - Initial/Assessment Note    Patient Details  Name: Danielle [name] MRN: [id number] Date of Birth: 1951/08/10  [facility] of [facility]) CM/SW Contact:    Eduard Roux, LCSW Phone Number: 05/12/2022, 4:40 PM  Clinical Narrative:                  CSW received secure chat message from RN Kristi- patient's daughter,Veronica, requested a call from CSW. CSW spoke with patient's daughter, she expressed she wanted information on other ALFs. CSW advised will leave information in the room with the patient.   Antony Blackbird, MSW, LCSW Clinical Social Worker     Barriers to Discharge: Continued Medical Work up   Patient Goals and CMS Choice        Expected Discharge Plan and Services   In-house Referral: Clinical Social Work     Living arrangements for the past 2 months: Assisted Living Facility                                      Prior Living Arrangements/Services Living arrangements for the past 2 months: Assisted Living Facility Lives with:: Facility Resident Patient language and need for interpreter reviewed:: No        Need for Family Participation in Patient Care: Yes (Comment) Care giver support system in place?: Yes (comment)   Criminal Activity/Legal Involvement Pertinent to Current Situation/Hospitalization: No - Comment as needed  Activities of Daily Living   ADL Screening (condition at time of admission) Is the patient deaf or have difficulty hearing?: No Does the patient have difficulty seeing, even when wearing glasses/contacts?: No Does the patient have difficulty concentrating, remembering, or making decisions?: Yes Does the patient have difficulty dressing or bathing?: Yes Does the patient have difficulty walking or climbing stairs?: Yes  Permission Sought/Granted                  Emotional Assessment       Orientation: : Oriented to Self, Oriented to Place, Oriented to  Time, Oriented to  Situation Alcohol / Substance Use: Not Applicable Psych Involvement: No (comment)  Admission diagnosis:  Upper gastrointestinal bleeding [K92.2] Hypokalemia [E87.6] GI bleeding [K92.2] Hypoalbuminemia [E88.09] Anemia due to acute blood loss [D62] Elevated BUN [R79.9] Elevated lactic acid level [R79.89] Hypotension due to hypovolemia [I95.89, E86.1] Patient Active Problem List   Diagnosis Date Noted   GI bleeding 05/11/2022   Acute blood loss anemia 05/11/2022   Major depressive disorder, recurrent episode, moderate (HCC)    Dysarthria 12/29/2020   AKI (acute kidney injury) (HCC) 12/29/2020   CAD (coronary artery disease) 03/22/2019   Unstable angina (HCC)    Hypokalemia 08/11/2018   Tobacco abuse 08/11/2018   Memory disturbance 01/30/2016   Schizophrenia, paranoid type (HCC) 04/28/2012   Cocaine abuse (HCC) 04/28/2012   Cannabis abuse 04/28/2012   Substance induced mood disorder (HCC) 04/28/2012   Noncompliance 09/16/2011   Anxiety 09/16/2011   Constipation 09/16/2011   SUBSTANCE ABUSE, MULTIPLE 12/03/2008   HYPERLIPIDEMIA 08/01/2008   MIGRAINE HEADACHE 08/01/2008   ASTHMA 08/01/2008   DEPRESSION 06/20/2008   ANXIETY DISORDER, GENERALIZED 05/28/2008   HYPERTENSION, BENIGN ESSENTIAL 05/28/2008   GERD 05/28/2008   DEEP VENOUS THROMBOPHLEBITIS, LEFT, LEG, HX OF 10/18/1988   PCP:  [facility]:   [facility] [address] [place] [address] Phone: [phone] Fax: [phone]  Social Determinants of Health (SDOH) Interventions    Readmission Risk Interventions     No data to display           

## 2022-05-12 NOTE — Consult Note (Addendum)
Daily Rounding Note  05/12/2022, 9:05 AM  LOS: 1 day   SUBJECTIVE:   Chief complaint:    Large GU, smaller GU's.  Patient had a black stool last night and a small black stool this morning.  She still has upper abdominal discomfort and nausea but both of these are improved.  Has not vomited.  Tolerating clear liquids.  OBJECTIVE:         Vital signs in last 24 hours:    Temp:  [98.2 F (36.8 C)-99.6 F (37.6 C)] 99.2 F (37.3 C) (07/26 0755) Pulse Rate:  [86-103] 86 (07/26 0755) Resp:  [18-28] 18 (07/26 0755) BP: (85-129)/(48-75) 104/57 (07/26 0755) SpO2:  [95 %-100 %] 96 % (07/26 0755)   Filed Weights   05/11/22 0201  Weight: 62 kg   General: NAD.  Psychomotor slowing as was evident yesterday. Heart: RRR Chest: Clear bilaterally Abdomen: Soft without tenderness.  Nondistended.  Active bowel sounds. Extremities: CCE Neuro/Psych: Cooperative, oriented x3.  Psychomotor slowing with some slow lipsmacking movements.  Some difficulty forming her words but not aphasic.  Calm  Intake/Output from previous day: 07/25 0701 - 07/26 0700 In: 2313.6 [P.O.:600; I.V.:1048.6; Blood:665] Out: 1700 [Urine:1500; Stool:200]  Intake/Output this shift: No intake/output data recorded.  Lab Results: Recent Labs    05/11/22 0004 05/11/22 0010 05/11/22 1050 05/11/22 1620 05/12/22 0545  WBC 20.3*  --   --   --   --   HGB 4.8*   < > 9.5* 9.3* 8.3*  HCT 15.7*   < > 28.0* 26.9* 24.1*  PLT 331  --   --   --   --    < > = values in this interval not displayed.   BMET Recent Labs    05/11/22 0010 05/11/22 1050  NA 139  137 141  K 3.2*  3.8 4.1  CL 111  108 110  CO2 20*  --   GLUCOSE 180*  223* 120*  BUN 51*  50* 37*  CREATININE 1.13*  1.10* 0.80  CALCIUM 8.3*  --    LFT Recent Labs    05/11/22 0010  PROT 3.7*  ALBUMIN 1.9*  AST 18  ALT 13  ALKPHOS 34*  BILITOT <0.1*   PT/INR Recent Labs     05/11/22 0010  LABPROT 17.7*  INR 1.5*   Hepatitis Panel No results for input(s): "HEPBSAG", "HCVAB", "HEPAIGM", "HEPBIGM" in the last 72 hours.  Studies/Results: DG Chest Port 1 View  Result Date: 05/11/2022 CLINICAL DATA:  Femoral central line EXAM: PORTABLE CHEST 1 VIEW COMPARISON:  12/29/2020 FINDINGS: Lungs are clear.  No pleural effusion or pneumothorax. The heart is normal in size. IMPRESSION: No evidence of acute cardiopulmonary disease. Electronically Signed   By: Charline Bills M.D.   On: 05/11/2022 02:26    Scheduled Meds:  sodium chloride   Intravenous Once   Chlorhexidine Gluconate Cloth  6 each Topical Q0600   nicotine  21 mg Transdermal Daily   pregabalin  150 mg Oral BID   QUEtiapine  100 mg Oral Daily   sertraline  100 mg Oral QHS   sucralfate  1 g Oral Q6H   valbenazine  40 mg Oral Daily   Continuous Infusions:  sodium chloride     sodium chloride Stopped (05/11/22 0200)   pantoprazole 8 mg/hr (05/12/22 0117)   PRN Meds:.acetaminophen, albuterol, hydrOXYzine, ondansetron (ZOFRAN) IV    ASSESMENT:     UGIB.  7/25 EGD: Large,  fibrotic gastric ulcer with VV, treated with epi and bipolar probe cauterization.  Other smaller gastric ulcers without stigmata of bleeding.  Biopsies for HP obtained.  Otherwise normal study.  Day 2 Sucralfate qid (for 2 weeks), day 2 PPI gtt. nausea, abdominal pain improved.  No vomiting.    ABL anemia.  S/p 3 PRBCs.  Hgb 4.8 .. 8.3.     Hx HCV Ab positive.   HCV quant in progress liver unremarkable on CT of 7/17.   PLAN     PPI gtt finishes 7/28 at 0115.  Follow w Protonix 40 mg po bid for 6 weeks, then 40 mg daily thereafter.     Avoid all ASA, NSAIDs.    Leave clear liquids in place for now.  Dr. Reece Agar will define when we can advance to solid food  IR referral for potential embolization if recurrent bleeding.  EGD in 3 months to confirm ulcer healing.  Dr Urban Gibson office will arrange fup.      CBC 7 to 10 d after discharge  to assure stable/improving.  Can get this through PCP at [facility] or through phlebotomy at her assisted living facility.      GI signing off.     Addendum 11:10 AM.  Spoke with Dr. Chales Abrahams.  Okay to advance diet to full liquids today, can resume solids tomorrow if no interim evidence of recurrent bleeding.      [name]  05/12/2022, 9:05 AM Phone [phone]     Attending physician's note   I have taken history, reviewed the chart and examined the patient. I performed a substantive portion of this encounter, including complete performance of at least one of the key components, in conjunction with the APP. I agree with the Advanced Practitioner's note, impression and recommendations.   UGI bleed d/t large GU w/t visible vessel s/p endoscopic Rx. Hb stable  Plan: -IV protonix to PO protonix 40mg  BID 7/28 -Carafate 1g PO QID x 2 weeks -Advance to full liq diet. Soft tomorrow, then regular -No NSAIDs -Follow bx for HP -If any recurrent bleeding, IR embolization.  -Rpt EGD in 12 weeks  to ensure healing -Follow HCV as outpt -Will stand by. Pl call if any ? Or change in status.   8/28, MD [facility] [phone]

## 2022-05-12 NOTE — Progress Notes (Signed)
Patients daughter is concerned about the living facility she came from [facility]" and is interested in speaking to someone about finding somewhere else for patient to discharge home to. Daughter stated if she has to at discharge she can take her home with her as long as she does not go back to [facility]. Theodoro Doing, daughter, [phone].  Kenard Gower, RN

## 2022-05-12 NOTE — Progress Notes (Signed)
Patients daughter is concerned with a dried spot of blood on top of her head. Notified MD.  Kenard Gower, RN

## 2022-05-13 ENCOUNTER — Inpatient Hospital Stay (HOSPITAL_COMMUNITY): Payer: Medicare Other

## 2022-05-13 ENCOUNTER — Telehealth: Payer: Self-pay

## 2022-05-13 DIAGNOSIS — D62 Acute posthemorrhagic anemia: Secondary | ICD-10-CM | POA: Diagnosis not present

## 2022-05-13 DIAGNOSIS — K254 Chronic or unspecified gastric ulcer with hemorrhage: Secondary | ICD-10-CM | POA: Diagnosis not present

## 2022-05-13 LAB — HEMOGLOBIN AND HEMATOCRIT, BLOOD
HCT: 27.2 % — ABNORMAL LOW (ref 36.0–46.0)
HCT: 27.5 % — ABNORMAL LOW (ref 36.0–46.0)
Hemoglobin: 9.1 g/dL — ABNORMAL LOW (ref 12.0–15.0)
Hemoglobin: 9.6 g/dL — ABNORMAL LOW (ref 12.0–15.0)

## 2022-05-13 LAB — SURGICAL PATHOLOGY

## 2022-05-13 MED ORDER — PREGABALIN 75 MG PO CAPS
150.0000 mg | ORAL_CAPSULE | Freq: Two times a day (BID) | ORAL | Status: AC
  Administered 2022-05-13: 150 mg via ORAL
  Filled 2022-05-13: qty 2

## 2022-05-13 MED ORDER — PREGABALIN 75 MG PO CAPS
75.0000 mg | ORAL_CAPSULE | Freq: Three times a day (TID) | ORAL | Status: DC
  Administered 2022-05-14 (×2): 75 mg via ORAL
  Filled 2022-05-13 (×2): qty 1

## 2022-05-13 MED ORDER — QUETIAPINE FUMARATE 100 MG PO TABS: 200.0000 mg | ORAL_TABLET | Freq: Every day | ORAL | Status: DC

## 2022-05-13 MED ORDER — QUETIAPINE FUMARATE 100 MG PO TABS
100.0000 mg | ORAL_TABLET | Freq: Once | ORAL | Status: AC
Start: 2022-05-13 — End: 2022-05-13
  Administered 2022-05-13: 100 mg via ORAL
  Filled 2022-05-13: qty 1

## 2022-05-13 MED ORDER — VALBENAZINE TOSYLATE 40 MG PO CAPS
80.0000 mg | ORAL_CAPSULE | Freq: Every day | ORAL | Status: DC
Start: 2022-05-14 — End: 2022-05-14
  Administered 2022-05-14: 80 mg via ORAL
  Filled 2022-05-13: qty 2

## 2022-05-13 NOTE — Telephone Encounter (Signed)
Spoke with Pt Daughter Suzette Battiest: Suzette Battiest stated that pt is still admitted to the Hospital. Suzette Battiest was notified that we would like to schedule the pt for an office visit and an EGD. Suzette Battiest stated that they will call and schedule once pt is discharged from the hospital:  [name]  verbalized understanding with all questions answered.  Reminder placed in Epic for 1 week to follow up

## 2022-05-13 NOTE — Progress Notes (Signed)
Occupational Therapy Treatment Patient Details Name: Danielle [name] MRN: [id number] DOB: 05-Jan-1951 Today's Date: 05/13/2022   History of present illness Pt is a 71 y.o. female admitted from ALF on 05/10/22 after fall; found to be hypotensive, anemic. Upper endoscopy showed large bleeding gastric ulcer. PMH includes polysubstance abuse, depression, memory disturbance, bipolar affective disorder, schizophrenia, syncope.   OT comments  Pt limited by fatigue/lethargy this session. Agreeable only to sit up EOB and stand. Pt refused further mobility in room and all ADL's, except for washing her face. Continues to requiring min guard for safety and benefits from RW. Continuing to recommend HHOT to maximize independence and safety. OT will follow acutely.    Recommendations for follow up therapy are one component of a multi-disciplinary discharge planning process, led by the attending physician.  Recommendations may be updated based on patient status, additional functional criteria and insurance authorization.    Follow Up Recommendations  Home health OT    Assistance Recommended at Discharge Frequent or constant Supervision/Assistance  Patient can return home with the following  A little help with walking and/or transfers;A little help with bathing/dressing/bathroom;Assistance with cooking/housework;Direct supervision/assist for medications management;Direct supervision/assist for financial management;Assist for transportation;Help with stairs or ramp for entrance   Equipment Recommendations  BSC/3in1    Recommendations for Other Services      Precautions / Restrictions Precautions Precautions: Fall;Other (comment) Precaution Comments: h/o falls Restrictions Weight Bearing Restrictions: No       Mobility Bed Mobility Overal bed mobility: Modified Independent             General bed mobility comments: increased time and heavy use of bedrail    Transfers Overall transfer  level: Needs assistance Equipment used: None Transfers: Sit to/from Stand Sit to Stand: Min guard           General transfer comment: Min guard for safety, increase time to steady     Balance Overall balance assessment: Needs assistance Sitting-balance support: No upper extremity supported, Feet supported Sitting balance-Leahy Scale: Good Sitting balance - Comments: indep to don socks, perform pericare on toilet   Standing balance support: Single extremity supported, During functional activity Standing balance-Leahy Scale: Fair Standing balance comment: unable to tolerate challenge                           ADL either performed or assessed with clinical judgement   ADL Overall ADL's : Needs assistance/impaired     Grooming: Wash/dry face;Independent;Sitting Grooming Details (indicate cue type and reason): completed EOB                 Toilet Transfer: Min guard;Rolling walker (2 wheels) Toilet Transfer Details (indicate cue type and reason): simulated in room           General ADL Comments: Pt refusing most mobility this session, limited by balance and fatigue    Extremity/Trunk Assessment              Vision       Perception     Praxis      Cognition Arousal/Alertness: Lethargic Behavior During Therapy: Flat affect Overall Cognitive Status: History of cognitive impairments - at baseline                                 General Comments: Poor safety awareness        Exercises Exercises: Other exercises Other  Exercises Other Exercises: Encouraged cervical stretches    Shoulder Instructions       General Comments VSS on RA    Pertinent Vitals/ Pain       Pain Assessment Pain Assessment: No/denies pain  Home Living                                          Prior Functioning/Environment              Frequency  Min 2X/week        Progress Toward Goals  OT Goals(current goals can  now be found in the care plan section)  Progress towards OT goals: Progressing toward goals  Acute Rehab OT Goals Patient Stated Goal: To get some sleep OT Goal Formulation: With patient Time For Goal Achievement: 05/26/22 Potential to Achieve Goals: Good ADL Goals Pt Will Perform Grooming: standing;with modified independence Pt Will Perform Lower Body Dressing: with modified independence;sit to/from stand Pt Will Transfer to Toilet: with modified independence;ambulating;regular height toilet Additional ADL Goal #1: Pt will demonstrate increased activity tolerance to complete at least 5 ADLs OOB without sitting rest break given supervision A  Plan Discharge plan remains appropriate;Frequency remains appropriate    Co-evaluation                 AM-PAC OT "6 Clicks" Daily Activity     Outcome Measure   Help from another person eating meals?: None Help from another person taking care of personal grooming?: A Little Help from another person toileting, which includes using toliet, bedpan, or urinal?: A Little Help from another person bathing (including washing, rinsing, drying)?: A Little Help from another person to put on and taking off regular upper body clothing?: None Help from another person to put on and taking off regular lower body clothing?: A Little 6 Click Score: 20    End of Session Equipment Utilized During Treatment: Gait belt;Rolling walker (2 wheels)  OT Visit Diagnosis: Unsteadiness on feet (R26.81);Other abnormalities of gait and mobility (R26.89);Muscle weakness (generalized) (M62.81)   Activity Tolerance Patient tolerated treatment well   Patient Left in bed;with call bell/phone within reach;with bed alarm set   Nurse Communication Mobility status        Time: 6195-0932 OT Time Calculation (min): 12 min  Charges: OT General Charges $OT Visit: 1 Visit OT Treatments $Self Care/Home Management : 8-22 mins  Danielle Mould H., OTR/L Acute  Rehabilitation  Camdan Burdi Elane Bing Plume 05/13/2022, 5:36 PM

## 2022-05-13 NOTE — Progress Notes (Signed)
Physical Therapy Treatment Patient Details Name: Danielle [name] MRN: [id number] DOB: 04/26/1951 Today's Date: 05/13/2022   History of Present Illness Pt is a 71 y.o. female admitted from ALF on 05/10/22 after fall; found to be hypotensive, anemic. Upper endoscopy showed large bleeding gastric ulcer. PMH includes polysubstance abuse, depression, memory disturbance, bipolar affective disorder, schizophrenia, syncope.    PT Comments    Pt received supine and agreeable to session, however limited by fatigue as pt stating she was unable to sleep last night. Pt requesting RW use this session throughout OOB mobility secondary to fatigue and pt stated LE weakness. Pt able to demonstrate increased ambulation tolerance with RW and min assist to manage RW as pt with tendency for R cervical rotation throughout and inattention to environment on L, pt stating this is baseline and RN aware. Encouraged pt to perform ROM throughout day and maintain neutral positioning of head in bed, pt able to demonstrate back and verbalizing understanding. Pt continues to benefit from skilled PT services to progress toward functional mobility goals.   HR 86-99bpm   Recommendations for follow up therapy are one component of a multi-disciplinary discharge planning process, led by the attending physician.  Recommendations may be updated based on patient status, additional functional criteria and insurance authorization.  Follow Up Recommendations  No PT follow up (return to ALF)     Assistance Recommended at Discharge Intermittent Supervision/Assistance  Patient can return home with the following Assistance with cooking/housework;Assist for transportation;Help with stairs or ramp for entrance;Direct supervision/assist for financial management;Direct supervision/assist for medications management   Equipment Recommendations  None recommended by PT    Recommendations for Other Services       Precautions / Restrictions  Precautions Precautions: Fall;Other (comment) Precaution Comments: h/o falls Restrictions Weight Bearing Restrictions: No     Mobility  Bed Mobility Overal bed mobility: Modified Independent             General bed mobility comments: increased time and heavy use of bedrail    Transfers Overall transfer level: Needs assistance Equipment used: None Transfers: Sit to/from Stand Sit to Stand: Min guard           General transfer comment: supervision for safety/lines; able to stand from EOB without assist    Ambulation/Gait Ambulation/Gait assistance: Min assist Gait Distance (Feet): 105 Feet Assistive device: Rolling walker (2 wheels) Gait Pattern/deviations: Step-through pattern, Decreased stride length, Trunk flexed Gait velocity: Decreased     General Gait Details: slow, steady gait with RW (pt request as pt endorsing leg weakness and fatigue), min assist as pt with head turned to R throughout and poor awareness of objects and environment to L. encouraged pt throughout to direct head and gaze forward and scan to left for safety, pt stating this is baseline and RN aware   Stairs             Wheelchair Mobility    Modified Rankin (Stroke Patients Only)       Balance Overall balance assessment: Needs assistance Sitting-balance support: No upper extremity supported, Feet supported Sitting balance-Leahy Scale: Good Sitting balance - Comments: indep to don socks, perform pericare on toilet   Standing balance support: Single extremity supported, During functional activity Standing balance-Leahy Scale: Fair Standing balance comment: maintained at least 1UE supported while grooming at the sink                            Cognition Arousal/Alertness: Awake/alert  Behavior During Therapy: Flat affect Overall Cognitive Status: History of cognitive impairments - at baseline                                 General Comments: pt  answering majority of questions and following simple commands appropriately; flat affect and not forthcoming with info; poor awareness of IV pole and RW with mobility. Answers most questions with one word answers and increased time. Pt reporting inconsistent information, denied DME to PT, reports RW use to OT, reported rollator to MD this session.        Exercises Other Exercises Other Exercises: cervial rotation R/L x10 ea way with focus on holding on L, cervial flexion, extension with focus on keeping head in neutral as pt with tendency to to rotate to R. encouraged neutral head positioning in bed and looking L out window periodically to stretch    General Comments General comments (skin integrity, edema, etc.): VSS on RA      Pertinent Vitals/Pain Pain Assessment Pain Assessment: Faces Faces Pain Scale: Hurts a little bit Pain Location: stomach Pain Descriptors / Indicators: Discomfort Pain Intervention(s): Monitored during session, Limited activity within patient's tolerance    Home Living                          Prior Function            PT Goals (current goals can now be found in the care plan section) Acute Rehab PT Goals Patient Stated Goal: feel better PT Goal Formulation: With patient Time For Goal Achievement: 05/26/22    Frequency    Min 3X/week      PT Plan      Co-evaluation              AM-PAC PT "6 Clicks" Mobility   Outcome Measure  Help needed turning from your back to your side while in a flat bed without using bedrails?: None Help needed moving from lying on your back to sitting on the side of a flat bed without using bedrails?: None Help needed moving to and from a bed to a chair (including a wheelchair)?: A Little Help needed standing up from a chair using your arms (e.g., wheelchair or bedside chair)?: A Little Help needed to walk in hospital room?: A Little Help needed climbing 3-5 steps with a railing? : A Little 6 Click  Score: 20    End of Session Equipment Utilized During Treatment: Gait belt Activity Tolerance: Patient tolerated treatment well;Patient limited by fatigue Patient left: with call bell/phone within reach;in bed Nurse Communication: Mobility status PT Visit Diagnosis: Other abnormalities of gait and mobility (R26.89)     Time: 8786-7672 PT Time Calculation (min) (ACUTE ONLY): 21 min  Charges:  $Therapeutic Activity: 8-22 mins                     Kavonte Bearse R. PTA Acute Rehabilitation Services Office: [phone]    Catalina Antigua 05/13/2022, 9:30 AM

## 2022-05-13 NOTE — Progress Notes (Signed)
PROGRESS NOTE    Danielle [name]  RDE:[id number] DOB: 1951-03-20 DOA: 05/10/2022 PCP: [facility]    Brief Narrative:  71 year old female with history of paranoid schizophrenia, coronary artery disease  chronic debility from assisted living facility brought to the emergency room with fall, finding the patient with dried blood all over.  On arrival blood pressure 77.  Hemoglobin was 4.8.  Resuscitated and admitted to the hospital. Remains in the hospital on protonix drip.   Assessment & Plan:   Acute blood loss anemia Anemia due to upper GI bleeding, bleeding gastric ulcer Hemoglobin 4.8-3 units of PRBC-more than 8 and sustaining well.  Continue to check every 12 hours.  Transfuse for less than 8. Status post EGD, found to have large gastric ulcer with visible blood vessel.  Stabilizing.  Remains on Protonix infusion for 72 hours. Advance to soft diet today.  Will monitor tonight.  Paranoid schizophrenia: Chronic.  Resume her Zoloft, Seroquel and Ingrezza at home doses.  Acute kidney injury: Due to above.  Stabilized and normalized.  Discontinue central line.  Discontinue telemetry.  Mobilize with PT OT.  Anticipate home tomorrow if no further evidence of bleeding.    DVT prophylaxis: SCDs Start: 05/11/22 1323   Code Status: Full code Family Communication: Daughter on the phone Disposition Plan: Status is: Inpatient Remains inpatient appropriate because: Protonix infusion     Consultants:  Gastroenterology  Procedures:  Upper GI endoscopy 7/25  Antimicrobials:  None   Subjective:  Patient seen and examined.  Denies any complaints.  She tells me that she went to bathroom overnight and her stool was clear.  Objective: Vitals:   05/12/22 2019 05/12/22 2308 05/13/22 0439 05/13/22 0832  BP: (!) 106/53 (!) 119/56 123/70 118/75  Pulse: 78 82 77 73  Resp: 20 20 16 20   Temp: 99 F (37.2 C) 98.7 F (37.1 C) 98.2 F (36.8 C) 98.5 F (36.9 C)  TempSrc:  Oral Oral Oral Oral  SpO2: 99% 100% 99% 100%  Weight:      Height:        Intake/Output Summary (Last 24 hours) at 05/13/2022 1059 Last data filed at 05/13/2022 1007 Gross per 24 hour  Intake 240 ml  Output 1025 ml  Net -785 ml   Filed Weights   05/11/22 0201  Weight: 62 kg    Examination:  General exam: Appears calm and comfortable.  Appropriately interactive. Respiratory system: Clear to auscultation. Respiratory effort normal. Cardiovascular system: S1 & S2 heard, RRR. No pedal edema. Gastrointestinal system: Soft nontender.  Bowel sound present. Central nervous system: Alert and oriented. No focal neurological deficits. Extremities: Symmetric 5 x 5 power. Skin: No rashes, lesions or ulcers Psychiatry: Flat affect.    Data Reviewed: I have personally reviewed following labs and imaging studies  CBC: Recent Labs  Lab 05/11/22 0004 05/11/22 0010 05/11/22 1050 05/11/22 1620 05/12/22 0545 05/12/22 1800 05/13/22 0633  WBC 20.3*  --   --   --   --   --   --   HGB 4.8*   < > 9.5* 9.3* 8.3* 8.8* 9.1*  HCT 15.7*   < > 28.0* 26.9* 24.1* 25.5* 27.2*  MCV 96.9  --   --   --   --   --   --   PLT 331  --   --   --   --   --   --    < > = values in this interval not displayed.   Basic Metabolic  Panel: Recent Labs  Lab 05/11/22 0010 05/11/22 1050  NA 139  137 141  K 3.2*  3.8 4.1  CL 111  108 110  CO2 20*  --   GLUCOSE 180*  223* 120*  BUN 51*  50* 37*  CREATININE 1.13*  1.10* 0.80  CALCIUM 8.3*  --    GFR: Estimated Creatinine Clearance: 57.3 mL/min (by C-G formula based on SCr of 0.8 mg/dL). Liver Function Tests: Recent Labs  Lab 05/11/22 0010  AST 18  ALT 13  ALKPHOS 34*  BILITOT <0.1*  PROT 3.7*  ALBUMIN 1.9*   No results for input(s): "LIPASE", "AMYLASE" in the last 168 hours. No results for input(s): "AMMONIA" in the last 168 hours. Coagulation Profile: Recent Labs  Lab 05/11/22 0010  INR 1.5*   Cardiac Enzymes: No results for  input(s): "CKTOTAL", "CKMB", "CKMBINDEX", "TROPONINI" in the last 168 hours. BNP (last 3 results) No results for input(s): "PROBNP" in the last 8760 hours. HbA1C: No results for input(s): "HGBA1C" in the last 72 hours. CBG: Recent Labs  Lab 05/11/22 0002  GLUCAP 232*   Lipid Profile: No results for input(s): "CHOL", "HDL", "LDLCALC", "TRIG", "CHOLHDL", "LDLDIRECT" in the last 72 hours. Thyroid Function Tests: No results for input(s): "TSH", "T4TOTAL", "FREET4", "T3FREE", "THYROIDAB" in the last 72 hours. Anemia Panel: No results for input(s): "VITAMINB12", "FOLATE", "FERRITIN", "TIBC", "IRON", "RETICCTPCT" in the last 72 hours. Sepsis Labs: Recent Labs  Lab 05/11/22 0010 05/11/22 0259  LATICACIDVEN 4.9* 3.2*    Recent Results (from the past 240 hour(s))  Urine Culture     Status: Abnormal   Collection Time: 05/03/22  4:52 PM   Specimen: Urine, Clean Catch  Result Value Ref Range Status   Specimen Description   Final    URINE, CLEAN CATCH Performed at [facility], [address], [place], [place] [address]    Special Requests   Final    NONE Performed at [facility], [address], [place], [place] [address]    Culture (A)  Final    20,000 COLONIES/mL LACTOBACILLUS SPECIES Standardized susceptibility testing for this organism is not available. Performed at [facility], [address], [place], [place] [address]    Report Status 05/05/2022 FINAL  Final         Radiology Studies: CT HEAD WO CONTRAST ( )  Result Date: 05/13/2022 CLINICAL DATA:  72 year old female with severe headache. EXAM: CT HEAD WITHOUT CONTRAST TECHNIQUE: Contiguous axial images were obtained from the base of the skull through the vertex without intravenous contrast. RADIATION DOSE REDUCTION: This exam was performed according to the departmental dose-optimization program which includes automated exposure control, adjustment of the mA and/or kV  according to patient size and/or use of iterative reconstruction technique. COMPARISON:  Brain MRI 12/30/2020.  Head CT 12/29/2020. FINDINGS: Brain: Chronic right PCA territory infarct with encephalomalacia. Patchy bilateral cerebral white matter hypodensity. Chronic basal ganglia vascular calcifications. No midline shift, ventriculomegaly, mass effect, evidence of mass lesion, intracranial hemorrhage or evidence of cortically based acute infarction. Vascular: Calcified atherosclerosis at the skull base. No suspicious intracranial vascular hyperdensity. Skull: No acute osseous abnormality identified. Chronic left TMJ degeneration. Sinuses/Orbits: Visualized paranasal sinuses and mastoids are stable and well aerated. Other: Visualized orbits and scalp soft tissues are within normal limits. IMPRESSION: 1. No acute intracranial abnormality. 2. Chronic right PCA territory infarct and cerebral white matter disease. Electronically Signed   By: Odessa Fleming M.D.   On: 05/13/2022 10:41  Scheduled Meds:  sodium chloride   Intravenous Once   Chlorhexidine Gluconate Cloth  6 each Topical Q0600   nicotine  21 mg Transdermal Daily   pregabalin  150 mg Oral BID   [START ON 05/14/2022] pregabalin  75 mg Oral TID WC & HS   QUEtiapine  100 mg Oral Once   [START ON 05/14/2022] QUEtiapine  200 mg Oral QHS   sertraline  100 mg Oral QHS   sucralfate  1 g Oral Q6H   [START ON 05/14/2022] valbenazine  80 mg Oral Daily   Continuous Infusions:  sodium chloride     sodium chloride Stopped (05/11/22 0200)   pantoprazole 8 mg/hr (05/13/22 0836)     LOS: 2 days    Time spent: 35 minutes    Dorcas Carrow, MD [facility] Pager [phone]

## 2022-05-14 ENCOUNTER — Other Ambulatory Visit (HOSPITAL_COMMUNITY): Payer: Self-pay

## 2022-05-14 DIAGNOSIS — K254 Chronic or unspecified gastric ulcer with hemorrhage: Secondary | ICD-10-CM | POA: Diagnosis not present

## 2022-05-14 DIAGNOSIS — D62 Acute posthemorrhagic anemia: Secondary | ICD-10-CM | POA: Diagnosis not present

## 2022-05-14 DIAGNOSIS — F200 Paranoid schizophrenia: Secondary | ICD-10-CM | POA: Diagnosis not present

## 2022-05-14 LAB — HEMOGLOBIN AND HEMATOCRIT, BLOOD
HCT: 29.4 % — ABNORMAL LOW (ref 36.0–46.0)
Hemoglobin: 10.1 g/dL — ABNORMAL LOW (ref 12.0–15.0)

## 2022-05-14 MED ORDER — PANTOPRAZOLE SODIUM 40 MG PO TBEC
40.0000 mg | DELAYED_RELEASE_TABLET | Freq: Two times a day (BID) | ORAL | 2 refills | Status: DC
  Filled 2022-05-14: qty 60, 30d supply, fill #0

## 2022-05-14 MED ORDER — NICOTINE 21 MG/24HR TD PT24
21.0000 mg | MEDICATED_PATCH | Freq: Every day | TRANSDERMAL | 0 refills | Status: DC
  Filled 2022-05-14: qty 28, 28d supply, fill #0

## 2022-05-14 MED ORDER — SUCRALFATE 1 G PO TABS
1.0000 g | ORAL_TABLET | Freq: Four times a day (QID) | ORAL | 0 refills | Status: DC
  Filled 2022-05-14: qty 120, 30d supply, fill #0

## 2022-05-14 MED ORDER — PANTOPRAZOLE SODIUM 40 MG PO TBEC
40.0000 mg | DELAYED_RELEASE_TABLET | Freq: Two times a day (BID) | ORAL | Status: DC
  Administered 2022-05-14: 40 mg via ORAL
  Filled 2022-05-14: qty 1

## 2022-05-14 NOTE — Care Management Important Message (Signed)
Important Message  Patient Details  Name: Danielle [name] MRN: [id number] Date of Birth: Feb 27, 1951   Medicare Important Message Given:  Yes     Renie Ora 05/14/2022, 9:22 AM

## 2022-05-14 NOTE — NC FL2 (Addendum)
[place] MEDICAID FL2 LEVEL OF CARE SCREENING TOOL     IDENTIFICATION  Patient Name: Danielle [name] Birthdate: [birth year] Sex: female Admission Date (Current Location): 05/10/2022  [facility] and [place] Number:  Producer, television/film/video and Address:  [facility] [place], [address] [place], [place] [address]      Provider Number: [id number]  Attending Physician Name and Address:  Dorcas Carrow, MD  Relative Name and Phone Number:       Current Level of Care: Hospital Recommended Level of Care: Assisted Living Facility Prior Approval Number:    Date Approved/Denied:   PASRR Number:    Discharge Plan: Other (Comment) [facility])    Current Diagnoses: Patient Active Problem List   Diagnosis Date Noted   GI bleeding 05/11/2022   Acute blood loss anemia 05/11/2022   Major depressive disorder, recurrent episode, moderate (HCC)    Dysarthria 12/29/2020   AKI (acute kidney injury) (HCC) 12/29/2020   CAD (coronary artery disease) 03/22/2019   Unstable angina (HCC)    Hypokalemia 08/11/2018   Tobacco abuse 08/11/2018   Memory disturbance 01/30/2016   Schizophrenia, paranoid type (HCC) 04/28/2012   Cocaine abuse (HCC) 04/28/2012   Cannabis abuse 04/28/2012   Substance induced mood disorder (HCC) 04/28/2012   Noncompliance 09/16/2011   Anxiety 09/16/2011   Constipation 09/16/2011   SUBSTANCE ABUSE, MULTIPLE 12/03/2008   HYPERLIPIDEMIA 08/01/2008   MIGRAINE HEADACHE 08/01/2008   ASTHMA 08/01/2008   DEPRESSION 06/20/2008   ANXIETY DISORDER, GENERALIZED 05/28/2008   HYPERTENSION, BENIGN ESSENTIAL 05/28/2008   GERD 05/28/2008   DEEP VENOUS THROMBOPHLEBITIS, LEFT, LEG, HX OF 10/18/1988    Orientation RESPIRATION BLADDER Height & Weight     Self, Time, Situation, Place  Normal Continent Weight: 136 lb 11 oz (62 kg) Height:  5\' 4"  (162.6 cm)  BEHAVIORAL SYMPTOMS/MOOD NEUROLOGICAL BOWEL NUTRITION STATUS      Continent Diet (Regular diet)   AMBULATORY STATUS COMMUNICATION OF NEEDS Skin     Verbally Normal                       Personal Care Assistance Level of Assistance  Bathing, Feeding, Dressing Bathing Assistance: Limited assistance Feeding assistance: Independent Dressing Assistance: Limited assistance     Functional Limitations Info  Sight, Hearing, Speech Sight Info: Adequate Hearing Info: Adequate Speech Info: Adequate    SPECIAL CARE FACTORS FREQUENCY                       Contractures Contractures Info: Not present    Additional Factors Info  Code Status, Allergies Code Status Info: FULL Allergies Info: Clonazepam,Doxycycline,Fosinopril Sodium,Hydrochlorothiazide,Penicillins,Sulfa Antibiotics           Current Medications (05/14/2022):  This is the current hospital active medication list Current Facility-Administered Medications  Medication Dose Route Frequency Provider Last Rate Last Admin   0.9 %  sodium chloride infusion (Manually program via Guardrails IV Fluids)   Intravenous Once 05/16/2022, MD       0.9 %  sodium chloride infusion  10 mL/hr Intravenous Once Dorcas Carrow, DO       0.9 %  sodium chloride infusion  10 mL/hr Intravenous Once Carollee Herter, DO   Held at 05/11/22 0200   acetaminophen (TYLENOL) suppository 650 mg  650 mg Rectal Q4H PRN 05/13/22, DO       albuterol (PROVENTIL) (2.5 MG/3ML) 0.083% nebulizer solution 3 mL  3 mL Inhalation Q6H PRN Carollee Herter, MD  Chlorhexidine Gluconate Cloth 2 % PADS 6 each  6 each Topical Q0600 Dorcas Carrow, MD   6 each at 05/14/22 0549   hydrOXYzine (ATARAX) tablet 25 mg  25 mg Oral Q8H PRN Dorcas Carrow, MD       nicotine (NICODERM CQ - dosed in mg/24 hours) patch 21 mg  21 mg Transdermal Daily Carollee Herter, DO   21 mg at 05/14/22 0846   ondansetron (ZOFRAN) injection 4 mg  4 mg Intravenous Q6H PRN Carollee Herter, DO       pantoprazole (PROTONIX) EC tablet 40 mg  40 mg Oral BID Dorcas Carrow, MD   40 mg at 05/14/22 0917    pregabalin (LYRICA) capsule 75 mg  75 mg Oral TID WC & HS Dorcas Carrow, MD   75 mg at 05/14/22 1222   QUEtiapine (SEROQUEL) tablet 200 mg  200 mg Oral QHS Dorcas Carrow, MD       sertraline (ZOLOFT) tablet 100 mg  100 mg Oral QHS Dorcas Carrow, MD   100 mg at 05/13/22 2137   sucralfate (CARAFATE) 1 GM/10ML suspension 1 g  1 g Oral Q6H Lynann Bologna, MD   1 g at 05/14/22 1222   valbenazine (INGREZZA) capsule 80 mg  80 mg Oral Daily Dorcas Carrow, MD   80 mg at 05/14/22 0845     Discharge Medications:  albuterol (2.5 MG/3ML) 0.083% nebulizer solution Commonly known as: PROVENTIL Take 2.5 mg by nebulization every 6 (six) hours as needed for wheezing or shortness of breath. What changed: Another medication with the same name was changed. Make sure you understand how and when to take each.    albuterol 108 (90 Base) MCG/ACT inhaler Commonly known as: VENTOLIN HFA Inhale 1-2 puffs into the lungs every 6 (six) hours as needed for wheezing or shortness of breath (or cough). What changed: when to take this    amLODipine 5 MG tablet Commonly known as: NORVASC Take 5 mg by mouth daily.    atorvastatin 40 MG tablet Commonly known as: LIPITOR Take 40 mg by mouth daily.    cetirizine 10 MG tablet Commonly known as: ZYRTEC Take 10 mg by mouth daily.    DHEA 25 MG Caps Take 25 mg by mouth daily.    HYDROcodone-acetaminophen 10-325 MG tablet Commonly known as: NORCO Take 1 tablet by mouth daily as needed.    hydrOXYzine 25 MG capsule Commonly known as: VISTARIL Take 25 mg by mouth 2 (two) times daily as needed for anxiety.    Ingrezza 80 MG capsule Generic drug: valbenazine Take 80 mg by mouth daily.    loratadine 10 MG tablet Commonly known as: CLARITIN Take 10 mg by mouth daily.    nicotine 21 mg/24hr patch Commonly known as: NICODERM CQ - dosed in mg/24 hours Place 1 patch (21 mg total) onto the skin daily. Start taking on: May 15, 2022    pantoprazole 40 MG  tablet Commonly known as: PROTONIX Take 1 tablet (40 mg total) by mouth 2 (two) times daily.    pregabalin 75 MG capsule Commonly known as: LYRICA Take 75 mg by mouth in the morning, at noon, in the evening, and at bedtime.    QUEtiapine 200 MG tablet Commonly known as: SEROQUEL Take 200 mg by mouth at bedtime. What changed: Another medication with the same name was removed. Continue taking this medication, and follow the directions you see here.    sertraline 100 MG tablet Commonly known as: ZOLOFT Take 100 mg by mouth  at bedtime.    sucralfate 1 g tablet Commonly known as: Carafate Take 1 tablet (1 g total) by mouth 4 (four) times daily.    tiZANidine 2 MG tablet Commonly known as: ZANAFLEX Take 2 mg by mouth every 8 (eight) hours as needed for muscle spasms.    triamcinolone cream 0.1 % Commonly known as: KENALOG Apply 1 g topically 2 (two) times daily. Apply 1 gram topically. Apply green pea sized bead to popliteal fossae twice a day.    Vitamin D3 250 MCG (10000 UT) Tabs Take 10,000 Units by mouth daily.    Relevant Imaging Results:  Relevant Lab Results:   Additional Information SSN  [id number]  Eduard Roux, LCSW

## 2022-05-14 NOTE — Discharge Summary (Signed)
Physician Discharge Summary  Danielle [name] [id number] DOB: [birth year] DOA: 05/10/2022  [facility]: [facility]  Admit date: 05/10/2022 Discharge date: 05/14/2022  Admitted From: Assisted living facility Disposition: Assisted living facility  Recommendations for Outpatient Follow-up:  Follow up with PCP in 1-2 weeks Please obtain BMP/CBC in one week   Home Health: PT/OT Equipment/Devices: None  Discharge Condition: Stable CODE STATUS: Full code Diet recommendation: Regular, low-salt diet  Discharge summary: 71 year old female with history of paranoid schizophrenia, coronary artery disease,  chronic debility from assisted living facility brought to the emergency room with fall, finding the patient with dried blood all over.  On arrival blood pressure 77.  Hemoglobin was 4.8.  Resuscitated and admitted to the hospital.  She was found to have bleeding gastric ulcer.  Clinically stabilized and going back to the assisted living facility today.  Skeletal survey was negative.   Hemorrhagic shock secondary to acute blood loss anemia, upper GI bleeding due to bleeding gastric ulcer:  Hemoglobin 4.8-3 units of PRBC-more than 8 and sustaining well.  No evidence of further bleeding. Status post EGD, found to have large gastric ulcer with visible blood vessel.  Treated with IV Protonix infusion for last 72 hours.  As per GI recommendation, discharging on Protonix 40 mg twice daily to continue until follow-up by GI.  Added sucralfate 1 g 4 times a day.  Currently tolerating regular diet with normal bowel function.    Paranoid schizophrenia: Chronic.  Resume her Zoloft, Seroquel and Ingrezza at home doses.   Adequately stabilized.  Electrolytes are normalized.  Able to go back to assisted living facility today.  She will benefit with PT OT to work with her at [place].    Discharge Diagnoses:  Principal Problem:   GI bleeding Active Problems:   Acute blood loss anemia    Schizophrenia, paranoid type (HCC)   Hypokalemia   Tobacco abuse    Discharge Instructions  Discharge Instructions     Diet - low sodium heart healthy   Complete by: As directed    Increase activity slowly   Complete by: As directed       Allergies as of 05/14/2022       Reactions   Clonazepam Swelling   Pt was recently given a triamterene hydrochlorothiazide combination as well as clonazepam and had an allergic reaction to one of them. Caused swelling of face and eyes   Doxycycline Swelling   Swelling of face and eyes   Fosinopril Sodium Swelling    swelling face,lips-angioedema from ACE I   Hydrochlorothiazide W-triamterene Swelling   Pt was recently given a triamterene hydrochlorothiazide combination as well as clonazepam and had an allergic reaction to one of them. Swelling of face and eyes   Penicillins Swelling   Face and eyes Has patient had a PCN reaction causing immediate rash, facial/tongue/throat swelling, SOB or lightheadedness with hypotension: Yes Has patient had a PCN reaction causing severe rash involving mucus membranes or skin necrosis: No Has patient had a PCN reaction that required hospitalization: No Has patient had a PCN reaction occurring within the last 10 years: No If all of the above answers are "NO", then may proceed with Cephalosporin use.   Sulfa Antibiotics Other (See Comments)   Unknown reaction        Medication List     STOP taking these medications    aspirin 81 MG chewable tablet   cefdinir 300 MG capsule Commonly known as: OMNICEF   meloxicam 7.5 MG tablet Commonly  known as: MOBIC       TAKE these medications    albuterol (2.5 MG/3ML) 0.083% nebulizer solution Commonly known as: PROVENTIL Take 2.5 mg by nebulization every 6 (six) hours as needed for wheezing or shortness of breath. What changed: Another medication with the same name was changed. Make sure you understand how and when to take each.   albuterol 108 (90  Base) MCG/ACT inhaler Commonly known as: VENTOLIN HFA Inhale 1-2 puffs into the lungs every 6 (six) hours as needed for wheezing or shortness of breath (or cough). What changed: when to take this   amLODipine 5 MG tablet Commonly known as: NORVASC Take 5 mg by mouth daily.   atorvastatin 40 MG tablet Commonly known as: LIPITOR Take 40 mg by mouth daily.   cetirizine 10 MG tablet Commonly known as: ZYRTEC Take 10 mg by mouth daily.   DHEA 25 MG Caps Take 25 mg by mouth daily.   HYDROcodone-acetaminophen 10-325 MG tablet Commonly known as: NORCO Take 1 tablet by mouth daily as needed.   hydrOXYzine 25 MG capsule Commonly known as: VISTARIL Take 25 mg by mouth 2 (two) times daily as needed for anxiety.   Ingrezza 80 MG capsule Generic drug: valbenazine Take 80 mg by mouth daily.   loratadine 10 MG tablet Commonly known as: CLARITIN Take 10 mg by mouth daily.   nicotine 21 mg/24hr patch Commonly known as: NICODERM CQ - dosed in mg/24 hours Place 1 patch (21 mg total) onto the skin daily. Start taking on: May 15, 2022   pantoprazole 40 MG tablet Commonly known as: PROTONIX Take 1 tablet (40 mg total) by mouth 2 (two) times daily.   pregabalin 75 MG capsule Commonly known as: LYRICA Take 75 mg by mouth in the morning, at noon, in the evening, and at bedtime.   QUEtiapine 200 MG tablet Commonly known as: SEROQUEL Take 200 mg by mouth at bedtime. What changed: Another medication with the same name was removed. Continue taking this medication, and follow the directions you see here.   sertraline 100 MG tablet Commonly known as: ZOLOFT Take 100 mg by mouth at bedtime.   sucralfate 1 g tablet Commonly known as: Carafate Take 1 tablet (1 g total) by mouth 4 (four) times daily.   tiZANidine 2 MG tablet Commonly known as: ZANAFLEX Take 2 mg by mouth every 8 (eight) hours as needed for muscle spasms.   triamcinolone cream 0.1 % Commonly known as: KENALOG Apply 1  g topically 2 (two) times daily. Apply 1 gram topically. Apply green pea sized bead to popliteal fossae twice a day.   Vitamin D3 250 MCG (10000 UT) Tabs Take 10,000 Units by mouth daily.        Allergies  Allergen Reactions   Clonazepam Swelling    Pt was recently given a triamterene hydrochlorothiazide combination as well as clonazepam and had an allergic reaction to one of them. Caused swelling of face and eyes   Doxycycline Swelling    Swelling of face and eyes   Fosinopril Sodium Swelling     swelling face,lips-angioedema from ACE I   Hydrochlorothiazide W-Triamterene Swelling    Pt was recently given a triamterene hydrochlorothiazide combination as well as clonazepam and had an allergic reaction to one of them. Swelling of face and eyes   Penicillins Swelling    Face and eyes Has patient had a PCN reaction causing immediate rash, facial/tongue/throat swelling, SOB or lightheadedness with hypotension: Yes Has patient had a  PCN reaction causing severe rash involving mucus membranes or skin necrosis: No Has patient had a PCN reaction that required hospitalization: No Has patient had a PCN reaction occurring within the last 10 years: No If all of the above answers are "NO", then may proceed with Cephalosporin use.    Sulfa Antibiotics Other (See Comments)    Unknown reaction    Consultations: Gastroenterology   Procedures/Studies: CT HEAD WO CONTRAST (5MM)  Result Date: 05/13/2022 CLINICAL DATA:  71 year old female with severe headache. EXAM: CT HEAD WITHOUT CONTRAST TECHNIQUE: Contiguous axial images were obtained from the base of the skull through the vertex without intravenous contrast. RADIATION DOSE REDUCTION: This exam was performed according to the departmental dose-optimization program which includes automated exposure control, adjustment of the mA and/or kV according to patient size and/or use of iterative reconstruction technique. COMPARISON:  Brain MRI 12/30/2020.   Head CT 12/29/2020. FINDINGS: Brain: Chronic right PCA territory infarct with encephalomalacia. Patchy bilateral cerebral white matter hypodensity. Chronic basal ganglia vascular calcifications. No midline shift, ventriculomegaly, mass effect, evidence of mass lesion, intracranial hemorrhage or evidence of cortically based acute infarction. Vascular: Calcified atherosclerosis at the skull base. No suspicious intracranial vascular hyperdensity. Skull: No acute osseous abnormality identified. Chronic left TMJ degeneration. Sinuses/Orbits: Visualized paranasal sinuses and mastoids are stable and well aerated. Other: Visualized orbits and scalp soft tissues are within normal limits. IMPRESSION: 1. No acute intracranial abnormality. 2. Chronic right PCA territory infarct and cerebral white matter disease. Electronically Signed   By: Genevie Ann M.D.   On: 05/13/2022 10:41   DG Chest Port 1 View  Result Date: 05/11/2022 CLINICAL DATA:  Femoral central line EXAM: PORTABLE CHEST 1 VIEW COMPARISON:  12/29/2020 FINDINGS: Lungs are clear.  No pleural effusion or pneumothorax. The heart is normal in size. IMPRESSION: No evidence of acute cardiopulmonary disease. Electronically Signed   By: Julian Hy M.D.   On: 05/11/2022 02:26   CT Abdomen Pelvis W Contrast  Result Date: 05/03/2022 CLINICAL DATA:  Left lower quadrant abdominal pain, constipation, rigid left lower quadrant EXAM: CT ABDOMEN AND PELVIS WITH CONTRAST TECHNIQUE: Multidetector CT imaging of the abdomen and pelvis was performed using the standard protocol following bolus administration of intravenous contrast. RADIATION DOSE REDUCTION: This exam was performed according to the departmental dose-optimization program which includes automated exposure control, adjustment of the mA and/or kV according to patient size and/or use of iterative reconstruction technique. CONTRAST:  171mL OMNIPAQUE IOHEXOL 300 MG/ML  SOLN COMPARISON:  12/30/2020 FINDINGS: Lower  chest: No acute pleural or parenchymal lung disease. Hepatobiliary: No focal liver abnormality is seen. No gallstones, gallbladder wall thickening, or biliary dilatation. Pancreas: Unremarkable. No pancreatic ductal dilatation or surrounding inflammatory changes. Spleen: Normal in size without focal abnormality. Adrenals/Urinary Tract: Adrenal glands are unremarkable. Kidneys are normal, without renal calculi, focal lesion, or hydronephrosis. Bladder is unremarkable. Stomach/Bowel: No bowel obstruction or ileus. Normal appendix right lower quadrant. No bowel wall thickening or inflammatory change. No significant fecal retention. Small hiatal hernia. Vascular/Lymphatic: Aortic atherosclerosis. No enlarged abdominal or pelvic lymph nodes. Reproductive: Status post hysterectomy. No adnexal masses. Other: No free fluid or free intraperitoneal gas. No abdominal wall hernia. Musculoskeletal: No acute or destructive bony lesions. Reconstructed images demonstrate no additional findings. IMPRESSION: 1. No acute intra-abdominal or intrapelvic process. 2.  Aortic Atherosclerosis (ICD10-I70.0). 3. Small hiatal hernia. Electronically Signed   By: Randa Ngo M.D.   On: 05/03/2022 15:11   (Echo, Carotid, EGD, Colonoscopy, ERCP)    Subjective: Patient  seen and examined.  Without any complaints.  Excited to go back to assisted living facility today.  Tolerating regular diet.   Discharge Exam: Vitals:   05/14/22 0349 05/14/22 0757  BP: (!) 108/54 (!) 122/49  Pulse: 84 78  Resp: 15 18  Temp: 98.2 F (36.8 C) 98.7 F (37.1 C)  SpO2: 100% 99%   Vitals:   05/13/22 2003 05/14/22 0027 05/14/22 0349 05/14/22 0757  BP: 126/66 (!) 109/40 (!) 108/54 (!) 122/49  Pulse: 86 78 84 78  Resp: 20 16 15 18   Temp: 98.9 F (37.2 C) 97.9 F (36.6 C) 98.2 F (36.8 C) 98.7 F (37.1 C)  TempSrc: Oral Oral Oral Oral  SpO2: 100% 97% 100% 99%  Weight:      Height:        General: Pt is alert, awake, not in acute  distress [place]macking and flat affect that is mostly chronic findings. Cardiovascular: RRR, S1/S2 +, no rubs, no gallops Respiratory: CTA bilaterally, no wheezing, no rhonchi Abdominal: Soft, NT, ND, bowel sounds + Extremities: no edema, no cyanosis    The results of significant diagnostics from this hospitalization (including imaging, microbiology, ancillary and laboratory) are listed below for reference.     Microbiology: No results found for this or any previous visit (from the past 240 hour(s)).   Labs: BNP (last 3 results) No results for input(s): "BNP" in the last 8760 hours. Basic Metabolic Panel: Recent Labs  Lab 05/11/22 0010 05/11/22 1050  NA 139  137 141  K 3.2*  3.8 4.1  CL 111  108 110  CO2 20*  --   GLUCOSE 180*  223* 120*  BUN 51*  50* 37*  CREATININE 1.13*  1.10* 0.80  CALCIUM 8.3*  --    Liver Function Tests: Recent Labs  Lab 05/11/22 0010  AST 18  ALT 13  ALKPHOS 34*  BILITOT <0.1*  PROT 3.7*  ALBUMIN 1.9*   No results for input(s): "LIPASE", "AMYLASE" in the last 168 hours. No results for input(s): "AMMONIA" in the last 168 hours. CBC: Recent Labs  Lab 05/11/22 0004 05/11/22 0010 05/12/22 0545 05/12/22 1800 05/13/22 0633 05/13/22 1831 05/14/22 0821  WBC 20.3*  --   --   --   --   --   --   HGB 4.8*   < > 8.3* 8.8* 9.1* 9.6* 10.1*  HCT 15.7*   < > 24.1* 25.5* 27.2* 27.5* 29.4*  MCV 96.9  --   --   --   --   --   --   PLT 331  --   --   --   --   --   --    < > = values in this interval not displayed.   Cardiac Enzymes: No results for input(s): "CKTOTAL", "CKMB", "CKMBINDEX", "TROPONINI" in the last 168 hours. BNP: Invalid input(s): "POCBNP" CBG: Recent Labs  Lab 05/11/22 0002  GLUCAP 232*   D-Dimer No results for input(s): "DDIMER" in the last 72 hours. Hgb A1c No results for input(s): "HGBA1C" in the last 72 hours. Lipid Profile No results for input(s): "CHOL", "HDL", "LDLCALC", "TRIG", "CHOLHDL", "LDLDIRECT" in the  last 72 hours. Thyroid function studies No results for input(s): "TSH", "T4TOTAL", "T3FREE", "THYROIDAB" in the last 72 hours.  Invalid input(s): "FREET3" Anemia work up No results for input(s): "VITAMINB12", "FOLATE", "FERRITIN", "TIBC", "IRON", "RETICCTPCT" in the last 72 hours. Urinalysis    Component Value Date/Time   COLORURINE YELLOW 05/03/2022 1534   APPEARANCEUR HAZY (  A) 05/03/2022 1534   LABSPEC 1.030 05/03/2022 1534   PHURINE 5.0 05/03/2022 1534   GLUCOSEU NEGATIVE 05/03/2022 1534   HGBUR NEGATIVE 05/03/2022 1534   HGBUR negative 10/25/2008 0936   BILIRUBINUR NEGATIVE 05/03/2022 1534   KETONESUR NEGATIVE 05/03/2022 1534   PROTEINUR NEGATIVE 05/03/2022 1534   UROBILINOGEN 0.2 03/23/2018 1531   NITRITE NEGATIVE 05/03/2022 1534   LEUKOCYTESUR LARGE (A) 05/03/2022 1534   Sepsis Labs Recent Labs  Lab 05/11/22 0004  WBC 20.3*   Microbiology No results found for this or any previous visit (from the past 240 hour(s)).   Time coordinating discharge:  35 minutes  SIGNED:   Barb Merino, MD  [facility] 05/14/2022, 11:14 AM

## 2022-05-14 NOTE — TOC Transition Note (Signed)
Transition of Care [facility]) - CM/SW Discharge Note   Patient Details  Name: Danielle [name] MRN: [id number] Date of Birth: 30-Jul-1951  Transition of [facility] CM/SW Contact:  Eduard Roux, LCSW Phone Number: 05/14/2022, 1:40 PM   Clinical Narrative:     Patient will Discharge to: [facility] Discharge Date: 05/14/2022 Family Notified: daughter Transport By: daughter  Per MD patient is ready for discharge. RN, patient, and facility notified of discharge. Discharge Summary sent to facility. RN given number for [phone], Lurena Joiner.   Clinical Social Worker signing off.  Antony Blackbird, MSW, LCSW Clinical Social Worker     Final next level of care: Assisted Living Barriers to Discharge: Continued Medical Work up   Patient Goals and CMS Choice        Discharge Placement              [facility] Patient to be transferred to facility by: family Name of family member notified: daughter Patient and family notified of of transfer: 05/14/22  Discharge Plan and Services In-house Referral: Clinical Social Work                                   Social Determinants of Health (SDOH) Interventions     Readmission Risk Interventions     No data to display

## 2022-05-14 NOTE — Progress Notes (Signed)
D/c iv. Went over AVS with daughter. Called report to rebecca, RN at [facility] via [phone].   Lawson Radar, RN

## 2022-05-14 NOTE — TOC Transition Note (Signed)
Transition of Care (TOC) - CM/SW Discharge Note Donn Pierini RN, BSN Transitions of Care Unit 4E- RN Case Manager See Treatment Team for direct phone #    Patient Details  Name: Danielle [name] MRN: [id number] Date of Birth: February 02, 1951  Transition of Care [facility]) CM/SW Contact:  Darrold Span, RN Phone Number: 05/14/2022, 1:50 PM   Clinical Narrative:    Pt stable for transition home today, pt to return to ALF- orders for HHPT/OT. Alerted by CSW - C. Letitia Libra that ALF does not have in-house therapy and choice is up to family.  CM spoke w/ pt and daughter at bedside- list provided for [facility] choice Per CMS guidelines from medicare.gov website with star ratings (copy placed in shadow chart) , daughter states they do not have a preference and defers to this writer to secure on patient's behalf.   Call made to [facility] for [facility] referral- referral has been accepted- with a start of care for first of next week. They will have PT to assessment followed by OT- daughter updated on [facility] at bedside and agreeable with plan.    Final next level of care: Assisted Living Barriers to Discharge: Barriers Resolved   Patient Goals and CMS Choice Patient states their goals for this hospitalization and ongoing recovery are:: return home to ALF CMS Medicare.gov Compare Post Acute Care list provided to:: Patient Represenative (must comment) Choice offered to / list presented to : Adult Children (daughter)  Discharge Placement              [facility] at[facility]) Patient to be transferred to facility by: family Name of family member notified: daughter Patient and family notified of of transfer: 05/14/22  Discharge Plan and Services In-house Referral: Clinical Social Work Discharge Planning Services: CM Consult Post Acute Care Choice: Home Health          DME Arranged: N/A DME Agency: NA       HH Arranged: PT, OT HH Agency: [facility] Date [facility] Agency  Contacted: 05/14/22 Time HH Agency Contacted: [id number] Representative spoke with at [facility] Agency: Kandee Keen  Social Determinants of Health (SDOH) Interventions     Readmission Risk Interventions    05/14/2022    1:50 PM  Readmission Risk Prevention Plan  Transportation Screening Complete  PCP or Specialist Appt within 5-7 Days Complete  Home Care Screening Complete  Medication Review (RN CM) Complete

## 2022-05-15 LAB — TYPE AND SCREEN
ABO/RH(D): O POS
Antibody Screen: NEGATIVE
Unit division: 0
Unit division: 0
Unit division: 0
Unit division: 0

## 2022-05-15 LAB — BPAM RBC
Blood Product Expiration Date: 202308232359
Blood Product Expiration Date: 202308242359
Blood Product Expiration Date: 202308262359
Blood Product Expiration Date: 202308262359
ISSUE DATE / TIME: 202307250142
ISSUE DATE / TIME: 202307250451
ISSUE DATE / TIME: 202307250917
ISSUE DATE / TIME: 202307251221
Unit Type and Rh: 5100
Unit Type and Rh: 5100
Unit Type and Rh: 5100
Unit Type and Rh: 5100

## 2022-05-20 ENCOUNTER — Telehealth: Payer: Self-pay

## 2022-05-20 NOTE — Telephone Encounter (Signed)
-----   Message from Emeline Darling, RN sent at 05/13/2022 10:12 AM EDT ----- Regarding: OV and EGD appointment Spoke with Pt Daughter Suzette Battiest: Suzette Battiest stated that pt is still admitted to the Hospital. Suzette Battiest was notified that we would like to schedule the pt for an office visit and an EGD. Suzette Battiest stated that they will call and schedule once pt is discharged from the hospital:  [name]  verbalized understanding with all questions answered.  Reminder placed in Epic for 1 week to follow up   Sent 05/13/2022

## 2022-05-20 NOTE — Telephone Encounter (Signed)
Reminder received in Epic: Left message for pt or pt daughter to call back:

## 2022-05-24 ENCOUNTER — Other Ambulatory Visit: Payer: Self-pay

## 2022-05-24 ENCOUNTER — Encounter: Payer: Self-pay | Admitting: Gastroenterology

## 2022-05-24 DIAGNOSIS — K254 Chronic or unspecified gastric ulcer with hemorrhage: Secondary | ICD-10-CM

## 2022-05-24 NOTE — Telephone Encounter (Signed)
Spoke with pt   Daughter Suzette Battiest. Suzette Battiest stated that her mom is currently at [facility] assisted living facility: Facility contacted and spoke with Hydia:  Pt was scheduled of an office visit with Mike Gip PA: on 06/22/2022 at 10:00 AM: Hydia made aware: Address provided: Pt was scheduled for an repeat EGD with Dr. Chales Abrahams on 08/11/2022 at 9:00: Prep instructions were created for pt. Instructions were faxed to facility [phone] and mailed to facility:  [facility] [address]  [place] [address] Ambulatory referral to GI placed in Epic Hydia  verbalized understanding with all questions answered.

## 2022-06-22 ENCOUNTER — Ambulatory Visit: Payer: Medicare Other | Admitting: Physician Assistant

## 2022-08-11 ENCOUNTER — Encounter: Payer: Medicare Other | Admitting: Gastroenterology

## 2022-08-25 ENCOUNTER — Ambulatory Visit (AMBULATORY_SURGERY_CENTER): Payer: Self-pay | Admitting: *Deleted

## 2022-08-25 VITALS — Ht 64.0 in | Wt 157.4 lb

## 2022-08-25 DIAGNOSIS — K254 Chronic or unspecified gastric ulcer with hemorrhage: Secondary | ICD-10-CM

## 2022-08-25 NOTE — Progress Notes (Signed)
Patient's chart reviewed by Cathlyn Parsons CNRA prior to previsit and patient appropriate for the [facility].  Previsit completed and red dot placed by patient's name on their procedure day (on provider's schedule).    No egg or soy allergy known to patient  No issues known to pt with past sedation with any surgeries or procedures Patient denies ever being told they had issues or difficulty with intubation  No FH of Malignant Hyperthermia Pt is not on diet pills Pt is not on  home 02  Pt is not on blood thinners  Pt denies issues with constipation  No A fib or A flutter Have any cardiac testing pending--no Pt instructed to use Singlecare.com or GoodRx for a price reduction on prep    PT had a care giver accompanying her today and she assisted with health history review.

## 2022-09-17 ENCOUNTER — Encounter: Payer: Self-pay | Admitting: Gastroenterology

## 2022-09-17 ENCOUNTER — Ambulatory Visit (AMBULATORY_SURGERY_CENTER): Payer: Medicare Other | Admitting: Gastroenterology

## 2022-09-17 VITALS — BP 138/77 | HR 84 | Temp 97.3°F | Resp 15 | Ht 64.0 in | Wt 157.0 lb

## 2022-09-17 DIAGNOSIS — B9681 Helicobacter pylori [H. pylori] as the cause of diseases classified elsewhere: Secondary | ICD-10-CM

## 2022-09-17 DIAGNOSIS — K2951 Unspecified chronic gastritis with bleeding: Secondary | ICD-10-CM | POA: Diagnosis not present

## 2022-09-17 DIAGNOSIS — K2950 Unspecified chronic gastritis without bleeding: Secondary | ICD-10-CM

## 2022-09-17 DIAGNOSIS — K449 Diaphragmatic hernia without obstruction or gangrene: Secondary | ICD-10-CM | POA: Diagnosis not present

## 2022-09-17 DIAGNOSIS — K254 Chronic or unspecified gastric ulcer with hemorrhage: Secondary | ICD-10-CM

## 2022-09-17 MED ORDER — SODIUM CHLORIDE 0.9 % IV SOLN: 500.0000 mL | Freq: Once | INTRAVENOUS | Status: DC

## 2022-09-17 NOTE — Op Note (Signed)
[facility] Patient Name: Danielle [name] Procedure Date: 09/17/2022 9:31 AM MRN: [id number] Endoscopist: Lynann Bologna , MD, [id number] Age: 71 Referring MD:  Date of Birth: 12-Oct-1951 Gender: Female Account #: [id number] Procedure:                Upper GI endoscopy Indications:              Fu GU Medicines:                Monitored Anesthesia Care Procedure:                Pre-Anesthesia Assessment:                           - Prior to the procedure, a History and Physical                            was performed, and patient medications and                            allergies were reviewed. The patient's tolerance of                            previous anesthesia was also reviewed. The risks                            and benefits of the procedure and the sedation                            options and risks were discussed with the patient.                            All questions were answered, and informed consent                            was obtained. Prior Anticoagulants: The patient has                            taken no anticoagulant or antiplatelet agents. ASA                            Grade Assessment: III - A patient with severe                            systemic disease. After reviewing the risks and                            benefits, the patient was deemed in satisfactory                            condition to undergo the procedure.                           After obtaining informed consent, the endoscope was  passed under direct vision. Throughout the                            procedure, the patient's blood pressure, pulse, and                            oxygen saturations were monitored continuously. The                            Endoscope was introduced through the mouth, and                            advanced to the second part of duodenum. The upper                            GI endoscopy was accomplished without  difficulty.                            The patient tolerated the procedure well. Scope In: Scope Out: Findings:                 The examined esophagus was normal with well defined                            Z-line at 35 cm.                           A small hiatal hernia was present.                           Diffuse mild inflammation characterized by erythema                            was found in the entire examined stomach. Biopsies                            were taken with a cold forceps for histology                            Sherron Ales protocol). The ulcer along the lesser                            curvature/angularis has completely healed. Scar was                            visualized at the site of previous ulcer. Multiple                            biopsies were obtained and sent for histology in a                            separate container.                           The examined duodenum  was normal. Complications:            No immediate complications. Estimated Blood Loss:     Estimated blood loss: none. Impression:               - Small hiatal hernia.                           - Gastritis. Biopsied. Recommendation:           - Patient has a contact number available for                            emergencies. The signs and symptoms of potential                            delayed complications were discussed with the                            patient. Return to normal activities tomorrow.                            Written discharge instructions were provided to the                            patient.                           - Resume previous diet.                           - Continue present medications. Can decrease                            Protonix to 40 mg p.o. daily.                           - Avoid nonsteroidals.                           - Await pathology results.                           - The findings and recommendations were discussed                             with the patient's family. Lynann Bologna, MD 09/17/2022 10:04:40 AM This report has been signed electronically.

## 2022-09-17 NOTE — Progress Notes (Signed)
To pacu, VSS. Report to [email or web address].tb 

## 2022-09-17 NOTE — Patient Instructions (Addendum)
- Patient has a contact number available for emergencies. The signs and symptoms of potential delayed complications were discussed with the patient. Return to normal activities tomorrow. Written discharge instructions were provided to the patient. - Resume previous diet. - Continue present medications. Can decrease Protonix to 40 mg p.o. daily. - Avoid nonsteroidals. (Aspirin)  - Await pathology results. - The findings and recommendations were discussed with the patient's family.  YOU HAD AN ENDOSCOPIC PROCEDURE TODAY AT THE [facility]:   Refer to the procedure report that was given to you for any specific questions about what was found during the examination.  If the procedure report does not answer your questions, please call your gastroenterologist to clarify.  If you requested that your care partner not be given the details of your procedure findings, then the procedure report has been included in a sealed envelope for you to review at your convenience later.  YOU SHOULD EXPECT: Some feelings of bloating in the abdomen. Passage of more gas than usual.  Walking can help get rid of the air that was put into your GI tract during the procedure and reduce the bloating. If you had a lower endoscopy (such as a colonoscopy or flexible sigmoidoscopy) you may notice spotting of blood in your stool or on the toilet paper. If you underwent a bowel prep for your procedure, you may not have a normal bowel movement for a few days.  Please Note:  You might notice some irritation and congestion in your nose or some drainage.  This is from the oxygen used during your procedure.  There is no need for concern and it should clear up in a day or so.  SYMPTOMS TO REPORT IMMEDIATELY:   Following upper endoscopy (EGD)  Vomiting of blood or coffee ground material  New chest pain or pain under the shoulder blades  Painful or persistently difficult swallowing  New shortness of breath  Fever of 100F or  higher  Black, tarry-looking stools  For urgent or emergent issues, a gastroenterologist can be reached at any hour by calling [phone]. Do not use MyChart messaging for urgent concerns.    DIET:  We do recommend a small meal at first, but then you may proceed to your regular diet.  Drink plenty of fluids but you should avoid alcoholic beverages for 24 hours.  ACTIVITY:  You should plan to take it easy for the rest of today and you should NOT DRIVE or use heavy machinery until tomorrow (because of the sedation medicines used during the test).    FOLLOW UP: Our staff will call the number listed on your records the next business day following your procedure.  We will call around 7:15- 8:00 am to check on you and address any questions or concerns that you may have regarding the information given to you following your procedure. If we do not reach you, we will leave a message.     If any biopsies were taken you will be contacted by phone or by letter within the next 1-3 weeks.  Please call us at [phone] if you have not heard about the biopsies in 3 weeks.    SIGNATURES/CONFIDENTIALITY: You and/or your care partner have signed paperwork which will be entered into your electronic medical record.  These signatures attest to the fact that that the information above on your After Visit Summary has been reviewed and is understood.  Full responsibility of the confidentiality of this discharge information lies with you  and/or your care-partner. 

## 2022-09-17 NOTE — Progress Notes (Signed)
[facility] History and Physical   Primary Care Physician:  [facility]   Reason for Procedure:   FU GU  Plan:    EGD     HPI: Danielle [name] is a 71 y.o. female    Past Medical History:  Diagnosis Date   Asthma    Bipolar affective disorder, currently depressed, mild (HCC)    Chest pain    Complication of anesthesia    " My eyes swell up "   Coronary artery disease    Depression    Fall 08/25/2012   Recent fall with residual knee and back pain.   Family history of anesthesia complication    " [name] "   GERD (gastroesophageal reflux disease)    Hypertension    Memory disturbance 01/30/2016   Stroke ([place])    Syncope and collapse 03/21/2019   Tardive dyskinesia    Tobacco abuse     Past Surgical History:  Procedure Laterality Date   ABDOMINAL HYSTERECTOMY     BIOPSY  05/11/2022   Procedure: BIOPSY;  Surgeon: Jackquline Denmark, MD;  Location: [place];  Service: Gastroenterology;;   CARDIAC CATHETERIZATION     ESOPHAGOGASTRODUODENOSCOPY (EGD) WITH PROPOFOL N/A 05/11/2022   Procedure: ESOPHAGOGASTRODUODENOSCOPY (EGD) WITH PROPOFOL;  Surgeon: Jackquline Denmark, MD;  Location: [place];  Service: Gastroenterology;  Laterality: N/A;   HEMOSTASIS CONTROL  05/11/2022   Procedure: HEMOSTASIS CONTROL;  Surgeon: Jackquline Denmark, MD;  Location[facility];  Service: Gastroenterology;;   HOT HEMOSTASIS N/A 05/11/2022   Procedure: HOT HEMOSTASIS (ARGON PLASMA COAGULATION/BICAP);  Surgeon: Jackquline Denmark, MD;  Location: [facility] ENDOSCOPY;  Service: Gastroenterology;  Laterality: N/A;   RIGHT/LEFT HEART CATH AND CORONARY ANGIOGRAPHY N/A 08/14/2018   Procedure: RIGHT/LEFT HEART CATH AND CORONARY ANGIOGRAPHY;  Surgeon: Leonie Man, MD;  Location: [place] CV LAB;  Service: Cardiovascular;  Laterality: N/A;    Prior to Admission medications   Medication Sig Start Date End Date Taking? Authorizing Provider  albuterol (PROVENTIL HFA;VENTOLIN HFA) 108  (90 Base) MCG/ACT inhaler Inhale 1-2 puffs into the lungs every 6 (six) hours as needed for wheezing or shortness of breath (or cough). Patient taking differently: Inhale 1-2 puffs into the lungs every 4 (four) hours as needed for wheezing or shortness of breath (or cough). 12/31/18  Yes Madilyn Hook A, PA-C  albuterol (PROVENTIL) (2.5 MG/3ML) 0.083% nebulizer solution Take 2.5 mg by nebulization every 6 (six) hours as needed for wheezing or shortness of breath.   Yes [provider]  amLODipine (NORVASC) 5 MG tablet Take 5 mg by mouth daily.   Yes [provider]  atorvastatin (LIPITOR) 40 MG tablet Take 40 mg by mouth daily.   Yes [provider]  cetirizine (ZYRTEC) 10 MG tablet Take 10 mg by mouth daily.   Yes [provider]  Cholecalciferol (VITAMIN D3) 250 MCG (10000 UT) TABS Take 10,000 Units by mouth daily.   Yes [provider]  DHEA 25 MG CAPS Take 25 mg by mouth daily.   Yes [provider]  hydrOXYzine (VISTARIL) 25 MG capsule Take 25 mg by mouth 2 (two) times daily as needed for anxiety.   Yes [provider]  INGREZZA 80 MG capsule Take 80 mg by mouth daily. 04/01/22  Yes [provider]  loratadine (CLARITIN) 10 MG tablet Take 10 mg by mouth daily.   Yes [provider]  pregabalin (LYRICA) 75 MG capsule Take 75 mg by mouth in the morning, at noon, in the  evening, and at bedtime. 04/07/22  Yes [provider]  QUEtiapine (SEROQUEL) 200 MG tablet Take 200 mg by mouth at bedtime. 05/06/22  Yes [provider]  sertraline (ZOLOFT) 100 MG tablet Take 100 mg by mouth at bedtime.   Yes [provider]  HYDROcodone-acetaminophen (NORCO) 10-325 MG tablet Take 1 tablet by mouth daily as needed. 05/09/22   [provider]  nicotine (NICODERM CQ - DOSED IN MG/24 HOURS) 21 mg/24hr patch Place 1 patch (21 mg total) onto the skin daily. 05/15/22   Barb Merino, MD  pantoprazole  (PROTONIX) 40 MG tablet Take 1 tablet (40 mg total) by mouth 2 (two) times daily. 05/14/22 08/25/22  Barb Merino, MD  sucralfate (CARAFATE) 1 g tablet Take 1 tablet (1 g total) by mouth 4 (four) times daily. 05/14/22 08/25/22  Barb Merino, MD  tiZANidine (ZANAFLEX) 2 MG tablet Take 2 mg by mouth every 8 (eight) hours as needed for muscle spasms.    [provider]  triamcinolone cream (KENALOG) 0.1 % Apply 1 g topically 2 (two) times daily. Apply 1 gram topically. Apply green pea sized bead to popliteal fossae twice a day. 04/27/22   [provider]    Current Outpatient Medications  Medication Sig Dispense Refill   albuterol (PROVENTIL HFA;VENTOLIN HFA) 108 (90 Base) MCG/ACT inhaler Inhale 1-2 puffs into the lungs every 6 (six) hours as needed for wheezing or shortness of breath (or cough). (Patient taking differently: Inhale 1-2 puffs into the lungs every 4 (four) hours as needed for wheezing or shortness of breath (or cough).) 1 Inhaler 0   albuterol (PROVENTIL) (2.5 MG/3ML) 0.083% nebulizer solution Take 2.5 mg by nebulization every 6 (six) hours as needed for wheezing or shortness of breath.     amLODipine (NORVASC) 5 MG tablet Take 5 mg by mouth daily.     atorvastatin (LIPITOR) 40 MG tablet Take 40 mg by mouth daily.     cetirizine (ZYRTEC) 10 MG tablet Take 10 mg by mouth daily.     Cholecalciferol (VITAMIN D3) 250 MCG (10000 UT) TABS Take 10,000 Units by mouth daily.     DHEA 25 MG CAPS Take 25 mg by mouth daily.     hydrOXYzine (VISTARIL) 25 MG capsule Take 25 mg by mouth 2 (two) times daily as needed for anxiety.     INGREZZA 80 MG capsule Take 80 mg by mouth daily.     loratadine (CLARITIN) 10 MG tablet Take 10 mg by mouth daily.     pregabalin (LYRICA) 75 MG capsule Take 75 mg by mouth in the morning, at noon, in the evening, and at bedtime.     QUEtiapine (SEROQUEL) 200 MG tablet Take 200 mg by mouth at bedtime.     sertraline (ZOLOFT) 100 MG tablet Take 100 mg by  mouth at bedtime.     HYDROcodone-acetaminophen (NORCO) 10-325 MG tablet Take 1 tablet by mouth daily as needed.     nicotine (NICODERM CQ - DOSED IN MG/24 HOURS) 21 mg/24hr patch Place 1 patch (21 mg total) onto the skin daily. 28 patch 0   pantoprazole (PROTONIX) 40 MG tablet Take 1 tablet (40 mg total) by mouth 2 (two) times daily. 60 tablet 2   sucralfate (CARAFATE) 1 g tablet Take 1 tablet (1 g total) by mouth 4 (four) times daily. 120 tablet 0   tiZANidine (ZANAFLEX) 2 MG tablet Take 2 mg by mouth every 8 (eight) hours as needed for muscle spasms.  triamcinolone cream (KENALOG) 0.1 % Apply 1 g topically 2 (two) times daily. Apply 1 gram topically. Apply green pea sized bead to popliteal fossae twice a day.     No current facility-administered medications for this visit.    Allergies as of 09/17/2022 - Review Complete 09/17/2022  Allergen Reaction Noted   Clonazepam Swelling 08/25/2012   Doxycycline Swelling 05/24/2008   Fosinopril sodium Swelling 10/25/2008   Hydrochlorothiazide w-triamterene Swelling 08/25/2012   Penicillins Swelling 05/24/2008   Sulfa antibiotics Other (See Comments) 01/30/2016    Family History  Problem Relation Age of Onset   Hypertension Mother    Stroke Mother    Heart attack Mother    Heart attack Father    Heart attack Sister    Heart attack Brother    Dementia Neg Hx    Colon cancer Neg Hx    Esophageal cancer Neg Hx    Stomach cancer Neg Hx     Social History   Socioeconomic History   Marital status: Divorced    Spouse name: Not on file   Number of children: 2   Years of education: 12   Highest education level: Not on file  Occupational History   Occupation: Retired Advertising copywriter  Tobacco Use   Smoking status: Every Day    Packs/day: 1.00    Types: Cigarettes    Last attempt to quit: 08/21/2018    Years since quitting: 4.0   Smokeless tobacco: Never  Vaping Use   Vaping Use: Never used  Substance and Sexual Activity   Alcohol  use: Not Currently    Alcohol/week: 0.0 standard drinks of alcohol    Comment: About 1 qt per week   Drug use: Not Currently    Types: Cocaine, Marijuana    Comment: Former user   Sexual activity: Not Currently  Other Topics Concern   Not on file  Social History Narrative   Lives at home alone   Can use both hands   Denies caffeine use   Social Determinants of Health   Financial Resource Strain: Not on file  Food Insecurity: Not on file  Transportation Needs: Not on file  Physical Activity: Not on file  Stress: Not on file  Social Connections: Not on file  Intimate Partner Violence: Not on file    Review of Systems: Positive for none All other review of systems negative except as mentioned in the HPI.  Physical Exam: Vital signs in last 24 hours: @VSRANGES @   General:   Alert,  Well-developed, well-nourished, pleasant and cooperative in NAD Lungs:  Clear throughout to auscultation.   Heart:  Regular rate and rhythm; no murmurs, clicks, rubs,  or gallops. Abdomen:  Soft, nontender and nondistended. Normal bowel sounds.   Neuro/Psych:  Alert and cooperative. Normal mood and affect. A and O x 3    No significant changes were identified.  The patient continues to be an appropriate candidate for the planned procedure and anesthesia.   , MD. [facility] 09/17/2022 11:39 AM@

## 2022-09-17 NOTE — Progress Notes (Signed)
Pt's states no medical or surgical changes since previsit or office visit. 

## 2022-09-20 ENCOUNTER — Telehealth: Payer: Self-pay | Admitting: *Deleted

## 2022-09-20 NOTE — Telephone Encounter (Signed)
  Follow up Call-     09/17/2022    9:25 AM  Call back number  Post procedure Call Back phone  # [phone]  Permission to leave phone message Yes     Patient questions:  Do you have a fever, pain , or abdominal swelling? No. Pain Score  0 *  Have you tolerated food without any problems? Yes.    Have you been able to return to your normal activities? Yes.    Do you have any questions about your discharge instructions: Diet   No. Medications  No. Follow up visit  No.  Do you have questions or concerns about your Care? No.  Actions: * If pain score is 4 or above: No action needed, pain <4.

## 2022-09-26 ENCOUNTER — Encounter: Payer: Self-pay | Admitting: Gastroenterology

## 2022-09-27 ENCOUNTER — Other Ambulatory Visit: Payer: Self-pay

## 2022-09-27 DIAGNOSIS — B9681 Helicobacter pylori [H. pylori] as the cause of diseases classified elsewhere: Secondary | ICD-10-CM

## 2022-09-27 MED ORDER — CLARITHROMYCIN 500 MG PO TABS: 500.0000 mg | ORAL_TABLET | Freq: Two times a day (BID) | ORAL | 0 refills | Status: AC

## 2022-09-27 MED ORDER — METRONIDAZOLE 250 MG PO TABS: 250.0000 mg | ORAL_TABLET | Freq: Four times a day (QID) | ORAL | 0 refills | Status: AC

## 2022-09-27 MED ORDER — PANTOPRAZOLE SODIUM 40 MG PO TBEC: 40.0000 mg | DELAYED_RELEASE_TABLET | Freq: Two times a day (BID) | ORAL | 0 refills | Status: DC

## 2022-09-27 MED ORDER — BISMUTH SUBSALICYLATE 262 MG PO CHEW: 524.0000 mg | CHEWABLE_TABLET | Freq: Four times a day (QID) | ORAL | 0 refills | Status: AC

## 2022-09-28 ENCOUNTER — Telehealth: Payer: Self-pay | Admitting: Gastroenterology

## 2022-09-28 NOTE — Telephone Encounter (Signed)
Inbound call from Lurena Joiner at [facility] requesting a call back to discuss biaxin. Please advise.

## 2022-09-29 NOTE — Telephone Encounter (Signed)
Contacted Lurena Joiner at [facility] at [phone] in regard to previous message: Lurena Joiner stated that the pharmacy stated that the Clarithromycin that was prescribed is conflicting with the Seroquel that the pt takes:  Please advise

## 2022-09-30 NOTE — Telephone Encounter (Signed)
can reduce Seroquel to half the dose until she is on Biaxin  please run it by pharmacy to make sure it is okay RG

## 2022-10-01 NOTE — Telephone Encounter (Signed)
Left message for Danielle [name] to call back: Direct number provided:

## 2022-10-04 NOTE — Telephone Encounter (Signed)
Lurena Joiner called to follow up on previous message.

## 2022-10-04 NOTE — Telephone Encounter (Unsigned)
Left message for Danielle [name] to call back: Direct number provided:  

## 2022-10-05 NOTE — Telephone Encounter (Signed)
Danielle [name] was made aware of Dr. Chales Abrahams recommendations: Can reduce Seroquel to half the dose until she is on Biaxin  but needs to be  run by pharmacy to make sure it is okay Lurena Joiner verbalized understanding with all questions answered.

## 2022-10-15 ENCOUNTER — Inpatient Hospital Stay (HOSPITAL_COMMUNITY)
Admission: EM | Admit: 2022-10-15 | Discharge: 2022-10-19 | DRG: 190 | Disposition: A | Discharge status: Nursing Home (Includes ICF) | Payer: Medicare Other | Source: Skilled Nursing Facility | Attending: Internal Medicine | Admitting: Internal Medicine

## 2022-10-15 ENCOUNTER — Emergency Department (HOSPITAL_COMMUNITY): Payer: Medicare Other

## 2022-10-15 ENCOUNTER — Other Ambulatory Visit: Payer: Self-pay

## 2022-10-15 ENCOUNTER — Encounter (HOSPITAL_COMMUNITY): Payer: Self-pay

## 2022-10-15 DIAGNOSIS — J9601 Acute respiratory failure with hypoxia: Secondary | ICD-10-CM | POA: Diagnosis not present

## 2022-10-15 DIAGNOSIS — Z823 Family history of stroke: Secondary | ICD-10-CM

## 2022-10-15 DIAGNOSIS — Z8249 Family history of ischemic heart disease and other diseases of the circulatory system: Secondary | ICD-10-CM

## 2022-10-15 DIAGNOSIS — R112 Nausea with vomiting, unspecified: Secondary | ICD-10-CM | POA: Diagnosis not present

## 2022-10-15 DIAGNOSIS — R0602 Shortness of breath: Principal | ICD-10-CM

## 2022-10-15 DIAGNOSIS — J441 Chronic obstructive pulmonary disease with (acute) exacerbation: Secondary | ICD-10-CM | POA: Diagnosis not present

## 2022-10-15 DIAGNOSIS — E785 Hyperlipidemia, unspecified: Secondary | ICD-10-CM | POA: Diagnosis present

## 2022-10-15 DIAGNOSIS — F17210 Nicotine dependence, cigarettes, uncomplicated: Secondary | ICD-10-CM | POA: Diagnosis present

## 2022-10-15 DIAGNOSIS — Z86718 Personal history of other venous thrombosis and embolism: Secondary | ICD-10-CM

## 2022-10-15 DIAGNOSIS — F200 Paranoid schizophrenia: Secondary | ICD-10-CM | POA: Diagnosis present

## 2022-10-15 DIAGNOSIS — I10 Essential (primary) hypertension: Secondary | ICD-10-CM | POA: Diagnosis present

## 2022-10-15 DIAGNOSIS — Z90710 Acquired absence of both cervix and uterus: Secondary | ICD-10-CM

## 2022-10-15 DIAGNOSIS — Z880 Allergy status to penicillin: Secondary | ICD-10-CM

## 2022-10-15 DIAGNOSIS — Z882 Allergy status to sulfonamides status: Secondary | ICD-10-CM

## 2022-10-15 DIAGNOSIS — I2510 Atherosclerotic heart disease of native coronary artery without angina pectoris: Secondary | ICD-10-CM | POA: Diagnosis present

## 2022-10-15 DIAGNOSIS — Z8673 Personal history of transient ischemic attack (TIA), and cerebral infarction without residual deficits: Secondary | ICD-10-CM

## 2022-10-15 DIAGNOSIS — Z888 Allergy status to other drugs, medicaments and biological substances status: Secondary | ICD-10-CM

## 2022-10-15 DIAGNOSIS — E876 Hypokalemia: Secondary | ICD-10-CM | POA: Diagnosis present

## 2022-10-15 DIAGNOSIS — Z1152 Encounter for screening for COVID-19: Secondary | ICD-10-CM

## 2022-10-15 DIAGNOSIS — B9789 Other viral agents as the cause of diseases classified elsewhere: Secondary | ICD-10-CM | POA: Diagnosis present

## 2022-10-15 LAB — D-DIMER, QUANTITATIVE: D-Dimer, Quant: 0.41 ug/mL-FEU (ref 0.00–0.50)

## 2022-10-15 LAB — RESP PANEL BY RT-PCR (RSV, FLU A&B, COVID)  RVPGX2
Influenza A by PCR: NEGATIVE
Influenza B by PCR: NEGATIVE
Resp Syncytial Virus by PCR: NEGATIVE
SARS Coronavirus 2 by RT PCR: NEGATIVE

## 2022-10-15 LAB — COMPREHENSIVE METABOLIC PANEL
ALT: 16 U/L (ref 0–44)
AST: 21 U/L (ref 15–41)
Albumin: 4.3 g/dL (ref 3.5–5.0)
Alkaline Phosphatase: 65 U/L (ref 38–126)
Anion gap: 7 (ref 5–15)
BUN: 15 mg/dL (ref 8–23)
CO2: 28 mmol/L (ref 22–32)
Calcium: 10 mg/dL (ref 8.9–10.3)
Chloride: 106 mmol/L (ref 98–111)
Creatinine, Ser: 0.8 mg/dL (ref 0.44–1.00)
GFR, Estimated: >60 mL/min (ref >60)
Glucose, Bld: 103 mg/dL — ABNORMAL HIGH (ref 70–99)
Potassium: 3.6 mmol/L (ref 3.5–5.1)
Sodium: 141 mmol/L (ref 135–145)
Total Bilirubin: 0.7 mg/dL (ref 0.3–1.2)
Total Protein: 7.9 g/dL (ref 6.5–8.1)

## 2022-10-15 LAB — BLOOD GAS, VENOUS
Acid-Base Excess: 6 mmol/L — ABNORMAL HIGH (ref 0.0–2.0)
Bicarbonate: 32.2 mmol/L — ABNORMAL HIGH (ref 20.0–28.0)
O2 Saturation: 85.9 %
Patient temperature: 37
pCO2, Ven: 52 mmHg (ref 44–60)
pH, Ven: 7.4 (ref 7.25–7.43)
pO2, Ven: 55 mmHg — ABNORMAL HIGH (ref 32–45)

## 2022-10-15 LAB — BRAIN NATRIURETIC PEPTIDE: B Natriuretic Peptide: 48.2 pg/mL (ref 0.0–100.0)

## 2022-10-15 LAB — CBC WITH DIFFERENTIAL/PLATELET
Abs Immature Granulocytes: 0.06 10*3/uL (ref 0.00–0.07)
Basophils Absolute: 0 10*3/uL (ref 0.0–0.1)
Basophils Relative: 0 %
Eosinophils Absolute: 0.1 10*3/uL (ref 0.0–0.5)
Eosinophils Relative: 1 %
HCT: 41.3 % (ref 36.0–46.0)
Hemoglobin: 13.5 g/dL (ref 12.0–15.0)
Immature Granulocytes: 1 %
Lymphocytes Relative: 15 %
Lymphs Abs: 1.6 10*3/uL (ref 0.7–4.0)
MCH: 28.8 pg (ref 26.0–34.0)
MCHC: 32.7 g/dL (ref 30.0–36.0)
MCV: 88.2 fL (ref 80.0–100.0)
Monocytes Absolute: 1.1 10*3/uL — ABNORMAL HIGH (ref 0.1–1.0)
Monocytes Relative: 10 %
Neutro Abs: 7.9 10*3/uL — ABNORMAL HIGH (ref 1.7–7.7)
Neutrophils Relative %: 73 %
Platelets: 272 10*3/uL (ref 150–400)
RBC: 4.68 MIL/uL (ref 3.87–5.11)
RDW: 17.5 % — ABNORMAL HIGH (ref 11.5–15.5)
WBC: 10.8 10*3/uL — ABNORMAL HIGH (ref 4.0–10.5)
nRBC: 0 % (ref 0.0–0.2)

## 2022-10-15 LAB — TROPONIN I (HIGH SENSITIVITY)
Troponin I (High Sensitivity): 9 ng/L (ref <18)
Troponin I (High Sensitivity): 9 ng/L (ref <18)

## 2022-10-15 MED ORDER — LACTATED RINGERS IV BOLUS
1000.0000 mL | Freq: Once | INTRAVENOUS | Status: AC
  Administered 2022-10-15: 1000 mL via INTRAVENOUS

## 2022-10-15 MED ORDER — METHYLPREDNISOLONE SODIUM SUCC 125 MG IJ SOLR
125.0000 mg | Freq: Once | INTRAMUSCULAR | Status: AC
  Administered 2022-10-15: 125 mg via INTRAVENOUS
  Filled 2022-10-15: qty 2

## 2022-10-15 MED ORDER — ONDANSETRON HCL 4 MG/2ML IJ SOLN: 4.0000 mg | Freq: Four times a day (QID) | INTRAMUSCULAR | Status: DC | PRN

## 2022-10-15 MED ORDER — ACETAMINOPHEN 650 MG RE SUPP: 650.0000 mg | Freq: Four times a day (QID) | RECTAL | Status: DC | PRN

## 2022-10-15 MED ORDER — PANTOPRAZOLE SODIUM 40 MG PO TBEC
40.0000 mg | DELAYED_RELEASE_TABLET | Freq: Every day | ORAL | Status: DC
  Administered 2022-10-16 – 2022-10-19 (×4): 40 mg via ORAL
  Filled 2022-10-15 (×4): qty 1

## 2022-10-15 MED ORDER — METHYLPREDNISOLONE SODIUM SUCC 125 MG IJ SOLR
60.0000 mg | Freq: Two times a day (BID) | INTRAMUSCULAR | Status: DC
  Administered 2022-10-16 – 2022-10-18 (×5): 60 mg via INTRAVENOUS
  Filled 2022-10-15 (×5): qty 2

## 2022-10-15 MED ORDER — BUDESONIDE 0.5 MG/2ML IN SUSP
2.0000 mg | Freq: Two times a day (BID) | RESPIRATORY_TRACT | Status: DC
  Administered 2022-10-15 – 2022-10-18 (×6): 2 mg via RESPIRATORY_TRACT
  Filled 2022-10-15 (×6): qty 8

## 2022-10-15 MED ORDER — ALBUTEROL SULFATE (2.5 MG/3ML) 0.083% IN NEBU
2.5000 mg | INHALATION_SOLUTION | RESPIRATORY_TRACT | Status: DC | PRN
  Administered 2022-10-18: 2.5 mg via RESPIRATORY_TRACT
  Filled 2022-10-15: qty 3

## 2022-10-15 MED ORDER — SODIUM CHLORIDE 0.45 % IV SOLN: INTRAVENOUS | Status: DC

## 2022-10-15 MED ORDER — PREGABALIN 75 MG PO CAPS
75.0000 mg | ORAL_CAPSULE | Freq: Three times a day (TID) | ORAL | Status: DC
  Administered 2022-10-15 – 2022-10-19 (×11): 75 mg via ORAL
  Filled 2022-10-15 (×11): qty 1

## 2022-10-15 MED ORDER — HYDROXYZINE PAMOATE 25 MG PO CAPS: 25.0000 mg | ORAL_CAPSULE | Freq: Two times a day (BID) | ORAL | Status: DC | PRN

## 2022-10-15 MED ORDER — AZITHROMYCIN 250 MG PO TABS: 500.0000 mg | ORAL_TABLET | Freq: Once | ORAL | Status: DC

## 2022-10-15 MED ORDER — ONDANSETRON HCL 4 MG PO TABS: 4.0000 mg | ORAL_TABLET | Freq: Four times a day (QID) | ORAL | Status: DC | PRN

## 2022-10-15 MED ORDER — IPRATROPIUM-ALBUTEROL 0.5-2.5 (3) MG/3ML IN SOLN
9.0000 mL | Freq: Once | RESPIRATORY_TRACT | Status: AC
  Administered 2022-10-15: 9 mL via RESPIRATORY_TRACT
  Filled 2022-10-15: qty 3

## 2022-10-15 MED ORDER — VALBENAZINE TOSYLATE 40 MG PO CAPS
80.0000 mg | ORAL_CAPSULE | Freq: Every day | ORAL | Status: DC
  Administered 2022-10-16 – 2022-10-19 (×4): 80 mg via ORAL
  Filled 2022-10-15 (×4): qty 2

## 2022-10-15 MED ORDER — TIZANIDINE HCL 2 MG PO TABS: 2.0000 mg | ORAL_TABLET | Freq: Three times a day (TID) | ORAL | Status: DC | PRN

## 2022-10-15 MED ORDER — IPRATROPIUM-ALBUTEROL 0.5-2.5 (3) MG/3ML IN SOLN
3.0000 mL | Freq: Four times a day (QID) | RESPIRATORY_TRACT | Status: DC
  Administered 2022-10-15 – 2022-10-17 (×7): 3 mL via RESPIRATORY_TRACT
  Filled 2022-10-15 (×6): qty 3

## 2022-10-15 MED ORDER — ACETAMINOPHEN 325 MG PO TABS: 650.0000 mg | ORAL_TABLET | Freq: Four times a day (QID) | ORAL | Status: DC | PRN

## 2022-10-15 MED ORDER — SUCRALFATE 1 G PO TABS
1.0000 g | ORAL_TABLET | Freq: Four times a day (QID) | ORAL | Status: DC
  Administered 2022-10-15 – 2022-10-19 (×13): 1 g via ORAL
  Filled 2022-10-15 (×13): qty 1

## 2022-10-15 MED ORDER — SERTRALINE HCL 100 MG PO TABS
100.0000 mg | ORAL_TABLET | Freq: Every day | ORAL | Status: DC
  Administered 2022-10-15 – 2022-10-18 (×4): 100 mg via ORAL
  Filled 2022-10-15 (×2): qty 1
  Filled 2022-10-15: qty 2
  Filled 2022-10-15: qty 1

## 2022-10-15 MED ORDER — HYDROXYZINE HCL 25 MG PO TABS
25.0000 mg | ORAL_TABLET | Freq: Two times a day (BID) | ORAL | Status: DC | PRN
  Administered 2022-10-17: 25 mg via ORAL
  Filled 2022-10-15: qty 1

## 2022-10-15 MED ORDER — QUETIAPINE FUMARATE 100 MG PO TABS
200.0000 mg | ORAL_TABLET | Freq: Every day | ORAL | Status: DC
  Administered 2022-10-15 – 2022-10-18 (×4): 200 mg via ORAL
  Filled 2022-10-15 (×4): qty 2

## 2022-10-15 MED ORDER — LORATADINE 10 MG PO TABS
10.0000 mg | ORAL_TABLET | Freq: Every day | ORAL | Status: DC
  Administered 2022-10-16 – 2022-10-19 (×4): 10 mg via ORAL
  Filled 2022-10-15 (×4): qty 1

## 2022-10-15 MED ORDER — AMLODIPINE BESYLATE 5 MG PO TABS
5.0000 mg | ORAL_TABLET | Freq: Every day | ORAL | Status: DC
  Administered 2022-10-16 – 2022-10-19 (×4): 5 mg via ORAL
  Filled 2022-10-15 (×4): qty 1

## 2022-10-15 MED ORDER — AZITHROMYCIN 250 MG PO TABS
500.0000 mg | ORAL_TABLET | Freq: Every day | ORAL | Status: AC
  Administered 2022-10-16 – 2022-10-19 (×4): 500 mg via ORAL
  Filled 2022-10-15 (×4): qty 2

## 2022-10-15 MED ORDER — ATORVASTATIN CALCIUM 40 MG PO TABS
40.0000 mg | ORAL_TABLET | Freq: Every day | ORAL | Status: DC
  Administered 2022-10-16 – 2022-10-19 (×4): 40 mg via ORAL
  Filled 2022-10-15 (×4): qty 1

## 2022-10-15 MED ORDER — SODIUM CHLORIDE 0.9 % IV SOLN
500.0000 mg | Freq: Once | INTRAVENOUS | Status: AC
  Administered 2022-10-15: 500 mg via INTRAVENOUS
  Filled 2022-10-15: qty 5

## 2022-10-15 MED ORDER — ENOXAPARIN SODIUM 40 MG/0.4ML IJ SOSY
40.0000 mg | PREFILLED_SYRINGE | INTRAMUSCULAR | Status: DC
  Administered 2022-10-15 – 2022-10-18 (×4): 40 mg via SUBCUTANEOUS
  Filled 2022-10-15 (×4): qty 0.4

## 2022-10-15 NOTE — ED Provider Notes (Signed)
[facility]-EMERGENCY DEPT Provider Note   CSN: [id number] Arrival date & time: 10/15/22  1420     History  Chief Complaint  Patient presents with   Shortness of Breath    Danielle [name] is a 71 y.o. female.  Smoker With PMH of paranoid schizophrenia, CAD, asthma/COPD, h/o DVT not on AC who presents from assisted living with shortness of breath.  Patient complaining of increased shortness of breath over the past 3 days worse with ambulation.  She has also had associated increased cough from baseline that is nonproductive with congestion.  She denies any fevers.  Has had intermittent posttussive nonbloody nonbilious emesis.  No diarrhea, no chest pain, no abdominal pain.  Denying any increased pain or swelling in the lower extremities.  No orthopnea or PND.  She is not on oxygen at baseline.  She has not been using any inhalers.  She denies any recent antibiotic or steroid use.   Shortness of Breath      Home Medications Prior to Admission medications   Medication Sig Start Date End Date Taking? Authorizing Provider  albuterol (PROVENTIL HFA;VENTOLIN HFA) 108 (90 Base) MCG/ACT inhaler Inhale 1-2 puffs into the lungs every 6 (six) hours as needed for wheezing or shortness of breath (or cough). Patient taking differently: Inhale 1-2 puffs into the lungs every 4 (four) hours as needed for wheezing or shortness of breath (or cough). 12/31/18  Yes Ronnie Doss A, PA-C  albuterol (PROVENTIL) (2.5 MG/3ML) 0.083% nebulizer solution Take 2.5 mg by nebulization every 6 (six) hours as needed for shortness of breath.   Yes [provider]  amLODipine (NORVASC) 5 MG tablet Take 5 mg by mouth daily.   Yes [provider]  atorvastatin (LIPITOR) 40 MG tablet Take 40 mg by mouth at bedtime.   Yes [provider]  cetirizine (ZYRTEC) 10 MG tablet Take 10 mg by mouth daily.   Yes [provider]  Cholecalciferol (VITAMIN D3) 250 MCG (10000 UT)  TABS Take 10,000 Units by mouth daily.   Yes [provider]  DHEA 25 MG CAPS Take 25 mg by mouth in the morning.   Yes [provider]  HYDROcodone-acetaminophen (NORCO) 10-325 MG tablet Take 1 tablet by mouth 4 (four) times daily as needed (to control pain). 05/09/22  Yes [provider]  hydrOXYzine (VISTARIL) 25 MG capsule Take 25 mg by mouth 2 (two) times daily as needed for anxiety.   Yes [provider]  INGREZZA 80 MG capsule Take 80 mg by mouth daily. 04/01/22  Yes [provider]  loratadine (CLARITIN) 10 MG tablet Take 10 mg by mouth daily.   Yes [provider]  pantoprazole (PROTONIX) 40 MG tablet Take 1 tablet (40 mg total) by mouth 2 (two) times daily for 14 days. Patient taking differently: Take 40 mg by mouth in the morning and at bedtime. 09/27/22 10/15/22 Yes Lynann Bologna, MD  pregabalin (LYRICA) 75 MG capsule Take 75 mg by mouth in the morning, at noon, in the evening, and at bedtime. 04/07/22  Yes [provider]  QUEtiapine (SEROQUEL) 200 MG tablet Take 200 mg by mouth at bedtime. 05/06/22  Yes [provider]  sertraline (ZOLOFT) 100 MG tablet Take 100 mg by mouth daily.   Yes [provider]  sucralfate (CARAFATE) 1 g tablet Take 1 tablet (1 g total) by mouth 4 (four) times daily. 05/14/22 10/15/22 Yes Ghimire, Lyndel Safe, MD  tiZANidine (ZANAFLEX) 2 MG tablet Take 2 mg by  mouth every 8 (eight) hours as needed for muscle spasms.   Yes [provider]  triamcinolone cream (KENALOG) 0.1 % Apply 1 g topically See admin instructions. Apply (1 grams) green pea-sized bead to popliteal fossae twice a day 04/27/22  Yes [provider]  nicotine (NICODERM CQ - DOSED IN MG/24 HOURS) 21 mg/24hr patch Place 1 patch (21 mg total) onto the skin daily. 05/15/22   Dorcas Carrow, MD  pantoprazole (PROTONIX) 40 MG tablet Take 1 tablet (40 mg total) by mouth 2 (two) times daily. Patient not taking: Reported  on 10/15/2022 05/14/22 10/15/22  Dorcas Carrow, MD      Allergies    Clonazepam, Doxycycline, Fosinopril sodium, Hydrochlorothiazide w-triamterene, Penicillins, Sulfa antibiotics, and Triamterene    Review of Systems   Review of Systems  Respiratory:  Positive for shortness of breath.     Physical Exam Updated Vital Signs BP 124/65   Pulse (!) 103   Temp 98.4 F (36.9 C) (Oral)   Resp 17   SpO2 93%  Physical Exam Constitutional: GCS 15, sitting up in bed actively wheezing but no acute distress Eyes: Conjunctivae are normal. ENT      Head: Normocephalic and atraumatic.      Nose: No congestion.      Mouth/Throat: Mucous membranes are moist.      Neck: No stridor. Cardiovascular: S1, S2, borderline tachycardic, regular rhythm, equal palpable radial and DP pulses Respiratory: Mildly tachypneic, audible wheezing, diffuse coarse breath sounds mixed with faint wheeze, O2 sat 92% on room air Gastrointestinal: Soft and nontender.  Musculoskeletal: Normal range of motion in all extremities. No pitting edema of lower extremities Neurologic: Normal speech and language. No gross focal neurologic deficits are appreciated. Skin: Skin is warm, dry and intact. No rash noted. Psychiatric: Mood and affect are normal. Speech and behavior are normal.  ED Results / Procedures / Treatments   Labs (all labs ordered are listed, but only abnormal results are displayed) Labs Reviewed  COMPREHENSIVE METABOLIC PANEL - Abnormal; Notable for the following components:      Result Value   Glucose, Bld 103 (*)    All other components within normal limits  CBC WITH DIFFERENTIAL/PLATELET - Abnormal; Notable for the following components:   WBC 10.8 (*)    RDW 17.5 (*)    Neutro Abs 7.9 (*)    Monocytes Absolute 1.1 (*)    All other components within normal limits  BLOOD GAS, VENOUS - Abnormal; Notable for the following components:   pO2, Ven 55 (*)    Bicarbonate 32.2 (*)    Acid-Base Excess 6.0 (*)     All other components within normal limits  RESP PANEL BY RT-PCR (RSV, FLU A&B, COVID)  RVPGX2  RESPIRATORY PANEL BY PCR  BRAIN NATRIURETIC PEPTIDE  D-DIMER, QUANTITATIVE  BASIC METABOLIC PANEL  CBC  TROPONIN I (HIGH SENSITIVITY)  TROPONIN I (HIGH SENSITIVITY)    EKG EKG Interpretation  Date/Time:  Friday October 15 2022 14:31:01 EST Ventricular Rate:  101 PR Interval:  148 QRS Duration: 86 QT Interval:  346 QTC Calculation: 449 R Axis:   72 Text Interpretation: Sinus tachycardia Since last tracing rate slower Confirmed by Vivien Rossetti ([id number]) on 10/15/2022 3:33:50 PM  Radiology DG Chest 2 View  Result Date: 10/15/2022 CLINICAL DATA:  Shortness of breath. EXAM: CHEST - 2 VIEW COMPARISON:  AP chest 05/11/2022, 12/29/2020; chest two views 12/02/2019; CT chest 04/30/2020 FINDINGS: Cardiac silhouette is again mildly enlarged. Mediastinal contours are within limits. Mild  bilateral interstitial thickening appears chronic. It is difficult to exclude an element of minimal interstitial pulmonary edema. No pleural effusion or pneumothorax. Mild multilevel degenerative disc changes of the mid to lower thoracic spine with multilevel anterior bridging osteophytes. IMPRESSION: Mild bilateral interstitial thickening appears chronic. It is difficult to exclude an element of minimal superimposed interstitial pulmonary edema. Electronically Signed   By: Neita Garnetonald  Viola M.D.   On: 10/15/2022 16:03    Procedures .Critical Care  Performed by: Mardene SayerBranham, Gillie Fleites C, MD Authorized by: Mardene SayerBranham, Wirt Hemmerich C, MD   Critical care provider statement:    Critical care time (minutes):  35   Critical care was necessary to treat or prevent imminent or life-threatening deterioration of the following conditions:  Respiratory failure   Critical care was time spent personally by me on the following activities:  Development of treatment plan with patient or surrogate, discussions with consultants, evaluation of  patient's response to treatment, examination of patient, ordering and review of laboratory studies, ordering and review of radiographic studies, ordering and performing treatments and interventions, pulse oximetry, re-evaluation of patient's condition, review of old charts and obtaining history from patient or surrogate   Care discussed with: admitting provider       Medications Ordered in ED Medications  amLODipine (NORVASC) tablet 5 mg (has no administration in time range)  atorvastatin (LIPITOR) tablet 40 mg (has no administration in time range)  valbenazine (INGREZZA) capsule 80 mg (has no administration in time range)  pregabalin (LYRICA) capsule 75 mg (75 mg Oral Given 10/15/22 2140)  pantoprazole (PROTONIX) EC tablet 40 mg (has no administration in time range)  sertraline (ZOLOFT) tablet 100 mg (100 mg Oral Given 10/15/22 2140)  QUEtiapine (SEROQUEL) tablet 200 mg (200 mg Oral Given 10/15/22 2140)  sucralfate (CARAFATE) tablet 1 g (1 g Oral Given 10/15/22 2140)  tiZANidine (ZANAFLEX) tablet 2 mg (has no administration in time range)  loratadine (CLARITIN) tablet 10 mg (has no administration in time range)  azithromycin (ZITHROMAX) tablet 500 mg (has no administration in time range)  budesonide (PULMICORT) nebulizer solution 2 mg (2 mg Nebulization Given 10/15/22 2035)  methylPREDNISolone sodium succinate (SOLU-MEDROL) 125 mg/2 mL injection 60 mg (60 mg Intravenous Not Given 10/15/22 1828)  ipratropium-albuterol (DUONEB) 0.5-2.5 (3) MG/3ML nebulizer solution 3 mL (3 mLs Nebulization Given 10/15/22 2035)  albuterol (PROVENTIL) (2.5 MG/3ML) 0.083% nebulizer solution 2.5 mg (has no administration in time range)  enoxaparin (LOVENOX) injection 40 mg (40 mg Subcutaneous Given 10/15/22 2140)  0.45 % sodium chloride infusion ( Intravenous New Bag/Given 10/15/22 2000)  acetaminophen (TYLENOL) tablet 650 mg (has no administration in time range)    Or  acetaminophen (TYLENOL) suppository 650 mg  (has no administration in time range)  ondansetron (ZOFRAN) tablet 4 mg (has no administration in time range)    Or  ondansetron (ZOFRAN) injection 4 mg (has no administration in time range)  hydrOXYzine (ATARAX) tablet 25 mg (has no administration in time range)  ipratropium-albuterol (DUONEB) 0.5-2.5 (3) MG/3ML nebulizer solution 9 mL (9 mLs Nebulization Given 10/15/22 1611)  methylPREDNISolone sodium succinate (SOLU-MEDROL) 125 mg/2 mL injection 125 mg (125 mg Intravenous Given 10/15/22 1621)  lactated ringers bolus 1,000 mL (0 mLs Intravenous Stopped 10/15/22 2018)  azithromycin (ZITHROMAX) 500 mg in sodium chloride 0.9 % 250 mL IVPB (0 mg Intravenous Stopped 10/15/22 2018)    ED Course/ Medical Decision Making/ A&P Clinical Course as of 10/16/22 0011  Fri Oct 15, 2022  1712 After 3 times DuoNebs IV Solu-Medrol and fluids,  patient was ambulated with ED nurse and patient complained of generalized lightheadedness and fatigue and was desatting to 86 to 88% on room air.  Patient's nurse placed patient back on 3 L nasal cannula.  She does note her breathing feels improved.  She has improved aeration.  Due to desaturation and generalized fatigue and weakness and difficulty ambulating will reach out to hospitalist for admission. [VB]    Clinical Course User Index [VB] Mardene Sayer, MD                           Medical Decision Making Yanis Juma is a 71 y.o. female.  Smoker With PMH of paranoid schizophrenia, CAD, asthma/COPD, h/o DVT not on AC who presents from assisted living with shortness of breath.    Patient's presentation concerning for possible COPD exacerbation in the setting of viral URI versus bacterial pneumonia among multiple other etiologies.  She has no pitting edema of the lower extremities and last echo performed 2022 with normal LVEF and RVEF with only grade 1 diastolic dysfunction, no evidence of fluid overload.  Also consider possible PE although less likely with  viral URI-like symptoms and living in assisted living but with history of DVT not on anticoagulation, will obtain D-dimer.  EKG sinus tachycardia with no acute ST/T changes with reassuring high-sensitivity troponin 9, no concern for ACS.  Her D-dimer was negative, no concern for PE.  She had a mild leukocytosis 10.8 with left shift.  COVID RSV and flu negative.  Chest x-ray with increased interstitial infiltrates, favor possible atypical pneumonia.  Less likely pulmonary edema with normal BNP and no peripheral edema or secondary signs of right-sided heart failure.  After 3 times DuoNebs IV Solu-Medrol and fluids, patient was ambulated with ED nurse and patient complained of generalized lightheadedness and fatigue and was desatting to 86 to 88% on room air.  Patient's nurse placed patient back on 3 L nasal cannula.  She does note her breathing feels improved.  She has improved aeration.  Due to desaturation and generalized fatigue and weakness and difficulty ambulating will reach out to hospitalist for admission.  Ordered for IV azithromycin for possible atypical pneumonia coverage.  Patient admitted to hospitalist Dr. Rito Ehrlich.   Amount and/or Complexity of Data Reviewed Labs: ordered. Radiology: ordered.  Risk Prescription drug management. Decision regarding hospitalization.   Final Clinical Impression(s) / ED Diagnoses Final diagnoses:  SOB (shortness of breath)  COPD exacerbation (HCC)    Rx / DC Orders ED Discharge Orders     None         Mardene Sayer, MD 10/16/22 [phone]

## 2022-10-15 NOTE — H&P (Signed)
[facility] History and Physical  Danielle NeedsGwendolyn Lippe UJW:[id number] DOB: 16-Jun-1951 DOA: 10/15/2022   [facility]  Specialists: Dr. Chales AbrahamsGupta is her gastroenterologist  Chief Complaint: Shortness of breath and cough for 2-3 days  HPI: Danielle [name] is a 71 y.o. female with a past medical history of paranoid schizophrenia, asthma, essential hypertension, bipolar disorder who lives in an assisted living facility.  She was in her usual state of health till about 2 to 3 days ago when she started developing a cough and then developed wheezing.  Symptoms worsened.  She uses just albuterol inhaler at home.  She is not on any other current inhalers.  She continues to smoke cigarettes but she mentioned that she smokes only few cigarettes a day.  Denies any chest pain.  No syncopal episodes.  He has had nausea and has vomited 2-3 times over the last 3 days.  Denies any abdominal pain.  Denies any fever or chills.  There is no sputum production with her cough.  In the emergency department she was noted to be wheezing.  Saturations dropped to 88% on room air.  She was placed on oxygen.  She was given breathing treatments without much improvement.  She will be hospitalized for further management.  Home Medications: This list is not reconciled yet.  Patient did have a list of her prior to admission medications by her bedside which was used to write the orders. Prior to Admission medications   Medication Sig Start Date End Date Taking? Authorizing Provider  albuterol (PROVENTIL HFA;VENTOLIN HFA) 108 (90 Base) MCG/ACT inhaler Inhale 1-2 puffs into the lungs every 6 (six) hours as needed for wheezing or shortness of breath (or cough). Patient taking differently: Inhale 1-2 puffs into the lungs every 4 (four) hours as needed for wheezing or shortness of breath (or cough). 12/31/18   Ronnie DossMcLean, Kelly A, PA-C  albuterol (PROVENTIL) (2.5 MG/3ML) 0.083% nebulizer solution Take 2.5 mg by nebulization  every 6 (six) hours as needed for wheezing or shortness of breath.    [provider]  amLODipine (NORVASC) 5 MG tablet Take 5 mg by mouth daily.    [provider]  atorvastatin (LIPITOR) 40 MG tablet Take 40 mg by mouth daily.    [provider]  cetirizine (ZYRTEC) 10 MG tablet Take 10 mg by mouth daily.    [provider]  Cholecalciferol (VITAMIN D3) 250 MCG (10000 UT) TABS Take 10,000 Units by mouth daily.    [provider]  DHEA 25 MG CAPS Take 25 mg by mouth daily.    [provider]  HYDROcodone-acetaminophen (NORCO) 10-325 MG tablet Take 1 tablet by mouth daily as needed. 05/09/22   [provider]  hydrOXYzine (VISTARIL) 25 MG capsule Take 25 mg by mouth 2 (two) times daily as needed for anxiety.    [provider]  INGREZZA 80 MG capsule Take 80 mg by mouth daily. 04/01/22   [provider]  loratadine (CLARITIN) 10 MG tablet Take 10 mg by mouth daily.    [provider]  nicotine (NICODERM CQ - DOSED IN MG/24 HOURS) 21 mg/24hr patch Place 1 patch (21 mg total) onto the skin daily. 05/15/22   Dorcas CarrowGhimire, Kuber, MD  pantoprazole (PROTONIX) 40 MG tablet Take 1 tablet (40 mg total) by mouth 2 (two) times daily. 05/14/22 08/25/22  Dorcas CarrowGhimire, Kuber, MD  pantoprazole (PROTONIX) 40 MG tablet Take 1 tablet (40 mg total) by mouth 2 (two) times daily for 14 days. 09/27/22 10/11/22  Lynann Bologna, MD  pregabalin (LYRICA) 75 MG capsule Take 75 mg by mouth in the morning, at noon, in the evening, and at bedtime. 04/07/22   [provider]  QUEtiapine (SEROQUEL) 200 MG tablet Take 200 mg by mouth at bedtime. 05/06/22   [provider]  sertraline (ZOLOFT) 100 MG tablet Take 100 mg by mouth at bedtime.    [provider]  sucralfate (CARAFATE) 1 g tablet Take 1 tablet (1 g total) by mouth 4 (four) times daily. 05/14/22 08/25/22  Dorcas Carrow, MD  tiZANidine (ZANAFLEX) 2 MG tablet Take 2 mg by mouth  every 8 (eight) hours as needed for muscle spasms.    [provider]  triamcinolone cream (KENALOG) 0.1 % Apply 1 g topically 2 (two) times daily. Apply 1 gram topically. Apply green pea sized bead to popliteal fossae twice a day. 04/27/22   [provider]    Allergies:  Allergies  Allergen Reactions   Clonazepam Swelling    Pt was recently given a triamterene hydrochlorothiazide combination as well as clonazepam and had an allergic reaction to one of them. Caused swelling of face and eyes   Doxycycline Swelling    Swelling of face and eyes   Fosinopril Sodium Swelling     swelling face,lips-angioedema from ACE I   Hydrochlorothiazide W-Triamterene Swelling    Pt was recently given a triamterene hydrochlorothiazide combination as well as clonazepam and had an allergic reaction to one of them. Swelling of face and eyes   Penicillins Swelling    Face and eyes Has patient had a PCN reaction causing immediate rash, facial/tongue/throat swelling, SOB or lightheadedness with hypotension: Yes Has patient had a PCN reaction causing severe rash involving mucus membranes or skin necrosis: No Has patient had a PCN reaction that required hospitalization: No Has patient had a PCN reaction occurring within the last 10 years: No If all of the above answers are "NO", then may proceed with Cephalosporin use.    Sulfa Antibiotics Other (See Comments)    Unknown reaction    Past Medical History: Past Medical History:  Diagnosis Date   Asthma    Bipolar affective disorder, currently depressed, mild (HCC)    Chest pain    Complication of anesthesia    " My eyes swell up "   Coronary artery disease    Depression    Fall 08/25/2012   Recent fall with residual knee and back pain.   Family history of anesthesia complication    " [name] "   GERD (gastroesophageal reflux disease)    Hypertension    Memory disturbance 01/30/2016   Stroke (HCC)    Syncope and collapse  03/21/2019   Tardive dyskinesia    Tobacco abuse     Past Surgical History:  Procedure Laterality Date   ABDOMINAL HYSTERECTOMY     BIOPSY  05/11/2022   Procedure: BIOPSY;  Surgeon: Lynann Bologna, MD;  Location: [facility] ENDOSCOPY;  Service: Gastroenterology;;   CARDIAC CATHETERIZATION     ESOPHAGOGASTRODUODENOSCOPY (EGD) WITH PROPOFOL N/A 05/11/2022   Procedure: ESOPHAGOGASTRODUODENOSCOPY (EGD) WITH PROPOFOL;  Surgeon: Lynann Bologna, MD;  Location: [facility] ENDOSCOPY;  Service: Gastroenterology;  Laterality: N/A;   HEMOSTASIS CONTROL  05/11/2022   Procedure: HEMOSTASIS CONTROL;  Surgeon: Lynann Bologna, MD;  Location: [facility] ENDOSCOPY;  Service: Gastroenterology;;   HOT HEMOSTASIS N/A 05/11/2022   Procedure: HOT HEMOSTASIS (ARGON PLASMA COAGULATION/BICAP);  Surgeon: Lynann Bologna, MD;  Location: [facility] ENDOSCOPY;  Service: Gastroenterology;  Laterality: N/A;   RIGHT/LEFT  HEART CATH AND CORONARY ANGIOGRAPHY N/A 08/14/2018   Procedure: RIGHT/LEFT HEART CATH AND CORONARY ANGIOGRAPHY;  Surgeon: Marykay Lex, MD;  Location: [facility] INVASIVE CV LAB;  Service: Cardiovascular;  Laterality: N/A;    Social History: Lives in an assisted living facility.  Smokes few cigarettes on a daily basis.  Denies any alcohol use.  No illicit drug use.  Family History:  Family History  Problem Relation Age of Onset   Hypertension Mother    Stroke Mother    Heart attack Mother    Heart attack Father    Heart attack Sister    Heart attack Brother    Dementia Neg Hx    Colon cancer Neg Hx    Esophageal cancer Neg Hx    Stomach cancer Neg Hx      Review of Systems - History obtained from the patient General ROS: positive for  - fatigue Psychological ROS: negative Ophthalmic ROS: negative ENT ROS: negative Allergy and Immunology ROS: negative Hematological and Lymphatic ROS: negative Endocrine ROS: negative Respiratory ROS: As in HPI Cardiovascular ROS: As in HPI Gastrointestinal ROS: positive for -  nausea/vomiting Genito-Urinary ROS: no dysuria, trouble voiding, or hematuria Musculoskeletal ROS: negative Neurological ROS: no TIA or stroke symptoms Dermatological ROS: negative  Physical Examination  Vitals:   10/15/22 1431 10/15/22 1620 10/15/22 1700 10/15/22 1742  BP: 121/67 132/79 132/69 129/89  Pulse: 99 91 86 80  Resp: (!) 22 (!) 21 20 (!) 22  Temp: 98.9 F (37.2 C)     TempSrc: Oral     SpO2: 99% 97% 95% 95%    BP 129/89   Pulse 80   Temp 98.9 F (37.2 C) (Oral)   Resp (!) 22   SpO2 95%   General appearance: alert, cooperative, appears stated age, and no distress Head: Normocephalic, without obvious abnormality, atraumatic Eyes: conjunctivae/corneas clear. PERRL, EOM's intact. Throat: lips, mucosa, and tongue normal; teeth and gums normal Neck: no adenopathy, no carotid bruit, no JVD, supple, symmetrical, trachea midline, and thyroid not enlarged, symmetric, no tenderness/mass/nodules Back: symmetric, no curvature. ROM normal. No CVA tenderness. Resp: Mildly tachypneic at rest.  Coarse breath sounds bilaterally with wheezing heard bilaterally.  Few crackles at the bases.  No rhonchi. Cardio: regular rate and rhythm, S1, S2 normal, no murmur, click, rub or gallop GI: soft, non-tender; bowel sounds normal; no masses,  no organomegaly Extremities: extremities normal, atraumatic, no cyanosis or edema Pulses: 2+ and symmetric Skin: Skin color, texture, turgor normal. No rashes or lesions Lymph nodes: Cervical, supraclavicular, and axillary nodes normal. Neurologic: Alert and oriented x 3.  No obvious focal neurological deficits.   Labs on Admission: I have personally reviewed following labs and imaging studies  CBC: Recent Labs  Lab 10/15/22 1610  WBC 10.8*  NEUTROABS 7.9*  HGB 13.5  HCT 41.3  MCV 88.2  PLT 272   Basic Metabolic Panel: Recent Labs  Lab 10/15/22 1610  NA 141  K 3.6  CL 106  CO2 28  GLUCOSE 103*  BUN 15  CREATININE 0.80  CALCIUM  10.0   GFR: CrCl cannot be calculated (Unknown ideal weight.). Liver Function Tests: Recent Labs  Lab 10/15/22 1610  AST 21  ALT 16  ALKPHOS 65  BILITOT 0.7  PROT 7.9  ALBUMIN 4.3     Radiological Exams on Admission: DG Chest 2 View  Result Date: 10/15/2022 CLINICAL DATA:  Shortness of breath. EXAM: CHEST - 2 VIEW COMPARISON:  AP chest 05/11/2022, 12/29/2020; chest two views  12/02/2019; CT chest 04/30/2020 FINDINGS: Cardiac silhouette is again mildly enlarged. Mediastinal contours are within limits. Mild bilateral interstitial thickening appears chronic. It is difficult to exclude an element of minimal interstitial pulmonary edema. No pleural effusion or pneumothorax. Mild multilevel degenerative disc changes of the mid to lower thoracic spine with multilevel anterior bridging osteophytes. IMPRESSION: Mild bilateral interstitial thickening appears chronic. It is difficult to exclude an element of minimal superimposed interstitial pulmonary edema. Electronically Signed   By: Neita Garnet M.D.   On: 10/15/2022 16:03    My interpretation of Electrocardiogram:  sinus tachycardia 101 bpm.  Normal axis.  Intervals appear to be normal.  No concerning ST segment changes.  No concerning T wave changes.   Problem List  Principal Problem:   COPD with acute exacerbation (HCC) Active Problems:   Schizophrenia, paranoid type (HCC)   Acute respiratory failure with hypoxia (HCC)   Assessment: This is a 71 year old African-American female who lives in an assisted living facility who comes in with 2 to 3-day history of worsening shortness of breath and cough.  She is noted to be wheezing.  She appears to have COPD with acute exacerbation.  Influenza, RSV and COVID-19 PCR's were negative.  Chest x-ray does not show any clear infiltrate.  Concern was raised for interstitial edema but thought to be unlikely.  BNP is normal.  Plan:  #1. COPD with acute exacerbation: She was counseled to stop  smoking cigarettes.  She will be given nebulizer treatment and systemic steroids inhaled steroids.  Continue with azithromycin.  BNP was noted to be normal.  Will check respiratory viral panel.  #2. Acute respiratory failure with hypoxia: Continue oxygen by nasal cannula.  Does not use oxygen at home.  Should be able to wean her off of oxygen once she has improved from her COPD exacerbation.  #3. Nausea and vomiting: Probably triggered by a respiratory illness.  Abdomen is benign.  LFTs are normal.  Continue to monitor.  #4. Essential hypertension: Continue with amlodipine.  #5. History of Psychiatric disorders: She is noted to be on multiple psychotropic agents which will be continued.  DVT Prophylaxis: Lovenox Code Status: Full code Family Communication: Discussed with the patient Disposition: Hopefully return to ALF when improved Consults called: None Admission Status: Status is: Observation The patient remains OBS appropriate and will d/c before 2 midnights.    Severity of Illness: The appropriate patient status for this patient is OBSERVATION. Observation status is judged to be reasonable and necessary in order to provide the required intensity of service to ensure the patient's safety. The patient's presenting symptoms, physical exam findings, and initial radiographic and laboratory data in the context of their medical condition is felt to place them at decreased risk for further clinical deterioration. Furthermore, it is anticipated that the patient will be medically stable for discharge from the hospital within 2 midnights of admission.    Further management decisions will depend on results of further testing and patient's response to treatment.   Krystyn Picking [facility]  [facility] on [email or web address]  10/15/2022, 5:49 PM

## 2022-10-15 NOTE — ED Notes (Signed)
Pt oxygen dropped to 88% RA. Pt placed on 3L  O2. Currently 96%. Dr. Elpidio Anis aware.

## 2022-10-15 NOTE — ED Triage Notes (Signed)
Pt arrived via EMS, from assisted living. C/o SOB for several days. Hx of COPD. Aox4. 98% RA

## 2022-10-16 DIAGNOSIS — J9601 Acute respiratory failure with hypoxia: Secondary | ICD-10-CM

## 2022-10-16 DIAGNOSIS — Z888 Allergy status to other drugs, medicaments and biological substances status: Secondary | ICD-10-CM | POA: Diagnosis not present

## 2022-10-16 DIAGNOSIS — Z8673 Personal history of transient ischemic attack (TIA), and cerebral infarction without residual deficits: Secondary | ICD-10-CM | POA: Diagnosis not present

## 2022-10-16 DIAGNOSIS — F17210 Nicotine dependence, cigarettes, uncomplicated: Secondary | ICD-10-CM | POA: Diagnosis present

## 2022-10-16 DIAGNOSIS — Z880 Allergy status to penicillin: Secondary | ICD-10-CM | POA: Diagnosis not present

## 2022-10-16 DIAGNOSIS — Z8249 Family history of ischemic heart disease and other diseases of the circulatory system: Secondary | ICD-10-CM | POA: Diagnosis not present

## 2022-10-16 DIAGNOSIS — Z823 Family history of stroke: Secondary | ICD-10-CM | POA: Diagnosis not present

## 2022-10-16 DIAGNOSIS — I2510 Atherosclerotic heart disease of native coronary artery without angina pectoris: Secondary | ICD-10-CM | POA: Diagnosis present

## 2022-10-16 DIAGNOSIS — Z90710 Acquired absence of both cervix and uterus: Secondary | ICD-10-CM | POA: Diagnosis not present

## 2022-10-16 DIAGNOSIS — Z1152 Encounter for screening for COVID-19: Secondary | ICD-10-CM | POA: Diagnosis not present

## 2022-10-16 DIAGNOSIS — R0602 Shortness of breath: Secondary | ICD-10-CM | POA: Diagnosis present

## 2022-10-16 DIAGNOSIS — E785 Hyperlipidemia, unspecified: Secondary | ICD-10-CM | POA: Diagnosis present

## 2022-10-16 DIAGNOSIS — I10 Essential (primary) hypertension: Secondary | ICD-10-CM | POA: Diagnosis present

## 2022-10-16 DIAGNOSIS — E876 Hypokalemia: Secondary | ICD-10-CM | POA: Diagnosis present

## 2022-10-16 DIAGNOSIS — Z882 Allergy status to sulfonamides status: Secondary | ICD-10-CM | POA: Diagnosis not present

## 2022-10-16 DIAGNOSIS — J441 Chronic obstructive pulmonary disease with (acute) exacerbation: Secondary | ICD-10-CM | POA: Diagnosis present

## 2022-10-16 DIAGNOSIS — F200 Paranoid schizophrenia: Secondary | ICD-10-CM | POA: Diagnosis present

## 2022-10-16 DIAGNOSIS — B9789 Other viral agents as the cause of diseases classified elsewhere: Secondary | ICD-10-CM | POA: Diagnosis present

## 2022-10-16 DIAGNOSIS — Z86718 Personal history of other venous thrombosis and embolism: Secondary | ICD-10-CM | POA: Diagnosis not present

## 2022-10-16 LAB — RESPIRATORY PANEL BY PCR

## 2022-10-16 LAB — CBC
HCT: 37.5 % (ref 36.0–46.0)
Hemoglobin: 12.2 g/dL (ref 12.0–15.0)
MCH: 28.8 pg (ref 26.0–34.0)
MCHC: 32.5 g/dL (ref 30.0–36.0)
MCV: 88.7 fL (ref 80.0–100.0)
Platelets: 247 10*3/uL (ref 150–400)
RBC: 4.23 MIL/uL (ref 3.87–5.11)
RDW: 17.3 % — ABNORMAL HIGH (ref 11.5–15.5)
WBC: 6.8 10*3/uL (ref 4.0–10.5)
nRBC: 0 % (ref 0.0–0.2)

## 2022-10-16 LAB — BASIC METABOLIC PANEL
Anion gap: 8 (ref 5–15)
BUN: 16 mg/dL (ref 8–23)
CO2: 21 mmol/L — ABNORMAL LOW (ref 22–32)
Calcium: 8.7 mg/dL — ABNORMAL LOW (ref 8.9–10.3)
Chloride: 110 mmol/L (ref 98–111)
Creatinine, Ser: 0.59 mg/dL (ref 0.44–1.00)
GFR, Estimated: >60 mL/min (ref >60)
Glucose, Bld: 152 mg/dL — ABNORMAL HIGH (ref 70–99)
Potassium: 3 mmol/L — ABNORMAL LOW (ref 3.5–5.1)
Sodium: 139 mmol/L (ref 135–145)

## 2022-10-16 MED ORDER — OXYCODONE HCL 5 MG PO TABS
5.0000 mg | ORAL_TABLET | Freq: Four times a day (QID) | ORAL | Status: DC | PRN
  Administered 2022-10-16 – 2022-10-19 (×5): 5 mg via ORAL
  Filled 2022-10-16 (×5): qty 1

## 2022-10-16 NOTE — Progress Notes (Signed)
PROGRESS NOTE    Danielle [name]  LNL:[id number] DOB: May 11, 1951 DOA: 10/15/2022 [facility]: [facility]   Brief Narrative:  71 y.o. female with a past medical history of paranoid schizophrenia, asthma, essential hypertension, bipolar disorder who lives in an assisted living facility presented with shortness of breath and cough and was found to be saturating in the 80s on room air on presentation.  Influenza, RSV and COVID-19 PCR negative.  Chest x-ray did not show any infiltrate.  She was started on IV steroids and nebulizers and inhaled steroids.  Assessment & Plan:   COPD exacerbation Acute respiratory failure with hypoxia Tobacco abuse, ongoing -Still on 3 L oxygen by nasal cannula.  Wean off as able. -Continue IV Solu-Medrol.  Continue current nebs.  Patient was counseled regarding tobacco cessation -Will need outpatient pulmonary evaluation and follow-up  Nausea and vomiting -Possibly from above, triggered by respiratory illness.  Abdominal exam is benign and LFTs were normal on presentation. -Continue supportive care.  Advance diet as tolerated  Essential hypertension -Blood pressure stable.  Continue amlodipine  History of psychiatric disorders -Her home regimen consisting of multiple psychotropic agents have been continued.  Outpatient follow-up with psychiatry.  Hyperlipidemia -Continue statin    DVT prophylaxis: Lovenox Code Status: Full Family Communication: None at bedside Disposition Plan: Status is: Observation The patient will require care spanning > 2 midnights and should be moved to inpatient because: Of need for IV steroids   Consultants: None  Procedures: None  Antimicrobials: None   Subjective: Patient seen and examined at bedside.  Still short of breath with minimal exertion and complains of intermittent cough.  Does not really feel ready to go home today.  No fever, vomiting, chest pain reported.  Objective: Vitals:   10/16/22 0100  10/16/22 0332 10/16/22 0400 10/16/22 0600  BP: 118/86  122/70 (!) 138/94  Pulse: 88  83 82  Resp: (!) 27  14 15   Temp:  97.8 F (36.6 C)    TempSrc:  Oral    SpO2: 94%  93% 94%    Intake/Output Summary (Last 24 hours) at 10/16/2022 0730 Last data filed at 10/15/2022 2018 Gross per 24 hour  Intake 1250 ml  Output --  Net 1250 ml   There were no vitals filed for this visit.  Examination:  General exam: Appears calm and comfortable.  On 3 L oxygen via nasal cannula. Respiratory system: Bilateral decreased breath sounds at bases with scattered wheezing Cardiovascular system: S1 & S2 heard, Rate controlled Gastrointestinal system: Abdomen is nondistended, soft and nontender. Normal bowel sounds heard. Extremities: No cyanosis, clubbing, edema  Central nervous system: Alert and oriented. No focal neurological deficits. Moving extremities Skin: No rashes, lesions or ulcers Psychiatry: Flat affect.  No signs of agitation.   Data Reviewed: I have personally reviewed following labs and imaging studies  CBC: Recent Labs  Lab 10/15/22 1610 10/16/22 0351  WBC 10.8* 6.8  NEUTROABS 7.9*  --   HGB 13.5 12.2  HCT 41.3 37.5  MCV 88.2 88.7  PLT 272 247   Basic Metabolic Panel: Recent Labs  Lab 10/15/22 1610 10/16/22 0351  NA 141 139  K 3.6 3.0*  CL 106 110  CO2 28 21*  GLUCOSE 103* 152*  BUN 15 16  CREATININE 0.80 0.59  CALCIUM 10.0 8.7*   GFR: CrCl cannot be calculated (Unknown ideal weight.). Liver Function Tests: Recent Labs  Lab 10/15/22 1610  AST 21  ALT 16  ALKPHOS 65  BILITOT 0.7  PROT  7.9  ALBUMIN 4.3   No results for input(s): "LIPASE", "AMYLASE" in the last 168 hours. No results for input(s): "AMMONIA" in the last 168 hours. Coagulation Profile: No results for input(s): "INR", "PROTIME" in the last 168 hours. Cardiac Enzymes: No results for input(s): "CKTOTAL", "CKMB", "CKMBINDEX", "TROPONINI" in the last 168 hours. BNP (last 3 results) No results  for input(s): "PROBNP" in the last 8760 hours. HbA1C: No results for input(s): "HGBA1C" in the last 72 hours. CBG: No results for input(s): "GLUCAP" in the last 168 hours. Lipid Profile: No results for input(s): "CHOL", "HDL", "LDLCALC", "TRIG", "CHOLHDL", "LDLDIRECT" in the last 72 hours. Thyroid Function Tests: No results for input(s): "TSH", "T4TOTAL", "FREET4", "T3FREE", "THYROIDAB" in the last 72 hours. Anemia Panel: No results for input(s): "VITAMINB12", "FOLATE", "FERRITIN", "TIBC", "IRON", "RETICCTPCT" in the last 72 hours. Sepsis Labs: No results for input(s): "PROCALCITON", "LATICACIDVEN" in the last 168 hours.  Recent Results (from the past 240 hour(s))  Resp panel by RT-PCR (RSV, Flu A&B, Covid) Anterior Nasal Swab     Status: None   Collection Time: 10/15/22  4:10 PM   Specimen: Anterior Nasal Swab  Result Value Ref Range Status   SARS Coronavirus 2 by RT PCR NEGATIVE NEGATIVE Final    Comment: (NOTE) SARS-CoV-2 target nucleic acids are NOT DETECTED.  The SARS-CoV-2 RNA is generally detectable in upper respiratory specimens during the acute phase of infection. The lowest concentration of SARS-CoV-2 viral copies this assay can detect is 138 copies/mL. A negative result does not preclude SARS-Cov-2 infection and should not be used as the sole basis for treatment or other patient management decisions. A negative result may occur with  improper specimen collection/handling, submission of specimen other than nasopharyngeal swab, presence of viral mutation(s) within the areas targeted by this assay, and inadequate number of viral copies(<138 copies/mL). A negative result must be combined with clinical observations, patient history, and epidemiological information. The expected result is Negative.  Fact Sheet for Patients:  BloggerCourse.com  Fact Sheet for Healthcare Providers:  SeriousBroker.it  This test is no t yet  approved or cleared by the [place] FDA and  has been authorized for detection and/or diagnosis of SARS-CoV-2 by FDA under an Emergency Use Authorization (EUA). This EUA will remain  in effect (meaning this test can be used) for the duration of the COVID-19 declaration under Section 564(b)(1) of the Act, 21 U.S.C.section 360bbb-3(b)(1), unless the authorization is terminated  or revoked sooner.       Influenza A by PCR NEGATIVE NEGATIVE Final   Influenza B by PCR NEGATIVE NEGATIVE Final    Comment: (NOTE) The Xpert Xpress SARS-CoV-2/FLU/RSV plus assay is intended as an aid in the diagnosis of influenza from Nasopharyngeal swab specimens and should not be used as a sole basis for treatment. Nasal washings and aspirates are unacceptable for Xpert Xpress SARS-CoV-2/FLU/RSV testing.  Fact Sheet for Patients: BloggerCourse.com  Fact Sheet for Healthcare Providers: SeriousBroker.it  This test is not yet approved or cleared by the [place] FDA and has been authorized for detection and/or diagnosis of SARS-CoV-2 by FDA under an Emergency Use Authorization (EUA). This EUA will remain in effect (meaning this test can be used) for the duration of the COVID-19 declaration under Section 564(b)(1) of the Act, 21 U.S.C. section 360bbb-3(b)(1), unless the authorization is terminated or revoked.     Resp Syncytial Virus by PCR NEGATIVE NEGATIVE Final    Comment: (NOTE) Fact Sheet for Patients: BloggerCourse.com  Fact Sheet for Healthcare Providers:  SeriousBroker.it  This test is not yet approved or cleared by the [place] and has been authorized for detection and/or diagnosis of SARS-CoV-2 by FDA under an Emergency Use Authorization (EUA). This EUA will remain in effect (meaning this test can be used) for the duration of the COVID-19 declaration under Section 564(b)(1) of  the Act, 21 U.S.C. section 360bbb-3(b)(1), unless the authorization is terminated or revoked.  Performed at [facility], [address] [place], [place] [address]          Radiology Studies: DG Chest 2 View  Result Date: 10/15/2022 CLINICAL DATA:  Shortness of breath. EXAM: CHEST - 2 VIEW COMPARISON:  AP chest 05/11/2022, 12/29/2020; chest two views 12/02/2019; CT chest 04/30/2020 FINDINGS: Cardiac silhouette is again mildly enlarged. Mediastinal contours are within limits. Mild bilateral interstitial thickening appears chronic. It is difficult to exclude an element of minimal interstitial pulmonary edema. No pleural effusion or pneumothorax. Mild multilevel degenerative disc changes of the mid to lower thoracic spine with multilevel anterior bridging osteophytes. IMPRESSION: Mild bilateral interstitial thickening appears chronic. It is difficult to exclude an element of minimal superimposed interstitial pulmonary edema. Electronically Signed   By: Neita Garnet M.D.   On: 10/15/2022 16:03        Scheduled Meds:  amLODipine  5 mg Oral Daily   atorvastatin  40 mg Oral Daily   azithromycin  500 mg Oral Daily   budesonide (PULMICORT) nebulizer solution  2 mg Nebulization Q12H   enoxaparin (LOVENOX) injection  40 mg Subcutaneous Q24H   ipratropium-albuterol  3 mL Nebulization Q6H   loratadine  10 mg Oral Daily   methylPREDNISolone (SOLU-MEDROL) injection  60 mg Intravenous Q12H   pantoprazole  40 mg Oral Daily   pregabalin  75 mg Oral TID   QUEtiapine  200 mg Oral QHS   sertraline  100 mg Oral QHS   sucralfate  1 g Oral QID   valbenazine  80 mg Oral Daily   Continuous Infusions:        Glade Lloyd, MD [facility] 10/16/2022, 7:30 AM

## 2022-10-16 NOTE — ED Notes (Signed)
ED TO INPATIENT HANDOFF REPORT  ED Nurse Name and Phone #: Baxter Flattery, RN  S Name/Age/Gender Danielle [name] 71 y.o. female Room/Bed: WA03/WA03  Code Status   Code Status: Full Code  Home/SNF/Other Home Patient oriented to: self, place, time, and situation Is this baseline? Yes   Triage Complete: Triage complete  Chief Complaint COPD with acute exacerbation [facility]) [J44.1]  Triage Note Pt arrived via EMS, from assisted living. C/o SOB for several days. Hx of COPD. Aox4. 98% RA   Allergies Allergies  Allergen Reactions   Clonazepam Swelling and Other (See Comments)    Pt was recently given a triamterene hydrochlorothiazide combination as well as clonazepam and had an allergic reaction to one of them. Caused swelling of face and eyes   Doxycycline Swelling and Other (See Comments)    Swelling of face and eyes   Fosinopril Sodium Swelling and Other (See Comments)    Swelling face,lips-angioedema from ACE I   Hydrochlorothiazide W-Triamterene Swelling    Pt was recently given a triamterene hydrochlorothiazide combination as well as clonazepam and had an allergic reaction to one of them. Swelling of face and eyes   Penicillins Swelling and Other (See Comments)    Face and eyes Has patient had a PCN reaction causing immediate rash, facial/tongue/throat swelling, SOB or lightheadedness with hypotension: Yes Has patient had a PCN reaction causing severe rash involving mucus membranes or skin necrosis: No Has patient had a PCN reaction that required hospitalization: No Has patient had a PCN reaction occurring within the last 10 years: No If all of the above answers are "NO", then may proceed with Cephalosporin use.    Sulfa Antibiotics Other (See Comments)    "Allergic," per MAR   Triamterene Other (See Comments)    Pt was recently given a triamterene hydrochlorothiazide combination as well as clonazepam and had an allergic reaction to one of them. "Allergic," per MAR    Level  of Care/Admitting Diagnosis ED Disposition     ED Disposition  Admit   Condition  --   [place]:  [id number]  Level of Care: Med-Surg [16]  May place patient in observation at [facility] or [facility] if equivalent level of care is available:: No  Covid Evaluation: Confirmed COVID Negative  Diagnosis: COPD with acute exacerbation [id number]  Admitting Physician: Bonnielee Haff [[id number]]  Attending Physician: Bonnielee Haff [[id number]]          B Medical/Surgery History Past Medical History:  Diagnosis Date   Asthma    Bipolar affective disorder, currently depressed, mild (HCC)    Chest pain    Complication of anesthesia    " My eyes swell up "   Coronary artery disease    Depression    Fall 08/25/2012   Recent fall with residual knee and back pain.   Family history of anesthesia complication    " [name] "   GERD (gastroesophageal reflux disease)    Hypertension    Memory disturbance 01/30/2016   Stroke ([place])    Syncope and collapse 03/21/2019   Tardive dyskinesia    Tobacco abuse    Past Surgical History:  Procedure Laterality Date   ABDOMINAL HYSTERECTOMY     BIOPSY  05/11/2022   Procedure: BIOPSY;  Surgeon: Jackquline Denmark, MD;  Location: [facility] ENDOSCOPY;  Service: Gastroenterology;;   CARDIAC CATHETERIZATION     ESOPHAGOGASTRODUODENOSCOPY (EGD) WITH PROPOFOL N/A 05/11/2022   Procedure: ESOPHAGOGASTRODUODENOSCOPY (EGD) WITH PROPOFOL;  Surgeon: Jackquline Denmark,  MD;  Location: [place];  Service: Gastroenterology;  Laterality: N/A;   HEMOSTASIS CONTROL  05/11/2022   Procedure: HEMOSTASIS CONTROL;  Surgeon: Jackquline Denmark, MD;  Location: [facility] ENDOSCOPY;  Service: Gastroenterology;;   HOT HEMOSTASIS N/A 05/11/2022   Procedure: HOT HEMOSTASIS (ARGON PLASMA COAGULATION/BICAP);  Surgeon: Jackquline Denmark, MD;  Location: [facility] ENDOSCOPY;  Service: Gastroenterology;  Laterality: N/A;   RIGHT/LEFT HEART CATH AND CORONARY ANGIOGRAPHY N/A  08/14/2018   Procedure: RIGHT/LEFT HEART CATH AND CORONARY ANGIOGRAPHY;  Surgeon: Leonie Man, MD;  Location: [place] CV LAB;  Service: Cardiovascular;  Laterality: N/A;     A IV Location/Drains/Wounds Patient Lines/Drains/Airways Status     Active Line/Drains/Airways     Name Placement date Placement time Site Days   Peripheral IV 10/15/22 22 G Right Forearm 10/15/22  --  Forearm  1   External Urinary Catheter 05/12/22  0436  --  157            Intake/Output Last 24 hours  Intake/Output Summary (Last 24 hours) at 10/16/2022 1138 Last data filed at 10/15/2022 2018 Gross per 24 hour  Intake 1250 ml  Output --  Net 1250 ml    Labs/Imaging Results for orders placed or performed during the hospital encounter of 10/15/22 (from the past 48 hour(s))  Comprehensive metabolic panel     Status: Abnormal   Collection Time: 10/15/22  4:10 PM  Result Value Ref Range   Sodium 141 135 - 145 mmol/L   Potassium 3.6 3.5 - 5.1 mmol/L   Chloride 106 98 - 111 mmol/L   CO2 28 22 - 32 mmol/L   Glucose, Bld 103 (H) 70 - 99 mg/dL    Comment: Glucose reference range applies only to samples taken after fasting for at least 8 hours.   BUN 15 8 - 23 mg/dL   Creatinine, Ser 0.80 0.44 - 1.00 mg/dL   Calcium 10.0 8.9 - 10.3 mg/dL   Total Protein 7.9 6.5 - 8.1 g/dL   Albumin 4.3 3.5 - 5.0 g/dL   AST 21 15 - 41 U/L   ALT 16 0 - 44 U/L   Alkaline Phosphatase 65 38 - 126 U/L   Total Bilirubin 0.7 0.3 - 1.2 mg/dL   GFR, Estimated >60 >60 mL/min    Comment: (NOTE) Calculated using the CKD-EPI Creatinine Equation (2021)    Anion gap 7 5 - 15    Comment: Performed at [facility], [place] [address] [place], [place] [address]  Brain natriuretic peptide     Status: None   Collection Time: 10/15/22  4:10 PM  Result Value Ref Range   B Natriuretic Peptide 48.2 0.0 - 100.0 pg/mL    Comment: Performed at [facility], [place] [address] [place], [place]  [address]  CBC with Differential     Status: Abnormal   Collection Time: 10/15/22  4:10 PM  Result Value Ref Range   WBC 10.8 (H) 4.0 - 10.5 K/uL   RBC 4.68 3.87 - 5.11 MIL/uL   Hemoglobin 13.5 12.0 - 15.0 g/dL   HCT 41.3 36.0 - 46.0 %   MCV 88.2 80.0 - 100.0 fL   MCH 28.8 26.0 - 34.0 pg   MCHC 32.7 30.0 - 36.0 g/dL   RDW 17.5 (H) 11.5 - 15.5 %   Platelets 272 150 - 400 K/uL   nRBC 0.0 0.0 - 0.2 %   Neutrophils Relative % 73 %   Neutro Abs 7.9 (H) 1.7 - 7.7 K/uL  Lymphocytes Relative 15 %   Lymphs Abs 1.6 0.7 - 4.0 K/uL   Monocytes Relative 10 %   Monocytes Absolute 1.1 (H) 0.1 - 1.0 K/uL   Eosinophils Relative 1 %   Eosinophils Absolute 0.1 0.0 - 0.5 K/uL   Basophils Relative 0 %   Basophils Absolute 0.0 0.0 - 0.1 K/uL   Immature Granulocytes 1 %   Abs Immature Granulocytes 0.06 0.00 - 0.07 K/uL    Comment: Performed at [facility], [place] [address], [place], [place] [address]  Troponin I (High Sensitivity)     Status: None   Collection Time: 10/15/22  4:10 PM  Result Value Ref Range   Troponin I (High Sensitivity) 9 <18 ng/L    Comment: (NOTE) Elevated high sensitivity troponin I (hsTnI) values and significant  changes across serial measurements may suggest ACS but many other  chronic and acute conditions are known to elevate hsTnI results.  Refer to the "Links" section for chest pain algorithms and additional  guidance. Performed at [facility], [place] [address], [place], [place] [address]   Resp panel by RT-PCR (RSV, Flu A&B, Covid) Anterior Nasal Swab     Status: None   Collection Time: 10/15/22  4:10 PM   Specimen: Anterior Nasal Swab  Result Value Ref Range   SARS Coronavirus 2 by RT PCR NEGATIVE NEGATIVE    Comment: (NOTE) SARS-CoV-2 target nucleic acids are NOT DETECTED.  The SARS-CoV-2 RNA is generally detectable in upper respiratory specimens during the acute phase of infection. The lowest concentration of SARS-CoV-2 viral  copies this assay can detect is 138 copies/mL. A negative result does not preclude SARS-Cov-2 infection and should not be used as the sole basis for treatment or other patient management decisions. A negative result may occur with  improper specimen collection/handling, submission of specimen other than nasopharyngeal swab, presence of viral mutation(s) within the areas targeted by this assay, and inadequate number of viral copies(<138 copies/mL). A negative result must be combined with clinical observations, patient history, and epidemiological information. The expected result is Negative.  Fact Sheet for Patients:  EntrepreneurPulse.com.au  Fact Sheet for Healthcare Providers:  IncredibleEmployment.be  This test is no t yet approved or cleared by the [place] FDA and  has been authorized for detection and/or diagnosis of SARS-CoV-2 by FDA under an Emergency Use Authorization (EUA). This EUA will remain  in effect (meaning this test can be used) for the duration of the COVID-19 declaration under Section 564(b)(1) of the Act, 21 U.S.C.section 360bbb-3(b)(1), unless the authorization is terminated  or revoked sooner.       Influenza A by PCR NEGATIVE NEGATIVE   Influenza B by PCR NEGATIVE NEGATIVE    Comment: (NOTE) The Xpert Xpress SARS-CoV-2/FLU/RSV plus assay is intended as an aid in the diagnosis of influenza from Nasopharyngeal swab specimens and should not be used as a sole basis for treatment. Nasal washings and aspirates are unacceptable for Xpert Xpress SARS-CoV-2/FLU/RSV testing.  Fact Sheet for Patients: EntrepreneurPulse.com.au  Fact Sheet for Healthcare Providers: IncredibleEmployment.be  This test is not yet approved or cleared by the [place] FDA and has been authorized for detection and/or diagnosis of SARS-CoV-2 by FDA under an Emergency Use Authorization (EUA). This EUA will  remain in effect (meaning this test can be used) for the duration of the COVID-19 declaration under Section 564(b)(1) of the Act, 21 U.S.C. section 360bbb-3(b)(1), unless the authorization is terminated or revoked.     Resp Syncytial  Virus by PCR NEGATIVE NEGATIVE    Comment: (NOTE) Fact Sheet for Patients: EntrepreneurPulse.com.au  Fact Sheet for Healthcare Providers: IncredibleEmployment.be  This test is not yet approved or cleared by the [place] FDA and has been authorized for detection and/or diagnosis of SARS-CoV-2 by FDA under an Emergency Use Authorization (EUA). This EUA will remain in effect (meaning this test can be used) for the duration of the COVID-19 declaration under Section 564(b)(1) of the Act, 21 U.S.C. section 360bbb-3(b)(1), unless the authorization is terminated or revoked.  Performed at [facility], [place] [address] [place], [place] [address]   Blood gas, venous     Status: Abnormal   Collection Time: 10/15/22  4:10 PM  Result Value Ref Range   pH, Ven 7.4 7.25 - 7.43   pCO2, Ven 52 44 - 60 mmHg   pO2, Ven 55 (H) 32 - 45 mmHg   Bicarbonate 32.2 (H) 20.0 - 28.0 mmol/L   Acid-Base Excess 6.0 (H) 0.0 - 2.0 mmol/L   O2 Saturation 85.9 %   Patient temperature 37.0     Comment: Performed at [facility], [place] [address], [place], [place] [address]  D-dimer, quantitative     Status: None   Collection Time: 10/15/22  4:10 PM  Result Value Ref Range   D-Dimer, Quant 0.41 0.00 - 0.50 ug/mL-FEU    Comment: (NOTE) At the manufacturer cut-off value of 0.5 g/mL FEU, this assay has a negative predictive value of 95-100%.This assay is intended for use in conjunction with a clinical pretest probability (PTP) assessment model to exclude pulmonary embolism (PE) and deep venous thrombosis (DVT) in outpatients suspected of PE or DVT. Results should be correlated with clinical  presentation. Performed at [facility], [place] [address], [place], [place] [address]   Troponin I (High Sensitivity)     Status: None   Collection Time: 10/15/22  5:37 PM  Result Value Ref Range   Troponin I (High Sensitivity) 9 <18 ng/L    Comment: (NOTE) Elevated high sensitivity troponin I (hsTnI) values and significant  changes across serial measurements may suggest ACS but many other  chronic and acute conditions are known to elevate hsTnI results.  Refer to the "Links" section for chest pain algorithms and additional  guidance. Performed at [facility], [place] [address], [place], [place] [id number]   Basic metabolic panel     Status: Abnormal   Collection Time: 10/16/22  3:51 AM  Result Value Ref Range   Sodium 139 135 - 145 mmol/L   Potassium 3.0 (L) 3.5 - 5.1 mmol/L   Chloride 110 98 - 111 mmol/L   CO2 21 (L) 22 - 32 mmol/L   Glucose, Bld 152 (H) 70 - 99 mg/dL    Comment: Glucose reference range applies only to samples taken after fasting for at least 8 hours.   BUN 16 8 - 23 mg/dL   Creatinine, Ser 0.59 0.44 - 1.00 mg/dL   Calcium 8.7 (L) 8.9 - 10.3 mg/dL   GFR, Estimated >60 >60 mL/min    Comment: (NOTE) Calculated using the CKD-EPI Creatinine Equation (2021)    Anion gap 8 5 - 15    Comment: Performed at [facility], [place] [address], [place], [place] [address]  CBC     Status: Abnormal   Collection Time: 10/16/22  3:51 AM  Result Value Ref Range   WBC 6.8 4.0 - 10.5 K/uL   RBC 4.23 3.87 -  5.11 MIL/uL   Hemoglobin 12.2 12.0 - 15.0 g/dL   HCT 37.5 36.0 - 46.0 %   MCV 88.7 80.0 - 100.0 fL   MCH 28.8 26.0 - 34.0 pg   MCHC 32.5 30.0 - 36.0 g/dL   RDW 17.3 (H) 11.5 - 15.5 %   Platelets 247 150 - 400 K/uL   nRBC 0.0 0.0 - 0.2 %    Comment: Performed at [facility], [place] [address] [place], [place] [address]   DG Chest 2 View  Result Date: 10/15/2022 CLINICAL DATA:  Shortness of breath.  EXAM: CHEST - 2 VIEW COMPARISON:  AP chest 05/11/2022, 12/29/2020; chest two views 12/02/2019; CT chest 04/30/2020 FINDINGS: Cardiac silhouette is again mildly enlarged. Mediastinal contours are within limits. Mild bilateral interstitial thickening appears chronic. It is difficult to exclude an element of minimal interstitial pulmonary edema. No pleural effusion or pneumothorax. Mild multilevel degenerative disc changes of the mid to lower thoracic spine with multilevel anterior bridging osteophytes. IMPRESSION: Mild bilateral interstitial thickening appears chronic. It is difficult to exclude an element of minimal superimposed interstitial pulmonary edema. Electronically Signed   By: Yvonne Kendall M.D.   On: 10/15/2022 16:03    Pending Labs Unresulted Labs (From admission, onward)     Start     Ordered   10/15/22 1745  Respiratory (~20 pathogens) panel by PCR  (COPD / Pneumonia / Cellulitis / Lower Extremity Wound)  Once,   R        10/15/22 1748            Vitals/Pain Today's Vitals   10/16/22 0918 10/16/22 0920 10/16/22 0949 10/16/22 1000  BP: 137/82 137/82  (!) 153/77  Pulse:  97 100 98  Resp:  19 19 16   Temp:      TempSrc:      SpO2:  93% 93% 92%  PainSc:        Isolation Precautions Droplet precaution  Medications Medications  amLODipine (NORVASC) tablet 5 mg (5 mg Oral Given 10/16/22 0918)  atorvastatin (LIPITOR) tablet 40 mg (40 mg Oral Given 10/16/22 0918)  valbenazine (INGREZZA) capsule 80 mg (80 mg Oral Given 10/16/22 0922)  pregabalin (LYRICA) capsule 75 mg (75 mg Oral Given 10/16/22 0920)  pantoprazole (PROTONIX) EC tablet 40 mg (40 mg Oral Given 10/16/22 0918)  sertraline (ZOLOFT) tablet 100 mg (100 mg Oral Given 10/15/22 2140)  QUEtiapine (SEROQUEL) tablet 200 mg (200 mg Oral Given 10/15/22 2140)  sucralfate (CARAFATE) tablet 1 g (1 g Oral Given 10/16/22 0921)  tiZANidine (ZANAFLEX) tablet 2 mg (has no administration in time range)  loratadine (CLARITIN) tablet  10 mg (10 mg Oral Given 10/16/22 0918)  azithromycin (ZITHROMAX) tablet 500 mg (500 mg Oral Given 10/16/22 0920)  budesonide (PULMICORT) nebulizer solution 2 mg (2 mg Nebulization Given 10/16/22 0821)  methylPREDNISolone sodium succinate (SOLU-MEDROL) 125 mg/2 mL injection 60 mg (60 mg Intravenous Given 10/16/22 0606)  ipratropium-albuterol (DUONEB) 0.5-2.5 (3) MG/3ML nebulizer solution 3 mL (3 mLs Nebulization Given 10/16/22 0818)  albuterol (PROVENTIL) (2.5 MG/3ML) 0.083% nebulizer solution 2.5 mg (has no administration in time range)  enoxaparin (LOVENOX) injection 40 mg (40 mg Subcutaneous Given 10/15/22 2140)  0.45 % sodium chloride infusion (0 mLs Intravenous Stopped 10/16/22 0604)  acetaminophen (TYLENOL) tablet 650 mg (has no administration in time range)    Or  acetaminophen (TYLENOL) suppository 650 mg (has no administration in time range)  ondansetron (ZOFRAN) tablet 4 mg (has no administration in time range)  Or  ondansetron (ZOFRAN) injection 4 mg (has no administration in time range)  hydrOXYzine (ATARAX) tablet 25 mg (has no administration in time range)  ipratropium-albuterol (DUONEB) 0.5-2.5 (3) MG/3ML nebulizer solution 9 mL (9 mLs Nebulization Given 10/15/22 1611)  methylPREDNISolone sodium succinate (SOLU-MEDROL) 125 mg/2 mL injection 125 mg (125 mg Intravenous Given 10/15/22 1621)  lactated ringers bolus 1,000 mL (0 mLs Intravenous Stopped 10/15/22 2018)  azithromycin (ZITHROMAX) 500 mg in sodium chloride 0.9 % 250 mL IVPB (0 mg Intravenous Stopped 10/15/22 2018)    Mobility walks with device Moderate fall risk   Focused Assessments Cardiac Assessment Handoff:    Lab Results  Component Value Date   CKTOTAL 87 09/15/2011   CKMB 3.3 09/15/2011   TROPONINI <0.03 03/22/2019   Lab Results  Component Value Date   DDIMER 0.41 10/15/2022   Does the Patient currently have chest pain? No   , Pulmonary Assessment Handoff:  Lung sounds:   O2 Device: Room  Air    R Recommendations: See Admitting Provider Note  Report given to:   Additional Notes:

## 2022-10-17 DIAGNOSIS — J9601 Acute respiratory failure with hypoxia: Secondary | ICD-10-CM | POA: Diagnosis not present

## 2022-10-17 DIAGNOSIS — F200 Paranoid schizophrenia: Secondary | ICD-10-CM | POA: Diagnosis not present

## 2022-10-17 DIAGNOSIS — J441 Chronic obstructive pulmonary disease with (acute) exacerbation: Secondary | ICD-10-CM | POA: Diagnosis not present

## 2022-10-17 MED ORDER — IPRATROPIUM-ALBUTEROL 0.5-2.5 (3) MG/3ML IN SOLN
3.0000 mL | Freq: Two times a day (BID) | RESPIRATORY_TRACT | Status: DC
  Administered 2022-10-17 – 2022-10-19 (×4): 3 mL via RESPIRATORY_TRACT
  Filled 2022-10-17 (×4): qty 3

## 2022-10-17 NOTE — Progress Notes (Signed)
PROGRESS NOTE    Danielle [name] NeedsGwendolyn Barca  ZHY:[id number] DOB: 1951-02-18 DOA: 10/15/2022 PCP: [facility]   Brief Narrative:  71 y.o. female with a past medical history of paranoid schizophrenia, asthma, essential hypertension, bipolar disorder who lives in an assisted living facility presented with shortness of breath and cough and was found to be saturating in the 80s on room air on presentation.  Influenza, RSV and COVID-19 PCR negative.  Chest x-ray did not show any infiltrate.  She was started on IV steroids and nebulizers and inhaled steroids.  Assessment & Plan:   COPD exacerbation Acute respiratory failure with hypoxia Tobacco abuse, ongoing -Currently on room air.  Wean off as able. -Continue IV Solu-Medrol.  Continue current nebs.  Patient was counseled regarding tobacco cessation -Will need outpatient pulmonary evaluation and follow-up  Nausea and vomiting -Possibly from above, triggered by respiratory illness.  Abdominal exam is benign and LFTs were normal on presentation. -Continue supportive care.  Improving.  Currently tolerating diet.  Essential hypertension -Blood pressure stable.  Continue amlodipine  History of psychiatric disorders -Her home regimen consisting of multiple psychotropic agents have been continued.  Outpatient follow-up with psychiatry.  Hyperlipidemia -Continue statin    DVT prophylaxis: Lovenox Code Status: Full Family Communication: None at bedside Disposition Plan: Status is: inpatient because: Of need for IV steroids   Consultants: None  Procedures: None  Antimicrobials: None   Subjective: Patient seen and examined at bedside.  Slow to respond.  Poor historian.  Feels slightly better breathing wise but still short of breath with minimal exertion.  Does not feel ready to go home today.  No fever, vomiting or chest pain reported. Objective: Vitals:   10/16/22 2330 10/17/22 0403 10/17/22 0500 10/17/22 0806  BP:  125/76     Pulse:  78    Resp:  20    Temp:  97.6 F (36.4 C)    TempSrc:  Oral    SpO2:  96%  97%  Weight:   73.7 kg   Height: 5\' 4"  (1.626 m)       Intake/Output Summary (Last 24 hours) at 10/17/2022 1144 Last data filed at 10/17/2022 0950 Gross per 24 hour  Intake --  Output 651 ml  Net -651 ml    Filed Weights   10/16/22 2209 10/17/22 0500  Weight: 73.4 kg 73.7 kg    Examination:  General: On room air.  No distress.  Left chronically ill and deconditioned. ENT/neck: No thyromegaly.  JVD is not elevated  respiratory: Decreased breath sounds at bases bilaterally with some crackles; and some wheezing  CVS: S1-S2 heard, rate controlled Abdominal: Soft, nontender, slightly distended; no organomegaly, normal bowel sounds are heard Extremities: Trace lower extremity edema; no cyanosis  CNS: Awake and alert.  Slow to respond.  Poor historian.  No focal neurologic deficit.  Moves extremities Lymph: No obvious lymphadenopathy Skin: No obvious ecchymosis/lesions  psych: Not agitated.  Affect is extremely flat.   Musculoskeletal: No obvious joint swelling/deformity    Data Reviewed: I have personally reviewed following labs and imaging studies  CBC: Recent Labs  Lab 10/15/22 1610 10/16/22 0351  WBC 10.8* 6.8  NEUTROABS 7.9*  --   HGB 13.5 12.2  HCT 41.3 37.5  MCV 88.2 88.7  PLT 272 247    Basic Metabolic Panel: Recent Labs  Lab 10/15/22 1610 10/16/22 0351  NA 141 139  K 3.6 3.0*  CL 106 110  CO2 28 21*  GLUCOSE 103* 152*  BUN 15 16  CREATININE 0.80 0.59  CALCIUM 10.0 8.7*    GFR: Estimated Creatinine Clearance: 65.3 mL/min (by C-G formula based on SCr of 0.59 mg/dL). Liver Function Tests: Recent Labs  Lab 10/15/22 1610  AST 21  ALT 16  ALKPHOS 65  BILITOT 0.7  PROT 7.9  ALBUMIN 4.3    No results for input(s): "LIPASE", "AMYLASE" in the last 168 hours. No results for input(s): "AMMONIA" in the last 168 hours. Coagulation Profile: No results for  input(s): "INR", "PROTIME" in the last 168 hours. Cardiac Enzymes: No results for input(s): "CKTOTAL", "CKMB", "CKMBINDEX", "TROPONINI" in the last 168 hours. BNP (last 3 results) No results for input(s): "PROBNP" in the last 8760 hours. HbA1C: No results for input(s): "HGBA1C" in the last 72 hours. CBG: No results for input(s): "GLUCAP" in the last 168 hours. Lipid Profile: No results for input(s): "CHOL", "HDL", "LDLCALC", "TRIG", "CHOLHDL", "LDLDIRECT" in the last 72 hours. Thyroid Function Tests: No results for input(s): "TSH", "T4TOTAL", "FREET4", "T3FREE", "THYROIDAB" in the last 72 hours. Anemia Panel: No results for input(s): "VITAMINB12", "FOLATE", "FERRITIN", "TIBC", "IRON", "RETICCTPCT" in the last 72 hours. Sepsis Labs: No results for input(s): "PROCALCITON", "LATICACIDVEN" in the last 168 hours.  Recent Results (from the past 240 hour(s))  Resp panel by RT-PCR (RSV, Flu A&B, Covid) Anterior Nasal Swab     Status: None   Collection Time: 10/15/22  4:10 PM   Specimen: Anterior Nasal Swab  Result Value Ref Range Status   SARS Coronavirus 2 by RT PCR NEGATIVE NEGATIVE Final    Comment: (NOTE) SARS-CoV-2 target nucleic acids are NOT DETECTED.  The SARS-CoV-2 RNA is generally detectable in upper respiratory specimens during the acute phase of infection. The lowest concentration of SARS-CoV-2 viral copies this assay can detect is 138 copies/mL. A negative result does not preclude SARS-Cov-2 infection and should not be used as the sole basis for treatment or other patient management decisions. A negative result may occur with  improper specimen collection/handling, submission of specimen other than nasopharyngeal swab, presence of viral mutation(s) within the areas targeted by this assay, and inadequate number of viral copies(<138 copies/mL). A negative result must be combined with clinical observations, patient history, and epidemiological information. The expected result  is Negative.  Fact Sheet for Patients:  BloggerCourse.com  Fact Sheet for Healthcare Providers:  SeriousBroker.it  This test is no t yet approved or cleared by the [place] FDA and  has been authorized for detection and/or diagnosis of SARS-CoV-2 by FDA under an Emergency Use Authorization (EUA). This EUA will remain  in effect (meaning this test can be used) for the duration of the COVID-19 declaration under Section 564(b)(1) of the Act, 21 U.S.C.section 360bbb-3(b)(1), unless the authorization is terminated  or revoked sooner.       Influenza A by PCR NEGATIVE NEGATIVE Final   Influenza B by PCR NEGATIVE NEGATIVE Final    Comment: (NOTE) The Xpert Xpress SARS-CoV-2/FLU/RSV plus assay is intended as an aid in the diagnosis of influenza from Nasopharyngeal swab specimens and should not be used as a sole basis for treatment. Nasal washings and aspirates are unacceptable for Xpert Xpress SARS-CoV-2/FLU/RSV testing.  Fact Sheet for Patients: BloggerCourse.com  Fact Sheet for Healthcare Providers: SeriousBroker.it  This test is not yet approved or cleared by the [place] FDA and has been authorized for detection and/or diagnosis of SARS-CoV-2 by FDA under an Emergency Use Authorization (EUA). This EUA will remain in effect (meaning this test can be used) for the duration  of the COVID-19 declaration under Section 564(b)(1) of the Act, 21 U.S.C. section 360bbb-3(b)(1), unless the authorization is terminated or revoked.     Resp Syncytial Virus by PCR NEGATIVE NEGATIVE Final    Comment: (NOTE) Fact Sheet for Patients: BloggerCourse.com  Fact Sheet for Healthcare Providers: SeriousBroker.it  This test is not yet approved or cleared by the [place] FDA and has been authorized for detection and/or diagnosis of  SARS-CoV-2 by FDA under an Emergency Use Authorization (EUA). This EUA will remain in effect (meaning this test can be used) for the duration of the COVID-19 declaration under Section 564(b)(1) of the Act, 21 U.S.C. section 360bbb-3(b)(1), unless the authorization is terminated or revoked.  Performed at [facility], [address] [place], [place] [address]   Respiratory (~20 pathogens) panel by PCR     Status: Abnormal   Collection Time: 10/16/22 11:44 AM   Specimen: Nasopharyngeal Swab; Respiratory  Result Value Ref Range Status   Adenovirus NOT DETECTED NOT DETECTED Final   Coronavirus 229E NOT DETECTED NOT DETECTED Final    Comment: (NOTE) The Coronavirus on the Respiratory Panel, DOES NOT test for the novel  Coronavirus (2019 nCoV)    Coronavirus HKU1 NOT DETECTED NOT DETECTED Final   Coronavirus NL63 NOT DETECTED NOT DETECTED Final   Coronavirus OC43 NOT DETECTED NOT DETECTED Final   Metapneumovirus NOT DETECTED NOT DETECTED Final   Rhinovirus / Enterovirus DETECTED (A) NOT DETECTED Final   Influenza A NOT DETECTED NOT DETECTED Final   Influenza B NOT DETECTED NOT DETECTED Final   Parainfluenza Virus 1 NOT DETECTED NOT DETECTED Final   Parainfluenza Virus 2 NOT DETECTED NOT DETECTED Final   Parainfluenza Virus 3 NOT DETECTED NOT DETECTED Final   Parainfluenza Virus 4 NOT DETECTED NOT DETECTED Final   Respiratory Syncytial Virus NOT DETECTED NOT DETECTED Final   Bordetella pertussis NOT DETECTED NOT DETECTED Final   Bordetella Parapertussis NOT DETECTED NOT DETECTED Final   Chlamydophila pneumoniae NOT DETECTED NOT DETECTED Final   Mycoplasma pneumoniae NOT DETECTED NOT DETECTED Final    Comment: Performed at [facility], [address], [place], [place] [address]         Radiology Studies: DG Chest 2 View  Result Date: 10/15/2022 CLINICAL DATA:  Shortness of breath. EXAM: CHEST - 2 VIEW COMPARISON:  AP chest 05/11/2022, 12/29/2020;  chest two views 12/02/2019; CT chest 04/30/2020 FINDINGS: Cardiac silhouette is again mildly enlarged. Mediastinal contours are within limits. Mild bilateral interstitial thickening appears chronic. It is difficult to exclude an element of minimal interstitial pulmonary edema. No pleural effusion or pneumothorax. Mild multilevel degenerative disc changes of the mid to lower thoracic spine with multilevel anterior bridging osteophytes. IMPRESSION: Mild bilateral interstitial thickening appears chronic. It is difficult to exclude an element of minimal superimposed interstitial pulmonary edema. Electronically Signed   By: Neita Garnet M.D.   On: 10/15/2022 16:03        Scheduled Meds:  amLODipine  5 mg Oral Daily   atorvastatin  40 mg Oral Daily   azithromycin  500 mg Oral Daily   budesonide (PULMICORT) nebulizer solution  2 mg Nebulization Q12H   enoxaparin (LOVENOX) injection  40 mg Subcutaneous Q24H   ipratropium-albuterol  3 mL Nebulization BID   loratadine  10 mg Oral Daily   methylPREDNISolone (SOLU-MEDROL) injection  60 mg Intravenous Q12H   pantoprazole  40 mg Oral Daily   pregabalin  75 mg Oral TID   QUEtiapine  200  mg Oral QHS   sertraline  100 mg Oral QHS   sucralfate  1 g Oral QID   valbenazine  80 mg Oral Daily   Continuous Infusions:        Glade Lloyd, MD [facility] 10/17/2022, 11:44 AM

## 2022-10-17 NOTE — Progress Notes (Signed)
Mobility Specialist - Progress Note   10/17/22 1116  Mobility  Activity Ambulated with assistance in hallway  Level of Assistance Standby assist, set-up cues, supervision of patient - no hands on  Assistive Device Front wheel walker  Distance Ambulated (ft) 275 ft  Activity Response Tolerated well  Mobility Referral Yes  $Mobility charge 1 Mobility   Pt received in bed and agreeable to mobility. Pt SOB towards EOS. No other complaints during session. Pt to bed after session with all needs met.    [facility]

## 2022-10-17 NOTE — Plan of Care (Signed)

## 2022-10-18 DIAGNOSIS — J441 Chronic obstructive pulmonary disease with (acute) exacerbation: Secondary | ICD-10-CM | POA: Diagnosis not present

## 2022-10-18 DIAGNOSIS — J9601 Acute respiratory failure with hypoxia: Secondary | ICD-10-CM | POA: Diagnosis not present

## 2022-10-18 LAB — BASIC METABOLIC PANEL
Anion gap: 12 (ref 5–15)
BUN: 26 mg/dL — ABNORMAL HIGH (ref 8–23)
CO2: 22 mmol/L (ref 22–32)
Calcium: 9.8 mg/dL (ref 8.9–10.3)
Chloride: 104 mmol/L (ref 98–111)
Creatinine, Ser: 0.67 mg/dL (ref 0.44–1.00)
GFR, Estimated: >60 mL/min (ref >60)
Glucose, Bld: 117 mg/dL — ABNORMAL HIGH (ref 70–99)
Potassium: 3.6 mmol/L (ref 3.5–5.1)
Sodium: 138 mmol/L (ref 135–145)

## 2022-10-18 LAB — MAGNESIUM: Magnesium: 2.2 mg/dL (ref 1.7–2.4)

## 2022-10-18 MED ORDER — BUDESONIDE 0.5 MG/2ML IN SUSP
0.5000 mg | Freq: Two times a day (BID) | RESPIRATORY_TRACT | Status: DC
  Administered 2022-10-18 – 2022-10-19 (×2): 0.5 mg via RESPIRATORY_TRACT
  Filled 2022-10-18 (×2): qty 2

## 2022-10-18 MED ORDER — METHYLPREDNISOLONE SODIUM SUCC 125 MG IJ SOLR
80.0000 mg | Freq: Two times a day (BID) | INTRAMUSCULAR | Status: DC
  Administered 2022-10-18 – 2022-10-19 (×2): 80 mg via INTRAVENOUS
  Filled 2022-10-18 (×2): qty 2

## 2022-10-18 NOTE — Progress Notes (Signed)
PROGRESS NOTE    Danielle [name]  DVV:[id number] DOB: Dec 02, 1950 DOA: 10/15/2022 PCP: [facility]   Brief Narrative:  72 y.o. female with a past medical history of paranoid schizophrenia, asthma, essential hypertension, bipolar disorder who lives in an assisted living facility presented with shortness of breath and cough and was found to be saturating in the 80s on room air on presentation.  Influenza, RSV and COVID-19 PCR negative.  Chest x-ray did not show any infiltrate.  She was started on IV steroids and nebulizers and inhaled steroids.  Assessment & Plan:   COPD exacerbation Acute respiratory failure with hypoxia Tobacco abuse, ongoing -Currently on room air.  Wean off as able. -Still does not feel well and does not feel ready to go home today.  Increase Solu-Medrol to 80 mg IV every 12 hours.  Continue current nebs.  Patient was counseled regarding tobacco cessation -Will need outpatient pulmonary evaluation and follow-up  Nausea and vomiting -Possibly from above, triggered by respiratory illness.  Abdominal exam is benign and LFTs were normal on presentation. -Continue supportive care.  Improving.  Currently tolerating diet.  Essential hypertension -Blood pressure stable.  Continue amlodipine  History of psychiatric disorders -Her home regimen consisting of multiple psychotropic agents have been continued.  Outpatient follow-up with psychiatry.  Hyperlipidemia -Continue statin  Hypokalemia -Improved    DVT prophylaxis: Lovenox Code Status: Full Family Communication: None at bedside Disposition Plan: Status is: inpatient because: Of need for IV steroids   Consultants: None  Procedures: None  Antimicrobials: None   Subjective: Patient seen and examined at bedside.  Slow to respond.  Poor historian.  Does not feel ready to go home today as well.  Still short of breath with minimal exertion with wheezing.  No fever or vomiting reported.   Objective: Vitals:   10/17/22 1739 10/17/22 1944 10/18/22 0618 10/18/22 0816  BP:  135/81 130/88   Pulse:  80 88   Resp:  18 20   Temp:  98.7 F (37.1 C) 98.6 F (37 C)   TempSrc:  Oral Oral   SpO2: 96% 94% 96% 91%  Weight:   71.5 kg   Height:        Intake/Output Summary (Last 24 hours) at 10/18/2022 1033 Last data filed at 10/18/2022 0500 Gross per 24 hour  Intake 120 ml  Output 1250 ml  Net -1130 ml    Filed Weights   10/16/22 2209 10/17/22 0500 10/18/22 0618  Weight: 73.4 kg 73.7 kg 71.5 kg    Examination:  General: On room air.  No distress.  respiratory: Decreased breath sounds at bases bilaterally with some crackles and wheezing CVS: Currently rate controlled; S1-S2 heard  abdominal: Soft, nontender, slightly distended, no organomegaly; bowel sounds heard normally  extremities: Trace lower extremity edema; no clubbing.       Data Reviewed: I have personally reviewed following labs and imaging studies  CBC: Recent Labs  Lab 10/15/22 1610 10/16/22 0351  WBC 10.8* 6.8  NEUTROABS 7.9*  --   HGB 13.5 12.2  HCT 41.3 37.5  MCV 88.2 88.7  PLT 272 626    Basic Metabolic Panel: Recent Labs  Lab 10/15/22 1610 10/16/22 0351 10/18/22 0711  NA 141 139 138  K 3.6 3.0* 3.6  CL 106 110 104  CO2 28 21* 22  GLUCOSE 103* 152* 117*  BUN 15 16 26*  CREATININE 0.80 0.59 0.67  CALCIUM 10.0 8.7* 9.8  MG  --   --  2.2  GFR: Estimated Creatinine Clearance: 64.3 mL/min (by C-G formula based on SCr of 0.67 mg/dL). Liver Function Tests: Recent Labs  Lab 10/15/22 1610  AST 21  ALT 16  ALKPHOS 65  BILITOT 0.7  PROT 7.9  ALBUMIN 4.3    No results for input(s): "LIPASE", "AMYLASE" in the last 168 hours. No results for input(s): "AMMONIA" in the last 168 hours. Coagulation Profile: No results for input(s): "INR", "PROTIME" in the last 168 hours. Cardiac Enzymes: No results for input(s): "CKTOTAL", "CKMB", "CKMBINDEX", "TROPONINI" in the last 168 hours. BNP  (last 3 results) No results for input(s): "PROBNP" in the last 8760 hours. HbA1C: No results for input(s): "HGBA1C" in the last 72 hours. CBG: No results for input(s): "GLUCAP" in the last 168 hours. Lipid Profile: No results for input(s): "CHOL", "HDL", "LDLCALC", "TRIG", "CHOLHDL", "LDLDIRECT" in the last 72 hours. Thyroid Function Tests: No results for input(s): "TSH", "T4TOTAL", "FREET4", "T3FREE", "THYROIDAB" in the last 72 hours. Anemia Panel: No results for input(s): "VITAMINB12", "FOLATE", "FERRITIN", "TIBC", "IRON", "RETICCTPCT" in the last 72 hours. Sepsis Labs: No results for input(s): "PROCALCITON", "LATICACIDVEN" in the last 168 hours.  Recent Results (from the past 240 hour(s))  Resp panel by RT-PCR (RSV, Flu A&B, Covid) Anterior Nasal Swab     Status: None   Collection Time: 10/15/22  4:10 PM   Specimen: Anterior Nasal Swab  Result Value Ref Range Status   SARS Coronavirus 2 by RT PCR NEGATIVE NEGATIVE Final    Comment: (NOTE) SARS-CoV-2 target nucleic acids are NOT DETECTED.  The SARS-CoV-2 RNA is generally detectable in upper respiratory specimens during the acute phase of infection. The lowest concentration of SARS-CoV-2 viral copies this assay can detect is 138 copies/mL. A negative result does not preclude SARS-Cov-2 infection and should not be used as the sole basis for treatment or other patient management decisions. A negative result may occur with  improper specimen collection/handling, submission of specimen other than nasopharyngeal swab, presence of viral mutation(s) within the areas targeted by this assay, and inadequate number of viral copies(<138 copies/mL). A negative result must be combined with clinical observations, patient history, and epidemiological information. The expected result is Negative.  Fact Sheet for Patients:  BloggerCourse.com  Fact Sheet for Healthcare Providers:   SeriousBroker.it  This test is no t yet approved or cleared by the [place] FDA and  has been authorized for detection and/or diagnosis of SARS-CoV-2 by FDA under an Emergency Use Authorization (EUA). This EUA will remain  in effect (meaning this test can be used) for the duration of the COVID-19 declaration under Section 564(b)(1) of the Act, 21 U.S.C.section 360bbb-3(b)(1), unless the authorization is terminated  or revoked sooner.       Influenza A by PCR NEGATIVE NEGATIVE Final   Influenza B by PCR NEGATIVE NEGATIVE Final    Comment: (NOTE) The Xpert Xpress SARS-CoV-2/FLU/RSV plus assay is intended as an aid in the diagnosis of influenza from Nasopharyngeal swab specimens and should not be used as a sole basis for treatment. Nasal washings and aspirates are unacceptable for Xpert Xpress SARS-CoV-2/FLU/RSV testing.  Fact Sheet for Patients: BloggerCourse.com  Fact Sheet for Healthcare Providers: SeriousBroker.it  This test is not yet approved or cleared by the [place] FDA and has been authorized for detection and/or diagnosis of SARS-CoV-2 by FDA under an Emergency Use Authorization (EUA). This EUA will remain in effect (meaning this test can be used) for the duration of the COVID-19 declaration under Section 564(b)(1) of the Act,  21 U.S.C. section 360bbb-3(b)(1), unless the authorization is terminated or revoked.     Resp Syncytial Virus by PCR NEGATIVE NEGATIVE Final    Comment: (NOTE) Fact Sheet for Patients: EntrepreneurPulse.com.au  Fact Sheet for Healthcare Providers: IncredibleEmployment.be  This test is not yet approved or cleared by the [place] FDA and has been authorized for detection and/or diagnosis of SARS-CoV-2 by FDA under an Emergency Use Authorization (EUA). This EUA will remain in effect (meaning this test can be used) for  the duration of the COVID-19 declaration under Section 564(b)(1) of the Act, 21 U.S.C. section 360bbb-3(b)(1), unless the authorization is terminated or revoked.  Performed at [facility], [address] [place], [place] [address]   Respiratory (~20 pathogens) panel by PCR     Status: Abnormal   Collection Time: 10/16/22 11:44 AM   Specimen: Nasopharyngeal Swab; Respiratory  Result Value Ref Range Status   Adenovirus NOT DETECTED NOT DETECTED Final   Coronavirus 229E NOT DETECTED NOT DETECTED Final    Comment: (NOTE) The Coronavirus on the Respiratory Panel, DOES NOT test for the novel  Coronavirus (2019 nCoV)    Coronavirus HKU1 NOT DETECTED NOT DETECTED Final   Coronavirus NL63 NOT DETECTED NOT DETECTED Final   Coronavirus OC43 NOT DETECTED NOT DETECTED Final   Metapneumovirus NOT DETECTED NOT DETECTED Final   Rhinovirus / Enterovirus DETECTED (A) NOT DETECTED Final   Influenza A NOT DETECTED NOT DETECTED Final   Influenza B NOT DETECTED NOT DETECTED Final   Parainfluenza Virus 1 NOT DETECTED NOT DETECTED Final   Parainfluenza Virus 2 NOT DETECTED NOT DETECTED Final   Parainfluenza Virus 3 NOT DETECTED NOT DETECTED Final   Parainfluenza Virus 4 NOT DETECTED NOT DETECTED Final   Respiratory Syncytial Virus NOT DETECTED NOT DETECTED Final   Bordetella pertussis NOT DETECTED NOT DETECTED Final   Bordetella Parapertussis NOT DETECTED NOT DETECTED Final   Chlamydophila pneumoniae NOT DETECTED NOT DETECTED Final   Mycoplasma pneumoniae NOT DETECTED NOT DETECTED Final    Comment: Performed at [facility], [address] [place],  [address]         Radiology Studies: No results found.      Scheduled Meds:  amLODipine  5 mg Oral Daily   atorvastatin  40 mg Oral Daily   azithromycin  500 mg Oral Daily   budesonide (PULMICORT) nebulizer solution  0.5 mg Nebulization Q12H   enoxaparin (LOVENOX) injection  40 mg Subcutaneous Q24H    ipratropium-albuterol  3 mL Nebulization BID   loratadine  10 mg Oral Daily   methylPREDNISolone (SOLU-MEDROL) injection  60 mg Intravenous Q12H   pantoprazole  40 mg Oral Daily   pregabalin  75 mg Oral TID   QUEtiapine  200 mg Oral QHS   sertraline  100 mg Oral QHS   sucralfate  1 g Oral QID   valbenazine  80 mg Oral Daily   Continuous Infusions:        Aline August, MD [facility] 10/18/2022, 10:33 AM

## 2022-10-18 NOTE — Plan of Care (Signed)

## 2022-10-18 NOTE — TOC Initial Note (Signed)
Transition of Care [facility]) - Initial/Assessment Note    Patient Details  Name: Danielle [name] MRN: [id number] Date of Birth: 1951/05/19  Transition of [facility]) CM/SW Contact:    Dessa Phi, RN Phone Number: 10/18/2022, 12:53 PM  Clinical Narrative: From [facility] will confirm-ALF d/c plan to return.                  Expected Discharge Plan: Assisted Living Barriers to Discharge: Continued Medical Work up   Patient Goals and CMS Choice            Expected Discharge Plan and Services                                              Prior Living Arrangements/Services                       Activities of Daily Living Home Assistive Devices/Equipment: Environmental consultant (specify type) ADL Screening (condition at time of admission) Patient's cognitive ability adequate to safely complete daily activities?: Yes Is the patient deaf or have difficulty hearing?: No Does the patient have difficulty seeing, even when wearing glasses/contacts?: No Does the patient have difficulty concentrating, remembering, or making decisions?: No Patient able to express need for assistance with ADLs?: Yes Does the patient have difficulty dressing or bathing?: Yes Independently performs ADLs?: No Communication: Independent Dressing (OT): Needs assistance Is this a change from baseline?: Change from baseline, expected to last <3days Grooming: Needs assistance Is this a change from baseline?: Change from baseline, expected to last <3 days Feeding: Independent Bathing: Needs assistance Is this a change from baseline?: Change from baseline, expected to last <3 days Toileting: Needs assistance Is this a change from baseline?: Change from baseline, expected to last <3 days In/Out Bed: Needs assistance Is this a change from baseline?: Change from baseline, expected to last <3 days Walks in Home: Independent Does the patient have difficulty walking or climbing stairs?: Yes Weakness of  Legs: Both Weakness of Arms/Hands: None  Permission Sought/Granted                  Emotional Assessment              Admission diagnosis:  SOB (shortness of breath) [R06.02] COPD exacerbation ([place]) [J44.1] COPD with acute exacerbation ([place]) [J44.1] Patient Active Problem List   Diagnosis Date Noted   COPD exacerbation ([place]) 10/16/2022   COPD with acute exacerbation ([place]) 10/15/2022   Acute respiratory failure with hypoxia ([place]) 10/15/2022   GI bleeding 05/11/2022   Acute blood loss anemia 05/11/2022   Major depressive disorder, recurrent episode, moderate ([place])    Dysarthria 12/29/2020   AKI (acute kidney injury) ([place]) 12/29/2020   CAD (coronary artery disease) 03/22/2019   Unstable angina (HCC)    Hypokalemia 08/11/2018   Tobacco abuse 08/11/2018   Memory disturbance 01/30/2016   Schizophrenia, paranoid type ([place]) 04/28/2012   Cocaine abuse ([place]) 04/28/2012   Cannabis abuse 04/28/2012   Substance induced mood disorder ([place]) 04/28/2012   Noncompliance 09/16/2011   Anxiety 09/16/2011   Constipation 09/16/2011   SUBSTANCE ABUSE, MULTIPLE 12/03/2008   HYPERLIPIDEMIA 08/01/2008   MIGRAINE HEADACHE 08/01/2008   ASTHMA 08/01/2008   DEPRESSION 06/20/2008   ANXIETY DISORDER, GENERALIZED 05/28/2008   HYPERTENSION, BENIGN ESSENTIAL 05/28/2008   GERD 05/28/2008   DEEP VENOUS THROMBOPHLEBITIS, LEFT, LEG, HX OF  10/18/1988   PCP:  [facility], [place]:   [place], [address] [place] [address] Phone: [phone] Fax: [phone]     Social Determinants of Health (SDOH) Social History: [place]: No Food Insecurity (10/16/2022)  Housing: Low Risk  (10/16/2022)  Transportation Needs: No Transportation Needs (10/16/2022)  Utilities: Not At Risk (10/16/2022)  Depression (PHQ2-9): Low Risk  (06/18/2019)  Tobacco Use: High Risk  (10/15/2022)   SDOH Interventions: Housing Interventions: Patient Refused   Readmission Risk Interventions    05/14/2022    1:50 PM  Readmission Risk Prevention Plan  Transportation Screening Complete  PCP or Specialist Appt within 5-7 Days Complete  Home Care Screening Complete  Medication Review (RN CM) Complete

## 2022-10-19 DIAGNOSIS — F200 Paranoid schizophrenia: Secondary | ICD-10-CM | POA: Diagnosis not present

## 2022-10-19 DIAGNOSIS — J441 Chronic obstructive pulmonary disease with (acute) exacerbation: Secondary | ICD-10-CM | POA: Diagnosis not present

## 2022-10-19 DIAGNOSIS — J9601 Acute respiratory failure with hypoxia: Secondary | ICD-10-CM | POA: Diagnosis not present

## 2022-10-19 MED ORDER — ALBUTEROL SULFATE HFA 108 (90 BASE) MCG/ACT IN AERS: 1.0000 | INHALATION_SPRAY | RESPIRATORY_TRACT | Status: AC | PRN

## 2022-10-19 MED ORDER — BUDESONIDE-FORMOTEROL FUMARATE 160-4.5 MCG/ACT IN AERO: 2.0000 | INHALATION_SPRAY | Freq: Two times a day (BID) | RESPIRATORY_TRACT | 0 refills | Status: DC

## 2022-10-19 MED ORDER — PREDNISONE 20 MG PO TABS: 40.0000 mg | ORAL_TABLET | Freq: Every day | ORAL | 0 refills | Status: AC

## 2022-10-19 NOTE — Care Management Important Message (Signed)
Important Message  Patient Details IM Letter given. Name: Danielle [name] MRN: [id number] Date of Birth: 07-28-1951   Medicare Important Message Given:  Yes     Kerin Salen 10/19/2022, 9:54 AM

## 2022-10-19 NOTE — TOC Transition Note (Addendum)
Transition of Care [facility]) - CM/SW Discharge Note   Patient Details  Name: Danielle [name] MRN: [id number] Date of Birth: Jun 19, 1951  Transition of Care [facility]) CM/SW Contact:  Vassie Moselle, LCSW Phone Number: 10/19/2022, 10:50 AM   Clinical Narrative:    Pt is to return to [facility]. Spoke with [facility] and confirmed pt's return. FL-2 and discharge summary will need to be faxed to their facility prior to discharge. RN to call report to [phone]. Pt will be going to room 403. ALF is to provide transportation for pt.    Final next level of care: Assisted Living Barriers to Discharge: Barriers Resolved   Patient Goals and CMS Choice CMS Medicare.gov Compare Post Acute Care list provided to:: Patient Choice offered to / list presented to : Patient  Discharge Placement                Patient chooses bed at: Other - please specify in the comment section below: [facility]) Patient to be transferred to facility by: Family or public transportation Name of family member notified: Patient Patient and family notified of of transfer: 10/19/22  Discharge Plan and Services Additional resources added to the After Visit Summary for                  DME Arranged: N/A DME Agency: NA                  Social Determinants of Health ([place]) Interventions [place]: No Food Insecurity (10/16/2022)  Housing: [employer]  (10/16/2022)  Transportation Needs: No Transportation Needs (10/16/2022)  Utilities: Not At Risk (10/16/2022)  Depression (PHQ2-9): Low Risk  (06/18/2019)  Tobacco Use: High Risk (10/15/2022)     Readmission Risk Interventions    10/19/2022   10:48 AM 10/18/2022   12:54 PM 05/14/2022    1:50 PM  Readmission Risk Prevention Plan  Transportation Screening Complete Complete Complete  PCP or Specialist Appt within 5-7 Days   Complete  PCP or Specialist Appt within 3-5 Days Complete Complete   Home Care Screening    Complete  Medication Review (RN CM)   Complete  HRI or Home Care Consult Complete Complete   Social Work Consult for [place] Planning/Counseling Complete Complete   Palliative Care Screening Complete Complete   Medication Review Press photographer) Complete Complete

## 2022-10-19 NOTE — Progress Notes (Signed)
Report called to nurse at patient's ALF. Facility is providing transportation home. Paperwork sent with patient to facility. All belongings sent with patient.

## 2022-10-19 NOTE — Progress Notes (Signed)
Mobility Specialist - Progress Note   10/19/22 1014  Oxygen Therapy  O2 Device Room Air  Mobility  Activity Ambulated with assistance in hallway  Level of Assistance Standby assist, set-up cues, supervision of patient - no hands on  Assistive Device Front wheel walker  Distance Ambulated (ft) 250 ft  Range of Motion/Exercises Active  Activity Response Tolerated well  Mobility Referral Yes  $Mobility charge 1 Mobility   Pt received in bed and agreeable to mobility. Pt was SOB throughout session, but no complaints. Pt to recliner after session with all needs met.    Pre-mobility: 92%  SpO2 During mobility: 97% SpO2 Post-mobility: 98% SPO2  Set designer

## 2022-10-19 NOTE — NC FL2 (Signed)
[place] MEDICAID FL2 LEVEL OF CARE FORM     IDENTIFICATION  Patient Name: Danielle [name] Birthdate: 1951/02/05 Sex: female Admission Date (Current Location): 10/15/2022  [facility] and [place] Number:  Herbalist and Address:  [facility],  [place], [place]      Provider Number: [id number]  Attending Physician Name and Address:  Aline August, MD  Relative Name and Phone Number:  Daughter, Margo [place] [phone]    Current Level of Care: Hospital Recommended Level of Care: [place] Prior Approval Number:    Date Approved/Denied:   PASRR Number:    Discharge Plan: Other (Comment) (ALF)    Current Diagnoses: Patient Active Problem List   Diagnosis Date Noted   COPD exacerbation ([place]) 10/16/2022   COPD with acute exacerbation ([place]) 10/15/2022   Acute respiratory failure with hypoxia ([place]) 10/15/2022   GI bleeding 05/11/2022   Acute blood loss anemia 05/11/2022   Major depressive disorder, recurrent episode, moderate ([place])    Dysarthria 12/29/2020   AKI (acute kidney injury) ([place]) 12/29/2020   CAD (coronary artery disease) 03/22/2019   Unstable angina (HCC)    Hypokalemia 08/11/2018   Tobacco abuse 08/11/2018   Memory disturbance 01/30/2016   Schizophrenia, paranoid type ([place]) 04/28/2012   Cocaine abuse ([place]) 04/28/2012   Cannabis abuse 04/28/2012   Substance induced mood disorder ([place]) 04/28/2012   Noncompliance 09/16/2011   Anxiety 09/16/2011   Constipation 09/16/2011   SUBSTANCE ABUSE, MULTIPLE 12/03/2008   HYPERLIPIDEMIA 08/01/2008   MIGRAINE HEADACHE 08/01/2008   ASTHMA 08/01/2008   DEPRESSION 06/20/2008   ANXIETY DISORDER, GENERALIZED 05/28/2008   HYPERTENSION, BENIGN ESSENTIAL 05/28/2008   GERD 05/28/2008   DEEP VENOUS THROMBOPHLEBITIS, LEFT, LEG, HX OF 10/18/1988    Orientation RESPIRATION BLADDER Height & Weight     Self, Time, Situation, Place  Normal Incontinent, External  catheter Weight: 163 lb 12.8 oz (74.3 kg) Height:  5\' 4"  (162.6 cm)  BEHAVIORAL SYMPTOMS/MOOD NEUROLOGICAL BOWEL NUTRITION STATUS      Continent Diet (Regular)  AMBULATORY STATUS COMMUNICATION OF NEEDS Skin   Supervision Verbally Normal                       Personal Care Assistance Level of Assistance  Bathing, Feeding, Dressing Bathing Assistance: Limited assistance Feeding assistance: Independent Dressing Assistance: Limited assistance     Functional Limitations Info  Sight, Hearing, Speech Sight Info: Impaired Hearing Info: Adequate Speech Info: Adequate    SPECIAL CARE FACTORS FREQUENCY                       Contractures Contractures Info: Not present    Additional Factors Info  Code Status, Allergies, Psychotropic Code Status Info: FULL Allergies Info: Clonazepam, Doxycycline, Fosinopril Sodium, Hydrochlorothiazide W-triamterene, Penicillins, Sulfa Antibiotics, Triamterene Psychotropic Info: See MAR         Current Medications (10/19/2022):  This is the current hospital active medication list Current Facility-Administered Medications  Medication Dose Route Frequency Provider Last Rate Last Admin   acetaminophen (TYLENOL) tablet 650 mg  650 mg Oral Q6H PRN Bonnielee Haff, MD       Or   acetaminophen (TYLENOL) suppository 650 mg  650 mg Rectal Q6H PRN Bonnielee Haff, MD       albuterol (PROVENTIL) (2.5 MG/3ML) 0.083% nebulizer solution 2.5 mg  2.5 mg Nebulization Q2H PRN Bonnielee Haff, MD   2.5 mg at 10/18/22 1444   amLODipine (NORVASC) tablet 5 mg  5 mg Oral Daily Bonnielee Haff, MD   5 mg at 10/19/22 0948   atorvastatin (LIPITOR) tablet 40 mg  40 mg Oral Daily Bonnielee Haff, MD   40 mg at 10/19/22 0947   budesonide (PULMICORT) nebulizer solution 0.5 mg  0.5 mg Nebulization Q12H Alekh, Kshitiz, MD   0.5 mg at 10/19/22 0931   enoxaparin (LOVENOX) injection 40 mg  40 mg Subcutaneous Q24H Bonnielee Haff, MD   40 mg at 10/18/22 2121   hydrOXYzine  (ATARAX) tablet 25 mg  25 mg Oral BID PRN Bonnielee Haff, MD   25 mg at 10/17/22 2126   ipratropium-albuterol (DUONEB) 0.5-2.5 (3) MG/3ML nebulizer solution 3 mL  3 mL Nebulization BID Bonnielee Haff, MD   3 mL at 10/19/22 0923   loratadine (CLARITIN) tablet 10 mg  10 mg Oral Daily Bonnielee Haff, MD   10 mg at 10/19/22 0947   methylPREDNISolone sodium succinate (SOLU-MEDROL) 125 mg/2 mL injection 80 mg  80 mg Intravenous Q12H Alekh, Kshitiz, MD   80 mg at 10/19/22 0516   ondansetron (ZOFRAN) tablet 4 mg  4 mg Oral Q6H PRN Bonnielee Haff, MD       Or   ondansetron [facility]) injection 4 mg  4 mg Intravenous Q6H PRN Bonnielee Haff, MD       oxyCODONE (Oxy IR/ROXICODONE) immediate release tablet 5 mg  5 mg Oral Q6H PRN Aline August, MD   5 mg at 10/19/22 0955   pantoprazole (PROTONIX) EC tablet 40 mg  40 mg Oral Daily Bonnielee Haff, MD   40 mg at 10/19/22 0947   pregabalin (LYRICA) capsule 75 mg  75 mg Oral TID Bonnielee Haff, MD   75 mg at 10/19/22 0948   QUEtiapine (SEROQUEL) tablet 200 mg  200 mg Oral QHS Bonnielee Haff, MD   200 mg at 10/18/22 2119   sertraline (ZOLOFT) tablet 100 mg  100 mg Oral QHS Bonnielee Haff, MD   100 mg at 10/18/22 2120   sucralfate (CARAFATE) tablet 1 g  1 g Oral QID Bonnielee Haff, MD   1 g at 10/19/22 0948   tiZANidine (ZANAFLEX) tablet 2 mg  2 mg Oral Q8H PRN Bonnielee Haff, MD       valbenazine [facility]) capsule 80 mg  80 mg Oral Daily Bonnielee Haff, MD   80 mg at 10/19/22 1610     Discharge Medications: Please see discharge summary for a list of discharge medications.  Relevant Imaging Results:  Relevant Lab Results:   Additional Information SSN  [id number]  Vassie Moselle, LCSW

## 2022-10-19 NOTE — Discharge Summary (Signed)
Physician Discharge Summary  Danielle NeedsGwendolyn [name] ZOX:[id number] DOB: 02-28-1951 DOA: 10/15/2022  PCP: [facility]  Admit date: 10/15/2022 Discharge date: 10/19/2022  Admitted From: ALF Disposition: ALF  Recommendations for Outpatient Follow-up:  Follow up with PCP in 1 week with repeat CBC/BMP Outpatient follow-up with psychiatry Follow up in ED if symptoms worsen or new appear   Home Health: No Equipment/Devices: None  Discharge Condition: Stable CODE STATUS: Full Diet recommendation: Heart healthy  Brief/Interim Summary: 72 y.o. female with a past medical history of paranoid schizophrenia, asthma, essential hypertension, bipolar disorder who lives in an assisted living facility presented with shortness of breath and cough and was found to be saturating in the 80s on room air on presentation.  Influenza, RSV and COVID-19 PCR negative.  Chest x-ray did not show any infiltrate.  She was started on IV steroids and nebulizers and inhaled steroids.  During the hospitalization, her condition has improved.  She is currently on room air.  She will be discharged home with a on oral prednisone.  Outpatient follow-up with PCP.   Discharge Diagnoses:   COPD exacerbation Acute respiratory failure with hypoxia Tobacco abuse, ongoing -Currently on room air.   - Respiratory panel PCR positive for rhinovirus -Treated with IV Solu-Medrol and nebs.   -Patient was counseled regarding tobacco cessation -He is much better.  Discharge patient home today on oral prednisone 40 mg daily for 7 days.  Continue albuterol as needed.  Also start Symbicort on discharge. -Will need outpatient pulmonary evaluation and follow-up   Nausea and vomiting -Possibly from above, triggered by respiratory illness.  Abdominal exam is benign and LFTs were normal on presentation. -Improved.  Currently tolerating diet.   Essential hypertension -Blood pressure stable.  Continue amlodipine   History of  psychiatric disorders -Her home regimen consisting of multiple psychotropic agents have been continued.  Outpatient follow-up with psychiatry.   Hyperlipidemia -Continue statin   Hypokalemia -Improved  Discharge Instructions  Discharge Instructions     Diet - low sodium heart healthy   Complete by: As directed    Increase activity slowly   Complete by: As directed       Allergies as of 10/19/2022       Reactions   Clonazepam Swelling, Other (See Comments)   Pt was recently given a triamterene hydrochlorothiazide combination as well as clonazepam and had an allergic reaction to one of them. Caused swelling of face and eyes   Doxycycline Swelling, Other (See Comments)   Swelling of face and eyes   Fosinopril Sodium Swelling, Other (See Comments)   Swelling face,lips-angioedema from ACE I   Hydrochlorothiazide W-triamterene Swelling   Pt was recently given a triamterene hydrochlorothiazide combination as well as clonazepam and had an allergic reaction to one of them. Swelling of face and eyes   Penicillins Swelling, Other (See Comments)   Face and eyes Has patient had a PCN reaction causing immediate rash, facial/tongue/throat swelling, SOB or lightheadedness with hypotension: Yes Has patient had a PCN reaction causing severe rash involving mucus membranes or skin necrosis: No Has patient had a PCN reaction that required hospitalization: No Has patient had a PCN reaction occurring within the last 10 years: No If all of the above answers are "NO", then may proceed with Cephalosporin use.   Sulfa Antibiotics Other (See Comments)   "Allergic," per [facility]   Triamterene Other (See Comments)   Pt was recently given a triamterene hydrochlorothiazide combination as well as clonazepam and had an allergic reaction to  one of them. "Allergic," per [facility]        Medication List     STOP taking these medications    cetirizine 10 MG tablet Commonly known as: ZYRTEC    HYDROcodone-acetaminophen 10-325 MG tablet Commonly known as: NORCO   pantoprazole 40 MG tablet Commonly known as: PROTONIX   sucralfate 1 g tablet Commonly known as: Carafate       TAKE these medications    albuterol (2.5 MG/3ML) 0.083% nebulizer solution Commonly known as: PROVENTIL Take 2.5 mg by nebulization every 6 (six) hours as needed for shortness of breath.   albuterol 108 (90 Base) MCG/ACT inhaler Commonly known as: VENTOLIN HFA Inhale 1-2 puffs into the lungs every 4 (four) hours as needed for wheezing or shortness of breath (or cough).   amLODipine 5 MG tablet Commonly known as: NORVASC Take 5 mg by mouth daily.   atorvastatin 40 MG tablet Commonly known as: LIPITOR Take 40 mg by mouth at bedtime.   budesonide-formoterol 160-4.5 MCG/ACT inhaler Commonly known as: Symbicort Inhale 2 puffs into the lungs 2 (two) times daily.   DHEA 25 MG Caps Take 25 mg by mouth in the morning.   hydrOXYzine 25 MG capsule Commonly known as: VISTARIL Take 25 mg by mouth 2 (two) times daily as needed for anxiety.   Ingrezza 80 MG capsule Generic drug: valbenazine Take 80 mg by mouth daily.   loratadine 10 MG tablet Commonly known as: CLARITIN Take 10 mg by mouth daily.   nicotine 21 mg/24hr patch Commonly known as: NICODERM CQ - dosed in mg/24 hours Place 1 patch (21 mg total) onto the skin daily.   predniSONE 20 MG tablet Commonly known as: DELTASONE Take 2 tablets (40 mg total) by mouth daily with breakfast for 7 days. Start taking on: October 20, 2022   pregabalin 75 MG capsule Commonly known as: LYRICA Take 75 mg by mouth in the morning, at noon, in the evening, and at bedtime.   QUEtiapine 200 MG tablet Commonly known as: SEROQUEL Take 200 mg by mouth at bedtime.   sertraline 100 MG tablet Commonly known as: ZOLOFT Take 100 mg by mouth daily.   tiZANidine 2 MG tablet Commonly known as: ZANAFLEX Take 2 mg by mouth every 8 (eight) hours as needed for  muscle spasms.   triamcinolone cream 0.1 % Commonly known as: KENALOG Apply 1 g topically See admin instructions. Apply (1 grams) green pea-sized bead to popliteal fossae twice a day   Vitamin D3 250 MCG (10000 UT) Tabs Take 10,000 Units by mouth daily.        Follow-up [facility]. Schedule an appointment as soon as possible for a visit in 1 week(s).   Contact information: [address] [place] [address] [phone]                Allergies  Allergen Reactions   Clonazepam Swelling and Other (See Comments)    Pt was recently given a triamterene hydrochlorothiazide combination as well as clonazepam and had an allergic reaction to one of them. Caused swelling of face and eyes   Doxycycline Swelling and Other (See Comments)    Swelling of face and eyes   Fosinopril Sodium Swelling and Other (See Comments)    Swelling face,lips-angioedema from ACE I   Hydrochlorothiazide W-Triamterene Swelling    Pt was recently given a triamterene hydrochlorothiazide combination as well as clonazepam and had an allergic reaction to one of them.  Swelling of face and eyes   Penicillins Swelling and Other (See Comments)    Face and eyes Has patient had a PCN reaction causing immediate rash, facial/tongue/throat swelling, SOB or lightheadedness with hypotension: Yes Has patient had a PCN reaction causing severe rash involving mucus membranes or skin necrosis: No Has patient had a PCN reaction that required hospitalization: No Has patient had a PCN reaction occurring within the last 10 years: No If all of the above answers are "NO", then may proceed with Cephalosporin use.    Sulfa Antibiotics Other (See Comments)    "Allergic," per [facility]   Triamterene Other (See Comments)    Pt was recently given a triamterene hydrochlorothiazide combination as well as clonazepam and had an allergic reaction to one of them. "Allergic," per [facility]     Consultations: None   Procedures/Studies: DG Chest 2 View  Result Date: 10/15/2022 CLINICAL DATA:  Shortness of breath. EXAM: CHEST - 2 VIEW COMPARISON:  AP chest 05/11/2022, 12/29/2020; chest two views 12/02/2019; CT chest 04/30/2020 FINDINGS: Cardiac silhouette is again mildly enlarged. Mediastinal contours are within limits. Mild bilateral interstitial thickening appears chronic. It is difficult to exclude an element of minimal interstitial pulmonary edema. No pleural effusion or pneumothorax. Mild multilevel degenerative disc changes of the mid to lower thoracic spine with multilevel anterior bridging osteophytes. IMPRESSION: Mild bilateral interstitial thickening appears chronic. It is difficult to exclude an element of minimal superimposed interstitial pulmonary edema. Electronically Signed   By: Neita Garnet M.D.   On: 10/15/2022 16:03      Subjective: Patient seen and examined at bedside.  Feels much better and he is okay to be discharged.  Discharge Exam: Vitals:   10/18/22 2241 10/19/22 0923  BP: 134/85   Pulse: 88   Resp: 20   Temp: 98.4 F (36.9 C)   SpO2: 94% 95%    General: Pt is alert, awake, not in acute distress.  On room air.  Looks chronically ill and deconditioned.  Slow to respond.  Poor historian. Cardiovascular: rate controlled, S1/S2 + Respiratory: bilateral decreased breath sounds at bases with some scattered crackles Abdominal: Soft, NT, ND, bowel sounds + Extremities: Trace lower extremity edema; no cyanosis    The results of significant diagnostics from this hospitalization (including imaging, microbiology, ancillary and laboratory) are listed below for reference.     Microbiology: Recent Results (from the past 240 hour(s))  Resp panel by RT-PCR (RSV, Flu A&B, Covid) Anterior Nasal Swab     Status: None   Collection Time: 10/15/22  4:10 PM   Specimen: Anterior Nasal Swab  Result Value Ref Range Status   SARS Coronavirus 2 by RT PCR  NEGATIVE NEGATIVE Final    Comment: (NOTE) SARS-CoV-2 target nucleic acids are NOT DETECTED.  The SARS-CoV-2 RNA is generally detectable in upper respiratory specimens during the acute phase of infection. The lowest concentration of SARS-CoV-2 viral copies this assay can detect is 138 copies/mL. A negative result does not preclude SARS-Cov-2 infection and should not be used as the sole basis for treatment or other patient management decisions. A negative result may occur with  improper specimen collection/handling, submission of specimen other than nasopharyngeal swab, presence of viral mutation(s) within the areas targeted by this assay, and inadequate number of viral copies(<138 copies/mL). A negative result must be combined with clinical observations, patient history, and epidemiological information. The expected result is Negative.  Fact Sheet for Patients:  BloggerCourse.com  Fact Sheet for Healthcare Providers:  SeriousBroker.it  This  test is no t yet approved or cleared by the [place] and  has been authorized for detection and/or diagnosis of SARS-CoV-2 by FDA under an Emergency Use Authorization (EUA). This EUA will remain  in effect (meaning this test can be used) for the duration of the COVID-19 declaration under Section 564(b)(1) of the Act, 21 U.S.C.section 360bbb-3(b)(1), unless the authorization is terminated  or revoked sooner.       Influenza A by PCR NEGATIVE NEGATIVE Final   Influenza B by PCR NEGATIVE NEGATIVE Final    Comment: (NOTE) The Xpert Xpress SARS-CoV-2/FLU/RSV plus assay is intended as an aid in the diagnosis of influenza from Nasopharyngeal swab specimens and should not be used as a sole basis for treatment. Nasal washings and aspirates are unacceptable for Xpert Xpress SARS-CoV-2/FLU/RSV testing.  Fact Sheet for Patients: BloggerCourse.com  Fact Sheet for  Healthcare Providers: SeriousBroker.it  This test is not yet approved or cleared by the [place] FDA and has been authorized for detection and/or diagnosis of SARS-CoV-2 by FDA under an Emergency Use Authorization (EUA). This EUA will remain in effect (meaning this test can be used) for the duration of the COVID-19 declaration under Section 564(b)(1) of the Act, 21 U.S.C. section 360bbb-3(b)(1), unless the authorization is terminated or revoked.     Resp Syncytial Virus by PCR NEGATIVE NEGATIVE Final    Comment: (NOTE) Fact Sheet for Patients: BloggerCourse.com  Fact Sheet for Healthcare Providers: SeriousBroker.it  This test is not yet approved or cleared by the [place] FDA and has been authorized for detection and/or diagnosis of SARS-CoV-2 by FDA under an Emergency Use Authorization (EUA). This EUA will remain in effect (meaning this test can be used) for the duration of the COVID-19 declaration under Section 564(b)(1) of the Act, 21 U.S.C. section 360bbb-3(b)(1), unless the authorization is terminated or revoked.  Performed at [facility], [address], [place], [place] [address]   Respiratory (~20 pathogens) panel by PCR     Status: Abnormal   Collection Time: 10/16/22 11:44 AM   Specimen: Nasopharyngeal Swab; Respiratory  Result Value Ref Range Status   Adenovirus NOT DETECTED NOT DETECTED Final   Coronavirus 229E NOT DETECTED NOT DETECTED Final    Comment: (NOTE) The Coronavirus on the Respiratory Panel, DOES NOT test for the novel  Coronavirus (2019 nCoV)    Coronavirus HKU1 NOT DETECTED NOT DETECTED Final   Coronavirus NL63 NOT DETECTED NOT DETECTED Final   Coronavirus OC43 NOT DETECTED NOT DETECTED Final   Metapneumovirus NOT DETECTED NOT DETECTED Final   Rhinovirus / Enterovirus DETECTED (A) NOT DETECTED Final   Influenza A NOT DETECTED NOT DETECTED Final    Influenza B NOT DETECTED NOT DETECTED Final   Parainfluenza Virus 1 NOT DETECTED NOT DETECTED Final   Parainfluenza Virus 2 NOT DETECTED NOT DETECTED Final   Parainfluenza Virus 3 NOT DETECTED NOT DETECTED Final   Parainfluenza Virus 4 NOT DETECTED NOT DETECTED Final   Respiratory Syncytial Virus NOT DETECTED NOT DETECTED Final   Bordetella pertussis NOT DETECTED NOT DETECTED Final   Bordetella Parapertussis NOT DETECTED NOT DETECTED Final   Chlamydophila pneumoniae NOT DETECTED NOT DETECTED Final   Mycoplasma pneumoniae NOT DETECTED NOT DETECTED Final    Comment: Performed at [facility], [address] [place], [place] [address]     Labs: BNP (last 3 results) Recent Labs    10/15/22 1610  BNP 48.2   Basic Metabolic Panel: Recent Labs  Lab 10/15/22  1610 10/16/22 0351 10/18/22 0711  NA 141 139 138  K 3.6 3.0* 3.6  CL 106 110 104  CO2 28 21* 22  GLUCOSE 103* 152* 117*  BUN 15 16 26*  CREATININE 0.80 0.59 0.67  CALCIUM 10.0 8.7* 9.8  MG  --   --  2.2   Liver Function Tests: Recent Labs  Lab 10/15/22 1610  AST 21  ALT 16  ALKPHOS 65  BILITOT 0.7  PROT 7.9  ALBUMIN 4.3   No results for input(s): "LIPASE", "AMYLASE" in the last 168 hours. No results for input(s): "AMMONIA" in the last 168 hours. CBC: Recent Labs  Lab 10/15/22 1610 10/16/22 0351  WBC 10.8* 6.8  NEUTROABS 7.9*  --   HGB 13.5 12.2  HCT 41.3 37.5  MCV 88.2 88.7  PLT 272 247   Cardiac Enzymes: No results for input(s): "CKTOTAL", "CKMB", "CKMBINDEX", "TROPONINI" in the last 168 hours. BNP: Invalid input(s): "POCBNP" CBG: No results for input(s): "GLUCAP" in the last 168 hours. D-Dimer No results for input(s): "DDIMER" in the last 72 hours. Hgb A1c No results for input(s): "HGBA1C" in the last 72 hours. Lipid Profile No results for input(s): "CHOL", "HDL", "LDLCALC", "TRIG", "CHOLHDL", "LDLDIRECT" in the last 72 hours. Thyroid function studies No results for input(s): "TSH",  "T4TOTAL", "T3FREE", "THYROIDAB" in the last 72 hours.  Invalid input(s): "FREET3" Anemia work up No results for input(s): "VITAMINB12", "FOLATE", "FERRITIN", "TIBC", "IRON", "RETICCTPCT" in the last 72 hours. Urinalysis    Component Value Date/Time   COLORURINE YELLOW 05/03/2022 1534   APPEARANCEUR HAZY (A) 05/03/2022 1534   LABSPEC 1.030 05/03/2022 1534   PHURINE 5.0 05/03/2022 1534   GLUCOSEU NEGATIVE 05/03/2022 1534   HGBUR NEGATIVE 05/03/2022 1534   HGBUR negative 10/25/2008 0936   BILIRUBINUR NEGATIVE 05/03/2022 1534   KETONESUR NEGATIVE 05/03/2022 1534   PROTEINUR NEGATIVE 05/03/2022 1534   UROBILINOGEN 0.2 03/23/2018 1531   NITRITE NEGATIVE 05/03/2022 1534   LEUKOCYTESUR LARGE (A) 05/03/2022 1534   Sepsis Labs Recent Labs  Lab 10/15/22 1610 10/16/22 0351  WBC 10.8* 6.8   Microbiology Recent Results (from the past 240 hour(s))  Resp panel by RT-PCR (RSV, Flu A&B, Covid) Anterior Nasal Swab     Status: None   Collection Time: 10/15/22  4:10 PM   Specimen: Anterior Nasal Swab  Result Value Ref Range Status   SARS Coronavirus 2 by RT PCR NEGATIVE NEGATIVE Final    Comment: (NOTE) SARS-CoV-2 target nucleic acids are NOT DETECTED.  The SARS-CoV-2 RNA is generally detectable in upper respiratory specimens during the acute phase of infection. The lowest concentration of SARS-CoV-2 viral copies this assay can detect is 138 copies/mL. A negative result does not preclude SARS-Cov-2 infection and should not be used as the sole basis for treatment or other patient management decisions. A negative result may occur with  improper specimen collection/handling, submission of specimen other than nasopharyngeal swab, presence of viral mutation(s) within the areas targeted by this assay, and inadequate number of viral copies(<138 copies/mL). A negative result must be combined with clinical observations, patient history, and epidemiological information. The expected result is  Negative.  Fact Sheet for Patients:  BloggerCourse.com  Fact Sheet for Healthcare Providers:  SeriousBroker.it  This test is no t yet approved or cleared by the [place] FDA and  has been authorized for detection and/or diagnosis of SARS-CoV-2 by FDA under an Emergency Use Authorization (EUA). This EUA will remain  in effect (meaning this test can be used) for the  duration of the COVID-19 declaration under Section 564(b)(1) of the Act, 21 U.S.C.section 360bbb-3(b)(1), unless the authorization is terminated  or revoked sooner.       Influenza A by PCR NEGATIVE NEGATIVE Final   Influenza B by PCR NEGATIVE NEGATIVE Final    Comment: (NOTE) The Xpert Xpress SARS-CoV-2/FLU/RSV plus assay is intended as an aid in the diagnosis of influenza from Nasopharyngeal swab specimens and should not be used as a sole basis for treatment. Nasal washings and aspirates are unacceptable for Xpert Xpress SARS-CoV-2/FLU/RSV testing.  Fact Sheet for Patients: EntrepreneurPulse.com.au  Fact Sheet for Healthcare Providers: IncredibleEmployment.be  This test is not yet approved or cleared by the [place] FDA and has been authorized for detection and/or diagnosis of SARS-CoV-2 by FDA under an Emergency Use Authorization (EUA). This EUA will remain in effect (meaning this test can be used) for the duration of the COVID-19 declaration under Section 564(b)(1) of the Act, 21 U.S.C. section 360bbb-3(b)(1), unless the authorization is terminated or revoked.     Resp Syncytial Virus by PCR NEGATIVE NEGATIVE Final    Comment: (NOTE) Fact Sheet for Patients: EntrepreneurPulse.com.au  Fact Sheet for Healthcare Providers: IncredibleEmployment.be  This test is not yet approved or cleared by the [place] FDA and has been authorized for detection and/or diagnosis of  SARS-CoV-2 by FDA under an Emergency Use Authorization (EUA). This EUA will remain in effect (meaning this test can be used) for the duration of the COVID-19 declaration under Section 564(b)(1) of the Act, 21 U.S.C. section 360bbb-3(b)(1), unless the authorization is terminated or revoked.  Performed at [facility], [place] [place], [place] [address]   Respiratory (~20 pathogens) panel by PCR     Status: Abnormal   Collection Time: 10/16/22 11:44 AM   Specimen: Nasopharyngeal Swab; Respiratory  Result Value Ref Range Status   Adenovirus NOT DETECTED NOT DETECTED Final   Coronavirus 229E NOT DETECTED NOT DETECTED Final    Comment: (NOTE) The Coronavirus on the Respiratory Panel, DOES NOT test for the novel  Coronavirus (2019 nCoV)    Coronavirus HKU1 NOT DETECTED NOT DETECTED Final   Coronavirus NL63 NOT DETECTED NOT DETECTED Final   Coronavirus OC43 NOT DETECTED NOT DETECTED Final   Metapneumovirus NOT DETECTED NOT DETECTED Final   Rhinovirus / Enterovirus DETECTED (A) NOT DETECTED Final   Influenza A NOT DETECTED NOT DETECTED Final   Influenza B NOT DETECTED NOT DETECTED Final   Parainfluenza Virus 1 NOT DETECTED NOT DETECTED Final   Parainfluenza Virus 2 NOT DETECTED NOT DETECTED Final   Parainfluenza Virus 3 NOT DETECTED NOT DETECTED Final   Parainfluenza Virus 4 NOT DETECTED NOT DETECTED Final   Respiratory Syncytial Virus NOT DETECTED NOT DETECTED Final   Bordetella pertussis NOT DETECTED NOT DETECTED Final   Bordetella Parapertussis NOT DETECTED NOT DETECTED Final   Chlamydophila pneumoniae NOT DETECTED NOT DETECTED Final   Mycoplasma pneumoniae NOT DETECTED NOT DETECTED Final    Comment: Performed at [facility], [address], [place], [place] [address]     Time coordinating discharge: 35 minutes  SIGNED:   Aline August, MD  [facility] 10/19/2022, 10:50 AM

## 2022-11-04 ENCOUNTER — Telehealth: Payer: Self-pay | Admitting: Gastroenterology

## 2022-11-04 NOTE — Telephone Encounter (Signed)
Danielle [name] from [facility] called, requesting to speak to Dr. Steve Rattler nurse. Stated she had a question about the Quetiapine and chlarithromycin. Stated the physician had only ordered a partial dose not a full dose and wanted to know the reason why they were ordered. Requested a call back at [phone].   Please advise.

## 2022-11-05 NOTE — Telephone Encounter (Signed)
Left message for Danielle [name] to call back.   Direct number provided:

## 2022-11-05 NOTE — Telephone Encounter (Signed)
Spoke with [employer].  All questions answered.

## 2023-05-26 ENCOUNTER — Other Ambulatory Visit (HOSPITAL_COMMUNITY): Payer: Self-pay

## 2023-05-26 MED ORDER — INGREZZA 80 MG PO CAPS
80.0000 mg | ORAL_CAPSULE | Freq: Every day | ORAL | 11 refills | Status: AC
  Filled 2023-05-26: qty 30, 30d supply, fill #0

## 2023-05-27 ENCOUNTER — Other Ambulatory Visit (HOSPITAL_COMMUNITY): Payer: Self-pay

## 2023-06-01 ENCOUNTER — Other Ambulatory Visit (HOSPITAL_COMMUNITY): Payer: Self-pay

## 2023-08-07 ENCOUNTER — Emergency Department (HOSPITAL_COMMUNITY): Payer: 59

## 2023-08-07 ENCOUNTER — Other Ambulatory Visit: Payer: Self-pay

## 2023-08-07 ENCOUNTER — Inpatient Hospital Stay (HOSPITAL_COMMUNITY)
Admission: EM | Admit: 2023-08-07 | Discharge: 2023-08-15 | DRG: 189 | Disposition: A | Discharge status: Nursing Home (Includes ICF) | Payer: 59 | Source: Skilled Nursing Facility | Attending: Internal Medicine | Admitting: Internal Medicine

## 2023-08-07 DIAGNOSIS — F209 Schizophrenia, unspecified: Secondary | ICD-10-CM | POA: Diagnosis present

## 2023-08-07 DIAGNOSIS — Z720 Tobacco use: Secondary | ICD-10-CM | POA: Diagnosis present

## 2023-08-07 DIAGNOSIS — E669 Obesity, unspecified: Secondary | ICD-10-CM | POA: Diagnosis present

## 2023-08-07 DIAGNOSIS — K219 Gastro-esophageal reflux disease without esophagitis: Secondary | ICD-10-CM | POA: Diagnosis present

## 2023-08-07 DIAGNOSIS — B9789 Other viral agents as the cause of diseases classified elsewhere: Secondary | ICD-10-CM | POA: Diagnosis present

## 2023-08-07 DIAGNOSIS — F1910 Other psychoactive substance abuse, uncomplicated: Secondary | ICD-10-CM | POA: Diagnosis present

## 2023-08-07 DIAGNOSIS — E663 Overweight: Secondary | ICD-10-CM | POA: Diagnosis present

## 2023-08-07 DIAGNOSIS — I2510 Atherosclerotic heart disease of native coronary artery without angina pectoris: Secondary | ICD-10-CM | POA: Diagnosis present

## 2023-08-07 DIAGNOSIS — F17210 Nicotine dependence, cigarettes, uncomplicated: Secondary | ICD-10-CM | POA: Diagnosis present

## 2023-08-07 DIAGNOSIS — E785 Hyperlipidemia, unspecified: Secondary | ICD-10-CM | POA: Diagnosis present

## 2023-08-07 DIAGNOSIS — J9601 Acute respiratory failure with hypoxia: Secondary | ICD-10-CM | POA: Diagnosis not present

## 2023-08-07 DIAGNOSIS — Z79899 Other long term (current) drug therapy: Secondary | ICD-10-CM

## 2023-08-07 DIAGNOSIS — Z7951 Long term (current) use of inhaled steroids: Secondary | ICD-10-CM

## 2023-08-07 DIAGNOSIS — F411 Generalized anxiety disorder: Secondary | ICD-10-CM | POA: Diagnosis present

## 2023-08-07 DIAGNOSIS — I10 Essential (primary) hypertension: Secondary | ICD-10-CM | POA: Diagnosis present

## 2023-08-07 DIAGNOSIS — R0600 Dyspnea, unspecified: Secondary | ICD-10-CM | POA: Diagnosis not present

## 2023-08-07 DIAGNOSIS — Z881 Allergy status to other antibiotic agents status: Secondary | ICD-10-CM

## 2023-08-07 DIAGNOSIS — E872 Acidosis, unspecified: Secondary | ICD-10-CM | POA: Diagnosis present

## 2023-08-07 DIAGNOSIS — Z6827 Body mass index (BMI) 27.0-27.9, adult: Secondary | ICD-10-CM

## 2023-08-07 DIAGNOSIS — Z90710 Acquired absence of both cervix and uterus: Secondary | ICD-10-CM

## 2023-08-07 DIAGNOSIS — F1994 Other psychoactive substance use, unspecified with psychoactive substance-induced mood disorder: Secondary | ICD-10-CM | POA: Diagnosis present

## 2023-08-07 DIAGNOSIS — Z8249 Family history of ischemic heart disease and other diseases of the circulatory system: Secondary | ICD-10-CM | POA: Diagnosis not present

## 2023-08-07 DIAGNOSIS — T380X5A Adverse effect of glucocorticoids and synthetic analogues, initial encounter: Secondary | ICD-10-CM | POA: Diagnosis not present

## 2023-08-07 DIAGNOSIS — E860 Dehydration: Secondary | ICD-10-CM | POA: Diagnosis present

## 2023-08-07 DIAGNOSIS — Z823 Family history of stroke: Secondary | ICD-10-CM

## 2023-08-07 DIAGNOSIS — E876 Hypokalemia: Secondary | ICD-10-CM | POA: Diagnosis present

## 2023-08-07 DIAGNOSIS — D72829 Elevated white blood cell count, unspecified: Secondary | ICD-10-CM | POA: Diagnosis not present

## 2023-08-07 DIAGNOSIS — Z1152 Encounter for screening for COVID-19: Secondary | ICD-10-CM | POA: Diagnosis not present

## 2023-08-07 DIAGNOSIS — Z713 Dietary counseling and surveillance: Secondary | ICD-10-CM

## 2023-08-07 DIAGNOSIS — J441 Chronic obstructive pulmonary disease with (acute) exacerbation: Principal | ICD-10-CM | POA: Diagnosis present

## 2023-08-07 DIAGNOSIS — B348 Other viral infections of unspecified site: Secondary | ICD-10-CM | POA: Diagnosis not present

## 2023-08-07 DIAGNOSIS — J204 Acute bronchitis due to parainfluenza virus: Secondary | ICD-10-CM | POA: Diagnosis not present

## 2023-08-07 DIAGNOSIS — Z888 Allergy status to other drugs, medicaments and biological substances status: Secondary | ICD-10-CM

## 2023-08-07 DIAGNOSIS — Z880 Allergy status to penicillin: Secondary | ICD-10-CM

## 2023-08-07 DIAGNOSIS — Z716 Tobacco abuse counseling: Secondary | ICD-10-CM

## 2023-08-07 DIAGNOSIS — Z882 Allergy status to sulfonamides status: Secondary | ICD-10-CM

## 2023-08-07 DIAGNOSIS — F319 Bipolar disorder, unspecified: Secondary | ICD-10-CM | POA: Diagnosis present

## 2023-08-07 DIAGNOSIS — Z8673 Personal history of transient ischemic attack (TIA), and cerebral infarction without residual deficits: Secondary | ICD-10-CM

## 2023-08-07 LAB — BLOOD GAS, VENOUS
Acid-Base Excess: 5 mmol/L — ABNORMAL HIGH (ref 0.0–2.0)
Bicarbonate: 29.9 mmol/L — ABNORMAL HIGH (ref 20.0–28.0)
O2 Saturation: 99.9 %
Patient temperature: 37
pCO2, Ven: 44 mm[Hg] (ref 44–60)
pH, Ven: 7.44 — ABNORMAL HIGH (ref 7.25–7.43)
pO2, Ven: 94 mm[Hg] — ABNORMAL HIGH (ref 32–45)

## 2023-08-07 LAB — CBC WITH DIFFERENTIAL/PLATELET
Abs Immature Granulocytes: 0.03 10*3/uL (ref 0.00–0.07)
Basophils Absolute: 0 10*3/uL (ref 0.0–0.1)
Basophils Relative: 0 %
Eosinophils Absolute: 0.3 10*3/uL (ref 0.0–0.5)
Eosinophils Relative: 4 %
HCT: 39.9 % (ref 36.0–46.0)
Hemoglobin: 13.4 g/dL (ref 12.0–15.0)
Immature Granulocytes: 0 %
Lymphocytes Relative: 35 %
Lymphs Abs: 2.9 10*3/uL (ref 0.7–4.0)
MCH: 31.1 pg (ref 26.0–34.0)
MCHC: 33.6 g/dL (ref 30.0–36.0)
MCV: 92.6 fL (ref 80.0–100.0)
Monocytes Absolute: 1.2 10*3/uL — ABNORMAL HIGH (ref 0.1–1.0)
Monocytes Relative: 15 %
Neutro Abs: 3.8 10*3/uL (ref 1.7–7.7)
Neutrophils Relative %: 46 %
Platelets: 222 10*3/uL (ref 150–400)
RBC: 4.31 MIL/uL (ref 3.87–5.11)
RDW: 14.5 % (ref 11.5–15.5)
WBC: 8.3 10*3/uL (ref 4.0–10.5)
nRBC: 0 % (ref 0.0–0.2)

## 2023-08-07 LAB — COMPREHENSIVE METABOLIC PANEL
ALT: 13 U/L (ref 0–44)
AST: 20 U/L (ref 15–41)
Albumin: 3.8 g/dL (ref 3.5–5.0)
Alkaline Phosphatase: 64 U/L (ref 38–126)
Anion gap: 10 (ref 5–15)
BUN: 12 mg/dL (ref 8–23)
CO2: 24 mmol/L (ref 22–32)
Calcium: 9.1 mg/dL (ref 8.9–10.3)
Chloride: 104 mmol/L (ref 98–111)
Creatinine, Ser: 0.81 mg/dL (ref 0.44–1.00)
GFR, Estimated: >60 mL/min (ref >60)
Glucose, Bld: 125 mg/dL — ABNORMAL HIGH (ref 70–99)
Potassium: 3.2 mmol/L — ABNORMAL LOW (ref 3.5–5.1)
Sodium: 138 mmol/L (ref 135–145)
Total Bilirubin: 0.5 mg/dL (ref 0.3–1.2)
Total Protein: 6.6 g/dL (ref 6.5–8.1)

## 2023-08-07 LAB — TROPONIN I (HIGH SENSITIVITY)
Troponin I (High Sensitivity): 11 ng/L (ref <18)
Troponin I (High Sensitivity): 6 ng/L (ref <18)

## 2023-08-07 LAB — RESP PANEL BY RT-PCR (RSV, FLU A&B, COVID)  RVPGX2
Influenza A by PCR: NEGATIVE
Influenza B by PCR: NEGATIVE
Resp Syncytial Virus by PCR: NEGATIVE
SARS Coronavirus 2 by RT PCR: NEGATIVE

## 2023-08-07 LAB — CG4 I-STAT (LACTIC ACID): Lactic Acid, Venous: 3.7 mmol/L (ref 0.5–1.9)

## 2023-08-07 LAB — I-STAT CG4 LACTIC ACID, ED: Lactic Acid, Venous: 2.5 mmol/L (ref 0.5–1.9)

## 2023-08-07 LAB — BRAIN NATRIURETIC PEPTIDE: B Natriuretic Peptide: 85.8 pg/mL (ref 0.0–100.0)

## 2023-08-07 MED ORDER — SODIUM CHLORIDE 0.9 % IV BOLUS
500.0000 mL | Freq: Once | INTRAVENOUS | Status: AC
  Administered 2023-08-07: 500 mL via INTRAVENOUS

## 2023-08-07 NOTE — ED Provider Notes (Signed)
[place] EMERGENCY DEPARTMENT AT [facility] Provider Note   CSN: [id number] Arrival date & time: 08/07/23  1833     History  Chief Complaint  Patient presents with   Shortness of Breath    Danielle [name] is a 72 y.o. female.  72 year old female with prior medical history as detailed below presents with EMS transport from [place] where she resides.  Patient with increased wheezing and shortness of breath x 2 to 3 days.  Per EMS, patient was hypoxic on room air down to the 70s.  Patient with significant wheezing throughout all lung fields initially.  Patient was given magnesium, 125 mg Solu-Medrol, and breathing treatments during transport.  Patient reports that she feels improved but still wheezing on arrival.  Patient denies recent fever.  She denies chest pain.  Patient is alert and answering questions appropriately evaluation.  The history is provided by the patient and medical records.       Home Medications Prior to Admission medications   Medication Sig Start Date End Date Taking? Authorizing Provider  albuterol (PROVENTIL) (2.5 MG/3ML) 0.083% nebulizer solution Take 2.5 mg by nebulization every 6 (six) hours as needed for shortness of breath.    [provider]  albuterol (VENTOLIN HFA) 108 (90 Base) MCG/ACT inhaler Inhale 1-2 puffs into the lungs every 4 (four) hours as needed for wheezing or shortness of breath (or cough). 10/19/22   Glade Lloyd, MD  amLODipine (NORVASC) 5 MG tablet Take 5 mg by mouth daily.    [provider]  atorvastatin (LIPITOR) 40 MG tablet Take 40 mg by mouth at bedtime.    [provider]  budesonide-formoterol (SYMBICORT) 160-4.5 MCG/ACT inhaler Inhale 2 puffs into the lungs 2 (two) times daily. 10/19/22   Glade Lloyd, MD  Cholecalciferol (VITAMIN D3) 250 MCG (10000 UT) TABS Take 10,000 Units by mouth daily.    [provider]  DHEA 25 MG CAPS Take 25 mg by mouth in the morning.     [provider]  hydrOXYzine (VISTARIL) 25 MG capsule Take 25 mg by mouth 2 (two) times daily as needed for anxiety.    [provider]  INGREZZA 80 MG capsule Take 80 mg by mouth daily. 04/01/22   [provider]  loratadine (CLARITIN) 10 MG tablet Take 10 mg by mouth daily.    [provider]  nicotine (NICODERM CQ - DOSED IN MG/24 HOURS) 21 mg/24hr patch Place 1 patch (21 mg total) onto the skin daily. 05/15/22   Dorcas Carrow, MD  pregabalin (LYRICA) 75 MG capsule Take 75 mg by mouth in the morning, at noon, in the evening, and at bedtime. 04/07/22   [provider]  QUEtiapine (SEROQUEL) 200 MG tablet Take 200 mg by mouth at bedtime. 05/06/22   [provider]  sertraline (ZOLOFT) 100 MG tablet Take 100 mg by mouth daily.    [provider]  tiZANidine (ZANAFLEX) 2 MG tablet Take 2 mg by mouth every 8 (eight) hours as needed for muscle spasms.    [provider]  triamcinolone cream (KENALOG) 0.1 % Apply 1 g topically See admin instructions. Apply (1 grams) green pea-sized bead to popliteal fossae twice a day 04/27/22   [provider]  valbenazine (INGREZZA) 80 MG capsule Take 1 capsule (80 mg total) by mouth daily. 05/26/23         Allergies    Clonazepam, Doxycycline, Fosinopril sodium, Hydrochlorothiazide w-triamterene, Penicillins, Sulfa antibiotics, and Triamterene    Review  of Systems   Review of Systems  All other systems reviewed and are negative.   Physical Exam Updated Vital Signs BP 95/64   Pulse (!) 101   Temp 98.1 F (36.7 C) (Axillary)   Resp (!) 24   SpO2 100% Comment: Simultaneous filing. User may not have seen previous data. Physical Exam Vitals and nursing note reviewed.  Constitutional:      General: She is not in acute distress.    Appearance: Normal appearance. She is well-developed.  HENT:     Head: Normocephalic and atraumatic.  Eyes:     Conjunctiva/sclera: Conjunctivae  normal.     Pupils: Pupils are equal, round, and reactive to light.  Cardiovascular:     Rate and Rhythm: Normal rate and regular rhythm.     Heart sounds: Normal heart sounds.  Pulmonary:     Effort: Pulmonary effort is normal. No respiratory distress.     Comments: Diffuse expiratory wheezes in all lung fields Abdominal:     General: There is no distension.     Palpations: Abdomen is soft.     Tenderness: There is no abdominal tenderness.  Musculoskeletal:        General: No deformity. Normal range of motion.     Cervical back: Normal range of motion and neck supple.  Skin:    General: Skin is warm and dry.  Neurological:     General: No focal deficit present.     Mental Status: She is alert and oriented to person, place, and time.     ED Results / Procedures / Treatments   Labs (all labs ordered are listed, but only abnormal results are displayed) Labs Reviewed  RESP PANEL BY RT-PCR (RSV, FLU A&B, COVID)  [id number]  CULTURE, BLOOD (ROUTINE X 2)  CULTURE, BLOOD (ROUTINE X 2)  CBC WITH DIFFERENTIAL/PLATELET  COMPREHENSIVE METABOLIC PANEL  BRAIN NATRIURETIC PEPTIDE  URINALYSIS, W/ REFLEX TO CULTURE (INFECTION SUSPECTED)  BLOOD GAS, VENOUS  I-STAT CG4 LACTIC ACID, ED  TROPONIN I (HIGH SENSITIVITY)    EKG EKG Interpretation Date/Time:  Sunday August 07 2023 18:41:34 EDT Ventricular Rate:  102 PR Interval:  147 QRS Duration:  76 QT Interval:  370 QTC Calculation: 482 R Axis:   46  Text Interpretation: Sinus tachycardia Ventricular premature complex Nonspecific T abnormalities, lateral leads Confirmed by Kristine Royal [id number]) on 08/07/2023 6:47:14 PM  Radiology No results found.  Procedures Procedures    Medications Ordered in ED Medications - No data to display  ED Course/ Medical Decision Making/ A&P                                 Medical Decision Making Amount and/or Complexity of Data Reviewed Labs: ordered. Radiology: ordered.    Medical Screen  Complete  This patient presented to the ED with complaint of dyspnea.  This complaint involves an extensive number of treatment options. The initial differential diagnosis includes, but is not limited to, COPD exacerbation, bronchospasm, metabolic abnormality, pneumonia, etc.  This presentation is: Acute, Chronic, Self-Limited, Previously Undiagnosed, Uncertain Prognosis, Complicated, Systemic Symptoms, and Threat to Life/Bodily Function  Patient with known history of paranoid schizophrenia, COPD/asthma, hypertension, bipolar, who resides at [facility] presents with significant shortness of breath.  EMS reports initial oxygen saturations down into the 70's on room air.  Patient was given breathing treatments, steroids, magnesium during transport.  Patient is improving on ED evaluation.  Screening workup suggest  COPD exacerbation.  Hospitalist service made aware of case and evaluate for admission.  Additional history obtained: External records from outside sources obtained and reviewed including prior ED visits and prior Inpatient records.    Lab Tests:  I ordered and personally interpreted labs.  The pertinent results include: CBC, CMP, BNP, VBG, lactic acid, troponin   Imaging Studies ordered:  I ordered imaging studies including chest x-ray I independently visualized and interpreted obtained imaging which showed NAD I agree with the radiologist interpretation.   Cardiac Monitoring:  The patient was maintained on a cardiac monitor.  I personally viewed and interpreted the cardiac monitor which showed an underlying rhythm of: Sinus tach then NSR   Medicines ordered:  I ordered medication including IV fluid for dehydration Reevaluation of the patient after these medicines showed that the patient: improved   Problem List / ED Course:  Dyspnea, suspected COPD exacerbation   Reevaluation:  After the interventions noted above, I reevaluated the patient and found that  they have: improved  Disposition:  After consideration of the diagnostic results and the patients response to treatment, I feel that the patent would benefit from admission.          Final Clinical Impression(s) / ED Diagnoses Final diagnoses:  Dyspnea, unspecified type    Rx / DC Orders ED Discharge Orders     None         Wynetta Fines, MD 08/07/23 2022

## 2023-08-07 NOTE — ED Notes (Signed)
ED TO INPATIENT HANDOFF REPORT  ED Nurse Name and Phone #: Marline Backbone [phone]  S Name/Age/Gender Danielle [name] 72 y.o. female Room/Bed: RESA/RESA  Code Status   Code Status: Full Code  Home/SNF/Other Skilled nursing facility Patient oriented to: self, place, time, and situation Is this baseline? Yes   Triage Complete: Triage complete  Chief Complaint Acute hypoxemic respiratory failure (HCC) [J96.01]  Triage Note Pt BIBA from [facility]. C/o SOB was 78 on RA on EMS arrival w/wheezing in all lobes. Given 50 mg mag, 10 mg albuterol, 125 solu-medrol, and atrovent. Up to 100 Spo2  AOx4   Allergies Allergies  Allergen Reactions   Clonazepam Swelling and Other (See Comments)    Pt was recently given a triamterene hydrochlorothiazide combination as well as clonazepam and had an allergic reaction to one of them. Caused swelling of face and eyes   Doxycycline Swelling and Other (See Comments)    Swelling of face and eyes   Fosinopril Sodium Swelling and Other (See Comments)    Swelling face,lips-angioedema from ACE I   Hydrochlorothiazide W-Triamterene Swelling    Pt was recently given a triamterene hydrochlorothiazide combination as well as clonazepam and had an allergic reaction to one of them. Swelling of face and eyes   Penicillins Swelling and Other (See Comments)    Face and eyes Has patient had a PCN reaction causing immediate rash, facial/tongue/throat swelling, SOB or lightheadedness with hypotension: Yes Has patient had a PCN reaction causing severe rash involving mucus membranes or skin necrosis: No Has patient had a PCN reaction that required hospitalization: No Has patient had a PCN reaction occurring within the last 10 years: No If all of the above answers are "NO", then may proceed with Cephalosporin use.    Sulfa Antibiotics Other (See Comments)    "Allergic," per MAR   Triamterene Other (See Comments)    Pt was recently given a triamterene  hydrochlorothiazide combination as well as clonazepam and had an allergic reaction to one of them. "Allergic," per MAR    Level of Care/Admitting Diagnosis ED Disposition     ED Disposition  Admit   Condition  --   Comment  Hospital Area: [facility] [[id number]]  Level of Care: Telemetry [5]  Admit to tele based on following criteria: Other see comments  Comments: Hypoxia  May admit patient to [facility] or [facility] if equivalent level of care is available:: Yes  Covid Evaluation: Symptomatic Person Under Investigation (PUI) or recent exposure (last 10 days) *Testing Required*  Diagnosis: Acute hypoxemic respiratory failure [facility]) [[id number]]  Admitting Physician: Rometta Emery [[id number]]  Attending Physician: Rometta Emery [[id number]]  Certification:: I certify this patient will need inpatient services for at least 2 midnights  Expected Medical Readiness: 08/09/2023          B Medical/Surgery History Past Medical History:  Diagnosis Date   Asthma    Bipolar affective disorder, currently depressed, mild (HCC)    Chest pain    Complication of anesthesia    " My eyes swell up "   Coronary artery disease    Depression    Fall 08/25/2012   Recent fall with residual knee and back pain.   Family history of anesthesia complication    " [name] "   GERD (gastroesophageal reflux disease)    Hypertension    Memory disturbance 01/30/2016   Stroke (HCC)    Syncope and collapse 03/21/2019   Tardive dyskinesia  Tobacco abuse    Past Surgical History:  Procedure Laterality Date   ABDOMINAL HYSTERECTOMY     BIOPSY  05/11/2022   Procedure: BIOPSY;  Surgeon: Lynann Bologna, MD;  Location: [facility] ENDOSCOPY;  Service: Gastroenterology;;   CARDIAC CATHETERIZATION     ESOPHAGOGASTRODUODENOSCOPY (EGD) WITH PROPOFOL N/A 05/11/2022   Procedure: ESOPHAGOGASTRODUODENOSCOPY (EGD) WITH PROPOFOL;  Surgeon: Lynann Bologna, MD;  Location: [facility] ENDOSCOPY;  Service:  Gastroenterology;  Laterality: N/A;   HEMOSTASIS CONTROL  05/11/2022   Procedure: HEMOSTASIS CONTROL;  Surgeon: Lynann Bologna, MD;  Location: [facility] ENDOSCOPY;  Service: Gastroenterology;;   HOT HEMOSTASIS N/A 05/11/2022   Procedure: HOT HEMOSTASIS (ARGON PLASMA COAGULATION/BICAP);  Surgeon: Lynann Bologna, MD;  Location: [facility] ENDOSCOPY;  Service: Gastroenterology;  Laterality: N/A;   RIGHT/LEFT HEART CATH AND CORONARY ANGIOGRAPHY N/A 08/14/2018   Procedure: RIGHT/LEFT HEART CATH AND CORONARY ANGIOGRAPHY;  Surgeon: Marykay Lex, MD;  Location: [facility] INVASIVE CV LAB;  Service: Cardiovascular;  Laterality: N/A;     A IV Location/Drains/Wounds Patient Lines/Drains/Airways Status     Active Line/Drains/Airways     Name Placement date Placement time Site Days   Peripheral IV 08/07/23 Left;Posterior Hand 08/07/23  1800  Hand  less than 1   Peripheral IV 08/07/23 20 G Posterior;Right Hand 08/07/23  1800  Hand  less than 1            Intake/Output Last 24 hours No intake or output data in the 24 hours ending 08/07/23 2053  Labs/Imaging Results for orders placed or performed during the hospital encounter of 08/07/23 (from the past 48 hour(s))  CBC with Differential     Status: Abnormal   Collection Time: 08/07/23  6:43 PM  Result Value Ref Range   WBC 8.3 4.0 - 10.5 K/uL   RBC 4.31 3.87 - 5.11 MIL/uL   Hemoglobin 13.4 12.0 - 15.0 g/dL   HCT 16.1 09.6 - 04.5 %   MCV 92.6 80.0 - 100.0 fL   MCH 31.1 26.0 - 34.0 pg   MCHC 33.6 30.0 - 36.0 g/dL   RDW 40.9 81.1 - 91.4 %   Platelets 222 150 - 400 K/uL   nRBC 0.0 0.0 - 0.2 %   Neutrophils Relative % 46 %   Neutro Abs 3.8 1.7 - 7.7 K/uL   Lymphocytes Relative 35 %   Lymphs Abs 2.9 0.7 - 4.0 K/uL   Monocytes Relative 15 %   Monocytes Absolute 1.2 (H) 0.1 - 1.0 K/uL   Eosinophils Relative 4 %   Eosinophils Absolute 0.3 0.0 - 0.5 K/uL   Basophils Relative 0 %   Basophils Absolute 0.0 0.0 - 0.1 K/uL   Immature Granulocytes 0 %   Abs Immature  Granulocytes 0.03 0.00 - 0.07 K/uL    Comment: Performed at [facility], [address] [place], [place] [address]  Troponin I (High Sensitivity)     Status: None   Collection Time: 08/07/23  6:43 PM  Result Value Ref Range   Troponin I (High Sensitivity) 11 <18 ng/L    Comment: (NOTE) Elevated high sensitivity troponin I (hsTnI) values and significant  changes across serial measurements may suggest ACS but many other  chronic and acute conditions are known to elevate hsTnI results.  Refer to the "Links" section for chest pain algorithms and additional  guidance. Performed at [facility], [address] [place], [place] [address]   Comprehensive metabolic panel     Status: Abnormal   Collection Time: 08/07/23  6:43  PM  Result Value Ref Range   Sodium 138 135 - 145 mmol/L   Potassium 3.2 (L) 3.5 - 5.1 mmol/L   Chloride 104 98 - 111 mmol/L   CO2 24 22 - 32 mmol/L   Glucose, Bld 125 (H) 70 - 99 mg/dL    Comment: Glucose reference range applies only to samples taken after fasting for at least 8 hours.   BUN 12 8 - 23 mg/dL   Creatinine, Ser 5.63 0.44 - 1.00 mg/dL   Calcium 9.1 8.9 - 87.5 mg/dL   Total Protein 6.6 6.5 - 8.1 g/dL   Albumin 3.8 3.5 - 5.0 g/dL   AST 20 15 - 41 U/L   ALT 13 0 - 44 U/L   Alkaline Phosphatase 64 38 - 126 U/L   Total Bilirubin 0.5 0.3 - 1.2 mg/dL   GFR, Estimated >64 >33 mL/min    Comment: (NOTE) Calculated using the CKD-EPI Creatinine Equation (2021)    Anion gap 10 5 - 15    Comment: Performed at [facility], [address], [place], [place] [address]  Brain natriuretic peptide     Status: None   Collection Time: 08/07/23  6:43 PM  Result Value Ref Range   B Natriuretic Peptide 85.8 0.0 - 100.0 pg/mL    Comment: Performed at [facility], [address] [place], [place] [address]  I-Stat Lactic Acid     Status: Abnormal   Collection Time: 08/07/23  6:59 PM  Result  Value Ref Range   Lactic Acid, Venous 2.5 (HH) 0.5 - 1.9 mmol/L   Comment NOTIFIED PHYSICIAN   Blood gas, venous     Status: Abnormal   Collection Time: 08/07/23  7:02 PM  Result Value Ref Range   pH, Ven 7.44 (H) 7.25 - 7.43   pCO2, Ven 44 44 - 60 mmHg   pO2, Ven 94 (H) 32 - 45 mmHg   Bicarbonate 29.9 (H) 20.0 - 28.0 mmol/L   Acid-Base Excess 5.0 (H) 0.0 - 2.0 mmol/L   O2 Saturation 99.9 %   Patient temperature 37.0     Comment: Performed at [facility], [address] [place], [place] [address]  Resp panel by RT-PCR (RSV, Flu A&B, Covid) Anterior Nasal Swab     Status: None   Collection Time: 08/07/23  7:06 PM   Specimen: Anterior Nasal Swab  Result Value Ref Range   SARS Coronavirus 2 by RT PCR NEGATIVE NEGATIVE    Comment: (NOTE) SARS-CoV-2 target nucleic acids are NOT DETECTED.  The SARS-CoV-2 RNA is generally detectable in upper respiratory specimens during the acute phase of infection. The lowest concentration of SARS-CoV-2 viral copies this assay can detect is 138 copies/mL. A negative result does not preclude SARS-Cov-2 infection and should not be used as the sole basis for treatment or other patient management decisions. A negative result may occur with  improper specimen collection/handling, submission of specimen other than nasopharyngeal swab, presence of viral mutation(s) within the areas targeted by this assay, and inadequate number of viral copies(<138 copies/mL). A negative result must be combined with clinical observations, patient history, and epidemiological information. The expected result is Negative.  Fact Sheet for Patients:  BloggerCourse.com  Fact Sheet for Healthcare Providers:  SeriousBroker.it  This test is no t yet approved or cleared by the [place] FDA and  has been authorized for detection and/or diagnosis of SARS-CoV-2 by FDA under an Emergency Use Authorization  (EUA). This EUA will  remain  in effect (meaning this test can be used) for the duration of the COVID-19 declaration under Section 564(b)(1) of the Act, 21 U.S.C.section 360bbb-3(b)(1), unless the authorization is terminated  or revoked sooner.       Influenza A by PCR NEGATIVE NEGATIVE   Influenza B by PCR NEGATIVE NEGATIVE    Comment: (NOTE) The Xpert Xpress SARS-CoV-2/FLU/RSV plus assay is intended as an aid in the diagnosis of influenza from Nasopharyngeal swab specimens and should not be used as a sole basis for treatment. Nasal washings and aspirates are unacceptable for Xpert Xpress SARS-CoV-2/FLU/RSV testing.  Fact Sheet for Patients: BloggerCourse.com  Fact Sheet for Healthcare Providers: SeriousBroker.it  This test is not yet approved or cleared by the [place] FDA and has been authorized for detection and/or diagnosis of SARS-CoV-2 by FDA under an Emergency Use Authorization (EUA). This EUA will remain in effect (meaning this test can be used) for the duration of the COVID-19 declaration under Section 564(b)(1) of the Act, 21 U.S.C. section 360bbb-3(b)(1), unless the authorization is terminated or revoked.     Resp Syncytial Virus by PCR NEGATIVE NEGATIVE    Comment: (NOTE) Fact Sheet for Patients: BloggerCourse.com  Fact Sheet for Healthcare Providers: SeriousBroker.it  This test is not yet approved or cleared by the [place] FDA and has been authorized for detection and/or diagnosis of SARS-CoV-2 by FDA under an Emergency Use Authorization (EUA). This EUA will remain in effect (meaning this test can be used) for the duration of the COVID-19 declaration under Section 564(b)(1) of the Act, 21 U.S.C. section 360bbb-3(b)(1), unless the authorization is terminated or revoked.  Performed at [facility], [address] [place],  [place] [address]    DG Chest Port 1 View  Result Date: 08/07/2023 CLINICAL DATA:  Shortness of breath. Decreased oxygen saturation. Wheezing. EXAM: PORTABLE CHEST 1 VIEW COMPARISON:  10/15/2022 FINDINGS: Examination is somewhat limited due to patient rotation. As visualized, the heart size and pulmonary vascularity appear normal. Peribronchial thickening with some interstitial changes centrally are consistent with airways disease. No focal consolidation or infiltrate. No pleural effusions. No pneumothorax. Degenerative changes in the spine. IMPRESSION: Peribronchial thickening and central interstitial changes or consistent with airways disease. No focal consolidation. Electronically Signed   By: Burman Nieves M.D.   On: 08/07/2023 20:28    Pending Labs Unresulted Labs (From admission, onward)     Start     Ordered   08/07/23 1851  Urinalysis, w/ Reflex to Culture (Infection Suspected) -Urine, Clean Catch  Once,   URGENT       Question:  Specimen Source  Answer:  Urine, Clean Catch   08/07/23 1851   08/07/23 1851  Culture, blood (routine x 2)  BLOOD CULTURE X 2,   R (with STAT occurrences)      08/07/23 1851   Signed and Held  HIV Antibody (routine testing w rflx)  (HIV Antibody (Routine testing w reflex) panel)  Once,   R        Signed and Held   Signed and Held  HIV Antibody (routine testing w rflx)  (HIV Antibody (Routine testing w reflex) panel)  Once,   R        Signed and Held   Signed and Held  CBC  (enoxaparin (LOVENOX)    CrCl >/= 30 ml/min)  Once,   R       Comments: Baseline for enoxaparin therapy IF NOT ALREADY DRAWN.  Notify MD if PLT <  100 K.    Signed and Held   Signed and Held  Creatinine, serum  (enoxaparin (LOVENOX)    CrCl >/= 30 ml/min)  Once,   R       Comments: Baseline for enoxaparin therapy IF NOT ALREADY DRAWN.    Signed and Held   Signed and Held  Creatinine, serum  (enoxaparin (LOVENOX)    CrCl >/= 30 ml/min)  Weekly,   R     Comments: while on enoxaparin therapy     Signed and Held   Signed and Held  Comprehensive metabolic panel  Tomorrow morning,   R        Signed and Held   Signed and Held  CBC  Tomorrow morning,   R        Signed and Held            Vitals/Pain Today's Vitals   08/07/23 1842 08/07/23 1847 08/07/23 1900 08/07/23 1945  BP: 95/64  103/67 110/66  Pulse: (!) 101  99 90  Resp: (!) 24  (!) 24 (!) 21  Temp: 98.1 F (36.7 C)     TempSrc: Axillary     SpO2: 100%  96% 94%  PainSc:  4       Isolation Precautions No active isolations  Medications Medications  sodium chloride 0.9 % bolus 500 mL (500 mLs Intravenous New Bag/Given 08/07/23 1906)    Mobility walks with device     Focused Assessments    R Recommendations: See Admitting Provider Note  Report given to:   Additional Notes:

## 2023-08-07 NOTE — H&P (Signed)
History and Physical    Patient: Danielle [name] GEX:[id number] DOB: 01-Apr-1951 DOA: 08/07/2023 DOS: the patient was seen and examined on 08/07/2023 PCP: [facility]  Patient coming from: Home  Chief Complaint:  Chief Complaint  Patient presents with   Shortness of Breath   HPI: Danielle [name] is a 72 y.o. female with medical history significant of bipolar disorder, coronary artery disease, GERD, essential hypertension, history of CVA, tobacco abuse, who presents was shortness of breath and cough.  Patient has been having progressive shortness of breath for the last few days.  He is a resident of his facility.  Patient was having increasing wheezing and shortness of breath and EMS arrived oxygen was in the 70s.  Was given magnesium as well as Solu-Medrol 125 mg by EMS and brought to the ER.  In the ER she continues to have significant shortness of breath and wheezing.  Also cough.  Patient has been admitted with acute on chronic hypoxic respiratory failure probably due to COPD exacerbation.  Review of Systems: As mentioned in the history of present illness. All other systems reviewed and are negative. Past Medical History:  Diagnosis Date   Asthma    Bipolar affective disorder, currently depressed, mild (HCC)    Chest pain    Complication of anesthesia    " My eyes swell up "   Coronary artery disease    Depression    Fall 08/25/2012   Recent fall with residual knee and back pain.   Family history of anesthesia complication    " [name] "   GERD (gastroesophageal reflux disease)    Hypertension    Memory disturbance 01/30/2016   Stroke (HCC)    Syncope and collapse 03/21/2019   Tardive dyskinesia    Tobacco abuse    Past Surgical History:  Procedure Laterality Date   ABDOMINAL HYSTERECTOMY     BIOPSY  05/11/2022   Procedure: BIOPSY;  Surgeon: Lynann Bologna, MD;  Location: [facility] ENDOSCOPY;  Service: Gastroenterology;;   CARDIAC CATHETERIZATION      ESOPHAGOGASTRODUODENOSCOPY (EGD) WITH PROPOFOL N/A 05/11/2022   Procedure: ESOPHAGOGASTRODUODENOSCOPY (EGD) WITH PROPOFOL;  Surgeon: Lynann Bologna, MD;  Location: [facility] ENDOSCOPY;  Service: Gastroenterology;  Laterality: N/A;   HEMOSTASIS CONTROL  05/11/2022   Procedure: HEMOSTASIS CONTROL;  Surgeon: Lynann Bologna, MD;  Location: [facility] ENDOSCOPY;  Service: Gastroenterology;;   HOT HEMOSTASIS N/A 05/11/2022   Procedure: HOT HEMOSTASIS (ARGON PLASMA COAGULATION/BICAP);  Surgeon: Lynann Bologna, MD;  Location: [facility] ENDOSCOPY;  Service: Gastroenterology;  Laterality: N/A;   RIGHT/LEFT HEART CATH AND CORONARY ANGIOGRAPHY N/A 08/14/2018   Procedure: RIGHT/LEFT HEART CATH AND CORONARY ANGIOGRAPHY;  Surgeon: Marykay Lex, MD;  Location: [facility] INVASIVE CV LAB;  Service: Cardiovascular;  Laterality: N/A;   Social History:  reports that she has been smoking cigarettes. She has never used smokeless tobacco. She reports that she does not currently use alcohol. She reports that she does not currently use drugs after having used the following drugs: Cocaine and Marijuana.  Allergies  Allergen Reactions   Clonazepam Swelling and Other (See Comments)    Pt was recently given a triamterene hydrochlorothiazide combination as well as clonazepam and had an allergic reaction to one of them. Caused swelling of face and eyes   Doxycycline Swelling and Other (See Comments)    Swelling of face and eyes   Fosinopril Sodium Swelling and Other (See Comments)    Swelling face,lips-angioedema from ACE I   Hydrochlorothiazide W-Triamterene Swelling    Pt was  recently given a triamterene hydrochlorothiazide combination as well as clonazepam and had an allergic reaction to one of them. Swelling of face and eyes   Penicillins Swelling and Other (See Comments)    Face and eyes Has patient had a PCN reaction causing immediate rash, facial/tongue/throat swelling, SOB or lightheadedness with hypotension: Yes Has patient had a PCN reaction  causing severe rash involving mucus membranes or skin necrosis: No Has patient had a PCN reaction that required hospitalization: No Has patient had a PCN reaction occurring within the last 10 years: No If all of the above answers are "NO", then may proceed with Cephalosporin use.    Sulfa Antibiotics Other (See Comments)    "Allergic," per MAR   Triamterene Other (See Comments)    Pt was recently given a triamterene hydrochlorothiazide combination as well as clonazepam and had an allergic reaction to one of them. "Allergic," per [facility]    Family History  Problem Relation Age of Onset   Hypertension Mother    Stroke Mother    Heart attack Mother    Heart attack Father    Heart attack Sister    Heart attack Brother    Dementia Neg Hx    Colon cancer Neg Hx    Esophageal cancer Neg Hx    Stomach cancer Neg Hx     Prior to Admission medications   Medication Sig Start Date End Date Taking? Authorizing Provider  albuterol (PROVENTIL) (2.5 MG/3ML) 0.083% nebulizer solution Take 2.5 mg by nebulization every 6 (six) hours as needed for shortness of breath.   Yes [provider]  albuterol (VENTOLIN HFA) 108 (90 Base) MCG/ACT inhaler Inhale 1-2 puffs into the lungs every 4 (four) hours as needed for wheezing or shortness of breath (or cough). 10/19/22  Yes Glade Lloyd, MD  amLODipine (NORVASC) 5 MG tablet Take 5 mg by mouth daily.   Yes [provider]  atorvastatin (LIPITOR) 10 MG tablet Take 10 mg by mouth daily.   Yes [provider]  budesonide-formoterol (SYMBICORT) 160-4.5 MCG/ACT inhaler Inhale 2 puffs into the lungs 2 (two) times daily. 10/19/22  Yes Glade Lloyd, MD  Cholecalciferol (VITAMIN D3) 250 MCG (10000 UT) TABS Take 10,000 Units by mouth daily.   Yes [provider]  DHEA 25 MG CAPS Take 25 mg by mouth in the morning.   Yes [provider]  HYDROcodone-acetaminophen (NORCO) 10-325 MG tablet Take 1 tablet by mouth See admin  instructions. Give 1 tablet by mouth 5 time a day as needed   Yes [provider]  hydrOXYzine (VISTARIL) 25 MG capsule Take 25 mg by mouth 2 (two) times daily as needed for anxiety.   Yes [provider]  latanoprost (XALATAN) 0.005 % ophthalmic solution Place 1 drop into both eyes at bedtime.   Yes [provider]  loratadine (CLARITIN) 10 MG tablet Take 10 mg by mouth daily.   Yes [provider]  pregabalin (LYRICA) 75 MG capsule Take 75 mg by mouth 3 (three) times daily. 04/07/22  Yes [provider]  QUEtiapine (SEROQUEL) 200 MG tablet Take 200 mg by mouth at bedtime. 05/06/22  Yes [provider]  sertraline (ZOLOFT) 100 MG tablet Take 100 mg by mouth daily.   Yes [provider]  tiZANidine (ZANAFLEX) 2 MG tablet Take 2 mg by mouth every 8 (eight) hours as needed for muscle spasms.   Yes [provider]  triamcinolone cream (KENALOG) 0.1 % Apply 1 g topically See admin  instructions. Apply (1 grams) green pea-sized bead to popliteal fossae twice a day 04/27/22  Yes [provider]  valbenazine (INGREZZA) 80 MG capsule Take 1 capsule (80 mg total) by mouth daily. 05/26/23  Yes   nicotine (NICODERM CQ - DOSED IN MG/24 HOURS) 21 mg/24hr patch Place 1 patch (21 mg total) onto the skin daily. Patient not taking: Reported on 08/07/2023 05/15/22   Dorcas Carrow, MD    Physical Exam: Vitals:   08/07/23 1842 08/07/23 1900 08/07/23 1945  BP: 95/64 103/67 110/66  Pulse: (!) 101 99 90  Resp: (!) 24 (!) 24 (!) 21  Temp: 98.1 F (36.7 C)    TempSrc: Axillary    SpO2: 100% 96% 94%   Constitutional: Chronically ill looking in mild respiratory distress, NAD, calm, comfortable Eyes: PERRL, lids and conjunctivae normal ENMT: Mucous membranes are moist. Posterior pharynx clear of any exudate or lesions.Normal dentition.  Neck: normal, supple, no masses, no thyromegaly Respiratory: Decreased air entry bilaterally with marked  expiratory wheezing, no crackles. Normal respiratory effort. No accessory muscle use.  Cardiovascular: Sinus tachycardia, no murmurs / rubs / gallops. No extremity edema. 2+ pedal pulses. No carotid bruits.  Abdomen: no tenderness, no masses palpated. No hepatosplenomegaly. Bowel sounds positive.  Musculoskeletal: Good range of motion, no joint swelling or tenderness, Skin: no rashes, lesions, ulcers. No induration Neurologic: CN 2-12 grossly intact. Sensation intact, DTR normal. Strength 5/5 in all 4.  Psychiatric: Normal judgment and insight. Alert and oriented x 3. Normal mood  Data Reviewed:  Heart rate is 101 respirate of 24 oxygen sat 94% on 2 L potassium 3.2, lactic acid 3.7 glucose 125 acute respiratory panel negative for any viral infection.  Chest x-ray showed no focal consolidations  Assessment and Plan:  #1 acute on chronic hypoxic respiratory failure: Secondary to COPD exacerbation.  Patient will be admitted.  Initiate IV steroid, antibiotic, and oxygen.  Continue supportive care.  #2 COPD exacerbation: Continue treatment as above.  Continue to monitor.  #3 tobacco abuse: Counseling.  Continue to monitor.  #4 hyperlipidemia: Continue statin.  #5 history of polysubstance abuse: Patient said no recent use.  Continue to monitor.  #6 schizophrenia: Will continue home regimen.  #7 essential hypertension: Blood pressure is controlled.  Continue home regimen  #8 coronary artery disease: Stable.  No new issues.    Advance Care Planning:   Code Status: Full Code   Consults: None  Family Communication: No family at bedside  Severity of Illness: The appropriate patient status for this patient is INPATIENT. Inpatient status is judged to be reasonable and necessary in order to provide the required intensity of service to ensure the patient's safety. The patient's presenting symptoms, physical exam findings, and initial radiographic and laboratory data in the context of their  chronic comorbidities is felt to place them at high risk for further clinical deterioration. Furthermore, it is not anticipated that the patient will be medically stable for discharge from the hospital within 2 midnights of admission.   * I certify that at the point of admission it is my clinical judgment that the patient will require inpatient hospital care spanning beyond 2 midnights from the point of admission due to high intensity of service, high risk for further deterioration and high frequency of surveillance required.*  AuthorLonia Blood, MD 08/07/2023 8:39 PM  For on call review [email or web address].

## 2023-08-07 NOTE — ED Triage Notes (Signed)
Pt BIBA from [facility]. C/o SOB was 78 on RA on EMS arrival w/wheezing in all lobes. Given 50 mg mag, 10 mg albuterol, 125 solu-medrol, and atrovent. Up to 100 Spo2  AOx4

## 2023-08-08 ENCOUNTER — Inpatient Hospital Stay (HOSPITAL_COMMUNITY): Payer: 59

## 2023-08-08 ENCOUNTER — Encounter (HOSPITAL_COMMUNITY): Payer: Self-pay | Admitting: Internal Medicine

## 2023-08-08 DIAGNOSIS — J9601 Acute respiratory failure with hypoxia: Secondary | ICD-10-CM | POA: Diagnosis not present

## 2023-08-08 DIAGNOSIS — F1994 Other psychoactive substance use, unspecified with psychoactive substance-induced mood disorder: Secondary | ICD-10-CM | POA: Diagnosis not present

## 2023-08-08 DIAGNOSIS — E876 Hypokalemia: Secondary | ICD-10-CM | POA: Diagnosis not present

## 2023-08-08 DIAGNOSIS — Z720 Tobacco use: Secondary | ICD-10-CM

## 2023-08-08 DIAGNOSIS — K219 Gastro-esophageal reflux disease without esophagitis: Secondary | ICD-10-CM

## 2023-08-08 DIAGNOSIS — B348 Other viral infections of unspecified site: Secondary | ICD-10-CM

## 2023-08-08 DIAGNOSIS — I10 Essential (primary) hypertension: Secondary | ICD-10-CM

## 2023-08-08 DIAGNOSIS — F411 Generalized anxiety disorder: Secondary | ICD-10-CM

## 2023-08-08 LAB — RESPIRATORY PANEL BY PCR

## 2023-08-08 LAB — COMPREHENSIVE METABOLIC PANEL
ALT: 14 U/L (ref 0–44)
AST: 18 U/L (ref 15–41)
Albumin: 3.6 g/dL (ref 3.5–5.0)
Alkaline Phosphatase: 62 U/L (ref 38–126)
Anion gap: 11 (ref 5–15)
BUN: 15 mg/dL (ref 8–23)
CO2: 21 mmol/L — ABNORMAL LOW (ref 22–32)
Calcium: 9.4 mg/dL (ref 8.9–10.3)
Chloride: 105 mmol/L (ref 98–111)
Creatinine, Ser: 0.77 mg/dL (ref 0.44–1.00)
GFR, Estimated: >60 mL/min (ref >60)
Glucose, Bld: 151 mg/dL — ABNORMAL HIGH (ref 70–99)
Potassium: 3.7 mmol/L (ref 3.5–5.1)
Sodium: 137 mmol/L (ref 135–145)
Total Bilirubin: 0.3 mg/dL (ref 0.3–1.2)
Total Protein: 6.6 g/dL (ref 6.5–8.1)

## 2023-08-08 LAB — BLOOD GAS, ARTERIAL
Acid-base deficit: 0.1 mmol/L (ref 0.0–2.0)
Bicarbonate: 23.9 mmol/L (ref 20.0–28.0)
Drawn by: 25770
O2 Content: 2 L/min
O2 Saturation: 94.4 %
Patient temperature: 37.1
pCO2 arterial: 36 mm[Hg] (ref 32–48)
pH, Arterial: 7.43 (ref 7.35–7.45)
pO2, Arterial: 62 mm[Hg] — ABNORMAL LOW (ref 83–108)

## 2023-08-08 LAB — LACTIC ACID, PLASMA
Lactic Acid, Venous: 2.6 mmol/L (ref 0.5–1.9)
Lactic Acid, Venous: 2.4 mmol/L (ref 0.5–1.9)

## 2023-08-08 LAB — CBC
HCT: 39.3 % (ref 36.0–46.0)
Hemoglobin: 12.9 g/dL (ref 12.0–15.0)
MCH: 30.7 pg (ref 26.0–34.0)
MCHC: 32.8 g/dL (ref 30.0–36.0)
MCV: 93.6 fL (ref 80.0–100.0)
Platelets: 211 10*3/uL (ref 150–400)
RBC: 4.2 MIL/uL (ref 3.87–5.11)
RDW: 14.8 % (ref 11.5–15.5)
WBC: 4.4 10*3/uL (ref 4.0–10.5)
nRBC: 0 % (ref 0.0–0.2)

## 2023-08-08 LAB — HIV ANTIBODY (ROUTINE TESTING W REFLEX): HIV Screen 4th Generation wRfx: NONREACTIVE

## 2023-08-08 LAB — MRSA NEXT GEN BY PCR, NASAL: MRSA by PCR Next Gen: NOT DETECTED

## 2023-08-08 MED ORDER — VALBENAZINE TOSYLATE 40 MG PO CAPS
80.0000 mg | ORAL_CAPSULE | Freq: Every day | ORAL | Status: DC
  Administered 2023-08-08 – 2023-08-15 (×8): 80 mg via ORAL
  Filled 2023-08-08 (×8): qty 2

## 2023-08-08 MED ORDER — ALBUTEROL SULFATE (2.5 MG/3ML) 0.083% IN NEBU
2.5000 mg | INHALATION_SOLUTION | RESPIRATORY_TRACT | Status: DC | PRN
  Administered 2023-08-09 – 2023-08-12 (×2): 2.5 mg via RESPIRATORY_TRACT
  Filled 2023-08-08 (×3): qty 3

## 2023-08-08 MED ORDER — METHYLPREDNISOLONE SODIUM SUCC 125 MG IJ SOLR: 60.0000 mg | INTRAMUSCULAR | Status: DC

## 2023-08-08 MED ORDER — SERTRALINE HCL 100 MG PO TABS
100.0000 mg | ORAL_TABLET | Freq: Every day | ORAL | Status: DC
  Administered 2023-08-08 – 2023-08-15 (×8): 100 mg via ORAL
  Filled 2023-08-08 (×8): qty 1

## 2023-08-08 MED ORDER — SODIUM CHLORIDE 0.9 % IV BOLUS
500.0000 mL | Freq: Once | INTRAVENOUS | Status: AC
  Administered 2023-08-08: 500 mL via INTRAVENOUS

## 2023-08-08 MED ORDER — SODIUM CHLORIDE 0.9 % IV SOLN
1.0000 g | INTRAVENOUS | Status: DC
  Administered 2023-08-08 – 2023-08-09 (×2): 1 g via INTRAVENOUS
  Filled 2023-08-08 (×2): qty 10

## 2023-08-08 MED ORDER — GUAIFENESIN ER 600 MG PO TB12
1200.0000 mg | ORAL_TABLET | Freq: Two times a day (BID) | ORAL | Status: DC
  Administered 2023-08-08 – 2023-08-15 (×15): 1200 mg via ORAL
  Filled 2023-08-08 (×15): qty 2

## 2023-08-08 MED ORDER — CHLORHEXIDINE GLUCONATE CLOTH 2 % EX PADS
6.0000 | MEDICATED_PAD | Freq: Every day | CUTANEOUS | Status: DC
  Administered 2023-08-08 – 2023-08-11 (×4): 6 via TOPICAL

## 2023-08-08 MED ORDER — ENOXAPARIN SODIUM 40 MG/0.4ML IJ SOSY
40.0000 mg | PREFILLED_SYRINGE | INTRAMUSCULAR | Status: DC
Start: 2023-08-08 — End: 2023-08-15
  Administered 2023-08-08 – 2023-08-14 (×7): 40 mg via SUBCUTANEOUS
  Filled 2023-08-08 (×7): qty 0.4

## 2023-08-08 MED ORDER — MORPHINE SULFATE (PF) 2 MG/ML IV SOLN
1.0000 mg | INTRAVENOUS | Status: DC | PRN
  Administered 2023-08-08 – 2023-08-14 (×22): 1 mg via INTRAVENOUS
  Filled 2023-08-08 (×22): qty 1

## 2023-08-08 MED ORDER — METHYLPREDNISOLONE SODIUM SUCC 125 MG IJ SOLR
60.0000 mg | Freq: Two times a day (BID) | INTRAMUSCULAR | Status: DC
  Administered 2023-08-09 (×2): 60 mg via INTRAVENOUS
  Filled 2023-08-08 (×2): qty 2

## 2023-08-08 MED ORDER — BUDESONIDE 0.25 MG/2ML IN SUSP
0.2500 mg | Freq: Two times a day (BID) | RESPIRATORY_TRACT | Status: DC
  Administered 2023-08-08 – 2023-08-15 (×14): 0.25 mg via RESPIRATORY_TRACT
  Filled 2023-08-08 (×14): qty 2

## 2023-08-08 MED ORDER — HYDROXYZINE HCL 25 MG PO TABS
25.0000 mg | ORAL_TABLET | Freq: Two times a day (BID) | ORAL | Status: DC | PRN
  Administered 2023-08-08 – 2023-08-14 (×5): 25 mg via ORAL
  Filled 2023-08-08 (×7): qty 1

## 2023-08-08 MED ORDER — KCL IN DEXTROSE-NACL 20-5-0.45 MEQ/L-%-% IV SOLN
INTRAVENOUS | Status: DC
  Filled 2023-08-08: qty 1000

## 2023-08-08 MED ORDER — METHYLPREDNISOLONE SODIUM SUCC 125 MG IJ SOLR
125.0000 mg | Freq: Two times a day (BID) | INTRAMUSCULAR | Status: AC
  Administered 2023-08-08 (×2): 125 mg via INTRAVENOUS
  Filled 2023-08-08 (×2): qty 2

## 2023-08-08 MED ORDER — SODIUM CHLORIDE 0.9 % IV SOLN
500.0000 mg | INTRAVENOUS | Status: DC
  Administered 2023-08-08 – 2023-08-11 (×4): 500 mg via INTRAVENOUS
  Filled 2023-08-08 (×4): qty 5

## 2023-08-08 MED ORDER — LORATADINE 10 MG PO TABS
10.0000 mg | ORAL_TABLET | Freq: Every day | ORAL | Status: DC
  Administered 2023-08-08 – 2023-08-15 (×8): 10 mg via ORAL
  Filled 2023-08-08 (×8): qty 1

## 2023-08-08 MED ORDER — PREDNISONE 20 MG PO TABS: 40.0000 mg | ORAL_TABLET | Freq: Every day | ORAL | Status: DC

## 2023-08-08 MED ORDER — MOMETASONE FURO-FORMOTEROL FUM 200-5 MCG/ACT IN AERO
2.0000 | INHALATION_SPRAY | Freq: Two times a day (BID) | RESPIRATORY_TRACT | Status: DC
  Administered 2023-08-08: 2 via RESPIRATORY_TRACT
  Filled 2023-08-08: qty 8.8

## 2023-08-08 MED ORDER — PREGABALIN 75 MG PO CAPS
75.0000 mg | ORAL_CAPSULE | Freq: Three times a day (TID) | ORAL | Status: DC
  Administered 2023-08-08 – 2023-08-15 (×21): 75 mg via ORAL
  Filled 2023-08-08 (×21): qty 1

## 2023-08-08 MED ORDER — HYDROCODONE-ACETAMINOPHEN 10-325 MG PO TABS
1.0000 | ORAL_TABLET | Freq: Every day | ORAL | Status: DC | PRN
  Administered 2023-08-08 – 2023-08-15 (×10): 1 via ORAL
  Filled 2023-08-08 (×10): qty 1

## 2023-08-08 MED ORDER — ARFORMOTEROL TARTRATE 15 MCG/2ML IN NEBU
15.0000 ug | INHALATION_SOLUTION | Freq: Two times a day (BID) | RESPIRATORY_TRACT | Status: DC
  Administered 2023-08-08 – 2023-08-15 (×15): 15 ug via RESPIRATORY_TRACT
  Filled 2023-08-08 (×15): qty 2

## 2023-08-08 MED ORDER — LATANOPROST 0.005 % OP SOLN
1.0000 [drp] | Freq: Every day | OPHTHALMIC | Status: DC
  Administered 2023-08-08 (×2): 1 [drp] via OPHTHALMIC
  Filled 2023-08-08: qty 2.5

## 2023-08-08 MED ORDER — AMLODIPINE BESYLATE 5 MG PO TABS
5.0000 mg | ORAL_TABLET | Freq: Every day | ORAL | Status: DC
  Administered 2023-08-08 – 2023-08-15 (×8): 5 mg via ORAL
  Filled 2023-08-08 (×8): qty 1

## 2023-08-08 MED ORDER — TIZANIDINE HCL 2 MG PO TABS
2.0000 mg | ORAL_TABLET | Freq: Three times a day (TID) | ORAL | Status: DC | PRN
  Administered 2023-08-14: 2 mg via ORAL
  Filled 2023-08-08: qty 1

## 2023-08-08 MED ORDER — IPRATROPIUM BROMIDE 0.02 % IN SOLN
0.5000 mg | Freq: Four times a day (QID) | RESPIRATORY_TRACT | Status: DC
  Administered 2023-08-08 – 2023-08-15 (×27): 0.5 mg via RESPIRATORY_TRACT
  Filled 2023-08-08 (×27): qty 2.5

## 2023-08-08 MED ORDER — NICOTINE 21 MG/24HR TD PT24
21.0000 mg | MEDICATED_PATCH | Freq: Every day | TRANSDERMAL | Status: DC
Start: 2023-08-08 — End: 2023-08-15
  Administered 2023-08-08 – 2023-08-15 (×8): 21 mg via TRANSDERMAL
  Filled 2023-08-08 (×8): qty 1

## 2023-08-08 MED ORDER — LEVALBUTEROL HCL 0.63 MG/3ML IN NEBU
0.6300 mg | INHALATION_SOLUTION | Freq: Four times a day (QID) | RESPIRATORY_TRACT | Status: DC
  Administered 2023-08-08 – 2023-08-15 (×27): 0.63 mg via RESPIRATORY_TRACT
  Filled 2023-08-08 (×27): qty 3

## 2023-08-08 MED ORDER — IPRATROPIUM-ALBUTEROL 0.5-2.5 (3) MG/3ML IN SOLN
3.0000 mL | Freq: Four times a day (QID) | RESPIRATORY_TRACT | Status: DC
  Administered 2023-08-08 (×2): 3 mL via RESPIRATORY_TRACT
  Filled 2023-08-08 (×2): qty 3

## 2023-08-08 MED ORDER — ALBUTEROL SULFATE (2.5 MG/3ML) 0.083% IN NEBU
3.0000 mL | INHALATION_SOLUTION | RESPIRATORY_TRACT | Status: DC | PRN
  Administered 2023-08-09 (×2): 3 mL via RESPIRATORY_TRACT
  Filled 2023-08-08: qty 3

## 2023-08-08 MED ORDER — QUETIAPINE FUMARATE 100 MG PO TABS
200.0000 mg | ORAL_TABLET | Freq: Every day | ORAL | Status: DC
  Administered 2023-08-08 – 2023-08-14 (×8): 200 mg via ORAL
  Filled 2023-08-08 (×8): qty 2

## 2023-08-08 MED ORDER — ATORVASTATIN CALCIUM 10 MG PO TABS
10.0000 mg | ORAL_TABLET | Freq: Every day | ORAL | Status: DC
  Administered 2023-08-08 – 2023-08-15 (×8): 10 mg via ORAL
  Filled 2023-08-08 (×8): qty 1

## 2023-08-08 NOTE — Progress Notes (Signed)
   08/08/23 1433  BiPAP/CPAP/SIPAP  $ Non-Invasive Ventilator  Non-Invasive Vent Initial  $ Face Mask Medium Yes  BiPAP/CPAP/SIPAP Pt Type Adult  BiPAP/CPAP/SIPAP V60  Mask Type Full face mask  Mask Size Medium  Set Rate 10 breaths/min  Respiratory Rate 25 breaths/min  IPAP 10 cmH20  EPAP 5 cmH2O  FiO2 (%) 28 %  Minute Ventilation 13.3  Leak 4  Peak Inspiratory Pressure (PIP) 10  Tidal Volume (Vt) 530  Auto Titrate No  Press High Alarm 30 cmH2O  Press Low Alarm 5 cmH2O  BiPAP/CPAP /SiPAP Vitals  Pulse Rate (!) 106  Resp (!) 25  SpO2 96 %  Bilateral Breath Sounds Expiratory wheezes  MEWS Score/Color  MEWS Score 2  MEWS Score Color Yellow

## 2023-08-08 NOTE — Progress Notes (Signed)
PT placed on BiPAP (setting and vitals on Flowsheet)- PT appears to be breathing easier and verbally confirms that she is. RN aware. PT room door remains open- RN aware.

## 2023-08-08 NOTE — Evaluation (Signed)
Physical Therapy Evaluation Patient Details Name: Danielle [name] MRN: [id number] DOB: [birth year] Today's Date: 08/08/2023  History of Present Illness  Jeweline Breitling is a 72 y.o. female presents with SOB and cough. Pt admitted with acute on chronic hypoxic respiratory failure. PMH: COPD, schizophrenia, HTN, CAD, bipolar, stroke, tardive dyskinesia  Clinical Impression  Pt admitted with above diagnosis. Pt from ALF, reports using rollator for facility ambulation, facility provides meals and housekeeping, reports 1 fall when trying to get OOB to bathroom. Pt currently mobilizing with CGA, audible wheezing, pt reports anxiousness regarding dyspnea, SpO2 >90% with mobility and HR 110s. Pt with good safety awareness using RW, needing CGA for transfers with BLE braced against the front of the bed. Recommend HHPT at d/c. Pt seated on BSC at EOS with RN stepping out to get pt's medication. Pt currently with functional limitations due to the deficits listed below (see PT Problem List). Pt will benefit from acute skilled PT to increase their independence and safety with mobility to allow discharge.           If plan is discharge home, recommend the following: Assistance with cooking/housework   Can travel by private vehicle        Equipment Recommendations None recommended by PT  Recommendations for Other Services       Functional Status Assessment Patient has had a recent decline in their functional status and demonstrates the ability to make significant improvements in function in a reasonable and predictable amount of time.     Precautions / Restrictions Precautions Precautions: Fall Precaution Comments: monitor O2 and HR Restrictions Weight Bearing Restrictions: No      Mobility  Bed Mobility Overal bed mobility: Needs Assistance Bed Mobility: Supine to Sit, Sit to Supine     Supine to sit: Contact guard, HOB elevated, Used rails Sit to supine: Contact guard assist, HOB  elevated, Used rails   General bed mobility comments: CGA with heavy use of bedrails and elevated HOB to come to sitting EOB, therapist managing O2 tubing    Transfers Overall transfer level: Needs assistance Equipment used: Rolling walker (2 wheels) Transfers: Sit to/from Stand, Bed to chair/wheelchair/BSC Sit to Stand: Contact guard assist   Step pivot transfers: Contact guard assist       General transfer comment: verbal cues for hand placement, CGA for power to stand and step-pivot to BSC, BLE braced against bed while powering up    Ambulation/Gait Ambulation/Gait assistance: Contact guard assist   Assistive device: Rolling walker (2 wheels)         General Gait Details: pt perform static marching and able to take steps forward, backward and sidesteps at bedside with RW, significant SOB and audible wheezing with pt reporting anxiousness so further ambulation not attempted  Stairs            Wheelchair Mobility     Tilt Bed    Modified Rankin (Stroke Patients Only)       Balance Overall balance assessment: Mild deficits observed, not formally tested                                           Pertinent Vitals/Pain Pain Assessment Pain Assessment: 0-10 Pain Score: 8  Pain Location: back Pain Descriptors / Indicators: Discomfort Pain Intervention(s): Limited activity within patient's tolerance, Monitored during session, Patient requesting pain meds-RN notified, Repositioned    Home Living  Family/patient expects to be discharged to:: Assisted living ([facility])                 Home Equipment: Rollator (4 wheels)      Prior Function Prior Level of Function : Independent/Modified Independent             Mobility Comments: pt reports ind with rollator in facility, pt reports 1 fall getting OOB to bathroom ADLs Comments: pt reports ind with showering, toileting and dressing; facility provides household chores and meals      Extremity/Trunk Assessment   Upper Extremity Assessment Upper Extremity Assessment: Right hand dominant;Generalized weakness (limited tolerance of shoulder movement with SOB and accessory muscles working)    Lower Extremity Assessment Lower Extremity Assessment: Defer to PT evaluation    Cervical / Trunk Assessment Cervical / Trunk Assessment: Normal  Communication   Communication Communication: No apparent difficulties  Cognition Arousal: Alert Behavior During Therapy: WFL for tasks assessed/performed, Anxious Overall Cognitive Status: Within Functional Limits for tasks assessed                                          General Comments      Exercises     Assessment/Plan    PT Assessment Patient needs continued PT services  PT Problem List Decreased strength;Decreased activity tolerance;Decreased balance;Decreased mobility;Decreased knowledge of use of DME;Decreased knowledge of precautions;Cardiopulmonary status limiting activity;Pain       PT Treatment Interventions DME instruction;Gait training;Functional mobility training;Therapeutic activities;Therapeutic exercise;Balance training;Neuromuscular re-education;Patient/family education    PT Goals (Current goals can be found in the Care Plan section)  Acute Rehab PT Goals Patient Stated Goal: agreeable to HHPT, improved breathing PT Goal Formulation: With patient Time For Goal Achievement: 08/22/23 Potential to Achieve Goals: Good    Frequency Min 1X/week     Co-evaluation PT/OT/SLP Co-Evaluation/Treatment: Yes Reason for Co-Treatment: For patient/therapist safety;To address functional/ADL transfers PT goals addressed during session: Mobility/safety with mobility;Balance;Proper use of DME         AM-PAC PT "6 Clicks" Mobility  Outcome Measure Help needed turning from your back to your side while in a flat bed without using bedrails?: A Little Help needed moving from lying on your back  to sitting on the side of a flat bed without using bedrails?: A Little Help needed moving to and from a bed to a chair (including a wheelchair)?: A Little Help needed standing up from a chair using your arms (e.g., wheelchair or bedside chair)?: A Little Help needed to walk in hospital room?: A Lot Help needed climbing 3-5 steps with a railing? : A Lot 6 Click Score: 16    End of Session Equipment Utilized During Treatment: Oxygen Activity Tolerance: Patient tolerated treatment well Patient left: with call bell/phone within reach (on [facility], RN aware) Nurse Communication: Mobility status;Patient requests pain meds PT Visit Diagnosis: Other abnormalities of gait and mobility (R26.89);Muscle weakness (generalized) (M62.81)    Time: 7253-6644 PT Time Calculation (min) (ACUTE ONLY): 35 min   Charges:   PT Evaluation $PT Eval Low Complexity: 1 Low PT Treatments $Therapeutic Activity: 8-22 mins PT General Charges $$ ACUTE PT VISIT: 1 Visit         Tori Nahum Sherrer PT, DPT 08/08/23, 1:32 PM

## 2023-08-08 NOTE — Progress Notes (Signed)
Initial Nutrition Assessment  DOCUMENTATION CODES:  Not applicable  INTERVENTION:  If PO diet remains in place, recommend liberalizing to regular diet to promote PO intake If PO diet remains, add Ensure Enlive po BID, each supplement provides 350 kcal and 20 grams of protein. If enteral nutrition is needed, recommend the following: Osmolite 1.2 @ 61mL/H (1462mL/d) This provides 1728 kcal, 80g protein, and free water  NUTRITION DIAGNOSIS:   Increased nutrient needs related to acute illness (respiratory failure) as evidenced by estimated needs.  GOAL:   Patient will meet greater than or equal to 90% of their needs  MONITOR:   PO intake, Labs, Weight trends, I & O's  REASON FOR ASSESSMENT:   Consult Assessment of nutrition requirement/status  ASSESSMENT:   Pt with hx of HTN, hx CVA, GERD, tobacco abuse, CAD, and bipolar disorder presented to ED from his SNF with worsening SOB.  RD working remotely  Pt recently transferred to the ICU at the time of assessment. Currently on BiPAP support for worsening respiratory status, unable to speak to pt on room phone at this time. RN just received pt and unsure how intake has been today but reports that pt said she did eat. No intake recorded in flowsheet.   Pending respiratory status and length of BiPAP, pt may benefit from having post-pyloric small bore tube placed in order to provide enteral nutrition. If pt is allowed to keep PO diet will add PO supplements as an easy source of quick nutrition.   Admit / Current weight: 70.8 kg  ~5% weight loss noted in chart since 10/2022 which is not significant. However, pt has gained weight per records over the last 2 years.  Nutritionally Relevant Medications: Scheduled Meds:  atorvastatin  10 mg Oral Daily   methylPREDNISolone injection  125 mg Intravenous Q12H   Continuous Infusions:  azithromycin     cefTRIAXone (ROCEPHIN)  IV 1 g (08/08/23 0151)   dextrose 5 % and 0.45 % NaCl  with KCl 20 mEq/L     sodium chloride     Labs Reviewed  NUTRITION - FOCUSED PHYSICAL EXAM: Defer to follow-up assessment  Diet Order:   Diet Order             Diet Heart Room service appropriate? Yes; Fluid consistency: Thin  Diet effective now                   EDUCATION NEEDS:  No education needs have been identified at this time  Skin:  Skin Assessment: Reviewed RN Assessment  Last BM:  10/19  Height:  Ht Readings from Last 1 Encounters:  08/07/23 5\' 3"  (1.6 m)    Weight:  Wt Readings from Last 1 Encounters:  08/07/23 70.8 kg    Ideal Body Weight:  52.3 kg  BMI:  Body mass index is 27.65 kg/m.  Estimated Nutritional Needs:  Kcal:  1600-1800 kcal/d Protein:  75-90g/d Fluid:  >/=1.6L/d    Greig Castilla, RD, LDN Clinical Dietitian RD pager # available in [facility]  After hours/weekend pager # available in [facility]

## 2023-08-08 NOTE — Progress Notes (Signed)
Notified Lab that ABG being sent for analysis. 

## 2023-08-08 NOTE — Progress Notes (Signed)
PT has Sp02 95% on 2 LPM, has moderate expiratory wheeze (mainly in throat)- RN states PT has good urine output (lower extremities have no pitted edema at this time), RR 20. PT reports she is in pain (lower back) RN is aware. RN confirms PT has received 1 dose of steroids. RN states that MD has seen PT earlier but is going to reach back out to him (may need additional interventions). PT states she has been coughing but seems minimal at this time.

## 2023-08-08 NOTE — Progress Notes (Signed)
OT Cancellation Note  Patient Details Name: Danielle [name] MRN: [id number] DOB: 11-Jun-1951   Cancelled Treatment:    Reason Eval/Treat Not Completed: Patient not medically ready Patient up in recliner in room with noted increased SOB with nurse present in room. Patient asking to be checked back on later today. OT to continue to follow.  Rosalio Loud, MS Acute Rehabilitation Department Office# [phone]  08/08/2023, 10:02 AM

## 2023-08-08 NOTE — Progress Notes (Signed)
RN transferred PT off BiPAP. RT retrieved BiPAP from 1420 and transported equipment to 1238. Placed PT back on BiPAP (vitals and settings are on Flowsheet). ICU RN and RT are aware PT has arrived to 1238.

## 2023-08-08 NOTE — Progress Notes (Signed)
PROGRESS NOTE    Danielle [name]  DGL:[id number] DOB: 29-Jan-1951 DOA: 08/07/2023 PCP: [facility]   Brief Narrative:  HPI per Dr. Earlie Lou Danielle [name] is a 72 y.o. female with medical history significant of bipolar disorder, coronary artery disease, GERD, essential hypertension, history of CVA, tobacco abuse, who presents was shortness of breath and cough.  Patient has been having progressive shortness of breath for the last few days.  He is a resident of his facility.  Patient was having increasing wheezing and shortness of breath and EMS arrived oxygen was in the 70s.  Was given magnesium as well as Solu-Medrol 125 mg by EMS and brought to the ER.  In the ER she continues to have significant shortness of breath and wheezing.  Also cough.  Patient has been admitted with acute on chronic hypoxic respiratory failure probably due to COPD exacerbation.   **Interim History She continues to significantly wheeze and had to be placed on BiPAP and transferred to the stepdown unit.  Lactic acid level was elevated but slowly trending down.  Respiratory virus panel was positive for parainfluenza virus 4.  Will continue steroids for now  Assessment and Plan:  Acute Hypoxic Respiratory Failure with Hypoxia in the setting of Acute COPD Exacerbation from Parainfluenza Virus 4 requiring NIPPV with BiPAP -Admitted to the PCU but moved to SDU given Worsening Respiratory Status -Add Azithromycin 500 mg IV and Continue IV Ceftriaxone -Start Arfomoterol 15 mcg Neb BID and Budesonide 0.25 mg Neb BID -Start Xopenex and Atrovent q6h with Albuterol Neb 3 mL q4hprn Wheezing and SOB -Guaifenesin 1200 mg po BID, Flutter Valve, and Incentive Spirometry  -Given Solumedrol 125 mg and will continue at 60 mg q12h -SpO2: 98 % O2 Flow Rate (L/min): 2 L/min FiO2 (%): 28 % -Continuous Pulse Oximetry and Maintain O2 Saturations >90% -Continue Supplemental O2 via [place] and [place] O2 as  Tolerated -Respiratory Virus Panel Positive for Parainfluenza Virus 4 -CXR showed no Active Disease -Repeat CXR in the AM and continue to Monitor Respiratory Status  COPD Exacerbation -Continue treatment as above -Continue to monitor.   Tobacco Abuse -Smoking Cessation Counseling given   -C/w Nicotine 21 mg TD   Hyperlipidemia -Continue Atorvastatin 10 mg po Daily.   History of polysubstance abuse -Patient said no recent use.  Continue to monitor.   Schizophrenia -Will continue home regimen with Sertraline 100 mg po Daily, Quetiapine 200 mg po at bedtime, Valbenazine 80 mg po Daily and Hydroxyzine 25 mg po BIDprn Anxiety    Essential Hypertension -Continue home regimen with Amlodipine 5 mg po Daily  -Continue monitor blood pressures per protocol -Last blood pressure reading was 154/70   Coronary Artery Disease -Stable. No new issues.  Lactic Acidosis -Lactic Acid Level Trend: Recent Labs  Lab 08/07/23 1859 08/07/23 2201 08/08/23 0928 08/08/23 1317  LATICACIDVEN 2.5* 3.7* 2.6* 2.4*  -Given IVF Bolus and start D5 1/2 NS + 20 mEQ KCL at 40 mL/hr x 1 day -Continue to Monitor and Trend and repeat Lactic Acid Level in the AM  Metabolic Acidosis -Mild as CO2 is 21, AG is 11, Chloride Level is 105 -Continue to Monitor and Trend and repeat CMP in the AM   Overweight -Complicates overall prognosis and care -Estimated body mass index is 27.65 kg/m as calculated from the following:   Height as of this encounter: 5\' 3"  (1.6 m).   Weight as of this encounter: 70.8 kg.  -Weight Loss and Dietary Counseling given   DVT prophylaxis:  enoxaparin (LOVENOX) injection 40 mg Start: 08/08/23 1000    Code Status: Full Code Family Communication: No family currently at bedside  Disposition Plan:  Level of care: Stepdown Status is: Inpatient Remains inpatient appropriate because: Needs to be transferred to the stepdown unit given her worsening respiratory status   Consultants:   None  Procedures:  As delineated as above  Antimicrobials:  Anti-infectives (From admission, onward)    Start     Dose/Rate Route Frequency Ordered Stop   08/08/23 1130  azithromycin (ZITHROMAX) 500 mg in sodium chloride 0.9 % 250 mL IVPB        500 mg 250 mL/hr over 60 Minutes Intravenous Every 24 hours 08/08/23 1036     08/08/23 0106  cefTRIAXone (ROCEPHIN) 1 g in sodium chloride 0.9 % 100 mL IVPB        1 g 200 mL/hr over 30 Minutes Intravenous Every 24 hours 08/08/23 0106 08/13/23 0159       Subjective: Seen and examined at bedside and she was extremely short of breath and dyspneic.  States that she has been feeling bad for few days and is gotten worse.  Continues to have significant wheezing.  Denied nausea but having some pain.  No other concerns or complaints at this time.  Objective: Vitals:   08/08/23 1433 08/08/23 1539 08/08/23 1600 08/08/23 1700  BP:   (!) 144/73 (!) 154/70  Pulse: (!) 106  100 97  Resp: (!) 25 (!) 26 (!) 22 20  Temp:      TempSrc:      SpO2: 96% 95% 97% 98%  Weight:      Height:        Intake/Output Summary (Last 24 hours) at 08/08/2023 1828 Last data filed at 08/08/2023 1700 Gross per 24 hour  Intake 1690.69 ml  Output 400 ml  Net 1290.69 ml   Filed Weights   08/07/23 2212  Weight: 70.8 kg   Examination: Physical Exam:  Constitutional: WN/WD overweight AAF in NAD Respiratory: Diminished to auscultation bilaterally with coarse breath sounds with significant wheezing and rhonchi but no appreciable rales or crackles.  Has increased respiratory effort and she is tachypneic and on supplemental oxygen via nasal cannula Cardiovascular: Slightly tachycardic rate but regular rhythm, no murmurs / rubs / gallops. S1 and S2 auscultated.  Mild 1+ extremity edema Abdomen: Soft, non-tender, distended secondary to body habitus y. Bowel sounds positive.  GU: Deferred. Musculoskeletal: No clubbing / cyanosis of digits/nails. No joint deformity upper  and lower extremities.  Skin: No rashes, lesions, ulcers on limited skin evaluation. No induration; Warm and dry.  Neurologic: CN 2-12 grossly intact with no focal deficits. Romberg sign and cerebellar reflexes not assessed.  Psychiatric: Normal judgment and insight. Alert and oriented x 3. Normal mood and appropriate affect.   Data Reviewed: I have personally reviewed following labs and imaging studies  CBC: Recent Labs  Lab 08/07/23 1843 08/08/23 0446  WBC 8.3 4.4  NEUTROABS 3.8  --   HGB 13.4 12.9  HCT 39.9 39.3  MCV 92.6 93.6  PLT 222 211   Basic Metabolic Panel: Recent Labs  Lab 08/07/23 1843 08/08/23 0446  NA 138 137  K 3.2* 3.7  CL 104 105  CO2 24 21*  GLUCOSE 125* 151*  BUN 12 15  CREATININE 0.81 0.77  CALCIUM 9.1 9.4   GFR: Estimated Creatinine Clearance: 61.8 mL/min (by C-G formula based on SCr of 0.77 mg/dL). Liver Function Tests: Recent Labs  Lab 08/07/23 1843 08/08/23  0446  AST 20 18  ALT 13 14  ALKPHOS 64 62  BILITOT 0.5 0.3  PROT 6.6 6.6  ALBUMIN 3.8 3.6   No results for input(s): "LIPASE", "AMYLASE" in the last 168 hours. No results for input(s): "AMMONIA" in the last 168 hours. Coagulation Profile: No results for input(s): "INR", "PROTIME" in the last 168 hours. Cardiac Enzymes: No results for input(s): "CKTOTAL", "CKMB", "CKMBINDEX", "TROPONINI" in the last 168 hours. BNP (last 3 results) No results for input(s): "PROBNP" in the last 8760 hours. HbA1C: No results for input(s): "HGBA1C" in the last 72 hours. CBG: No results for input(s): "GLUCAP" in the last 168 hours. Lipid Profile: No results for input(s): "CHOL", "HDL", "LDLCALC", "TRIG", "CHOLHDL", "LDLDIRECT" in the last 72 hours. Thyroid Function Tests: No results for input(s): "TSH", "T4TOTAL", "FREET4", "T3FREE", "THYROIDAB" in the last 72 hours. Anemia Panel: No results for input(s): "VITAMINB12", "FOLATE", "FERRITIN", "TIBC", "IRON", "RETICCTPCT" in the last 72 hours. Sepsis  Labs: Recent Labs  Lab 08/07/23 1859 08/07/23 2201 08/08/23 0928 08/08/23 1317  LATICACIDVEN 2.5* 3.7* 2.6* 2.4*    Recent Results (from the past 240 hour(s))  Culture, blood (routine x 2)     Status: None (Preliminary result)   Collection Time: 08/07/23  7:02 PM   Specimen: BLOOD  Result Value Ref Range Status   Specimen Description   Final    BLOOD BLOOD LEFT HAND Performed at [facility], [address], [place], [place] [address]    Special Requests   Final    Blood Culture adequate volume BOTTLES DRAWN AEROBIC AND ANAEROBIC Performed at [facility], [address], [place], [place] [address]    Culture   Final    NO GROWTH < 12 HOURS Performed at [facility], [address] [place], [place] [address]    Report Status PENDING  Incomplete  Resp panel by RT-PCR (RSV, Flu A&B, Covid) Anterior Nasal Swab     Status: None   Collection Time: 08/07/23  7:06 PM   Specimen: Anterior Nasal Swab  Result Value Ref Range Status   SARS Coronavirus 2 by RT PCR NEGATIVE NEGATIVE Final    Comment: (NOTE) SARS-CoV-2 target nucleic acids are NOT DETECTED.  The SARS-CoV-2 RNA is generally detectable in upper respiratory specimens during the acute phase of infection. The lowest concentration of SARS-CoV-2 viral copies this assay can detect is 138 copies/mL. A negative result does not preclude SARS-Cov-2 infection and should not be used as the sole basis for treatment or other patient management decisions. A negative result may occur with  improper specimen collection/handling, submission of specimen other than nasopharyngeal swab, presence of viral mutation(s) within the areas targeted by this assay, and inadequate number of viral copies(<138 copies/mL). A negative result must be combined with clinical observations, patient history, and epidemiological information. The expected result is Negative.  Fact Sheet for Patients:   BloggerCourse.com  Fact Sheet for Healthcare Providers:  SeriousBroker.it  This test is no t yet approved or cleared by the [place] FDA and  has been authorized for detection and/or diagnosis of SARS-CoV-2 by FDA under an Emergency Use Authorization (EUA). This EUA will remain  in effect (meaning this test can be used) for the duration of the COVID-19 declaration under Section 564(b)(1) of the Act, 21 U.S.C.section 360bbb-3(b)(1), unless the authorization is terminated  or revoked sooner.       Influenza A by PCR NEGATIVE NEGATIVE Final   Influenza B by PCR NEGATIVE  NEGATIVE Final    Comment: (NOTE) The Xpert Xpress SARS-CoV-2/FLU/RSV plus assay is intended as an aid in the diagnosis of influenza from Nasopharyngeal swab specimens and should not be used as a sole basis for treatment. Nasal washings and aspirates are unacceptable for Xpert Xpress SARS-CoV-2/FLU/RSV testing.  Fact Sheet for Patients: BloggerCourse.com  Fact Sheet for Healthcare Providers: SeriousBroker.it  This test is not yet approved or cleared by the [place] FDA and has been authorized for detection and/or diagnosis of SARS-CoV-2 by FDA under an Emergency Use Authorization (EUA). This EUA will remain in effect (meaning this test can be used) for the duration of the COVID-19 declaration under Section 564(b)(1) of the Act, 21 U.S.C. section 360bbb-3(b)(1), unless the authorization is terminated or revoked.     Resp Syncytial Virus by PCR NEGATIVE NEGATIVE Final    Comment: (NOTE) Fact Sheet for Patients: BloggerCourse.com  Fact Sheet for Healthcare Providers: SeriousBroker.it  This test is not yet approved or cleared by the [place] FDA and has been authorized for detection and/or diagnosis of SARS-CoV-2 by FDA under an Emergency Use  Authorization (EUA). This EUA will remain in effect (meaning this test can be used) for the duration of the COVID-19 declaration under Section 564(b)(1) of the Act, 21 U.S.C. section 360bbb-3(b)(1), unless the authorization is terminated or revoked.  Performed at [facility], [address] [place], [place] [address]   Culture, blood (routine x 2)     Status: None (Preliminary result)   Collection Time: 08/07/23  8:30 PM   Specimen: BLOOD  Result Value Ref Range Status   Specimen Description   Final    BLOOD Performed at [facility], [address] [place], [place] [address]    Special Requests   Final    BLOOD RIGHT FOREARM BOTTLES DRAWN AEROBIC AND ANAEROBIC Performed at [facility], [address], [place], [place] [address]    Culture   Final    NO GROWTH < 12 HOURS Performed at [facility], [address], [place], [place] [address]    Report Status PENDING  Incomplete  Respiratory (~20 pathogens) panel by PCR     Status: Abnormal   Collection Time: 08/08/23 10:30 AM   Specimen: Nasopharyngeal Swab; Respiratory  Result Value Ref Range Status   Adenovirus NOT DETECTED NOT DETECTED Final   Coronavirus 229E NOT DETECTED NOT DETECTED Final    Comment: (NOTE) The Coronavirus on the Respiratory Panel, DOES NOT test for the novel  Coronavirus (2019 nCoV)    Coronavirus HKU1 NOT DETECTED NOT DETECTED Final   Coronavirus NL63 NOT DETECTED NOT DETECTED Final   Coronavirus OC43 NOT DETECTED NOT DETECTED Final   Metapneumovirus NOT DETECTED NOT DETECTED Final   Rhinovirus / Enterovirus NOT DETECTED NOT DETECTED Final   Influenza A NOT DETECTED NOT DETECTED Final   Influenza B NOT DETECTED NOT DETECTED Final   Parainfluenza Virus 1 NOT DETECTED NOT DETECTED Final   Parainfluenza Virus 2 NOT DETECTED NOT DETECTED Final   Parainfluenza Virus 3 NOT DETECTED NOT DETECTED Final   Parainfluenza Virus 4 DETECTED (A)  NOT DETECTED Final   Respiratory Syncytial Virus NOT DETECTED NOT DETECTED Final   Bordetella pertussis NOT DETECTED NOT DETECTED Final   Bordetella Parapertussis NOT DETECTED NOT DETECTED Final   Chlamydophila pneumoniae NOT DETECTED NOT DETECTED Final   Mycoplasma pneumoniae NOT DETECTED NOT DETECTED Final    Comment: Performed at [facility], [address], [place], [place] [address]    Radiology Studies: DG CHEST PORT 1 VIEW  Result Date: 08/08/2023 CLINICAL DATA:  Shortness of breath. EXAM: PORTABLE CHEST 1 VIEW COMPARISON:  August 07, 2023. FINDINGS: The heart size and mediastinal contours are within normal limits. Both lungs are clear. The visualized skeletal structures are unremarkable. IMPRESSION: No active disease. Electronically Signed   By: Lupita Raider M.D.   On: 08/08/2023 15:52   DG Chest Port 1 View  Result Date: 08/07/2023 CLINICAL DATA:  Shortness of breath. Decreased oxygen saturation. Wheezing. EXAM: PORTABLE CHEST 1 VIEW COMPARISON:  10/15/2022 FINDINGS: Examination is somewhat limited due to patient rotation. As visualized, the heart size and pulmonary vascularity appear normal. Peribronchial thickening with some interstitial changes centrally are consistent with airways disease. No focal consolidation or infiltrate. No pleural effusions. No pneumothorax. Degenerative changes in the spine. IMPRESSION: Peribronchial thickening and central interstitial changes or consistent with airways disease. No focal consolidation. Electronically Signed   By: Burman Nieves M.D.   On: 08/07/2023 20:28    Scheduled Meds:  amLODipine  5 mg Oral Daily   arformoterol  15 mcg Nebulization BID   atorvastatin  10 mg Oral Daily   budesonide (PULMICORT) nebulizer solution  0.25 mg Nebulization BID   Chlorhexidine Gluconate Cloth  6 each Topical Daily   enoxaparin (LOVENOX) injection  40 mg Subcutaneous Q24H   guaiFENesin  1,200 mg Oral BID   ipratropium  0.5 mg Nebulization Q6H    latanoprost  1 drop Both Eyes QHS   levalbuterol  0.63 mg Nebulization Q6H   loratadine  10 mg Oral Daily   [START ON 08/09/2023] methylPREDNISolone (SOLU-MEDROL) injection  60 mg Intravenous Q12H   nicotine  21 mg Transdermal Daily   pregabalin  75 mg Oral TID   QUEtiapine  200 mg Oral QHS   sertraline  100 mg Oral Daily   valbenazine  80 mg Oral Daily   Continuous Infusions:  azithromycin Stopped (08/08/23 1246)   cefTRIAXone (ROCEPHIN)  IV Stopped (08/08/23 0223)   dextrose 5 % and 0.45 % NaCl with KCl 20 mEq/L 40 mL/hr at 08/08/23 1700    LOS: 1 day   Marguerita Merles, DO [facility] Available via Epic secure chat 7am-7pm After these hours, please refer to coverage provider listed on [email or web address] 08/08/2023, 6:28 PM

## 2023-08-08 NOTE — Plan of Care (Signed)

## 2023-08-08 NOTE — Progress Notes (Signed)
   08/08/23 2002  BiPAP/CPAP/SIPAP  BiPAP/CPAP/SIPAP Pt Type Adult  BiPAP/CPAP/SIPAP V60  Mask Type Full face mask  Mask Size Medium  Set Rate 8 breaths/min  Respiratory Rate 31 breaths/min  IPAP 12 cmH20  EPAP 5 cmH2O  FiO2 (%) 28 %  Minute Ventilation 14.1  Leak 0  Peak Inspiratory Pressure (PIP) 13  Tidal Volume (Vt) 385  Auto Titrate No  Press High Alarm 30 cmH2O  Press Low Alarm 5 cmH2O

## 2023-08-08 NOTE — Evaluation (Addendum)
Occupational Therapy Evaluation Patient Details Name: Danielle [name] MRN: [id number] DOB: March 30, 1951 Today's Date: 08/08/2023   History of Present Illness Danielle [name] is a 72 y.o. female presents with SOB and cough. Pt admitted with acute on chronic hypoxic respiratory failure. PMH: COPD, schizophrenia, HTN, CAD, bipolar, stroke, tardive dyskinesia   Clinical Impression   Patient is a 72 year old female who was admitted for above. Patient was living at ALF independently at rollator level. Currently, patient was very limited with increased SOB with activity. Patient's nurse is aware. Patient was noted to have decreased functional activity tolerance, decreased endurance, decreased standing balance, decreased safety awareness, and decreased knowledge of AD/AE impacting participation in ADLs. Patient will benefit from continued inpatient follow up therapy, <3 hours/day       If plan is discharge home, recommend the following: Two people to help with walking and/or transfers;A lot of help with bathing/dressing/bathroom;Assistance with cooking/housework;Direct supervision/assist for medications management;Assist for transportation;Help with stairs or ramp for entrance;Direct supervision/assist for financial management    Functional Status Assessment  Patient has had a recent decline in their functional status and demonstrates the ability to make significant improvements in function in a reasonable and predictable amount of time.  Equipment Recommendations  None recommended by OT       Precautions / Restrictions Precautions Precautions: Fall Precaution Comments: monitor O2 and HR Restrictions Weight Bearing Restrictions: No         Balance Overall balance assessment: Mild deficits observed, not formally tested         ADL either performed or assessed with clinical judgement   ADL Overall ADL's : Needs assistance/impaired Eating/Feeding: Modified  independent;Sitting Eating/Feeding Details (indicate cue type and reason): noted to have open mouth breathing with dry mouth reported. nurse made aware. eduation provided on deep breathing through nose. Grooming: Sitting;Minimal assistance   Upper Body Bathing: Minimal assistance;Bed level   Lower Body Bathing: Bed level;Maximal assistance   Upper Body Dressing : Bed level;Moderate assistance   Lower Body Dressing: Bed level;Maximal assistance   Toilet Transfer: +2 for safety/equipment;+2 for physical assistance Toilet Transfer Details (indicate cue type and reason): patient was +2 for standing EOB to take few steps to reposition Toileting- Clothing Manipulation and Hygiene: Bed level;Maximal assistance               Vision   Vision Assessment?: No apparent visual deficits         Extremity/Trunk Assessment Upper Extremity Assessment Upper Extremity Assessment: Right hand dominant;Generalized weakness (limited tolerance of shoulder movement with SOB and accessory muscles working)   Lower Extremity Assessment Lower Extremity Assessment: Defer to PT evaluation   Cervical / Trunk Assessment Cervical / Trunk Assessment: Normal   Communication Communication Communication: No apparent difficulties   Cognition Arousal: Alert Behavior During Therapy: WFL for tasks assessed/performed, Anxious Overall Cognitive Status: Within Functional Limits for tasks assessed                        Home Living Family/patient expects to be discharged to:: Assisted living ([facility])   Home Equipment: Rollator (4 wheels)          Prior Functioning/Environment Prior Level of Function : Independent/Modified Independent             Mobility Comments: pt reports ind with rollator in facility, pt reports 1 fall getting OOB to bathroom ADLs Comments: pt reports ind with showering, toileting and dressing; facility provides household chores and meals  OT Problem  List: Decreased activity tolerance;Impaired balance (sitting and/or standing);Decreased coordination;Decreased safety awareness;Decreased knowledge of use of DME or AE;Decreased knowledge of precautions;Cardiopulmonary status limiting activity      OT Treatment/Interventions: Therapeutic activities;Neuromuscular education;DME and/or AE instruction;Patient/family education;Balance training;Self-care/ADL training    OT Goals(Current goals can be found in the care plan section) Acute Rehab OT Goals Patient Stated Goal: to get breathign better OT Goal Formulation: With patient Time For Goal Achievement: 08/22/23 Potential to Achieve Goals: Fair  OT Frequency: Min 1X/week    Co-evaluation   Reason for Co-Treatment: For patient/therapist safety;To address functional/ADL transfers PT goals addressed during session: Mobility/safety with mobility;Balance;Proper use of DME        AM-PAC OT "6 Clicks" Daily Activity     Outcome Measure Help from another person eating meals?: A Little Help from another person taking care of personal grooming?: A Little Help from another person toileting, which includes using toliet, bedpan, or urinal?: A Lot Help from another person bathing (including washing, rinsing, drying)?: A Lot Help from another person to put on and taking off regular upper body clothing?: A Lot Help from another person to put on and taking off regular lower body clothing?: A Lot 6 Click Score: 14   End of Session Equipment Utilized During Treatment: Gait belt;Rolling walker (2 wheels) Nurse Communication: Mobility status;Other (comment) (concerns over SOB)  Activity Tolerance: Patient limited by fatigue;Other (comment) (increased SOB) Patient left: in bed;with call bell/phone within reach;with bed alarm set  OT Visit Diagnosis: Unsteadiness on feet (R26.81);Other abnormalities of gait and mobility (R26.89);History of falling (Z91.81)                Time: 4401-0272 OT Time  Calculation (min): 13 min Charges:  OT General Charges $OT Visit: 1 Visit OT Evaluation $OT Eval Low Complexity: 1 Low  Dulcy Sida OTR/L, MS Acute Rehabilitation Department Office# [phone]   Selinda Flavin 08/08/2023, 4:32 PM

## 2023-08-08 NOTE — Hospital Course (Signed)
HPI per Dr. Earlie Lou Danielle [name] is a 72 y.o. female with medical history significant of bipolar disorder, coronary artery disease, GERD, essential hypertension, history of CVA, tobacco abuse, who presents was shortness of breath and cough.  Patient has been having progressive shortness of breath for the last few days.  He is a resident of his facility.  Patient was having increasing wheezing and shortness of breath and EMS arrived oxygen was in the 70s.  Was given magnesium as well as Solu-Medrol 125 mg by EMS and brought to the ER.  In the ER she continues to have significant shortness of breath and wheezing.  Also cough.  Patient has been admitted with acute on chronic hypoxic respiratory failure probably due to COPD exacerbation.   **Interim History She continues to significantly wheeze and had to be placed on BiPAP and transferred to the stepdown unit.  Lactic acid level was elevated but slowly trending down.  Respiratory virus panel was positive for parainfluenza virus 4.  Will continue steroids for now  Assessment and Plan:  Acute Hypoxic Respiratory Failure with Hypoxia in the setting of Acute COPD Exacerbation from Parainfluenza Virus 4 requiring NIPPV with BiPAP -Admitted to the PCU but moved to SDU given Worsening Respiratory Status -Add Azithromycin 500 mg IV and Continue IV Ceftriaxone -Start Arfomoterol 15 mcg Neb BID and Budesonide 0.25 mg Neb BID -Start Xopenex and Atrovent q6h with Albuterol Neb 3 mL q4hprn Wheezing and SOB -Guaifenesin 1200 mg po BID, Flutter Valve, and Incentive Spirometry  -Given Solumedrol 125 mg and will continue at 60 mg q12h -SpO2: 98 % O2 Flow Rate (L/min): 2 L/min FiO2 (%): 28 % -Continuous Pulse Oximetry and Maintain O2 Saturations >90% -Continue Supplemental O2 via [place] and [place] O2 as Tolerated -Respiratory Virus Panel Positive for Parainfluenza Virus 4 -CXR showed no Active Disease -Repeat CXR in the AM and continue to Monitor Respiratory  Status  COPD Exacerbation -Continue treatment as above -Continue to monitor.   Tobacco Abuse -Smoking Cessation Counseling given   -C/w Nicotine 21 mg TD   Hyperlipidemia -Continue Atorvastatin 10 mg po Daily.   History of polysubstance abuse -Patient said no recent use.  Continue to monitor.   Schizophrenia -Will continue home regimen with Sertraline 100 mg po Daily, Quetiapine 200 mg po at bedtime, Valbenazine 80 mg po Daily and Hydroxyzine 25 mg po BIDprn Anxiety    Essential Hypertension -Continue home regimen with Amlodipine 5 mg po Daily  -Continue monitor blood pressures per protocol -Last blood pressure reading was 154/70   Coronary Artery Disease -Stable. No new issues.  Lactic Acidosis -Lactic Acid Level Trend: Recent Labs  Lab 08/07/23 1859 08/07/23 2201 08/08/23 0928 08/08/23 1317  LATICACIDVEN 2.5* 3.7* 2.6* 2.4*  -Given IVF Bolus and start D5 1/2 NS + 20 mEQ KCL at 40 mL/hr x 1 day -Continue to Monitor and Trend and repeat Lactic Acid Level in the AM  Metabolic Acidosis -Mild as CO2 is 21, AG is 11, Chloride Level is 105 -Continue to Monitor and Trend and repeat CMP in the AM   Overweight -Complicates overall prognosis and care -Estimated body mass index is 27.65 kg/m as calculated from the following:   Height as of this encounter: 5\' 3"  (1.6 m).   Weight as of this encounter: 70.8 kg.  -Weight Loss and Dietary Counseling given

## 2023-08-08 NOTE — Plan of Care (Signed)
?  Problem: Clinical Measurements: ?Goal: Will remain free from infection ?Outcome: Progressing ?  ?Problem: Activity: ?Goal: Risk for activity intolerance will decrease ?Outcome: Progressing ?  ?Problem: Elimination: ?Goal: Will not experience complications related to urinary retention ?Outcome: Progressing ?  ?Problem: Pain Managment: ?Goal: General experience of comfort will improve ?Outcome: Progressing ?  ?Problem: Safety: ?Goal: Ability to remain free from injury will improve ?Outcome: Progressing ?  ?

## 2023-08-08 NOTE — Progress Notes (Signed)
   08/08/23 1539  BiPAP/CPAP/SIPAP  BiPAP/CPAP/SIPAP Pt Type Adult  BiPAP/CPAP/SIPAP V60  Mask Type Full face mask  Mask Size Medium  Set Rate 10 breaths/min  Respiratory Rate 26 breaths/min  IPAP 10 cmH20  EPAP 5 cmH2O  FiO2 (%) 28 %  Minute Ventilation 13.1  Leak 4  Peak Inspiratory Pressure (PIP) 10  Tidal Volume (Vt) 420  Auto Titrate No  BiPAP/CPAP /SiPAP Vitals  Resp (!) 26  SpO2 95 %  Bilateral Breath Sounds Expiratory wheezes  MEWS Score/Color  MEWS Score 3  MEWS Score Color Yellow

## 2023-08-08 NOTE — Progress Notes (Signed)
   08/08/23 0908  Assess: MEWS Score  Temp 98.5 F (36.9 C)  BP 124/63  MAP (mmHg) 80  Pulse Rate (!) 107  Resp (!) 24  SpO2 95 %  Assess: MEWS Score  MEWS Temp 0  MEWS Systolic 0  MEWS Pulse 1  MEWS RR 1  MEWS LOC 0  MEWS Score 2  MEWS Score Color Yellow  Assess: if the MEWS score is Yellow or Red  Were vital signs accurate and taken at a resting state? Yes  Does the patient meet 2 or more of the SIRS criteria? Yes  Does the patient have a confirmed or suspected source of infection? No  MEWS guidelines implemented  Yes, yellow  Treat  MEWS Interventions Considered administering scheduled or prn medications/treatments as ordered  Take Vital Signs  Increase Vital Sign Frequency  Yellow: Q2hr x1, continue Q4hrs until patient remains green for 12hrs  Escalate  MEWS: Escalate Yellow: Discuss with charge nurse and consider notifying provider and/or RRT  Notify: Charge Nurse/RN  Name of Charge Nurse/RN Notified Ova Freshwater  Provider Notification  Provider Name/Title Jones Skene  Date Provider Notified 08/08/23  Time Provider Notified 1015  Method of Notification Face-to-face  Notification Reason Other (Comment) (Yellow MEWS)  Provider response In department  Date of Provider Response 08/08/23  Time of Provider Response 1020  Assess: SIRS CRITERIA  SIRS Temperature  0  SIRS Pulse 1  SIRS Respirations  1  SIRS WBC 0  SIRS Score Sum  2

## 2023-08-08 NOTE — Progress Notes (Signed)
At 1349- RT asked RN to reach out to MD for BiPAP consideration and order if agrees.

## 2023-08-09 DIAGNOSIS — J9601 Acute respiratory failure with hypoxia: Secondary | ICD-10-CM | POA: Diagnosis not present

## 2023-08-09 DIAGNOSIS — E876 Hypokalemia: Secondary | ICD-10-CM | POA: Diagnosis not present

## 2023-08-09 DIAGNOSIS — F1994 Other psychoactive substance use, unspecified with psychoactive substance-induced mood disorder: Secondary | ICD-10-CM | POA: Diagnosis not present

## 2023-08-09 DIAGNOSIS — Z720 Tobacco use: Secondary | ICD-10-CM | POA: Diagnosis not present

## 2023-08-09 DIAGNOSIS — J441 Chronic obstructive pulmonary disease with (acute) exacerbation: Secondary | ICD-10-CM

## 2023-08-09 DIAGNOSIS — E785 Hyperlipidemia, unspecified: Secondary | ICD-10-CM

## 2023-08-09 LAB — PHOSPHORUS: Phosphorus: 2.3 mg/dL — ABNORMAL LOW (ref 2.5–4.6)

## 2023-08-09 LAB — COMPREHENSIVE METABOLIC PANEL
ALT: 17 U/L (ref 0–44)
AST: 18 U/L (ref 15–41)
Albumin: 3.9 g/dL (ref 3.5–5.0)
Alkaline Phosphatase: 66 U/L (ref 38–126)
Anion gap: 8 (ref 5–15)
BUN: 16 mg/dL (ref 8–23)
CO2: 24 mmol/L (ref 22–32)
Calcium: 9.5 mg/dL (ref 8.9–10.3)
Chloride: 106 mmol/L (ref 98–111)
Creatinine, Ser: 0.63 mg/dL (ref 0.44–1.00)
GFR, Estimated: >60 mL/min (ref >60)
Glucose, Bld: 160 mg/dL — ABNORMAL HIGH (ref 70–99)
Potassium: 3.9 mmol/L (ref 3.5–5.1)
Sodium: 138 mmol/L (ref 135–145)
Total Bilirubin: 0.3 mg/dL (ref 0.3–1.2)
Total Protein: 7.3 g/dL (ref 6.5–8.1)

## 2023-08-09 LAB — CBC WITH DIFFERENTIAL/PLATELET
Abs Immature Granulocytes: 0.12 10*3/uL — ABNORMAL HIGH (ref 0.00–0.07)
Basophils Absolute: 0 10*3/uL (ref 0.0–0.1)
Basophils Relative: 0 %
Eosinophils Absolute: 0 10*3/uL (ref 0.0–0.5)
Eosinophils Relative: 0 %
HCT: 40.4 % (ref 36.0–46.0)
Hemoglobin: 13.4 g/dL (ref 12.0–15.0)
Immature Granulocytes: 1 %
Lymphocytes Relative: 5 %
Lymphs Abs: 0.9 10*3/uL (ref 0.7–4.0)
MCH: 30.2 pg (ref 26.0–34.0)
MCHC: 33.2 g/dL (ref 30.0–36.0)
MCV: 91 fL (ref 80.0–100.0)
Monocytes Absolute: 0.7 10*3/uL (ref 0.1–1.0)
Monocytes Relative: 4 %
Neutro Abs: 15.5 10*3/uL — ABNORMAL HIGH (ref 1.7–7.7)
Neutrophils Relative %: 90 %
Platelets: 226 10*3/uL (ref 150–400)
RBC: 4.44 MIL/uL (ref 3.87–5.11)
RDW: 14.6 % (ref 11.5–15.5)
WBC: 17.3 10*3/uL — ABNORMAL HIGH (ref 4.0–10.5)
nRBC: 0 % (ref 0.0–0.2)

## 2023-08-09 LAB — LACTIC ACID, PLASMA
Lactic Acid, Venous: 1.4 mmol/L (ref 0.5–1.9)
Lactic Acid, Venous: 4.1 mmol/L (ref 0.5–1.9)
Lactic Acid, Venous: 5.2 mmol/L (ref 0.5–1.9)
Lactic Acid, Venous: 2 mmol/L (ref 0.5–1.9)

## 2023-08-09 LAB — MAGNESIUM: Magnesium: 2.3 mg/dL (ref 1.7–2.4)

## 2023-08-09 MED ORDER — PANTOPRAZOLE SODIUM 40 MG PO TBEC
40.0000 mg | DELAYED_RELEASE_TABLET | Freq: Every day | ORAL | Status: DC
  Administered 2023-08-09 – 2023-08-15 (×7): 40 mg via ORAL
  Filled 2023-08-09 (×7): qty 1

## 2023-08-09 MED ORDER — METHYLPREDNISOLONE SODIUM SUCC 125 MG IJ SOLR
125.0000 mg | Freq: Two times a day (BID) | INTRAMUSCULAR | Status: DC
  Administered 2023-08-09: 125 mg via INTRAVENOUS
  Filled 2023-08-09: qty 2

## 2023-08-09 MED ORDER — K PHOS MONO-SOD PHOS DI & MONO 155-852-130 MG PO TABS
500.0000 mg | ORAL_TABLET | Freq: Once | ORAL | Status: AC
  Administered 2023-08-09: 500 mg via ORAL
  Filled 2023-08-09: qty 2

## 2023-08-09 MED ORDER — SODIUM CHLORIDE 0.9 % IV BOLUS
1000.0000 mL | Freq: Once | INTRAVENOUS | Status: AC
  Administered 2023-08-09: 1000 mL via INTRAVENOUS

## 2023-08-09 MED ORDER — SODIUM CHLORIDE 0.9 % IV BOLUS: 1000.0000 mL | Freq: Once | INTRAVENOUS | Status: DC

## 2023-08-09 NOTE — Plan of Care (Signed)

## 2023-08-09 NOTE — Progress Notes (Signed)
Patient having increased work of breathing and severe wheezing- RR 40/minute on 2L [place]. Per previous RN, patient woke up startled and trying to get out of bed and developed severe wheeze at shift change. Reoriented patient and assisted to sit on side of bed. Scheduled Atrovent and Xopenex administered, work of breathing improved and placed back on BIPAP. Patient states breathing has improved since nebulizer.

## 2023-08-09 NOTE — [place] FL2 (Signed)
[place] MEDICAID FL2 LEVEL OF CARE FORM     IDENTIFICATION  Patient Name: Danielle [name] Birthdate: 1951/02/28 Sex: female Admission Date (Current Location): 08/07/2023  [place] and [place] Number:  [facility] Bast [id number] K Facility and Address:  [facility],  [address], [place] [address]      Provider Number: [id number]  Attending Physician Name and Address:  Merlene Laughter, DO  Relative Name and Phone Number:  CAMBRIDGE,VERONICA Daughter   [phone]    Current Level of Care: Hospital Recommended Level of Care: Assisted Living Facility Prior Approval Number:    Date Approved/Denied:   PASRR Number:    Discharge Plan: Other (Comment) ([facility])    Current Diagnoses: Patient Active Problem List   Diagnosis Date Noted   Acute hypoxemic respiratory failure (HCC) 08/07/2023   COPD exacerbation (HCC) 10/16/2022   COPD with acute exacerbation (HCC) 10/15/2022   Acute respiratory failure with hypoxia (HCC) 10/15/2022   GI bleeding 05/11/2022   Acute blood loss anemia 05/11/2022   Major depressive disorder, recurrent episode, moderate (HCC)    Dysarthria 12/29/2020   AKI (acute kidney injury) (HCC) 12/29/2020   CAD (coronary artery disease) 03/22/2019   Unstable angina (HCC)    Hypokalemia 08/11/2018   Tobacco abuse 08/11/2018   Memory disturbance 01/30/2016   Schizophrenia, paranoid type (HCC) 04/28/2012   Cocaine abuse (HCC) 04/28/2012   Cannabis abuse 04/28/2012   Substance induced mood disorder (HCC) 04/28/2012   Noncompliance 09/16/2011   Anxiety 09/16/2011   Constipation 09/16/2011   Polysubstance abuse (HCC) 12/03/2008   Hyperlipidemia 08/01/2008   MIGRAINE HEADACHE 08/01/2008   ASTHMA 08/01/2008   DEPRESSION 06/20/2008   ANXIETY DISORDER, GENERALIZED 05/28/2008   HYPERTENSION, BENIGN ESSENTIAL 05/28/2008   GERD 05/28/2008   DEEP VENOUS THROMBOPHLEBITIS, LEFT, LEG, HX OF 10/18/1988    Orientation RESPIRATION BLADDER  Height & Weight     Self, Time, Situation, Place  O2 (2L) Incontinent Weight: 156 lb 1.4 oz (70.8 kg) Height:  5\' 3"  (160 cm)  BEHAVIORAL SYMPTOMS/MOOD NEUROLOGICAL BOWEL NUTRITION STATUS      Continent Diet  AMBULATORY STATUS COMMUNICATION OF NEEDS Skin   Supervision Verbally Normal                       Personal Care Assistance Level of Assistance  Bathing, Feeding, Dressing Bathing Assistance: Limited assistance Feeding assistance: Independent Dressing Assistance: Independent     Functional Limitations Info  Sight, Hearing, Speech Sight Info: Adequate Hearing Info: Adequate Speech Info: Adequate    SPECIAL CARE FACTORS FREQUENCY  PT (By licensed PT), OT (By licensed OT)     PT Frequency: Minimum 2x a week OT Frequency: Minimum 2x a week            Contractures Contractures Info: Not present    Additional Factors Info  Code Status, Allergies Code Status Info: Full Code Allergies Info: Clonazepam  Doxycycline  Fosinopril Sodium  Hydrochlorothiazide W-triamterene  Penicillins  Sulfa Antibiotics  Triamterene           Current Medications (08/09/2023):  This is the current hospital active medication list Current Facility-Administered Medications  Medication Dose Route Frequency Provider Last Rate Last Admin   albuterol (PROVENTIL) (2.5 MG/3ML) 0.083% nebulizer solution 2.5 mg  2.5 mg Nebulization Q2H PRN Earlie Lou L, MD   2.5 mg at 08/09/23 0025   albuterol (PROVENTIL) (2.5 MG/3ML) 0.083% nebulizer solution 3 mL  3 mL Inhalation Q4H PRN Rometta Emery, MD  3 mL at 08/09/23 1631   amLODipine (NORVASC) tablet 5 mg  5 mg Oral Daily Rometta Emery, MD   5 mg at 08/09/23 0903   arformoterol (BROVANA) nebulizer solution 15 mcg  15 mcg Nebulization BID Marguerita Merles [place], DO   15 mcg at 08/09/23 0732   atorvastatin (LIPITOR) tablet 10 mg  10 mg Oral Daily Rometta Emery, MD   10 mg at 08/09/23 0903   azithromycin (ZITHROMAX) 500 mg in sodium chloride  0.9 % 250 mL IVPB  500 mg Intravenous Q24H Sheikh, Omair [place], DO   Stopped at 08/09/23 1248   budesonide (PULMICORT) nebulizer solution 0.25 mg  0.25 mg Nebulization BID Marguerita Merles Latif, DO   0.25 mg at 08/09/23 0981   Chlorhexidine Gluconate Cloth 2 % PADS 6 each  6 each Topical Daily Marguerita Merles [place], DO   6 each at 08/09/23 0903   enoxaparin (LOVENOX) injection 40 mg  40 mg Subcutaneous Q24H Earlie Lou L, MD   40 mg at 08/09/23 0903   guaiFENesin (MUCINEX) 12 hr tablet 1,200 mg  1,200 mg Oral BID Marguerita Merles [place], DO   1,200 mg at 08/09/23 1914   HYDROcodone-acetaminophen (NORCO) 10-325 MG per tablet 1 tablet  1 tablet Oral 5 X Daily PRN Rometta Emery, MD   1 tablet at 08/09/23 1032   hydrOXYzine (ATARAX) tablet 25 mg  25 mg Oral BID PRN Rometta Emery, MD   25 mg at 08/08/23 1007   ipratropium (ATROVENT) nebulizer solution 0.5 mg  0.5 mg Nebulization Q6H Sheikh, Omair Latif, DO   0.5 mg at 08/09/23 1346   latanoprost (XALATAN) 0.005 % ophthalmic solution 1 drop  1 drop Both Eyes QHS Earlie Lou L, MD   1 drop at 08/08/23 2150   levalbuterol (XOPENEX) nebulizer solution 0.63 mg  0.63 mg Nebulization Q6H Sheikh, Kateri Mc Latif, DO   0.63 mg at 08/09/23 1346   loratadine (CLARITIN) tablet 10 mg  10 mg Oral Daily Rometta Emery, MD   10 mg at 08/09/23 7829   methylPREDNISolone sodium succinate (SOLU-MEDROL) 125 mg/2 mL injection 125 mg  125 mg Intravenous Q12H Sheikh, Omair Latif, DO       morphine (PF) 2 MG/ML injection 1 mg  1 mg Intravenous Q4H PRN Marguerita Merles Latif, DO   1 mg at 08/09/23 1303   nicotine (NICODERM CQ - dosed in mg/24 hours) patch 21 mg  21 mg Transdermal Daily Earlie Lou L, MD   21 mg at 08/09/23 0904   pantoprazole (PROTONIX) EC tablet 40 mg  40 mg Oral Daily Sheikh, Omair Latif, DO       pregabalin (LYRICA) capsule 75 mg  75 mg Oral TID Rometta Emery, MD   75 mg at 08/09/23 1543   QUEtiapine (SEROQUEL) tablet 200 mg  200 mg Oral QHS Earlie Lou L, MD   200 mg at 08/08/23 2143   sertraline (ZOLOFT) tablet 100 mg  100 mg Oral Daily Earlie Lou L, MD   100 mg at 08/09/23 5621   sodium chloride 0.9 % bolus 1,000 mL  1,000 mL Intravenous Once Sheikh, Omair Latif, DO   Held at 08/09/23 1521   tiZANidine (ZANAFLEX) tablet 2 mg  2 mg Oral Q8H PRN Rometta Emery, MD       valbenazine [facility]) capsule 80 mg  80 mg Oral Daily Rometta Emery, MD   80 mg at 08/09/23 3086     Discharge Medications: Please  see discharge summary for a list of discharge medications.  Relevant Imaging Results:  Relevant Lab Results:   Additional Information SSN [id number]  Darleene Cleaver, LCSW

## 2023-08-09 NOTE — Progress Notes (Signed)
PROGRESS NOTE    Danielle [name]  UJW:[id number] DOB: Jun 05, 1951 DOA: 08/07/2023 PCP: [facility]   Brief Narrative:  HPI per Dr. Earlie Lou Tamaiya [name] is a 72 y.o. female with medical history significant of bipolar disorder, coronary artery disease, GERD, essential hypertension, history of CVA, tobacco abuse, who presents was shortness of breath and cough.  Patient has been having progressive shortness of breath for the last few days.  He is a resident of his facility.  Patient was having increasing wheezing and shortness of breath and EMS arrived oxygen was in the 70s.  Was given magnesium as well as Solu-Medrol 125 mg by EMS and brought to the ER.  In the ER she continues to have significant shortness of breath and wheezing.  Also cough.  Patient has been admitted with acute on chronic hypoxic respiratory failure probably due to COPD exacerbation.   **Interim History She continues to significantly wheeze and had to be placed on BiPAP and transferred to the stepdown unit on 08/08/2023.  Lactic acid level was elevated but slowly trending down and had resolved but trended back up again.  Respiratory virus panel was positive for parainfluenza virus 4.  Will continue steroids for now given her significant wheezing so we will increase the dose to 125 mg twice daily.  She is slowly improving but if she does not make further improvements will need Pulmonary involvement.  Assessment and Plan:  Acute Hypoxic Respiratory Failure with Hypoxia in the setting of Acute COPD Exacerbation from Parainfluenza Virus 4 requiring NIPPV with BiPAP intermittently -Admitted to the PCU but moved to SDU given Worsening Respiratory Status -Continue Azithromycin 500 mg IV and will discontinue IV Ceftriaxone -Start Arfomoterol 15 mcg Neb BID and Budesonide 0.25 mg Neb BID -Start Xopenex and Atrovent q6h with Albuterol Neb 3 mL q4hprn Wheezing and SOB -Guaifenesin 1200 mg po BID, Flutter Valve, and  Incentive Spirometry  -Given Solumedrol 125 mg and will continue at 60 mg q12h today however given her significant wheezing we will go back up to 125 mg twice daily -SpO2: 97 % O2 Flow Rate (L/min): 3 L/min FiO2 (%): 28 % -Continuous Pulse Oximetry and Maintain O2 Saturations >90% -Continue Supplemental O2 via [place] and [place] O2 as Tolerated -Respiratory Virus Panel Positive for Parainfluenza Virus 4 -CXR showed no Active Disease -She will need repeat chest x-ray and ambulatory home O2 screen prior to discharge -If she is not improving may consider pulmonary evaluation  COPD Exacerbation -Continue Treatment as above -Continue to monitor.   Tobacco Abuse -Smoking Cessation Counseling given   -C/w Nicotine 21 mg TD   Hyperlipidemia -Continue Atorvastatin 10 mg po Daily.   History of polysubstance abuse -Patient said no recent use.  Continue to monitor.   Schizophrenia -Will continue home regimen with Sertraline 100 mg po Daily, Quetiapine 200 mg po at bedtime, Valbenazine 80 mg po Daily and Hydroxyzine 25 mg po BIDprn Anxiety    Essential Hypertension -Continue home regimen with Amlodipine 5 mg po Daily  -Continue monitor blood pressures per protocol -Last blood pressure reading was 143/89   Coronary Artery Disease -Stable. No new issues.  Lactic Acidosis likely Type B in the setting of ? Xopenex Administration, improving  -Lactic Acid Level Trend: Recent Labs  Lab 08/07/23 2201 08/08/23 0928 08/08/23 1317 08/09/23 0848 08/09/23 1037 08/09/23 1320 08/09/23 1445  LATICACIDVEN 3.7* 2.6* 2.4* 1.4 4.1* 5.2* 2.0*  -Given another 1 Liter IVF Bolus  -Continue to Monitor and Trend and repeat Lactic  Acid Level in the AM  Hypophosphatemia -Phos Level Trend: Recent Labs  Lab 08/09/23 0340  PHOS 2.3*  -Replete po K Phos 500 mg po x1   -Continue to Monitor and Replete as Necessary  Leukocytosis -WBC Trend: Recent Labs  Lab 08/07/23 1843 08/08/23 0446 08/09/23 0340   WBC 8.3 4.4 17.3*  -In the setting of steroid Demargination -Continue to Monitor for S/Sx of Infection and continue Azithromycin -Repeat CBC in th eAM   Tobacco Abuse -Smoking Cessation Counseling given -C/w Nicotine Patch 21 mg TD  Metabolic Acidosis -Mild and resolved and CO2 is now 24, anion gap is 8, chloride level 106 -Continue to Monitor and Trend and repeat CMP in the AM   Overweight -Complicates overall prognosis and care -Estimated body mass index is 27.65 kg/m as calculated from the following:   Height as of this encounter: 5\' 3"  (1.6 m).   Weight as of this encounter: 70.8 kg.  -Weight Loss and Dietary Counseling given   DVT prophylaxis: enoxaparin (LOVENOX) injection 40 mg Start: 08/08/23 1000    Code Status: Full Code Family Communication: No family currently at bedside  Disposition Plan:  Level of care: Stepdown Status is: Inpatient Remains inpatient appropriate because: Needs further clinical improvement   Consultants:  None  Procedures:  As delineated as above  Antimicrobials:  Anti-infectives (From admission, onward)    Start     Dose/Rate Route Frequency Ordered Stop   08/08/23 1130  azithromycin (ZITHROMAX) 500 mg in sodium chloride 0.9 % 250 mL IVPB        500 mg 250 mL/hr over 60 Minutes Intravenous Every 24 hours 08/08/23 1036     08/08/23 0106  cefTRIAXone (ROCEPHIN) 1 g in sodium chloride 0.9 % 100 mL IVPB  Status:  Discontinued        1 g 200 mL/hr over 30 Minutes Intravenous Every 24 hours 08/08/23 0106 08/09/23 1446       Subjective: Seen and examined at bedside and she is still feeling short of breath.  States that she is able to cough up a little bit of sputum now.  No nausea or vomiting.  States that she did not feel well and had just come off the BiPAP but wanting to go back.  No other concerns or complaints this time.  Objective: Vitals:   08/09/23 1300 08/09/23 1400 08/09/23 1600 08/09/23 1700  BP: (!) 141/88 (!) 151/81  (!)  143/89  Pulse: (!) 109 (!) 101  96  Resp: (!) 31 18  (!) 25  Temp:   98.2 F (36.8 C)   TempSrc:   Oral   SpO2: 97% 100%  97%  Weight:      Height:        Intake/Output Summary (Last 24 hours) at 08/09/2023 1909 Last data filed at 08/09/2023 1850 Gross per 24 hour  Intake 1338.18 ml  Output 3050 ml  Net -1711.82 ml   Filed Weights   08/07/23 2212  Weight: 70.8 kg   Examination: Physical Exam:  Constitutional: WN/WD overweight AAF in mild respiratory distress Respiratory: Diminished to auscultation bilaterally with some coarse breath sounds and significant wheezing and some rhonchi.  No appreciable rales or crackles.  Has increased respiratory effort and is slightly tachypneic and is on supplemental oxygen via nasal cannula but about to go back on BiPAP Cardiovascular: RRR, no murmurs / rubs / gallops. S1 and S2 auscultated.  Mild 1+ lower extremity edema Abdomen: Soft, non-tender, distended secondary to body habitus. Bowel  sounds positive.  GU: Deferred. Musculoskeletal: No clubbing / cyanosis of digits/nails. No joint deformity upper and lower extremities.  Skin: No rashes, lesions, ulcers limited skin evaluation. No induration; Warm and dry.  Neurologic: CN 2-12 grossly intact with no focal deficits. Romberg sign and cerebellar reflexes not assessed.  Psychiatric: Normal judgment and insight. Alert and oriented x 3.  Anxious appearing  Data Reviewed: I have personally reviewed following labs and imaging studies  CBC: Recent Labs  Lab 08/07/23 1843 08/08/23 0446 08/09/23 0340  WBC 8.3 4.4 17.3*  NEUTROABS 3.8  --  15.5*  HGB 13.4 12.9 13.4  HCT 39.9 39.3 40.4  MCV 92.6 93.6 91.0  PLT 222 211 226   Basic Metabolic Panel: Recent Labs  Lab 08/07/23 1843 08/08/23 0446 08/09/23 0340  NA 138 137 138  K 3.2* 3.7 3.9  CL 104 105 106  CO2 24 21* 24  GLUCOSE 125* 151* 160*  BUN 12 15 16   CREATININE 0.81 0.77 0.63  CALCIUM 9.1 9.4 9.5  MG  --   --  2.3  PHOS  --    --  2.3*   GFR: Estimated Creatinine Clearance: 61.8 mL/min (by C-G formula based on SCr of 0.63 mg/dL). Liver Function Tests: Recent Labs  Lab 08/07/23 1843 08/08/23 0446 08/09/23 0340  AST 20 18 18   ALT 13 14 17   ALKPHOS 64 62 66  BILITOT 0.5 0.3 0.3  PROT 6.6 6.6 7.3  ALBUMIN 3.8 3.6 3.9   No results for input(s): "LIPASE", "AMYLASE" in the last 168 hours. No results for input(s): "AMMONIA" in the last 168 hours. Coagulation Profile: No results for input(s): "INR", "PROTIME" in the last 168 hours. Cardiac Enzymes: No results for input(s): "CKTOTAL", "CKMB", "CKMBINDEX", "TROPONINI" in the last 168 hours. BNP (last 3 results) No results for input(s): "PROBNP" in the last 8760 hours. HbA1C: No results for input(s): "HGBA1C" in the last 72 hours. CBG: No results for input(s): "GLUCAP" in the last 168 hours. Lipid Profile: No results for input(s): "CHOL", "HDL", "LDLCALC", "TRIG", "CHOLHDL", "LDLDIRECT" in the last 72 hours. Thyroid Function Tests: No results for input(s): "TSH", "T4TOTAL", "FREET4", "T3FREE", "THYROIDAB" in the last 72 hours. Anemia Panel: No results for input(s): "VITAMINB12", "FOLATE", "FERRITIN", "TIBC", "IRON", "RETICCTPCT" in the last 72 hours. Sepsis Labs: Recent Labs  Lab 08/09/23 0848 08/09/23 1037 08/09/23 1320 08/09/23 1445  LATICACIDVEN 1.4 4.1* 5.2* 2.0*    Recent Results (from the past 240 hour(s))  Culture, blood (routine x 2)     Status: None (Preliminary result)   Collection Time: 08/07/23  7:02 PM   Specimen: BLOOD  Result Value Ref Range Status   Specimen Description   Final    BLOOD BLOOD LEFT HAND Performed at [facility], [address] [place], [place] [address]    Special Requests   Final    Blood Culture adequate volume BOTTLES DRAWN AEROBIC AND ANAEROBIC Performed at [facility], [address] [place], [place] [address]    Culture   Final    NO GROWTH 2 DAYS Performed at  [facility], [address], [place], [place] [address]    Report Status PENDING  Incomplete  Resp panel by RT-PCR (RSV, Flu A&B, Covid) Anterior Nasal Swab     Status: None   Collection Time: 08/07/23  7:06 PM   Specimen: Anterior Nasal Swab  Result Value Ref Range Status   SARS Coronavirus 2 by RT PCR NEGATIVE NEGATIVE Final  Comment: (NOTE) SARS-CoV-2 target nucleic acids are NOT DETECTED.  The SARS-CoV-2 RNA is generally detectable in upper respiratory specimens during the acute phase of infection. The lowest concentration of SARS-CoV-2 viral copies this assay can detect is 138 copies/mL. A negative result does not preclude SARS-Cov-2 infection and should not be used as the sole basis for treatment or other patient management decisions. A negative result may occur with  improper specimen collection/handling, submission of specimen other than nasopharyngeal swab, presence of viral mutation(s) within the areas targeted by this assay, and inadequate number of viral copies(<138 copies/mL). A negative result must be combined with clinical observations, patient history, and epidemiological information. The expected result is Negative.  Fact Sheet for Patients:  BloggerCourse.com  Fact Sheet for Healthcare Providers:  SeriousBroker.it  This test is no t yet approved or cleared by the [place] FDA and  has been authorized for detection and/or diagnosis of SARS-CoV-2 by FDA under an Emergency Use Authorization (EUA). This EUA will remain  in effect (meaning this test can be used) for the duration of the COVID-19 declaration under Section 564(b)(1) of the Act, 21 U.S.C.section 360bbb-3(b)(1), unless the authorization is terminated  or revoked sooner.       Influenza A by PCR NEGATIVE NEGATIVE Final   Influenza B by PCR NEGATIVE NEGATIVE Final    Comment: (NOTE) The Xpert Xpress SARS-CoV-2/FLU/RSV plus assay is  intended as an aid in the diagnosis of influenza from Nasopharyngeal swab specimens and should not be used as a sole basis for treatment. Nasal washings and aspirates are unacceptable for Xpert Xpress SARS-CoV-2/FLU/RSV testing.  Fact Sheet for Patients: BloggerCourse.com  Fact Sheet for Healthcare Providers: SeriousBroker.it  This test is not yet approved or cleared by the [place] FDA and has been authorized for detection and/or diagnosis of SARS-CoV-2 by FDA under an Emergency Use Authorization (EUA). This EUA will remain in effect (meaning this test can be used) for the duration of the COVID-19 declaration under Section 564(b)(1) of the Act, 21 U.S.C. section 360bbb-3(b)(1), unless the authorization is terminated or revoked.     Resp Syncytial Virus by PCR NEGATIVE NEGATIVE Final    Comment: (NOTE) Fact Sheet for Patients: BloggerCourse.com  Fact Sheet for Healthcare Providers: SeriousBroker.it  This test is not yet approved or cleared by the [place] FDA and has been authorized for detection and/or diagnosis of SARS-CoV-2 by FDA under an Emergency Use Authorization (EUA). This EUA will remain in effect (meaning this test can be used) for the duration of the COVID-19 declaration under Section 564(b)(1) of the Act, 21 U.S.C. section 360bbb-3(b)(1), unless the authorization is terminated or revoked.  Performed at [facility], [address] [place], [place] [address]   Culture, blood (routine x 2)     Status: None (Preliminary result)   Collection Time: 08/07/23  8:30 PM   Specimen: BLOOD  Result Value Ref Range Status   Specimen Description   Final    BLOOD Performed at [facility], [address], [place], [place] [address]    Special Requests   Final    BLOOD RIGHT FOREARM BOTTLES DRAWN AEROBIC AND  ANAEROBIC Performed at [facility], [address] [place], [place] [address]    Culture   Final    NO GROWTH 2 DAYS Performed at [facility], [address], [place], [place] [address]    Report Status PENDING  Incomplete  Respiratory (~20 pathogens) panel by PCR  Status: Abnormal   Collection Time: 08/08/23 10:30 AM   Specimen: Nasopharyngeal Swab; Respiratory  Result Value Ref Range Status   Adenovirus NOT DETECTED NOT DETECTED Final   Coronavirus 229E NOT DETECTED NOT DETECTED Final    Comment: (NOTE) The Coronavirus on the Respiratory Panel, DOES NOT test for the novel  Coronavirus (2019 nCoV)    Coronavirus HKU1 NOT DETECTED NOT DETECTED Final   Coronavirus NL63 NOT DETECTED NOT DETECTED Final   Coronavirus OC43 NOT DETECTED NOT DETECTED Final   Metapneumovirus NOT DETECTED NOT DETECTED Final   Rhinovirus / Enterovirus NOT DETECTED NOT DETECTED Final   Influenza A NOT DETECTED NOT DETECTED Final   Influenza B NOT DETECTED NOT DETECTED Final   Parainfluenza Virus 1 NOT DETECTED NOT DETECTED Final   Parainfluenza Virus 2 NOT DETECTED NOT DETECTED Final   Parainfluenza Virus 3 NOT DETECTED NOT DETECTED Final   Parainfluenza Virus 4 DETECTED (A) NOT DETECTED Final   Respiratory Syncytial Virus NOT DETECTED NOT DETECTED Final   Bordetella pertussis NOT DETECTED NOT DETECTED Final   Bordetella Parapertussis NOT DETECTED NOT DETECTED Final   Chlamydophila pneumoniae NOT DETECTED NOT DETECTED Final   Mycoplasma pneumoniae NOT DETECTED NOT DETECTED Final    Comment: Performed at [facility], [address], [place], [place] [address]  MRSA Next Gen by PCR, Nasal     Status: None   Collection Time: 08/08/23  3:45 PM   Specimen: Nasal Mucosa; Nasal Swab  Result Value Ref Range Status   MRSA by PCR Next Gen NOT DETECTED NOT DETECTED Final    Comment: (NOTE) The GeneXpert MRSA Assay (FDA approved for NASAL specimens only), is one component of a  comprehensive MRSA colonization surveillance program. It is not intended to diagnose MRSA infection nor to guide or monitor treatment for MRSA infections. Test performance is not FDA approved in patients less than 58 years old. Performed at [facility], [address] [place], [place] [address]     Radiology Studies: DG CHEST PORT 1 VIEW  Result Date: 08/08/2023 CLINICAL DATA:  Shortness of breath EXAM: PORTABLE CHEST 1 VIEW COMPARISON:  08/08/2023 FINDINGS: The heart size and mediastinal contours are within normal limits. Both lungs are clear. The visualized skeletal structures are unremarkable. IMPRESSION: No active disease. Electronically Signed   By: Alcide Clever M.D.   On: 08/08/2023 22:25   DG CHEST PORT 1 VIEW  Result Date: 08/08/2023 CLINICAL DATA:  Shortness of breath. EXAM: PORTABLE CHEST 1 VIEW COMPARISON:  August 07, 2023. FINDINGS: The heart size and mediastinal contours are within normal limits. Both lungs are clear. The visualized skeletal structures are unremarkable. IMPRESSION: No active disease. Electronically Signed   By: Lupita Raider M.D.   On: 08/08/2023 15:52   DG Chest Port 1 View  Result Date: 08/07/2023 CLINICAL DATA:  Shortness of breath. Decreased oxygen saturation. Wheezing. EXAM: PORTABLE CHEST 1 VIEW COMPARISON:  10/15/2022 FINDINGS: Examination is somewhat limited due to patient rotation. As visualized, the heart size and pulmonary vascularity appear normal. Peribronchial thickening with some interstitial changes centrally are consistent with airways disease. No focal consolidation or infiltrate. No pleural effusions. No pneumothorax. Degenerative changes in the spine. IMPRESSION: Peribronchial thickening and central interstitial changes or consistent with airways disease. No focal consolidation. Electronically Signed   By: Burman Nieves M.D.   On: 08/07/2023 20:28    Scheduled Meds:  amLODipine  5 mg Oral Daily   arformoterol  15  mcg Nebulization  BID   atorvastatin  10 mg Oral Daily   budesonide (PULMICORT) nebulizer solution  0.25 mg Nebulization BID   Chlorhexidine Gluconate Cloth  6 each Topical Daily   enoxaparin (LOVENOX) injection  40 mg Subcutaneous Q24H   guaiFENesin  1,200 mg Oral BID   ipratropium  0.5 mg Nebulization Q6H   latanoprost  1 drop Both Eyes QHS   levalbuterol  0.63 mg Nebulization Q6H   loratadine  10 mg Oral Daily   methylPREDNISolone (SOLU-MEDROL) injection  125 mg Intravenous Q12H   nicotine  21 mg Transdermal Daily   pantoprazole  40 mg Oral Daily   pregabalin  75 mg Oral TID   QUEtiapine  200 mg Oral QHS   sertraline  100 mg Oral Daily   valbenazine  80 mg Oral Daily   Continuous Infusions:  azithromycin Stopped (08/09/23 1248)   sodium chloride Stopped (08/09/23 1521)    LOS: 2 days   Marguerita Merles, DO [facility] Available via Epic secure chat 7am-7pm After these hours, please refer to coverage provider listed on [email or web address] 08/09/2023, 7:09 PM

## 2023-08-09 NOTE — Progress Notes (Signed)
   08/09/23 0351  BiPAP/CPAP/SIPAP  $ Non-Invasive Ventilator  Non-Invasive Vent Subsequent  BiPAP/CPAP/SIPAP Pt Type Adult  BiPAP/CPAP/SIPAP V60  Mask Type Full face mask  Mask Size Medium  Set Rate 8 breaths/min  Respiratory Rate 21 breaths/min  IPAP 12 cmH20  EPAP 6 cmH2O  FiO2 (%) 28 %  Minute Ventilation 9.6  Leak 0  Peak Inspiratory Pressure (PIP) 12  Tidal Volume (Vt) 469  Auto Titrate No  Press High Alarm 30 cmH2O  Press Low Alarm 5 cmH2O

## 2023-08-09 NOTE — TOC Progression Note (Deleted)
Transition of Care [facility]) - Progression Note    Patient Details  Name: Danielle [name] MRN: [id number] Date of Birth: September 07, 1951  [facility] CM/SW Contact  Darleene Cleaver, [place] Phone Number: 08/09/2023, 5:42 PM  Clinical Narrative:      [facility] acknowledge receipt of consult for [facility] and patient from [facility].  Patient to be seen at a later time to complete assessment.      Expected Discharge Plan and Services                                               Social Determinants of Health (SDOH) Interventions SDOH Screenings   Food Insecurity: No Food Insecurity (08/08/2023)  Housing: Low Risk  (08/08/2023)  Transportation Needs: No Transportation Needs (08/08/2023)  Utilities: Not At Risk (08/08/2023)  Depression (PHQ2-9): Low Risk  (06/18/2019)  Tobacco Use: High Risk (08/08/2023)    Readmission Risk Interventions    10/19/2022   10:48 AM 10/18/2022   12:54 PM 05/14/2022    1:50 PM  Readmission Risk Prevention Plan  Transportation Screening Complete Complete Complete  PCP or Specialist Appt within 5-7 Days   Complete  PCP or Specialist Appt within 3-5 Days Complete Complete   Home Care Screening   Complete  Medication Review (RN CM)   Complete  HRI or Home Care Consult Complete Complete   Social Work Consult for Recovery Care Planning/Counseling Complete Complete   Palliative Care Screening Complete Complete   Medication Review Oceanographer) Complete Complete

## 2023-08-09 NOTE — Plan of Care (Signed)

## 2023-08-09 NOTE — Progress Notes (Signed)
Pt off BIPAP at this time for break.

## 2023-08-09 NOTE — Progress Notes (Signed)
   08/09/23 1956  BiPAP/CPAP/SIPAP  BiPAP/CPAP/SIPAP Pt Type Adult  BiPAP/CPAP/SIPAP V60  Mask Type Full face mask  Mask Size Medium  Set Rate 8 breaths/min  Respiratory Rate 28 breaths/min  IPAP 12 cmH20  EPAP 6 cmH2O  FiO2 (%) 28 %  Minute Ventilation 15.6  Leak 12  Peak Inspiratory Pressure (PIP) 12  Tidal Volume (Vt) 556  Patient Home Equipment No  Auto Titrate No  Press High Alarm 30 cmH2O  Press Low Alarm 5 cmH2O  CPAP/SIPAP surface wiped down Yes  BiPAP/CPAP /SiPAP Vitals  SpO2 97 %  Bilateral Breath Sounds Diminished;Expiratory wheezes   Pt. Currently tolerating BiPAP V60 well.

## 2023-08-09 NOTE — TOC Initial Note (Signed)
Transition of Care [facility]) - Initial/Assessment Note    Patient Details  Name: Danielle [name] MRN: [id number] Date of Birth: Apr 17, 1951  [facility]) CM/SW Contact:    Darleene Cleaver, LCSW Phone Number: 08/09/2023, 5:53 PM  Clinical Narrative:                  Patient is a 72 year old female who is alert and oriented x4.  Patient is from [facility] and plans to return with home health once medically ready for discharge.  Patient was lethargic yesterday, and on BIPAP today, assessment completed by reviewing patient's chart.  Patient has history of substance abuse, and does have a daughter.  Patient was seen by PT and they are recommending that she return back to the ALF with [facility] services.  Patient unable to discuss home health services at this time.  TOC to follow up with her regarding HH at a later time.  TOC to also contact [facility] once patient is closer to being medically ready for discharge to see if they need to evaluate patient.  TOC to continue to follow patient's progress and facilitate discharge planning.  Expected Discharge Plan: Assisted Living Barriers to Discharge: Continued Medical Work up   Patient Goals and CMS Choice Patient states their goals for this hospitalization and ongoing recovery are:: To return back to [facility]. CMS Medicare.gov Compare Post Acute Care list provided to:: Patient Choice offered to / list presented to : Patient [place] ownership interest in [facility].provided to:: Patient    Expected Discharge Plan and Services       Living arrangements for the past 2 months: Assisted Living Facility                                      Prior Living Arrangements/Services Living arrangements for the past 2 months: Assisted Living Facility Lives with:: Facility Resident Patient language and need for interpreter reviewed:: Yes Do you feel safe going back to the place where you live?: Yes       Need for Family Participation in Patient Care: Yes (Comment) Care giver support system in place?: Yes (comment)   Criminal Activity/Legal Involvement Pertinent to Current Situation/Hospitalization: No - Comment as needed  Activities of Daily Living   ADL Screening (condition at time of admission) Independently performs ADLs?: No Does the patient have a NEW difficulty with bathing/dressing/toileting/self-feeding that is expected to last >3 days?: No Does the patient have a NEW difficulty with getting in/out of bed, walking, or climbing stairs that is expected to last >3 days?: No Does the patient have a NEW difficulty with communication that is expected to last >3 days?: No Is the patient deaf or have difficulty hearing?: No Does the patient have difficulty seeing, even when wearing glasses/contacts?: No Does the patient have difficulty concentrating, remembering, or making decisions?: No  Permission Sought/Granted Permission sought to share information with : Case Manager, Magazine features editor, Family Supports Permission granted to share information with : Yes, Release of Information Signed, Yes, Verbal Permission Granted  Share Information with NAME: CAMBRIDGE,VERONICA Daughter   [phone]  Permission granted to share info w AGENCY: [facility]        Emotional Assessment Appearance:: Appears stated age   Affect (typically observed): Accepting, Calm Orientation: : Oriented to Self, Oriented to Place, Oriented to  Time, Oriented to  Situation Alcohol / Substance Use: Not Applicable Psych Involvement: Yes (comment), Outpatient Provider  Admission diagnosis:  Acute hypoxemic respiratory failure (HCC) [J96.01] Dyspnea, unspecified type [R06.00] Patient Active Problem List   Diagnosis Date Noted   Acute hypoxemic respiratory failure (HCC) 08/07/2023   COPD exacerbation (HCC) 10/16/2022   COPD with acute exacerbation (HCC) 10/15/2022   Acute respiratory failure  with hypoxia (HCC) 10/15/2022   GI bleeding 05/11/2022   Acute blood loss anemia 05/11/2022   Major depressive disorder, recurrent episode, moderate (HCC)    Dysarthria 12/29/2020   AKI (acute kidney injury) (HCC) 12/29/2020   CAD (coronary artery disease) 03/22/2019   Unstable angina (HCC)    Hypokalemia 08/11/2018   Tobacco abuse 08/11/2018   Memory disturbance 01/30/2016   Schizophrenia, paranoid type (HCC) 04/28/2012   Cocaine abuse (HCC) 04/28/2012   Cannabis abuse 04/28/2012   Substance induced mood disorder (HCC) 04/28/2012   Noncompliance 09/16/2011   Anxiety 09/16/2011   Constipation 09/16/2011   Polysubstance abuse (HCC) 12/03/2008   Hyperlipidemia 08/01/2008   MIGRAINE HEADACHE 08/01/2008   ASTHMA 08/01/2008   DEPRESSION 06/20/2008   ANXIETY DISORDER, GENERALIZED 05/28/2008   HYPERTENSION, BENIGN ESSENTIAL 05/28/2008   GERD 05/28/2008   DEEP VENOUS THROMBOPHLEBITIS, LEFT, LEG, HX OF 10/18/1988   PCP:  [facility]:   [facility] [place], [place] - [address] [place] [address] Phone: [phone] Fax: [phone]     Social Determinants of Health (SDOH) Social History: SDOH Screenings   Food Insecurity: No Food Insecurity (08/08/2023)  Housing: Low Risk  (08/08/2023)  Transportation Needs: No Transportation Needs (08/08/2023)  Utilities: Not At Risk (08/08/2023)  Depression (PHQ2-9): Low Risk  (06/18/2019)  Tobacco Use: High Risk (08/08/2023)   SDOH Interventions:     Readmission Risk Interventions    10/19/2022   10:48 AM 10/18/2022   12:54 PM 05/14/2022    1:50 PM  Readmission Risk Prevention Plan  Transportation Screening Complete Complete Complete  PCP or Specialist Appt within 5-7 Days   Complete  PCP or Specialist Appt within 3-5 Days Complete Complete   Home Care Screening   Complete  Medication Review (RN CM)   Complete  HRI or Home Care Consult Complete Complete    Social Work Consult for Recovery Care Planning/Counseling Complete Complete   Palliative Care Screening Complete Complete   Medication Review Oceanographer) Complete Complete

## 2023-08-09 NOTE — Progress Notes (Signed)
Pt on BIPAP due to WOB. Pt doing well at this time.

## 2023-08-09 NOTE — Progress Notes (Signed)
Pt placed back on bipap at her request.

## 2023-08-10 ENCOUNTER — Inpatient Hospital Stay (HOSPITAL_COMMUNITY): Payer: 59

## 2023-08-10 DIAGNOSIS — R0600 Dyspnea, unspecified: Secondary | ICD-10-CM | POA: Diagnosis not present

## 2023-08-10 DIAGNOSIS — I10 Essential (primary) hypertension: Secondary | ICD-10-CM | POA: Diagnosis not present

## 2023-08-10 DIAGNOSIS — E876 Hypokalemia: Secondary | ICD-10-CM | POA: Diagnosis not present

## 2023-08-10 LAB — CBC WITH DIFFERENTIAL/PLATELET
Abs Immature Granulocytes: 0.12 10*3/uL — ABNORMAL HIGH (ref 0.00–0.07)
Basophils Absolute: 0 10*3/uL (ref 0.0–0.1)
Basophils Relative: 0 %
Eosinophils Absolute: 0 10*3/uL (ref 0.0–0.5)
Eosinophils Relative: 0 %
HCT: 41.3 % (ref 36.0–46.0)
Hemoglobin: 13.4 g/dL (ref 12.0–15.0)
Immature Granulocytes: 1 %
Lymphocytes Relative: 6 %
Lymphs Abs: 1 10*3/uL (ref 0.7–4.0)
MCH: 30.1 pg (ref 26.0–34.0)
MCHC: 32.4 g/dL (ref 30.0–36.0)
MCV: 92.8 fL (ref 80.0–100.0)
Monocytes Absolute: 0.4 10*3/uL (ref 0.1–1.0)
Monocytes Relative: 3 %
Neutro Abs: 15 10*3/uL — ABNORMAL HIGH (ref 1.7–7.7)
Neutrophils Relative %: 90 %
Platelets: 239 10*3/uL (ref 150–400)
RBC: 4.45 MIL/uL (ref 3.87–5.11)
RDW: 14.8 % (ref 11.5–15.5)
WBC: 16.6 10*3/uL — ABNORMAL HIGH (ref 4.0–10.5)
nRBC: 0 % (ref 0.0–0.2)

## 2023-08-10 LAB — COMPREHENSIVE METABOLIC PANEL
ALT: 13 U/L (ref 0–44)
AST: 19 U/L (ref 15–41)
Albumin: 3.5 g/dL (ref 3.5–5.0)
Alkaline Phosphatase: 58 U/L (ref 38–126)
Anion gap: 8 (ref 5–15)
BUN: 17 mg/dL (ref 8–23)
CO2: 24 mmol/L (ref 22–32)
Calcium: 9.2 mg/dL (ref 8.9–10.3)
Chloride: 106 mmol/L (ref 98–111)
Creatinine, Ser: 0.56 mg/dL (ref 0.44–1.00)
GFR, Estimated: >60 mL/min (ref >60)
Glucose, Bld: 145 mg/dL — ABNORMAL HIGH (ref 70–99)
Potassium: 4 mmol/L (ref 3.5–5.1)
Sodium: 138 mmol/L (ref 135–145)
Total Bilirubin: 0.5 mg/dL (ref 0.3–1.2)
Total Protein: 6.8 g/dL (ref 6.5–8.1)

## 2023-08-10 LAB — MAGNESIUM: Magnesium: 2.3 mg/dL (ref 1.7–2.4)

## 2023-08-10 LAB — PHOSPHORUS: Phosphorus: 2.1 mg/dL — ABNORMAL LOW (ref 2.5–4.6)

## 2023-08-10 MED ORDER — METOPROLOL TARTRATE 25 MG PO TABS
25.0000 mg | ORAL_TABLET | Freq: Two times a day (BID) | ORAL | Status: DC
  Administered 2023-08-10 – 2023-08-15 (×10): 25 mg via ORAL
  Filled 2023-08-10 (×10): qty 1

## 2023-08-10 MED ORDER — K PHOS MONO-SOD PHOS DI & MONO 155-852-130 MG PO TABS
500.0000 mg | ORAL_TABLET | Freq: Two times a day (BID) | ORAL | Status: AC
  Administered 2023-08-10 (×2): 500 mg via ORAL
  Filled 2023-08-10 (×2): qty 2

## 2023-08-10 MED ORDER — ALBUTEROL SULFATE (2.5 MG/3ML) 0.083% IN NEBU: 3.0000 mL | INHALATION_SOLUTION | RESPIRATORY_TRACT | Status: DC | PRN

## 2023-08-10 MED ORDER — PREDNISONE 50 MG PO TABS
50.0000 mg | ORAL_TABLET | Freq: Every day | ORAL | Status: DC
  Administered 2023-08-10 – 2023-08-14 (×5): 50 mg via ORAL
  Filled 2023-08-10 (×5): qty 1

## 2023-08-10 NOTE — Progress Notes (Signed)
   08/09/23 2340  BiPAP/CPAP/SIPAP  BiPAP/CPAP/SIPAP Pt Type Adult  BiPAP/CPAP/SIPAP V60  BiPAP/CPAP /SiPAP Vitals  SpO2 98 %   Pt. found off BiPAP V60 and on 3 lpm n/c, RN aware, was given Meds and tolerating n/c very well, RT to follow.

## 2023-08-10 NOTE — Plan of Care (Signed)
  Problem: Health Behavior/Discharge Planning: Goal: Ability to manage health-related needs will improve Outcome: Progressing   Problem: Nutrition: Goal: Adequate nutrition will be maintained Outcome: Progressing   Problem: Elimination: Goal: Will not experience complications related to bowel motility Outcome: Progressing   Problem: Pain Managment: Goal: General experience of comfort will improve Outcome: Progressing   Problem: Skin Integrity: Goal: Risk for impaired skin integrity will decrease Outcome: Adequate for Discharge

## 2023-08-10 NOTE — Progress Notes (Signed)
   08/10/23 0202  BiPAP/CPAP/SIPAP  Reason BIPAP/CPAP not in use Other(comment)  BiPAP/CPAP /SiPAP Vitals  Bilateral Breath Sounds Diminished;Expiratory wheezes   Pt. Sleeping in no apparent distress, awakes easily, n/c currently placed at 3 lpm with 100% Oxygen saturation, placed set flow to 2 lpm, RN made aware.

## 2023-08-10 NOTE — Progress Notes (Signed)
PT Cancellation Note  Patient Details Name: Danielle [name] MRN: [id number] DOB: [birth year]   Cancelled Treatment:    Reason Eval/Treat Not Completed: Fatigue/lethargy limiting ability to participate, patient just returned to bed  has been OOB and BSc. Will check back tomorrow. Blanchard Kelch PT Acute Rehabilitation Services Office [phone] Weekend [phone]    Rada Hay 08/10/2023, 3:36 PM

## 2023-08-10 NOTE — Progress Notes (Signed)
[facility] PROGRESS NOTE    Progress Note  Danielle [name]  WUJ:[id number] DOB: Sep 21, 1951 DOA: 08/07/2023 PCP: [facility]     Brief Narrative:   Danielle [name] is an 72 y.o. female past medical history significant for bipolar disorder, coronary artery disease, essential hypertension history of CVA who resides at a nursing home facility comes in with shortness of breath and cough likely due to COPD exacerbation in the setting of parainfluenza    Assessment/Plan:   Acute respiratory failure with hypoxia in the setting of acute COPD exacerbation due to parainfluenza virus: Continue IV azithromycin inhalers and steroids.  Steroids had to be increased yesterday due to increased work of breathing and worsening physical exam. Chest x-ray showed no acute findings. This morning she is requiring 2 L of oxygen to keep saturations greater than 95%. Will transfer to MedSurg.  Tobacco abuse: Continue nicotine 21 mg.  Hyperlipidemia: Continue statins.  History of polysubstance abuse: Noted.  History of schizophrenia: Continue sertraline and Seroquel they will be seen on hydroxyzine.  Essential hypertension: Continue amlodipine.  Coronary artery disease: Noted.  Lactic acidosis: Likely due to hypoxia now resolved.  Hypophosphatemia: Replete orally try to keep greater than 3.  Leukocytosis: Likely due to steroids.  Metabolic acidosis:  Likely due to lactic acidosis now resolved.  Overweight: Counseling.    DVT prophylaxis: lovenox Family Communication:none Status is: Inpatient Remains inpatient appropriate because: Acute respiratory failure with hypoxia due to COPD exacerbation.    Code Status:     Code Status Orders  (From admission, onward)           Start     Ordered   08/07/23 2036  Full code  Continuous       Question:  By:  Answer:  Consent: discussion documented in EHR   08/07/23 2038           Code Status History      Date Active Date Inactive Code Status Order ID Comments User Context   10/15/2022 1748 10/19/2022 1810 Full Code [id number]  Osvaldo Shipper, MD ED   05/11/2022 1322 05/14/2022 1913 Full Code [id number]  Carollee Herter, DO Inpatient   12/30/2020 1318 12/31/2020 2146 Full Code [id number]  Chotiner, Claudean Severance, MD Inpatient   03/21/2019 1653 03/23/2019 2010 Full Code [id number]  Nyra Market, MD ED   08/10/2018 1658 08/14/2018 2139 Full Code [id number]  Zigmund Daniel., MD ED   05/23/2014 2209 05/27/2014 1905 Full Code [id number]  Ky Barban, MD Inpatient   12/09/2013 0054 12/12/2013 1533 Full Code [id number]  Hillary Bow, DO ED   08/25/2012 1958 08/26/2012 0413 Full Code [id number]  Hilario Quarry, MD ED   04/27/2012 1322 04/28/2012 0002 Full Code [id number]  Lottie Mussel, PA ED   01/21/2012 0754 01/22/2012 0208 Full Code [id number]  Lear Ng., MD ED   09/14/2011 2327 09/16/2011 1716 Full Code [id number]  Chesley Noon, RN Inpatient         IV Access:   Peripheral IV   Procedures and diagnostic studies:   DG CHEST PORT 1 VIEW  Result Date: 08/08/2023 CLINICAL DATA:  Shortness of breath EXAM: PORTABLE CHEST 1 VIEW COMPARISON:  08/08/2023 FINDINGS: The heart size and mediastinal contours are within normal limits. Both lungs are clear. The visualized skeletal structures are unremarkable. IMPRESSION: No active disease. Electronically Signed   By: Alcide Clever M.D.   On: 08/08/2023 22:25   DG CHEST PORT 1 VIEW  Result Date: 08/08/2023  CLINICAL DATA:  Shortness of breath. EXAM: PORTABLE CHEST 1 VIEW COMPARISON:  August 07, 2023. FINDINGS: The heart size and mediastinal contours are within normal limits. Both lungs are clear. The visualized skeletal structures are unremarkable. IMPRESSION: No active disease. Electronically Signed   By: Lupita Raider M.D.   On: 08/08/2023 15:52     Medical Consultants:   None.   Subjective:    Danielle [name] she is her breathing is  about the same.  Objective:    Vitals:   08/10/23 0014 08/10/23 0444 08/10/23 0500 08/10/23 0600  BP:   (!) 154/91 (!) 151/92  Pulse:   91 95  Resp:   18 14  Temp: 98.2 F (36.8 C) 98.2 F (36.8 C)    TempSrc: Oral Oral    SpO2:   98% 98%  Weight:      Height:       SpO2: 98 % O2 Flow Rate (L/min): (S) 2 L/min FiO2 (%): 28 %   Intake/Output Summary (Last 24 hours) at 08/10/2023 0733 Last data filed at 08/10/2023 0500 Gross per 24 hour  Intake 803.44 ml  Output 2450 ml  Net -1646.56 ml   Filed Weights   08/07/23 2212  Weight: 70.8 kg    Exam: General exam: In no acute distress. Respiratory system: Good air movement and rhonchi bilaterally more upper airway than lower airway sounds. Cardiovascular system: S1 & S2 heard, RRR. No JVD. Gastrointestinal system: Abdomen is nondistended, soft and nontender.  Extremities: No pedal edema. Skin: No rashes, lesions or ulcers Psychiatry: Judgement and insight appear normal. Mood & affect appropriate.    Data Reviewed:    Labs: Basic Metabolic Panel: Recent Labs  Lab 08/07/23 1843 08/08/23 0446 08/09/23 0340 08/10/23 0316  NA 138 137 138 138  K 3.2* 3.7 3.9 4.0  CL 104 105 106 106  CO2 24 21* 24 24  GLUCOSE 125* 151* 160* 145*  BUN 12 15 16 17   CREATININE 0.81 0.77 0.63 0.56  CALCIUM 9.1 9.4 9.5 9.2  MG  --   --  2.3 2.3  PHOS  --   --  2.3* 2.1*   GFR Estimated Creatinine Clearance: 61.8 mL/min (by C-G formula based on SCr of 0.56 mg/dL). Liver Function Tests: Recent Labs  Lab 08/07/23 1843 08/08/23 0446 08/09/23 0340 08/10/23 0316  AST 20 18 18 19   ALT 13 14 17 13   ALKPHOS 64 62 66 58  BILITOT 0.5 0.3 0.3 0.5  PROT 6.6 6.6 7.3 6.8  ALBUMIN 3.8 3.6 3.9 3.5   No results for input(s): "LIPASE", "AMYLASE" in the last 168 hours. No results for input(s): "AMMONIA" in the last 168 hours. Coagulation profile No results for input(s): "INR", "PROTIME" in the last 168 hours. COVID-19 Labs  No results  for input(s): "DDIMER", "FERRITIN", "LDH", "CRP" in the last 72 hours.  Lab Results  Component Value Date   SARSCOV2NAA NEGATIVE 08/07/2023   SARSCOV2NAA NEGATIVE 10/15/2022   SARSCOV2NAA NEGATIVE 12/31/2020   SARSCOV2NAA NEGATIVE 12/29/2020    CBC: Recent Labs  Lab 08/07/23 1843 08/08/23 0446 08/09/23 0340 08/10/23 0316  WBC 8.3 4.4 17.3* 16.6*  NEUTROABS 3.8  --  15.5* 15.0*  HGB 13.4 12.9 13.4 13.4  HCT 39.9 39.3 40.4 41.3  MCV 92.6 93.6 91.0 92.8  PLT 222 211 226 239   Cardiac Enzymes: No results for input(s): "CKTOTAL", "CKMB", "CKMBINDEX", "TROPONINI" in the last 168 hours. BNP (last 3 results) No results for input(s): "PROBNP" in the last  8760 hours. CBG: No results for input(s): "GLUCAP" in the last 168 hours. D-Dimer: No results for input(s): "DDIMER" in the last 72 hours. Hgb A1c: No results for input(s): "HGBA1C" in the last 72 hours. Lipid Profile: No results for input(s): "CHOL", "HDL", "LDLCALC", "TRIG", "CHOLHDL", "LDLDIRECT" in the last 72 hours. Thyroid function studies: No results for input(s): "TSH", "T4TOTAL", "T3FREE", "THYROIDAB" in the last 72 hours.  Invalid input(s): "FREET3" Anemia work up: No results for input(s): "VITAMINB12", "FOLATE", "FERRITIN", "TIBC", "IRON", "RETICCTPCT" in the last 72 hours. Sepsis Labs: Recent Labs  Lab 08/07/23 1843 08/07/23 1859 08/08/23 0446 08/08/23 0928 08/09/23 0340 08/09/23 0848 08/09/23 1037 08/09/23 1320 08/09/23 1445 08/10/23 0316  WBC 8.3  --  4.4  --  17.3*  --   --   --   --  16.6*  LATICACIDVEN  --    < >  --    < >  --  1.4 4.1* 5.2* 2.0*  --    < > = values in this interval not displayed.   Microbiology Recent Results (from the past 240 hour(s))  Culture, blood (routine x 2)     Status: None (Preliminary result)   Collection Time: 08/07/23  7:02 PM   Specimen: BLOOD  Result Value Ref Range Status   Specimen Description   Final    BLOOD BLOOD LEFT HAND Performed at [facility], [address], [place], [place] [address]    Special Requests   Final    Blood Culture adequate volume BOTTLES DRAWN AEROBIC AND ANAEROBIC Performed at [facility], [address], [place], [place] [address]    Culture   Final    NO GROWTH 3 DAYS Performed at [facility], [address], [place], [place] [address]    Report Status PENDING  Incomplete  Resp panel by RT-PCR (RSV, Flu A&B, Covid) Anterior Nasal Swab     Status: None   Collection Time: 08/07/23  7:06 PM   Specimen: Anterior Nasal Swab  Result Value Ref Range Status   SARS Coronavirus 2 by RT PCR NEGATIVE NEGATIVE Final    Comment: (NOTE) SARS-CoV-2 target nucleic acids are NOT DETECTED.  The SARS-CoV-2 RNA is generally detectable in upper respiratory specimens during the acute phase of infection. The lowest concentration of SARS-CoV-2 viral copies this assay can detect is 138 copies/mL. A negative result does not preclude SARS-Cov-2 infection and should not be used as the sole basis for treatment or other patient management decisions. A negative result may occur with  improper specimen collection/handling, submission of specimen other than nasopharyngeal swab, presence of viral mutation(s) within the areas targeted by this assay, and inadequate number of viral copies(<138 copies/mL). A negative result must be combined with clinical observations, patient history, and epidemiological information. The expected result is Negative.  Fact Sheet for Patients:  BloggerCourse.com  Fact Sheet for Healthcare Providers:  SeriousBroker.it  This test is no t yet approved or cleared by the [place] FDA and  has been authorized for detection and/or diagnosis of SARS-CoV-2 by FDA under an Emergency Use Authorization (EUA). This EUA will remain  in effect (meaning this test can be used) for the duration of the COVID-19  declaration under Section 564(b)(1) of the Act, 21 U.S.C.section 360bbb-3(b)(1), unless the authorization is terminated  or revoked sooner.       Influenza A by PCR NEGATIVE NEGATIVE Final   Influenza B by PCR NEGATIVE NEGATIVE Final    Comment: (  NOTE) The Xpert Xpress SARS-CoV-2/FLU/RSV plus assay is intended as an aid in the diagnosis of influenza from Nasopharyngeal swab specimens and should not be used as a sole basis for treatment. Nasal washings and aspirates are unacceptable for Xpert Xpress SARS-CoV-2/FLU/RSV testing.  Fact Sheet for Patients: BloggerCourse.com  Fact Sheet for Healthcare Providers: SeriousBroker.it  This test is not yet approved or cleared by the [place] FDA and has been authorized for detection and/or diagnosis of SARS-CoV-2 by FDA under an Emergency Use Authorization (EUA). This EUA will remain in effect (meaning this test can be used) for the duration of the COVID-19 declaration under Section 564(b)(1) of the Act, 21 U.S.C. section 360bbb-3(b)(1), unless the authorization is terminated or revoked.     Resp Syncytial Virus by PCR NEGATIVE NEGATIVE Final    Comment: (NOTE) Fact Sheet for Patients: BloggerCourse.com  Fact Sheet for Healthcare Providers: SeriousBroker.it  This test is not yet approved or cleared by the [place] FDA and has been authorized for detection and/or diagnosis of SARS-CoV-2 by FDA under an Emergency Use Authorization (EUA). This EUA will remain in effect (meaning this test can be used) for the duration of the COVID-19 declaration under Section 564(b)(1) of the Act, 21 U.S.C. section 360bbb-3(b)(1), unless the authorization is terminated or revoked.  Performed at [facility], [address], [place], [place] [address]   Culture, blood (routine x 2)     Status: None (Preliminary result)    Collection Time: 08/07/23  8:30 PM   Specimen: BLOOD  Result Value Ref Range Status   Specimen Description   Final    BLOOD Performed at [facility], [address] [place], [place] [address]    Special Requests   Final    BLOOD RIGHT FOREARM BOTTLES DRAWN AEROBIC AND ANAEROBIC Performed at [facility], [address], [place], [place] [address]    Culture   Final    NO GROWTH 3 DAYS Performed at [facility], [address] [place], [place] [address]    Report Status PENDING  Incomplete  Respiratory (~20 pathogens) panel by PCR     Status: Abnormal   Collection Time: 08/08/23 10:30 AM   Specimen: Nasopharyngeal Swab; Respiratory  Result Value Ref Range Status   Adenovirus NOT DETECTED NOT DETECTED Final   Coronavirus 229E NOT DETECTED NOT DETECTED Final    Comment: (NOTE) The Coronavirus on the Respiratory Panel, DOES NOT test for the novel  Coronavirus (2019 nCoV)    Coronavirus HKU1 NOT DETECTED NOT DETECTED Final   Coronavirus NL63 NOT DETECTED NOT DETECTED Final   Coronavirus OC43 NOT DETECTED NOT DETECTED Final   Metapneumovirus NOT DETECTED NOT DETECTED Final   Rhinovirus / Enterovirus NOT DETECTED NOT DETECTED Final   Influenza A NOT DETECTED NOT DETECTED Final   Influenza B NOT DETECTED NOT DETECTED Final   Parainfluenza Virus 1 NOT DETECTED NOT DETECTED Final   Parainfluenza Virus 2 NOT DETECTED NOT DETECTED Final   Parainfluenza Virus 3 NOT DETECTED NOT DETECTED Final   Parainfluenza Virus 4 DETECTED (A) NOT DETECTED Final   Respiratory Syncytial Virus NOT DETECTED NOT DETECTED Final   Bordetella pertussis NOT DETECTED NOT DETECTED Final   Bordetella Parapertussis NOT DETECTED NOT DETECTED Final   Chlamydophila pneumoniae NOT DETECTED NOT DETECTED Final   Mycoplasma pneumoniae NOT DETECTED NOT DETECTED Final    Comment: Performed at [facility], [address], [place], [place] [address]  MRSA Next  Gen by  PCR, Nasal     Status: None   Collection Time: 08/08/23  3:45 PM   Specimen: Nasal Mucosa; Nasal Swab  Result Value Ref Range Status   MRSA by PCR Next Gen NOT DETECTED NOT DETECTED Final    Comment: (NOTE) The GeneXpert MRSA Assay (FDA approved for NASAL specimens only), is one component of a comprehensive MRSA colonization surveillance program. It is not intended to diagnose MRSA infection nor to guide or monitor treatment for MRSA infections. Test performance is not FDA approved in patients less than 2 years old. Performed at [facility], [address] [place], [place] [address]      Medications:    amLODipine  5 mg Oral Daily   arformoterol  15 mcg Nebulization BID   atorvastatin  10 mg Oral Daily   budesonide (PULMICORT) nebulizer solution  0.25 mg Nebulization BID   Chlorhexidine Gluconate Cloth  6 each Topical Daily   enoxaparin (LOVENOX) injection  40 mg Subcutaneous Q24H   guaiFENesin  1,200 mg Oral BID   ipratropium  0.5 mg Nebulization Q6H   latanoprost  1 drop Both Eyes QHS   levalbuterol  0.63 mg Nebulization Q6H   loratadine  10 mg Oral Daily   methylPREDNISolone (SOLU-MEDROL) injection  125 mg Intravenous Q12H   nicotine  21 mg Transdermal Daily   pantoprazole  40 mg Oral Daily   pregabalin  75 mg Oral TID   QUEtiapine  200 mg Oral QHS   sertraline  100 mg Oral Daily   valbenazine  80 mg Oral Daily   Continuous Infusions:  azithromycin Stopped (08/09/23 1248)   sodium chloride Stopped (08/09/23 1521)      LOS: 3 days   Marinda Elk  [facility]  08/10/2023, 7:33 AM

## 2023-08-11 DIAGNOSIS — R0600 Dyspnea, unspecified: Secondary | ICD-10-CM | POA: Diagnosis not present

## 2023-08-11 DIAGNOSIS — E876 Hypokalemia: Secondary | ICD-10-CM | POA: Diagnosis not present

## 2023-08-11 DIAGNOSIS — I10 Essential (primary) hypertension: Secondary | ICD-10-CM | POA: Diagnosis not present

## 2023-08-11 LAB — PHOSPHORUS: Phosphorus: 3 mg/dL (ref 2.5–4.6)

## 2023-08-11 MED ORDER — ORAL CARE MOUTH RINSE: 15.0000 mL | OROMUCOSAL | Status: DC | PRN

## 2023-08-11 MED ORDER — AZITHROMYCIN 250 MG PO TABS
500.0000 mg | ORAL_TABLET | Freq: Once | ORAL | Status: AC
  Administered 2023-08-12: 500 mg via ORAL
  Filled 2023-08-11: qty 2

## 2023-08-11 NOTE — TOC Progression Note (Signed)
Transition of Care [facility]) - Progression Note    Patient Details  Name: [name] MRN: [id number] Date of Birth: 07/16/1951  Transition of Care [facility]) CM/SW Contact  Coralyn Helling, [place] Phone Number: 08/11/2023, 12:06 PM  Clinical Narrative:   Patient can return to facility when ready. Will need FL2 with dc meds.     Expected Discharge Plan: Assisted Living Barriers to Discharge: Continued Medical Work up  Expected Discharge Plan and Services       Living arrangements for the past 2 months: Assisted Living Facility                                       Social Determinants of Health (SDOH) Interventions SDOH Screenings   Food Insecurity: No Food Insecurity (08/08/2023)  Housing: Low Risk  (08/08/2023)  Transportation Needs: No Transportation Needs (08/08/2023)  Utilities: Not At Risk (08/08/2023)  Depression (PHQ2-9): Low Risk  (06/18/2019)  Tobacco Use: High Risk (08/08/2023)    Readmission Risk Interventions    10/19/2022   10:48 AM 10/18/2022   12:54 PM 05/14/2022    1:50 PM  Readmission Risk Prevention Plan  Transportation Screening Complete Complete Complete  PCP or Specialist Appt within 5-7 Days   Complete  PCP or Specialist Appt within 3-5 Days Complete Complete   Home Care Screening   Complete  Medication Review (RN CM)   Complete  HRI or Home Care Consult Complete Complete   Social Work Consult for Recovery Care Planning/Counseling Complete Complete   Palliative Care Screening Complete Complete   Medication Review Oceanographer) Complete Complete

## 2023-08-11 NOTE — Progress Notes (Signed)
Physical Therapy Treatment Patient Details Name: Danielle [name] MRN: [id number] DOB: 12-02-1950 Today's Date: 08/11/2023   History of Present Illness Danielle [name] is a 72 y.o. female presents with SOB and cough. Pt admitted with acute on chronic hypoxic respiratory failure. PMH: COPD, schizophrenia, HTN, CAD, bipolar, stroke, tardive dyskinesia    PT Comments   The Patient received in recliner, appears uncomfortable and reports back pain, assisted to sitting upright, assisted  with back stretches seated upright in  recliner. Performed LE seated exercises. Patient with audible  wheezes, Spo2 on 2 L > 92%. Patient reports that she was independent  with transfers and used a WC at the ALF. Recommned HHPT at ALF.    If plan is discharge home, recommend the following: Assistance with cooking/housework;A little help with walking and/or transfers;A little help with bathing/dressing/bathroom;Direct supervision/assist for medications management;Assist for transportation   Can travel by private vehicle        Equipment Recommendations  None recommended by PT    Recommendations for Other Services       Precautions / Restrictions Precautions Precautions: Fall Precaution Comments: monitor O2 and HR Restrictions Weight Bearing Restrictions: No     Mobility  Bed Mobility               General bed mobility comments: in recliner,    Transfers Overall transfer level: Needs assistance Equipment used: Rolling walker (2 wheels) Transfers: Sit to/from Stand Sit to Stand: Min assist           General transfer comment: Patient stood x 1 from recliner,  patient reporting back pain, Unable to progress as IV in wrist occluded and m,eds just hung.    Ambulation/Gait                   Stairs             Wheelchair Mobility     Tilt Bed    Modified Rankin (Stroke Patients Only)       Balance Overall balance assessment: Needs assistance Sitting-balance  support: No upper extremity supported, Feet supported Sitting balance-Leahy Scale: Fair     Standing balance support: Bilateral upper extremity supported, During functional activity, Reliant on assistive device for balance Standing balance-Leahy Scale: Fair Standing balance comment: stands with UE support                            Cognition Arousal: Alert Behavior During Therapy: WFL for tasks assessed/performed, Anxious Overall Cognitive Status: Within Functional Limits for tasks assessed                                          Exercises General Exercises - Lower Extremity Ankle Circles/Pumps: AROM, Both, 10 reps Long Arc Quad: AROM, 5 reps, Both, Seated    General Comments        Pertinent Vitals/Pain Pain Assessment Pain Score: 7  Pain Location: back Pain Descriptors / Indicators: Discomfort Pain Intervention(s): Monitored during session, Patient requesting pain meds-RN notified, RN gave pain meds during session, Heat applied    Home Living                          Prior Function            PT Goals (current goals can now be found in  the care plan section) Progress towards PT goals: Progressing toward goals    Frequency    Min 1X/week      PT Plan      Co-evaluation              AM-PAC PT "6 Clicks" Mobility   Outcome Measure  Help needed turning from your back to your side while in a flat bed without using bedrails?: A Little Help needed moving from lying on your back to sitting on the side of a flat bed without using bedrails?: A Little Help needed moving to and from a bed to a chair (including a wheelchair)?: A Little Help needed standing up from a chair using your arms (e.g., wheelchair or bedside chair)?: A Little Help needed to walk in hospital room?: Total Help needed climbing 3-5 steps with a railing? : Total 6 Click Score: 14    End of Session Equipment Utilized During Treatment:  Oxygen Activity Tolerance: Patient tolerated treatment well Patient left: with call bell/phone within reach;in chair Nurse Communication: Mobility status;Patient requests pain meds PT Visit Diagnosis: Other abnormalities of gait and mobility (R26.89);Muscle weakness (generalized) (M62.81)     Time: 1130-1200 PT Time Calculation (min) (ACUTE ONLY): 30 min  Charges:    $Therapeutic Activity: 23-37 mins PT General Charges $$ ACUTE PT VISIT: 1 Visit                     Blanchard Kelch PT Acute Rehabilitation Services Office [phone] Weekend [phone]    Rada Hay 08/11/2023, 1:04 PM

## 2023-08-11 NOTE — Progress Notes (Signed)
[facility] PROGRESS NOTE    Progress Note  Danielle [name]  GEX:[id number] DOB: 1950-11-11 DOA: 08/07/2023 PCP: [facility]     Brief Narrative:   Danielle [name] is an 72 y.o. female past medical history significant for bipolar disorder, coronary artery disease, essential hypertension history of CVA who resides at a nursing home facility comes in with shortness of breath and cough likely due to COPD exacerbation in the setting of parainfluenza    Assessment/Plan:   Acute respiratory failure with hypoxia in the setting of acute COPD exacerbation due to parainfluenza virus: Continue IV azithromycin inhalers and steroids.  She has remained afebrile. No events overnight saturating greater 96% 2 L of oxygen. Continue current regimen with inhalers. Transferred to MedSurg. Physical therapy recommended home health, discontinue cardiac monitoring.  Leukocytosis: Likely due to steroids  Tobacco abuse: Continue nicotine 21 mg.  Hyperlipidemia: Continue statins.  History of polysubstance abuse: Noted.  History of schizophrenia: Continue sertraline and Seroquel they will be seen on hydroxyzine.  Essential hypertension: Continue amlodipine.  Coronary artery disease: Noted.  Lactic acidosis: Likely due to hypoxia now resolved.  Hypophosphatemia: Replete orally try to keep greater than 3.  Metabolic acidosis:  Likely due to lactic acidosis now resolved.  Overweight: Counseling.    DVT prophylaxis: lovenox Family Communication:none Status is: Inpatient Remains inpatient appropriate because: Acute respiratory failure with hypoxia due to COPD exacerbation.    Code Status:     Code Status Orders  (From admission, onward)           Start     Ordered   08/07/23 2036  Full code  Continuous       Question:  By:  Answer:  Consent: discussion documented in EHR   08/07/23 2038           Code Status History     Date Active Date  Inactive Code Status Order ID Comments User Context   10/15/2022 1748 10/19/2022 1810 Full Code [id number]  Osvaldo Shipper, MD ED   05/11/2022 1322 05/14/2022 1913 Full Code [id number]  Carollee Herter, DO Inpatient   12/30/2020 1318 12/31/2020 2146 Full Code [id number]  Chotiner, Claudean Severance, MD Inpatient   03/21/2019 1653 03/23/2019 2010 Full Code [id number]  Nyra Market, MD ED   08/10/2018 1658 08/14/2018 2139 Full Code [id number]  Zigmund Daniel., MD ED   05/23/2014 2209 05/27/2014 1905 Full Code [id number]  Ky Barban, MD Inpatient   12/09/2013 0054 12/12/2013 1533 Full Code [id number]  Hillary Bow, DO ED   08/25/2012 1958 08/26/2012 0413 Full Code [id number]  Hilario Quarry, MD ED   04/27/2012 1322 04/28/2012 0002 Full Code [id number]  Lottie Mussel, PA ED   01/21/2012 0754 01/22/2012 0208 Full Code [id number]  Lear Ng., MD ED   09/14/2011 2327 09/16/2011 1716 Full Code [id number]  Chesley Noon, RN Inpatient         IV Access:   Peripheral IV   Procedures and diagnostic studies:   DG CHEST PORT 1 VIEW  Result Date: 08/10/2023 CLINICAL DATA:  Shortness of breath. EXAM: PORTABLE CHEST 1 VIEW COMPARISON:  Chest radiograph 08/08/2023 FINDINGS: Assessment of the mediastinal structures is limited by rightward patient rotation. Lung volumes are low with mild pulmonary vascular congestion and new bibasilar opacities. No large pleural effusion or pneumothorax is identified, however the right lung apex is obscured by the patient's chin, in the left lateral costophrenic angle was incompletely imaged. IMPRESSION: Limited, rotated examination. Low lung  volumes with mild pulmonary vascular congestion and bibasilar opacities which may reflect atelectasis. Radiographic follow-up is recommended. Electronically Signed   By: Sebastian Ache M.D.   On: 08/10/2023 09:28     Medical Consultants:   None.   Subjective:    Danielle [name] s rates her breathing is improved compared to  yesterday.  Objective:    Vitals:   08/11/23 0000 08/11/23 0031 08/11/23 0259 08/11/23 0405  BP: 105/62     Pulse: 76     Resp: (!) 33     Temp:  98.4 F (36.9 C)  98.6 F (37 C)  TempSrc:  Oral  Oral  SpO2: 100%  98%   Weight:      Height:       SpO2: 98 % O2 Flow Rate (L/min): 2 L/min FiO2 (%): 28 %   Intake/Output Summary (Last 24 hours) at 08/11/2023 0716 Last data filed at 08/10/2023 1830 Gross per 24 hour  Intake 240 ml  Output 1200 ml  Net -960 ml   Filed Weights   08/07/23 2212  Weight: 70.8 kg    Exam: General exam: In no acute distress. Respiratory system: Good air movement clear to auscultation, she has a upper respiratory rhonchi or wheezing Cardiovascular system: S1 & S2 heard, RRR. No JVD. Gastrointestinal system: Abdomen is nondistended, soft and nontender.  Extremities: No pedal edema. Skin: No rashes, lesions or ulcers Psychiatry: Judgement and insight appear normal. Mood & affect appropriate.   Data Reviewed:    Labs: Basic Metabolic Panel: Recent Labs  Lab 08/07/23 1843 08/08/23 0446 08/09/23 0340 08/10/23 0316 08/11/23 0327  NA 138 137 138 138  --   K 3.2* 3.7 3.9 4.0  --   CL 104 105 106 106  --   CO2 24 21* 24 24  --   GLUCOSE 125* 151* 160* 145*  --   BUN 12 15 16 17   --   CREATININE 0.81 0.77 0.63 0.56  --   CALCIUM 9.1 9.4 9.5 9.2  --   MG  --   --  2.3 2.3  --   PHOS  --   --  2.3* 2.1* 3.0   GFR Estimated Creatinine Clearance: 61.8 mL/min (by C-G formula based on SCr of 0.56 mg/dL). Liver Function Tests: Recent Labs  Lab 08/07/23 1843 08/08/23 0446 08/09/23 0340 08/10/23 0316  AST 20 18 18 19   ALT 13 14 17 13   ALKPHOS 64 62 66 58  BILITOT 0.5 0.3 0.3 0.5  PROT 6.6 6.6 7.3 6.8  ALBUMIN 3.8 3.6 3.9 3.5   No results for input(s): "LIPASE", "AMYLASE" in the last 168 hours. No results for input(s): "AMMONIA" in the last 168 hours. Coagulation profile No results for input(s): "INR", "PROTIME" in the last 168  hours. COVID-19 Labs  No results for input(s): "DDIMER", "FERRITIN", "LDH", "CRP" in the last 72 hours.  Lab Results  Component Value Date   SARSCOV2NAA NEGATIVE 08/07/2023   SARSCOV2NAA NEGATIVE 10/15/2022   SARSCOV2NAA NEGATIVE 12/31/2020   SARSCOV2NAA NEGATIVE 12/29/2020    CBC: Recent Labs  Lab 08/07/23 1843 08/08/23 0446 08/09/23 0340 08/10/23 0316  WBC 8.3 4.4 17.3* 16.6*  NEUTROABS 3.8  --  15.5* 15.0*  HGB 13.4 12.9 13.4 13.4  HCT 39.9 39.3 40.4 41.3  MCV 92.6 93.6 91.0 92.8  PLT 222 211 226 239   Cardiac Enzymes: No results for input(s): "CKTOTAL", "CKMB", "CKMBINDEX", "TROPONINI" in the last 168 hours. BNP (last 3 results) No results for input(s): "  PROBNP" in the last 8760 hours. CBG: No results for input(s): "GLUCAP" in the last 168 hours. D-Dimer: No results for input(s): "DDIMER" in the last 72 hours. Hgb A1c: No results for input(s): "HGBA1C" in the last 72 hours. Lipid Profile: No results for input(s): "CHOL", "HDL", "LDLCALC", "TRIG", "CHOLHDL", "LDLDIRECT" in the last 72 hours. Thyroid function studies: No results for input(s): "TSH", "T4TOTAL", "T3FREE", "THYROIDAB" in the last 72 hours.  Invalid input(s): "FREET3" Anemia work up: No results for input(s): "VITAMINB12", "FOLATE", "FERRITIN", "TIBC", "IRON", "RETICCTPCT" in the last 72 hours. Sepsis Labs: Recent Labs  Lab 08/07/23 1843 08/07/23 1859 08/08/23 0446 08/08/23 0928 08/09/23 0340 08/09/23 0848 08/09/23 1037 08/09/23 1320 08/09/23 1445 08/10/23 0316  WBC 8.3  --  4.4  --  17.3*  --   --   --   --  16.6*  LATICACIDVEN  --    < >  --    < >  --  1.4 4.1* 5.2* 2.0*  --    < > = values in this interval not displayed.   Microbiology Recent Results (from the past 240 hour(s))  Culture, blood (routine x 2)     Status: None (Preliminary result)   Collection Time: 08/07/23  7:02 PM   Specimen: BLOOD  Result Value Ref Range Status   Specimen Description   Final    BLOOD BLOOD LEFT  HAND Performed at [facility], [address] [place], [place] [address]    Special Requests   Final    Blood Culture adequate volume BOTTLES DRAWN AEROBIC AND ANAEROBIC Performed at [facility], [address], [place], [place] [address]    Culture   Final    NO GROWTH 3 DAYS Performed at [facility], [address], [place], [place] [address]    Report Status PENDING  Incomplete  Resp panel by RT-PCR (RSV, Flu A&B, Covid) Anterior Nasal Swab     Status: None   Collection Time: 08/07/23  7:06 PM   Specimen: Anterior Nasal Swab  Result Value Ref Range Status   SARS Coronavirus 2 by RT PCR NEGATIVE NEGATIVE Final    Comment: (NOTE) SARS-CoV-2 target nucleic acids are NOT DETECTED.  The SARS-CoV-2 RNA is generally detectable in upper respiratory specimens during the acute phase of infection. The lowest concentration of SARS-CoV-2 viral copies this assay can detect is 138 copies/mL. A negative result does not preclude SARS-Cov-2 infection and should not be used as the sole basis for treatment or other patient management decisions. A negative result may occur with  improper specimen collection/handling, submission of specimen other than nasopharyngeal swab, presence of viral mutation(s) within the areas targeted by this assay, and inadequate number of viral copies(<138 copies/mL). A negative result must be combined with clinical observations, patient history, and epidemiological information. The expected result is Negative.  Fact Sheet for Patients:  BloggerCourse.com  Fact Sheet for Healthcare Providers:  SeriousBroker.it  This test is no t yet approved or cleared by the [place] FDA and  has been authorized for detection and/or diagnosis of SARS-CoV-2 by FDA under an Emergency Use Authorization (EUA). This EUA will remain  in effect (meaning this test can be used) for the  duration of the COVID-19 declaration under Section 564(b)(1) of the Act, 21 U.S.C.section 360bbb-3(b)(1), unless the authorization is terminated  or revoked sooner.       Influenza A by PCR NEGATIVE NEGATIVE Final   Influenza B by PCR NEGATIVE NEGATIVE Final  Comment: (NOTE) The Xpert Xpress SARS-CoV-2/FLU/RSV plus assay is intended as an aid in the diagnosis of influenza from Nasopharyngeal swab specimens and should not be used as a sole basis for treatment. Nasal washings and aspirates are unacceptable for Xpert Xpress SARS-CoV-2/FLU/RSV testing.  Fact Sheet for Patients: BloggerCourse.com  Fact Sheet for Healthcare Providers: SeriousBroker.it  This test is not yet approved or cleared by the [place] FDA and has been authorized for detection and/or diagnosis of SARS-CoV-2 by FDA under an Emergency Use Authorization (EUA). This EUA will remain in effect (meaning this test can be used) for the duration of the COVID-19 declaration under Section 564(b)(1) of the Act, 21 U.S.C. section 360bbb-3(b)(1), unless the authorization is terminated or revoked.     Resp Syncytial Virus by PCR NEGATIVE NEGATIVE Final    Comment: (NOTE) Fact Sheet for Patients: BloggerCourse.com  Fact Sheet for Healthcare Providers: SeriousBroker.it  This test is not yet approved or cleared by the [place] FDA and has been authorized for detection and/or diagnosis of SARS-CoV-2 by FDA under an Emergency Use Authorization (EUA). This EUA will remain in effect (meaning this test can be used) for the duration of the COVID-19 declaration under Section 564(b)(1) of the Act, 21 U.S.C. section 360bbb-3(b)(1), unless the authorization is terminated or revoked.  Performed at [facility], [address], [place], [place] [address]   Culture, blood (routine x 2)     Status: None  (Preliminary result)   Collection Time: 08/07/23  8:30 PM   Specimen: BLOOD  Result Value Ref Range Status   Specimen Description   Final    BLOOD Performed at [facility], [address] [place], [place] [address]    Special Requests   Final    BLOOD RIGHT FOREARM BOTTLES DRAWN AEROBIC AND ANAEROBIC Performed at [facility], [address] [place], [place] [address]    Culture   Final    NO GROWTH 3 DAYS Performed at [facility], [address], [place], [place] [address]    Report Status PENDING  Incomplete  Respiratory (~20 pathogens) panel by PCR     Status: Abnormal   Collection Time: 08/08/23 10:30 AM   Specimen: Nasopharyngeal Swab; Respiratory  Result Value Ref Range Status   Adenovirus NOT DETECTED NOT DETECTED Final   Coronavirus 229E NOT DETECTED NOT DETECTED Final    Comment: (NOTE) The Coronavirus on the Respiratory Panel, DOES NOT test for the novel  Coronavirus (2019 nCoV)    Coronavirus HKU1 NOT DETECTED NOT DETECTED Final   Coronavirus NL63 NOT DETECTED NOT DETECTED Final   Coronavirus OC43 NOT DETECTED NOT DETECTED Final   Metapneumovirus NOT DETECTED NOT DETECTED Final   Rhinovirus / Enterovirus NOT DETECTED NOT DETECTED Final   Influenza A NOT DETECTED NOT DETECTED Final   Influenza B NOT DETECTED NOT DETECTED Final   Parainfluenza Virus 1 NOT DETECTED NOT DETECTED Final   Parainfluenza Virus 2 NOT DETECTED NOT DETECTED Final   Parainfluenza Virus 3 NOT DETECTED NOT DETECTED Final   Parainfluenza Virus 4 DETECTED (A) NOT DETECTED Final   Respiratory Syncytial Virus NOT DETECTED NOT DETECTED Final   Bordetella pertussis NOT DETECTED NOT DETECTED Final   Bordetella Parapertussis NOT DETECTED NOT DETECTED Final   Chlamydophila pneumoniae NOT DETECTED NOT DETECTED Final   Mycoplasma pneumoniae NOT DETECTED NOT DETECTED Final    Comment: Performed at [facility], [address], [place], [place]  [address]  MRSA Next Gen by PCR, Nasal     Status: None   Collection Time: 08/08/23  3:45 PM   Specimen: Nasal Mucosa; Nasal Swab  Result Value Ref Range Status   MRSA by PCR Next Gen NOT DETECTED NOT DETECTED Final    Comment: (NOTE) The GeneXpert MRSA Assay (FDA approved for NASAL specimens only), is one component of a comprehensive MRSA colonization surveillance program. It is not intended to diagnose MRSA infection nor to guide or monitor treatment for MRSA infections. Test performance is not FDA approved in patients less than 21 years old. Performed at [facility], [address], [place], [place] [address]      Medications:    amLODipine  5 mg Oral Daily   arformoterol  15 mcg Nebulization BID   atorvastatin  10 mg Oral Daily   budesonide (PULMICORT) nebulizer solution  0.25 mg Nebulization BID   Chlorhexidine Gluconate Cloth  6 each Topical Daily   enoxaparin (LOVENOX) injection  40 mg Subcutaneous Q24H   guaiFENesin  1,200 mg Oral BID   ipratropium  0.5 mg Nebulization Q6H   latanoprost  1 drop Both Eyes QHS   levalbuterol  0.63 mg Nebulization Q6H   loratadine  10 mg Oral Daily   metoprolol tartrate  25 mg Oral BID   nicotine  21 mg Transdermal Daily   pantoprazole  40 mg Oral Daily   predniSONE  50 mg Oral Q breakfast   pregabalin  75 mg Oral TID   QUEtiapine  200 mg Oral QHS   sertraline  100 mg Oral Daily   valbenazine  80 mg Oral Daily   Continuous Infusions:  azithromycin Stopped (08/10/23 1205)   sodium chloride Stopped (08/09/23 1521)      LOS: 4 days   Marinda Elk  [facility]  08/11/2023, 7:16 AM

## 2023-08-11 NOTE — Plan of Care (Signed)
  Problem: Clinical Measurements: Goal: Ability to maintain clinical measurements within normal limits will improve Outcome: Progressing Goal: Respiratory complications will improve Outcome: Progressing   Problem: Education: Goal: Knowledge of General Education information will improve Description: Including pain rating scale, medication(s)/side effects and non-pharmacologic comfort measures Outcome: Adequate for Discharge   Problem: Health Behavior/Discharge Planning: Goal: Ability to manage health-related needs will improve Outcome: Adequate for Discharge

## 2023-08-12 DIAGNOSIS — J204 Acute bronchitis due to parainfluenza virus: Secondary | ICD-10-CM | POA: Diagnosis not present

## 2023-08-12 DIAGNOSIS — J9601 Acute respiratory failure with hypoxia: Secondary | ICD-10-CM | POA: Diagnosis not present

## 2023-08-12 LAB — CULTURE, BLOOD (ROUTINE X 2)
Culture: NO GROWTH
Culture: NO GROWTH
Special Requests: ADEQUATE

## 2023-08-12 NOTE — Plan of Care (Signed)

## 2023-08-12 NOTE — Progress Notes (Signed)
[facility] PROGRESS NOTE    Progress Note  Danielle [name]  ZOX:[id number] DOB: 31-Jan-1951 DOA: 08/07/2023 PCP: [facility]     Brief Narrative:   Danielle [name] is an 72 y.o. female past medical history significant for bipolar disorder, coronary artery disease, essential hypertension history of CVA who resides at a nursing home facility comes in with shortness of breath and cough likely due to COPD exacerbation in the setting of parainfluenza.  Assessment/Plan:   Acute respiratory failure with hypoxia in the setting of acute COPD exacerbation due to parainfluenza virus: Continue IV azithromycin inhalers and steroids.  She has remained afebrile. Still wheezing on physical exam still requiring 2 L of oxygen to keep saturation greater 90%. Out of bed to chair, try to wean to room air. Physical therapy recommended home health, discontinue cardiac monitoring.  Leukocytosis: Likely due to steroids  Tobacco abuse: Continue nicotine 21 mg.  Hyperlipidemia: Continue statins.  History of polysubstance abuse: Noted.  History of schizophrenia: Continue sertraline and Seroquel they will be seen on hydroxyzine.  Essential hypertension: Continue amlodipine.  Coronary artery disease: Noted.  Lactic acidosis: Likely due to hypoxia now resolved.  Hypophosphatemia: Replete orally try to keep greater than 3.  Metabolic acidosis:  Likely due to lactic acidosis now resolved.  Overweight: Counseling.    DVT prophylaxis: lovenox Family Communication:none Status is: Inpatient Remains inpatient appropriate because: Acute respiratory failure with hypoxia due to COPD exacerbation.    Code Status:     Code Status Orders  (From admission, onward)           Start     Ordered   08/07/23 2036  Full code  Continuous       Question:  By:  Answer:  Consent: discussion documented in EHR   08/07/23 2038           Code Status History     Date  Active Date Inactive Code Status Order ID Comments User Context   10/15/2022 1748 10/19/2022 1810 Full Code [id number]  Osvaldo Shipper, MD ED   05/11/2022 1322 05/14/2022 1913 Full Code [id number]  Carollee Herter, DO Inpatient   12/30/2020 1318 12/31/2020 2146 Full Code [id number]  Chotiner, Claudean Severance, MD Inpatient   03/21/2019 1653 03/23/2019 2010 Full Code [id number]  Nyra Market, MD ED   08/10/2018 1658 08/14/2018 2139 Full Code [id number]  Zigmund Daniel., MD ED   05/23/2014 2209 05/27/2014 1905 Full Code [id number]  Ky Barban, MD Inpatient   12/09/2013 0054 12/12/2013 1533 Full Code [id number]  Hillary Bow, DO ED   08/25/2012 1958 08/26/2012 0413 Full Code [id number]  Hilario Quarry, MD ED   04/27/2012 1322 04/28/2012 0002 Full Code [id number]  Lottie Mussel, PA ED   01/21/2012 0754 01/22/2012 0208 Full Code [id number]  Lear Ng., MD ED   09/14/2011 2327 09/16/2011 1716 Full Code [id number]  Chesley Noon, RN Inpatient         IV Access:   Peripheral IV   Procedures and diagnostic studies:   No results found.   Medical Consultants:   None.   Subjective:    Danielle [name] relates her breathing continues to improve.  Objective:    Vitals:   08/11/23 2100 08/12/23 0106 08/12/23 0502 08/12/23 0733  BP: 134/77 118/75 124/77   Pulse: 91 72 75   Resp: (!) 22 18 18    Temp: 98.4 F (36.9 C) 97.6 F (36.4 C) 97.8 F (36.6 C)   TempSrc: Oral  Oral Oral   SpO2: 100% 100% 100% 97%  Weight:      Height:       SpO2: 97 % O2 Flow Rate (L/min): 2 L/min FiO2 (%): 28 %   Intake/Output Summary (Last 24 hours) at 08/12/2023 0855 Last data filed at 08/12/2023 0500 Gross per 24 hour  Intake 970 ml  Output 2300 ml  Net -1330 ml   Filed Weights   08/07/23 2212  Weight: 70.8 kg    Exam: General exam: In no acute distress. Respiratory system: Good air movement and lower lungs are clear to auscultation she continues to have upper respiratory  rhonchi Cardiovascular system: S1 & S2 heard, RRR. No JVD. Gastrointestinal system: Abdomen is nondistended, soft and nontender.  Extremities: No pedal edema. Skin: No rashes, lesions or ulcers Psychiatry: Judgement and insight appear normal. Mood & affect appropriate.   Data Reviewed:    Labs: Basic Metabolic Panel: Recent Labs  Lab 08/07/23 1843 08/08/23 0446 08/09/23 0340 08/10/23 0316 08/11/23 0327  NA 138 137 138 138  --   K 3.2* 3.7 3.9 4.0  --   CL 104 105 106 106  --   CO2 24 21* 24 24  --   GLUCOSE 125* 151* 160* 145*  --   BUN 12 15 16 17   --   CREATININE 0.81 0.77 0.63 0.56  --   CALCIUM 9.1 9.4 9.5 9.2  --   MG  --   --  2.3 2.3  --   PHOS  --   --  2.3* 2.1* 3.0   GFR Estimated Creatinine Clearance: 61.8 mL/min (by C-G formula based on SCr of 0.56 mg/dL). Liver Function Tests: Recent Labs  Lab 08/07/23 1843 08/08/23 0446 08/09/23 0340 08/10/23 0316  AST 20 18 18 19   ALT 13 14 17 13   ALKPHOS 64 62 66 58  BILITOT 0.5 0.3 0.3 0.5  PROT 6.6 6.6 7.3 6.8  ALBUMIN 3.8 3.6 3.9 3.5   No results for input(s): "LIPASE", "AMYLASE" in the last 168 hours. No results for input(s): "AMMONIA" in the last 168 hours. Coagulation profile No results for input(s): "INR", "PROTIME" in the last 168 hours. COVID-19 Labs  No results for input(s): "DDIMER", "FERRITIN", "LDH", "CRP" in the last 72 hours.  Lab Results  Component Value Date   SARSCOV2NAA NEGATIVE 08/07/2023   SARSCOV2NAA NEGATIVE 10/15/2022   SARSCOV2NAA NEGATIVE 12/31/2020   SARSCOV2NAA NEGATIVE 12/29/2020    CBC: Recent Labs  Lab 08/07/23 1843 08/08/23 0446 08/09/23 0340 08/10/23 0316  WBC 8.3 4.4 17.3* 16.6*  NEUTROABS 3.8  --  15.5* 15.0*  HGB 13.4 12.9 13.4 13.4  HCT 39.9 39.3 40.4 41.3  MCV 92.6 93.6 91.0 92.8  PLT 222 211 226 239   Cardiac Enzymes: No results for input(s): "CKTOTAL", "CKMB", "CKMBINDEX", "TROPONINI" in the last 168 hours. BNP (last 3 results) No results for  input(s): "PROBNP" in the last 8760 hours. CBG: No results for input(s): "GLUCAP" in the last 168 hours. D-Dimer: No results for input(s): "DDIMER" in the last 72 hours. Hgb A1c: No results for input(s): "HGBA1C" in the last 72 hours. Lipid Profile: No results for input(s): "CHOL", "HDL", "LDLCALC", "TRIG", "CHOLHDL", "LDLDIRECT" in the last 72 hours. Thyroid function studies: No results for input(s): "TSH", "T4TOTAL", "T3FREE", "THYROIDAB" in the last 72 hours.  Invalid input(s): "FREET3" Anemia work up: No results for input(s): "VITAMINB12", "FOLATE", "FERRITIN", "TIBC", "IRON", "RETICCTPCT" in the last 72 hours. Sepsis Labs: Recent Labs  Lab 08/07/23 1843 08/07/23  1859 08/08/23 0446 08/08/23 0928 08/09/23 0340 08/09/23 0848 08/09/23 1037 08/09/23 1320 08/09/23 1445 08/10/23 0316  WBC 8.3  --  4.4  --  17.3*  --   --   --   --  16.6*  LATICACIDVEN  --    < >  --    < >  --  1.4 4.1* 5.2* 2.0*  --    < > = values in this interval not displayed.   Microbiology Recent Results (from the past 240 hour(s))  Culture, blood (routine x 2)     Status: None   Collection Time: 08/07/23  7:02 PM   Specimen: BLOOD  Result Value Ref Range Status   Specimen Description   Final    BLOOD BLOOD LEFT HAND Performed at [facility], [address] [place], [place] [address]    Special Requests   Final    Blood Culture adequate volume BOTTLES DRAWN AEROBIC AND ANAEROBIC Performed at [facility], [address], [place], [place] [address]    Culture   Final    NO GROWTH 5 DAYS Performed at [facility], [address] [place], [place] [address]    Report Status 08/12/2023 FINAL  Final  Resp panel by RT-PCR (RSV, Flu A&B, Covid) Anterior Nasal Swab     Status: None   Collection Time: 08/07/23  7:06 PM   Specimen: Anterior Nasal Swab  Result Value Ref Range Status   SARS Coronavirus 2 by RT PCR NEGATIVE NEGATIVE Final    Comment:  (NOTE) SARS-CoV-2 target nucleic acids are NOT DETECTED.  The SARS-CoV-2 RNA is generally detectable in upper respiratory specimens during the acute phase of infection. The lowest concentration of SARS-CoV-2 viral copies this assay can detect is 138 copies/mL. A negative result does not preclude SARS-Cov-2 infection and should not be used as the sole basis for treatment or other patient management decisions. A negative result may occur with  improper specimen collection/handling, submission of specimen other than nasopharyngeal swab, presence of viral mutation(s) within the areas targeted by this assay, and inadequate number of viral copies(<138 copies/mL). A negative result must be combined with clinical observations, patient history, and epidemiological information. The expected result is Negative.  Fact Sheet for Patients:  BloggerCourse.com  Fact Sheet for Healthcare Providers:  SeriousBroker.it  This test is no t yet approved or cleared by the [place] FDA and  has been authorized for detection and/or diagnosis of SARS-CoV-2 by FDA under an Emergency Use Authorization (EUA). This EUA will remain  in effect (meaning this test can be used) for the duration of the COVID-19 declaration under Section 564(b)(1) of the Act, 21 U.S.C.section 360bbb-3(b)(1), unless the authorization is terminated  or revoked sooner.       Influenza A by PCR NEGATIVE NEGATIVE Final   Influenza B by PCR NEGATIVE NEGATIVE Final    Comment: (NOTE) The Xpert Xpress SARS-CoV-2/FLU/RSV plus assay is intended as an aid in the diagnosis of influenza from Nasopharyngeal swab specimens and should not be used as a sole basis for treatment. Nasal washings and aspirates are unacceptable for Xpert Xpress SARS-CoV-2/FLU/RSV testing.  Fact Sheet for Patients: BloggerCourse.com  Fact Sheet for Healthcare  Providers: SeriousBroker.it  This test is not yet approved or cleared by the [place] FDA and has been authorized for detection and/or diagnosis of SARS-CoV-2 by FDA under an Emergency Use Authorization (EUA). This EUA will remain in effect (meaning this test can be used) for  the duration of the COVID-19 declaration under Section 564(b)(1) of the Act, 21 U.S.C. section 360bbb-3(b)(1), unless the authorization is terminated or revoked.     Resp Syncytial Virus by PCR NEGATIVE NEGATIVE Final    Comment: (NOTE) Fact Sheet for Patients: BloggerCourse.com  Fact Sheet for Healthcare Providers: SeriousBroker.it  This test is not yet approved or cleared by the [place] FDA and has been authorized for detection and/or diagnosis of SARS-CoV-2 by FDA under an Emergency Use Authorization (EUA). This EUA will remain in effect (meaning this test can be used) for the duration of the COVID-19 declaration under Section 564(b)(1) of the Act, 21 U.S.C. section 360bbb-3(b)(1), unless the authorization is terminated or revoked.  Performed at [facility], [address] [place], [place] [address]   Culture, blood (routine x 2)     Status: None   Collection Time: 08/07/23  8:30 PM   Specimen: BLOOD  Result Value Ref Range Status   Specimen Description   Final    BLOOD Performed at [facility], [address] [place], [place] [address]    Special Requests   Final    BLOOD RIGHT FOREARM BOTTLES DRAWN AEROBIC AND ANAEROBIC Performed at [facility], [address] [place], [place] [address]    Culture   Final    NO GROWTH 5 DAYS Performed at [facility], [address], [place], [place] [address]    Report Status 08/12/2023 FINAL  Final  Respiratory (~20 pathogens) panel by PCR     Status: Abnormal   Collection Time: 08/08/23 10:30 AM    Specimen: Nasopharyngeal Swab; Respiratory  Result Value Ref Range Status   Adenovirus NOT DETECTED NOT DETECTED Final   Coronavirus 229E NOT DETECTED NOT DETECTED Final    Comment: (NOTE) The Coronavirus on the Respiratory Panel, DOES NOT test for the novel  Coronavirus (2019 nCoV)    Coronavirus HKU1 NOT DETECTED NOT DETECTED Final   Coronavirus NL63 NOT DETECTED NOT DETECTED Final   Coronavirus OC43 NOT DETECTED NOT DETECTED Final   Metapneumovirus NOT DETECTED NOT DETECTED Final   Rhinovirus / Enterovirus NOT DETECTED NOT DETECTED Final   Influenza A NOT DETECTED NOT DETECTED Final   Influenza B NOT DETECTED NOT DETECTED Final   Parainfluenza Virus 1 NOT DETECTED NOT DETECTED Final   Parainfluenza Virus 2 NOT DETECTED NOT DETECTED Final   Parainfluenza Virus 3 NOT DETECTED NOT DETECTED Final   Parainfluenza Virus 4 DETECTED (A) NOT DETECTED Final   Respiratory Syncytial Virus NOT DETECTED NOT DETECTED Final   Bordetella pertussis NOT DETECTED NOT DETECTED Final   Bordetella Parapertussis NOT DETECTED NOT DETECTED Final   Chlamydophila pneumoniae NOT DETECTED NOT DETECTED Final   Mycoplasma pneumoniae NOT DETECTED NOT DETECTED Final    Comment: Performed at [facility], [address], [place], [place] [address]  MRSA Next Gen by PCR, Nasal     Status: None   Collection Time: 08/08/23  3:45 PM   Specimen: Nasal Mucosa; Nasal Swab  Result Value Ref Range Status   MRSA by PCR Next Gen NOT DETECTED NOT DETECTED Final    Comment: (NOTE) The GeneXpert MRSA Assay (FDA approved for NASAL specimens only), is one component of a comprehensive MRSA colonization surveillance program. It is not intended to diagnose MRSA infection nor to guide or monitor treatment for MRSA infections. Test performance is not FDA approved in patients less than 85 years old. Performed at [facility], [address], [place], [place] [address]      Medications:    amLODipine   5 mg Oral Daily   arformoterol  15 mcg Nebulization BID   atorvastatin  10 mg Oral Daily   azithromycin  500 mg Oral Once   budesonide (PULMICORT) nebulizer solution  0.25 mg Nebulization BID   Chlorhexidine Gluconate Cloth  6 each Topical Daily   enoxaparin (LOVENOX) injection  40 mg Subcutaneous Q24H   guaiFENesin  1,200 mg Oral BID   ipratropium  0.5 mg Nebulization Q6H   latanoprost  1 drop Both Eyes QHS   levalbuterol  0.63 mg Nebulization Q6H   loratadine  10 mg Oral Daily   metoprolol tartrate  25 mg Oral BID   nicotine  21 mg Transdermal Daily   pantoprazole  40 mg Oral Daily   predniSONE  50 mg Oral Q breakfast   pregabalin  75 mg Oral TID   QUEtiapine  200 mg Oral QHS   sertraline  100 mg Oral Daily   valbenazine  80 mg Oral Daily   Continuous Infusions:  sodium chloride Stopped (08/09/23 1521)      LOS: 5 days   Marinda Elk  [facility]  08/12/2023, 8:55 AM

## 2023-08-12 NOTE — Plan of Care (Signed)
  Problem: Education: Goal: Knowledge of General Education information will improve Description: Including pain rating scale, medication(s)/side effects and non-pharmacologic comfort measures Outcome: Progressing   Problem: Clinical Measurements: Goal: Ability to maintain clinical measurements within normal limits will improve Outcome: Progressing Goal: Will remain free from infection Outcome: Progressing Goal: Respiratory complications will improve Outcome: Progressing   Problem: Nutrition: Goal: Adequate nutrition will be maintained Outcome: Progressing   Problem: Coping: Goal: Level of anxiety will decrease Outcome: Progressing   Problem: Pain Managment: Goal: General experience of comfort will improve Outcome: Progressing   Problem: Safety: Goal: Ability to remain free from injury will improve Outcome: Progressing   Problem: Skin Integrity: Goal: Risk for impaired skin integrity will decrease Outcome: Progressing

## 2023-08-13 DIAGNOSIS — J204 Acute bronchitis due to parainfluenza virus: Secondary | ICD-10-CM | POA: Diagnosis not present

## 2023-08-13 DIAGNOSIS — J9601 Acute respiratory failure with hypoxia: Secondary | ICD-10-CM | POA: Diagnosis not present

## 2023-08-13 MED ORDER — PHENOL 1.4 % MT LIQD
1.0000 | OROMUCOSAL | Status: DC | PRN
  Filled 2023-08-13: qty 177

## 2023-08-13 NOTE — Plan of Care (Signed)

## 2023-08-13 NOTE — Plan of Care (Signed)
  Problem: Education: Goal: Knowledge of General Education information will improve Description: Including pain rating scale, medication(s)/side effects and non-pharmacologic comfort measures Outcome: Progressing   Problem: Clinical Measurements: Goal: Ability to maintain clinical measurements within normal limits will improve Outcome: Progressing Goal: Will remain free from infection Outcome: Progressing   Problem: Activity: Goal: Risk for activity intolerance will decrease Outcome: Progressing   Problem: Elimination: Goal: Will not experience complications related to urinary retention Outcome: Progressing   Problem: Pain Managment: Goal: General experience of comfort will improve Outcome: Progressing   Problem: Safety: Goal: Ability to remain free from injury will improve Outcome: Progressing

## 2023-08-13 NOTE — Progress Notes (Signed)
Mobility Specialist - Progress Note   08/13/23 1341  Oxygen Therapy  SpO2 (!) 85 %  O2 Device Room Air  Patient Activity (if Appropriate) Ambulating  Mobility  Activity Ambulated with assistance in hallway  Level of Assistance Standby assist, set-up cues, supervision of patient - no hands on  Assistive Device Front wheel walker  Distance Ambulated (ft) 80 ft  Activity Response Tolerated well  Mobility Referral Yes  $Mobility charge 1 Mobility  Mobility Specialist Start Time (ACUTE ONLY) 0120  Mobility Specialist Stop Time (ACUTE ONLY) 0140  Mobility Specialist Time Calculation (min) (ACUTE ONLY) 20 min   Nurse requested Mobility Specialist to perform oxygen saturation test with pt which includes removing pt from oxygen both at rest and while ambulating.  Below are the results from that testing.     Patient Saturations on Room Air at Rest = spO2 90%  Patient Saturations on Room Air while Ambulating = sp02 85% .    Patient Saturations on 2 Liters of oxygen while Ambulating = sp02 91%  At end of testing pt left in room on 2  Liters of oxygen.  Reported results to nurse.  Pt received EOB and agreeable to mobility. No complaints during session. Pt to recliner after session for meal with all needs met.   Pre-mobility: 90% SpO2 (RA) During mobility: 85% SpO2 (RA) Post-mobility: 91% SPO2 (2L [place])  Chief Technology Officer

## 2023-08-13 NOTE — Progress Notes (Signed)
[facility] PROGRESS NOTE    Progress Note  Danielle [name]  ZOX:[id number] DOB: 1951-03-28 DOA: 08/07/2023 PCP: [facility]     Brief Narrative:   Danielle [name] is an 72 y.o. female past medical history significant for bipolar disorder, coronary artery disease, essential hypertension history of CVA who resides at a nursing home facility comes in with shortness of breath and cough likely due to COPD exacerbation in the setting of parainfluenza.  Assessment/Plan:   Acute respiratory failure with hypoxia in the setting of acute COPD exacerbation due to parainfluenza virus: Continue IV azithromycin for 1 more day. Transition her steroids to orals. Still requiring 2 L keep saturation greater 90% took her oxygen off in the room and she was breathing well. Try to wean to room air I have informed the nurse. Physical therapy recommended home health, discontinue cardiac monitoring.  Leukocytosis: Likely due to steroids  Tobacco abuse: Continue nicotine 21 mg.  Hyperlipidemia: Continue statins.  History of polysubstance abuse: Noted.  History of schizophrenia: Continue sertraline and Seroquel they will be seen on hydroxyzine.  Essential hypertension: Continue amlodipine.  Coronary artery disease: Noted.  Lactic acidosis: Likely due to hypoxia now resolved.  Hypophosphatemia: Replete orally try to keep greater than 3.  Metabolic acidosis:  Likely due to lactic acidosis now resolved.  Overweight: Counseling.    DVT prophylaxis: lovenox Family Communication:none Status is: Inpatient Remains inpatient appropriate because: Acute respiratory failure with hypoxia due to COPD exacerbation.    Code Status:     Code Status Orders  (From admission, onward)           Start     Ordered   08/07/23 2036  Full code  Continuous       Question:  By:  Answer:  Consent: discussion documented in EHR   08/07/23 2038           Code Status  History     Date Active Date Inactive Code Status Order ID Comments User Context   10/15/2022 1748 10/19/2022 1810 Full Code [id number]  Osvaldo Shipper, MD ED   05/11/2022 1322 05/14/2022 1913 Full Code [id number]  Carollee Herter, DO Inpatient   12/30/2020 1318 12/31/2020 2146 Full Code [id number]  Chotiner, Claudean Severance, MD Inpatient   03/21/2019 1653 03/23/2019 2010 Full Code [id number]  Nyra Market, MD ED   08/10/2018 1658 08/14/2018 2139 Full Code [id number]  Zigmund Daniel., MD ED   05/23/2014 2209 05/27/2014 1905 Full Code [id number]  Ky Barban, MD Inpatient   12/09/2013 0054 12/12/2013 1533 Full Code [id number]  Hillary Bow, DO ED   08/25/2012 1958 08/26/2012 0413 Full Code [id number]  Hilario Quarry, MD ED   04/27/2012 1322 04/28/2012 0002 Full Code [id number]  Lottie Mussel, PA ED   01/21/2012 0754 01/22/2012 0208 Full Code [id number]  Lear Ng., MD ED   09/14/2011 2327 09/16/2011 1716 Full Code [id number]  Chesley Noon, RN Inpatient         IV Access:   Peripheral IV   Procedures and diagnostic studies:   No results found.   Medical Consultants:   None.   Subjective:    [name] relates her breathing is improved continues to have a cough.  Objective:    Vitals:   08/12/23 0733 08/12/23 1241 08/12/23 2050 08/13/23 0504  BP:  121/77 (!) 130/94 132/80  Pulse:  81 92 75  Resp:  20 20 20   Temp:  97.9 F (36.6 C) 98.6 F (  37 C) 98.4 F (36.9 C)  TempSrc:  Oral Oral Oral  SpO2: 97% 96% 100% 98%  Weight:      Height:       SpO2: 98 % O2 Flow Rate (L/min): 2 L/min FiO2 (%): 28 %   Intake/Output Summary (Last 24 hours) at 08/13/2023 0936 Last data filed at 08/13/2023 0552 Gross per 24 hour  Intake --  Output 500 ml  Net -500 ml   Filed Weights   08/07/23 2212  Weight: 70.8 kg    Exam: General exam: In no acute distress. Respiratory system: Good air movement and clear to auscultation. Cardiovascular system: S1 & S2 heard, RRR. No  JVD. Gastrointestinal system: Abdomen is nondistended, soft and nontender.  Extremities: No pedal edema. Skin: No rashes, lesions or ulcers Psychiatry: Judgement and insight appear normal. Mood & affect appropriate.   Data Reviewed:    Labs: Basic Metabolic Panel: Recent Labs  Lab 08/07/23 1843 08/08/23 0446 08/09/23 0340 08/10/23 0316 08/11/23 0327  NA 138 137 138 138  --   K 3.2* 3.7 3.9 4.0  --   CL 104 105 106 106  --   CO2 24 21* 24 24  --   GLUCOSE 125* 151* 160* 145*  --   BUN 12 15 16 17   --   CREATININE 0.81 0.77 0.63 0.56  --   CALCIUM 9.1 9.4 9.5 9.2  --   MG  --   --  2.3 2.3  --   PHOS  --   --  2.3* 2.1* 3.0   GFR Estimated Creatinine Clearance: 61.8 mL/min (by C-G formula based on SCr of 0.56 mg/dL). Liver Function Tests: Recent Labs  Lab 08/07/23 1843 08/08/23 0446 08/09/23 0340 08/10/23 0316  AST 20 18 18 19   ALT 13 14 17 13   ALKPHOS 64 62 66 58  BILITOT 0.5 0.3 0.3 0.5  PROT 6.6 6.6 7.3 6.8  ALBUMIN 3.8 3.6 3.9 3.5   No results for input(s): "LIPASE", "AMYLASE" in the last 168 hours. No results for input(s): "AMMONIA" in the last 168 hours. Coagulation profile No results for input(s): "INR", "PROTIME" in the last 168 hours. COVID-19 Labs  No results for input(s): "DDIMER", "FERRITIN", "LDH", "CRP" in the last 72 hours.  Lab Results  Component Value Date   SARSCOV2NAA NEGATIVE 08/07/2023   SARSCOV2NAA NEGATIVE 10/15/2022   SARSCOV2NAA NEGATIVE 12/31/2020   SARSCOV2NAA NEGATIVE 12/29/2020    CBC: Recent Labs  Lab 08/07/23 1843 08/08/23 0446 08/09/23 0340 08/10/23 0316  WBC 8.3 4.4 17.3* 16.6*  NEUTROABS 3.8  --  15.5* 15.0*  HGB 13.4 12.9 13.4 13.4  HCT 39.9 39.3 40.4 41.3  MCV 92.6 93.6 91.0 92.8  PLT 222 211 226 239   Cardiac Enzymes: No results for input(s): "CKTOTAL", "CKMB", "CKMBINDEX", "TROPONINI" in the last 168 hours. BNP (last 3 results) No results for input(s): "PROBNP" in the last 8760 hours. CBG: No results  for input(s): "GLUCAP" in the last 168 hours. D-Dimer: No results for input(s): "DDIMER" in the last 72 hours. Hgb A1c: No results for input(s): "HGBA1C" in the last 72 hours. Lipid Profile: No results for input(s): "CHOL", "HDL", "LDLCALC", "TRIG", "CHOLHDL", "LDLDIRECT" in the last 72 hours. Thyroid function studies: No results for input(s): "TSH", "T4TOTAL", "T3FREE", "THYROIDAB" in the last 72 hours.  Invalid input(s): "FREET3" Anemia work up: No results for input(s): "VITAMINB12", "FOLATE", "FERRITIN", "TIBC", "IRON", "RETICCTPCT" in the last 72 hours. Sepsis Labs: Recent Labs  Lab 08/07/23 1843 08/07/23 1859 08/08/23  6045 08/08/23 0928 08/09/23 0340 08/09/23 0848 08/09/23 1037 08/09/23 1320 08/09/23 1445 08/10/23 0316  WBC 8.3  --  4.4  --  17.3*  --   --   --   --  16.6*  LATICACIDVEN  --    < >  --    < >  --  1.4 4.1* 5.2* 2.0*  --    < > = values in this interval not displayed.   Microbiology Recent Results (from the past 240 hour(s))  Culture, blood (routine x 2)     Status: None   Collection Time: 08/07/23  7:02 PM   Specimen: BLOOD  Result Value Ref Range Status   Specimen Description   Final    BLOOD BLOOD LEFT HAND Performed at [facility], [address] [place], [place] [address]    Special Requests   Final    Blood Culture adequate volume BOTTLES DRAWN AEROBIC AND ANAEROBIC Performed at [facility], [address] [place], [place] [address]    Culture   Final    NO GROWTH 5 DAYS Performed at [facility], [address], [place], [place] [address]    Report Status 08/12/2023 FINAL  Final  Resp panel by RT-PCR (RSV, Flu A&B, Covid) Anterior Nasal Swab     Status: None   Collection Time: 08/07/23  7:06 PM   Specimen: Anterior Nasal Swab  Result Value Ref Range Status   SARS Coronavirus 2 by RT PCR NEGATIVE NEGATIVE Final    Comment: (NOTE) SARS-CoV-2 target nucleic acids are NOT DETECTED.  The  SARS-CoV-2 RNA is generally detectable in upper respiratory specimens during the acute phase of infection. The lowest concentration of SARS-CoV-2 viral copies this assay can detect is 138 copies/mL. A negative result does not preclude SARS-Cov-2 infection and should not be used as the sole basis for treatment or other patient management decisions. A negative result may occur with  improper specimen collection/handling, submission of specimen other than nasopharyngeal swab, presence of viral mutation(s) within the areas targeted by this assay, and inadequate number of viral copies(<138 copies/mL). A negative result must be combined with clinical observations, patient history, and epidemiological information. The expected result is Negative.  Fact Sheet for Patients:  BloggerCourse.com  Fact Sheet for Healthcare Providers:  SeriousBroker.it  This test is no t yet approved or cleared by the [place] FDA and  has been authorized for detection and/or diagnosis of SARS-CoV-2 by FDA under an Emergency Use Authorization (EUA). This EUA will remain  in effect (meaning this test can be used) for the duration of the COVID-19 declaration under Section 564(b)(1) of the Act, 21 U.S.C.section 360bbb-3(b)(1), unless the authorization is terminated  or revoked sooner.       Influenza A by PCR NEGATIVE NEGATIVE Final   Influenza B by PCR NEGATIVE NEGATIVE Final    Comment: (NOTE) The Xpert Xpress SARS-CoV-2/FLU/RSV plus assay is intended as an aid in the diagnosis of influenza from Nasopharyngeal swab specimens and should not be used as a sole basis for treatment. Nasal washings and aspirates are unacceptable for Xpert Xpress SARS-CoV-2/FLU/RSV testing.  Fact Sheet for Patients: BloggerCourse.com  Fact Sheet for Healthcare Providers: SeriousBroker.it  This test is not yet approved or  cleared by the [place] FDA and has been authorized for detection and/or diagnosis of SARS-CoV-2 by FDA under an Emergency Use Authorization (EUA). This EUA will remain in effect (meaning this test can be used) for the duration  of the COVID-19 declaration under Section 564(b)(1) of the Act, 21 U.S.C. section 360bbb-3(b)(1), unless the authorization is terminated or revoked.     Resp Syncytial Virus by PCR NEGATIVE NEGATIVE Final    Comment: (NOTE) Fact Sheet for Patients: BloggerCourse.com  Fact Sheet for Healthcare Providers: SeriousBroker.it  This test is not yet approved or cleared by the [place] FDA and has been authorized for detection and/or diagnosis of SARS-CoV-2 by FDA under an Emergency Use Authorization (EUA). This EUA will remain in effect (meaning this test can be used) for the duration of the COVID-19 declaration under Section 564(b)(1) of the Act, 21 U.S.C. section 360bbb-3(b)(1), unless the authorization is terminated or revoked.  Performed at [facility], [address], [place], [place] [address]   Culture, blood (routine x 2)     Status: None   Collection Time: 08/07/23  8:30 PM   Specimen: BLOOD  Result Value Ref Range Status   Specimen Description   Final    BLOOD Performed at [facility], [address], [place], [place] [address]    Special Requests   Final    BLOOD RIGHT FOREARM BOTTLES DRAWN AEROBIC AND ANAEROBIC Performed at [facility], [address], [place], [place] [address]    Culture   Final    NO GROWTH 5 DAYS Performed at [facility], [address] [place], [place] [address]    Report Status 08/12/2023 FINAL  Final  Respiratory (~20 pathogens) panel by PCR     Status: Abnormal   Collection Time: 08/08/23 10:30 AM   Specimen: Nasopharyngeal Swab; Respiratory  Result Value Ref Range Status   Adenovirus NOT  DETECTED NOT DETECTED Final   Coronavirus 229E NOT DETECTED NOT DETECTED Final    Comment: (NOTE) The Coronavirus on the Respiratory Panel, DOES NOT test for the novel  Coronavirus (2019 nCoV)    Coronavirus HKU1 NOT DETECTED NOT DETECTED Final   Coronavirus NL63 NOT DETECTED NOT DETECTED Final   Coronavirus OC43 NOT DETECTED NOT DETECTED Final   Metapneumovirus NOT DETECTED NOT DETECTED Final   Rhinovirus / Enterovirus NOT DETECTED NOT DETECTED Final   Influenza A NOT DETECTED NOT DETECTED Final   Influenza B NOT DETECTED NOT DETECTED Final   Parainfluenza Virus 1 NOT DETECTED NOT DETECTED Final   Parainfluenza Virus 2 NOT DETECTED NOT DETECTED Final   Parainfluenza Virus 3 NOT DETECTED NOT DETECTED Final   Parainfluenza Virus 4 DETECTED (A) NOT DETECTED Final   Respiratory Syncytial Virus NOT DETECTED NOT DETECTED Final   Bordetella pertussis NOT DETECTED NOT DETECTED Final   Bordetella Parapertussis NOT DETECTED NOT DETECTED Final   Chlamydophila pneumoniae NOT DETECTED NOT DETECTED Final   Mycoplasma pneumoniae NOT DETECTED NOT DETECTED Final    Comment: Performed at [facility], [address] [place], [place] [address]  MRSA Next Gen by PCR, Nasal     Status: None   Collection Time: 08/08/23  3:45 PM   Specimen: Nasal Mucosa; Nasal Swab  Result Value Ref Range Status   MRSA by PCR Next Gen NOT DETECTED NOT DETECTED Final    Comment: (NOTE) The GeneXpert MRSA Assay (FDA approved for NASAL specimens only), is one component of a comprehensive MRSA colonization surveillance program. It is not intended to diagnose MRSA infection nor to guide or monitor treatment for MRSA infections. Test performance is not FDA approved in patients less than 32 years old. Performed at [facility],  [address], [place], [place] [address]      Medications:    amLODipine  5 mg Oral Daily   arformoterol  15 mcg Nebulization BID   atorvastatin  10 mg Oral Daily    budesonide (PULMICORT) nebulizer solution  0.25 mg Nebulization BID   Chlorhexidine Gluconate Cloth  6 each Topical Daily   enoxaparin (LOVENOX) injection  40 mg Subcutaneous Q24H   guaiFENesin  1,200 mg Oral BID   ipratropium  0.5 mg Nebulization Q6H   latanoprost  1 drop Both Eyes QHS   levalbuterol  0.63 mg Nebulization Q6H   loratadine  10 mg Oral Daily   metoprolol tartrate  25 mg Oral BID   nicotine  21 mg Transdermal Daily   pantoprazole  40 mg Oral Daily   predniSONE  50 mg Oral Q breakfast   pregabalin  75 mg Oral TID   QUEtiapine  200 mg Oral QHS   sertraline  100 mg Oral Daily   valbenazine  80 mg Oral Daily   Continuous Infusions:  sodium chloride Stopped (08/09/23 1521)      LOS: 6 days   Marinda Elk  [facility]  08/13/2023, 9:36 AM

## 2023-08-14 DIAGNOSIS — J9601 Acute respiratory failure with hypoxia: Secondary | ICD-10-CM | POA: Diagnosis not present

## 2023-08-14 DIAGNOSIS — J204 Acute bronchitis due to parainfluenza virus: Secondary | ICD-10-CM | POA: Diagnosis not present

## 2023-08-14 LAB — CREATININE, SERUM
Creatinine, Ser: 0.82 mg/dL (ref 0.44–1.00)
GFR, Estimated: >60 mL/min (ref >60)

## 2023-08-14 MED ORDER — PREDNISONE 20 MG PO TABS
30.0000 mg | ORAL_TABLET | Freq: Every day | ORAL | Status: DC
  Administered 2023-08-15: 30 mg via ORAL
  Filled 2023-08-14: qty 1

## 2023-08-14 NOTE — Progress Notes (Signed)
[facility] PROGRESS NOTE    Progress Note  Danielle [name]  ZOX:[id number] DOB: Dec 27, 1950 DOA: 08/07/2023 PCP: [facility]     Brief Narrative:   Danielle [name] is an 72 y.o. female past medical history significant for bipolar disorder, coronary artery disease, essential hypertension history of CVA who resides at a nursing home facility comes in with shortness of breath and cough likely due to COPD exacerbation in the setting of parainfluenza.  Assessment/Plan:   Acute respiratory failure with hypoxia in the setting of acute COPD exacerbation due to parainfluenza virus: Completed course of antibiotics in house, now with steroids orally will start taper. Most of the night she was on room air.  Continues inspirometry out of bed to chair. Try to wean to room air I have informed the nurse. Physical therapy evaluated the patient, she will need home health, can probably be discharged to facility tomorrow.  Leukocytosis: Likely due to steroids  Tobacco abuse: Continue nicotine 21 mg.  Hyperlipidemia: Continue statins.  History of polysubstance abuse: Noted.  History of schizophrenia: Continue sertraline and Seroquel they will be seen on hydroxyzine.  Essential hypertension: Continue amlodipine.  Coronary artery disease: Noted.  Lactic acidosis: Likely due to hypoxia now resolved.  Hypophosphatemia: Replete orally try to keep greater than 3.  Metabolic acidosis:  Likely due to lactic acidosis now resolved.  Overweight: Counseling.    DVT prophylaxis: lovenox Family Communication:none Status is: Inpatient Remains inpatient appropriate because: Acute respiratory failure with hypoxia due to COPD exacerbation.    Code Status:     Code Status Orders  (From admission, onward)           Start     Ordered   08/07/23 2036  Full code  Continuous       Question:  By:  Answer:  Consent: discussion documented in EHR   08/07/23 2038            Code Status History     Date Active Date Inactive Code Status Order ID Comments User Context   10/15/2022 1748 10/19/2022 1810 Full Code [id number]  Osvaldo Shipper, MD ED   05/11/2022 1322 05/14/2022 1913 Full Code [id number]  Carollee Herter, DO Inpatient   12/30/2020 1318 12/31/2020 2146 Full Code [id number]  Chotiner, Claudean Severance, MD Inpatient   03/21/2019 1653 03/23/2019 2010 Full Code [id number]  Nyra Market, MD ED   08/10/2018 1658 08/14/2018 2139 Full Code [id number]  Zigmund Daniel., MD ED   05/23/2014 2209 05/27/2014 1905 Full Code [id number]  Ky Barban, MD Inpatient   12/09/2013 0054 12/12/2013 1533 Full Code [id number]  Hillary Bow, DO ED   08/25/2012 1958 08/26/2012 0413 Full Code [id number]  Hilario Quarry, MD ED   04/27/2012 1322 04/28/2012 0002 Full Code [id number]  Lottie Mussel, PA ED   01/21/2012 0754 01/22/2012 0208 Full Code [id number]  Lear Ng., MD ED   09/14/2011 2327 09/16/2011 1716 Full Code [id number]  Chesley Noon, RN Inpatient         IV Access:   Peripheral IV   Procedures and diagnostic studies:   No results found.   Medical Consultants:   None.   Subjective:    Danielle [name] breathing is improved her cough is almost resolved as per patient.  Objective:    Vitals:   08/13/23 1341 08/13/23 2020 08/14/23 0457 08/14/23 0810  BP:   (!) 102/57   Pulse:   77   Resp:   18  Temp:   98.6 F (37 C)   TempSrc:   Oral   SpO2: (!) 85% 98% 96% 96%  Weight:      Height:       SpO2: 96 % O2 Flow Rate (L/min): 2 L/min FiO2 (%): 28 %  No intake or output data in the 24 hours ending 08/14/23 0825  Filed Weights   08/07/23 2212  Weight: 70.8 kg    Exam: General exam: In no acute distress. Respiratory system: Good air movement and clear to auscultation. Cardiovascular system: S1 & S2 heard, RRR. No JVD. Gastrointestinal system: Abdomen is nondistended, soft and nontender.  Extremities: No pedal edema. Skin: No  rashes, lesions or ulcers Psychiatry: Judgement and insight appear normal. Mood & affect appropriate.  Data Reviewed:    Labs: Basic Metabolic Panel: Recent Labs  Lab 08/07/23 1843 08/08/23 0446 08/09/23 0340 08/10/23 0316 08/11/23 0327 08/14/23 0537  NA 138 137 138 138  --   --   K 3.2* 3.7 3.9 4.0  --   --   CL 104 105 106 106  --   --   CO2 24 21* 24 24  --   --   GLUCOSE 125* 151* 160* 145*  --   --   BUN 12 15 16 17   --   --   CREATININE 0.81 0.77 0.63 0.56  --  0.82  CALCIUM 9.1 9.4 9.5 9.2  --   --   MG  --   --  2.3 2.3  --   --   PHOS  --   --  2.3* 2.1* 3.0  --    GFR Estimated Creatinine Clearance: 60.3 mL/min (by C-G formula based on SCr of 0.82 mg/dL). Liver Function Tests: Recent Labs  Lab 08/07/23 1843 08/08/23 0446 08/09/23 0340 08/10/23 0316  AST 20 18 18 19   ALT 13 14 17 13   ALKPHOS 64 62 66 58  BILITOT 0.5 0.3 0.3 0.5  PROT 6.6 6.6 7.3 6.8  ALBUMIN 3.8 3.6 3.9 3.5   No results for input(s): "LIPASE", "AMYLASE" in the last 168 hours. No results for input(s): "AMMONIA" in the last 168 hours. Coagulation profile No results for input(s): "INR", "PROTIME" in the last 168 hours. COVID-19 Labs  No results for input(s): "DDIMER", "FERRITIN", "LDH", "CRP" in the last 72 hours.  Lab Results  Component Value Date   SARSCOV2NAA NEGATIVE 08/07/2023   SARSCOV2NAA NEGATIVE 10/15/2022   SARSCOV2NAA NEGATIVE 12/31/2020   SARSCOV2NAA NEGATIVE 12/29/2020    CBC: Recent Labs  Lab 08/07/23 1843 08/08/23 0446 08/09/23 0340 08/10/23 0316  WBC 8.3 4.4 17.3* 16.6*  NEUTROABS 3.8  --  15.5* 15.0*  HGB 13.4 12.9 13.4 13.4  HCT 39.9 39.3 40.4 41.3  MCV 92.6 93.6 91.0 92.8  PLT 222 211 226 239   Cardiac Enzymes: No results for input(s): "CKTOTAL", "CKMB", "CKMBINDEX", "TROPONINI" in the last 168 hours. BNP (last 3 results) No results for input(s): "PROBNP" in the last 8760 hours. CBG: No results for input(s): "GLUCAP" in the last 168  hours. D-Dimer: No results for input(s): "DDIMER" in the last 72 hours. Hgb A1c: No results for input(s): "HGBA1C" in the last 72 hours. Lipid Profile: No results for input(s): "CHOL", "HDL", "LDLCALC", "TRIG", "CHOLHDL", "LDLDIRECT" in the last 72 hours. Thyroid function studies: No results for input(s): "TSH", "T4TOTAL", "T3FREE", "THYROIDAB" in the last 72 hours.  Invalid input(s): "FREET3" Anemia work up: No results for input(s): "VITAMINB12", "FOLATE", "FERRITIN", "TIBC", "IRON", "RETICCTPCT" in the last  72 hours. Sepsis Labs: Recent Labs  Lab 08/07/23 1843 08/07/23 1859 08/08/23 0446 08/08/23 0928 08/09/23 0340 08/09/23 0848 08/09/23 1037 08/09/23 1320 08/09/23 1445 08/10/23 0316  WBC 8.3  --  4.4  --  17.3*  --   --   --   --  16.6*  LATICACIDVEN  --    < >  --    < >  --  1.4 4.1* 5.2* 2.0*  --    < > = values in this interval not displayed.   Microbiology Recent Results (from the past 240 hour(s))  Culture, blood (routine x 2)     Status: None   Collection Time: 08/07/23  7:02 PM   Specimen: BLOOD  Result Value Ref Range Status   Specimen Description   Final    BLOOD BLOOD LEFT HAND Performed at [facility], [address] [place], [place] [address]    Special Requests   Final    Blood Culture adequate volume BOTTLES DRAWN AEROBIC AND ANAEROBIC Performed at [facility], [address] [place], [place] [address]    Culture   Final    NO GROWTH 5 DAYS Performed at [facility], [address], [place], [place] [address]    Report Status 08/12/2023 FINAL  Final  Resp panel by RT-PCR (RSV, Flu A&B, Covid) Anterior Nasal Swab     Status: None   Collection Time: 08/07/23  7:06 PM   Specimen: Anterior Nasal Swab  Result Value Ref Range Status   SARS Coronavirus 2 by RT PCR NEGATIVE NEGATIVE Final    Comment: (NOTE) SARS-CoV-2 target nucleic acids are NOT DETECTED.  The SARS-CoV-2 RNA is generally detectable in  upper respiratory specimens during the acute phase of infection. The lowest concentration of SARS-CoV-2 viral copies this assay can detect is 138 copies/mL. A negative result does not preclude SARS-Cov-2 infection and should not be used as the sole basis for treatment or other patient management decisions. A negative result may occur with  improper specimen collection/handling, submission of specimen other than nasopharyngeal swab, presence of viral mutation(s) within the areas targeted by this assay, and inadequate number of viral copies(<138 copies/mL). A negative result must be combined with clinical observations, patient history, and epidemiological information. The expected result is Negative.  Fact Sheet for Patients:  BloggerCourse.com  Fact Sheet for Healthcare Providers:  SeriousBroker.it  This test is no t yet approved or cleared by the [place] FDA and  has been authorized for detection and/or diagnosis of SARS-CoV-2 by FDA under an Emergency Use Authorization (EUA). This EUA will remain  in effect (meaning this test can be used) for the duration of the COVID-19 declaration under Section 564(b)(1) of the Act, 21 U.S.C.section 360bbb-3(b)(1), unless the authorization is terminated  or revoked sooner.       Influenza A by PCR NEGATIVE NEGATIVE Final   Influenza B by PCR NEGATIVE NEGATIVE Final    Comment: (NOTE) The Xpert Xpress SARS-CoV-2/FLU/RSV plus assay is intended as an aid in the diagnosis of influenza from Nasopharyngeal swab specimens and should not be used as a sole basis for treatment. Nasal washings and aspirates are unacceptable for Xpert Xpress SARS-CoV-2/FLU/RSV testing.  Fact Sheet for Patients: BloggerCourse.com  Fact Sheet for Healthcare Providers: SeriousBroker.it  This test is not yet approved or cleared by the [place] FDA and has been  authorized for detection and/or diagnosis of SARS-CoV-2 by FDA under an Emergency Use Authorization (EUA). This EUA  will remain in effect (meaning this test can be used) for the duration of the COVID-19 declaration under Section 564(b)(1) of the Act, 21 U.S.C. section 360bbb-3(b)(1), unless the authorization is terminated or revoked.     Resp Syncytial Virus by PCR NEGATIVE NEGATIVE Final    Comment: (NOTE) Fact Sheet for Patients: BloggerCourse.com  Fact Sheet for Healthcare Providers: SeriousBroker.it  This test is not yet approved or cleared by the [place] FDA and has been authorized for detection and/or diagnosis of SARS-CoV-2 by FDA under an Emergency Use Authorization (EUA). This EUA will remain in effect (meaning this test can be used) for the duration of the COVID-19 declaration under Section 564(b)(1) of the Act, 21 U.S.C. section 360bbb-3(b)(1), unless the authorization is terminated or revoked.  Performed at [facility], [address] [place], [place] [address]   Culture, blood (routine x 2)     Status: None   Collection Time: 08/07/23  8:30 PM   Specimen: BLOOD  Result Value Ref Range Status   Specimen Description   Final    BLOOD Performed at [facility], [address] [place], [place] [address]    Special Requests   Final    BLOOD RIGHT FOREARM BOTTLES DRAWN AEROBIC AND ANAEROBIC Performed at [facility], [address] [place], [place] [address]    Culture   Final    NO GROWTH 5 DAYS Performed at [facility], [address], [place], [place] [address]    Report Status 08/12/2023 FINAL  Final  Respiratory (~20 pathogens) panel by PCR     Status: Abnormal   Collection Time: 08/08/23 10:30 AM   Specimen: Nasopharyngeal Swab; Respiratory  Result Value Ref Range Status   Adenovirus NOT DETECTED NOT DETECTED Final   Coronavirus 229E  NOT DETECTED NOT DETECTED Final    Comment: (NOTE) The Coronavirus on the Respiratory Panel, DOES NOT test for the novel  Coronavirus (2019 nCoV)    Coronavirus HKU1 NOT DETECTED NOT DETECTED Final   Coronavirus NL63 NOT DETECTED NOT DETECTED Final   Coronavirus OC43 NOT DETECTED NOT DETECTED Final   Metapneumovirus NOT DETECTED NOT DETECTED Final   Rhinovirus / Enterovirus NOT DETECTED NOT DETECTED Final   Influenza A NOT DETECTED NOT DETECTED Final   Influenza B NOT DETECTED NOT DETECTED Final   Parainfluenza Virus 1 NOT DETECTED NOT DETECTED Final   Parainfluenza Virus 2 NOT DETECTED NOT DETECTED Final   Parainfluenza Virus 3 NOT DETECTED NOT DETECTED Final   Parainfluenza Virus 4 DETECTED (A) NOT DETECTED Final   Respiratory Syncytial Virus NOT DETECTED NOT DETECTED Final   Bordetella pertussis NOT DETECTED NOT DETECTED Final   Bordetella Parapertussis NOT DETECTED NOT DETECTED Final   Chlamydophila pneumoniae NOT DETECTED NOT DETECTED Final   Mycoplasma pneumoniae NOT DETECTED NOT DETECTED Final    Comment: Performed at [facility], [address], [place], [place] [address]  MRSA Next Gen by PCR, Nasal     Status: None   Collection Time: 08/08/23  3:45 PM   Specimen: Nasal Mucosa; Nasal Swab  Result Value Ref Range Status   MRSA by PCR Next Gen NOT DETECTED NOT DETECTED Final    Comment: (NOTE) The GeneXpert MRSA Assay (FDA approved for NASAL specimens only), is one component of a comprehensive MRSA colonization surveillance program. It is not intended to diagnose MRSA infection nor to guide or monitor treatment for MRSA infections. Test performance is not FDA approved  in patients less than 35 years old. Performed at [facility], [address] [place], [place] [address]      Medications:    amLODipine  5 mg Oral Daily   arformoterol  15 mcg Nebulization BID   atorvastatin  10 mg Oral Daily   budesonide (PULMICORT) nebulizer solution  0.25  mg Nebulization BID   Chlorhexidine Gluconate Cloth  6 each Topical Daily   enoxaparin (LOVENOX) injection  40 mg Subcutaneous Q24H   guaiFENesin  1,200 mg Oral BID   ipratropium  0.5 mg Nebulization Q6H   latanoprost  1 drop Both Eyes QHS   levalbuterol  0.63 mg Nebulization Q6H   loratadine  10 mg Oral Daily   metoprolol tartrate  25 mg Oral BID   nicotine  21 mg Transdermal Daily   pantoprazole  40 mg Oral Daily   predniSONE  50 mg Oral Q breakfast   pregabalin  75 mg Oral TID   QUEtiapine  200 mg Oral QHS   sertraline  100 mg Oral Daily   valbenazine  80 mg Oral Daily   Continuous Infusions:  sodium chloride Stopped (08/09/23 1521)      LOS: 7 days   Marinda Elk  [facility]  08/14/2023, 8:25 AM

## 2023-08-14 NOTE — Plan of Care (Signed)

## 2023-08-14 NOTE — Plan of Care (Signed)
  Problem: Education: Goal: Knowledge of General Education information will improve Description: Including pain rating scale, medication(s)/side effects and non-pharmacologic comfort measures Outcome: Progressing   Problem: Clinical Measurements: Goal: Ability to maintain clinical measurements within normal limits will improve Outcome: Progressing Goal: Will remain free from infection Outcome: Progressing Goal: Respiratory complications will improve Outcome: Progressing   Problem: Nutrition: Goal: Adequate nutrition will be maintained Outcome: Progressing   Problem: Pain Managment: Goal: General experience of comfort will improve Outcome: Progressing   Problem: Safety: Goal: Ability to remain free from injury will improve Outcome: Progressing

## 2023-08-15 DIAGNOSIS — F1994 Other psychoactive substance use, unspecified with psychoactive substance-induced mood disorder: Secondary | ICD-10-CM | POA: Diagnosis not present

## 2023-08-15 DIAGNOSIS — F411 Generalized anxiety disorder: Secondary | ICD-10-CM | POA: Diagnosis not present

## 2023-08-15 DIAGNOSIS — J9601 Acute respiratory failure with hypoxia: Secondary | ICD-10-CM | POA: Diagnosis not present

## 2023-08-15 MED ORDER — METOPROLOL TARTRATE 25 MG PO TABS: 25.0000 mg | ORAL_TABLET | Freq: Two times a day (BID) | ORAL | 1 refills | Status: AC

## 2023-08-15 MED ORDER — PREDNISONE 10 MG PO TABS: ORAL_TABLET | ORAL | 0 refills | Status: DC

## 2023-08-15 NOTE — TOC Transition Note (Signed)
Transition of Care [facility]) - CM/SW Discharge Note   Patient Details  Name: Quintana Peshlakai MRN: [id number] Date of Birth: 1951-05-21  Transition of [facility]) CM/SW Contact:  Otelia Santee, LCSW Phone Number: 08/15/2023, 10:27 AM   Clinical Narrative:    Pt to return to room 403 at [facility]. FL-2 and DC summary faxed to facility and emailed to [facility] for review. Spoke with facility staff Lurena Joiner and confirmed pt is able to return. No call to report needed. Spoke with pt's daughter via t/c who shares she will be picking pt up and providing transportation back to facility. RN notified. DC packet placed at RN station.   Final next level of care: Assisted Living Barriers to Discharge: Barriers Resolved   Patient Goals and CMS Choice CMS Medicare.gov Compare Post Acute Care list provided to:: Patient Choice offered to / list presented to : Patient  Discharge Placement                  Patient to be transferred to facility by: Daughter Name of family member notified: Daughter Patient and family notified of of transfer: 08/15/23  Discharge Plan and Services Additional resources added to the After Visit Summary for                  DME Arranged: N/A DME Agency: NA                  Social Determinants of Health (SDOH) Interventions SDOH Screenings   Food Insecurity: No Food Insecurity (08/08/2023)  Housing: Low Risk  (08/08/2023)  Transportation Needs: No Transportation Needs (08/08/2023)  Utilities: Not At Risk (08/08/2023)  Depression (PHQ2-9): Low Risk  (06/18/2019)  Tobacco Use: High Risk (08/08/2023)     Readmission Risk Interventions    08/15/2023   10:26 AM 10/19/2022   10:48 AM 10/18/2022   12:54 PM  Readmission Risk Prevention Plan  Transportation Screening Complete Complete Complete  PCP or Specialist Appt within 5-7 Days Complete    PCP or Specialist Appt within 3-5 Days  Complete Complete  Home Care Screening Complete    Medication  Review (RN CM) Complete    HRI or Home Care Consult  Complete Complete  Social Work Consult for Recovery Care Planning/Counseling  Complete Complete  Palliative Care Screening  Complete Complete  Medication Review Ocean[employer])  Complete Complete

## 2023-08-15 NOTE — Progress Notes (Addendum)
Dressing to PIV site changed, mod amt of bloody drainage hemostasis achieved -Pt's  daughter to get car- Pt prescriptions  were sent to [facility]. Pt given a mango icee at d/c. No O2 needed for ALF use. Both pt and daughter verbalized an understanding- pt to main lobby via w/c

## 2023-08-15 NOTE — Progress Notes (Signed)
SATURATION QUALIFICATIONS: (This note is used to comply with regulatory documentation for home oxygen)  Patient Saturations on Room Air at Rest = 96%  Patient Saturations on Room Air while Ambulating = 93%  

## 2023-08-15 NOTE — Discharge Summary (Signed)
Physician Discharge Summary  Danielle [name] QMV:[id number] DOB: [birth year] DOA: 08/07/2023  PCP: [facility]  Admit date: 08/07/2023 Discharge date: 08/15/2023  Admitted From: Home Disposition:  Home  Recommendations for Outpatient Follow-up:  Follow up with PCP in 1-2 weeks Please obtain BMP/CBC in one week   Home Health:No Equipment/Devices:None  Discharge Condition:Stable CODE STATUS:FUll Diet recommendation: Heart Healthy  Brief/Interim Summary: 71 y.o. female past medical history significant for bipolar disorder, coronary artery disease, essential hypertension history of CVA who resides at a nursing home facility comes in with shortness of breath and cough likely due to COPD exacerbation in the setting of parainfluenza.   Discharge Diagnoses:  Principal Problem:   Acute hypoxemic respiratory failure (HCC) Active Problems:   Substance induced mood disorder (HCC)   Hypokalemia   Tobacco abuse   Hyperlipidemia   ANXIETY DISORDER, GENERALIZED   Polysubstance abuse (HCC)   HYPERTENSION, BENIGN ESSENTIAL   GERD   CAD (coronary artery disease)   COPD exacerbation (HCC)  Acute respiratory failure with hypoxia in the setting of acute COPD exacerbation due to parainfluenza virus: She was started on supplemental oxygen, IV steroids and antibiotics her respiratory panel came back positive for parainfluenza. Antibiotics were discontinued she will continue steroid taper she was initially requiring oxygen and we were able to wean her to room air.  Leukocytosis likely due to steroids  Tobacco abuse: She was placed on a nicotine patch.  Hyperlipidemia: Continue statins.  History of polysubstance abuse: Noted.  History of schizophrenia: Continue Seroquel and sertraline as well as hydroxyzine no changes made.  Essential hypertension: Continue amlodipine and metoprolol.  Coronary artery disease: Noted.  Lactic acidosis/metabolic acidosis: Likely due to  hypoxia she was not septic on admission.  Hypophosphatemia: Was repleted orally now resolved.  Obesity: Noted. Discharge Instructions  Discharge Instructions     Diet - low sodium heart healthy   Complete by: As directed    Increase activity slowly   Complete by: As directed       Allergies as of 08/15/2023       Reactions   Clonazepam Swelling, Other (See Comments)   Pt was recently given a triamterene hydrochlorothiazide combination as well as clonazepam and had an allergic reaction to one of them. Caused swelling of face and eyes   Doxycycline Swelling, Other (See Comments)   Swelling of face and eyes   Fosinopril Sodium Swelling, Other (See Comments)   Swelling face,lips-angioedema from ACE I   Hydrochlorothiazide W-triamterene Swelling   Pt was recently given a triamterene hydrochlorothiazide combination as well as clonazepam and had an allergic reaction to one of them. Swelling of face and eyes   Penicillins Swelling, Other (See Comments)   Face and eyes Has patient had a PCN reaction causing immediate rash, facial/tongue/throat swelling, SOB or lightheadedness with hypotension: Yes Has patient had a PCN reaction causing severe rash involving mucus membranes or skin necrosis: No Has patient had a PCN reaction that required hospitalization: No Has patient had a PCN reaction occurring within the last 10 years: No If all of the above answers are "NO", then may proceed with Cephalosporin use.   Sulfa Antibiotics Other (See Comments)   "Allergic," per [facility]   Triamterene Other (See Comments)   Pt was recently given a triamterene hydrochlorothiazide combination as well as clonazepam and had an allergic reaction to one of them. "Allergic," per [facility]        Medication List     STOP taking these medications  nicotine 21 mg/24hr patch Commonly known as: NICODERM CQ - dosed in mg/24 hours       TAKE these medications    albuterol (2.5 MG/3ML) 0.083% nebulizer  solution Commonly known as: PROVENTIL Take 2.5 mg by nebulization every 6 (six) hours as needed for shortness of breath.   albuterol 108 (90 Base) MCG/ACT inhaler Commonly known as: VENTOLIN HFA Inhale 1-2 puffs into the lungs every 4 (four) hours as needed for wheezing or shortness of breath (or cough).   amLODipine 5 MG tablet Commonly known as: NORVASC Take 5 mg by mouth daily.   atorvastatin 10 MG tablet Commonly known as: LIPITOR Take 10 mg by mouth daily.   budesonide-formoterol 160-4.5 MCG/ACT inhaler Commonly known as: Symbicort Inhale 2 puffs into the lungs 2 (two) times daily.   DHEA 25 MG Caps Take 25 mg by mouth in the morning.   HYDROcodone-acetaminophen 10-325 MG tablet Commonly known as: NORCO Take 1 tablet by mouth See admin instructions. Give 1 tablet by mouth 5 time a day as needed   hydrOXYzine 25 MG capsule Commonly known as: VISTARIL Take 25 mg by mouth 2 (two) times daily as needed for anxiety.   Ingrezza 80 MG capsule Generic drug: valbenazine Take 1 capsule (80 mg total) by mouth daily.   latanoprost 0.005 % ophthalmic solution Commonly known as: XALATAN Place 1 drop into both eyes at bedtime.   loratadine 10 MG tablet Commonly known as: CLARITIN Take 10 mg by mouth daily.   metoprolol tartrate 25 MG tablet Commonly known as: LOPRESSOR Take 1 tablet (25 mg total) by mouth 2 (two) times daily.   predniSONE 10 MG tablet Commonly known as: DELTASONE Takes 3 tablets for 1 days, then 2 tabs for 1 days, then 1 tab for 1 days, and then stop.   pregabalin 75 MG capsule Commonly known as: LYRICA Take 75 mg by mouth 3 (three) times daily.   QUEtiapine 200 MG tablet Commonly known as: SEROQUEL Take 200 mg by mouth at bedtime.   sertraline 100 MG tablet Commonly known as: ZOLOFT Take 100 mg by mouth daily.   tiZANidine 2 MG tablet Commonly known as: ZANAFLEX Take 2 mg by mouth every 8 (eight) hours as needed for muscle spasms.    triamcinolone cream 0.1 % Commonly known as: KENALOG Apply 1 g topically See admin instructions. Apply (1 grams) green pea-sized bead to popliteal fossae twice a day   Vitamin D3 250 MCG (10000 UT) Tabs Take 10,000 Units by mouth daily.               Durable Medical Equipment  (From admission, onward)           Start     Ordered   08/15/23 0814  DME Oxygen  Once       Question Answer Comment  Length of Need 6 Months   Mode or (Route) Nasal cannula   Liters per Minute 2   Frequency Continuous (stationary and portable oxygen unit needed)   Oxygen delivery system Gas      08/15/23 0813   08/14/23 0823  For home use only DME oxygen  Once       Question Answer Comment  Length of Need 6 Months   Mode or (Route) Nasal cannula   Liters per Minute 2   Oxygen delivery system Gas      08/14/23 0822            Allergies  Allergen Reactions   Clonazepam  Swelling and Other (See Comments)    Pt was recently given a triamterene hydrochlorothiazide combination as well as clonazepam and had an allergic reaction to one of them. Caused swelling of face and eyes   Doxycycline Swelling and Other (See Comments)    Swelling of face and eyes   Fosinopril Sodium Swelling and Other (See Comments)    Swelling face,lips-angioedema from ACE I   Hydrochlorothiazide W-Triamterene Swelling    Pt was recently given a triamterene hydrochlorothiazide combination as well as clonazepam and had an allergic reaction to one of them. Swelling of face and eyes   Penicillins Swelling and Other (See Comments)    Face and eyes Has patient had a PCN reaction causing immediate rash, facial/tongue/throat swelling, SOB or lightheadedness with hypotension: Yes Has patient had a PCN reaction causing severe rash involving mucus membranes or skin necrosis: No Has patient had a PCN reaction that required hospitalization: No Has patient had a PCN reaction occurring within the last 10 years: No If all of  the above answers are "NO", then may proceed with Cephalosporin use.    Sulfa Antibiotics Other (See Comments)    "Allergic," per [facility]   Triamterene Other (See Comments)    Pt was recently given a triamterene hydrochlorothiazide combination as well as clonazepam and had an allergic reaction to one of them. "Allergic," per [facility]    Consultations: None   Procedures/Studies: DG CHEST PORT 1 VIEW  Result Date: 08/10/2023 CLINICAL DATA:  Shortness of breath. EXAM: PORTABLE CHEST 1 VIEW COMPARISON:  Chest radiograph 08/08/2023 FINDINGS: Assessment of the mediastinal structures is limited by rightward patient rotation. Lung volumes are low with mild pulmonary vascular congestion and new bibasilar opacities. No large pleural effusion or pneumothorax is identified, however the right lung apex is obscured by the patient's chin, in the left lateral costophrenic angle was incompletely imaged. IMPRESSION: Limited, rotated examination. Low lung volumes with mild pulmonary vascular congestion and bibasilar opacities which may reflect atelectasis. Radiographic follow-up is recommended. Electronically Signed   By: Sebastian Ache M.D.   On: 08/10/2023 09:28   DG CHEST PORT 1 VIEW  Result Date: 08/08/2023 CLINICAL DATA:  Shortness of breath EXAM: PORTABLE CHEST 1 VIEW COMPARISON:  08/08/2023 FINDINGS: The heart size and mediastinal contours are within normal limits. Both lungs are clear. The visualized skeletal structures are unremarkable. IMPRESSION: No active disease. Electronically Signed   By: Alcide Clever M.D.   On: 08/08/2023 22:25   DG CHEST PORT 1 VIEW  Result Date: 08/08/2023 CLINICAL DATA:  Shortness of breath. EXAM: PORTABLE CHEST 1 VIEW COMPARISON:  August 07, 2023. FINDINGS: The heart size and mediastinal contours are within normal limits. Both lungs are clear. The visualized skeletal structures are unremarkable. IMPRESSION: No active disease. Electronically Signed   By: Lupita Raider M.D.   On:  08/08/2023 15:52   DG Chest Port 1 View  Result Date: 08/07/2023 CLINICAL DATA:  Shortness of breath. Decreased oxygen saturation. Wheezing. EXAM: PORTABLE CHEST 1 VIEW COMPARISON:  10/15/2022 FINDINGS: Examination is somewhat limited due to patient rotation. As visualized, the heart size and pulmonary vascularity appear normal. Peribronchial thickening with some interstitial changes centrally are consistent with airways disease. No focal consolidation or infiltrate. No pleural effusions. No pneumothorax. Degenerative changes in the spine. IMPRESSION: Peribronchial thickening and central interstitial changes or consistent with airways disease. No focal consolidation. Electronically Signed   By: Burman Nieves M.D.   On: 08/07/2023 20:28   (Echo, Carotid, EGD, Colonoscopy, ERCP)  Subjective: No complaints  Discharge Exam: Vitals:   08/15/23 0536 08/15/23 0801  BP: 126/68   Pulse: 79   Resp: 18   Temp: 98.6 F (37 C)   SpO2: 100% 98%   Vitals:   08/14/23 2147 08/15/23 0249 08/15/23 0536 08/15/23 0801  BP: 121/79  126/68   Pulse: 86  79   Resp: 20  18   Temp: 98.3 F (36.8 C)  98.6 F (37 C)   TempSrc: Oral  Oral   SpO2: 99% 99% 100% 98%  Weight:      Height:        General: Pt is alert, awake, not in acute distress Cardiovascular: RRR, S1/S2 +, no rubs, no gallops Respiratory: CTA bilaterally, no wheezing, no rhonchi Abdominal: Soft, NT, ND, bowel sounds + Extremities: no edema, no cyanosis    The results of significant diagnostics from this hospitalization (including imaging, microbiology, ancillary and laboratory) are listed below for reference.     Microbiology: Recent Results (from the past 240 hour(s))  Culture, blood (routine x 2)     Status: None   Collection Time: 08/07/23  7:02 PM   Specimen: BLOOD  Result Value Ref Range Status   Specimen Description   Final    BLOOD BLOOD LEFT HAND Performed at [facility], [address],  [place], [place] [address]    Special Requests   Final    Blood Culture adequate volume BOTTLES DRAWN AEROBIC AND ANAEROBIC Performed at [facility], [address] [place], [place] [address]    Culture   Final    NO GROWTH 5 DAYS Performed at [facility], [address], [place], [place] [address]    Report Status 08/12/2023 FINAL  Final  Resp panel by RT-PCR (RSV, Flu A&B, Covid) Anterior Nasal Swab     Status: None   Collection Time: 08/07/23  7:06 PM   Specimen: Anterior Nasal Swab  Result Value Ref Range Status   SARS Coronavirus 2 by RT PCR NEGATIVE NEGATIVE Final    Comment: (NOTE) SARS-CoV-2 target nucleic acids are NOT DETECTED.  The SARS-CoV-2 RNA is generally detectable in upper respiratory specimens during the acute phase of infection. The lowest concentration of SARS-CoV-2 viral copies this assay can detect is 138 copies/mL. A negative result does not preclude SARS-Cov-2 infection and should not be used as the sole basis for treatment or other patient management decisions. A negative result may occur with  improper specimen collection/handling, submission of specimen other than nasopharyngeal swab, presence of viral mutation(s) within the areas targeted by this assay, and inadequate number of viral copies(<138 copies/mL). A negative result must be combined with clinical observations, patient history, and epidemiological information. The expected result is Negative.  Fact Sheet for Patients:  BloggerCourse.com  Fact Sheet for Healthcare Providers:  SeriousBroker.it  This test is no t yet approved or cleared by the [place] FDA and  has been authorized for detection and/or diagnosis of SARS-CoV-2 by FDA under an Emergency Use Authorization (EUA). This EUA will remain  in effect (meaning this test can be used) for the duration of the COVID-19 declaration under Section 564(b)(1) of the Act,  21 U.S.C.section 360bbb-3(b)(1), unless the authorization is terminated  or revoked sooner.       Influenza A by PCR NEGATIVE NEGATIVE Final   Influenza B by PCR NEGATIVE NEGATIVE Final    Comment: (NOTE) The Xpert Xpress SARS-CoV-2/FLU/RSV plus assay is intended as an aid in  the diagnosis of influenza from Nasopharyngeal swab specimens and should not be used as a sole basis for treatment. Nasal washings and aspirates are unacceptable for Xpert Xpress SARS-CoV-2/FLU/RSV testing.  Fact Sheet for Patients: BloggerCourse.com  Fact Sheet for Healthcare Providers: SeriousBroker.it  This test is not yet approved or cleared by the [place] FDA and has been authorized for detection and/or diagnosis of SARS-CoV-2 by FDA under an Emergency Use Authorization (EUA). This EUA will remain in effect (meaning this test can be used) for the duration of the COVID-19 declaration under Section 564(b)(1) of the Act, 21 U.S.C. section 360bbb-3(b)(1), unless the authorization is terminated or revoked.     Resp Syncytial Virus by PCR NEGATIVE NEGATIVE Final    Comment: (NOTE) Fact Sheet for Patients: BloggerCourse.com  Fact Sheet for Healthcare Providers: SeriousBroker.it  This test is not yet approved or cleared by the [place] FDA and has been authorized for detection and/or diagnosis of SARS-CoV-2 by FDA under an Emergency Use Authorization (EUA). This EUA will remain in effect (meaning this test can be used) for the duration of the COVID-19 declaration under Section 564(b)(1) of the Act, 21 U.S.C. section 360bbb-3(b)(1), unless the authorization is terminated or revoked.  Performed at [facility], [address], [place], [place] [address]   Culture, blood (routine x 2)     Status: None   Collection Time: 08/07/23  8:30 PM   Specimen: BLOOD  Result Value Ref  Range Status   Specimen Description   Final    BLOOD Performed at [facility], [address] [place], [place] [address]    Special Requests   Final    BLOOD RIGHT FOREARM BOTTLES DRAWN AEROBIC AND ANAEROBIC Performed at [facility], [address] [place], [place] [address]    Culture   Final    NO GROWTH 5 DAYS Performed at [facility], [address], [place], [place] [address]    Report Status 08/12/2023 FINAL  Final  Respiratory (~20 pathogens) panel by PCR     Status: Abnormal   Collection Time: 08/08/23 10:30 AM   Specimen: Nasopharyngeal Swab; Respiratory  Result Value Ref Range Status   Adenovirus NOT DETECTED NOT DETECTED Final   Coronavirus 229E NOT DETECTED NOT DETECTED Final    Comment: (NOTE) The Coronavirus on the Respiratory Panel, DOES NOT test for the novel  Coronavirus (2019 nCoV)    Coronavirus HKU1 NOT DETECTED NOT DETECTED Final   Coronavirus NL63 NOT DETECTED NOT DETECTED Final   Coronavirus OC43 NOT DETECTED NOT DETECTED Final   Metapneumovirus NOT DETECTED NOT DETECTED Final   Rhinovirus / Enterovirus NOT DETECTED NOT DETECTED Final   Influenza A NOT DETECTED NOT DETECTED Final   Influenza B NOT DETECTED NOT DETECTED Final   Parainfluenza Virus 1 NOT DETECTED NOT DETECTED Final   Parainfluenza Virus 2 NOT DETECTED NOT DETECTED Final   Parainfluenza Virus 3 NOT DETECTED NOT DETECTED Final   Parainfluenza Virus 4 DETECTED (A) NOT DETECTED Final   Respiratory Syncytial Virus NOT DETECTED NOT DETECTED Final   Bordetella pertussis NOT DETECTED NOT DETECTED Final   Bordetella Parapertussis NOT DETECTED NOT DETECTED Final   Chlamydophila pneumoniae NOT DETECTED NOT DETECTED Final   Mycoplasma pneumoniae NOT DETECTED NOT DETECTED Final    Comment: Performed at [facility], [address] [place], [place] [address]  MRSA Next Gen by PCR, Nasal     Status: None   Collection Time:  08/08/23  3:45 PM    Specimen: Nasal Mucosa; Nasal Swab  Result Value Ref Range Status   MRSA by PCR Next Gen NOT DETECTED NOT DETECTED Final    Comment: (NOTE) The GeneXpert MRSA Assay (FDA approved for NASAL specimens only), is one component of a comprehensive MRSA colonization surveillance program. It is not intended to diagnose MRSA infection nor to guide or monitor treatment for MRSA infections. Test performance is not FDA approved in patients less than 65 years old. Performed at [facility], [address] [place], [place] [address]      Labs: BNP (last 3 results) Recent Labs    10/15/22 1610 08/07/23 1843  BNP 48.2 85.8   Basic Metabolic Panel: Recent Labs  Lab 08/09/23 0340 08/10/23 0316 08/11/23 0327 08/14/23 0537  NA 138 138  --   --   K 3.9 4.0  --   --   CL 106 106  --   --   CO2 24 24  --   --   GLUCOSE 160* 145*  --   --   BUN 16 17  --   --   CREATININE 0.63 0.56  --  0.82  CALCIUM 9.5 9.2  --   --   MG 2.3 2.3  --   --   PHOS 2.3* 2.1* 3.0  --    Liver Function Tests: Recent Labs  Lab 08/09/23 0340 08/10/23 0316  AST 18 19  ALT 17 13  ALKPHOS 66 58  BILITOT 0.3 0.5  PROT 7.3 6.8  ALBUMIN 3.9 3.5   No results for input(s): "LIPASE", "AMYLASE" in the last 168 hours. No results for input(s): "AMMONIA" in the last 168 hours. CBC: Recent Labs  Lab 08/09/23 0340 08/10/23 0316  WBC 17.3* 16.6*  NEUTROABS 15.5* 15.0*  HGB 13.4 13.4  HCT 40.4 41.3  MCV 91.0 92.8  PLT 226 239   Cardiac Enzymes: No results for input(s): "CKTOTAL", "CKMB", "CKMBINDEX", "TROPONINI" in the last 168 hours. BNP: Invalid input(s): "POCBNP" CBG: No results for input(s): "GLUCAP" in the last 168 hours. D-Dimer No results for input(s): "DDIMER" in the last 72 hours. Hgb A1c No results for input(s): "HGBA1C" in the last 72 hours. Lipid Profile No results for input(s): "CHOL", "HDL", "LDLCALC", "TRIG", "CHOLHDL", "LDLDIRECT" in the last 72 hours. Thyroid  function studies No results for input(s): "TSH", "T4TOTAL", "T3FREE", "THYROIDAB" in the last 72 hours.  Invalid input(s): "FREET3" Anemia work up No results for input(s): "VITAMINB12", "FOLATE", "FERRITIN", "TIBC", "IRON", "RETICCTPCT" in the last 72 hours. Urinalysis    Component Value Date/Time   COLORURINE YELLOW 05/03/2022 1534   APPEARANCEUR HAZY (A) 05/03/2022 1534   LABSPEC 1.030 05/03/2022 1534   PHURINE 5.0 05/03/2022 1534   GLUCOSEU NEGATIVE 05/03/2022 1534   HGBUR NEGATIVE 05/03/2022 1534   HGBUR negative 10/25/2008 0936   BILIRUBINUR NEGATIVE 05/03/2022 1534   KETONESUR NEGATIVE 05/03/2022 1534   PROTEINUR NEGATIVE 05/03/2022 1534   UROBILINOGEN 0.2 03/23/2018 1531   NITRITE NEGATIVE 05/03/2022 1534   LEUKOCYTESUR LARGE (A) 05/03/2022 1534   Sepsis Labs Recent Labs  Lab 08/09/23 0340 08/10/23 0316  WBC 17.3* 16.6*   Microbiology Recent Results (from the past 240 hour(s))  Culture, blood (routine x 2)     Status: None   Collection Time: 08/07/23  7:02 PM   Specimen: BLOOD  Result Value Ref Range Status   Specimen Description   Final    BLOOD BLOOD LEFT HAND Performed at [facility], [address], [place], [place] [address]    Special Requests   Final    Blood Culture adequate volume BOTTLES DRAWN AEROBIC AND ANAEROBIC Performed at [facility], [address], [place], [place] [address]    Culture   Final    NO GROWTH 5 DAYS Performed at [facility], [address], [place], [place] [address]    Report Status 08/12/2023 FINAL  Final  Resp panel by RT-PCR (RSV, Flu A&B, Covid) Anterior Nasal Swab     Status: None   Collection Time: 08/07/23  7:06 PM   Specimen: Anterior Nasal Swab  Result Value Ref Range Status   SARS Coronavirus 2 by RT PCR NEGATIVE NEGATIVE Final    Comment: (NOTE) SARS-CoV-2 target nucleic acids are NOT DETECTED.  The SARS-CoV-2 RNA is generally detectable in upper  respiratory specimens during the acute phase of infection. The lowest concentration of SARS-CoV-2 viral copies this assay can detect is 138 copies/mL. A negative result does not preclude SARS-Cov-2 infection and should not be used as the sole basis for treatment or other patient management decisions. A negative result may occur with  improper specimen collection/handling, submission of specimen other than nasopharyngeal swab, presence of viral mutation(s) within the areas targeted by this assay, and inadequate number of viral copies(<138 copies/mL). A negative result must be combined with clinical observations, patient history, and epidemiological information. The expected result is Negative.  Fact Sheet for Patients:  BloggerCourse.com  Fact Sheet for Healthcare Providers:  SeriousBroker.it  This test is no t yet approved or cleared by the [place] FDA and  has been authorized for detection and/or diagnosis of SARS-CoV-2 by FDA under an Emergency Use Authorization (EUA). This EUA will remain  in effect (meaning this test can be used) for the duration of the COVID-19 declaration under Section 564(b)(1) of the Act, 21 U.S.C.section 360bbb-3(b)(1), unless the authorization is terminated  or revoked sooner.       Influenza A by PCR NEGATIVE NEGATIVE Final   Influenza B by PCR NEGATIVE NEGATIVE Final    Comment: (NOTE) The Xpert Xpress SARS-CoV-2/FLU/RSV plus assay is intended as an aid in the diagnosis of influenza from Nasopharyngeal swab specimens and should not be used as a sole basis for treatment. Nasal washings and aspirates are unacceptable for Xpert Xpress SARS-CoV-2/FLU/RSV testing.  Fact Sheet for Patients: BloggerCourse.com  Fact Sheet for Healthcare Providers: SeriousBroker.it  This test is not yet approved or cleared by the [place] FDA and has been  authorized for detection and/or diagnosis of SARS-CoV-2 by FDA under an Emergency Use Authorization (EUA). This EUA will remain in effect (meaning this test can be used) for the duration of the COVID-19 declaration under Section 564(b)(1) of the Act, 21 U.S.C. section 360bbb-3(b)(1), unless the authorization is terminated or revoked.     Resp Syncytial Virus by PCR NEGATIVE NEGATIVE Final    Comment: (NOTE) Fact Sheet for Patients: BloggerCourse.com  Fact Sheet for Healthcare Providers: SeriousBroker.it  This test is not yet approved or cleared by the [place] FDA and has been authorized for detection and/or diagnosis of SARS-CoV-2 by FDA under an Emergency Use Authorization (EUA). This EUA will remain in effect (meaning this test can be used) for the duration of the COVID-19 declaration under Section 564(b)(1) of the Act, 21 U.S.C. section 360bbb-3(b)(1), unless the authorization is terminated or revoked.  Performed at [facility], [address] [place], [place] [address]   Culture,  blood (routine x 2)     Status: None   Collection Time: 08/07/23  8:30 PM   Specimen: BLOOD  Result Value Ref Range Status   Specimen Description   Final    BLOOD Performed at [facility], [address] [address], [place], [place] [address]    Special Requests   Final    BLOOD RIGHT FOREARM BOTTLES DRAWN AEROBIC AND ANAEROBIC Performed at [facility], [address] [place], [place] [address]    Culture   Final    NO GROWTH 5 DAYS Performed at [facility], [address], [place], [place] [address]    Report Status 08/12/2023 FINAL  Final  Respiratory (~20 pathogens) panel by PCR     Status: Abnormal   Collection Time: 08/08/23 10:30 AM   Specimen: Nasopharyngeal Swab; Respiratory  Result Value Ref Range Status   Adenovirus NOT DETECTED NOT DETECTED Final   Coronavirus 229E  NOT DETECTED NOT DETECTED Final    Comment: (NOTE) The Coronavirus on the Respiratory Panel, DOES NOT test for the novel  Coronavirus (2019 nCoV)    Coronavirus HKU1 NOT DETECTED NOT DETECTED Final   Coronavirus NL63 NOT DETECTED NOT DETECTED Final   Coronavirus OC43 NOT DETECTED NOT DETECTED Final   Metapneumovirus NOT DETECTED NOT DETECTED Final   Rhinovirus / Enterovirus NOT DETECTED NOT DETECTED Final   Influenza A NOT DETECTED NOT DETECTED Final   Influenza B NOT DETECTED NOT DETECTED Final   Parainfluenza Virus 1 NOT DETECTED NOT DETECTED Final   Parainfluenza Virus 2 NOT DETECTED NOT DETECTED Final   Parainfluenza Virus 3 NOT DETECTED NOT DETECTED Final   Parainfluenza Virus 4 DETECTED (A) NOT DETECTED Final   Respiratory Syncytial Virus NOT DETECTED NOT DETECTED Final   Bordetella pertussis NOT DETECTED NOT DETECTED Final   Bordetella Parapertussis NOT DETECTED NOT DETECTED Final   Chlamydophila pneumoniae NOT DETECTED NOT DETECTED Final   Mycoplasma pneumoniae NOT DETECTED NOT DETECTED Final    Comment: Performed at [facility], [address], [place], [place] [address]  MRSA Next Gen by PCR, Nasal     Status: None   Collection Time: 08/08/23  3:45 PM   Specimen: Nasal Mucosa; Nasal Swab  Result Value Ref Range Status   MRSA by PCR Next Gen NOT DETECTED NOT DETECTED Final    Comment: (NOTE) The GeneXpert MRSA Assay (FDA approved for NASAL specimens only), is one component of a comprehensive MRSA colonization surveillance program. It is not intended to diagnose MRSA infection nor to guide or monitor treatment for MRSA infections. Test performance is not FDA approved in patients less than 34 years old. Performed at [facility], [address] [place], [place] [address]      Time coordinating discharge: Over 35 minutes  SIGNED:   Marinda Elk, MD  [facility] 08/15/2023, 8:13 AM Pager   If 7PM-7AM, please contact  night-coverage [email or web address] Password [email or web address]

## 2023-12-25 ENCOUNTER — Inpatient Hospital Stay (HOSPITAL_COMMUNITY)
Admission: EM | Admit: 2023-12-25 | Discharge: 2023-12-29 | DRG: 871 | Disposition: A | Discharge status: Skilled Nursing Facility | Source: Skilled Nursing Facility | Attending: Internal Medicine | Admitting: Internal Medicine

## 2023-12-25 ENCOUNTER — Encounter (HOSPITAL_COMMUNITY): Payer: Self-pay

## 2023-12-25 ENCOUNTER — Emergency Department (HOSPITAL_COMMUNITY)

## 2023-12-25 ENCOUNTER — Other Ambulatory Visit: Payer: Self-pay

## 2023-12-25 DIAGNOSIS — I2510 Atherosclerotic heart disease of native coronary artery without angina pectoris: Secondary | ICD-10-CM | POA: Diagnosis not present

## 2023-12-25 DIAGNOSIS — Z1152 Encounter for screening for COVID-19: Secondary | ICD-10-CM

## 2023-12-25 DIAGNOSIS — J101 Influenza due to other identified influenza virus with other respiratory manifestations: Secondary | ICD-10-CM

## 2023-12-25 DIAGNOSIS — Z8249 Family history of ischemic heart disease and other diseases of the circulatory system: Secondary | ICD-10-CM

## 2023-12-25 DIAGNOSIS — J441 Chronic obstructive pulmonary disease with (acute) exacerbation: Principal | ICD-10-CM

## 2023-12-25 DIAGNOSIS — F200 Paranoid schizophrenia: Secondary | ICD-10-CM

## 2023-12-25 DIAGNOSIS — F17210 Nicotine dependence, cigarettes, uncomplicated: Secondary | ICD-10-CM | POA: Diagnosis present

## 2023-12-25 DIAGNOSIS — Z8673 Personal history of transient ischemic attack (TIA), and cerebral infarction without residual deficits: Secondary | ICD-10-CM

## 2023-12-25 DIAGNOSIS — Z882 Allergy status to sulfonamides status: Secondary | ICD-10-CM

## 2023-12-25 DIAGNOSIS — Z79899 Other long term (current) drug therapy: Secondary | ICD-10-CM

## 2023-12-25 DIAGNOSIS — Z881 Allergy status to other antibiotic agents status: Secondary | ICD-10-CM

## 2023-12-25 DIAGNOSIS — R0603 Acute respiratory distress: Secondary | ICD-10-CM | POA: Diagnosis not present

## 2023-12-25 DIAGNOSIS — Z90710 Acquired absence of both cervix and uterus: Secondary | ICD-10-CM

## 2023-12-25 DIAGNOSIS — Z888 Allergy status to other drugs, medicaments and biological substances status: Secondary | ICD-10-CM

## 2023-12-25 DIAGNOSIS — J9811 Atelectasis: Secondary | ICD-10-CM | POA: Diagnosis present

## 2023-12-25 DIAGNOSIS — Z823 Family history of stroke: Secondary | ICD-10-CM

## 2023-12-25 DIAGNOSIS — I10 Essential (primary) hypertension: Secondary | ICD-10-CM | POA: Diagnosis not present

## 2023-12-25 DIAGNOSIS — Z7951 Long term (current) use of inhaled steroids: Secondary | ICD-10-CM

## 2023-12-25 DIAGNOSIS — E872 Acidosis, unspecified: Secondary | ICD-10-CM | POA: Diagnosis present

## 2023-12-25 DIAGNOSIS — R413 Other amnesia: Secondary | ICD-10-CM

## 2023-12-25 DIAGNOSIS — A4189 Other specified sepsis: Principal | ICD-10-CM | POA: Diagnosis present

## 2023-12-25 DIAGNOSIS — J9601 Acute respiratory failure with hypoxia: Principal | ICD-10-CM | POA: Diagnosis present

## 2023-12-25 DIAGNOSIS — Z880 Allergy status to penicillin: Secondary | ICD-10-CM

## 2023-12-25 LAB — I-STAT VENOUS BLOOD GAS, ED
Acid-Base Excess: 3 mmol/L — ABNORMAL HIGH (ref 0.0–2.0)
Bicarbonate: 27.4 mmol/L (ref 20.0–28.0)
Calcium, Ion: 1.04 mmol/L — ABNORMAL LOW (ref 1.15–1.40)
HCT: 42 % (ref 36.0–46.0)
Hemoglobin: 14.3 g/dL (ref 12.0–15.0)
O2 Saturation: 90 %
Potassium: 5.5 mmol/L — ABNORMAL HIGH (ref 3.5–5.1)
Sodium: 131 mmol/L — ABNORMAL LOW (ref 135–145)
TCO2: 29 mmol/L (ref 22–32)
pCO2, Ven: 39.3 mmHg — ABNORMAL LOW (ref 44–60)
pH, Ven: 7.452 — ABNORMAL HIGH (ref 7.25–7.43)
pO2, Ven: 57 mmHg — ABNORMAL HIGH (ref 32–45)

## 2023-12-25 LAB — CBC WITH DIFFERENTIAL/PLATELET
Abs Immature Granulocytes: 0.05 10*3/uL (ref 0.00–0.07)
Basophils Absolute: 0 10*3/uL (ref 0.0–0.1)
Basophils Relative: 0 %
Eosinophils Absolute: 0 10*3/uL (ref 0.0–0.5)
Eosinophils Relative: 0 %
HCT: 39.9 % (ref 36.0–46.0)
Hemoglobin: 13.1 g/dL (ref 12.0–15.0)
Immature Granulocytes: 1 %
Lymphocytes Relative: 10 %
Lymphs Abs: 0.9 10*3/uL (ref 0.7–4.0)
MCH: 30.4 pg (ref 26.0–34.0)
MCHC: 32.8 g/dL (ref 30.0–36.0)
MCV: 92.6 fL (ref 80.0–100.0)
Monocytes Absolute: 0.9 10*3/uL (ref 0.1–1.0)
Monocytes Relative: 10 %
Neutro Abs: 7.1 10*3/uL (ref 1.7–7.7)
Neutrophils Relative %: 79 %
Platelets: 224 10*3/uL (ref 150–400)
RBC: 4.31 MIL/uL (ref 3.87–5.11)
RDW: 13.4 % (ref 11.5–15.5)
WBC: 9 10*3/uL (ref 4.0–10.5)
nRBC: 0 % (ref 0.0–0.2)

## 2023-12-25 LAB — I-STAT CHEM 8, ED
BUN: 15 mg/dL (ref 8–23)
Calcium, Ion: 0.97 mmol/L — ABNORMAL LOW (ref 1.15–1.40)
Chloride: 103 mmol/L (ref 98–111)
Creatinine, Ser: 0.9 mg/dL (ref 0.44–1.00)
Glucose, Bld: 131 mg/dL — ABNORMAL HIGH (ref 70–99)
HCT: 43 % (ref 36.0–46.0)
Hemoglobin: 14.6 g/dL (ref 12.0–15.0)
Potassium: 8 mmol/L (ref 3.5–5.1)
Sodium: 128 mmol/L — ABNORMAL LOW (ref 135–145)
TCO2: 27 mmol/L (ref 22–32)

## 2023-12-25 LAB — RESP PANEL BY RT-PCR (RSV, FLU A&B, COVID)  RVPGX2
Influenza A by PCR: POSITIVE — AB
Influenza B by PCR: NEGATIVE
Resp Syncytial Virus by PCR: NEGATIVE
SARS Coronavirus 2 by RT PCR: NEGATIVE

## 2023-12-25 LAB — COMPREHENSIVE METABOLIC PANEL
ALT: 13 U/L (ref 0–44)
AST: 26 U/L (ref 15–41)
Albumin: 3.9 g/dL (ref 3.5–5.0)
Alkaline Phosphatase: 61 U/L (ref 38–126)
Anion gap: 15 (ref 5–15)
BUN: 12 mg/dL (ref 8–23)
CO2: 24 mmol/L (ref 22–32)
Calcium: 9.6 mg/dL (ref 8.9–10.3)
Chloride: 96 mmol/L — ABNORMAL LOW (ref 98–111)
Creatinine, Ser: 1.07 mg/dL — ABNORMAL HIGH (ref 0.44–1.00)
GFR, Estimated: 56 mL/min — ABNORMAL LOW (ref >60)
Glucose, Bld: 137 mg/dL — ABNORMAL HIGH (ref 70–99)
Potassium: 4.2 mmol/L (ref 3.5–5.1)
Sodium: 135 mmol/L (ref 135–145)
Total Bilirubin: 0.7 mg/dL (ref 0.0–1.2)
Total Protein: 6.9 g/dL (ref 6.5–8.1)

## 2023-12-25 LAB — APTT: aPTT: 29 s (ref 24–36)

## 2023-12-25 LAB — BRAIN NATRIURETIC PEPTIDE: B Natriuretic Peptide: 94.2 pg/mL (ref 0.0–100.0)

## 2023-12-25 LAB — I-STAT CG4 LACTIC ACID, ED: Lactic Acid, Venous: 2.2 mmol/L (ref 0.5–1.9)

## 2023-12-25 LAB — PROTIME-INR
INR: 1.1 (ref 0.8–1.2)
Prothrombin Time: 14.4 s (ref 11.4–15.2)

## 2023-12-25 MED ORDER — SODIUM CHLORIDE 0.9 % IV SOLN
500.0000 mg | Freq: Once | INTRAVENOUS | Status: AC
  Administered 2023-12-25: 500 mg via INTRAVENOUS
  Filled 2023-12-25: qty 5

## 2023-12-25 MED ORDER — METHYLPREDNISOLONE SODIUM SUCC 125 MG IJ SOLR
125.0000 mg | Freq: Once | INTRAMUSCULAR | Status: AC
Start: 2023-12-25 — End: 2023-12-25
  Administered 2023-12-25: 125 mg via INTRAVENOUS
  Filled 2023-12-25: qty 2

## 2023-12-25 MED ORDER — IPRATROPIUM-ALBUTEROL 0.5-2.5 (3) MG/3ML IN SOLN
3.0000 mL | RESPIRATORY_TRACT | Status: AC
  Administered 2023-12-25 (×3): 3 mL via RESPIRATORY_TRACT
  Filled 2023-12-25: qty 9

## 2023-12-25 MED ORDER — SODIUM CHLORIDE 0.9 % IV SOLN
2.0000 g | Freq: Once | INTRAVENOUS | Status: AC
  Administered 2023-12-25: 2 g via INTRAVENOUS
  Filled 2023-12-25: qty 20

## 2023-12-25 MED ORDER — MAGNESIUM SULFATE 2 GM/50ML IV SOLN
2.0000 g | Freq: Once | INTRAVENOUS | Status: AC
  Administered 2023-12-25: 2 g via INTRAVENOUS
  Filled 2023-12-25: qty 50

## 2023-12-25 MED ORDER — SODIUM CHLORIDE 0.9 % IV SOLN: 1.0000 g | Freq: Once | INTRAVENOUS | Status: DC

## 2023-12-25 MED ORDER — LACTATED RINGERS IV BOLUS (SEPSIS)
500.0000 mL | Freq: Once | INTRAVENOUS | Status: AC
  Administered 2023-12-25: 500 mL via INTRAVENOUS

## 2023-12-25 MED ORDER — LACTATED RINGERS IV SOLN: INTRAVENOUS | Status: DC

## 2023-12-25 NOTE — ED Triage Notes (Signed)
 Pt arrives EMS from ALF with reports of respiratory distress. On EMS arrival pt 70% on RA. Pt unable to speak in sentences but able to nod and follow commands intermittently. Pt was given 10mg  albuterol, atrovent with EMS

## 2023-12-25 NOTE — ED Provider Notes (Signed)
 [place] EMERGENCY DEPARTMENT AT [facility] Provider Note   CSN: [id number] Arrival date & time: 12/25/23  2132     History  Chief Complaint  Patient presents with  . Respiratory Distress    Danielle [name] is a 73 y.o. female with PMH as listed below who presents BIBEMS from ALF with reports of respiratory distress. On EMS arrival pt 70% on RA. Pt unable to speak in sentences but able to nod and follow commands. Pt was given 10mg  albuterol, atrovent with EMS. Initially had diminished breath sounds but per EMS after breathing treatments she began to wheeze. EtCO2 with EMS was 40.  On arrival patient is in significant respiratory distress with diffuse wheezing in bilateral lung fields. She reports SOB and denies chest pain. Further history from patient limited d/t resp distress.    Past Medical History:  Diagnosis Date  . Asthma   . Bipolar affective disorder, currently depressed, mild (HCC)   . Chest pain   . Complication of anesthesia    " My eyes swell up "  . Coronary artery disease   . Depression   . Fall 08/25/2012   Recent fall with residual knee and back pain.  . Family history of anesthesia complication    " [name] "  . GERD (gastroesophageal reflux disease)   . Hypertension   . Memory disturbance 01/30/2016  . Stroke (HCC)   . Syncope and collapse 03/21/2019  . Tardive dyskinesia   . Tobacco abuse        Home Medications Prior to Admission medications   Medication Sig Start Date End Date Taking? Authorizing Provider  albuterol (PROVENTIL) (2.5 MG/3ML) 0.083% nebulizer solution Take 2.5 mg by nebulization every 6 (six) hours as needed for shortness of breath.    [provider]  albuterol (VENTOLIN HFA) 108 (90 Base) MCG/ACT inhaler Inhale 1-2 puffs into the lungs every 4 (four) hours as needed for wheezing or shortness of breath (or cough). 10/19/22   Glade Lloyd, MD  amLODipine (NORVASC) 5 MG tablet Take 5 mg by mouth daily.     [provider]  atorvastatin (LIPITOR) 10 MG tablet Take 10 mg by mouth daily.    [provider]  budesonide-formoterol (SYMBICORT) 160-4.5 MCG/ACT inhaler Inhale 2 puffs into the lungs 2 (two) times daily. 10/19/22   Glade Lloyd, MD  Cholecalciferol (VITAMIN D3) 250 MCG (10000 UT) TABS Take 10,000 Units by mouth daily.    [provider]  DHEA 25 MG CAPS Take 25 mg by mouth in the morning.    [provider]  HYDROcodone-acetaminophen (NORCO) 10-325 MG tablet Take 1 tablet by mouth See admin instructions. Give 1 tablet by mouth 5 time a day as needed    [provider]  hydrOXYzine (VISTARIL) 25 MG capsule Take 25 mg by mouth 2 (two) times daily as needed for anxiety.    [provider]  latanoprost (XALATAN) 0.005 % ophthalmic solution Place 1 drop into both eyes at bedtime.    [provider]  loratadine (CLARITIN) 10 MG tablet Take 10 mg by mouth daily.    [provider]  metoprolol tartrate (LOPRESSOR) 25 MG tablet Take 1 tablet (25 mg total) by mouth 2 (two) times daily. 08/15/23   Marinda Elk, MD  predniSONE (DELTASONE) 10 MG tablet Takes 3 tablets for 1 days, then 2 tabs for 1 days, then 1 tab for 1 days, and then stop. 08/15/23   Marinda Elk, MD  pregabalin (LYRICA) 75 MG capsule Take 75 mg by mouth 3 (three) times daily. 04/07/22   [provider]  QUEtiapine (SEROQUEL) 200 MG tablet Take 200 mg by mouth at bedtime. 05/06/22   [provider]  sertraline (ZOLOFT) 100 MG tablet Take 100 mg by mouth daily.    [provider]  tiZANidine (ZANAFLEX) 2 MG tablet Take 2 mg by mouth every 8 (eight) hours as needed for muscle spasms.    [provider]  triamcinolone cream (KENALOG) 0.1 % Apply 1 g topically See admin instructions. Apply (1 grams) green pea-sized bead to popliteal fossae twice a day 04/27/22   [provider]  valbenazine (INGREZZA) 80 MG capsule  Take 1 capsule (80 mg total) by mouth daily. 05/26/23         Allergies    Clonazepam, Doxycycline, Fosinopril sodium, Hydrochlorothiazide w-triamterene, Penicillins, Sulfa antibiotics, and Triamterene    Review of Systems   Review of Systems A 10 point review of systems was performed and is negative unless otherwise reported in HPI.  Physical Exam Updated Vital Signs BP 105/82   Pulse (!) 116   Temp (!) 100.9 F (38.3 C) (Oral)   Resp 20   Wt 70.8 kg   SpO2 100%   BMI 27.65 kg/m  Physical Exam General: Acutely ill elderly female in resp distress HEENT: PERRLA, Sclera anicteric, dry mucous membranes, trachea midline.  Cardiology: Regular tachycardic rhythm, no murmurs/rubs/gallops.  Resp: Respiratory distress w/ significantly increased WOB/tachypnea. Diffuse wheezing heard throughout Abd: Soft, non-tender, non-distended. No rebound tenderness or guarding.  GU: Deferred. MSK: No peripheral edema or signs of trauma. Extremities without deformity or TTP. No cyanosis or clubbing. Skin: warm, dry.  Neuro: A&Ox4, CNs II-XII grossly intact. MAEs. Sensation grossly intact.  Psych: Normal mood and affect.   ED Results / Procedures / Treatments   Labs (all labs ordered are listed, but only abnormal results are displayed) Labs Reviewed  COMPREHENSIVE METABOLIC PANEL - Abnormal; Notable for the following components:      Result Value   Chloride 96 (*)    Glucose, Bld 137 (*)    Creatinine, Ser 1.07 (*)    GFR, Estimated 56 (*)    All other components within normal limits  I-STAT CHEM 8, ED - Abnormal; Notable for the following components:   Sodium 128 (*)    Potassium 8.0 (*)    Glucose, Bld 131 (*)    Calcium, Ion 0.97 (*)    All other components within normal limits  I-STAT VENOUS BLOOD GAS, ED - Abnormal; Notable for the following components:   pH, Ven 7.452 (*)    pCO2, Ven 39.3 (*)    pO2, Ven 57 (*)    Acid-Base Excess 3.0 (*)    Sodium 131 (*)    Potassium 5.5 (*)     Calcium, Ion 1.04 (*)    All other components within normal limits  I-STAT CG4 LACTIC ACID, ED - Abnormal; Notable for the following components:   Lactic Acid, Venous 2.2 (*)    All other components within normal limits  RESP PANEL BY RT-PCR (RSV, FLU A&B, COVID)  RVPGX2  CULTURE, BLOOD (ROUTINE X 2)  CULTURE, BLOOD (ROUTINE X 2)  CBC WITH DIFFERENTIAL/PLATELET  BRAIN NATRIURETIC PEPTIDE  PROTIME-INR  APTT  URINALYSIS, W/ REFLEX TO CULTURE (INFECTION SUSPECTED)  I-STAT CG4 LACTIC ACID, ED    EKG EKG Interpretation Date/Time:  Sunday December 25 2023 21:37:27 EDT Ventricular Rate:  117 PR Interval:  155 QRS Duration:  88 QT Interval:  317 QTC Calculation: 443 R Axis:   66  Text Interpretation: Sinus tachycardia Consider left atrial enlargement Borderline T abnormalities, lateral leads Confirmed by Vivi Barrack [phone]) on 12/25/2023 9:52:11 PM  Radiology DG Chest Port 1 View Result Date: 12/25/2023 CLINICAL DATA:  Respiratory distress. EXAM: PORTABLE CHEST 1 VIEW COMPARISON:  08/10/2023 FINDINGS: The heart is upper normal in size. Stable mediastinal contours. Vascular congestion. Ill-defined opacity at the lung bases, favor atelectasis. No significant pleural effusion. No pneumothorax. No acute osseous findings. IMPRESSION: 1. Vascular congestion. 2. Ill-defined opacity at the lung bases, favor atelectasis. Electronically Signed   By: Narda Rutherford M.D.   On: 12/25/2023 22:12    Procedures .Critical Care  Performed by: Loetta Rough, MD Authorized by: Loetta Rough, MD   Critical care provider statement:    Critical care time (minutes):  40   Critical care was necessary to treat or prevent imminent or life-threatening deterioration of the following conditions:  Respiratory failure   Critical care was time spent personally by me on the following activities:  Development of treatment plan with patient or surrogate, discussions with consultants, evaluation of patient's response  to treatment, examination of patient, ordering and review of laboratory studies, ordering and review of radiographic studies, ordering and performing treatments and interventions, pulse oximetry, re-evaluation of patient's condition, review of old charts and obtaining history from patient or surrogate   Care discussed with: admitting provider       Medications Ordered in ED Medications  ipratropium-albuterol (DUONEB) 0.5-2.5 (3) MG/3ML nebulizer solution 3 mL (3 mLs Nebulization Given 12/25/23 2246)  lactated ringers infusion (has no administration in time range)  lactated ringers bolus 500 mL (has no administration in time range)  azithromycin (ZITHROMAX) 500 mg in sodium chloride 0.9 % 250 mL IVPB (has no administration in time range)  cefTRIAXone (ROCEPHIN) 2 g in sodium chloride 0.9 % 100 mL IVPB (2 g Intravenous New Bag/Given 12/25/23 2240)  methylPREDNISolone sodium succinate (SOLU-MEDROL) 125 mg/2 mL injection 125 mg (125 mg Intravenous Given 12/25/23 2202)  magnesium sulfate IVPB 2 g 50 mL (0 g Intravenous Stopped 12/25/23 2230)    ED Course/ Medical Decision Making/ A&P                          Medical Decision Making Amount and/or Complexity of Data Reviewed Labs: ordered. Decision-making details documented in ED Course. Radiology: ordered. Decision-making details documented in ED Course.  Risk Prescription drug management.    This patient presents to the ED for concern of resp distress, this involves an extensive number of treatment options, and is a complaint that carries with it a high risk of complications and morbidity.  I considered the following differential and admission for this acute, potentially life threatening condition.   MDM:    DDX for dyspnea includes but is not limited to:  Likely COPD exacerbation given improvement w/ albuterol and wheezing. Patient is following commands and responds to voice so placed immediately on BIPAP on arrival. Given 3 duonebs back to  back, solumedrol IV, and Mg IV. VBG shows no hypercarbia. CXR w/ no focal consolidation. No CP to indicate ACS or PE.    Clinical Course as of 12/25/23 2332  Wynelle Link Dec 25, 2023  2151 pCO2, Ven(!): 39.3 No hypercarbia [HN]  2151 Potassium(!!): 8.0 Was just noted to be 5.5 on VBG. Not likely 8.0. Will await CMP but tend to  believe 5.5. No peaked Ts on EKG. [HN]  2151 Potassium(!): 5.5 [HN]  2217 Temp(!): 100.9 F (38.3 C) +febrile, no white count but tachypnea/tachycardia, +SIRS [HN]  2219 Has allergy listed to penicillins of swelling but per chart review has tolerated ceftriaxone [HN]  2219 CBC with Differential/Platelet wnl [HN]  2237 Patient's respiratory status much improved after duonebs/Mg/solumedrol and BiPAP. She has nearly no wheezing at this point and reports she feels improved.  [HN]  2237 Comprehensive metabolic panel(!) Unremarkable in the context of this patient's presentation  [HN]  2237 DG Chest [facility] 1 View 1. Vascular congestion. 2. Ill-defined opacity at the lung bases, favor atelectasis.   [HN]  2246 B Natriuretic Peptide: 94.2 wnl [HN]  2331 Influenza A By PCR(!): POSITIVE Influenza A positive [HN]    Clinical Course User Index [HN] Loetta Rough, MD    Labs: I Ordered, and personally interpreted labs.  The pertinent results include:  those listed above  Imaging Studies ordered: I ordered imaging studies including CXR I independently visualized and interpreted imaging. I agree with the radiologist interpretation  Additional history obtained from EMS, chart review  Cardiac Monitoring: .The patient was maintained on a cardiac monitor.  I personally viewed and interpreted the cardiac monitored which showed an underlying rhythm of: sinus tachycardia  Reevaluation: After the interventions noted above, I reevaluated the patient and found that they have :improved  Social Determinants of Health: .Lives at ALF  Disposition:  Patient is signed out to the  oncoming ED physician Dr. Eudelia Bunch who is made aware of her history, presentation, exam, workup, and plan. Plan will be to complete remainder of duonebs and to trial off of BiPAP to determine disposition.   Co morbidities that complicate the patient evaluation . Past Medical History:  Diagnosis Date  . Asthma   . Bipolar affective disorder, currently depressed, mild (HCC)   . Chest pain   . Complication of anesthesia    " My eyes swell up "  . Coronary artery disease   . Depression   . Fall 08/25/2012   Recent fall with residual knee and back pain.  . Family history of anesthesia complication    " [name] "  . GERD (gastroesophageal reflux disease)   . Hypertension   . Memory disturbance 01/30/2016  . Stroke (HCC)   . Syncope and collapse 03/21/2019  . Tardive dyskinesia   . Tobacco abuse      Medicines Meds ordered this encounter  Medications  . methylPREDNISolone sodium succinate (SOLU-MEDROL) 125 mg/2 mL injection 125 mg  . magnesium sulfate IVPB 2 g 50 mL  . ipratropium-albuterol (DUONEB) 0.5-2.5 (3) MG/3ML nebulizer solution 3 mL  . lactated ringers infusion  . lactated ringers bolus 500 mL    Reason 30 mL/kg dose is not being ordered:   First Lactic Acid Pending  . DISCONTD: cefTRIAXone (ROCEPHIN) 1 g in sodium chloride 0.9 % 100 mL IVPB    Antibiotic Indication::   CAP  . azithromycin (ZITHROMAX) 500 mg in sodium chloride 0.9 % 250 mL IVPB    Antibiotic Indication::   CAP  . cefTRIAXone (ROCEPHIN) 2 g in sodium chloride 0.9 % 100 mL IVPB    Antibiotic Indication::   CAP    I have reviewed the patients home medicines and have made adjustments as needed  Problem List / ED Course: Problem List Items Addressed This Visit       Respiratory   COPD exacerbation (HCC)   Relevant Medications  azithromycin (ZITHROMAX) 500 mg in sodium chloride 0.9 % 250 mL IVPB   Other Visit Diagnoses       Acute hypoxic respiratory failure (HCC)    -  Primary                    This note was created using dictation software, which may contain spelling or grammatical errors.    Loetta Rough, MD 12/25/23 [id number]

## 2023-12-26 ENCOUNTER — Encounter (HOSPITAL_COMMUNITY): Payer: Self-pay | Admitting: Family Medicine

## 2023-12-26 ENCOUNTER — Inpatient Hospital Stay (HOSPITAL_COMMUNITY)

## 2023-12-26 DIAGNOSIS — J9601 Acute respiratory failure with hypoxia: Secondary | ICD-10-CM | POA: Diagnosis present

## 2023-12-26 DIAGNOSIS — Z888 Allergy status to other drugs, medicaments and biological substances status: Secondary | ICD-10-CM | POA: Diagnosis not present

## 2023-12-26 DIAGNOSIS — R0603 Acute respiratory distress: Secondary | ICD-10-CM | POA: Diagnosis present

## 2023-12-26 DIAGNOSIS — Z7951 Long term (current) use of inhaled steroids: Secondary | ICD-10-CM | POA: Diagnosis not present

## 2023-12-26 DIAGNOSIS — J101 Influenza due to other identified influenza virus with other respiratory manifestations: Secondary | ICD-10-CM | POA: Diagnosis present

## 2023-12-26 DIAGNOSIS — Z823 Family history of stroke: Secondary | ICD-10-CM | POA: Diagnosis not present

## 2023-12-26 DIAGNOSIS — Z8673 Personal history of transient ischemic attack (TIA), and cerebral infarction without residual deficits: Secondary | ICD-10-CM | POA: Diagnosis not present

## 2023-12-26 DIAGNOSIS — Z882 Allergy status to sulfonamides status: Secondary | ICD-10-CM | POA: Diagnosis not present

## 2023-12-26 DIAGNOSIS — Z79899 Other long term (current) drug therapy: Secondary | ICD-10-CM | POA: Diagnosis not present

## 2023-12-26 DIAGNOSIS — A4189 Other specified sepsis: Secondary | ICD-10-CM | POA: Diagnosis present

## 2023-12-26 DIAGNOSIS — Z881 Allergy status to other antibiotic agents status: Secondary | ICD-10-CM | POA: Diagnosis not present

## 2023-12-26 DIAGNOSIS — I2510 Atherosclerotic heart disease of native coronary artery without angina pectoris: Secondary | ICD-10-CM | POA: Diagnosis present

## 2023-12-26 DIAGNOSIS — J9811 Atelectasis: Secondary | ICD-10-CM | POA: Diagnosis present

## 2023-12-26 DIAGNOSIS — Z880 Allergy status to penicillin: Secondary | ICD-10-CM | POA: Diagnosis not present

## 2023-12-26 DIAGNOSIS — F17210 Nicotine dependence, cigarettes, uncomplicated: Secondary | ICD-10-CM | POA: Diagnosis present

## 2023-12-26 DIAGNOSIS — I10 Essential (primary) hypertension: Secondary | ICD-10-CM | POA: Diagnosis present

## 2023-12-26 DIAGNOSIS — R413 Other amnesia: Secondary | ICD-10-CM | POA: Diagnosis present

## 2023-12-26 DIAGNOSIS — E872 Acidosis, unspecified: Secondary | ICD-10-CM | POA: Diagnosis present

## 2023-12-26 DIAGNOSIS — J441 Chronic obstructive pulmonary disease with (acute) exacerbation: Secondary | ICD-10-CM | POA: Diagnosis present

## 2023-12-26 DIAGNOSIS — Z8249 Family history of ischemic heart disease and other diseases of the circulatory system: Secondary | ICD-10-CM | POA: Diagnosis not present

## 2023-12-26 DIAGNOSIS — Z90710 Acquired absence of both cervix and uterus: Secondary | ICD-10-CM | POA: Diagnosis not present

## 2023-12-26 DIAGNOSIS — Z1152 Encounter for screening for COVID-19: Secondary | ICD-10-CM | POA: Diagnosis not present

## 2023-12-26 DIAGNOSIS — F200 Paranoid schizophrenia: Secondary | ICD-10-CM | POA: Diagnosis present

## 2023-12-26 LAB — BASIC METABOLIC PANEL
Anion gap: 15 (ref 5–15)
BUN: 10 mg/dL (ref 8–23)
CO2: 17 mmol/L — ABNORMAL LOW (ref 22–32)
Calcium: 8.6 mg/dL — ABNORMAL LOW (ref 8.9–10.3)
Chloride: 104 mmol/L (ref 98–111)
Creatinine, Ser: 1.17 mg/dL — ABNORMAL HIGH (ref 0.44–1.00)
GFR, Estimated: 50 mL/min — ABNORMAL LOW (ref >60)
Glucose, Bld: 179 mg/dL — ABNORMAL HIGH (ref 70–99)
Potassium: 3.4 mmol/L — ABNORMAL LOW (ref 3.5–5.1)
Sodium: 136 mmol/L (ref 135–145)

## 2023-12-26 LAB — CBC
HCT: 37.7 % (ref 36.0–46.0)
Hemoglobin: 12.4 g/dL (ref 12.0–15.0)
MCH: 30.7 pg (ref 26.0–34.0)
MCHC: 32.9 g/dL (ref 30.0–36.0)
MCV: 93.3 fL (ref 80.0–100.0)
Platelets: 219 10*3/uL (ref 150–400)
RBC: 4.04 MIL/uL (ref 3.87–5.11)
RDW: 13.9 % (ref 11.5–15.5)
WBC: 9 10*3/uL (ref 4.0–10.5)
nRBC: 0 % (ref 0.0–0.2)

## 2023-12-26 LAB — URINALYSIS, W/ REFLEX TO CULTURE (INFECTION SUSPECTED)
Bacteria, UA: NONE SEEN
Bilirubin Urine: NEGATIVE
Glucose, UA: NEGATIVE mg/dL
Hgb urine dipstick: NEGATIVE
Ketones, ur: NEGATIVE mg/dL
Nitrite: NEGATIVE
Protein, ur: NEGATIVE mg/dL
Specific Gravity, Urine: 1.011 (ref 1.005–1.030)
pH: 5 (ref 5.0–8.0)

## 2023-12-26 LAB — LACTIC ACID, PLASMA
Lactic Acid, Venous: 3.7 mmol/L (ref 0.5–1.9)
Lactic Acid, Venous: 7.3 mmol/L (ref 0.5–1.9)
Lactic Acid, Venous: 4.7 mmol/L (ref 0.5–1.9)

## 2023-12-26 LAB — PROCALCITONIN: Procalcitonin: <0.1 ng/mL

## 2023-12-26 MED ORDER — SODIUM CHLORIDE 0.9 % IV BOLUS
1000.0000 mL | Freq: Once | INTRAVENOUS | Status: AC
  Administered 2023-12-26: 1000 mL via INTRAVENOUS

## 2023-12-26 MED ORDER — METOPROLOL TARTRATE 12.5 MG HALF TABLET
12.5000 mg | ORAL_TABLET | Freq: Two times a day (BID) | ORAL | Status: DC
  Administered 2023-12-26 – 2023-12-29 (×7): 12.5 mg via ORAL
  Filled 2023-12-26 (×7): qty 1

## 2023-12-26 MED ORDER — QUETIAPINE FUMARATE 100 MG PO TABS
200.0000 mg | ORAL_TABLET | Freq: Every day | ORAL | Status: DC
  Administered 2023-12-26 – 2023-12-28 (×3): 200 mg via ORAL
  Filled 2023-12-26 (×3): qty 2

## 2023-12-26 MED ORDER — LACTATED RINGERS IV SOLN: INTRAVENOUS | Status: AC

## 2023-12-26 MED ORDER — OSELTAMIVIR PHOSPHATE 75 MG PO CAPS: 75.0000 mg | ORAL_CAPSULE | Freq: Once | ORAL | Status: DC

## 2023-12-26 MED ORDER — ONDANSETRON HCL 4 MG PO TABS: 4.0000 mg | ORAL_TABLET | Freq: Four times a day (QID) | ORAL | Status: DC | PRN

## 2023-12-26 MED ORDER — IPRATROPIUM-ALBUTEROL 0.5-2.5 (3) MG/3ML IN SOLN
3.0000 mL | RESPIRATORY_TRACT | Status: DC | PRN
  Administered 2023-12-26 – 2023-12-27 (×2): 3 mL via RESPIRATORY_TRACT
  Filled 2023-12-26 (×2): qty 3

## 2023-12-26 MED ORDER — IPRATROPIUM-ALBUTEROL 0.5-2.5 (3) MG/3ML IN SOLN
3.0000 mL | Freq: Four times a day (QID) | RESPIRATORY_TRACT | Status: DC
  Administered 2023-12-26 – 2023-12-28 (×8): 3 mL via RESPIRATORY_TRACT
  Filled 2023-12-26 (×7): qty 3
  Filled 2023-12-26: qty 6
  Filled 2023-12-26: qty 3

## 2023-12-26 MED ORDER — SODIUM CHLORIDE 0.9% FLUSH
3.0000 mL | Freq: Two times a day (BID) | INTRAVENOUS | Status: DC
  Administered 2023-12-26 – 2023-12-28 (×7): 3 mL via INTRAVENOUS

## 2023-12-26 MED ORDER — HYDROXYZINE PAMOATE 25 MG PO CAPS: 25.0000 mg | ORAL_CAPSULE | Freq: Two times a day (BID) | ORAL | Status: DC | PRN

## 2023-12-26 MED ORDER — ACETAMINOPHEN 650 MG RE SUPP: 650.0000 mg | Freq: Four times a day (QID) | RECTAL | Status: DC | PRN

## 2023-12-26 MED ORDER — PREGABALIN 75 MG PO CAPS
75.0000 mg | ORAL_CAPSULE | Freq: Three times a day (TID) | ORAL | Status: DC
Start: 2023-12-26 — End: 2023-12-30
  Administered 2023-12-26 – 2023-12-29 (×11): 75 mg via ORAL
  Filled 2023-12-26: qty 1
  Filled 2023-12-26: qty 3
  Filled 2023-12-26 (×2): qty 1
  Filled 2023-12-26 (×2): qty 3
  Filled 2023-12-26 (×3): qty 1
  Filled 2023-12-26: qty 3
  Filled 2023-12-26: qty 1

## 2023-12-26 MED ORDER — PREDNISONE 20 MG PO TABS: 40.0000 mg | ORAL_TABLET | Freq: Every day | ORAL | Status: DC

## 2023-12-26 MED ORDER — IOHEXOL 350 MG/ML SOLN
75.0000 mL | Freq: Once | INTRAVENOUS | Status: AC | PRN
  Administered 2023-12-26: 75 mL via INTRAVENOUS

## 2023-12-26 MED ORDER — ARFORMOTEROL TARTRATE 15 MCG/2ML IN NEBU
15.0000 ug | INHALATION_SOLUTION | Freq: Two times a day (BID) | RESPIRATORY_TRACT | Status: DC
  Administered 2023-12-26 – 2023-12-29 (×6): 15 ug via RESPIRATORY_TRACT
  Filled 2023-12-26 (×6): qty 2

## 2023-12-26 MED ORDER — LACTATED RINGERS IV SOLN: INTRAVENOUS | Status: DC

## 2023-12-26 MED ORDER — METHYLPREDNISOLONE SODIUM SUCC 125 MG IJ SOLR: 125.0000 mg | Freq: Two times a day (BID) | INTRAMUSCULAR | Status: DC

## 2023-12-26 MED ORDER — BUDESONIDE 0.25 MG/2ML IN SUSP
0.2500 mg | Freq: Two times a day (BID) | RESPIRATORY_TRACT | Status: DC
  Administered 2023-12-26 – 2023-12-29 (×6): 0.25 mg via RESPIRATORY_TRACT
  Filled 2023-12-26 (×6): qty 2

## 2023-12-26 MED ORDER — SENNOSIDES-DOCUSATE SODIUM 8.6-50 MG PO TABS: 1.0000 | ORAL_TABLET | Freq: Every evening | ORAL | Status: DC | PRN

## 2023-12-26 MED ORDER — LACTATED RINGERS IV BOLUS: 500.0000 mL | Freq: Once | INTRAVENOUS | Status: DC

## 2023-12-26 MED ORDER — ATORVASTATIN CALCIUM 10 MG PO TABS
10.0000 mg | ORAL_TABLET | Freq: Every day | ORAL | Status: DC
  Administered 2023-12-26 – 2023-12-29 (×4): 10 mg via ORAL
  Filled 2023-12-26 (×4): qty 1

## 2023-12-26 MED ORDER — DEXTROSE 5 % IV SOLN
250.0000 mg | INTRAVENOUS | Status: DC
  Administered 2023-12-27 – 2023-12-29 (×3): 250 mg via INTRAVENOUS
  Filled 2023-12-26 (×4): qty 2.5

## 2023-12-26 MED ORDER — VALBENAZINE TOSYLATE 40 MG PO CAPS
80.0000 mg | ORAL_CAPSULE | Freq: Every day | ORAL | Status: DC
  Administered 2023-12-26 – 2023-12-29 (×3): 80 mg via ORAL
  Filled 2023-12-26 (×4): qty 2

## 2023-12-26 MED ORDER — AMLODIPINE BESYLATE 5 MG PO TABS: 5.0000 mg | ORAL_TABLET | Freq: Every day | ORAL | Status: DC

## 2023-12-26 MED ORDER — METOPROLOL TARTRATE 25 MG PO TABS
25.0000 mg | ORAL_TABLET | Freq: Two times a day (BID) | ORAL | Status: DC
Start: 2023-12-26 — End: 2023-12-26

## 2023-12-26 MED ORDER — POTASSIUM CHLORIDE CRYS ER 20 MEQ PO TBCR
40.0000 meq | EXTENDED_RELEASE_TABLET | Freq: Once | ORAL | Status: AC
  Administered 2023-12-26: 40 meq via ORAL
  Filled 2023-12-26: qty 2

## 2023-12-26 MED ORDER — OSELTAMIVIR PHOSPHATE 30 MG PO CAPS
30.0000 mg | ORAL_CAPSULE | Freq: Two times a day (BID) | ORAL | Status: DC
  Administered 2023-12-26 – 2023-12-29 (×6): 30 mg via ORAL
  Filled 2023-12-26 (×11): qty 1

## 2023-12-26 MED ORDER — LACTATED RINGERS IV BOLUS
500.0000 mL | Freq: Once | INTRAVENOUS | Status: AC
  Administered 2023-12-26: 500 mL via INTRAVENOUS

## 2023-12-26 MED ORDER — SODIUM CHLORIDE 0.9 % IV SOLN
1.0000 g | INTRAVENOUS | Status: DC
  Administered 2023-12-26 – 2023-12-28 (×3): 1 g via INTRAVENOUS
  Filled 2023-12-26 (×3): qty 10

## 2023-12-26 MED ORDER — ACETAMINOPHEN 325 MG PO TABS
650.0000 mg | ORAL_TABLET | Freq: Four times a day (QID) | ORAL | Status: DC | PRN
  Administered 2023-12-26 – 2023-12-28 (×3): 650 mg via ORAL
  Filled 2023-12-26 (×3): qty 2

## 2023-12-26 MED ORDER — OXYCODONE HCL 5 MG PO TABS
5.0000 mg | ORAL_TABLET | ORAL | Status: DC | PRN
  Administered 2023-12-26 – 2023-12-27 (×4): 5 mg via ORAL
  Filled 2023-12-26 (×4): qty 1

## 2023-12-26 MED ORDER — SERTRALINE HCL 100 MG PO TABS
100.0000 mg | ORAL_TABLET | Freq: Every day | ORAL | Status: DC
  Administered 2023-12-26 – 2023-12-29 (×4): 100 mg via ORAL
  Filled 2023-12-26 (×4): qty 1

## 2023-12-26 MED ORDER — GUAIFENESIN 100 MG/5ML PO LIQD
5.0000 mL | ORAL | Status: DC | PRN
  Administered 2023-12-29: 5 mL via ORAL
  Filled 2023-12-26: qty 5

## 2023-12-26 MED ORDER — ENOXAPARIN SODIUM 40 MG/0.4ML IJ SOSY
40.0000 mg | PREFILLED_SYRINGE | INTRAMUSCULAR | Status: DC
  Administered 2023-12-26 – 2023-12-29 (×4): 40 mg via SUBCUTANEOUS
  Filled 2023-12-26 (×5): qty 0.4

## 2023-12-26 MED ORDER — PREDNISONE 20 MG PO TABS
40.0000 mg | ORAL_TABLET | Freq: Every day | ORAL | Status: DC
  Administered 2023-12-26: 40 mg via ORAL
  Filled 2023-12-26: qty 2

## 2023-12-26 MED ORDER — SODIUM CHLORIDE 0.9 % IV BOLUS
500.0000 mL | Freq: Once | INTRAVENOUS | Status: AC
  Administered 2023-12-26: 500 mL via INTRAVENOUS

## 2023-12-26 MED ORDER — METHYLPREDNISOLONE SODIUM SUCC 40 MG IJ SOLR
40.0000 mg | Freq: Two times a day (BID) | INTRAMUSCULAR | Status: DC
  Administered 2023-12-26 – 2023-12-29 (×7): 40 mg via INTRAVENOUS
  Filled 2023-12-26 (×7): qty 1

## 2023-12-26 MED ORDER — SODIUM CHLORIDE 0.9 % IV SOLN: INTRAVENOUS | Status: DC

## 2023-12-26 MED ORDER — ONDANSETRON HCL 4 MG/2ML IJ SOLN: 4.0000 mg | Freq: Four times a day (QID) | INTRAMUSCULAR | Status: DC | PRN

## 2023-12-26 NOTE — Sepsis Progress Note (Signed)
 Elink following for sepsis protocol.

## 2023-12-26 NOTE — Progress Notes (Signed)
 PROGRESS NOTE    Danielle [name]  ZOX:[id number] DOB: 1951/02/25 DOA: 12/25/2023 PCP: [facility]   Brief Narrative: 73 year old with past medical history significant for hypertension, COPD, CAD, memory disturbance, schizoaffective disorder, depression, anxiety, presents with acute respiratory failure, patient developed shortness of breath the day prior to admission.  EMS was contacted at her ALF and she was found to be in acute distress oxygen saturation 70% on room air.  She received albuterol Atrovent and supplemental oxygen.  She was placed on BiPAP in the ED.  Chest x-ray showed vascular congestion and ill-defined basilar opacity, favored to reflect atelectasis.  BNP 94 lactic acid 2.2 influenza A PCR positive.   Assessment & Plan:   Principal Problem:   COPD with acute exacerbation (HCC) Active Problems:   Schizophrenia, paranoid type (HCC)   HYPERTENSION, BENIGN ESSENTIAL   Memory disturbance   CAD (coronary artery disease)   Acute respiratory failure with hypoxia (HCC)   Influenza A   1-Acute hypoxic respiratory failure, in the setting of influenza A and COPD exacerbation -Presented with shortness of breath, found to be hypoxic oxygen sat 70% on room air.  Placed on BiPAP on admission -Continue Tamiflu -Continue IV ceftriaxone and Azithro -Continue with IV Solu-Medrol  -Scheduled nebulizers  2-Lactic acidosis -2.2----3.7---7.3--4 -Received IV fluids 1.5 L in the ED -Follow Blood culture.  -Follow trends -CT abdomen Pelvis: No acute CT finding in the abdomen and pelvis.  Mildly enlarged steatotic liver.  Diverticulosis no diverticulitis.  Thickening of the fold of the stomach less thickened than previously. On IV antibiotics.   CAD: -Continue with Lipitor.  On metoprolol.   Hypertension: -Hold Norvasc, systolic blood pressure soft. -Holder  parameters for metoprolol  Schizoaffective disorder, depression, anxiety -Continue with Seroquel and Zoloft,  Lyrica  Memory loss -Delirium precaution.  -Check B 12   Estimated body mass index is 27.65 kg/m as calculated from the following:   Height as of 08/10/23: 5\' 3"  (1.6 m).   Weight as of this encounter: 70.8 kg.   DVT prophylaxis: Lovenox Code Status: Full code Family Communication: Daughter 12-26-2023 Disposition Plan:  Status is: Inpatient Remains inpatient appropriate because: management of Influenza    Consultants:  None  Procedures:    Antimicrobials:    Subjective: She is alert, ill appearing , feels weak, report cough, SOB  Objective: Vitals:   12/26/23 0425 12/26/23 0500 12/26/23 0600 12/26/23 0645  BP:  (!) 98/59 (!) 94/57   Pulse:  (!) 112 (!) 104 (!) 101  Resp:  17 17 17   Temp: 98.4 F (36.9 C)     TempSrc: Oral     SpO2:  95% 94% 95%  Weight:        Intake/Output Summary (Last 24 hours) at 12/26/2023 0725 Last data filed at 12/26/2023 8119 Gross per 24 hour  Intake 2362.14 ml  Output --  Net 2362.14 ml   Filed Weights   12/25/23 2200  Weight: 70.8 kg    Examination:  General exam: Appears calm and comfortable  Respiratory system: BL wheezing.  Respiratory effort normal. Cardiovascular system: S1 & S2 heard, RRR. Gastrointestinal system: Abdomen is nondistended, soft and nontender. No organomegaly or masses felt. Normal bowel sounds heard. Central nervous system: Alert and oriented. Extremities: no edema    Data Reviewed: I have personally reviewed following labs and imaging studies  CBC: Recent Labs  Lab 12/25/23 2145 12/25/23 2149 12/25/23 2158 12/26/23 0430  WBC  --   --  9.0 9.0  NEUTROABS  --   --  7.1  --   HGB 14.3 14.6 13.1 12.4  HCT 42.0 43.0 39.9 37.7  MCV  --   --  92.6 93.3  PLT  --   --  224 219   Basic Metabolic Panel: Recent Labs  Lab 12/25/23 2145 12/25/23 2149 12/25/23 2158 12/26/23 0430  NA 131* 128* 135 136  K 5.5* 8.0* 4.2 3.4*  CL  --  103 96* 104  CO2  --   --  24 17*  GLUCOSE  --  131* 137*  179*  BUN  --  15 12 10   CREATININE  --  0.90 1.07* 1.17*  CALCIUM  --   --  9.6 8.6*   GFR: Estimated Creatinine Clearance: 42.2 mL/min (A) (by C-G formula based on SCr of 1.17 mg/dL (H)). Liver Function Tests: Recent Labs  Lab 12/25/23 2158  AST 26  ALT 13  ALKPHOS 61  BILITOT 0.7  PROT 6.9  ALBUMIN 3.9   No results for input(s): "LIPASE", "AMYLASE" in the last 168 hours. No results for input(s): "AMMONIA" in the last 168 hours. Coagulation Profile: Recent Labs  Lab 12/25/23 2219  INR 1.1   Cardiac Enzymes: No results for input(s): "CKTOTAL", "CKMB", "CKMBINDEX", "TROPONINI" in the last 168 hours. BNP (last 3 results) No results for input(s): "PROBNP" in the last 8760 hours. HbA1C: No results for input(s): "HGBA1C" in the last 72 hours. CBG: No results for input(s): "GLUCAP" in the last 168 hours. Lipid Profile: No results for input(s): "CHOL", "HDL", "LDLCALC", "TRIG", "CHOLHDL", "LDLDIRECT" in the last 72 hours. Thyroid Function Tests: No results for input(s): "TSH", "T4TOTAL", "FREET4", "T3FREE", "THYROIDAB" in the last 72 hours. Anemia Panel: No results for input(s): "VITAMINB12", "FOLATE", "FERRITIN", "TIBC", "IRON", "RETICCTPCT" in the last 72 hours. Sepsis Labs: Recent Labs  Lab 12/25/23 2156 12/26/23 0037 12/26/23 0430  PROCALCITON  --  <0.10  --   LATICACIDVEN 2.2* 3.7* 7.3*    Recent Results (from the past 240 hours)  Resp panel by RT-PCR (RSV, Flu A&B, Covid) Anterior Nasal Swab     Status: Abnormal   Collection Time: 12/25/23 10:04 PM   Specimen: Anterior Nasal Swab  Result Value Ref Range Status   SARS Coronavirus 2 by RT PCR NEGATIVE NEGATIVE Final   Influenza A by PCR POSITIVE (A) NEGATIVE Final   Influenza B by PCR NEGATIVE NEGATIVE Final    Comment: (NOTE) The Xpert Xpress SARS-CoV-2/FLU/RSV plus assay is intended as an aid in the diagnosis of influenza from Nasopharyngeal swab specimens and should not be used as a sole basis for  treatment. Nasal washings and aspirates are unacceptable for Xpert Xpress SARS-CoV-2/FLU/RSV testing.  Fact Sheet for Patients: BloggerCourse.com  Fact Sheet for Healthcare Providers: SeriousBroker.it  This test is not yet approved or cleared by the [place] FDA and has been authorized for detection and/or diagnosis of SARS-CoV-2 by FDA under an Emergency Use Authorization (EUA). This EUA will remain in effect (meaning this test can be used) for the duration of the COVID-19 declaration under Section 564(b)(1) of the Act, 21 U.S.C. section 360bbb-3(b)(1), unless the authorization is terminated or revoked.     Resp Syncytial Virus by PCR NEGATIVE NEGATIVE Final    Comment: (NOTE) Fact Sheet for Patients: BloggerCourse.com  Fact Sheet for Healthcare Providers: SeriousBroker.it  This test is not yet approved or cleared by the [place] FDA and has been authorized for detection and/or diagnosis of SARS-CoV-2 by FDA under an Emergency Use Authorization (EUA). This EUA will remain in  effect (meaning this test can be used) for the duration of the COVID-19 declaration under Section 564(b)(1) of the Act, 21 U.S.C. section 360bbb-3(b)(1), unless the authorization is terminated or revoked.  Performed at [facility], [address] [place], [place] [address]          Radiology Studies: CT ABDOMEN PELVIS W CONTRAST Result Date: 12/26/2023 CLINICAL DATA:  Sepsis and respiratory distress. EXAM: CT ABDOMEN AND PELVIS WITH CONTRAST TECHNIQUE: Multidetector CT imaging of the abdomen and pelvis was performed using the standard protocol following bolus administration of intravenous contrast. RADIATION DOSE REDUCTION: This exam was performed according to the departmental dose-optimization program which includes automated exposure control, adjustment of the mA and/or kV according  to patient size and/or use of iterative reconstruction technique. CONTRAST:  75mL OMNIPAQUE IOHEXOL 350 MG/ML SOLN COMPARISON:  CT with IV contrast 05/03/2022. FINDINGS: Lower chest: Lung bases are clear. There is mild cardiomegaly. No pericardial effusion. There is a small hiatal hernia. Hepatobiliary: The liver is 20 cm length, mildly steatotic without mass enhancement. The gallbladder and bile ducts are unremarkable. Pancreas: No abnormality. Spleen: No abnormality.  No splenomegaly. Adrenals/Urinary Tract: Adrenal glands are unremarkable. Kidneys are normal, without renal calculi, focal lesion, or hydronephrosis. Bladder is unremarkable. Stomach/Bowel: There are thickened folds in the stomach antrum but less thickened than previously. The small bowel is normal caliber.  The appendix is normal. There is scattered colonic diverticulosis without evidence of diverticulitis or colitis. Vascular/Lymphatic: Aortic atherosclerosis. No enlarged abdominal or pelvic lymph nodes. Multiple pelvic phleboliths. Reproductive: Status post hysterectomy. No adnexal masses. Other: Mild rectus diastasis at the umbilicus is again noted and a small umbilical fat hernia. There is no incarcerated hernia. There is no free fluid, free hemorrhage or free air. Musculoskeletal: There are degenerative changes of the lumbar spine. No acute or significant osseous findings. Calcified injection granulomas both buttocks. IMPRESSION: 1. No acute CT findings in the abdomen or pelvis. 2. Small hiatal hernia. 3. Mildly enlarged steatotic liver. 4. Diverticulosis without evidence of diverticulitis. 5. Aortic atherosclerosis.  Mild cardiomegaly. 6. Thickening of the folds in the stomach antrum but less thickened than previously. 7. Small umbilical fat hernia and mild rectus diastasis at the umbilicus. Aortic Atherosclerosis (ICD10-I70.0). Electronically Signed   By: Almira Bar M.D.   On: 12/26/2023 05:48   DG Chest Port 1 View Result Date:  12/25/2023 CLINICAL DATA:  Respiratory distress. EXAM: PORTABLE CHEST 1 VIEW COMPARISON:  08/10/2023 FINDINGS: The heart is upper normal in size. Stable mediastinal contours. Vascular congestion. Ill-defined opacity at the lung bases, favor atelectasis. No significant pleural effusion. No pneumothorax. No acute osseous findings. IMPRESSION: 1. Vascular congestion. 2. Ill-defined opacity at the lung bases, favor atelectasis. Electronically Signed   By: Narda Rutherford M.D.   On: 12/25/2023 22:12        Scheduled Meds:  amLODipine  5 mg Oral Daily   atorvastatin  10 mg Oral Daily   enoxaparin (LOVENOX) injection  40 mg Subcutaneous Q24H   metoprolol tartrate  25 mg Oral BID   oseltamivir  30 mg Oral Q12H   predniSONE  40 mg Oral Q breakfast   pregabalin  75 mg Oral TID   QUEtiapine  200 mg Oral QHS   sertraline  100 mg Oral Daily   sodium chloride flush  3 mL Intravenous Q12H   valbenazine  80 mg Oral Daily   Continuous Infusions:  cefTRIAXone (ROCEPHIN)  IV     lactated ringers 75 mL/hr at  12/26/23 0038     LOS: 0 days    Time spent: 35 minutes    Channin Agustin A Aidee Latimore, MD [facility]   If 7PM-7AM, please contact night-coverage [email or web address]  12/26/2023, 7:25 AM

## 2023-12-26 NOTE — H&P (Addendum)
 History and Physical    Danielle [name] ONG:[id number] DOB: 27-Aug-1951 DOA: 12/25/2023  PCP: [facility]   Patient coming from: ALF   Chief Complaint: Respiratory failure   HPI: Danielle [name] is a 73 y.o. female with medical history significant for hypertension, COPD, CAD, memory disturbance, schizoaffective disorder, depression, and anxiety, now presenting with acute respiratory failure.  Patient reports that she began feeling short of breath yesterday.  She denies chest pain.  EMS was called to her ALF where she was found to be in acute distress with saturation of 70% on room air.  She was treated with albuterol, Atrovent, and supplemental oxygen prior to arrival in the ED.  ED Course: Upon arrival to the ED, patient is found to be febrile to 38.3 C and saturating well on BiPAP with tachypnea, tachycardia, and systolic blood pressure of 93 and greater.  EKG demonstrates sinus tachycardia and chest x-ray is notable for vascular congestion and ill-defined basilar opacities favored to reflect atelectasis.  Labs are most notable for creatinine 1.07, normal WBC, lactic acid 2.2, BNP 94, and positive influenza A PCR.  Blood cultures were collected in the ED, patient was placed on BiPAP, and she was treated with 500 mL of LR, DuoNeb, Rocephin, azithromycin, IV magnesium, and IV Solu-Medrol.  Review of Systems:  ROS limited by patient's clinical condition.  Past Medical History:  Diagnosis Date   Asthma    Bipolar affective disorder, currently depressed, mild (HCC)    Chest pain    Complication of anesthesia    " My eyes swell up "   Coronary artery disease    Depression    Fall 08/25/2012   Recent fall with residual knee and back pain.   Family history of anesthesia complication    " [name] "   GERD (gastroesophageal reflux disease)    Hypertension    Memory disturbance 01/30/2016   Stroke (HCC)    Syncope and collapse 03/21/2019   Tardive dyskinesia     Tobacco abuse     Past Surgical History:  Procedure Laterality Date   ABDOMINAL HYSTERECTOMY     BIOPSY  05/11/2022   Procedure: BIOPSY;  Surgeon: Lynann Bologna, MD;  Location: [facility] ENDOSCOPY;  Service: Gastroenterology;;   CARDIAC CATHETERIZATION     ESOPHAGOGASTRODUODENOSCOPY (EGD) WITH PROPOFOL N/A 05/11/2022   Procedure: ESOPHAGOGASTRODUODENOSCOPY (EGD) WITH PROPOFOL;  Surgeon: Lynann Bologna, MD;  Location: [facility] ENDOSCOPY;  Service: Gastroenterology;  Laterality: N/A;   HEMOSTASIS CONTROL  05/11/2022   Procedure: HEMOSTASIS CONTROL;  Surgeon: Lynann Bologna, MD;  Location: [facility] ENDOSCOPY;  Service: Gastroenterology;;   HOT HEMOSTASIS N/A 05/11/2022   Procedure: HOT HEMOSTASIS (ARGON PLASMA COAGULATION/BICAP);  Surgeon: Lynann Bologna, MD;  Location: [facility] ENDOSCOPY;  Service: Gastroenterology;  Laterality: N/A;   RIGHT/LEFT HEART CATH AND CORONARY ANGIOGRAPHY N/A 08/14/2018   Procedure: RIGHT/LEFT HEART CATH AND CORONARY ANGIOGRAPHY;  Surgeon: Marykay Lex, MD;  Location: [facility] INVASIVE CV LAB;  Service: Cardiovascular;  Laterality: N/A;    Social History:   reports that she has been smoking cigarettes. She has never used smokeless tobacco. She reports that she does not currently use alcohol. She reports that she does not currently use drugs after having used the following drugs: Cocaine and Marijuana.  Allergies  Allergen Reactions   Clonazepam Swelling and Other (See Comments)    Pt was recently given a triamterene hydrochlorothiazide combination as well as clonazepam and had an allergic reaction to one of them. Caused swelling of face and eyes  Doxycycline Swelling and Other (See Comments)    Swelling of face and eyes   Fosinopril Sodium Swelling and Other (See Comments)    Swelling face,lips-angioedema from ACE I   Hydrochlorothiazide W-Triamterene Swelling    Pt was recently given a triamterene hydrochlorothiazide combination as well as clonazepam and had an allergic reaction to one of  them. Swelling of face and eyes   Penicillins Swelling and Other (See Comments)    Face and eyes Has patient had a PCN reaction causing immediate rash, facial/tongue/throat swelling, SOB or lightheadedness with hypotension: Yes Has patient had a PCN reaction causing severe rash involving mucus membranes or skin necrosis: No Has patient had a PCN reaction that required hospitalization: No Has patient had a PCN reaction occurring within the last 10 years: No If all of the above answers are "NO", then may proceed with Cephalosporin use.    Sulfa Antibiotics Other (See Comments)    "Allergic," per [facility]   Triamterene Other (See Comments)    Pt was recently given a triamterene hydrochlorothiazide combination as well as clonazepam and had an allergic reaction to one of them. "Allergic," per [facility]    Family History  Problem Relation Age of Onset   Hypertension Mother    Stroke Mother    Heart attack Mother    Heart attack Father    Heart attack Sister    Heart attack Brother    Dementia Neg Hx    Colon cancer Neg Hx    Esophageal cancer Neg Hx    Stomach cancer Neg Hx      Prior to Admission medications   Medication Sig Start Date End Date Taking? Authorizing Provider  albuterol (PROVENTIL) (2.5 MG/3ML) 0.083% nebulizer solution Take 2.5 mg by nebulization every 6 (six) hours as needed for shortness of breath.    [provider]  albuterol (VENTOLIN HFA) 108 (90 Base) MCG/ACT inhaler Inhale 1-2 puffs into the lungs every 4 (four) hours as needed for wheezing or shortness of breath (or cough). 10/19/22   Glade Lloyd, MD  amLODipine (NORVASC) 5 MG tablet Take 5 mg by mouth daily.    [provider]  atorvastatin (LIPITOR) 10 MG tablet Take 10 mg by mouth daily.    [provider]  budesonide-formoterol (SYMBICORT) 160-4.5 MCG/ACT inhaler Inhale 2 puffs into the lungs 2 (two) times daily. 10/19/22   Glade Lloyd, MD  Cholecalciferol (VITAMIN D3) 250 MCG (10000 UT)  TABS Take 10,000 Units by mouth daily.    [provider]  DHEA 25 MG CAPS Take 25 mg by mouth in the morning.    [provider]  HYDROcodone-acetaminophen (NORCO) 10-325 MG tablet Take 1 tablet by mouth See admin instructions. Give 1 tablet by mouth 5 time a day as needed    [provider]  hydrOXYzine (VISTARIL) 25 MG capsule Take 25 mg by mouth 2 (two) times daily as needed for anxiety.    [provider]  latanoprost (XALATAN) 0.005 % ophthalmic solution Place 1 drop into both eyes at bedtime.    [provider]  loratadine (CLARITIN) 10 MG tablet Take 10 mg by mouth daily.    [provider]  metoprolol tartrate (LOPRESSOR) 25 MG tablet Take 1 tablet (25 mg total) by mouth 2 (two) times daily. 08/15/23   Marinda Elk, MD  pregabalin (LYRICA) 75 MG capsule Take 75 mg by mouth 3 (three) times daily. 04/07/22   [provider]  QUEtiapine (SEROQUEL) 200 MG tablet Take  200 mg by mouth at bedtime. 05/06/22   [provider]  sertraline (ZOLOFT) 100 MG tablet Take 100 mg by mouth daily.    [provider]  tiZANidine (ZANAFLEX) 2 MG tablet Take 2 mg by mouth every 8 (eight) hours as needed for muscle spasms.    [provider]  triamcinolone cream (KENALOG) 0.1 % Apply 1 g topically See admin instructions. Apply (1 grams) green pea-sized bead to popliteal fossae twice a day 04/27/22   [provider]  valbenazine (INGREZZA) 80 MG capsule Take 1 capsule (80 mg total) by mouth daily. 05/26/23       Physical Exam: Vitals:   12/25/23 2300 12/25/23 2315 12/25/23 2330 12/25/23 2359  BP: 98/67 112/74 106/71   Pulse: (!) 116 (!) 116 (!) 113 (!) 109  Resp: (!) 23 (!) 21 (!) 21 19  Temp:    98.3 F (36.8 C)  TempSrc:      SpO2: 100% 100% 100% 100%  Weight:        Constitutional: NAD, no pallor or diaphoresis   Eyes: PERTLA, lids and conjunctivae normal ENMT: Mucous membranes are moist. Posterior  pharynx clear of any exudate or lesions.   Neck: supple, no masses  Respiratory: Tachypneic, labored respirations. Diminished bilaterally with prolonged expiratory phase.  Cardiovascular: Rate ~120 and regular. No extremity edema.  Abdomen: No distension, no tenderness, soft. Bowel sounds active.  Musculoskeletal: no clubbing / cyanosis. No joint deformity upper and lower extremities.   Skin: no significant rashes, lesions, ulcers. Warm, dry, well-perfused. Neurologic: CN 2-12 grossly intact. Moving all extremities. Alert but only able to give 1-word answers d/t respiratory status.  Psychiatric: Calm. Cooperative.    Labs and Imaging on Admission: I have personally reviewed following labs and imaging studies  CBC: Recent Labs  Lab 12/25/23 2145 12/25/23 2149 12/25/23 2158  WBC  --   --  9.0  NEUTROABS  --   --  7.1  HGB 14.3 14.6 13.1  HCT 42.0 43.0 39.9  MCV  --   --  92.6  PLT  --   --  224   Basic Metabolic Panel: Recent Labs  Lab 12/25/23 2145 12/25/23 2149 12/25/23 2158  NA 131* 128* 135  K 5.5* 8.0* 4.2  CL  --  103 96*  CO2  --   --  24  GLUCOSE  --  131* 137*  BUN  --  15 12  CREATININE  --  0.90 1.07*  CALCIUM  --   --  9.6   GFR: Estimated Creatinine Clearance: 46.2 mL/min (A) (by C-G formula based on SCr of 1.07 mg/dL (H)). Liver Function Tests: Recent Labs  Lab 12/25/23 2158  AST 26  ALT 13  ALKPHOS 61  BILITOT 0.7  PROT 6.9  ALBUMIN 3.9   No results for input(s): "LIPASE", "AMYLASE" in the last 168 hours. No results for input(s): "AMMONIA" in the last 168 hours. Coagulation Profile: Recent Labs  Lab 12/25/23 2219  INR 1.1   Cardiac Enzymes: No results for input(s): "CKTOTAL", "CKMB", "CKMBINDEX", "TROPONINI" in the last 168 hours. BNP (last 3 results) No results for input(s): "PROBNP" in the last 8760 hours. HbA1C: No results for input(s): "HGBA1C" in the last 72 hours. CBG: No results for input(s): "GLUCAP" in the last 168  hours. Lipid Profile: No results for input(s): "CHOL", "HDL", "LDLCALC", "TRIG", "CHOLHDL", "LDLDIRECT" in the last 72 hours. Thyroid Function Tests: No results for input(s): "TSH", "T4TOTAL", "FREET4", "T3FREE", "THYROIDAB" in the last  72 hours. Anemia Panel: No results for input(s): "VITAMINB12", "FOLATE", "FERRITIN", "TIBC", "IRON", "RETICCTPCT" in the last 72 hours. Urine analysis:    Component Value Date/Time   COLORURINE YELLOW 05/03/2022 1534   APPEARANCEUR HAZY (A) 05/03/2022 1534   LABSPEC 1.030 05/03/2022 1534   PHURINE 5.0 05/03/2022 1534   GLUCOSEU NEGATIVE 05/03/2022 1534   HGBUR NEGATIVE 05/03/2022 1534   HGBUR negative 10/25/2008 0936   BILIRUBINUR NEGATIVE 05/03/2022 1534   KETONESUR NEGATIVE 05/03/2022 1534   PROTEINUR NEGATIVE 05/03/2022 1534   UROBILINOGEN 0.2 03/23/2018 1531   NITRITE NEGATIVE 05/03/2022 1534   LEUKOCYTESUR LARGE (A) 05/03/2022 1534   Sepsis Labs: @LABRCNTIP (procalcitonin:4,lacticidven:4) ) Recent Results (from the past 240 hours)  Resp panel by RT-PCR (RSV, Flu A&B, Covid) Anterior Nasal Swab     Status: Abnormal   Collection Time: 12/25/23 10:04 PM   Specimen: Anterior Nasal Swab  Result Value Ref Range Status   SARS Coronavirus 2 by RT PCR NEGATIVE NEGATIVE Final   Influenza A by PCR POSITIVE (A) NEGATIVE Final   Influenza B by PCR NEGATIVE NEGATIVE Final    Comment: (NOTE) The Xpert Xpress SARS-CoV-2/FLU/RSV plus assay is intended as an aid in the diagnosis of influenza from Nasopharyngeal swab specimens and should not be used as a sole basis for treatment. Nasal washings and aspirates are unacceptable for Xpert Xpress SARS-CoV-2/FLU/RSV testing.  Fact Sheet for Patients: BloggerCourse.com  Fact Sheet for Healthcare Providers: SeriousBroker.it  This test is not yet approved or cleared by the [place] FDA and has been authorized for detection and/or diagnosis of SARS-CoV-2  by FDA under an Emergency Use Authorization (EUA). This EUA will remain in effect (meaning this test can be used) for the duration of the COVID-19 declaration under Section 564(b)(1) of the Act, 21 U.S.C. section 360bbb-3(b)(1), unless the authorization is terminated or revoked.     Resp Syncytial Virus by PCR NEGATIVE NEGATIVE Final    Comment: (NOTE) Fact Sheet for Patients: BloggerCourse.com  Fact Sheet for Healthcare Providers: SeriousBroker.it  This test is not yet approved or cleared by the [place] FDA and has been authorized for detection and/or diagnosis of SARS-CoV-2 by FDA under an Emergency Use Authorization (EUA). This EUA will remain in effect (meaning this test can be used) for the duration of the COVID-19 declaration under Section 564(b)(1) of the Act, 21 U.S.C. section 360bbb-3(b)(1), unless the authorization is terminated or revoked.  Performed at [facility], [address], [place], [place] [address]      Radiological Exams on Admission: DG Chest Port 1 View Result Date: 12/25/2023 CLINICAL DATA:  Respiratory distress. EXAM: PORTABLE CHEST 1 VIEW COMPARISON:  08/10/2023 FINDINGS: The heart is upper normal in size. Stable mediastinal contours. Vascular congestion. Ill-defined opacity at the lung bases, favor atelectasis. No significant pleural effusion. No pneumothorax. No acute osseous findings. IMPRESSION: 1. Vascular congestion. 2. Ill-defined opacity at the lung bases, favor atelectasis. Electronically Signed   By: Narda Rutherford M.D.   On: 12/25/2023 22:12    EKG: Independently reviewed. Sinus tachycardia, rate 117.   Assessment/Plan   1. Influenza A with COPD exacerbation and acute hypoxic respiratory failure  - Culture sputum, start Tamiflu, continue systemic steroids, continue Rocephin, continue short-acting bronchodilators, continue BiPAP as-needed, droplet precautions   2. CAD  - No  angina  - Lipitor, metoprolol   3. Hypertension  - Norvasc, metoprolol    4. Schizoaffective disorder; depression; anxiety  - Continue home regimen    5. Memory loss  -  Delirium precautions     Addendum (04:58): Lactic acid has risen considerably while the patient has defervesced, HR has improved, and MAP has remained >65. Patient feels better than when she arrived. Plan to give additional fluid bolus, check CT abdomen/pelvis, and continue to trend lactate.       DVT prophylaxis: Lovenox  Code Status: Full  Level of Care: Level of care: Progressive Family Communication: None preesnt   Disposition Plan:  Patient is from: ALF  Anticipated d/c is to: TBD Anticipated d/c date is: 12/29/23  Patient currently: Pending improved respiratory status  Consults called: None  Admission status: Inpatient     Briscoe Deutscher, MD [facility]  12/26/2023, 12:11 AM

## 2023-12-26 NOTE — Progress Notes (Signed)
 Pt taken off BIPAP. No oxygen required at this time. VSS. Pt took a few sips of water and was able to speak well after her mouth was moistened. Will cont to monitor.

## 2023-12-27 DIAGNOSIS — J441 Chronic obstructive pulmonary disease with (acute) exacerbation: Secondary | ICD-10-CM | POA: Diagnosis not present

## 2023-12-27 DIAGNOSIS — J9601 Acute respiratory failure with hypoxia: Secondary | ICD-10-CM | POA: Diagnosis not present

## 2023-12-27 LAB — CBC
HCT: 35.2 % — ABNORMAL LOW (ref 36.0–46.0)
Hemoglobin: 12 g/dL (ref 12.0–15.0)
MCH: 30.2 pg (ref 26.0–34.0)
MCHC: 34.1 g/dL (ref 30.0–36.0)
MCV: 88.4 fL (ref 80.0–100.0)
Platelets: 188 10*3/uL (ref 150–400)
RBC: 3.98 MIL/uL (ref 3.87–5.11)
RDW: 14.3 % (ref 11.5–15.5)
WBC: 13.8 10*3/uL — ABNORMAL HIGH (ref 4.0–10.5)
nRBC: 0 % (ref 0.0–0.2)

## 2023-12-27 LAB — COMPREHENSIVE METABOLIC PANEL
ALT: 18 U/L (ref 0–44)
AST: 39 U/L (ref 15–41)
Albumin: 3.2 g/dL — ABNORMAL LOW (ref 3.5–5.0)
Alkaline Phosphatase: 51 U/L (ref 38–126)
Anion gap: 9 (ref 5–15)
BUN: 11 mg/dL (ref 8–23)
CO2: 22 mmol/L (ref 22–32)
Calcium: 9.6 mg/dL (ref 8.9–10.3)
Chloride: 108 mmol/L (ref 98–111)
Creatinine, Ser: 1.05 mg/dL — ABNORMAL HIGH (ref 0.44–1.00)
GFR, Estimated: 57 mL/min — ABNORMAL LOW (ref >60)
Glucose, Bld: 154 mg/dL — ABNORMAL HIGH (ref 70–99)
Potassium: 4.2 mmol/L (ref 3.5–5.1)
Sodium: 139 mmol/L (ref 135–145)
Total Bilirubin: 0.4 mg/dL (ref 0.0–1.2)
Total Protein: 6.3 g/dL — ABNORMAL LOW (ref 6.5–8.1)

## 2023-12-27 LAB — LACTIC ACID, PLASMA
Lactic Acid, Venous: 3.8 mmol/L (ref 0.5–1.9)
Lactic Acid, Venous: 2.2 mmol/L (ref 0.5–1.9)

## 2023-12-27 LAB — VITAMIN B12: Vitamin B-12: 269 pg/mL (ref 180–914)

## 2023-12-27 MED ORDER — VITAMIN B-12 1000 MCG PO TABS
500.0000 ug | ORAL_TABLET | Freq: Every day | ORAL | Status: DC
  Administered 2023-12-27 – 2023-12-29 (×3): 500 ug via ORAL
  Filled 2023-12-27 (×3): qty 1

## 2023-12-27 MED ORDER — GUAIFENESIN ER 600 MG PO TB12
1200.0000 mg | ORAL_TABLET | Freq: Two times a day (BID) | ORAL | Status: DC
  Administered 2023-12-27 – 2023-12-29 (×5): 1200 mg via ORAL
  Filled 2023-12-27 (×5): qty 2

## 2023-12-27 NOTE — Progress Notes (Signed)
 Patient arrives from ED to room 5W06 at this time

## 2023-12-27 NOTE — Plan of Care (Signed)

## 2023-12-27 NOTE — Progress Notes (Signed)
 PROGRESS NOTE    Danielle [name]  ZDG:[id number] DOB: January 01, 1951 DOA: 12/25/2023 PCP: [facility]   Brief Narrative: 73 year old with past medical history significant for hypertension, COPD, CAD, memory disturbance, schizoaffective disorder, depression, anxiety, presents with acute respiratory failure, patient developed shortness of breath the day prior to admission.  EMS was contacted at her ALF and she was found to be in acute distress oxygen saturation 70% on room air.  She received albuterol Atrovent and supplemental oxygen.  She was placed on BiPAP in the ED.  Chest x-ray showed vascular congestion and ill-defined basilar opacity, favored to reflect atelectasis.  BNP 94 lactic acid 2.2 influenza Danielle PCR positive.   Assessment & Plan:   Principal Problem:   COPD with acute exacerbation (HCC) Active Problems:   Schizophrenia, paranoid type (HCC)   HYPERTENSION, BENIGN ESSENTIAL   Memory disturbance   CAD (coronary artery disease)   Acute respiratory failure with hypoxia (HCC)   Influenza Danielle   1-Acute hypoxic respiratory failure, in the setting of influenza Danielle and COPD exacerbation -Presented with shortness of breath, found to be hypoxic oxygen sat 70% on room air.  Placed on BiPAP on admission. Off BIPAP> Now on RA.  -Continue Tamiflu -Continue IV ceftriaxone and Azithro -Continue with IV Solu-Medrol  -Scheduled nebulizers Improving. Still BL wheezing on lung exam.  Start Guaifenesin, Flutter valve.   2-Lactic acidosis -2.2----3.7---7.3--4---2.2 -Received IV fluids 1.5 L in the ED -Follow Blood culture: pending.   -Follow trends -CT abdomen Pelvis: No acute CT finding in the abdomen and pelvis.  Mildly enlarged steatotic liver.  Diverticulosis no diverticulitis.  Thickening of the fold of the stomach less thickened than previously. On IV antibiotics.   CAD: -Continue with Lipitor.  On metoprolol.   Hypertension: -Hold Norvasc, systolic blood pressure  soft. -Holder  parameters for metoprolol  Schizoaffective disorder, depression, anxiety -Continue with Seroquel and Zoloft, Lyrica  Memory loss -Delirium precaution.  -B 12: 269 started supplement.    Estimated body mass index is 26.57 kg/m as calculated from the following:   Height as of 08/10/23: 5\' 3"  (1.6 m).   Weight as of this encounter: 68 kg.   DVT prophylaxis: Lovenox Code Status: Full code Family Communication: Daughter 12-26-2023 Disposition Plan:  Status is: Inpatient Remains inpatient appropriate because: management of Influenza    Consultants:  None  Procedures:    Antimicrobials:    Subjective: She is alert, follows command. Report cough, phlegm thick. Difficult to cough up phlegm/.   Objective: Vitals:   12/27/23 0726 12/27/23 0930 12/27/23 0944 12/27/23 1300  BP:  113/74 113/74   Pulse:  86 86 78  Resp:  (!) 26  17  Temp: 98.3 F (36.8 C)     TempSrc: Oral     SpO2:  99%  97%  Weight:        Intake/Output Summary (Last 24 hours) at 12/27/2023 1601 Last data filed at 12/27/2023 1328 Gross per 24 hour  Intake 1600 ml  Output 2601 ml  Net -1001 ml   Filed Weights   12/25/23 2200 12/27/23 0536  Weight: 70.8 kg 68 kg    Examination:  General exam: NAD Respiratory system: Normal Resp effort, BL wheezing.  Cardiovascular system: S 1, S 2 RRR Gastrointestinal system: BS present, soft, nt Central nervous system: Alert Extremities: no edema    Data Reviewed: I have personally reviewed following labs and imaging studies  CBC: Recent Labs  Lab 12/25/23 2145 12/25/23 2149 12/25/23 2158 12/26/23 0430  12/27/23 0431  WBC  --   --  9.0 9.0 13.8*  NEUTROABS  --   --  7.1  --   --   HGB 14.3 14.6 13.1 12.4 12.0  HCT 42.0 43.0 39.9 37.7 35.2*  MCV  --   --  92.6 93.3 88.4  PLT  --   --  224 219 188   Basic Metabolic Panel: Recent Labs  Lab 12/25/23 2145 12/25/23 2149 12/25/23 2158 12/26/23 0430 12/27/23 0534  NA 131* 128*  135 136 139  K 5.5* 8.0* 4.2 3.4* 4.2  CL  --  103 96* 104 108  CO2  --   --  24 17* 22  GLUCOSE  --  131* 137* 179* 154*  BUN  --  15 12 10 11   CREATININE  --  0.90 1.07* 1.17* 1.05*  CALCIUM  --   --  9.6 8.6* 9.6   GFR: Estimated Creatinine Clearance: 46.1 mL/min (Danielle) (by C-G formula based on SCr of 1.05 mg/dL (H)). Liver Function Tests: Recent Labs  Lab 12/25/23 2158 12/27/23 0534  AST 26 39  ALT 13 18  ALKPHOS 61 51  BILITOT 0.7 0.4  PROT 6.9 6.3*  ALBUMIN 3.9 3.2*   No results for input(s): "LIPASE", "AMYLASE" in the last 168 hours. No results for input(s): "AMMONIA" in the last 168 hours. Coagulation Profile: Recent Labs  Lab 12/25/23 2219  INR 1.1   Cardiac Enzymes: No results for input(s): "CKTOTAL", "CKMB", "CKMBINDEX", "TROPONINI" in the last 168 hours. BNP (last 3 results) No results for input(s): "PROBNP" in the last 8760 hours. HbA1C: No results for input(s): "HGBA1C" in the last 72 hours. CBG: No results for input(s): "GLUCAP" in the last 168 hours. Lipid Profile: No results for input(s): "CHOL", "HDL", "LDLCALC", "TRIG", "CHOLHDL", "LDLDIRECT" in the last 72 hours. Thyroid Function Tests: No results for input(s): "TSH", "T4TOTAL", "FREET4", "T3FREE", "THYROIDAB" in the last 72 hours. Anemia Panel: Recent Labs    12/27/23 0534  VITAMINB12 269   Sepsis Labs: Recent Labs  Lab 12/26/23 0037 12/26/23 0430 12/26/23 1235 12/27/23 0855 12/27/23 1116  PROCALCITON <0.10  --   --   --   --   LATICACIDVEN 3.7* 7.3* 4.7* 3.8* 2.2*    Recent Results (from the past 240 hours)  Resp panel by RT-PCR (RSV, Flu Danielle&B, Covid) Anterior Nasal Swab     Status: Abnormal   Collection Time: 12/25/23 10:04 PM   Specimen: Anterior Nasal Swab  Result Value Ref Range Status   SARS Coronavirus 2 by RT PCR NEGATIVE NEGATIVE Final   Influenza Danielle by PCR POSITIVE (Danielle) NEGATIVE Final   Influenza B by PCR NEGATIVE NEGATIVE Final    Comment: (NOTE) The Xpert Xpress  SARS-CoV-2/FLU/RSV plus assay is intended as an aid in the diagnosis of influenza from Nasopharyngeal swab specimens and should not be used as Danielle sole basis for treatment. Nasal washings and aspirates are unacceptable for Xpert Xpress SARS-CoV-2/FLU/RSV testing.  Fact Sheet for Patients: BloggerCourse.com  Fact Sheet for Healthcare Providers: SeriousBroker.it  This test is not yet approved or cleared by the [place] FDA and has been authorized for detection and/or diagnosis of SARS-CoV-2 by FDA under an Emergency Use Authorization (EUA). This EUA will remain in effect (meaning this test can be used) for the duration of the COVID-19 declaration under Section 564(b)(1) of the Act, 21 U.S.C. section 360bbb-3(b)(1), unless the authorization is terminated or revoked.     Resp Syncytial Virus by PCR NEGATIVE NEGATIVE  Final    Comment: (NOTE) Fact Sheet for Patients: BloggerCourse.com  Fact Sheet for Healthcare Providers: SeriousBroker.it  This test is not yet approved or cleared by the [place] FDA and has been authorized for detection and/or diagnosis of SARS-CoV-2 by FDA under an Emergency Use Authorization (EUA). This EUA will remain in effect (meaning this test can be used) for the duration of the COVID-19 declaration under Section 564(b)(1) of the Act, 21 U.S.C. section 360bbb-3(b)(1), unless the authorization is terminated or revoked.  Performed at [facility], [address], [place], [place] [address]   Blood Culture (routine x 2)     Status: None (Preliminary result)   Collection Time: 12/25/23 10:56 PM   Specimen: BLOOD RIGHT HAND  Result Value Ref Range Status   Specimen Description BLOOD RIGHT HAND  Final   Special Requests   Final    BOTTLES DRAWN AEROBIC AND ANAEROBIC Blood Culture adequate volume   Culture   Final    NO GROWTH 2 DAYS Performed at  [facility], [address] [place], [place] [address]    Report Status PENDING  Incomplete         Radiology Studies: CT ABDOMEN PELVIS W CONTRAST Result Date: 12/26/2023 CLINICAL DATA:  Sepsis and respiratory distress. EXAM: CT ABDOMEN AND PELVIS WITH CONTRAST TECHNIQUE: Multidetector CT imaging of the abdomen and pelvis was performed using the standard protocol following bolus administration of intravenous contrast. RADIATION DOSE REDUCTION: This exam was performed according to the departmental dose-optimization program which includes automated exposure control, adjustment of the mA and/or kV according to patient size and/or use of iterative reconstruction technique. CONTRAST:  75mL OMNIPAQUE IOHEXOL 350 MG/ML SOLN COMPARISON:  CT with IV contrast 05/03/2022. FINDINGS: Lower chest: Lung bases are clear. There is mild cardiomegaly. No pericardial effusion. There is Danielle small hiatal hernia. Hepatobiliary: The liver is 20 cm length, mildly steatotic without mass enhancement. The gallbladder and bile ducts are unremarkable. Pancreas: No abnormality. Spleen: No abnormality.  No splenomegaly. Adrenals/Urinary Tract: Adrenal glands are unremarkable. Kidneys are normal, without renal calculi, focal lesion, or hydronephrosis. Bladder is unremarkable. Stomach/Bowel: There are thickened folds in the stomach antrum but less thickened than previously. The small bowel is normal caliber.  The appendix is normal. There is scattered colonic diverticulosis without evidence of diverticulitis or colitis. Vascular/Lymphatic: Aortic atherosclerosis. No enlarged abdominal or pelvic lymph nodes. Multiple pelvic phleboliths. Reproductive: Status post hysterectomy. No adnexal masses. Other: Mild rectus diastasis at the umbilicus is again noted and Danielle small umbilical fat hernia. There is no incarcerated hernia. There is no free fluid, free hemorrhage or free air. Musculoskeletal: There are degenerative changes of the lumbar  spine. No acute or significant osseous findings. Calcified injection granulomas both buttocks. IMPRESSION: 1. No acute CT findings in the abdomen or pelvis. 2. Small hiatal hernia. 3. Mildly enlarged steatotic liver. 4. Diverticulosis without evidence of diverticulitis. 5. Aortic atherosclerosis.  Mild cardiomegaly. 6. Thickening of the folds in the stomach antrum but less thickened than previously. 7. Small umbilical fat hernia and mild rectus diastasis at the umbilicus. Aortic Atherosclerosis (ICD10-I70.0). Electronically Signed   By: Almira Bar M.D.   On: 12/26/2023 05:48   DG Chest Port 1 View Result Date: 12/25/2023 CLINICAL DATA:  Respiratory distress. EXAM: PORTABLE CHEST 1 VIEW COMPARISON:  08/10/2023 FINDINGS: The heart is upper normal in size. Stable mediastinal contours. Vascular congestion. Ill-defined opacity at the lung bases, favor atelectasis. No significant pleural effusion. No pneumothorax. No  acute osseous findings. IMPRESSION: 1. Vascular congestion. 2. Ill-defined opacity at the lung bases, favor atelectasis. Electronically Signed   By: Narda Rutherford M.D.   On: 12/25/2023 22:12        Scheduled Meds:  arformoterol  15 mcg Nebulization BID   atorvastatin  10 mg Oral Daily   budesonide (PULMICORT) nebulizer solution  0.25 mg Nebulization BID   vitamin B-12  500 mcg Oral Daily   enoxaparin (LOVENOX) injection  40 mg Subcutaneous Q24H   guaiFENesin  1,200 mg Oral BID   ipratropium-albuterol  3 mL Nebulization Q6H   methylPREDNISolone (SOLU-MEDROL) injection  40 mg Intravenous Q12H   metoprolol tartrate  12.5 mg Oral BID   oseltamivir  30 mg Oral Q12H   pregabalin  75 mg Oral TID   QUEtiapine  200 mg Oral QHS   sertraline  100 mg Oral Daily   sodium chloride flush  3 mL Intravenous Q12H   valbenazine  80 mg Oral Daily   Continuous Infusions:  sodium chloride 75 mL/hr at 12/27/23 0433   azithromycin Stopped (12/27/23 0243)   cefTRIAXone (ROCEPHIN)  IV Stopped  (12/26/23 2230)     LOS: 1 day    Time spent: 35 minutes    Danielle [name] Danielle Juliauna Stueve, MD [facility]   If 7PM-7AM, please contact night-coverage [email or web address]  12/27/2023, 4:01 PM

## 2023-12-28 DIAGNOSIS — J441 Chronic obstructive pulmonary disease with (acute) exacerbation: Secondary | ICD-10-CM | POA: Diagnosis not present

## 2023-12-28 LAB — BASIC METABOLIC PANEL
Anion gap: 11 (ref 5–15)
BUN: 18 mg/dL (ref 8–23)
CO2: 24 mmol/L (ref 22–32)
Calcium: 10 mg/dL (ref 8.9–10.3)
Chloride: 101 mmol/L (ref 98–111)
Creatinine, Ser: 0.75 mg/dL (ref 0.44–1.00)
GFR, Estimated: >60 mL/min (ref >60)
Glucose, Bld: 120 mg/dL — ABNORMAL HIGH (ref 70–99)
Potassium: 4.4 mmol/L (ref 3.5–5.1)
Sodium: 136 mmol/L (ref 135–145)

## 2023-12-28 LAB — CBC
HCT: 39.6 % (ref 36.0–46.0)
Hemoglobin: 13.4 g/dL (ref 12.0–15.0)
MCH: 29.8 pg (ref 26.0–34.0)
MCHC: 33.8 g/dL (ref 30.0–36.0)
MCV: 88.2 fL (ref 80.0–100.0)
Platelets: 254 10*3/uL (ref 150–400)
RBC: 4.49 MIL/uL (ref 3.87–5.11)
RDW: 14.1 % (ref 11.5–15.5)
WBC: 11.4 10*3/uL — ABNORMAL HIGH (ref 4.0–10.5)
nRBC: 0 % (ref 0.0–0.2)

## 2023-12-28 NOTE — Progress Notes (Addendum)
   12/28/23 1550  Mobility  Activity Ambulated with assistance in hallway  Level of Assistance Contact guard assist, steadying assist  Assistive Device Front wheel walker;None  Distance Ambulated (ft) 150 ft  Activity Response Tolerated fair  Mobility Referral Yes  Mobility visit 1 Mobility  Mobility Specialist Start Time (ACUTE ONLY) 1550  Mobility Specialist Stop Time (ACUTE ONLY) 1605  Mobility Specialist Time Calculation (min) (ACUTE ONLY) 15 min   Mobility Specialist: Progress Note  Pre-Mobility:      HR 81, SpO2 95% RA During Mobility:             SpO2 92% RA Post-Mobility:    HR 88, SpO2 96% RA  Pt agreeable to mobility session - received in bed. Required MinA for STS, CG for ambulation. Pt with steady gait without AD. Pt noted RW used veered off to left. C/o SOB, VSS. Returned to chair with all needs met - call bell within reach. Chair alarm on.   Barnie Mort, BS Mobility Specialist Please contact via SecureChat or  Rehab office at [phone].

## 2023-12-28 NOTE — Progress Notes (Addendum)
 PROGRESS NOTE    Danielle [name]  ZOX:[id number] DOB: [birth year] DOA: 12/25/2023 PCP: [facility]   Brief Narrative:  73 year old with past medical history significant for hypertension, COPD, CAD, memory disturbance, schizoaffective disorder, depression, anxiety, presents with acute respiratory failure, patient developed shortness of breath the day prior to admission.  EMS was contacted at her ALF and she was found to be in acute distress oxygen saturation 70% on room air.  Patient was placed on BiPAP in the ED.  Chest x-ray showed vascular congestion and ill-defined basilar opacity, favored to reflect atelectasis.  BNP 94 lactic acid 2.2 influenza A PCR positive.  Patient was then admitted hospital for further evaluation and treatment.  Assessment & Plan:   Principal Problem:   COPD with acute exacerbation (HCC) Active Problems:   Schizophrenia, paranoid type (HCC)   HYPERTENSION, BENIGN ESSENTIAL   Memory disturbance   CAD (coronary artery disease)   Acute respiratory failure with hypoxia (HCC)   Influenza A   Acute hypoxic respiratory failure, in the setting of influenza A and COPD exacerbation Patient had hypoxia with pulse ox of 70% on room air and was on BiPAP initially.  Currently on room air.  Continue Tamiflu Rocephin and Zithromax.  Continue IV Solu-Medrol.  Continue nebulizers.  Continue supportive care.  -Lactic acidosis Received IV fluids.  Blood cultures pending.  CT scan without any acute findings.  On IV antibiotics.  Lactic acidosis likely secondary to increased work of breathing.  Will discontinue IV fluid today.  CAD: -Continue with Lipitor, metoprolol.   Hypertension: Norvasc on hold.  Metoprolol with holding parameters.  Schizoaffective disorder, depression, anxiety Continue Seroquel and Zoloft, Lyrica  Memory loss -Delirium precaution. B12 was low so supplementation has been initiated.  Unsteady feet.  Will get PT evaluation.  DVT  prophylaxis: Lovenox subcu  Code Status: Full code  Family Communication:  Previous provider had spoken with the patient's daughter  12-26-2023  Disposition Plan: Likely home in 1 to 2 days.  Status is: Inpatient Remains inpatient appropriate because: Pending clinical improvement might need desat study prior to discharge   Consultants:  None  Procedures:  None  Antimicrobials:  None  Subjective: patient was seen and examined at bedside.  Feels a little better with breathing.  Mild shortness of breath no chest pain fever or chills.    Objective: Vitals:   12/27/23 2112 12/28/23 0000 12/28/23 0401 12/28/23 0926  BP: 129/79 (!) 146/88 (!) 144/82 (!) 113/93  Pulse: 78 70 73 90  Resp:  16 17 18   Temp:  98 F (36.7 C) 98.3 F (36.8 C) 97.9 F (36.6 C)  TempSrc:  Oral Oral Oral  SpO2:  94% (!) 88% 94%  Weight:      Height: 5\' 2"  (1.575 m)       Intake/Output Summary (Last 24 hours) at 12/28/2023 1035 Last data filed at 12/28/2023 0900 Gross per 24 hour  Intake 1418.97 ml  Output 3250 ml  Net -1831.03 ml   Filed Weights   12/25/23 2200 12/27/23 0536  Weight: 70.8 kg 68 kg    Physical examination:  General:  Average built, not in obvious distress, on nasal cannula oxygen, HENT:   No scleral pallor or icterus noted. Oral mucosa is moist.  Chest: Mild wheezing noted.  Decreased breath sounds bilaterally. CVS: S1 &S2 heard. No murmur.  Regular rate and rhythm. Abdomen: Soft, nontender, nondistended.  Bowel sounds are heard.   Extremities: No cyanosis, clubbing or edema.  Peripheral pulses  are palpable. Psych: Alert, awake and oriented, normal mood CNS:  No cranial nerve deficits.  Power equal in all extremities.   Skin: Warm and dry.  No rashes noted.  Data Reviewed: I have personally reviewed following labs and imaging studies  CBC: Recent Labs  Lab 12/25/23 2149 12/25/23 2158 12/26/23 0430 12/27/23 0431 12/28/23 0425  WBC  --  9.0 9.0 13.8* 11.4*   NEUTROABS  --  7.1  --   --   --   HGB 14.6 13.1 12.4 12.0 13.4  HCT 43.0 39.9 37.7 35.2* 39.6  MCV  --  92.6 93.3 88.4 88.2  PLT  --  224 219 188 254   Basic Metabolic Panel: Recent Labs  Lab 12/25/23 2149 12/25/23 2158 12/26/23 0430 12/27/23 0534 12/28/23 0425  NA 128* 135 136 139 136  K 8.0* 4.2 3.4* 4.2 4.4  CL 103 96* 104 108 101  CO2  --  24 17* 22 24  GLUCOSE 131* 137* 179* 154* 120*  BUN 15 12 10 11 18   CREATININE 0.90 1.07* 1.17* 1.05* 0.75  CALCIUM  --  9.6 8.6* 9.6 10.0   GFR: Estimated Creatinine Clearance: 59.2 mL/min (by C-G formula based on SCr of 0.75 mg/dL). Liver Function Tests: Recent Labs  Lab 12/25/23 2158 12/27/23 0534  AST 26 39  ALT 13 18  ALKPHOS 61 51  BILITOT 0.7 0.4  PROT 6.9 6.3*  ALBUMIN 3.9 3.2*   No results for input(s): "LIPASE", "AMYLASE" in the last 168 hours. No results for input(s): "AMMONIA" in the last 168 hours. Coagulation Profile: Recent Labs  Lab 12/25/23 2219  INR 1.1   Cardiac Enzymes: No results for input(s): "CKTOTAL", "CKMB", "CKMBINDEX", "TROPONINI" in the last 168 hours. BNP (last 3 results) No results for input(s): "PROBNP" in the last 8760 hours. HbA1C: No results for input(s): "HGBA1C" in the last 72 hours. CBG: No results for input(s): "GLUCAP" in the last 168 hours. Lipid Profile: No results for input(s): "CHOL", "HDL", "LDLCALC", "TRIG", "CHOLHDL", "LDLDIRECT" in the last 72 hours. Thyroid Function Tests: No results for input(s): "TSH", "T4TOTAL", "FREET4", "T3FREE", "THYROIDAB" in the last 72 hours. Anemia Panel: Recent Labs    12/27/23 0534  VITAMINB12 269   Sepsis Labs: Recent Labs  Lab 12/26/23 0037 12/26/23 0430 12/26/23 1235 12/27/23 0855 12/27/23 1116  PROCALCITON <0.10  --   --   --   --   LATICACIDVEN 3.7* 7.3* 4.7* 3.8* 2.2*    Recent Results (from the past 240 hours)  Blood Culture (routine x 2)     Status: None (Preliminary result)   Collection Time: 12/25/23  9:55 PM    Specimen: BLOOD LEFT FOREARM  Result Value Ref Range Status   Specimen Description BLOOD LEFT FOREARM  Final   Special Requests   Final    BOTTLES DRAWN AEROBIC AND ANAEROBIC Blood Culture results may not be optimal due to an inadequate volume of blood received in culture bottles   Culture   Final    NO GROWTH 3 DAYS Performed at [facility], [address], [place], [place] [address]    Report Status PENDING  Incomplete  Resp panel by RT-PCR (RSV, Flu A&B, Covid) Anterior Nasal Swab     Status: Abnormal   Collection Time: 12/25/23 10:04 PM   Specimen: Anterior Nasal Swab  Result Value Ref Range Status   SARS Coronavirus 2 by RT PCR NEGATIVE NEGATIVE Final   Influenza A by PCR POSITIVE (A) NEGATIVE Final  Influenza B by PCR NEGATIVE NEGATIVE Final    Comment: (NOTE) The Xpert Xpress SARS-CoV-2/FLU/RSV plus assay is intended as an aid in the diagnosis of influenza from Nasopharyngeal swab specimens and should not be used as a sole basis for treatment. Nasal washings and aspirates are unacceptable for Xpert Xpress SARS-CoV-2/FLU/RSV testing.  Fact Sheet for Patients: BloggerCourse.com  Fact Sheet for Healthcare Providers: SeriousBroker.it  This test is not yet approved or cleared by the [place] FDA and has been authorized for detection and/or diagnosis of SARS-CoV-2 by FDA under an Emergency Use Authorization (EUA). This EUA will remain in effect (meaning this test can be used) for the duration of the COVID-19 declaration under Section 564(b)(1) of the Act, 21 U.S.C. section 360bbb-3(b)(1), unless the authorization is terminated or revoked.     Resp Syncytial Virus by PCR NEGATIVE NEGATIVE Final    Comment: (NOTE) Fact Sheet for Patients: BloggerCourse.com  Fact Sheet for Healthcare Providers: SeriousBroker.it  This test is not yet approved or cleared by the  [place] FDA and has been authorized for detection and/or diagnosis of SARS-CoV-2 by FDA under an Emergency Use Authorization (EUA). This EUA will remain in effect (meaning this test can be used) for the duration of the COVID-19 declaration under Section 564(b)(1) of the Act, 21 U.S.C. section 360bbb-3(b)(1), unless the authorization is terminated or revoked.  Performed at [facility], [address] [place], [place] [address]   Blood Culture (routine x 2)     Status: None (Preliminary result)   Collection Time: 12/25/23 10:56 PM   Specimen: BLOOD RIGHT HAND  Result Value Ref Range Status   Specimen Description BLOOD RIGHT HAND  Final   Special Requests   Final    BOTTLES DRAWN AEROBIC AND ANAEROBIC Blood Culture adequate volume   Culture   Final    NO GROWTH 3 DAYS Performed at [facility], [address] [place], [place] [address]    Report Status PENDING  Incomplete    Radiology Studies: No results found.  Scheduled Meds:  arformoterol  15 mcg Nebulization BID   atorvastatin  10 mg Oral Daily   budesonide (PULMICORT) nebulizer solution  0.25 mg Nebulization BID   vitamin B-12  500 mcg Oral Daily   enoxaparin (LOVENOX) injection  40 mg Subcutaneous Q24H   guaiFENesin  1,200 mg Oral BID   ipratropium-albuterol  3 mL Nebulization Q6H   methylPREDNISolone (SOLU-MEDROL) injection  40 mg Intravenous Q12H   metoprolol tartrate  12.5 mg Oral BID   oseltamivir  30 mg Oral Q12H   pregabalin  75 mg Oral TID   QUEtiapine  200 mg Oral QHS   sertraline  100 mg Oral Daily   sodium chloride flush  3 mL Intravenous Q12H   valbenazine  80 mg Oral Daily   Continuous Infusions:  sodium chloride Stopped (12/28/23 0445)   azithromycin Stopped (12/28/23 0145)   cefTRIAXone (ROCEPHIN)  IV Stopped (12/28/23 0000)     LOS: 2 days   Joycelyn Das, MD [facility] 12/28/2023, 10:35 AM

## 2023-12-28 NOTE — TOC Initial Note (Addendum)
 Transition of Care [facility]) - Initial/Assessment Note    Patient Details  Name: Danielle [name] MRN: [id number] Date of Birth: December 17, 1950  Transition of Care [facility]) CM/SW Contact:    Mearl Latin, LCSW Phone Number: 12/28/2023, 10:47 AM  Clinical Narrative:                 Patient admitted from [facility]F with Flu.   CSW spoke with Lurena Joiner at [facility] to obtain patient's baseline and she reported patient can walk typically with a walker. CSW will continue to follow.  Expected Discharge Plan: Assisted Living Barriers to Discharge: Continued Medical Work up   Patient Goals and CMS Choice Patient states their goals for this hospitalization and ongoing recovery are:: Return to ALF          Expected Discharge Plan and Services In-house Referral: Clinical Social Work     Living arrangements for the past 2 months: Assisted Living Facility                                      Prior Living Arrangements/Services Living arrangements for the past 2 months: Assisted Living Facility Lives with:: Facility Resident Patient language and need for interpreter reviewed:: Yes Do you feel safe going back to the place where you live?: Yes      Need for Family Participation in Patient Care: Yes (Comment) Care giver support system in place?: Yes (comment)   Criminal Activity/Legal Involvement Pertinent to Current Situation/Hospitalization: No - Comment as needed  Activities of Daily Living   ADL Screening (condition at time of admission) Independently performs ADLs?: Yes (appropriate for developmental age) Is the patient deaf or have difficulty hearing?: No Does the patient have difficulty seeing, even when wearing glasses/contacts?: No Does the patient have difficulty concentrating, remembering, or making decisions?: No  Permission Sought/Granted Permission sought to share information with : Facility Medical sales representative, Family Supports Permission granted to  share information with : Yes, Verbal Permission Granted  Share Information with NAME: Suzette Battiest  Permission granted to share info w AGENCY: ALF  Permission granted to share info w Relationship: Daughter  Permission granted to share info w Contact Information: [phone]  Emotional Assessment Appearance:: Appears stated age Attitude/Demeanor/Rapport: Unable to Assess Affect (typically observed): Unable to Assess Orientation: : Oriented to Self, Oriented to Place, Oriented to Situation Alcohol / Substance Use: Not Applicable Psych Involvement: No (comment)  Admission diagnosis:  Influenza A [J10.1] COPD exacerbation (HCC) [J44.1] COPD with acute exacerbation (HCC) [J44.1] Acute hypoxic respiratory failure (HCC) [J96.01] Patient Active Problem List   Diagnosis Date Noted   Influenza A 12/26/2023   Acute hypoxemic respiratory failure (HCC) 08/07/2023   COPD exacerbation (HCC) 10/16/2022   COPD with acute exacerbation (HCC) 10/15/2022   Acute respiratory failure with hypoxia (HCC) 10/15/2022   GI bleeding 05/11/2022   Acute blood loss anemia 05/11/2022   Major depressive disorder, recurrent episode, moderate (HCC)    Dysarthria 12/29/2020   AKI (acute kidney injury) (HCC) 12/29/2020   CAD (coronary artery disease) 03/22/2019   Unstable angina (HCC)    Hypokalemia 08/11/2018   Tobacco abuse 08/11/2018   Memory disturbance 01/30/2016   Schizophrenia, paranoid type (HCC) 04/28/2012   Cocaine abuse (HCC) 04/28/2012   Cannabis abuse 04/28/2012   Substance induced mood disorder (HCC) 04/28/2012   Noncompliance 09/16/2011   Anxiety 09/16/2011   Constipation 09/16/2011   Polysubstance abuse (HCC) 12/03/2008  Hyperlipidemia 08/01/2008   MIGRAINE HEADACHE 08/01/2008   ASTHMA 08/01/2008   DEPRESSION 06/20/2008   ANXIETY DISORDER, GENERALIZED 05/28/2008   HYPERTENSION, BENIGN ESSENTIAL 05/28/2008   GERD 05/28/2008   DEEP VENOUS THROMBOPHLEBITIS, LEFT, LEG, HX OF 10/18/1988   PCP:   [facility]:   [facility] [place], [place] - [address] [place] [address] Phone: [phone] Fax: [phone]     Social Drivers of Health (SDOH) Social History: SDOH Screenings   Food Insecurity: No Food Insecurity (12/26/2023)  Housing: Low Risk  (12/26/2023)  Transportation Needs: No Transportation Needs (12/26/2023)  Utilities: Not At Risk (12/26/2023)  Depression (PHQ2-9): Low Risk  (06/18/2019)  Social Connections: Socially Isolated (12/26/2023)  Tobacco Use: High Risk (12/26/2023)   SDOH Interventions:     Readmission Risk Interventions    08/15/2023   10:26 AM 10/19/2022   10:48 AM 10/18/2022   12:54 PM  Readmission Risk Prevention Plan  Transportation Screening Complete Complete Complete  PCP or Specialist Appt within 5-7 Days Complete    PCP or Specialist Appt within 3-5 Days  Complete Complete  Home Care Screening Complete    Medication Review (RN CM) Complete    HRI or Home Care Consult  Complete Complete  Social Work Consult for Recovery Care Planning/Counseling  Complete Complete  Palliative Care Screening  Complete Complete  Medication Review Oceanographer)  Complete Complete

## 2023-12-28 NOTE — Plan of Care (Signed)
  Problem: Clinical Measurements: Goal: Ability to maintain clinical measurements within normal limits will improve Outcome: Progressing Goal: Diagnostic test results will improve Outcome: Progressing Goal: Respiratory complications will improve Outcome: Progressing Goal: Cardiovascular complication will be avoided Outcome: Progressing   Problem: Activity: Goal: Risk for activity intolerance will decrease Outcome: Progressing   Problem: Nutrition: Goal: Adequate nutrition will be maintained Outcome: Progressing   Problem: Coping: Goal: Level of anxiety will decrease Outcome: Progressing   Problem: Elimination: Goal: Will not experience complications related to bowel motility Outcome: Progressing Goal: Will not experience complications related to urinary retention Outcome: Progressing   Problem: Pain Managment: Goal: General experience of comfort will improve and/or be controlled Outcome: Progressing   Problem: Safety: Goal: Ability to remain free from injury will improve Outcome: Progressing   Problem: Skin Integrity: Goal: Risk for impaired skin integrity will decrease Outcome: Progressing   Problem: Education: Goal: Knowledge of the prescribed therapeutic regimen will improve Outcome: Progressing   Problem: Activity: Goal: Ability to tolerate increased activity will improve Outcome: Progressing   Problem: Respiratory: Goal: Ability to maintain a clear airway will improve Outcome: Progressing Goal: Levels of oxygenation will improve Outcome: Progressing Goal: Ability to maintain adequate ventilation will improve Outcome: Progressing

## 2023-12-29 DIAGNOSIS — J441 Chronic obstructive pulmonary disease with (acute) exacerbation: Secondary | ICD-10-CM | POA: Diagnosis not present

## 2023-12-29 LAB — BASIC METABOLIC PANEL
Anion gap: 12 (ref 5–15)
BUN: 23 mg/dL (ref 8–23)
CO2: 20 mmol/L — ABNORMAL LOW (ref 22–32)
Calcium: 10.2 mg/dL (ref 8.9–10.3)
Chloride: 104 mmol/L (ref 98–111)
Creatinine, Ser: 0.92 mg/dL (ref 0.44–1.00)
GFR, Estimated: >60 mL/min (ref >60)
Glucose, Bld: 131 mg/dL — ABNORMAL HIGH (ref 70–99)
Potassium: 4.2 mmol/L (ref 3.5–5.1)
Sodium: 136 mmol/L (ref 135–145)

## 2023-12-29 LAB — CBC
HCT: 44.1 % (ref 36.0–46.0)
Hemoglobin: 15.1 g/dL — ABNORMAL HIGH (ref 12.0–15.0)
MCH: 29.8 pg (ref 26.0–34.0)
MCHC: 34.2 g/dL (ref 30.0–36.0)
MCV: 87 fL (ref 80.0–100.0)
Platelets: 272 10*3/uL (ref 150–400)
RBC: 5.07 MIL/uL (ref 3.87–5.11)
RDW: 13.8 % (ref 11.5–15.5)
WBC: 8.8 10*3/uL (ref 4.0–10.5)
nRBC: 0 % (ref 0.0–0.2)

## 2023-12-29 MED ORDER — HYDROCODONE-ACETAMINOPHEN 10-325 MG PO TABS: 1.0000 | ORAL_TABLET | ORAL | 0 refills | Status: AC

## 2023-12-29 MED ORDER — PREDNISONE 10 MG PO TABS: 30.0000 mg | ORAL_TABLET | Freq: Every day | ORAL | 0 refills | Status: DC

## 2023-12-29 MED ORDER — GUAIFENESIN ER 600 MG PO TB12: 1200.0000 mg | ORAL_TABLET | Freq: Two times a day (BID) | ORAL | 0 refills | Status: AC

## 2023-12-29 MED ORDER — CYANOCOBALAMIN 500 MCG PO TABS: 1000.0000 ug | ORAL_TABLET | Freq: Every day | ORAL | 0 refills | Status: DC

## 2023-12-29 MED ORDER — CYANOCOBALAMIN 500 MCG PO TABS: 1000.0000 ug | ORAL_TABLET | Freq: Every day | ORAL | 0 refills | Status: AC

## 2023-12-29 MED ORDER — PREDNISONE 10 MG PO TABS
30.0000 mg | ORAL_TABLET | Freq: Every day | ORAL | 0 refills | Status: AC
Start: 2023-12-29 — End: 2024-01-01

## 2023-12-29 MED ORDER — PREGABALIN 75 MG PO CAPS: 75.0000 mg | ORAL_CAPSULE | Freq: Three times a day (TID) | ORAL | 0 refills | Status: AC

## 2023-12-29 MED ORDER — GUAIFENESIN ER 600 MG PO TB12: 1200.0000 mg | ORAL_TABLET | Freq: Two times a day (BID) | ORAL | 0 refills | Status: DC

## 2023-12-29 MED ORDER — OSELTAMIVIR PHOSPHATE 30 MG PO CAPS: 30.0000 mg | ORAL_CAPSULE | Freq: Two times a day (BID) | ORAL | 0 refills | Status: AC

## 2023-12-29 MED ORDER — OSELTAMIVIR PHOSPHATE 30 MG PO CAPS: 30.0000 mg | ORAL_CAPSULE | Freq: Two times a day (BID) | ORAL | 0 refills | Status: DC

## 2023-12-29 NOTE — Progress Notes (Signed)
 Pt's IV infiltrated while running azithromycin. Pt denied pain and discomfort to the site. Pharmacy notified. No special interventions advised. Pt's arm elevated with warm compress. Will continue to monitor.

## 2023-12-29 NOTE — Progress Notes (Signed)
 Report called to patients ALF at this time and given to [facility]

## 2023-12-29 NOTE — TOC Transition Note (Signed)
 Transition of Care [facility]) - Discharge Note   Patient Details  Name: Danielle [name] MRN: [id number] Date of Birth: 18-May-1951  Transition of Care [facility]) CM/SW Contact:  Mearl Latin, LCSW Phone Number: 12/29/2023, 3:13 PM   Clinical Narrative:    Patient will DC to: [facility] Anticipated DC date: 12/29/23 Family notified: Left vm for daughter Transport by: Sharin Mons   Per MD patient ready for DC to [facility]. RN to call report prior to discharge [phone]). RN, patient, patient's family, and facility notified of DC. Discharge Summary and FL2 sent to facility. DC packet on chart including signed scripts. Ambulance transport requested for patient.   CSW will sign off for now as social work intervention is no longer needed. Please consult [place] again if new needs arise.     Final next level of care: Assisted Living Barriers to Discharge: Barriers Resolved   Patient Goals and CMS Choice Patient states their goals for this hospitalization and ongoing recovery are:: Return to ALF          Discharge Placement                Patient to be transferred to facility by: PTAR Name of family member notified: Daughter Patient and family notified of of transfer: 12/29/23  Discharge Plan and Services Additional resources added to the After Visit Summary for   In-house Referral: Clinical Social Work                                   Social Drivers of Health (SDOH) Interventions SDOH Screenings   Food Insecurity: No Food Insecurity (12/26/2023)  Housing: Low Risk  (12/26/2023)  Transportation Needs: No Transportation Needs (12/26/2023)  Utilities: Not At Risk (12/26/2023)  Depression (PHQ2-9): Low Risk  (06/18/2019)  Social Connections: Socially Isolated (12/26/2023)  Tobacco Use: High Risk (12/26/2023)     Readmission Risk Interventions    08/15/2023   10:26 AM 10/19/2022   10:48 AM 10/18/2022   12:54 PM  Readmission Risk Prevention Plan  Transportation  Screening Complete Complete Complete  PCP or Specialist Appt within 5-7 Days Complete    PCP or Specialist Appt within 3-5 Days  Complete Complete  Home Care Screening Complete    Medication Review (RN CM) Complete    HRI or Home Care Consult  Complete Complete  Social Work Consult for Recovery Care Planning/Counseling  Complete Complete  Palliative Care Screening  Complete Complete  Medication Review Oceanographer)  Complete Complete

## 2023-12-29 NOTE — TOC Progression Note (Signed)
 Transition of Care [facility]) - Progression Note    Patient Details  Name: Danielle [name] MRN: [id number] Date of Birth: 1951-06-14  Transition of Care [facility]) CM/SW Contact  Mearl Latin, LCSW Phone Number: 12/29/2023, 2:52 PM  Clinical Narrative:    CSW spoke with Lurena Joiner at [facility]F and made her aware of patient's return. CSW faxef DC Summary and Fl2 to facility as requested.  CSW left voicemail for daughter. Patient stated she will need PTAR.    Expected Discharge Plan: Assisted Living Barriers to Discharge: Barriers Resolved  Expected Discharge Plan and Services In-house Referral: Clinical Social Work     Living arrangements for the past 2 months: Assisted Living Facility Expected Discharge Date: 12/29/23                                     Social Determinants of Health (SDOH) Interventions SDOH Screenings   Food Insecurity: No Food Insecurity (12/26/2023)  Housing: Low Risk  (12/26/2023)  Transportation Needs: No Transportation Needs (12/26/2023)  Utilities: Not At Risk (12/26/2023)  Depression (PHQ2-9): Low Risk  (06/18/2019)  Social Connections: Socially Isolated (12/26/2023)  Tobacco Use: High Risk (12/26/2023)    Readmission Risk Interventions    08/15/2023   10:26 AM 10/19/2022   10:48 AM 10/18/2022   12:54 PM  Readmission Risk Prevention Plan  Transportation Screening Complete Complete Complete  PCP or Specialist Appt within 5-7 Days Complete    PCP or Specialist Appt within 3-5 Days  Complete Complete  Home Care Screening Complete    Medication Review (RN CM) Complete    HRI or Home Care Consult  Complete Complete  Social Work Consult for Recovery Care Planning/Counseling  Complete Complete  Palliative Care Screening  Complete Complete  Medication Review Oceanographer)  Complete Complete

## 2023-12-29 NOTE — NC FL2 (Signed)
 [place] MEDICAID FL2 LEVEL OF CARE FORM     IDENTIFICATION  Patient Name: Danielle [name] Birthdate: 09/04/1951 Sex: female Admission Date (Current Location): 12/25/2023  [facility] and [place] Number:  Producer, television/film/video and Address:  [facility], [address] [place], [place] [address]      Provider Number: [id number]  Attending Physician Name and Address:  Joycelyn Das, MD  Relative Name and Phone Number:       Current Level of Care: Hospital Recommended Level of Care: Assisted Living Facility Prior Approval Number:    Date Approved/Denied:   PASRR Number:    Discharge Plan: Other (Comment) (ALF)    Current Diagnoses: Patient Active Problem List   Diagnosis Date Noted   Influenza A 12/26/2023   Acute hypoxemic respiratory failure (HCC) 08/07/2023   COPD exacerbation (HCC) 10/16/2022   COPD with acute exacerbation (HCC) 10/15/2022   Acute respiratory failure with hypoxia (HCC) 10/15/2022   GI bleeding 05/11/2022   Acute blood loss anemia 05/11/2022   Major depressive disorder, recurrent episode, moderate (HCC)    Dysarthria 12/29/2020   AKI (acute kidney injury) (HCC) 12/29/2020   CAD (coronary artery disease) 03/22/2019   Unstable angina (HCC)    Hypokalemia 08/11/2018   Tobacco abuse 08/11/2018   Memory disturbance 01/30/2016   Schizophrenia, paranoid type (HCC) 04/28/2012   Cocaine abuse (HCC) 04/28/2012   Cannabis abuse 04/28/2012   Substance induced mood disorder (HCC) 04/28/2012   Noncompliance 09/16/2011   Anxiety 09/16/2011   Constipation 09/16/2011   Polysubstance abuse (HCC) 12/03/2008   Hyperlipidemia 08/01/2008   MIGRAINE HEADACHE 08/01/2008   ASTHMA 08/01/2008   DEPRESSION 06/20/2008   ANXIETY DISORDER, GENERALIZED 05/28/2008   HYPERTENSION, BENIGN ESSENTIAL 05/28/2008   GERD 05/28/2008   DEEP VENOUS THROMBOPHLEBITIS, LEFT, LEG, HX OF 10/18/1988    Orientation RESPIRATION BLADDER Height & Weight      Self, Situation, Place  Normal Incontinent Weight: 150 lb (68 kg) Height:  5\' 2"  (157.5 cm)  BEHAVIORAL SYMPTOMS/MOOD NEUROLOGICAL BOWEL NUTRITION STATUS      Continent Diet (Regular)  AMBULATORY STATUS COMMUNICATION OF NEEDS Skin   Supervision Verbally Normal                       Personal Care Assistance Level of Assistance  Bathing, Feeding, Dressing Bathing Assistance: Limited assistance Feeding assistance: Independent Dressing Assistance: Limited assistance     Functional Limitations Info             SPECIAL CARE FACTORS FREQUENCY                       Contractures Contractures Info: Not present    Additional Factors Info  Code Status, Allergies, Isolation Precautions Code Status Info: Full Allergies Info: Clonazepam, Doxycycline, Fosinopril Sodium, Hydrochlorothiazide W-triamterene, Penicillins, Sulfa Antibiotics, Triamterene     Isolation Precautions Info: Flu+     Current Medications (12/29/2023):    Discharge Medications: TAKE these medications     albuterol (2.5 MG/3ML) 0.083% nebulizer solution Commonly known as: PROVENTIL Take 2.5 mg by nebulization every 6 (six) hours as needed for shortness of breath.    albuterol 108 (90 Base) MCG/ACT inhaler Commonly known as: VENTOLIN HFA Inhale 1-2 puffs into the lungs every 4 (four) hours as needed for wheezing or shortness of breath (or cough).    amLODipine 5 MG tablet Commonly known as: NORVASC Take 5 mg by mouth daily.    atorvastatin 10  MG tablet Commonly known as: LIPITOR Take 10 mg by mouth daily.    budesonide-formoterol 160-4.5 MCG/ACT inhaler Commonly known as: Symbicort Inhale 2 puffs into the lungs 2 (two) times daily.    busPIRone 15 MG tablet Commonly known as: BUSPAR Take 15 mg by mouth 3 (three) times daily.    cyanocobalamin 500 MCG tablet Commonly known as: VITAMIN B12 Take 2 tablets (1,000 mcg total) by mouth daily. Start taking on: December 30, 2023    DHEA 25 MG  Caps Take 25 mg by mouth in the morning.    guaiFENesin 600 MG 12 hr tablet Commonly known as: MUCINEX Take 2 tablets (1,200 mg total) by mouth 2 (two) times daily for 15 days.    HYDROcodone-acetaminophen 10-325 MG tablet Commonly known as: NORCO Take 1 tablet by mouth See admin instructions. Give 1 tablet by mouth 5 time a day as needed    hydrOXYzine 25 MG capsule Commonly known as: VISTARIL Take 25 mg by mouth 2 (two) times daily as needed for anxiety.    Ingrezza 80 MG capsule Generic drug: valbenazine Take 1 capsule (80 mg total) by mouth daily.    latanoprost 0.005 % ophthalmic solution Commonly known as: XALATAN Place 1 drop into both eyes at bedtime.    loratadine 10 MG tablet Commonly known as: CLARITIN Take 10 mg by mouth daily.    metoprolol tartrate 25 MG tablet Commonly known as: LOPRESSOR Take 1 tablet (25 mg total) by mouth 2 (two) times daily.    nicotine 21 mg/24hr patch Commonly known as: NICODERM CQ - dosed in mg/24 hours Place 21 mg onto the skin daily.    oseltamivir 30 MG capsule Commonly known as: TAMIFLU Take 1 capsule (30 mg total) by mouth 2 (two) times daily for 2 days.    predniSONE 10 MG tablet Commonly known as: DELTASONE Take 3 tablets (30 mg total) by mouth daily for 3 days.    pregabalin 75 MG capsule Commonly known as: LYRICA Take 75 mg by mouth 3 (three) times daily.    QUEtiapine 200 MG tablet Commonly known as: SEROQUEL Take 200 mg by mouth at bedtime.    sertraline 100 MG tablet Commonly known as: ZOLOFT Take 100 mg by mouth daily.    tiZANidine 2 MG tablet Commonly known as: ZANAFLEX Take 2 mg by mouth every 8 (eight) hours as needed for muscle spasms.    Trelegy Ellipta 200-62.5-25 MCG/ACT Aepb Generic drug: Fluticasone-Umeclidin-Vilant Inhale 1 puff into the lungs daily.    triamcinolone cream 0.1 % Commonly known as: KENALOG Apply 1 g topically See admin instructions. Apply (1 grams) green pea-sized bead to  popliteal fossae twice a day    Vitamin D3 250 MCG (10000 UT) Tabs Take 10,000 Units by mouth daily.    Relevant Imaging Results:  Relevant Lab Results:   Additional Information SSN [id number]  Mearl Latin, LCSW

## 2023-12-29 NOTE — Evaluation (Signed)
 Physical Therapy Evaluation Patient Details Name: Danielle [name] MRN: [id number] DOB: 12-19-1950 Today's Date: 12/29/2023  History of Present Illness  73 y.o. female admitted 3/9 with acute respiratory failure, Flu A +. PMH: COPD, schizophrenia, HTN, CAD, bipolar, stroke, tardive dyskinesia  Clinical Impression  Pt admitted with above diagnosis. Feels that is approaching baseline mobility level which is mod I with a rollator at ALF. Able to ambulate with supervision for safety 200', no loss of balance, SpO2 92% on RA, HR increased to 131 at peak, returned to 101 upon sitting. Oriented x 2. Pt currently with functional limitations due to the deficits listed below (see PT Problem List). Pt will benefit from acute skilled PT to increase their independence and safety with mobility to allow discharge.       SpO2 94% at rest on RA. 92% ambulating on RA. HR 101 at rest, up to 131 with gait.     If plan is discharge home, recommend the following: A little help with bathing/dressing/bathroom;Assistance with cooking/housework;Supervision due to cognitive status;Direct supervision/assist for financial management;Direct supervision/assist for medications management;Assist for transportation   Can travel by private vehicle        Equipment Recommendations None recommended by PT  Recommendations for Other Services       Functional Status Assessment Patient has had a recent decline in their functional status and demonstrates the ability to make significant improvements in function in a reasonable and predictable amount of time.     Precautions / Restrictions Precautions Precautions: Fall Recall of Precautions/Restrictions: Impaired Precaution/Restrictions Comments: Monitor O2 and HR Restrictions Weight Bearing Restrictions Per Provider Order: No      Mobility  Bed Mobility Overal bed mobility: Modified Independent             General bed mobility comments: extra time, assisted with  lines/leads only    Transfers Overall transfer level: Modified independent Equipment used: Rolling walker (2 wheels)               General transfer comment: slower to rise but stable with use of RW for support upon standing.    Ambulation/Gait Ambulation/Gait assistance: Supervision Gait Distance (Feet): 200 Feet Assistive device: Rolling walker (2 wheels), IV Pole Gait Pattern/deviations: Step-through pattern, Decreased stride length, Drifts right/left Gait velocity: dec Gait velocity interpretation: <1.8 ft/sec, indicate of risk for recurrent falls   General Gait Details: Initially with RW for support, drifts towards Lt but able to correct with cues. Opted for IV pole as she felt frustrated with RW. Able to ambulate with IV pole supervision level. No LOB during bout. SpO2 92%, HR increased to 131 max, recovers rapidly upon sitting to baseline (101)  Stairs            Wheelchair Mobility     Tilt Bed    Modified Rankin (Stroke Patients Only)       Balance Overall balance assessment: Mild deficits observed, not formally tested                                           Pertinent Vitals/Pain Pain Assessment Pain Assessment: No/denies pain    Home Living Family/patient expects to be discharged to:: Assisted living                 Home Equipment: Rollator (4 wheels) Additional Comments: [facility]    Prior Function Prior Level of Function :  Needs assist             Mobility Comments: Ambulates with rollator, no assist. 1 fall in past 6 mo. ADLs Comments: States she is able to bath/dress herself; staff prepare meals.     Extremity/Trunk Assessment   Upper Extremity Assessment Upper Extremity Assessment: Defer to OT evaluation    Lower Extremity Assessment Lower Extremity Assessment: Generalized weakness       Communication   Communication Communication: No apparent difficulties    Cognition Arousal:  Alert Behavior During Therapy: Flat affect   PT - Cognitive impairments: No family/caregiver present to determine baseline, Orientation   Orientation impairments: Time, Situation                   PT - Cognition Comments: Oriented to self and location Following commands: Intact       Cueing Cueing Techniques: Verbal cues, Gestural cues     General Comments General comments (skin integrity, edema, etc.): SpO2 94% at rest on RA. 92% ambulating on RA. HR 101 at rest, up to 131 with gait.    Exercises General Exercises - Lower Extremity Ankle Circles/Pumps: AROM, Both, 10 reps, Supine Quad Sets: Strengthening, Both, 10 reps, Supine Gluteal Sets: Strengthening, Both, 10 reps, Supine   Assessment/Plan    PT Assessment Patient needs continued PT services  PT Problem List Decreased strength;Decreased activity tolerance;Decreased mobility;Decreased balance;Decreased cognition;Decreased knowledge of use of DME;Cardiopulmonary status limiting activity       PT Treatment Interventions DME instruction;Gait training;Functional mobility training;Therapeutic activities;Therapeutic exercise;Balance training;Neuromuscular re-education;Patient/family education;Cognitive remediation    PT Goals (Current goals can be found in the Care Plan section)  Acute Rehab PT Goals Patient Stated Goal: Return to ALF PT Goal Formulation: With patient Time For Goal Achievement: 01/12/24 Potential to Achieve Goals: Good    Frequency Min 2X/week     Co-evaluation               AM-PAC PT "6 Clicks" Mobility  Outcome Measure Help needed turning from your back to your side while in a flat bed without using bedrails?: None Help needed moving from lying on your back to sitting on the side of a flat bed without using bedrails?: None Help needed moving to and from a bed to a chair (including a wheelchair)?: None Help needed standing up from a chair using your arms (e.g., wheelchair or bedside  chair)?: A Little Help needed to walk in hospital room?: A Little Help needed climbing 3-5 steps with a railing? : A Little 6 Click Score: 21    End of Session Equipment Utilized During Treatment: Gait belt Activity Tolerance: Patient tolerated treatment well Patient left: in bed;with call bell/phone within reach;with bed alarm set Nurse Communication: Mobility status PT Visit Diagnosis: Unsteadiness on feet (R26.81);Other abnormalities of gait and mobility (R26.89);History of falling (Z91.81);Muscle weakness (generalized) (M62.81)    Time: 1610-9604 PT Time Calculation (min) (ACUTE ONLY): 28 min   Charges:   PT Evaluation $PT Eval Low Complexity: 1 Low PT Treatments $Gait Training: 8-22 mins PT General Charges $$ ACUTE PT VISIT: 1 Visit         Kathlyn Sacramento, PT, DPT [facility]  Rehabilitation Services Physical Therapist Office: [phone] Website: [email or web address]   Berton Mount 12/29/2023, 9:55 AM

## 2023-12-29 NOTE — Discharge Summary (Addendum)
 Physician Discharge Summary  Nickisha Hum NWG:[id number] DOB: [birth year] DOA: 12/25/2023  PCP: [facility]  Admit date: 12/25/2023 Discharge date: 12/29/2023  Admitted From: ALF  Discharge disposition: Assisted living facility    Recommendations for Outpatient Follow-Up:   Follow up with your primary care provider in one week.  Check CBC, BMP, magnesium in the next visit  Discharge Diagnosis:   Principal Problem:   COPD with acute exacerbation (HCC) Active Problems:   Schizophrenia, paranoid type (HCC)   HYPERTENSION, BENIGN ESSENTIAL   Memory disturbance   CAD (coronary artery disease)   Acute respiratory failure with hypoxia (HCC)   Influenza A   Discharge Condition: Improved.  Diet recommendation: Regular.  Wound care: None.  Code status: Full.   History of Present Illness:   73 year old with past medical history significant for hypertension, COPD, CAD, memory disturbance, schizoaffective disorder, depression, anxiety, presented to the hospital with acute respiratory distress, shortness of breath the day prior to admission.  EMS was contacted at her ALF and she was found to be in acute distress oxygen saturation 70% on room air.  Patient was placed on BiPAP in the ED.  Chest x-ray showed vascular congestion and ill-defined basilar opacity, favored to reflect atelectasis.  BNP 94 lactic acid 2.2 influenza A PCR positive.  Patient was then admitted hospital for further evaluation and treatment.   Hospital Course:   Following conditions were addressed during hospitalization as listed below,  Acute hypoxic respiratory failure, in the setting of influenza A and COPD exacerbation Patient had hypoxia with pulse ox of 70% on room air and was on BiPAP initially.  Currently on room air.  Tested positive for influenza.  Received Tamiflu Rocephin and Zithromax hospitalization and will continue Tamiflu to complete 5-day course on discharge.  Received IV  Solu-Medrol initially and will be transition to prednisone.  Continue bronchodilators on discharge.   -Lactic acidosis Improved.  Received IV fluids 2.  Likely secondary to work of breathing.  At this time patient is comfortable.   CAD: -Continue with Lipitor, metoprolol.  No acute issues during hospitalization.   Hypertension: Continue Norvasc and metoprolol   Schizoaffective disorder, depression, anxiety Continue Seroquel and Zoloft, Lyrica   Memory loss -Delirium precaution. B12 was low so supplementation has been initiated.   Unsteady feet.  Seen by physical therapy and did not recommend any skilled therapy needs on discharge.  Disposition.  At this time, patient is stable for disposition to assisted living facility.  Medical Consultants:   None.  Procedures:    None Subjective:   Today, patient was seen and examined at bedside.  Denies any nausea, vomiting, fever, chills or rigor.  Off oxygen.  Discharge Exam:   Vitals:   12/29/23 0826 12/29/23 1219  BP: 119/83 (!) 133/97  Pulse: 83 81  Resp: 16 20  Temp: 98.9 F (37.2 C) 99 F (37.2 C)  SpO2: 93% 96%   Vitals:   12/29/23 0700 12/29/23 0823 12/29/23 0826 12/29/23 1219  BP:  (!) 116/100 119/83 (!) 133/97  Pulse: 82 81 83 81  Resp: 15 16 16 20   Temp:  98.9 F (37.2 C) 98.9 F (37.2 C) 99 F (37.2 C)  TempSrc:  Oral  Oral  SpO2: 96% 94% 93% 96%  Weight:      Height:       Body mass index is 27.44 kg/m.   General: Alert awake, not in obvious distress, on room air HENT: pupils equally reacting to light,  No  scleral pallor or icterus noted. Oral mucosa is moist.  Chest:   Diminished breath sounds bilaterally.  CVS: S1 &S2 heard. No murmur.  Regular rate and rhythm. Abdomen: Soft, nontender, nondistended.  Bowel sounds are heard.   Extremities: No cyanosis, clubbing or edema.  Peripheral pulses are palpable. Psych: Alert, awake and oriented, Communicative CNS:  No cranial nerve deficits.  Power equal  in all extremities.   Skin: Warm and dry.  No rashes noted.  The results of significant diagnostics from this hospitalization (including imaging, microbiology, ancillary and laboratory) are listed below for reference.     Diagnostic Studies:   CT ABDOMEN PELVIS W CONTRAST Result Date: 12/26/2023 CLINICAL DATA:  Sepsis and respiratory distress. EXAM: CT ABDOMEN AND PELVIS WITH CONTRAST TECHNIQUE: Multidetector CT imaging of the abdomen and pelvis was performed using the standard protocol following bolus administration of intravenous contrast. RADIATION DOSE REDUCTION: This exam was performed according to the departmental dose-optimization program which includes automated exposure control, adjustment of the mA and/or kV according to patient size and/or use of iterative reconstruction technique. CONTRAST:  75mL OMNIPAQUE IOHEXOL 350 MG/ML SOLN COMPARISON:  CT with IV contrast 05/03/2022. FINDINGS: Lower chest: Lung bases are clear. There is mild cardiomegaly. No pericardial effusion. There is a small hiatal hernia. Hepatobiliary: The liver is 20 cm length, mildly steatotic without mass enhancement. The gallbladder and bile ducts are unremarkable. Pancreas: No abnormality. Spleen: No abnormality.  No splenomegaly. Adrenals/Urinary Tract: Adrenal glands are unremarkable. Kidneys are normal, without renal calculi, focal lesion, or hydronephrosis. Bladder is unremarkable. Stomach/Bowel: There are thickened folds in the stomach antrum but less thickened than previously. The small bowel is normal caliber.  The appendix is normal. There is scattered colonic diverticulosis without evidence of diverticulitis or colitis. Vascular/Lymphatic: Aortic atherosclerosis. No enlarged abdominal or pelvic lymph nodes. Multiple pelvic phleboliths. Reproductive: Status post hysterectomy. No adnexal masses. Other: Mild rectus diastasis at the umbilicus is again noted and a small umbilical fat hernia. There is no incarcerated hernia.  There is no free fluid, free hemorrhage or free air. Musculoskeletal: There are degenerative changes of the lumbar spine. No acute or significant osseous findings. Calcified injection granulomas both buttocks. IMPRESSION: 1. No acute CT findings in the abdomen or pelvis. 2. Small hiatal hernia. 3. Mildly enlarged steatotic liver. 4. Diverticulosis without evidence of diverticulitis. 5. Aortic atherosclerosis.  Mild cardiomegaly. 6. Thickening of the folds in the stomach antrum but less thickened than previously. 7. Small umbilical fat hernia and mild rectus diastasis at the umbilicus. Aortic Atherosclerosis (ICD10-I70.0). Electronically Signed   By: Almira Bar M.D.   On: 12/26/2023 05:48   DG Chest Port 1 View Result Date: 12/25/2023 CLINICAL DATA:  Respiratory distress. EXAM: PORTABLE CHEST 1 VIEW COMPARISON:  08/10/2023 FINDINGS: The heart is upper normal in size. Stable mediastinal contours. Vascular congestion. Ill-defined opacity at the lung bases, favor atelectasis. No significant pleural effusion. No pneumothorax. No acute osseous findings. IMPRESSION: 1. Vascular congestion. 2. Ill-defined opacity at the lung bases, favor atelectasis. Electronically Signed   By: Narda Rutherford M.D.   On: 12/25/2023 22:12     Labs:   Basic Metabolic Panel: Recent Labs  Lab 12/25/23 2158 12/26/23 0430 12/27/23 0534 12/28/23 0425 12/29/23 0445  NA 135 136 139 136 136  K 4.2 3.4* 4.2 4.4 4.2  CL 96* 104 108 101 104  CO2 24 17* 22 24 20*  GLUCOSE 137* 179* 154* 120* 131*  BUN 12 10 11 18 23   CREATININE 1.07*  1.17* 1.05* 0.75 0.92  CALCIUM 9.6 8.6* 9.6 10.0 10.2   GFR Estimated Creatinine Clearance: 51.5 mL/min (by C-G formula based on SCr of 0.92 mg/dL). Liver Function Tests: Recent Labs  Lab 12/25/23 2158 12/27/23 0534  AST 26 39  ALT 13 18  ALKPHOS 61 51  BILITOT 0.7 0.4  PROT 6.9 6.3*  ALBUMIN 3.9 3.2*   No results for input(s): "LIPASE", "AMYLASE" in the last 168 hours. No results  for input(s): "AMMONIA" in the last 168 hours. Coagulation profile Recent Labs  Lab 12/25/23 2219  INR 1.1    CBC: Recent Labs  Lab 12/25/23 2158 12/26/23 0430 12/27/23 0431 12/28/23 0425 12/29/23 0445  WBC 9.0 9.0 13.8* 11.4* 8.8  NEUTROABS 7.1  --   --   --   --   HGB 13.1 12.4 12.0 13.4 15.1*  HCT 39.9 37.7 35.2* 39.6 44.1  MCV 92.6 93.3 88.4 88.2 87.0  PLT 224 219 188 254 272   Cardiac Enzymes: No results for input(s): "CKTOTAL", "CKMB", "CKMBINDEX", "TROPONINI" in the last 168 hours. BNP: Invalid input(s): "POCBNP" CBG: No results for input(s): "GLUCAP" in the last 168 hours. D-Dimer No results for input(s): "DDIMER" in the last 72 hours. Hgb A1c No results for input(s): "HGBA1C" in the last 72 hours. Lipid Profile No results for input(s): "CHOL", "HDL", "LDLCALC", "TRIG", "CHOLHDL", "LDLDIRECT" in the last 72 hours. Thyroid function studies No results for input(s): "TSH", "T4TOTAL", "T3FREE", "THYROIDAB" in the last 72 hours.  Invalid input(s): "FREET3" Anemia work up Recent Labs    12/27/23 0534  [id number]   Microbiology Recent Results (from the past 240 hours)  Blood Culture (routine x 2)     Status: None (Preliminary result)   Collection Time: 12/25/23  9:55 PM   Specimen: BLOOD LEFT FOREARM  Result Value Ref Range Status   Specimen Description BLOOD LEFT FOREARM  Final   Special Requests   Final    BOTTLES DRAWN AEROBIC AND ANAEROBIC Blood Culture results may not be optimal due to an inadequate volume of blood received in culture bottles   Culture   Final    NO GROWTH 4 DAYS Performed at [facility], [address] [place], [place] [address]    Report Status PENDING  Incomplete  Resp panel by RT-PCR (RSV, Flu A&B, Covid) Anterior Nasal Swab     Status: Abnormal   Collection Time: 12/25/23 10:04 PM   Specimen: Anterior Nasal Swab  Result Value Ref Range Status   SARS Coronavirus 2 by RT PCR NEGATIVE NEGATIVE Final   Influenza A by  PCR POSITIVE (A) NEGATIVE Final   Influenza B by PCR NEGATIVE NEGATIVE Final    Comment: (NOTE) The Xpert Xpress SARS-CoV-2/FLU/RSV plus assay is intended as an aid in the diagnosis of influenza from Nasopharyngeal swab specimens and should not be used as a sole basis for treatment. Nasal washings and aspirates are unacceptable for Xpert Xpress SARS-CoV-2/FLU/RSV testing.  Fact Sheet for Patients: BloggerCourse.com  Fact Sheet for Healthcare Providers: SeriousBroker.it  This test is not yet approved or cleared by the [place] FDA and has been authorized for detection and/or diagnosis of SARS-CoV-2 by FDA under an Emergency Use Authorization (EUA). This EUA will remain in effect (meaning this test can be used) for the duration of the COVID-19 declaration under Section 564(b)(1) of the Act, 21 U.S.C. section 360bbb-3(b)(1), unless the authorization is terminated or revoked.     Resp Syncytial Virus by PCR NEGATIVE NEGATIVE Final  Comment: (NOTE) Fact Sheet for Patients: BloggerCourse.com  Fact Sheet for Healthcare Providers: SeriousBroker.it  This test is not yet approved or cleared by the [place] FDA and has been authorized for detection and/or diagnosis of SARS-CoV-2 by FDA under an Emergency Use Authorization (EUA). This EUA will remain in effect (meaning this test can be used) for the duration of the COVID-19 declaration under Section 564(b)(1) of the Act, 21 U.S.C. section 360bbb-3(b)(1), unless the authorization is terminated or revoked.  Performed at [facility], [address] [place], [place] [address]   Blood Culture (routine x 2)     Status: None (Preliminary result)   Collection Time: 12/25/23 10:56 PM   Specimen: BLOOD RIGHT HAND  Result Value Ref Range Status   Specimen Description BLOOD RIGHT HAND  Final   Special Requests   Final     BOTTLES DRAWN AEROBIC AND ANAEROBIC Blood Culture adequate volume   Culture   Final    NO GROWTH 4 DAYS Performed at [facility], [address] [place], [place] [address]    Report Status PENDING  Incomplete     Discharge Instructions:   Discharge Instructions     Call MD for:  temperature >100.4   Complete by: As directed    Diet general   Complete by: As directed    Discharge instructions   Complete by: As directed    Follow-up with your primary care provider in 1 week.  Check blood work at that time.  Seek medical attention for worsening symptoms.  No overexertion.   Increase activity slowly   Complete by: As directed       Allergies as of 12/29/2023       Reactions   Clonazepam Swelling, Other (See Comments)   Pt was recently given a triamterene hydrochlorothiazide combination as well as clonazepam and had an allergic reaction to one of them. Caused swelling of face and eyes   Doxycycline Swelling, Other (See Comments)   Swelling of face and eyes   Fosinopril Sodium Swelling, Other (See Comments)   Swelling face,lips-angioedema from ACE I   Hydrochlorothiazide W-triamterene Swelling   Pt was recently given a triamterene hydrochlorothiazide combination as well as clonazepam and had an allergic reaction to one of them. Swelling of face and eyes   Penicillins Swelling, Other (See Comments)   Face and eyes Has patient had a PCN reaction causing immediate rash, facial/tongue/throat swelling, SOB or lightheadedness with hypotension: Yes Has patient had a PCN reaction causing severe rash involving mucus membranes or skin necrosis: No Has patient had a PCN reaction that required hospitalization: No Has patient had a PCN reaction occurring within the last 10 years: No If all of the above answers are "NO", then may proceed with Cephalosporin use.   Sulfa Antibiotics Other (See Comments)   "Allergic," per [facility]   Triamterene Other (See Comments)   Pt was recently given  a triamterene hydrochlorothiazide combination as well as clonazepam and had an allergic reaction to one of them. "Allergic," per [facility]        Medication List     STOP taking these medications    budesonide-formoterol 160-4.5 MCG/ACT inhaler Commonly known as: Symbicort       TAKE these medications    albuterol (2.5 MG/3ML) 0.083% nebulizer solution Commonly known as: PROVENTIL Take 2.5 mg by nebulization every 6 (six) hours as needed for shortness of breath.   albuterol 108 (90 Base) MCG/ACT inhaler Commonly known as: VENTOLIN  HFA Inhale 1-2 puffs into the lungs every 4 (four) hours as needed for wheezing or shortness of breath (or cough).   amLODipine 5 MG tablet Commonly known as: NORVASC Take 5 mg by mouth daily.   atorvastatin 10 MG tablet Commonly known as: LIPITOR Take 10 mg by mouth daily.   busPIRone 15 MG tablet Commonly known as: BUSPAR Take 15 mg by mouth 3 (three) times daily.   cyanocobalamin 500 MCG tablet Commonly known as: VITAMIN B12 Take 2 tablets (1,000 mcg total) by mouth daily. Start taking on: December 30, 2023   DHEA 25 MG Caps Take 25 mg by mouth in the morning.   guaiFENesin 600 MG 12 hr tablet Commonly known as: MUCINEX Take 2 tablets (1,200 mg total) by mouth 2 (two) times daily for 15 days.   HYDROcodone-acetaminophen 10-325 MG tablet Commonly known as: NORCO Take 1 tablet by mouth See admin instructions. Give 1 tablet by mouth 5 time a day as needed   hydrOXYzine 25 MG capsule Commonly known as: VISTARIL Take 25 mg by mouth 2 (two) times daily as needed for anxiety.   Ingrezza 80 MG capsule Generic drug: valbenazine Take 1 capsule (80 mg total) by mouth daily.   latanoprost 0.005 % ophthalmic solution Commonly known as: XALATAN Place 1 drop into both eyes at bedtime.   loratadine 10 MG tablet Commonly known as: CLARITIN Take 10 mg by mouth daily.   metoprolol tartrate 25 MG tablet Commonly known as: LOPRESSOR Take 1  tablet (25 mg total) by mouth 2 (two) times daily.   nicotine 21 mg/24hr patch Commonly known as: NICODERM CQ - dosed in mg/24 hours Place 21 mg onto the skin daily.   oseltamivir 30 MG capsule Commonly known as: TAMIFLU Take 1 capsule (30 mg total) by mouth 2 (two) times daily for 2 days.   predniSONE 10 MG tablet Commonly known as: DELTASONE Take 3 tablets (30 mg total) by mouth daily for 3 days.   pregabalin 75 MG capsule Commonly known as: LYRICA Take 75 mg by mouth 3 (three) times daily.   QUEtiapine 200 MG tablet Commonly known as: SEROQUEL Take 200 mg by mouth at bedtime.   sertraline 100 MG tablet Commonly known as: ZOLOFT Take 100 mg by mouth daily.   tiZANidine 2 MG tablet Commonly known as: ZANAFLEX Take 2 mg by mouth every 8 (eight) hours as needed for muscle spasms.   Trelegy Ellipta 200-62.5-25 MCG/ACT Aepb Generic drug: Fluticasone-Umeclidin-Vilant Inhale 1 puff into the lungs daily.   triamcinolone cream 0.1 % Commonly known as: KENALOG Apply 1 g topically See admin instructions. Apply (1 grams) green pea-sized bead to popliteal fossae twice a day   Vitamin D3 250 MCG (10000 UT) Tabs Take 10,000 Units by mouth daily.        Follow-up Information     [facility] Follow up in 1 week(s).   Contact information: [address] [place] [address] [phone]                  Time coordinating discharge: 39 minutes  Signed:  Ireoluwa Gorsline  [facility] 12/29/2023, 2:13 PM

## 2023-12-30 LAB — CULTURE, BLOOD (ROUTINE X 2)
Culture: NO GROWTH
Culture: NO GROWTH
Special Requests: ADEQUATE

## 2024-04-06 ENCOUNTER — Other Ambulatory Visit: Payer: Self-pay

## 2024-04-06 ENCOUNTER — Emergency Department (HOSPITAL_COMMUNITY)
Admission: EM | Admit: 2024-04-06 | Discharge: 2024-04-06 | Disposition: A | Discharge status: Home / Self Care | Attending: Emergency Medicine | Admitting: Emergency Medicine

## 2024-04-06 DIAGNOSIS — I2510 Atherosclerotic heart disease of native coronary artery without angina pectoris: Secondary | ICD-10-CM | POA: Diagnosis not present

## 2024-04-06 DIAGNOSIS — Z79899 Other long term (current) drug therapy: Secondary | ICD-10-CM | POA: Insufficient documentation

## 2024-04-06 DIAGNOSIS — J449 Chronic obstructive pulmonary disease, unspecified: Secondary | ICD-10-CM | POA: Insufficient documentation

## 2024-04-06 DIAGNOSIS — I10 Essential (primary) hypertension: Secondary | ICD-10-CM | POA: Diagnosis not present

## 2024-04-06 DIAGNOSIS — Z7951 Long term (current) use of inhaled steroids: Secondary | ICD-10-CM | POA: Diagnosis not present

## 2024-04-06 DIAGNOSIS — K047 Periapical abscess without sinus: Secondary | ICD-10-CM | POA: Insufficient documentation

## 2024-04-06 DIAGNOSIS — K0889 Other specified disorders of teeth and supporting structures: Secondary | ICD-10-CM | POA: Diagnosis present

## 2024-04-06 MED ORDER — OXYCODONE-ACETAMINOPHEN 5-325 MG PO TABS
1.0000 | ORAL_TABLET | Freq: Once | ORAL | Status: AC
  Administered 2024-04-06: 1 via ORAL
  Filled 2024-04-06: qty 1

## 2024-04-06 MED ORDER — CLINDAMYCIN HCL 300 MG PO CAPS: 300.0000 mg | ORAL_CAPSULE | Freq: Three times a day (TID) | ORAL | 0 refills | Status: AC

## 2024-04-06 MED ORDER — CLINDAMYCIN HCL 300 MG PO CAPS
300.0000 mg | ORAL_CAPSULE | Freq: Once | ORAL | Status: AC
  Administered 2024-04-06: 300 mg via ORAL
  Filled 2024-04-06: qty 1

## 2024-04-06 NOTE — Discharge Instructions (Signed)
 Gargle with salt water 3 times a day after eating.  You can also use a heating pad

## 2024-04-06 NOTE — ED Provider Notes (Signed)
 [place] EMERGENCY DEPARTMENT AT [facility] Provider Note   CSN: [id number] Arrival date & time: 04/06/24  1216     Patient presents with: Dental Pain   Danielle [name] is a 73 y.o. female.   Patient is a 73 year old female with a history of CAD, tardive dyskinesia, schizophrenia, hypertension, COPD who is presenting today with dental pain that is been ongoing for the last 1 week with some mild facial swelling.  She has no difficulty swallowing or eating.  She just reports the pain is very uncomfortable and it is not being treated by her oral hydrocodone .  She also does not have a dentist.  The history is provided by the patient.  Dental Pain      Prior to Admission medications   Medication Sig Start Date End Date Taking? Authorizing Provider  clindamycin (CLEOCIN) 300 MG capsule Take 1 capsule (300 mg total) by mouth 3 (three) times daily for 7 days. 04/06/24 04/13/24 Yes Keylah Darwish, Laurina Popper, MD  albuterol  (PROVENTIL ) (2.5 MG/3ML) 0.083% nebulizer solution Take 2.5 mg by nebulization every 6 (six) hours as needed for shortness of breath.    [provider]  albuterol  (VENTOLIN  HFA) 108 (90 Base) MCG/ACT inhaler Inhale 1-2 puffs into the lungs every 4 (four) hours as needed for wheezing or shortness of breath (or cough). 10/19/22   Audria Leather, MD  amLODipine  (NORVASC ) 5 MG tablet Take 5 mg by mouth daily.    [provider]  atorvastatin  (LIPITOR) 10 MG tablet Take 10 mg by mouth daily.    [provider]  busPIRone (BUSPAR) 15 MG tablet Take 15 mg by mouth 3 (three) times daily. 12/21/23   [provider]  Cholecalciferol (VITAMIN D3) 250 MCG (10000 UT) TABS Take 10,000 Units by mouth daily.    [provider]  cyanocobalamin  (VITAMIN B12) 500 MCG tablet Take 2 tablets (1,000 mcg total) by mouth daily. 12/30/23 04/08/24  Pokhrel, Laxman, MD  DHEA 25 MG CAPS Take 25 mg by mouth in the morning.    [provider]   HYDROcodone -acetaminophen  (NORCO) 10-325 MG tablet Take 1 tablet by mouth See admin instructions. Give 1 tablet by mouth 5 time a day as needed 12/29/23 12/28/24  Pokhrel, Laxman, MD  hydrOXYzine  (VISTARIL ) 25 MG capsule Take 25 mg by mouth 2 (two) times daily as needed for anxiety.    [provider]  latanoprost  (XALATAN ) 0.005 % ophthalmic solution Place 1 drop into both eyes at bedtime.    [provider]  loratadine  (CLARITIN ) 10 MG tablet Take 10 mg by mouth daily.    [provider]  metoprolol  tartrate (LOPRESSOR ) 25 MG tablet Take 1 tablet (25 mg total) by mouth 2 (two) times daily. 08/15/23   Macdonald Savoy, MD  nicotine  (NICODERM CQ  - DOSED IN MG/24 HOURS) 21 mg/24hr patch Place 21 mg onto the skin daily.    [provider]  pregabalin  (LYRICA ) 75 MG capsule Take 1 capsule (75 mg total) by mouth 3 (three) times daily. 12/29/23 12/28/24  Pokhrel, Laxman, MD  QUEtiapine  (SEROQUEL ) 200 MG tablet Take 200 mg by mouth at bedtime. 05/06/22   [provider]  sertraline  (ZOLOFT ) 100 MG tablet Take 100 mg by mouth daily.    [provider]  tiZANidine  (ZANAFLEX ) 2 MG tablet Take 2 mg by mouth every 8 (eight) hours as needed for muscle spasms.    [provider]  Gaylan Kaufman 200-62.5-25 MCG/ACT AEPB Inhale 1 puff into the lungs daily.  12/05/23   [provider]  triamcinolone cream (KENALOG) 0.1 % Apply 1 g topically See admin instructions. Apply (1 grams) green pea-sized bead to popliteal fossae twice a day 04/27/22   [provider]  valbenazine  (INGREZZA ) 80 MG capsule Take 1 capsule (80 mg total) by mouth daily. 05/26/23       Allergies: Clonazepam, Doxycycline , Fosinopril sodium, Hydrochlorothiazide -triamterene , Penicillins, Sulfa antibiotics, and Triamterene     Review of Systems  Updated Vital Signs BP 116/71 (BP Location: Left Arm)   Pulse 75   Temp 98.3 F (36.8 C) (Oral)   Resp 14   Ht 5' 2 (1.575  m)   SpO2 100%   BMI 27.44 kg/m   Physical Exam Vitals and nursing note reviewed.  Constitutional:      General: She is not in acute distress.    Appearance: She is well-developed.  HENT:     Head: Normocephalic and atraumatic.      Mouth/Throat:    Eyes:     Conjunctiva/sclera: Conjunctivae normal.     Pupils: Pupils are equal, round, and reactive to light.   Neck:     Comments: No swelling of the anterior neck.  No voice changes.  Minimal cervical adenopathy on the right Cardiovascular:     Rate and Rhythm: Normal rate and regular rhythm.     Heart sounds: No murmur heard. Pulmonary:     Effort: Pulmonary effort is normal. No respiratory distress.     Breath sounds: Normal breath sounds. No wheezing or rales.  Abdominal:     General: There is no distension.     Palpations: Abdomen is soft.     Tenderness: There is no abdominal tenderness. There is no guarding or rebound.   Musculoskeletal:        General: No tenderness. Normal range of motion.     Cervical back: Normal range of motion and neck supple.   Skin:    General: Skin is warm and dry.     Findings: No erythema or rash.   Neurological:     Mental Status: She is alert and oriented to person, place, and time. Mental status is at baseline.   Psychiatric:        Behavior: Behavior normal.     (all labs ordered are listed, but only abnormal results are displayed) Labs Reviewed - No data to display  EKG: None  Radiology: No results found.   Procedures   Medications Ordered in the ED  clindamycin (CLEOCIN) capsule 300 mg (300 mg Oral Given 04/06/24 1252)  oxyCODONE -acetaminophen  (PERCOCET/ROXICET) 5-325 MG per tablet 1 tablet (1 tablet Oral Given 04/06/24 1252)                                    Medical Decision Making Risk Prescription drug management.   Pt with dental caries and facial swelling.  No signs of ludwig's angina or difficulty swallowing and no systemic symptoms. Will treat with  clinda due to PCN allergy and have pt f/u with dentist.      Final diagnoses:  Dental abscess    ED Discharge Orders          Ordered    clindamycin (CLEOCIN) 300 MG capsule  3 times daily        04/06/24 1257               Almond Army, MD 04/06/24 1258

## 2024-04-06 NOTE — ED Notes (Signed)
 Pt called for a ride who arrived. Pt taken to vehicle via w/c.

## 2024-04-06 NOTE — ED Notes (Signed)
 [facility] called for transport

## 2024-04-06 NOTE — ED Notes (Signed)
 [facility] called and setup transportation, [facility] called about patient discharge

## 2024-04-06 NOTE — ED Notes (Signed)
 Patient ambulated to the bathroom.

## 2024-04-06 NOTE — ED Triage Notes (Signed)
 Pt BIB EMS coming from [name] c/o right lower jaw pain. Pain started last weekend. Hurts to eat, no drainage and tender to touch.  Ems:  Bp 130/70, Spo2 99%, HR 80

## 2024-08-28 ENCOUNTER — Inpatient Hospital Stay (HOSPITAL_COMMUNITY)
Admission: EM | Admit: 2024-08-28 | Discharge: 2024-09-01 | DRG: 916 | Disposition: A | Discharge status: Home / Self Care | Attending: Internal Medicine | Admitting: Internal Medicine

## 2024-08-28 ENCOUNTER — Emergency Department (HOSPITAL_COMMUNITY)

## 2024-08-28 ENCOUNTER — Encounter (HOSPITAL_COMMUNITY): Payer: Self-pay

## 2024-08-28 DIAGNOSIS — R220 Localized swelling, mass and lump, head: Secondary | ICD-10-CM | POA: Diagnosis present

## 2024-08-28 DIAGNOSIS — G2401 Drug induced subacute dyskinesia: Secondary | ICD-10-CM | POA: Diagnosis present

## 2024-08-28 DIAGNOSIS — E785 Hyperlipidemia, unspecified: Secondary | ICD-10-CM | POA: Diagnosis present

## 2024-08-28 DIAGNOSIS — R682 Dry mouth, unspecified: Secondary | ICD-10-CM | POA: Diagnosis not present

## 2024-08-28 DIAGNOSIS — Z7951 Long term (current) use of inhaled steroids: Secondary | ICD-10-CM

## 2024-08-28 DIAGNOSIS — N179 Acute kidney failure, unspecified: Secondary | ICD-10-CM | POA: Diagnosis not present

## 2024-08-28 DIAGNOSIS — R4781 Slurred speech: Principal | ICD-10-CM

## 2024-08-28 DIAGNOSIS — T783XXA Angioneurotic edema, initial encounter: Principal | ICD-10-CM | POA: Diagnosis present

## 2024-08-28 DIAGNOSIS — Z881 Allergy status to other antibiotic agents status: Secondary | ICD-10-CM

## 2024-08-28 DIAGNOSIS — F331 Major depressive disorder, recurrent, moderate: Secondary | ICD-10-CM | POA: Diagnosis not present

## 2024-08-28 DIAGNOSIS — F17210 Nicotine dependence, cigarettes, uncomplicated: Secondary | ICD-10-CM | POA: Diagnosis present

## 2024-08-28 DIAGNOSIS — Z880 Allergy status to penicillin: Secondary | ICD-10-CM

## 2024-08-28 DIAGNOSIS — F319 Bipolar disorder, unspecified: Secondary | ICD-10-CM | POA: Diagnosis present

## 2024-08-28 DIAGNOSIS — Z79899 Other long term (current) drug therapy: Secondary | ICD-10-CM

## 2024-08-28 DIAGNOSIS — R413 Other amnesia: Secondary | ICD-10-CM | POA: Diagnosis present

## 2024-08-28 DIAGNOSIS — Z823 Family history of stroke: Secondary | ICD-10-CM

## 2024-08-28 DIAGNOSIS — K219 Gastro-esophageal reflux disease without esophagitis: Secondary | ICD-10-CM | POA: Diagnosis present

## 2024-08-28 DIAGNOSIS — K148 Other diseases of tongue: Secondary | ICD-10-CM

## 2024-08-28 DIAGNOSIS — F259 Schizoaffective disorder, unspecified: Secondary | ICD-10-CM | POA: Diagnosis present

## 2024-08-28 DIAGNOSIS — R531 Weakness: Secondary | ICD-10-CM | POA: Diagnosis present

## 2024-08-28 DIAGNOSIS — I2510 Atherosclerotic heart disease of native coronary artery without angina pectoris: Secondary | ICD-10-CM | POA: Diagnosis present

## 2024-08-28 DIAGNOSIS — Z882 Allergy status to sulfonamides status: Secondary | ICD-10-CM

## 2024-08-28 DIAGNOSIS — J449 Chronic obstructive pulmonary disease, unspecified: Secondary | ICD-10-CM | POA: Diagnosis present

## 2024-08-28 DIAGNOSIS — Z8673 Personal history of transient ischemic attack (TIA), and cerebral infarction without residual deficits: Secondary | ICD-10-CM

## 2024-08-28 DIAGNOSIS — X58XXXA Exposure to other specified factors, initial encounter: Secondary | ICD-10-CM | POA: Diagnosis present

## 2024-08-28 DIAGNOSIS — J4489 Other specified chronic obstructive pulmonary disease: Secondary | ICD-10-CM | POA: Diagnosis present

## 2024-08-28 DIAGNOSIS — R5383 Other fatigue: Secondary | ICD-10-CM | POA: Diagnosis not present

## 2024-08-28 DIAGNOSIS — Z8249 Family history of ischemic heart disease and other diseases of the circulatory system: Secondary | ICD-10-CM

## 2024-08-28 DIAGNOSIS — F411 Generalized anxiety disorder: Secondary | ICD-10-CM | POA: Diagnosis present

## 2024-08-28 DIAGNOSIS — I10 Essential (primary) hypertension: Secondary | ICD-10-CM | POA: Diagnosis not present

## 2024-08-28 DIAGNOSIS — Z888 Allergy status to other drugs, medicaments and biological substances status: Secondary | ICD-10-CM

## 2024-08-28 DIAGNOSIS — Z9079 Acquired absence of other genital organ(s): Secondary | ICD-10-CM

## 2024-08-28 LAB — I-STAT VENOUS BLOOD GAS, ED
Acid-Base Excess: 1 mmol/L (ref 0.0–2.0)
Bicarbonate: 25.5 mmol/L (ref 20.0–28.0)
Calcium, Ion: 1.22 mmol/L (ref 1.15–1.40)
HCT: 39 % (ref 36.0–46.0)
Hemoglobin: 13.3 g/dL (ref 12.0–15.0)
O2 Saturation: 94 %
Potassium: 3.5 mmol/L (ref 3.5–5.1)
Sodium: 141 mmol/L (ref 135–145)
TCO2: 27 mmol/L (ref 22–32)
pCO2, Ven: 39.9 mmHg — ABNORMAL LOW (ref 44–60)
pH, Ven: 7.413 (ref 7.25–7.43)
pO2, Ven: 71 mmHg — ABNORMAL HIGH (ref 32–45)

## 2024-08-28 LAB — I-STAT CHEM 8, ED
BUN: 31 mg/dL — ABNORMAL HIGH (ref 8–23)
Calcium, Ion: 1.2 mmol/L (ref 1.15–1.40)
Chloride: 105 mmol/L (ref 98–111)
Creatinine, Ser: 1.8 mg/dL — ABNORMAL HIGH (ref 0.44–1.00)
Glucose, Bld: 132 mg/dL — ABNORMAL HIGH (ref 70–99)
HCT: 40 % (ref 36.0–46.0)
Hemoglobin: 13.6 g/dL (ref 12.0–15.0)
Potassium: 3.5 mmol/L (ref 3.5–5.1)
Sodium: 140 mmol/L (ref 135–145)
TCO2: 24 mmol/L (ref 22–32)

## 2024-08-28 LAB — APTT: aPTT: 30 s (ref 24–36)

## 2024-08-28 LAB — PROTIME-INR
INR: 1 (ref 0.8–1.2)
Prothrombin Time: 14.1 s (ref 11.4–15.2)

## 2024-08-28 LAB — COMPREHENSIVE METABOLIC PANEL WITH GFR
ALT: 12 U/L (ref 0–44)
AST: 17 U/L (ref 15–41)
Albumin: 3.7 g/dL (ref 3.5–5.0)
Alkaline Phosphatase: 60 U/L (ref 38–126)
Anion gap: 15 (ref 5–15)
BUN: 30 mg/dL — ABNORMAL HIGH (ref 8–23)
CO2: 22 mmol/L (ref 22–32)
Calcium: 9.6 mg/dL (ref 8.9–10.3)
Chloride: 103 mmol/L (ref 98–111)
Creatinine, Ser: 1.69 mg/dL — ABNORMAL HIGH (ref 0.44–1.00)
GFR, Estimated: 32 mL/min — ABNORMAL LOW (ref >60)
Glucose, Bld: 123 mg/dL — ABNORMAL HIGH (ref 70–99)
Potassium: 3.6 mmol/L (ref 3.5–5.1)
Sodium: 140 mmol/L (ref 135–145)
Total Bilirubin: 0.5 mg/dL (ref 0.0–1.2)
Total Protein: 6.2 g/dL — ABNORMAL LOW (ref 6.5–8.1)

## 2024-08-28 LAB — CBC WITH DIFFERENTIAL/PLATELET
Abs Immature Granulocytes: 0.02 K/uL (ref 0.00–0.07)
Basophils Absolute: 0.1 K/uL (ref 0.0–0.1)
Basophils Relative: 1 %
Eosinophils Absolute: 0.3 K/uL (ref 0.0–0.5)
Eosinophils Relative: 5 %
HCT: 38.8 % (ref 36.0–46.0)
Hemoglobin: 12.9 g/dL (ref 12.0–15.0)
Immature Granulocytes: 0 %
Lymphocytes Relative: 41 %
Lymphs Abs: 2.7 K/uL (ref 0.7–4.0)
MCH: 30.4 pg (ref 26.0–34.0)
MCHC: 33.2 g/dL (ref 30.0–36.0)
MCV: 91.3 fL (ref 80.0–100.0)
Monocytes Absolute: 0.8 K/uL (ref 0.1–1.0)
Monocytes Relative: 12 %
Neutro Abs: 2.7 K/uL (ref 1.7–7.7)
Neutrophils Relative %: 41 %
Platelets: 242 K/uL (ref 150–400)
RBC: 4.25 MIL/uL (ref 3.87–5.11)
RDW: 13.2 % (ref 11.5–15.5)
WBC: 6.5 K/uL (ref 4.0–10.5)
nRBC: 0 % (ref 0.0–0.2)

## 2024-08-28 LAB — ETHANOL: Alcohol, Ethyl (B): <15 mg/dL (ref <15)

## 2024-08-28 LAB — CBG MONITORING, ED: Glucose-Capillary: 130 mg/dL — ABNORMAL HIGH (ref 70–99)

## 2024-08-28 LAB — I-STAT CG4 LACTIC ACID, ED: Lactic Acid, Venous: 1.9 mmol/L (ref 0.5–1.9)

## 2024-08-28 MED ORDER — ALBUTEROL SULFATE (2.5 MG/3ML) 0.083% IN NEBU
2.5000 mg | INHALATION_SOLUTION | Freq: Four times a day (QID) | RESPIRATORY_TRACT | Status: DC | PRN
Start: 2024-08-28 — End: 2024-09-01
  Administered 2024-08-30: 2.5 mg via RESPIRATORY_TRACT
  Filled 2024-08-28: qty 3

## 2024-08-28 MED ORDER — ATORVASTATIN CALCIUM 10 MG PO TABS
10.0000 mg | ORAL_TABLET | Freq: Every day | ORAL | Status: DC
  Administered 2024-08-29 – 2024-09-01 (×4): 10 mg via ORAL
  Filled 2024-08-28 (×4): qty 1

## 2024-08-28 MED ORDER — ONDANSETRON HCL 4 MG/2ML IJ SOLN
4.0000 mg | Freq: Four times a day (QID) | INTRAMUSCULAR | Status: DC | PRN
Start: 2024-08-28 — End: 2024-09-01

## 2024-08-28 MED ORDER — BUDESON-GLYCOPYRROL-FORMOTEROL 160-9-4.8 MCG/ACT IN AERO
2.0000 | INHALATION_SPRAY | Freq: Two times a day (BID) | RESPIRATORY_TRACT | Status: DC
  Administered 2024-08-29 – 2024-09-01 (×5): 2 via RESPIRATORY_TRACT
  Filled 2024-08-28 (×2): qty 5.9

## 2024-08-28 MED ORDER — SODIUM CHLORIDE 0.9% FLUSH
3.0000 mL | Freq: Two times a day (BID) | INTRAVENOUS | Status: DC
  Administered 2024-08-29 – 2024-09-01 (×7): 3 mL via INTRAVENOUS

## 2024-08-28 MED ORDER — SENNA 8.6 MG PO TABS: 1.0000 | ORAL_TABLET | Freq: Every day | ORAL | Status: DC | PRN

## 2024-08-28 MED ORDER — POTASSIUM CHLORIDE IN NACL 20-0.9 MEQ/L-% IV SOLN
INTRAVENOUS | Status: AC
  Filled 2024-08-28: qty 1000

## 2024-08-28 MED ORDER — BUSPIRONE HCL 5 MG PO TABS
15.0000 mg | ORAL_TABLET | Freq: Three times a day (TID) | ORAL | Status: DC
  Administered 2024-08-29 – 2024-09-01 (×10): 15 mg via ORAL
  Filled 2024-08-28 (×8): qty 1
  Filled 2024-08-28: qty 2
  Filled 2024-08-28: qty 1

## 2024-08-28 MED ORDER — SERTRALINE HCL 100 MG PO TABS
100.0000 mg | ORAL_TABLET | Freq: Every day | ORAL | Status: DC
  Administered 2024-08-29 – 2024-09-01 (×4): 100 mg via ORAL
  Filled 2024-08-28 (×4): qty 1

## 2024-08-28 MED ORDER — ACETAMINOPHEN 650 MG RE SUPP: 650.0000 mg | Freq: Four times a day (QID) | RECTAL | Status: DC | PRN

## 2024-08-28 MED ORDER — HYDROXYZINE HCL 25 MG PO TABS: 25.0000 mg | ORAL_TABLET | Freq: Two times a day (BID) | ORAL | Status: DC | PRN

## 2024-08-28 MED ORDER — VALBENAZINE TOSYLATE 40 MG PO CAPS
80.0000 mg | ORAL_CAPSULE | Freq: Every day | ORAL | Status: DC
  Administered 2024-08-29 – 2024-09-01 (×4): 80 mg via ORAL
  Filled 2024-08-28 (×4): qty 2

## 2024-08-28 MED ORDER — DIPHENHYDRAMINE HCL 50 MG/ML IJ SOLN
25.0000 mg | Freq: Once | INTRAMUSCULAR | Status: AC
  Administered 2024-08-28: 25 mg via INTRAVENOUS
  Filled 2024-08-28: qty 1

## 2024-08-28 MED ORDER — SODIUM CHLORIDE 0.9 % IV BOLUS
1000.0000 mL | Freq: Once | INTRAVENOUS | Status: AC
  Administered 2024-08-28: 1000 mL via INTRAVENOUS

## 2024-08-28 MED ORDER — NICOTINE 21 MG/24HR TD PT24
21.0000 mg | MEDICATED_PATCH | Freq: Every day | TRANSDERMAL | Status: DC
  Administered 2024-08-29: 21 mg via TRANSDERMAL
  Filled 2024-08-28 (×2): qty 1

## 2024-08-28 MED ORDER — ACETAMINOPHEN 325 MG PO TABS
650.0000 mg | ORAL_TABLET | Freq: Four times a day (QID) | ORAL | Status: DC | PRN
  Administered 2024-08-29 – 2024-08-30 (×2): 650 mg via ORAL
  Filled 2024-08-28 (×3): qty 2

## 2024-08-28 MED ORDER — ONDANSETRON HCL 4 MG PO TABS: 4.0000 mg | ORAL_TABLET | Freq: Four times a day (QID) | ORAL | Status: DC | PRN

## 2024-08-28 MED ORDER — METHYLPREDNISOLONE SODIUM SUCC 125 MG IJ SOLR
125.0000 mg | Freq: Every day | INTRAMUSCULAR | Status: DC
  Administered 2024-08-28: 125 mg via INTRAVENOUS
  Filled 2024-08-28: qty 2

## 2024-08-28 NOTE — Code Documentation (Signed)
 Stroke Response Nurse Documentation Code Documentation  Danielle [name] is a 73 y.o. female arriving to [facility]  via [facility] on 11/11 with past medical hx of stroke, HTN, bipolar disorder, CAD. On No antithrombotic. Code stroke was activated by EMS.   Patient from SNF where she was LKW at 1730 and now complaining of slurred speech.  Stroke team at the bedside on patient arrival. Labs drawn and patient cleared for CT by Dr. Cottie. Patient to CT with team. NIHSS 2, see documentation for details and code stroke times. Patient with dysarthria  on exam. The following imaging was completed:  CT Head. Patient is not a candidate for IV Thrombolytic due to too mild to treat. Patient is not a candidate for IR due to low suspicion of LVO.     Bedside handoff with ED RN Delon.    Griselda Alm ORN  Rapid Response RN

## 2024-08-28 NOTE — H&P (Incomplete)
 History and Physical    Danielle [name] FMW:[id number] DOB: 04/03/1951 DOA: 08/28/2024  PCP: [facility]   Patient coming from: ALF   Chief Complaint: Difficulty speaking, lethargy   HPI: Danielle [name] is a 73 y.o. female with medical history significant for hypertension, COPD, CAD, memory loss, schizoaffective disorder, depression, anxiety, and tardive dyskinesia who presents with lethargy and difficulty speaking.  Patient was noted to be lethargic at her facility today and was having difficulty speaking.  EMS was called, SBP was said to be in the 70s initially but improved to the upper 90s, and patient was brought into the ED as a code stroke.  Patient feels that her tongue is swollen but denies any difficulty breathing or swallowing, denies any rash or itching, and denies any abdominal pain, nausea, vomiting, chest pain, or lightheadedness.  She does not believe she has been on any new medications recently.  ED Course: Upon arrival to the ED, patient is found to be afebrile and saturating well on room air with normal HR and stable BP.  Labs are most notable for creatinine 1.69, normal CBC, and normal lactic acid.  There are no acute findings on head CT.  Patient was evaluated by neurology who had low suspicion for stroke.  Blood cultures were collected and the patient was given a liter of NS, IV Solu-Medrol , and IV Benadryl .  BP and clinical status have remained stable.  Review of Systems:  All other systems reviewed and apart from HPI, are negative.  Past Medical History:  Diagnosis Date  . Asthma   . Bipolar affective disorder, currently depressed, mild (HCC)   . Chest pain   . Complication of anesthesia     My eyes swell up   . Coronary artery disease   . Depression   . Fall 08/25/2012   Recent fall with residual knee and back pain.  . Family history of anesthesia complication     [name]   . GERD (gastroesophageal reflux disease)   . Hypertension    . Memory disturbance 01/30/2016  . Stroke (HCC)   . Syncope and collapse 03/21/2019  . Tardive dyskinesia   . Tobacco abuse     Past Surgical History:  Procedure Laterality Date  . ABDOMINAL HYSTERECTOMY    . BIOPSY  05/11/2022   Procedure: BIOPSY;  Surgeon: Charlanne Groom, MD;  Location: [facility] ENDOSCOPY;  Service: Gastroenterology;;  . CARDIAC CATHETERIZATION    . ESOPHAGOGASTRODUODENOSCOPY (EGD) WITH PROPOFOL  N/A 05/11/2022   Procedure: ESOPHAGOGASTRODUODENOSCOPY (EGD) WITH PROPOFOL ;  Surgeon: Charlanne Groom, MD;  Location: [facility] ENDOSCOPY;  Service: Gastroenterology;  Laterality: N/A;  . HEMOSTASIS CONTROL  05/11/2022   Procedure: HEMOSTASIS CONTROL;  Surgeon: Charlanne Groom, MD;  Location: [facility] ENDOSCOPY;  Service: Gastroenterology;;  . HOT HEMOSTASIS N/A 05/11/2022   Procedure: HOT HEMOSTASIS (ARGON PLASMA COAGULATION/BICAP);  Surgeon: Charlanne Groom, MD;  Location: [facility] ENDOSCOPY;  Service: Gastroenterology;  Laterality: N/A;  . RIGHT/LEFT HEART CATH AND CORONARY ANGIOGRAPHY N/A 08/14/2018   Procedure: RIGHT/LEFT HEART CATH AND CORONARY ANGIOGRAPHY;  Surgeon: Anner Alm ORN, MD;  Location: [facility] INVASIVE CV LAB;  Service: Cardiovascular;  Laterality: N/A;    Social History:   reports that she has been smoking cigarettes. She has never used smokeless tobacco. She reports that she does not currently use alcohol. She reports that she does not currently use drugs after having used the following drugs: Cocaine and Marijuana.  Allergies  Allergen Reactions  . Clonazepam Swelling and Other (See Comments)    Pt  was recently given a triamterene  hydrochlorothiazide  combination as well as clonazepam and had an allergic reaction to one of them. Caused swelling of face and eyes  . Doxycycline  Swelling and Other (See Comments)    Swelling of face and eyes  . Fosinopril Sodium Swelling and Other (See Comments)    Swelling face,lips-angioedema from ACE I  . Hydrochlorothiazide -Triamterene  Swelling    Pt  was recently given a triamterene  hydrochlorothiazide  combination as well as clonazepam and had an allergic reaction to one of them. Swelling of face and eyes  . Penicillins Swelling and Other (See Comments)    Face and eyes Has patient had a PCN reaction causing immediate rash, facial/tongue/throat swelling, SOB or lightheadedness with hypotension: Yes Has patient had a PCN reaction causing severe rash involving mucus membranes or skin necrosis: No Has patient had a PCN reaction that required hospitalization: No Has patient had a PCN reaction occurring within the last 10 years: No If all of the above answers are NO, then may proceed with Cephalosporin use.   . Sulfa Antibiotics Other (See Comments)    Allergic, per [facility]  . Triamterene  Other (See Comments)    Pt was recently given a triamterene  hydrochlorothiazide  combination as well as clonazepam and had an allergic reaction to one of them. Allergic, per [facility]    Family History  Problem Relation Age of Onset  . Hypertension Mother   . Stroke Mother   . Heart attack Mother   . Heart attack Father   . Heart attack Sister   . Heart attack Brother   . Dementia Neg Hx   . Colon cancer Neg Hx   . Esophageal cancer Neg Hx   . Stomach cancer Neg Hx      Prior to Admission medications   Medication Sig Start Date End Date Taking? Authorizing Provider  albuterol  (PROVENTIL ) (2.5 MG/3ML) 0.083% nebulizer solution Take 2.5 mg by nebulization every 6 (six) hours as needed for shortness of breath.   Yes [provider]  albuterol  (VENTOLIN  HFA) 108 (90 Base) MCG/ACT inhaler Inhale 1-2 puffs into the lungs every 4 (four) hours as needed for wheezing or shortness of breath (or cough). 10/19/22  Yes Cheryle Page, MD  amLODipine  (NORVASC ) 5 MG tablet Take 5 mg by mouth daily.   Yes [provider]  atorvastatin  (LIPITOR) 10 MG tablet Take 10 mg by mouth daily.   Yes [provider]  busPIRone (BUSPAR) 15 MG tablet  Take 15 mg by mouth 3 (three) times daily. 12/21/23  Yes [provider]  Cholecalciferol (VITAMIN D3) 250 MCG (10000 UT) TABS Take 10,000 Units by mouth daily.   Yes [provider]  DHEA 25 MG CAPS Take 25 mg by mouth in the morning.   Yes [provider]  HYDROcodone -acetaminophen  (NORCO) 10-325 MG tablet Take 1 tablet by mouth See admin instructions. Give 1 tablet by mouth 5 time a day as needed 12/29/23 12/28/24 Yes Pokhrel, Laxman, MD  hydrOXYzine  (VISTARIL ) 25 MG capsule Take 25 mg by mouth 2 (two) times daily as needed for anxiety.   Yes [provider]  latanoprost  (XALATAN ) 0.005 % ophthalmic solution Place 1 drop into both eyes daily.   Yes [provider]  loratadine  (CLARITIN ) 10 MG tablet Take 10 mg by mouth daily.   Yes [provider]  metoprolol  tartrate (LOPRESSOR ) 25 MG tablet Take 1 tablet (25 mg total) by mouth 2 (two) times daily. 08/15/23  Yes Odell Celinda Balo, MD  nicotine  (NICODERM  CQ - DOSED IN MG/24 HOURS) 21 mg/24hr patch Place 21 mg onto the skin daily.   Yes [provider]  pregabalin  (LYRICA ) 75 MG capsule Take 1 capsule (75 mg total) by mouth 3 (three) times daily. 12/29/23 12/28/24 Yes Pokhrel, Laxman, MD  QUEtiapine  (SEROQUEL ) 200 MG tablet Take 200 mg by mouth at bedtime. 05/06/22  Yes [provider]  sertraline  (ZOLOFT ) 100 MG tablet Take 100 mg by mouth daily.   Yes [provider]  DOMINIC BECK 200-62.5-25 MCG/ACT AEPB Inhale 1 puff into the lungs daily. 12/05/23  Yes [provider]  triamcinolone cream (KENALOG) 0.1 % Apply 1 g topically See admin instructions. Apply (1 grams) green pea-sized bead to popliteal fossae twice a day 04/27/22  Yes [provider]  valbenazine  (INGREZZA ) 80 MG capsule Take 1 capsule (80 mg total) by mouth daily. 05/26/23  Yes     Physical Exam: Vitals:   08/28/24 1921 08/28/24 1936 08/28/24 2100 08/28/24 2200  BP:  (!) 95/58 105/67  (!) 101/58  Pulse:  66 64 65  Resp:  14 12 11   Temp:  98.1 F (36.7 C)    TempSrc:  Oral    SpO2:  97% 99% 98%  Weight: 69.6 kg        Constitutional: NAD, calm  Eyes: PERTLA, lids and conjunctivae normal ENMT: Mucous membranes are moist. Posterior pharynx clear of any exudate or lesions.   Neck: supple, no masses  Respiratory: clear to auscultation bilaterally, no wheezing, no crackles. No accessory muscle use.  Cardiovascular: S1 & S2 heard, regular rate and rhythm. No extremity edema. No significant JVD. Abdomen: No distension, no tenderness, soft. Bowel sounds active.  Musculoskeletal: no clubbing / cyanosis. No joint deformity upper and lower extremities.   Skin: no significant rashes, lesions, ulcers. Warm, dry, well-perfused. Neurologic: CN 2-12 grossly intact. Sensation intact, DTR normal. Strength 5/5 in all 4 limbs. Alert and oriented.  Psychiatric: Pleasant. Cooperative.    Labs and Imaging on Admission: I have personally reviewed following labs and imaging studies  CBC: Recent Labs  Lab 08/28/24 2010 08/28/24 2013  WBC 6.5  --   NEUTROABS 2.7  --   HGB 12.9 13.3  13.6  HCT 38.8 39.0  40.0  MCV 91.3  --   PLT 242  --    Basic Metabolic Panel: Recent Labs  Lab 08/28/24 2010 08/28/24 2013  NA 140 141  140  K 3.6 3.5  3.5  CL 103 105  CO2 22  --   GLUCOSE 123* 132*  BUN 30* 31*  CREATININE 1.69* 1.80*  CALCIUM  9.6  --    GFR: Estimated Creatinine Clearance: 26.2 mL/min (A) (by C-G formula based on SCr of 1.8 mg/dL (H)). Liver Function Tests: Recent Labs  Lab 08/28/24 2010  AST 17  ALT 12  ALKPHOS 60  BILITOT 0.5  PROT 6.2*  ALBUMIN 3.7   No results for input(s): LIPASE, AMYLASE in the last 168 hours. No results for input(s): AMMONIA in the last 168 hours. Coagulation Profile: Recent Labs  Lab 08/28/24 2010  INR 1.0   Cardiac Enzymes: No results for input(s): CKTOTAL, CKMB, CKMBINDEX, TROPONINI in the last 168  hours. BNP (last 3 results) No results for input(s): PROBNP in the last 8760 hours. HbA1C: No results for input(s): HGBA1C in the last 72 hours. CBG: Recent Labs  Lab 08/28/24 1957  GLUCAP 130*   Lipid Profile: No results for input(s): CHOL, HDL, LDLCALC, TRIG, CHOLHDL, LDLDIRECT in the  last 72 hours. Thyroid  Function Tests: No results for input(s): TSH, T4TOTAL, FREET4, T3FREE, THYROIDAB in the last 72 hours. Anemia Panel: No results for input(s): VITAMINB12, FOLATE, FERRITIN, TIBC, IRON, RETICCTPCT in the last 72 hours. Urine analysis:    Component Value Date/Time   COLORURINE YELLOW 12/26/2023 0426   APPEARANCEUR CLEAR 12/26/2023 0426   LABSPEC 1.011 12/26/2023 0426   PHURINE 5.0 12/26/2023 0426   GLUCOSEU NEGATIVE 12/26/2023 0426   HGBUR NEGATIVE 12/26/2023 0426   HGBUR negative 10/25/2008 0936   BILIRUBINUR NEGATIVE 12/26/2023 0426   KETONESUR NEGATIVE 12/26/2023 0426   PROTEINUR NEGATIVE 12/26/2023 0426   UROBILINOGEN 0.2 03/23/2018 1531   NITRITE NEGATIVE 12/26/2023 0426   LEUKOCYTESUR TRACE (A) 12/26/2023 0426   Sepsis Labs: @LABRCNTIP (procalcitonin:4,lacticidven:4) )No results found for this or any previous visit (from the past 240 hours).   Radiological Exams on Admission: DG Chest Port 1 View Result Date: 08/28/2024 EXAM: 1 VIEW(S) XRAY OF THE CHEST 08/28/2024 07:59:22 PM COMPARISON: 12/25/2023 CLINICAL HISTORY: Questionable sepsis - evaluate for abnormality. FINDINGS: LUNGS AND PLEURA: Mild patchy bibasilar opacities likely atelectasis. No pulmonary edema. No pleural effusion. No pneumothorax. HEART AND MEDIASTINUM: No acute abnormality of the cardiac and mediastinal silhouettes. BONES AND SOFT TISSUES: No acute osseous abnormality. IMPRESSION: 1. Mild patchy bibasilar opacities, likely atelectasis. Electronically signed by: Pinkie Pebbles MD 08/28/2024 08:04 PM EST RP Workstation: [id number]   CT HEAD CODE STROKE WO  CONTRAST` Result Date: 08/28/2024 CLINICAL DATA:  Code stroke. Initial evaluation for acute neuro deficit, stroke suspected. EXAM: CT HEAD WITHOUT CONTRAST TECHNIQUE: Contiguous axial images were obtained from the base of the skull through the vertex without intravenous contrast. RADIATION DOSE REDUCTION: This exam was performed according to the departmental dose-optimization program which includes automated exposure control, adjustment of the mA and/or kV according to patient size and/or use of iterative reconstruction technique. COMPARISON:  Comparison made with prior MRI from 08/30/2019. FINDINGS: Brain: Age-related cerebral atrophy with moderate chronic microvascular ischemic disease. Chronic right PCA distribution infarct involving the right occipital lobe noted. No acute intracranial hemorrhage. No acute large vessel territory infarct. No mass lesion or midline shift. No hydrocephalus or extra-axial fluid collection. Vascular: No abnormal hyperdense vessel. Calcified atherosclerosis present at the skull base. Skull: Scalp soft tissues demonstrate no acute finding. Calvarium intact. Sinuses/Orbits: Globes and orbital soft tissues within normal limits. Paranasal sinuses are largely clear. No significant mastoid effusion. Other: None. ASPECTS [facility] Stroke Program Early CT Score) - Ganglionic level infarction (caudate, lentiform nuclei, internal capsule, insula, M1-M3 cortex): 7 - Supraganglionic infarction (M4-M6 cortex): 3 Total score (0-10 with 10 being normal): 10 IMPRESSION: 1. No acute intracranial abnormality. 2. ASPECTS is 10. 3. Moderate chronic microvascular ischemic disease with underlying chronic right PCA distribution infarct. These results were communicated to Dr. Vanessa at 7:22 pm on 08/28/2024 by text page via the [facility] messaging system. Electronically Signed   By: Morene Hoard M.D.   On: 08/28/2024 19:36    EKG: Independently reviewed. Sinus rhythm.   Assessment/Plan   1.  Tongue swelling  - BP is stable and there is no respiratory or swallowing difficulty, no urticaria/pruritus, no ACE or NSAID use, and GI sxs  - Check C4, C1 esterase inhibitor, and C1q, continue close monitoring of airway    2. AKI  - SCr is 1.69, up from 0.92 in March 2025  - Check UA and FENa, continue IVF hydration, renally-dose medications, and repeat chem panel in am   3. Schizoaffective disorder; depression; anxiety; tardive dyskinesia -  Hold Seroquel  for now in light of somnolence, continue Ingrezza , Zoloft , Buspar    4. COPD  - Not in exacerbation   - Continue ICS-LAMA-LABA and as-needed albuterol     5. Hypertension  - BP low-normal in ED and antihypertensives held on admission    6. HLD  - Continue Lipitor   7. Memory loss  - Delirium precautions    DVT prophylaxis: SCDs  Code Status: Full  Level of Care: Level of care: Progressive Family Communication: Daughter updated by phone  Disposition Plan: Patient is from: ALF Anticipated d/c is to: ALF  Anticipated d/c date is: 08/29/24  Patient currently: Pending clinical stability  Consults called: None Admission status: Observation     Evalene GORMAN Sprinkles, MD [facility]  08/28/2024, 11:32 PM

## 2024-08-28 NOTE — Consult Note (Signed)
 NEUROLOGY CONSULT NOTE   Date of service: August 28, 2024 Patient Name: Danielle [name] MRN:  [id number] DOB:  [birth year] Chief Complaint: slurred speech Requesting Provider: Cottie Donnice PARAS, MD  History of Present Illness  Danielle [name] is a 73 y.o. female with hx of asthma bipolar, GERD, prior strokes, CAD, depression, remote hx of IVDU, schizphnrenia who is brought in from her facilty with slurred speech and lethargy and hypotension.  EMS called staff. LKW was reported 1730 and later noted to be more tired, lethargic and speech slurring. She was hypotensive to 70s in the field and improved to 90s systolic.  Patient has slurred speech on arrival but no other deficits.  CT Head w.o contrast with no acute abnormalities.  LKW: 1730 Modified rankin score: 3-Moderate disability-requires help but walks WITHOUT assistance IV Thrombolysis: not offered, too mild to treat, low suspicion for stroke. EVT: not offered, too mild to treat and low suspicion for stroke.  NIHSS components Score: Comment  1a Level of Conscious 0[]  1[]  2[]  3[]      1b LOC Questions 0[]  1[]  2[]       1c LOC Commands 0[]  1[]  2[]       2 Best Gaze 0[]  1[]  2[]       3 Visual 0[]  1[]  2[]  3[]      4 Facial Palsy 0[]  1[]  2[]  3[]      5a Motor Arm - left 0[]  1[]  2[]  3[]  4[]  UN[]    5b Motor Arm - Right 0[]  1[]  2[]  3[]  4[]  UN[]    6a Motor Leg - Left 0[]  1[]  2[]  3[]  4[]  UN[]    6b Motor Leg - Right 0[]  1[]  2[]  3[]  4[]  UN[]    7 Limb Ataxia 0[]  1[]  2[]  UN[]      8 Sensory 0[]  1[]  2[]  UN[]      9 Best Language 0[]  1[]  2[]  3[]      10 Dysarthria 0[]  1[]  2[x]  UN[]      11 Extinct. and Inattention 0[]  1[]  2[]       TOTAL: 2      ROS  Comprehensive ROS performed and pertinent positives documented in HPI   Past History   Past Medical History:  Diagnosis Date   Asthma    Bipolar affective disorder, currently depressed, mild (HCC)    Chest pain    Complication of anesthesia     My eyes swell up    Coronary artery  disease    Depression    Fall 08/25/2012   Recent fall with residual knee and back pain.   Family history of anesthesia complication     [name]    GERD (gastroesophageal reflux disease)    Hypertension    Memory disturbance 01/30/2016   Stroke (HCC)    Syncope and collapse 03/21/2019   Tardive dyskinesia    Tobacco abuse     Past Surgical History:  Procedure Laterality Date   ABDOMINAL HYSTERECTOMY     BIOPSY  05/11/2022   Procedure: BIOPSY;  Surgeon: Charlanne Groom, MD;  Location: [facility] ENDOSCOPY;  Service: Gastroenterology;;   CARDIAC CATHETERIZATION     ESOPHAGOGASTRODUODENOSCOPY (EGD) WITH PROPOFOL  N/A 05/11/2022   Procedure: ESOPHAGOGASTRODUODENOSCOPY (EGD) WITH PROPOFOL ;  Surgeon: Charlanne Groom, MD;  Location: [facility] ENDOSCOPY;  Service: Gastroenterology;  Laterality: N/A;   HEMOSTASIS CONTROL  05/11/2022   Procedure: HEMOSTASIS CONTROL;  Surgeon: Charlanne Groom, MD;  Location: [facility] ENDOSCOPY;  Service: Gastroenterology;;   HOT HEMOSTASIS N/A 05/11/2022   Procedure: HOT HEMOSTASIS (ARGON PLASMA COAGULATION/BICAP);  Surgeon: Charlanne Groom, MD;  Location: [facility] ENDOSCOPY;  Service:  Gastroenterology;  Laterality: N/A;   RIGHT/LEFT HEART CATH AND CORONARY ANGIOGRAPHY N/A 08/14/2018   Procedure: RIGHT/LEFT HEART CATH AND CORONARY ANGIOGRAPHY;  Surgeon: Anner Alm ORN, MD;  Location: [facility] INVASIVE CV LAB;  Service: Cardiovascular;  Laterality: N/A;    Family History: Family History  Problem Relation Age of Onset   Hypertension Mother    Stroke Mother    Heart attack Mother    Heart attack Father    Heart attack Sister    Heart attack Brother    Dementia Neg Hx    Colon cancer Neg Hx    Esophageal cancer Neg Hx    Stomach cancer Neg Hx     Social History  reports that she has been smoking cigarettes. She has never used smokeless tobacco. She reports that she does not currently use alcohol. She reports that she does not currently use drugs after having used the following drugs:  Cocaine and Marijuana.  Allergies  Allergen Reactions   Clonazepam Swelling and Other (See Comments)    Pt was recently given a triamterene  hydrochlorothiazide  combination as well as clonazepam and had an allergic reaction to one of them. Caused swelling of face and eyes   Doxycycline  Swelling and Other (See Comments)    Swelling of face and eyes   Fosinopril Sodium Swelling and Other (See Comments)    Swelling face,lips-angioedema from ACE I   Hydrochlorothiazide -Triamterene  Swelling    Pt was recently given a triamterene  hydrochlorothiazide  combination as well as clonazepam and had an allergic reaction to one of them. Swelling of face and eyes   Penicillins Swelling and Other (See Comments)    Face and eyes Has patient had a PCN reaction causing immediate rash, facial/tongue/throat swelling, SOB or lightheadedness with hypotension: Yes Has patient had a PCN reaction causing severe rash involving mucus membranes or skin necrosis: No Has patient had a PCN reaction that required hospitalization: No Has patient had a PCN reaction occurring within the last 10 years: No If all of the above answers are NO, then may proceed with Cephalosporin use.    Sulfa Antibiotics Other (See Comments)    Allergic, per [facility]   Triamterene  Other (See Comments)    Pt was recently given a triamterene  hydrochlorothiazide  combination as well as clonazepam and had an allergic reaction to one of them. Allergic, per [facility]    Medications  No current facility-administered medications for this encounter.  Current Outpatient Medications:    albuterol  (PROVENTIL ) (2.5 MG/3ML) 0.083% nebulizer solution, Take 2.5 mg by nebulization every 6 (six) hours as needed for shortness of breath., Disp: , Rfl:    albuterol  (VENTOLIN  HFA) 108 (90 Base) MCG/ACT inhaler, Inhale 1-2 puffs into the lungs every 4 (four) hours as needed for wheezing or shortness of breath (or cough)., Disp: , Rfl:    amLODipine  (NORVASC ) 5 MG  tablet, Take 5 mg by mouth daily., Disp: , Rfl:    atorvastatin  (LIPITOR) 10 MG tablet, Take 10 mg by mouth daily., Disp: , Rfl:    busPIRone (BUSPAR) 15 MG tablet, Take 15 mg by mouth 3 (three) times daily., Disp: , Rfl:    Cholecalciferol (VITAMIN D3) 250 MCG (10000 UT) TABS, Take 10,000 Units by mouth daily., Disp: , Rfl:    DHEA 25 MG CAPS, Take 25 mg by mouth in the morning., Disp: , Rfl:    HYDROcodone -acetaminophen  (NORCO) 10-325 MG tablet, Take 1 tablet by mouth See admin instructions. Give 1 tablet by mouth 5 time a day as needed,  Disp: 10 tablet, Rfl: 0   hydrOXYzine  (VISTARIL ) 25 MG capsule, Take 25 mg by mouth 2 (two) times daily as needed for anxiety., Disp: , Rfl:    latanoprost  (XALATAN ) 0.005 % ophthalmic solution, Place 1 drop into both eyes daily., Disp: , Rfl:    loratadine  (CLARITIN ) 10 MG tablet, Take 10 mg by mouth daily., Disp: , Rfl:    metoprolol  tartrate (LOPRESSOR ) 25 MG tablet, Take 1 tablet (25 mg total) by mouth 2 (two) times daily., Disp: 60 tablet, Rfl: 1   nicotine  (NICODERM CQ  - DOSED IN MG/24 HOURS) 21 mg/24hr patch, Place 21 mg onto the skin daily., Disp: , Rfl:    pregabalin  (LYRICA ) 75 MG capsule, Take 1 capsule (75 mg total) by mouth 3 (three) times daily., Disp: 15 capsule, Rfl: 0   QUEtiapine  (SEROQUEL ) 200 MG tablet, Take 200 mg by mouth at bedtime., Disp: , Rfl:    sertraline  (ZOLOFT ) 100 MG tablet, Take 100 mg by mouth daily., Disp: , Rfl:    TRELEGY ELLIPTA 200-62.5-25 MCG/ACT AEPB, Inhale 1 puff into the lungs daily., Disp: , Rfl:    triamcinolone cream (KENALOG) 0.1 %, Apply 1 g topically See admin instructions. Apply (1 grams) green pea-sized bead to popliteal fossae twice a day, Disp: , Rfl:    valbenazine  (INGREZZA ) 80 MG capsule, Take 1 capsule (80 mg total) by mouth daily., Disp: 30 capsule, Rfl: 11   tiZANidine  (ZANAFLEX ) 2 MG tablet, Take 2 mg by mouth every 8 (eight) hours as needed for muscle spasms. (Patient not taking: Reported on  08/28/2024), Disp: , Rfl:   Vitals   Vitals:   08/28/24 1921 08/28/24 1936  BP:  (!) 95/58  Pulse:  66  Resp:  14  Temp:  98.1 F (36.7 C)  TempSrc:  Oral  SpO2:  97%  Weight: 69.6 kg     Body mass index is 28.06 kg/m.   Physical Exam   General: Laying comfortably in bed; in no acute distress.  HENT: Normal oropharynx and mucosa. Normal external appearance of ears and nose.  Neck: Supple, no pain or tenderness  CV: No JVD. No peripheral edema.  Pulmonary: Symmetric Chest rise. Normal respiratory effort.  Abdomen: Soft to touch, non-tender.  Ext: No cyanosis, edema, or deformity  Skin: No rash. Normal palpation of skin.   Musculoskeletal: Normal digits and nails by inspection. No clubbing.   Neurologic Examination  Mental status/Cognition: Alert, oriented to self, place, month and year, good attention.  Speech/language: Fluent, comprehension intact, object naming intact, repetition intact.  Cranial nerves:   CN II Pupils equal and reactive to light, no VF deficits    CN III,IV,VI EOM intact, no gaze preference or deviation, no nystagmus    CN V normal sensation in V1, V2, and V3 segments bilaterally    CN VII no asymmetry, no nasolabial fold flattening    CN VIII normal hearing to speech    CN IX & X normal palatal elevation, no uvular deviation    CN XI 5/5 head turn and 5/5 shoulder shrug bilaterally    CN XII midline tongue protrusion    Motor:  Muscle bulk: normal, tone normal, pronator drift none tremor none Mvmt Root Nerve  Muscle Right Left Comments  SA C5/6 Ax Deltoid 5 5   EF C5/6 Mc Biceps 5 5   EE C6/7/8 Rad Triceps 5 5   WF C6/7 Med FCR     WE C7/8 PIN ECU     F Ab  C8/T1 U ADM/FDI 5 5   HF L1/2/3 Fem Illopsoas 5 5   KE L2/3/4 Fem Quad 5 5   DF L4/5 D Peron Tib Ant 5 5   PF S1/2 Tibial Grc/Sol 5 5    Sensation:  Light touch Intact throughout   Pin prick    Temperature    Vibration   Proprioception    Coordination/Complex Motor:  - Finger to  Nose intact BL - Heel to shin intact BL - Rapid alternating movement are normal - Gait: deferred.  Labs/Imaging/Neurodiagnostic studies   CBC:  Recent Labs  Lab 03-Sep-2024 2010 09-03-24 2013  WBC 6.5  --   NEUTROABS 2.7  --   HGB 12.9 13.3  13.6  HCT 38.8 39.0  40.0  MCV 91.3  --   PLT 242  --    Basic Metabolic Panel:  Lab Results  Component Value Date   NA 140 09-03-2024   NA 141 09/03/24   K 3.5 Sep 03, 2024   K 3.5 09-03-2024   CO2 20 (L) 12/29/2023   GLUCOSE 132 (H) 09/03/24   BUN 31 (H) 2024/09/03   CREATININE 1.80 (H) 09-03-2024   CALCIUM  10.2 12/29/2023   GFRNONAA >60 12/29/2023   GFRAA >60 04/28/2019   Lipid Panel:  Lab Results  Component Value Date   LDLCALC 83 12/30/2020   HgbA1c:  Lab Results  Component Value Date   HGBA1C 5.5 03/23/2019   Urine Drug Screen:     Component Value Date/Time   LABOPIA POSITIVE (A) 05/11/2022 0831   COCAINSCRNUR NONE DETECTED 05/11/2022 0831   COCAINSCRNUR NEG 08/01/2008 2209   LABBENZ NONE DETECTED 05/11/2022 0831   LABBENZ NEG 08/01/2008 2209   AMPHETMU NONE DETECTED 05/11/2022 0831   THCU NONE DETECTED 05/11/2022 0831   LABBARB NONE DETECTED 05/11/2022 0831    Alcohol Level     Component Value Date/Time   ETH <15 Sep 03, 2024 2011   INR  Lab Results  Component Value Date   INR 1.0 September 03, 2024   APTT  Lab Results  Component Value Date   APTT 30 September 03, 2024   AED levels: No results found for: PHENYTOIN, ZONISAMIDE, LAMOTRIGINE, LEVETIRACETA  CT Head without contrast(Personally reviewed): CTH was negative for a large hypodensity concerning for a large territory infarct or hyperdensity concerning for an ICH  ASSESSMENT   Sofie Schendel is a 73 y.o. female with hx of asthma bipolar, GERD, prior strokes, CAD, depression, remote hx of IVDU, schizphnrenia who is brought in from her facilty with slurred speech and lethargy and hypotension. Exam with slurred speech but also dry mouth and tongue  appears enlarged.  In the absence of any disabling symptoms, tnkase and thrombectomy were not offered.   Clinical suspicion for stroke is low. I think the combination of lethargy, ?enlarged tongue and significantly dry mouth likely explains her slurred speech.  RECOMMENDATIONS  - MRI Brain w.o contrast only if no other etiology of her slurred speech is identified. ______________________________________________________________________  Plan discussed with Dr. Cottie.  Signed, Debie Ashline, MD [facility]

## 2024-08-28 NOTE — H&P (Signed)
 History and Physical    Wafaa Deemer FMW:[id number] DOB: [birth year] DOA: 08/28/2024  PCP: [facility]   Patient coming from: ALF   Chief Complaint: Difficulty speaking, lethargy   HPI: Danielle [name] is a 73 y.o. female with medical history significant for hypertension, COPD, CAD, memory loss, schizoaffective disorder, depression, anxiety, and tardive dyskinesia who presents with lethargy and difficulty speaking.  Patient was noted to be lethargic at her facility today and was having difficulty speaking.  EMS was called, SBP was said to be in the 70s initially but improved to the upper 90s, and patient was brought into the ED as a code stroke.  Patient feels that her tongue is swollen but denies any difficulty breathing or swallowing, denies any rash or itching, and denies any abdominal pain, nausea, vomiting, chest pain, or lightheadedness.  She does not believe she has been on any new medications recently.  ED Course: Upon arrival to the ED, patient is found to be afebrile and saturating well on room air with normal HR and stable BP.  Labs are most notable for creatinine 1.69, normal CBC, and normal lactic acid.  There are no acute findings on head CT.  Patient was evaluated by neurology who had low suspicion for stroke.  Blood cultures were collected and the patient was given a liter of NS, IV Solu-Medrol , and IV Benadryl .  BP and clinical status have remained stable.  Review of Systems:  All other systems reviewed and apart from HPI, are negative.  Past Medical History:  Diagnosis Date   Asthma    Bipolar affective disorder, currently depressed, mild (HCC)    Chest pain    Complication of anesthesia     My eyes swell up    Coronary artery disease    Depression    Fall 08/25/2012   Recent fall with residual knee and back pain.   Family history of anesthesia complication     [name]    GERD (gastroesophageal reflux disease)    Hypertension     Memory disturbance 01/30/2016   Stroke (HCC)    Syncope and collapse 03/21/2019   Tardive dyskinesia    Tobacco abuse     Past Surgical History:  Procedure Laterality Date   ABDOMINAL HYSTERECTOMY     BIOPSY  05/11/2022   Procedure: BIOPSY;  Surgeon: Charlanne Groom, MD;  Location: [facility] ENDOSCOPY;  Service: Gastroenterology;;   CARDIAC CATHETERIZATION     ESOPHAGOGASTRODUODENOSCOPY (EGD) WITH PROPOFOL  N/A 05/11/2022   Procedure: ESOPHAGOGASTRODUODENOSCOPY (EGD) WITH PROPOFOL ;  Surgeon: Charlanne Groom, MD;  Location: [facility] ENDOSCOPY;  Service: Gastroenterology;  Laterality: N/A;   HEMOSTASIS CONTROL  05/11/2022   Procedure: HEMOSTASIS CONTROL;  Surgeon: Charlanne Groom, MD;  Location: [facility] ENDOSCOPY;  Service: Gastroenterology;;   HOT HEMOSTASIS N/A 05/11/2022   Procedure: HOT HEMOSTASIS (ARGON PLASMA COAGULATION/BICAP);  Surgeon: Charlanne Groom, MD;  Location: [facility] ENDOSCOPY;  Service: Gastroenterology;  Laterality: N/A;   RIGHT/LEFT HEART CATH AND CORONARY ANGIOGRAPHY N/A 08/14/2018   Procedure: RIGHT/LEFT HEART CATH AND CORONARY ANGIOGRAPHY;  Surgeon: Anner Alm ORN, MD;  Location: [facility] INVASIVE CV LAB;  Service: Cardiovascular;  Laterality: N/A;    Social History:   reports that she has been smoking cigarettes. She has never used smokeless tobacco. She reports that she does not currently use alcohol. She reports that she does not currently use drugs after having used the following drugs: Cocaine and Marijuana.  Allergies  Allergen Reactions   Clonazepam Swelling and Other (See Comments)    Pt  was recently given a triamterene  hydrochlorothiazide  combination as well as clonazepam and had an allergic reaction to one of them. Caused swelling of face and eyes   Doxycycline  Swelling and Other (See Comments)    Swelling of face and eyes   Fosinopril Sodium Swelling and Other (See Comments)    Swelling face,lips-angioedema from ACE I   Hydrochlorothiazide -Triamterene  Swelling    Pt was recently given a  triamterene  hydrochlorothiazide  combination as well as clonazepam and had an allergic reaction to one of them. Swelling of face and eyes   Penicillins Swelling and Other (See Comments)    Face and eyes Has patient had a PCN reaction causing immediate rash, facial/tongue/throat swelling, SOB or lightheadedness with hypotension: Yes Has patient had a PCN reaction causing severe rash involving mucus membranes or skin necrosis: No Has patient had a PCN reaction that required hospitalization: No Has patient had a PCN reaction occurring within the last 10 years: No If all of the above answers are NO, then may proceed with Cephalosporin use.    Sulfa Antibiotics Other (See Comments)    Allergic, per [facility]   Triamterene  Other (See Comments)    Pt was recently given a triamterene  hydrochlorothiazide  combination as well as clonazepam and had an allergic reaction to one of them. Allergic, per [facility]    Family History  Problem Relation Age of Onset   Hypertension Mother    Stroke Mother    Heart attack Mother    Heart attack Father    Heart attack Sister    Heart attack Brother    Dementia Neg Hx    Colon cancer Neg Hx    Esophageal cancer Neg Hx    Stomach cancer Neg Hx      Prior to Admission medications   Medication Sig Start Date End Date Taking? Authorizing Provider  albuterol  (PROVENTIL ) (2.5 MG/3ML) 0.083% nebulizer solution Take 2.5 mg by nebulization every 6 (six) hours as needed for shortness of breath.   Yes [provider]  albuterol  (VENTOLIN  HFA) 108 (90 Base) MCG/ACT inhaler Inhale 1-2 puffs into the lungs every 4 (four) hours as needed for wheezing or shortness of breath (or cough). 10/19/22  Yes Cheryle Page, MD  amLODipine  (NORVASC ) 5 MG tablet Take 5 mg by mouth daily.   Yes [provider]  atorvastatin  (LIPITOR) 10 MG tablet Take 10 mg by mouth daily.   Yes [provider]  busPIRone (BUSPAR) 15 MG tablet Take 15 mg by mouth 3 (three) times  daily. 12/21/23  Yes [provider]  Cholecalciferol (VITAMIN D3) 250 MCG (10000 UT) TABS Take 10,000 Units by mouth daily.   Yes [provider]  DHEA 25 MG CAPS Take 25 mg by mouth in the morning.   Yes [provider]  HYDROcodone -acetaminophen  (NORCO) 10-325 MG tablet Take 1 tablet by mouth See admin instructions. Give 1 tablet by mouth 5 time a day as needed 12/29/23 12/28/24 Yes Pokhrel, Laxman, MD  hydrOXYzine  (VISTARIL ) 25 MG capsule Take 25 mg by mouth 2 (two) times daily as needed for anxiety.   Yes [provider]  latanoprost  (XALATAN ) 0.005 % ophthalmic solution Place 1 drop into both eyes daily.   Yes [provider]  loratadine  (CLARITIN ) 10 MG tablet Take 10 mg by mouth daily.   Yes [provider]  metoprolol  tartrate (LOPRESSOR ) 25 MG tablet Take 1 tablet (25 mg total) by mouth 2 (two) times daily. 08/15/23  Yes Odell Celinda Balo, MD  nicotine  (NICODERM  CQ - DOSED IN MG/24 HOURS) 21 mg/24hr patch Place 21 mg onto the skin daily.   Yes [provider]  pregabalin  (LYRICA ) 75 MG capsule Take 1 capsule (75 mg total) by mouth 3 (three) times daily. 12/29/23 12/28/24 Yes Pokhrel, Laxman, MD  QUEtiapine  (SEROQUEL ) 200 MG tablet Take 200 mg by mouth at bedtime. 05/06/22  Yes [provider]  sertraline  (ZOLOFT ) 100 MG tablet Take 100 mg by mouth daily.   Yes [provider]  DOMINIC BECK 200-62.5-25 MCG/ACT AEPB Inhale 1 puff into the lungs daily. 12/05/23  Yes [provider]  triamcinolone cream (KENALOG) 0.1 % Apply 1 g topically See admin instructions. Apply (1 grams) green pea-sized bead to popliteal fossae twice a day 04/27/22  Yes [provider]  valbenazine  (INGREZZA ) 80 MG capsule Take 1 capsule (80 mg total) by mouth daily. 05/26/23  Yes     Physical Exam: Vitals:   08/28/24 1921 08/28/24 1936 08/28/24 2100 08/28/24 2200  BP:  (!) 95/58 105/67 (!) 101/58  Pulse:  66 64 65  Resp:   14 12 11   Temp:  98.1 F (36.7 C)    TempSrc:  Oral    SpO2:  97% 99% 98%  Weight: 69.6 kg       Constitutional: NAD, no pallor or diaphoresis   Eyes: PERTLA, lids and conjunctivae normal ENMT: Mucous membranes are moist. Tongue enlarged, uvula appears normal, no exudates.   Neck: supple, no masses  Respiratory: no wheezing, no stridor, no crackles. No accessory muscle use.  Cardiovascular: S1 & S2 heard, regular rate and rhythm. No extremity edema.  Abdomen: No tenderness, soft. Bowel sounds active.  Musculoskeletal: no clubbing / cyanosis. No joint deformity upper and lower extremities.   Skin: no significant rashes, lesions, ulcers. Warm, dry, well-perfused. Neurologic: CN 2-12 grossly intact. Moving all extremities. Sleeping, wakes to voice and oriented to person, place, and situation.  Psychiatric: Calm. Cooperative.    Labs and Imaging on Admission: I have personally reviewed following labs and imaging studies  CBC: Recent Labs  Lab 08/28/24 2010 08/28/24 2013  WBC 6.5  --   NEUTROABS 2.7  --   HGB 12.9 13.3  13.6  HCT 38.8 39.0  40.0  MCV 91.3  --   PLT 242  --    Basic Metabolic Panel: Recent Labs  Lab 08/28/24 2010 08/28/24 2013  NA 140 141  140  K 3.6 3.5  3.5  CL 103 105  CO2 22  --   GLUCOSE 123* 132*  BUN 30* 31*  CREATININE 1.69* 1.80*  CALCIUM  9.6  --    GFR: Estimated Creatinine Clearance: 26.2 mL/min (A) (by C-G formula based on SCr of 1.8 mg/dL (H)). Liver Function Tests: Recent Labs  Lab 08/28/24 2010  AST 17  ALT 12  ALKPHOS 60  BILITOT 0.5  PROT 6.2*  ALBUMIN 3.7   No results for input(s): LIPASE, AMYLASE in the last 168 hours. No results for input(s): AMMONIA in the last 168 hours. Coagulation Profile: Recent Labs  Lab 08/28/24 2010  INR 1.0   Cardiac Enzymes: No results for input(s): CKTOTAL, CKMB, CKMBINDEX, TROPONINI in the last 168 hours. BNP (last 3 results) No results for input(s): PROBNP in the  last 8760 hours. HbA1C: No results for input(s): HGBA1C in the last 72 hours. CBG: Recent Labs  Lab 08/28/24 1957  GLUCAP 130*   Lipid Profile: No results for input(s): CHOL, HDL, LDLCALC, TRIG, CHOLHDL, LDLDIRECT in the last 72 hours.  Thyroid  Function Tests: No results for input(s): TSH, T4TOTAL, FREET4, T3FREE, THYROIDAB in the last 72 hours. Anemia Panel: No results for input(s): VITAMINB12, FOLATE, FERRITIN, TIBC, IRON, RETICCTPCT in the last 72 hours. Urine analysis:    Component Value Date/Time   COLORURINE YELLOW 12/26/2023 0426   APPEARANCEUR CLEAR 12/26/2023 0426   LABSPEC 1.011 12/26/2023 0426   PHURINE 5.0 12/26/2023 0426   GLUCOSEU NEGATIVE 12/26/2023 0426   HGBUR NEGATIVE 12/26/2023 0426   HGBUR negative 10/25/2008 0936   BILIRUBINUR NEGATIVE 12/26/2023 0426   KETONESUR NEGATIVE 12/26/2023 0426   PROTEINUR NEGATIVE 12/26/2023 0426   UROBILINOGEN 0.2 03/23/2018 1531   NITRITE NEGATIVE 12/26/2023 0426   LEUKOCYTESUR TRACE (A) 12/26/2023 0426   Sepsis Labs: @LABRCNTIP (procalcitonin:4,lacticidven:4) )No results found for this or any previous visit (from the past 240 hours).   Radiological Exams on Admission: DG Chest Port 1 View Result Date: 08/28/2024 EXAM: 1 VIEW(S) XRAY OF THE CHEST 08/28/2024 07:59:22 PM COMPARISON: 12/25/2023 CLINICAL HISTORY: Questionable sepsis - evaluate for abnormality. FINDINGS: LUNGS AND PLEURA: Mild patchy bibasilar opacities likely atelectasis. No pulmonary edema. No pleural effusion. No pneumothorax. HEART AND MEDIASTINUM: No acute abnormality of the cardiac and mediastinal silhouettes. BONES AND SOFT TISSUES: No acute osseous abnormality. IMPRESSION: 1. Mild patchy bibasilar opacities, likely atelectasis. Electronically signed by: Pinkie Pebbles MD 08/28/2024 08:04 PM EST RP Workstation: [id number]   CT HEAD CODE STROKE WO CONTRAST` Result Date: 08/28/2024 CLINICAL DATA:  Code stroke. Initial  evaluation for acute neuro deficit, stroke suspected. EXAM: CT HEAD WITHOUT CONTRAST TECHNIQUE: Contiguous axial images were obtained from the base of the skull through the vertex without intravenous contrast. RADIATION DOSE REDUCTION: This exam was performed according to the departmental dose-optimization program which includes automated exposure control, adjustment of the mA and/or kV according to patient size and/or use of iterative reconstruction technique. COMPARISON:  Comparison made with prior MRI from 08/30/2019. FINDINGS: Brain: Age-related cerebral atrophy with moderate chronic microvascular ischemic disease. Chronic right PCA distribution infarct involving the right occipital lobe noted. No acute intracranial hemorrhage. No acute large vessel territory infarct. No mass lesion or midline shift. No hydrocephalus or extra-axial fluid collection. Vascular: No abnormal hyperdense vessel. Calcified atherosclerosis present at the skull base. Skull: Scalp soft tissues demonstrate no acute finding. Calvarium intact. Sinuses/Orbits: Globes and orbital soft tissues within normal limits. Paranasal sinuses are largely clear. No significant mastoid effusion. Other: None. ASPECTS [facility] Stroke Program Early CT Score) - Ganglionic level infarction (caudate, lentiform nuclei, internal capsule, insula, M1-M3 cortex): 7 - Supraganglionic infarction (M4-M6 cortex): 3 Total score (0-10 with 10 being normal): 10 IMPRESSION: 1. No acute intracranial abnormality. 2. ASPECTS is 10. 3. Moderate chronic microvascular ischemic disease with underlying chronic right PCA distribution infarct. These results were communicated to Dr. Vanessa at 7:22 pm on 08/28/2024 by text page via the [facility] messaging system. Electronically Signed   By: Morene Hoard M.D.   On: 08/28/2024 19:36    EKG: Independently reviewed. Sinus rhythm.   Assessment/Plan   1. Tongue swelling  - BP is stable and there is no respiratory or swallowing  difficulty, no urticaria/pruritus, no ACE or NSAID use, and GI sxs  - Check C4, C1 esterase inhibitor, and C1q, continue close monitoring of airway    2. AKI  - SCr is 1.69, up from 0.92 in March 2025  - Check UA and FENa, continue IVF hydration, renally-dose medications, and repeat chem panel in am   3. Schizoaffective disorder; depression; anxiety; tardive dyskinesia - Hold Seroquel   for now in light of somnolence, continue Ingrezza , Zoloft , Buspar    4. COPD  - Not in exacerbation   - Continue ICS-LAMA-LABA and as-needed albuterol     5. Hypertension  - BP low-normal in ED and antihypertensives held on admission    6. HLD  - Continue Lipitor   7. Memory loss  - Delirium precautions    DVT prophylaxis: SCDs  Code Status: Full  Level of Care: Level of care: Progressive Family Communication: Daughter updated by phone  Disposition Plan: Patient is from: ALF Anticipated d/c is to: ALF  Anticipated d/c date is: 08/29/24  Patient currently: Pending clinical stability  Consults called: None Admission status: Observation     Evalene GORMAN Sprinkles, MD [facility]  08/28/2024, 11:32 PM

## 2024-08-28 NOTE — ED Provider Notes (Signed)
 [place] EMERGENCY DEPARTMENT AT [facility] Provider Note   CSN: [id number] Arrival date & time: 08/28/24  1919     Patient presents with: Code Stroke   Kinzey Sheriff is a 73 y.o. female presenting from a nursing facility with concern for slurred speech and behavioral change beginning around 530 today, when she was last seen well.  EMS reports blood pressure initially as low as 70 systolic, improved to 96/60 on arrival.  Blood glucose 154 per EMS.  Patient feels her speech is slurred.   Medical records reviewed including hospitalization 2021 with concern for altered mental status.  Patient was diagnosed with severe sepsis at the time although blood cultures urine cultures did not show any significant growth of bacteria.  Workup including MRI findings was compatible with sequelae of Warnicke's encephalopathy.   HPI     Prior to Admission medications   Medication Sig Start Date End Date Taking? Authorizing Provider  albuterol  (PROVENTIL ) (2.5 MG/3ML) 0.083% nebulizer solution Take 2.5 mg by nebulization every 6 (six) hours as needed for shortness of breath.   Yes [provider]  albuterol  (VENTOLIN  HFA) 108 (90 Base) MCG/ACT inhaler Inhale 1-2 puffs into the lungs every 4 (four) hours as needed for wheezing or shortness of breath (or cough). 10/19/22  Yes Cheryle Page, MD  amLODipine  (NORVASC ) 5 MG tablet Take 5 mg by mouth daily.   Yes [provider]  atorvastatin  (LIPITOR) 10 MG tablet Take 10 mg by mouth daily.   Yes [provider]  busPIRone (BUSPAR) 15 MG tablet Take 15 mg by mouth 3 (three) times daily. 12/21/23  Yes [provider]  Cholecalciferol (VITAMIN D3) 250 MCG (10000 UT) TABS Take 10,000 Units by mouth daily.   Yes [provider]  DHEA 25 MG CAPS Take 25 mg by mouth in the morning.   Yes [provider]  HYDROcodone -acetaminophen  (NORCO) 10-325 MG tablet Take 1 tablet by mouth See admin instructions.  Give 1 tablet by mouth 5 time a day as needed 12/29/23 12/28/24 Yes Pokhrel, Laxman, MD  hydrOXYzine  (VISTARIL ) 25 MG capsule Take 25 mg by mouth 2 (two) times daily as needed for anxiety.   Yes [provider]  latanoprost  (XALATAN ) 0.005 % ophthalmic solution Place 1 drop into both eyes daily.   Yes [provider]  loratadine  (CLARITIN ) 10 MG tablet Take 10 mg by mouth daily.   Yes [provider]  metoprolol  tartrate (LOPRESSOR ) 25 MG tablet Take 1 tablet (25 mg total) by mouth 2 (two) times daily. 08/15/23  Yes Odell Celinda Balo, MD  nicotine  (NICODERM CQ  - DOSED IN MG/24 HOURS) 21 mg/24hr patch Place 21 mg onto the skin daily.   Yes [provider]  pregabalin  (LYRICA ) 75 MG capsule Take 1 capsule (75 mg total) by mouth 3 (three) times daily. 12/29/23 12/28/24 Yes Pokhrel, Laxman, MD  QUEtiapine  (SEROQUEL ) 200 MG tablet Take 200 mg by mouth at bedtime. 05/06/22  Yes [provider]  sertraline  (ZOLOFT ) 100 MG tablet Take 100 mg by mouth daily.   Yes [provider]  DOMINIC BECK 200-62.5-25 MCG/ACT AEPB Inhale 1 puff into the lungs daily. 12/05/23  Yes [provider]  triamcinolone cream (KENALOG) 0.1 % Apply 1 g topically See admin instructions. Apply (1 grams) green pea-sized bead to popliteal fossae twice a day 04/27/22  Yes [provider]  valbenazine  (INGREZZA ) 80 MG capsule Take 1 capsule (80 mg total) by mouth daily. 05/26/23  Yes  tiZANidine  (ZANAFLEX ) 2 MG tablet Take 2 mg by mouth every 8 (eight) hours as needed for muscle spasms. Patient not taking: Reported on 08/28/2024    [provider]    Allergies: Clonazepam, Doxycycline , Fosinopril sodium, Hydrochlorothiazide -triamterene , Penicillins, Sulfa antibiotics, and Triamterene     Review of Systems  Updated Vital Signs BP (!) 101/58   Pulse 65   Temp 98.1 F (36.7 C) (Oral)   Resp 11   Wt 69.6 kg   SpO2 98%   BMI 28.06 kg/m   Physical  Exam Constitutional:      General: She is not in acute distress. HENT:     Head: Normocephalic and atraumatic.  Eyes:     Conjunctiva/sclera: Conjunctivae normal.     Pupils: Pupils are equal, round, and reactive to light.  Cardiovascular:     Rate and Rhythm: Normal rate and regular rhythm.  Pulmonary:     Effort: Pulmonary effort is normal. No respiratory distress.  Abdominal:     General: There is no distension.     Tenderness: There is no abdominal tenderness.  Skin:    General: Skin is warm and dry.  Neurological:     General: No focal deficit present.     Mental Status: She is alert. Mental status is at baseline.     Comments: Slurred speech     (all labs ordered are listed, but only abnormal results are displayed) Labs Reviewed  COMPREHENSIVE METABOLIC PANEL WITH GFR - Abnormal; Notable for the following components:      Result Value   Glucose, Bld 123 (*)    BUN 30 (*)    Creatinine, Ser 1.69 (*)    Total Protein 6.2 (*)    GFR, Estimated 32 (*)    All other components within normal limits  I-STAT CHEM 8, ED - Abnormal; Notable for the following components:   BUN 31 (*)    Creatinine, Ser 1.80 (*)    Glucose, Bld 132 (*)    All other components within normal limits  I-STAT VENOUS BLOOD GAS, ED - Abnormal; Notable for the following components:   pCO2, Ven 39.9 (*)    pO2, Ven 71 (*)    All other components within normal limits  CBG MONITORING, ED - Abnormal; Notable for the following components:   Glucose-Capillary 130 (*)    All other components within normal limits  CULTURE, BLOOD (ROUTINE X 2)  CULTURE, BLOOD (ROUTINE X 2)  CBC WITH DIFFERENTIAL/PLATELET  PROTIME-INR  APTT  ETHANOL  URINALYSIS, W/ REFLEX TO CULTURE (INFECTION SUSPECTED)  RAPID URINE DRUG SCREEN, HOSP PERFORMED  I-STAT CG4 LACTIC ACID, ED    EKG: EKG Interpretation Date/Time:  Tuesday August 28 2024 20:58:43 EST Ventricular Rate:  67 PR Interval:  205 QRS Duration:  94 QT  Interval:  426 QTC Calculation: 450 R Axis:   60  Text Interpretation: Sinus rhythm Abnormal R-wave progression, early transition Confirmed by Cottie Cough [phone]) on 08/28/2024 10:43:21 PM  Radiology: [facility] Chest Port 1 View Result Date: 08/28/2024 EXAM: 1 VIEW(S) XRAY OF THE CHEST 08/28/2024 07:59:22 PM COMPARISON: 12/25/2023 CLINICAL HISTORY: Questionable sepsis - evaluate for abnormality. FINDINGS: LUNGS AND PLEURA: Mild patchy bibasilar opacities likely atelectasis. No pulmonary edema. No pleural effusion. No pneumothorax. HEART AND MEDIASTINUM: No acute abnormality of the cardiac and mediastinal silhouettes. BONES AND SOFT TISSUES: No acute osseous abnormality. IMPRESSION: 1. Mild patchy bibasilar opacities, likely atelectasis. Electronically signed by: Pinkie Pebbles MD 08/28/2024 08:04 PM EST RP Workstation: [id number]  CT HEAD CODE STROKE WO CONTRAST` Result Date: 08/28/2024 CLINICAL DATA:  Code stroke. Initial evaluation for acute neuro deficit, stroke suspected. EXAM: CT HEAD WITHOUT CONTRAST TECHNIQUE: Contiguous axial images were obtained from the base of the skull through the vertex without intravenous contrast. RADIATION DOSE REDUCTION: This exam was performed according to the departmental dose-optimization program which includes automated exposure control, adjustment of the mA and/or kV according to patient size and/or use of iterative reconstruction technique. COMPARISON:  Comparison made with prior MRI from 08/30/2019. FINDINGS: Brain: Age-related cerebral atrophy with moderate chronic microvascular ischemic disease. Chronic right PCA distribution infarct involving the right occipital lobe noted. No acute intracranial hemorrhage. No acute large vessel territory infarct. No mass lesion or midline shift. No hydrocephalus or extra-axial fluid collection. Vascular: No abnormal hyperdense vessel. Calcified atherosclerosis present at the skull base. Skull: Scalp soft tissues demonstrate  no acute finding. Calvarium intact. Sinuses/Orbits: Globes and orbital soft tissues within normal limits. Paranasal sinuses are largely clear. No significant mastoid effusion. Other: None. ASPECTS [facility] Stroke Program Early CT Score) - Ganglionic level infarction (caudate, lentiform nuclei, internal capsule, insula, M1-M3 cortex): 7 - Supraganglionic infarction (M4-M6 cortex): 3 Total score (0-10 with 10 being normal): 10 IMPRESSION: 1. No acute intracranial abnormality. 2. ASPECTS is 10. 3. Moderate chronic microvascular ischemic disease with underlying chronic right PCA distribution infarct. These results were communicated to Dr. Vanessa at 7:22 pm on 08/28/2024 by text page via the [facility] messaging system. Electronically Signed   By: Morene Hoard M.D.   On: 08/28/2024 19:36     Procedures   Medications Ordered in the ED  methylPREDNISolone  sodium succinate (SOLU-MEDROL ) 125 mg/2 mL injection 125 mg (125 mg Intravenous Given 08/28/24 2307)  sodium chloride  0.9 % bolus 1,000 mL (1,000 mLs Intravenous New Bag/Given 08/28/24 2023)  diphenhydrAMINE  (BENADRYL ) injection 25 mg (25 mg Intravenous Given 08/28/24 2307)    Clinical Course as of 08/28/24 2310  Tue Aug 28, 2024  2309 Admitted to Dr Charlton hospitalist [MT]    Clinical Course User Index [MT] Cottie Donnice PARAS, MD                                 Medical Decision Making Amount and/or Complexity of Data Reviewed Labs: ordered. Radiology: ordered.  Risk Prescription drug management. Decision regarding hospitalization.   This patient presents to the Emergency Department with complaint of altered mental status.  This involves an extensive number of treatment options, and is a complaint that carries with it a high risk of complications and morbidity.  The differential diagnosis includes hypoglycemia vs metabolic encephalopathy vs infection (including cystitis) vs ICH vs stroke vs polypharmacy vs other  I ordered, reviewed,  and interpreted labs, including no emergent finding I ordered medication IV Benadryl  and steroids for tongue swelling I ordered imaging studies which included CT Head, dg chest I independently visualized and interpreted imaging which showed no emergent finding  Additional history was obtained from EMS I personally reviewed the patients ECG which showed sinus rhythm with no acute ischemic findings  I consulted neurology and discussed lab and imaging findings -no further neurological workup indicated  After the interventions stated above, I reevaluated the patient and found she remained stable  At 10 pm I was able to reach the patient's daughter by phone.  She confirms that the patient's enlarged tongue is not at her baseline status, and she feels that the patient had a  similar reaction few years. Clinically this seems more consistent to angioedema for me.  She does not have hives.  I did give the patient IV Solu-Medrol  and Benadryl , but is not believe she is requiring epinephrine .  I do think it be reasonable to keep her in observation overnight, to ensure improvement of her swelling, prior to discharge back to her facility.  Her daughter was in agreement.      Final diagnoses:  Slurred speech  Tongue swelling    ED Discharge Orders     None          Cottie Donnice PARAS, MD 08/28/24 2310

## 2024-08-28 NOTE — ED Triage Notes (Addendum)
 Pt coming from [facility] 1730 normally walks/talks. Pt expressed difficulty speaking. BP low per EMS. Pt lethargic but A&O x4. EMS VS: HR70 RR18 O2 97% bp 98/62 CBG 154

## 2024-08-29 ENCOUNTER — Other Ambulatory Visit: Payer: Self-pay

## 2024-08-29 DIAGNOSIS — R4781 Slurred speech: Secondary | ICD-10-CM | POA: Diagnosis present

## 2024-08-29 DIAGNOSIS — Z743 Need for continuous supervision: Secondary | ICD-10-CM | POA: Diagnosis not present

## 2024-08-29 DIAGNOSIS — R220 Localized swelling, mass and lump, head: Secondary | ICD-10-CM | POA: Diagnosis not present

## 2024-08-29 DIAGNOSIS — Z7401 Bed confinement status: Secondary | ICD-10-CM | POA: Diagnosis not present

## 2024-08-29 LAB — URINALYSIS, W/ REFLEX TO CULTURE (INFECTION SUSPECTED)
Bacteria, UA: NONE SEEN
Bilirubin Urine: NEGATIVE
Glucose, UA: NEGATIVE mg/dL
Hgb urine dipstick: NEGATIVE
Ketones, ur: NEGATIVE mg/dL
Nitrite: NEGATIVE
Protein, ur: NEGATIVE mg/dL
Specific Gravity, Urine: 1.02 (ref 1.005–1.030)
pH: 5 (ref 5.0–8.0)

## 2024-08-29 LAB — RAPID URINE DRUG SCREEN, HOSP PERFORMED
Amphetamines: NOT DETECTED
Barbiturates: NOT DETECTED
Benzodiazepines: NOT DETECTED
Cocaine: NOT DETECTED
Opiates: POSITIVE — AB
Tetrahydrocannabinol: NOT DETECTED

## 2024-08-29 LAB — BASIC METABOLIC PANEL WITH GFR
Anion gap: 13 (ref 5–15)
BUN: 25 mg/dL — ABNORMAL HIGH (ref 8–23)
CO2: 20 mmol/L — ABNORMAL LOW (ref 22–32)
Calcium: 9.2 mg/dL (ref 8.9–10.3)
Chloride: 108 mmol/L (ref 98–111)
Creatinine, Ser: 1.14 mg/dL — ABNORMAL HIGH (ref 0.44–1.00)
GFR, Estimated: 51 mL/min — ABNORMAL LOW (ref >60)
Glucose, Bld: 143 mg/dL — ABNORMAL HIGH (ref 70–99)
Potassium: 3.9 mmol/L (ref 3.5–5.1)
Sodium: 141 mmol/L (ref 135–145)

## 2024-08-29 LAB — CBC
HCT: 38.7 % (ref 36.0–46.0)
Hemoglobin: 12.7 g/dL (ref 12.0–15.0)
MCH: 30 pg (ref 26.0–34.0)
MCHC: 32.8 g/dL (ref 30.0–36.0)
MCV: 91.5 fL (ref 80.0–100.0)
Platelets: 250 K/uL (ref 150–400)
RBC: 4.23 MIL/uL (ref 3.87–5.11)
RDW: 13.2 % (ref 11.5–15.5)
WBC: 4.4 K/uL (ref 4.0–10.5)
nRBC: 0 % (ref 0.0–0.2)

## 2024-08-29 LAB — SODIUM, URINE, RANDOM: Sodium, Ur: 108 mmol/L

## 2024-08-29 LAB — CREATININE, URINE, RANDOM: Creatinine, Urine: 164 mg/dL

## 2024-08-29 MED ORDER — LATANOPROST 0.005 % OP SOLN
1.0000 [drp] | Freq: Every day | OPHTHALMIC | Status: DC
  Filled 2024-08-29: qty 2.5

## 2024-08-29 MED ORDER — QUETIAPINE FUMARATE 100 MG PO TABS
200.0000 mg | ORAL_TABLET | Freq: Every day | ORAL | Status: DC
  Administered 2024-08-29 – 2024-08-31 (×3): 200 mg via ORAL
  Filled 2024-08-29 (×3): qty 2

## 2024-08-29 MED ORDER — FAMOTIDINE 20 MG PO TABS
20.0000 mg | ORAL_TABLET | Freq: Every day | ORAL | Status: DC
  Administered 2024-08-29 – 2024-09-01 (×4): 20 mg via ORAL
  Filled 2024-08-29 (×4): qty 1

## 2024-08-29 MED ORDER — METHYLPREDNISOLONE SODIUM SUCC 40 MG IJ SOLR
40.0000 mg | Freq: Every day | INTRAMUSCULAR | Status: DC
  Administered 2024-08-29 – 2024-08-31 (×3): 40 mg via INTRAVENOUS
  Filled 2024-08-29 (×4): qty 1

## 2024-08-29 MED ORDER — PREGABALIN 50 MG PO CAPS
75.0000 mg | ORAL_CAPSULE | Freq: Three times a day (TID) | ORAL | Status: DC
  Administered 2024-08-29 – 2024-09-01 (×10): 75 mg via ORAL
  Filled 2024-08-29 (×2): qty 1
  Filled 2024-08-29: qty 3
  Filled 2024-08-29 (×7): qty 1

## 2024-08-29 MED ORDER — SODIUM CHLORIDE 0.9 % IV SOLN: INTRAVENOUS | Status: AC

## 2024-08-29 NOTE — ED Notes (Signed)
 Waiting on truck

## 2024-08-29 NOTE — ED Notes (Signed)
 Pt alert, NAD, calm, interactive, no changes. VSS. Pt now med/surg. Out with [facility]. Mentions pain, does not like tylenol , wants her home norco, admitting MD aware.

## 2024-08-29 NOTE — ED Notes (Signed)
 Admission RN in to see, at [facility]

## 2024-08-29 NOTE — Evaluation (Signed)
 Physical Therapy Evaluation Patient Details Name: Danielle [name] MRN: [id number] DOB: 11-Aug-1951 Today's Date: 08/29/2024  History of Present Illness  73 y.o. female presents to [facility] hospital on 08/28/2024 with lethargy and difficulty speaking. Pt also reports tongue swelling. PMH includes HTN, COPD, CAD, memory loss, schizoaffective disorder, depression, anxiety.  Clinical Impression  Pt presents to PT with deficits in activity tolerance, gait, balance. Pt is limited by reports of back pain, refusing ambulation away from bedside at this time. Pt performs is able to transfer without physical assistance and side steps at the edge of bed with good stability. PT anticipates the pt will progress well when agreeable to further activity, anticipating no post-acute PT needs. Acute PT will follow.        If plan is discharge home, recommend the following: A little help with bathing/dressing/bathroom;Assistance with cooking/housework;Assist for transportation   Can travel by private vehicle        Equipment Recommendations None recommended by PT  Recommendations for Other Services       Functional Status Assessment Patient has had a recent decline in their functional status and demonstrates the ability to make significant improvements in function in a reasonable and predictable amount of time.     Precautions / Restrictions Precautions Precautions: Fall Recall of Precautions/Restrictions: Intact Restrictions Weight Bearing Restrictions Per Provider Order: No      Mobility  Bed Mobility Overal bed mobility: Needs Assistance Bed Mobility: Supine to Sit, Sit to Supine     Supine to sit: Supervision Sit to supine: Min assist        Transfers Overall transfer level: Needs assistance Equipment used: Rollator (4 wheels) Transfers: Sit to/from Stand Sit to Stand: Supervision, From elevated surface                Ambulation/Gait Ambulation/Gait assistance: Supervision Gait  Distance (Feet): 2 Feet Assistive device: Rollator (4 wheels) Gait Pattern/deviations: Step-to pattern Gait velocity: reduced Gait velocity interpretation: <1.31 ft/sec, indicative of household ambulator   General Gait Details: pt declines ambulation away from bedside due to back pain despite PT encouragement. Pt does side step toward head of bed  Stairs            Wheelchair Mobility     Tilt Bed    Modified Rankin (Stroke Patients Only)       Balance Overall balance assessment: Needs assistance Sitting-balance support: No upper extremity supported, Feet supported Sitting balance-Leahy Scale: Good     Standing balance support: Single extremity supported, Reliant on assistive device for balance Standing balance-Leahy Scale: Poor                               Pertinent Vitals/Pain Pain Assessment Pain Assessment: 0-10 Pain Score: 6  Pain Location: low back Pain Descriptors / Indicators: Aching Pain Intervention(s): Monitored during session    Home Living Family/patient expects to be discharged to:: Assisted living                 Home Equipment: Rollator (4 wheels) Additional Comments: [facility]    Prior Function Prior Level of Function : Needs assist             Mobility Comments: Ambulates with rollator, no assist ADLs Comments: States she is able to bath/dress herself; staff prepare meals.     Extremity/Trunk Assessment   Upper Extremity Assessment Upper Extremity Assessment: Overall WFL for tasks assessed (tremors 2/2 tradive dyskinesia)  Lower Extremity Assessment Lower Extremity Assessment: Generalized weakness    Cervical / Trunk Assessment Cervical / Trunk Assessment: Normal  Communication   Communication Communication: No apparent difficulties    Cognition Arousal: Alert Behavior During Therapy: Flat affect   PT - Cognitive impairments: No family/caregiver present to determine baseline, Problem solving                          Following commands: Intact       Cueing Cueing Techniques: Verbal cues     General Comments General comments (skin integrity, edema, etc.): VSS on RA    Exercises     Assessment/Plan    PT Assessment Patient needs continued PT services  PT Problem List Decreased activity tolerance;Decreased balance;Decreased mobility;Pain       PT Treatment Interventions DME instruction;Gait training;Functional mobility training;Therapeutic activities;Therapeutic exercise;Balance training;Neuromuscular re-education;Patient/family education    PT Goals (Current goals can be found in the Care Plan section)  Acute Rehab PT Goals Patient Stated Goal: to return to ALF PT Goal Formulation: With patient Time For Goal Achievement: 09/12/24 Potential to Achieve Goals: Good    Frequency Min 1X/week     Co-evaluation               AM-PAC PT 6 Clicks Mobility  Outcome Measure Help needed turning from your back to your side while in a flat bed without using bedrails?: A Little Help needed moving from lying on your back to sitting on the side of a flat bed without using bedrails?: A Little Help needed moving to and from a bed to a chair (including a wheelchair)?: A Little Help needed standing up from a chair using your arms (e.g., wheelchair or bedside chair)?: A Little Help needed to walk in hospital room?: A Little Help needed climbing 3-5 steps with a railing? : A Lot 6 Click Score: 17    End of Session Equipment Utilized During Treatment: Gait belt Activity Tolerance: Patient limited by pain Patient left: in bed;with call bell/phone within reach Nurse Communication: Mobility status PT Visit Diagnosis: Other abnormalities of gait and mobility (R26.89)    Time: 8862-8842 PT Time Calculation (min) (ACUTE ONLY): 20 min   Charges:   PT Evaluation $PT Eval Low Complexity: 1 Low   PT General Charges $$ ACUTE PT VISIT: 1 Visit         Bernardino JINNY Ruth, PT, DPT Acute Rehabilitation Office [phone]   Bernardino JINNY Ruth 08/29/2024, 12:19 PM

## 2024-08-29 NOTE — ED Notes (Signed)
 [facility] called for transfer to [facility] rm 1531. ETA 1.5 hours

## 2024-08-29 NOTE — ED Notes (Signed)
 Bed pan urine output . Pt clean and dry

## 2024-08-29 NOTE — ED Notes (Signed)
[facility] at [facility].

## 2024-08-29 NOTE — ED Notes (Addendum)
 Pending [facility] arrival for transport to [facility]. PT at [facility].

## 2024-08-29 NOTE — Progress Notes (Signed)
 [facility]   Bed Availability Yes  Level of Care Needed:  Yes  MD Agree to transfer: Yes  Patient agree to transfer: Yes  Sharolyn Batman, RN [facility]

## 2024-08-29 NOTE — Plan of Care (Signed)

## 2024-08-29 NOTE — ED Notes (Signed)
 Admission RN at [facility]

## 2024-08-29 NOTE — ED Notes (Signed)
 Pt placed on bedpan

## 2024-08-29 NOTE — Progress Notes (Signed)
 OT Cancellation Note  Patient Details Name: Danielle [name] MRN: [id number] DOB: 11/27/1950   Cancelled Treatment:    Reason Eval/Treat Not Completed:  Pt preparing to transfer to [facility]. Will continue to follow.   Kennth Mliss Helling 08/29/2024, 12:11 PM Mliss HERO, OTR/L Acute Rehabilitation Services Office: [phone]

## 2024-08-29 NOTE — Progress Notes (Signed)
 PROGRESS NOTE    Danielle [name]  FMW:[id number] DOB: Jul 19, 1951 DOA: 08/28/2024 PCP: [facility]   Brief Narrative:  HPI: Danielle [name] is a 73 y.o. female with medical history significant for hypertension, COPD, CAD, memory loss, schizoaffective disorder, depression, anxiety, and tardive dyskinesia who presents with lethargy and difficulty speaking.   Patient was noted to be lethargic at her facility today and was having difficulty speaking.  EMS was called, SBP was said to be in the 70s initially but improved to the upper 90s, and patient was brought into the ED as a code stroke.   Patient feels that her tongue is swollen but denies any difficulty breathing or swallowing, denies any rash or itching, and denies any abdominal pain, nausea, vomiting, chest pain, or lightheadedness.  She does not believe she has been on any new medications recently.   ED Course: Upon arrival to the ED, patient is found to be afebrile and saturating well on room air with normal HR and stable BP.  Labs are most notable for creatinine 1.69, normal CBC, and normal lactic acid.  There are no acute findings on head CT.   Patient was evaluated by neurology who had low suspicion for stroke.  Blood cultures were collected and the patient was given a liter of NS, IV Solu-Medrol , and IV Benadryl .  BP and clinical status have remained stable.  Assessment & Plan:   Principal Problem:   Tongue swelling Active Problems:   ANXIETY DISORDER, GENERALIZED   HYPERTENSION, BENIGN ESSENTIAL   COPD (chronic obstructive pulmonary disease) (HCC)   Memory loss   CAD (coronary artery disease)   AKI (acute kidney injury)   Major depressive disorder, recurrent episode, moderate (HCC)   1. Tongue swelling  - BP is stable and there is no respiratory or swallowing difficulty, no urticaria/pruritus, no ACE or NSAID use, and GI sxs and no recent new medication. - Checking C4, C1 esterase inhibitor, and C1q,  continue close monitoring of airway.  Per patient tongue swelling is better but not back to baseline.  His speech is better but not back to baseline.  I am unable to differentiate since I had did not see her yesterday.  Will continue Solu-Medrol  but decrease the dose to 40 mg and placed on famotidine .  Observe another night.   2. AKI  - SCr is 1.69, up from 0.92 in March 2025.  Given some fluids, improved to 1.14.  Will give her 1 more liter over 10 hours and repeat labs in the morning.   3. Schizoaffective disorder; depression; anxiety; tardive dyskinesia continue Ingrezza , Zoloft , Buspar and resume Seroquel .   4. COPD  - Not in exacerbation   - Continue ICS-LAMA-LABA and as-needed albuterol      5. Hypertension  - BP low-normal in ED and antihypertensives held on admission, blood pressure still low.   6. HLD  - Continue Lipitor    7. Memory loss  - Delirium precautions   8.  Generalized weakness: Patient appears to be very weak and deconditioned.  Might need SNF.  Consult PT OT.  DVT prophylaxis: SCDs Start: 08/28/24 2330   Code Status: Full Code  Family Communication:  None present at bedside.  Plan of care discussed with patient in length and he/she verbalized understanding and agreed with it.  Status is: Observation The patient will require care spanning > 2 midnights and should be moved to inpatient because: Need to be observed overnight, very weak.  Needs some fluids.   Estimated body  mass index is 28.06 kg/m as calculated from the following:   Height as of 04/06/24: 5' 2 (1.575 m).   Weight as of this encounter: 69.6 kg.    Nutritional Assessment: Body mass index is 28.06 kg/m.Danielle [name] Seen by dietician.  I agree with the assessment and plan as outlined below: Nutrition Status:        . Skin Assessment: I have examined the patient's skin and I agree with the wound assessment as performed by the wound care RN as outlined below:    Consultants:  None  Procedures:   None  Antimicrobials:  Anti-infectives (From admission, onward)    None         Subjective: Seen and examined in the ED, she says that she is feeling better than yesterday but still has some slurred speech with tongue swelling.  She does not think she is ready to go to assisted living facility.  Feels very weak.  Objective: Vitals:   08/29/24 0900 08/29/24 0915 08/29/24 0930 08/29/24 0942  BP: 116/64 122/71 107/78   Pulse:      Resp: 18 19 (!) 21   Temp:    98.2 F (36.8 C)  TempSrc:    Oral  SpO2: 100% 100% 100%   Weight:       No intake or output data in the 24 hours ending 08/29/24 1029 Filed Weights   08/28/24 1921  Weight: 69.6 kg    Examination:  General exam: Appears calm and comfortable  Respiratory system: Clear to auscultation. Respiratory effort normal. Cardiovascular system: S1 & S2 heard, RRR. No JVD, murmurs, rubs, gallops or clicks. No pedal edema. Gastrointestinal system: Abdomen is nondistended, soft and nontender. No organomegaly or masses felt. Normal bowel sounds heard. Central nervous system: Alert and oriented. No focal neurological deficits. Extremities: Symmetric 5 x 5 power. Skin: No rashes, lesions or ulcers Psychiatry: Judgement and insight appear normal. Mood & affect appropriate.    Data Reviewed: I have personally reviewed following labs and imaging studies  CBC: Recent Labs  Lab 08/28/24 2010 08/28/24 2013 08/29/24 0632  WBC 6.5  --  4.4  NEUTROABS 2.7  --   --   HGB 12.9 13.3  13.6 12.7  HCT 38.8 39.0  40.0 38.7  MCV 91.3  --  91.5  PLT 242  --  250   Basic Metabolic Panel: Recent Labs  Lab 08/28/24 2010 08/28/24 2013 08/29/24 0632  NA 140 141  140 141  K 3.6 3.5  3.5 3.9  CL 103 105 108  CO2 22  --  20*  GLUCOSE 123* 132* 143*  BUN 30* 31* 25*  CREATININE 1.69* 1.80* 1.14*  CALCIUM  9.6  --  9.2   GFR: Estimated Creatinine Clearance: 41.4 mL/min (A) (by C-G formula based on SCr of 1.14 mg/dL (H)). Liver  Function Tests: Recent Labs  Lab 08/28/24 2010  AST 17  ALT 12  ALKPHOS 60  BILITOT 0.5  PROT 6.2*  ALBUMIN 3.7   No results for input(s): LIPASE, AMYLASE in the last 168 hours. No results for input(s): AMMONIA in the last 168 hours. Coagulation Profile: Recent Labs  Lab 08/28/24 2010  INR 1.0   Cardiac Enzymes: No results for input(s): CKTOTAL, CKMB, CKMBINDEX, TROPONINI in the last 168 hours. BNP (last 3 results) No results for input(s): PROBNP in the last 8760 hours. HbA1C: No results for input(s): HGBA1C in the last 72 hours. CBG: Recent Labs  Lab 08/28/24 1957  GLUCAP 130*   Lipid  Profile: No results for input(s): CHOL, HDL, LDLCALC, TRIG, CHOLHDL, LDLDIRECT in the last 72 hours. Thyroid  Function Tests: No results for input(s): TSH, T4TOTAL, FREET4, T3FREE, THYROIDAB in the last 72 hours. Anemia Panel: No results for input(s): VITAMINB12, FOLATE, FERRITIN, TIBC, IRON, RETICCTPCT in the last 72 hours. Sepsis Labs: Recent Labs  Lab 08/28/24 2014  LATICACIDVEN 1.9    Recent Results (from the past 240 hours)  Blood Culture (routine x 2)     Status: None (Preliminary result)   Collection Time: 08/28/24  7:30 PM   Specimen: BLOOD  Result Value Ref Range Status   Specimen Description BLOOD LEFT ANTECUBITAL  Final   Special Requests   Final    BOTTLES DRAWN AEROBIC AND ANAEROBIC Blood Culture results may not be optimal due to an inadequate volume of blood received in culture bottles   Culture   Final    NO GROWTH < 12 HOURS Performed at [facility], [address] [place], [place] [address]    Report Status PENDING  Incomplete  Blood Culture (routine x 2)     Status: None (Preliminary result)   Collection Time: 08/28/24  8:00 PM   Specimen: BLOOD  Result Value Ref Range Status   Specimen Description BLOOD LEFT ANTECUBITAL  Final   Special Requests   Final    BOTTLES DRAWN AEROBIC AND ANAEROBIC  Blood Culture results may not be optimal due to an inadequate volume of blood received in culture bottles   Culture   Final    NO GROWTH < 12 HOURS Performed at [facility], [address] [place], [place] [address]    Report Status PENDING  Incomplete     Radiology Studies: DG Chest Port 1 View Result Date: 08/28/2024 EXAM: 1 VIEW(S) XRAY OF THE CHEST 08/28/2024 07:59:22 PM COMPARISON: 12/25/2023 CLINICAL HISTORY: Questionable sepsis - evaluate for abnormality. FINDINGS: LUNGS AND PLEURA: Mild patchy bibasilar opacities likely atelectasis. No pulmonary edema. No pleural effusion. No pneumothorax. HEART AND MEDIASTINUM: No acute abnormality of the cardiac and mediastinal silhouettes. BONES AND SOFT TISSUES: No acute osseous abnormality. IMPRESSION: 1. Mild patchy bibasilar opacities, likely atelectasis. Electronically signed by: Pinkie Pebbles MD 08/28/2024 08:04 PM EST RP Workstation: [id number]   CT HEAD CODE STROKE WO CONTRAST` Result Date: 08/28/2024 CLINICAL DATA:  Code stroke. Initial evaluation for acute neuro deficit, stroke suspected. EXAM: CT HEAD WITHOUT CONTRAST TECHNIQUE: Contiguous axial images were obtained from the base of the skull through the vertex without intravenous contrast. RADIATION DOSE REDUCTION: This exam was performed according to the departmental dose-optimization program which includes automated exposure control, adjustment of the mA and/or kV according to patient size and/or use of iterative reconstruction technique. COMPARISON:  Comparison made with prior MRI from 08/30/2019. FINDINGS: Brain: Age-related cerebral atrophy with moderate chronic microvascular ischemic disease. Chronic right PCA distribution infarct involving the right occipital lobe noted. No acute intracranial hemorrhage. No acute large vessel territory infarct. No mass lesion or midline shift. No hydrocephalus or extra-axial fluid collection. Vascular: No abnormal hyperdense vessel. Calcified  atherosclerosis present at the skull base. Skull: Scalp soft tissues demonstrate no acute finding. Calvarium intact. Sinuses/Orbits: Globes and orbital soft tissues within normal limits. Paranasal sinuses are largely clear. No significant mastoid effusion. Other: None. ASPECTS [facility] Stroke Program Early CT Score) - Ganglionic level infarction (caudate, lentiform nuclei, internal capsule, insula, M1-M3 cortex): 7 - Supraganglionic infarction (M4-M6 cortex): 3 Total score (0-10 with 10 being normal): 10 IMPRESSION: 1. No acute intracranial abnormality.  2. ASPECTS is 10. 3. Moderate chronic microvascular ischemic disease with underlying chronic right PCA distribution infarct. These results were communicated to Dr. Vanessa at 7:22 pm on 08/28/2024 by text page via the [facility] messaging system. Electronically Signed   By: Morene Hoard M.D.   On: 08/28/2024 19:36    Scheduled Meds:  atorvastatin   10 mg Oral Daily   budesonide -glycopyrrolate-formoterol   2 puff Inhalation BID   busPIRone  15 mg Oral TID   methylPREDNISolone  (SOLU-MEDROL ) injection  125 mg Intravenous Daily   nicotine   21 mg Transdermal Daily   sertraline   100 mg Oral Daily   sodium chloride  flush  3 mL Intravenous Q12H   valbenazine   80 mg Oral Daily   Continuous Infusions:   LOS: 0 days   Fredia Skeeter, MD [facility]  08/29/2024, 10:29 AM   *Please note that this is a verbal dictation therefore any spelling or grammatical errors are due to the Dragon Medical One system interpretation.  Please page via Amion and do not message via secure chat for urgent patient care matters. Secure chat can be used for non urgent patient care matters.  How to contact the [facility] Attending or Consulting provider 7A - 7P or covering provider during after hours 7P -7A, for this patient?  Check the care team in [facility] and look for a) attending/consulting [facility] provider listed and b) the [facility] team listed. Page or secure chat 7A-7P. Log into  [email or web address] and use [facility] universal password to access. If you do not have the password, please contact the hospital operator. Locate the [facility] provider you are looking for under [facility] and page to a number that you can be directly reached. If you still have difficulty reaching the provider, please page the [facility] (Director on Call) for the Hospitalists listed on amion for assistance.

## 2024-08-29 NOTE — ED Notes (Signed)
 Pt speaking with DSS call in (on phone) re: investigation and permission to speak with insurance company

## 2024-08-30 DIAGNOSIS — Z9079 Acquired absence of other genital organ(s): Secondary | ICD-10-CM | POA: Diagnosis not present

## 2024-08-30 DIAGNOSIS — N179 Acute kidney failure, unspecified: Secondary | ICD-10-CM | POA: Diagnosis present

## 2024-08-30 DIAGNOSIS — J4489 Other specified chronic obstructive pulmonary disease: Secondary | ICD-10-CM | POA: Diagnosis present

## 2024-08-30 DIAGNOSIS — I2510 Atherosclerotic heart disease of native coronary artery without angina pectoris: Secondary | ICD-10-CM | POA: Diagnosis present

## 2024-08-30 DIAGNOSIS — X58XXXA Exposure to other specified factors, initial encounter: Secondary | ICD-10-CM | POA: Diagnosis present

## 2024-08-30 DIAGNOSIS — F17210 Nicotine dependence, cigarettes, uncomplicated: Secondary | ICD-10-CM | POA: Diagnosis present

## 2024-08-30 DIAGNOSIS — Z7951 Long term (current) use of inhaled steroids: Secondary | ICD-10-CM | POA: Diagnosis not present

## 2024-08-30 DIAGNOSIS — Z888 Allergy status to other drugs, medicaments and biological substances status: Secondary | ICD-10-CM | POA: Diagnosis not present

## 2024-08-30 DIAGNOSIS — K219 Gastro-esophageal reflux disease without esophagitis: Secondary | ICD-10-CM | POA: Diagnosis present

## 2024-08-30 DIAGNOSIS — R413 Other amnesia: Secondary | ICD-10-CM | POA: Diagnosis present

## 2024-08-30 DIAGNOSIS — Z882 Allergy status to sulfonamides status: Secondary | ICD-10-CM | POA: Diagnosis not present

## 2024-08-30 DIAGNOSIS — R531 Weakness: Secondary | ICD-10-CM | POA: Diagnosis present

## 2024-08-30 DIAGNOSIS — F411 Generalized anxiety disorder: Secondary | ICD-10-CM | POA: Diagnosis present

## 2024-08-30 DIAGNOSIS — R220 Localized swelling, mass and lump, head: Secondary | ICD-10-CM | POA: Diagnosis not present

## 2024-08-30 DIAGNOSIS — Z8249 Family history of ischemic heart disease and other diseases of the circulatory system: Secondary | ICD-10-CM | POA: Diagnosis not present

## 2024-08-30 DIAGNOSIS — Z880 Allergy status to penicillin: Secondary | ICD-10-CM | POA: Diagnosis not present

## 2024-08-30 DIAGNOSIS — E785 Hyperlipidemia, unspecified: Secondary | ICD-10-CM | POA: Diagnosis present

## 2024-08-30 DIAGNOSIS — Z79899 Other long term (current) drug therapy: Secondary | ICD-10-CM | POA: Diagnosis not present

## 2024-08-30 DIAGNOSIS — Z8673 Personal history of transient ischemic attack (TIA), and cerebral infarction without residual deficits: Secondary | ICD-10-CM | POA: Diagnosis not present

## 2024-08-30 DIAGNOSIS — G2401 Drug induced subacute dyskinesia: Secondary | ICD-10-CM | POA: Diagnosis present

## 2024-08-30 DIAGNOSIS — Z881 Allergy status to other antibiotic agents status: Secondary | ICD-10-CM | POA: Diagnosis not present

## 2024-08-30 DIAGNOSIS — F259 Schizoaffective disorder, unspecified: Secondary | ICD-10-CM | POA: Diagnosis present

## 2024-08-30 DIAGNOSIS — T783XXA Angioneurotic edema, initial encounter: Secondary | ICD-10-CM | POA: Diagnosis present

## 2024-08-30 DIAGNOSIS — Z823 Family history of stroke: Secondary | ICD-10-CM | POA: Diagnosis not present

## 2024-08-30 DIAGNOSIS — F319 Bipolar disorder, unspecified: Secondary | ICD-10-CM | POA: Diagnosis present

## 2024-08-30 DIAGNOSIS — I10 Essential (primary) hypertension: Secondary | ICD-10-CM | POA: Diagnosis present

## 2024-08-30 LAB — BASIC METABOLIC PANEL WITH GFR
Anion gap: 11 (ref 5–15)
BUN: 14 mg/dL (ref 8–23)
CO2: 21 mmol/L — ABNORMAL LOW (ref 22–32)
Calcium: 9.9 mg/dL (ref 8.9–10.3)
Chloride: 110 mmol/L (ref 98–111)
Creatinine, Ser: 0.74 mg/dL (ref 0.44–1.00)
GFR, Estimated: >60 mL/min (ref >60)
Glucose, Bld: 111 mg/dL — ABNORMAL HIGH (ref 70–99)
Potassium: 4 mmol/L (ref 3.5–5.1)
Sodium: 142 mmol/L (ref 135–145)

## 2024-08-30 LAB — C1 ESTERASE INHIBITOR: C1INH SerPl-mCnc: 35 mg/dL (ref 21–39)

## 2024-08-30 MED ORDER — HYDROCODONE-ACETAMINOPHEN 5-325 MG PO TABS
1.0000 | ORAL_TABLET | Freq: Four times a day (QID) | ORAL | Status: DC | PRN
  Administered 2024-08-30 – 2024-09-01 (×4): 1 via ORAL
  Filled 2024-08-30 (×4): qty 1

## 2024-08-30 MED ORDER — HYDROCODONE-ACETAMINOPHEN 5-325 MG PO TABS
1.0000 | ORAL_TABLET | Freq: Once | ORAL | Status: AC
  Administered 2024-08-30: 1 via ORAL
  Filled 2024-08-30: qty 1

## 2024-08-30 NOTE — Plan of Care (Signed)

## 2024-08-30 NOTE — Progress Notes (Signed)
 PROGRESS NOTE    Glady Ouderkirk  FMW:[id number] DOB: 08-15-1951 DOA: 08/28/2024 PCP: [facility]   Brief Narrative:  HPI: Danielle [name] is a 73 y.o. female with medical history significant for hypertension, COPD, CAD, memory loss, schizoaffective disorder, depression, anxiety, and tardive dyskinesia who presents with lethargy and difficulty speaking.   Patient was noted to be lethargic at her facility today and was having difficulty speaking.  EMS was called, SBP was said to be in the 70s initially but improved to the upper 90s, and patient was brought into the ED as a code stroke.   Patient feels that her tongue is swollen but denies any difficulty breathing or swallowing, denies any rash or itching, and denies any abdominal pain, nausea, vomiting, chest pain, or lightheadedness.  She does not believe she has been on any new medications recently.   ED Course: Upon arrival to the ED, patient is found to be afebrile and saturating well on room air with normal HR and stable BP.  Labs are most notable for creatinine 1.69, normal CBC, and normal lactic acid.  There are no acute findings on head CT.   Patient was evaluated by neurology who had low suspicion for stroke.  Blood cultures were collected and the patient was given a liter of NS, IV Solu-Medrol , and IV Benadryl .  BP and clinical status have remained stable.  Assessment & Plan:   Principal Problem:   Tongue swelling Active Problems:   ANXIETY DISORDER, GENERALIZED   HYPERTENSION, BENIGN ESSENTIAL   COPD (chronic obstructive pulmonary disease) (HCC)   Memory loss   CAD (coronary artery disease)   AKI (acute kidney injury)   Major depressive disorder, recurrent episode, moderate (HCC)   1. Tongue swelling  - BP is stable and there is no respiratory or swallowing difficulty, no urticaria/pruritus, no ACE or NSAID use, and GI sxs and no recent new medication. - Checking C4, C1 esterase inhibitor, and C1q,  continue close monitoring of airway.  Will continue Solu-Medrol  decrease the dose to 20 mg and placed on famotidine .     2. AKI  - -Resolved with IV fluids    3. Schizoaffective disorder; depression; anxiety; tardive dyskinesia continue Ingrezza , Zoloft , Buspar and resume Seroquel .   4. COPD  - Not in exacerbation   - Continue ICS-LAMA-LABA and as-needed albuterol      5. Hypertension  - BP low-normal in ED and antihypertensives held on admission, blood pressure still low.   6. HLD  - Continue Lipitor    7. Memory loss  - Delirium precautions   8.  Generalized weakness: Patient appears to be very weak and deconditioned.  Might need SNF.  Consult PT OT.  DVT prophylaxis: SCDs Start: 08/28/24 2330   Code Status: Full Code  Family Communication:  None present at bedside.  Plan of care discussed with patient in length and he/she verbalized understanding and agreed with it.  Status is: Observation The patient will require care spanning > 2 midnights and should be moved to inpatient because: Patient is still very weak.  May need SNF   Estimated body mass index is 28.87 kg/m as calculated from the following:   Height as of this encounter: 5' 1 (1.549 m).   Weight as of this encounter: 69.3 kg.    Nutritional Assessment: Body mass index is 28.87 kg/m.[name] Seen by dietician.  I agree with the assessment and plan as outlined below: Nutrition Status:        . Skin Assessment: I  have examined the patient's skin and I agree with the wound assessment as performed by the wound care RN as outlined below:    Consultants:  None  Procedures:  None  Antimicrobials:  Anti-infectives (From admission, onward)    None         Subjective: Seen and examined at bedside, patient states her tongue swelling is better but not back to baseline.  His speech is better but not back to baseline. She still sounds garbles  Objective: Vitals:   08/30/24 0036 08/30/24 0156 08/30/24 0529  08/30/24 0848  BP:  111/66 130/79   Pulse:  87 77   Resp:  15 16   Temp:  98.5 F (36.9 C) 98.2 F (36.8 C)   TempSrc:  Oral Oral   SpO2: 99% 90% 98% 97%  Weight:      Height:        Intake/Output Summary (Last 24 hours) at 08/30/2024 1004 Last data filed at 08/30/2024 0800 Gross per 24 hour  Intake 2462.09 ml  Output 1350 ml  Net 1112.09 ml   Filed Weights   08/28/24 1921 08/29/24 1523  Weight: 69.6 kg 69.3 kg    Examination:  General exam: Appears calm and comfortable  Respiratory system: Clear to auscultation. Respiratory effort normal. Cardiovascular system: S1 & S2 heard, RRR. No JVD, murmurs, rubs, gallops or clicks. No pedal edema. Gastrointestinal system: Abdomen is nondistended, soft and nontender. No organomegaly or masses felt. Normal bowel sounds heard. Central nervous system: Alert and oriented. No focal neurological deficits. Extremities: Symmetric 5 x 5 power. Skin: No rashes, lesions or ulcers Psychiatry: Judgement and insight appear normal. Mood & affect appropriate.    Data Reviewed: I have personally reviewed following labs and imaging studies  CBC: Recent Labs  Lab 08/28/24 2010 08/28/24 2013 08/29/24 0632  WBC 6.5  --  4.4  NEUTROABS 2.7  --   --   HGB 12.9 13.3  13.6 12.7  HCT 38.8 39.0  40.0 38.7  MCV 91.3  --  91.5  PLT 242  --  250   Basic Metabolic Panel: Recent Labs  Lab 08/28/24 2010 08/28/24 2013 08/29/24 0632 08/30/24 0436  NA 140 141  140 141 142  K 3.6 3.5  3.5 3.9 4.0  CL 103 105 108 110  CO2 22  --  20* 21*  GLUCOSE 123* 132* 143* 111*  BUN 30* 31* 25* 14  CREATININE 1.69* 1.80* 1.14* 0.74  CALCIUM  9.6  --  9.2 9.9   GFR: Estimated Creatinine Clearance: 57.4 mL/min (by C-G formula based on SCr of 0.74 mg/dL). Liver Function Tests: Recent Labs  Lab 08/28/24 2010  AST 17  ALT 12  ALKPHOS 60  BILITOT 0.5  PROT 6.2*  ALBUMIN 3.7   No results for input(s): LIPASE, AMYLASE in the last 168 hours. No  results for input(s): AMMONIA in the last 168 hours. Coagulation Profile: Recent Labs  Lab 08/28/24 2010  INR 1.0   Cardiac Enzymes: No results for input(s): CKTOTAL, CKMB, CKMBINDEX, TROPONINI in the last 168 hours. BNP (last 3 results) No results for input(s): PROBNP in the last 8760 hours. HbA1C: No results for input(s): HGBA1C in the last 72 hours. CBG: Recent Labs  Lab 08/28/24 1957  GLUCAP 130*   Lipid Profile: No results for input(s): CHOL, HDL, LDLCALC, TRIG, CHOLHDL, LDLDIRECT in the last 72 hours. Thyroid  Function Tests: No results for input(s): TSH, T4TOTAL, FREET4, T3FREE, THYROIDAB in the last 72 hours. Anemia Panel: No results  for input(s): VITAMINB12, FOLATE, FERRITIN, TIBC, IRON, RETICCTPCT in the last 72 hours. Sepsis Labs: Recent Labs  Lab 08/28/24 2014  LATICACIDVEN 1.9    Recent Results (from the past 240 hours)  Blood Culture (routine x 2)     Status: None (Preliminary result)   Collection Time: 08/28/24  7:30 PM   Specimen: BLOOD  Result Value Ref Range Status   Specimen Description BLOOD LEFT ANTECUBITAL  Final   Special Requests   Final    BOTTLES DRAWN AEROBIC AND ANAEROBIC Blood Culture results may not be optimal due to an inadequate volume of blood received in culture bottles   Culture   Final    NO GROWTH 2 DAYS Performed at [facility], [address], [place], [place] [address]    Report Status PENDING  Incomplete  Blood Culture (routine x 2)     Status: None (Preliminary result)   Collection Time: 08/28/24  8:00 PM   Specimen: BLOOD  Result Value Ref Range Status   Specimen Description BLOOD LEFT ANTECUBITAL  Final   Special Requests   Final    BOTTLES DRAWN AEROBIC AND ANAEROBIC Blood Culture results may not be optimal due to an inadequate volume of blood received in culture bottles   Culture   Final    NO GROWTH 2 DAYS Performed at [facility], [address]  [place], [place] [address]    Report Status PENDING  Incomplete     Radiology Studies: DG Chest Port 1 View Result Date: 08/28/2024 EXAM: 1 VIEW(S) XRAY OF THE CHEST 08/28/2024 07:59:22 PM COMPARISON: 12/25/2023 CLINICAL HISTORY: Questionable sepsis - evaluate for abnormality. FINDINGS: LUNGS AND PLEURA: Mild patchy bibasilar opacities likely atelectasis. No pulmonary edema. No pleural effusion. No pneumothorax. HEART AND MEDIASTINUM: No acute abnormality of the cardiac and mediastinal silhouettes. BONES AND SOFT TISSUES: No acute osseous abnormality. IMPRESSION: 1. Mild patchy bibasilar opacities, likely atelectasis. Electronically signed by: Pinkie Pebbles MD 08/28/2024 08:04 PM EST RP Workstation: [id number]   CT HEAD CODE STROKE WO CONTRAST` Result Date: 08/28/2024 CLINICAL DATA:  Code stroke. Initial evaluation for acute neuro deficit, stroke suspected. EXAM: CT HEAD WITHOUT CONTRAST TECHNIQUE: Contiguous axial images were obtained from the base of the skull through the vertex without intravenous contrast. RADIATION DOSE REDUCTION: This exam was performed according to the departmental dose-optimization program which includes automated exposure control, adjustment of the mA and/or kV according to patient size and/or use of iterative reconstruction technique. COMPARISON:  Comparison made with prior MRI from 08/30/2019. FINDINGS: Brain: Age-related cerebral atrophy with moderate chronic microvascular ischemic disease. Chronic right PCA distribution infarct involving the right occipital lobe noted. No acute intracranial hemorrhage. No acute large vessel territory infarct. No mass lesion or midline shift. No hydrocephalus or extra-axial fluid collection. Vascular: No abnormal hyperdense vessel. Calcified atherosclerosis present at the skull base. Skull: Scalp soft tissues demonstrate no acute finding. Calvarium intact. Sinuses/Orbits: Globes and orbital soft tissues within normal limits. Paranasal sinuses  are largely clear. No significant mastoid effusion. Other: None. ASPECTS [facility] Stroke Program Early CT Score) - Ganglionic level infarction (caudate, lentiform nuclei, internal capsule, insula, M1-M3 cortex): 7 - Supraganglionic infarction (M4-M6 cortex): 3 Total score (0-10 with 10 being normal): 10 IMPRESSION: 1. No acute intracranial abnormality. 2. ASPECTS is 10. 3. Moderate chronic microvascular ischemic disease with underlying chronic right PCA distribution infarct. These results were communicated to Dr. Vanessa at 7:22 pm on 08/28/2024 by text page via the [facility] messaging system. Electronically Signed  By: Morene Hoard M.D.   On: 08/28/2024 19:36    Scheduled Meds:  atorvastatin   10 mg Oral Daily   budesonide -glycopyrrolate-formoterol   2 puff Inhalation BID   busPIRone  15 mg Oral TID   famotidine   20 mg Oral Daily   latanoprost   1 drop Both Eyes Daily   methylPREDNISolone  (SOLU-MEDROL ) injection  40 mg Intravenous Daily   nicotine   21 mg Transdermal Daily   pregabalin   75 mg Oral TID   QUEtiapine   200 mg Oral QHS   sertraline   100 mg Oral Daily   sodium chloride  flush  3 mL Intravenous Q12H   valbenazine   80 mg Oral Daily  I have reviewed pertinent nursing notes, vitals, labs, and images as necessary. I have ordered labwork to follow up on as indicated.  I have reviewed the last notes from staff over past 24 hours. I have discussed patient's care plan and test results with nursing staff, CM/SW, and other staff as appropriate.  Old records reviewed in assessment of this patient  Time spent: Greater than 50% of the 55 minute visit was spent in counseling/coordination of care for the patient as laid out in the A&P.     LOS: 0 days   Landon FORBES Baller, MD [facility]  08/30/2024, 10:04 AM

## 2024-08-30 NOTE — Care Management Obs Status (Signed)
 MEDICARE OBSERVATION STATUS NOTIFICATION   Patient Details  Name: Danielle [name] MRN: [id number] Date of Birth: 1951/09/21   Medicare Observation Status Notification Given:  Yes    Doneta Glenys DASEN, RN 08/30/2024, 10:27 AM

## 2024-08-30 NOTE — Evaluation (Signed)
 Occupational Therapy Evaluation and Discharge Patient Details Name: Danielle [name] MRN: [id number] DOB: August 28, 1951 Today's Date: 08/30/2024   History of Present Illness   73 y.o. female presents to [facility] hospital on 08/28/2024 with lethargy and difficulty speaking. Pt also reports tongue swelling. PMH includes HTN, COPD, CAD, memory loss, schizoaffective disorder, depression, anxiety.     Clinical Impressions Pt with inconsistent report of whether she uses AD for ambulation at her ALF. Declined use of RW to ambulate to bathroom this visit and requested hand held assist in hallway. Pt is overall functioning at a set up to supervision level in ADLs. Recommend ADLs with nursing staff, pt likely very near her baseline, and return to ALF upon discharge.     If plan is discharge home, recommend the following:   A little help with walking and/or transfers;A little help with bathing/dressing/bathroom;Direct supervision/assist for medications management;Direct supervision/assist for financial management;Assist for transportation;Help with stairs or ramp for entrance;Assistance with cooking/housework     Functional Status Assessment   Patient has had a recent decline in their functional status and demonstrates the ability to make significant improvements in function in a reasonable and predictable amount of time.     Equipment Recommendations   None recommended by OT     Recommendations for Other Services         Precautions/Restrictions   Precautions Precautions: Fall Recall of Precautions/Restrictions: Intact Restrictions Weight Bearing Restrictions Per Provider Order: No     Mobility Bed Mobility Overal bed mobility: Modified Independent                  Transfers Overall transfer level: Needs assistance Equipment used: None Transfers: Sit to/from Stand Sit to Stand: Supervision                  Balance Overall balance assessment: Needs  assistance   Sitting balance-Leahy Scale: Good     Standing balance support: No upper extremity supported Standing balance-Leahy Scale: Fair                             ADL either performed or assessed with clinical judgement   ADL Overall ADL's : Needs assistance/impaired Eating/Feeding: Independent   Grooming: Wash/dry hands;Standing;Supervision/safety   Upper Body Bathing: Supervision/ safety;Standing   Lower Body Bathing: Supervison/ safety;Sit to/from stand   Upper Body Dressing : Set up;Sitting   Lower Body Dressing: Set up;Sitting/lateral leans   Toilet Transfer: Supervision/safety;Ambulation   Toileting- Clothing Manipulation and Hygiene: Supervision/safety;Sit to/from stand       Functional mobility during ADLs: Supervision/safety;Minimal assistance General ADL Comments: supervision to ambulate in room, hand held in hall     Vision Ability to See in Adequate Light: 0 Adequate Patient Visual Report: No change from baseline       Perception         Praxis         Pertinent Vitals/Pain Pain Assessment Pain Assessment: Faces Faces Pain Scale: Hurts a little bit Pain Location: low back Pain Descriptors / Indicators: Aching Pain Intervention(s): Monitored during session, Repositioned     Extremity/Trunk Assessment Upper Extremity Assessment Upper Extremity Assessment: Overall WFL for tasks assessed       Cervical / Trunk Assessment Cervical / Trunk Assessment: Other exceptions (back pain)   Communication Communication Communication: No apparent difficulties   Cognition Arousal: Alert Behavior During Therapy: Flat affect Cognition: No family/caregiver present to determine baseline  OT - Cognition Comments: history of memory deficits                 Following commands: Intact       Cueing  General Comments   Cueing Techniques: Verbal cues      Exercises     Shoulder Instructions      Home  Living Family/patient expects to be discharged to:: Assisted living                             Home Equipment: Rollator (4 wheels)   Additional Comments: [facility]      Prior Functioning/Environment Prior Level of Function : Needs assist             Mobility Comments: Ambulates with rollator, no assist ADLs Comments: States she is able to bath/dress herself; staff prepare meals.    OT Problem List:     OT Treatment/Interventions:        OT Goals(Current goals can be found in the care plan section)       OT Frequency:       Co-evaluation              AM-PAC OT 6 Clicks Daily Activity     Outcome Measure Help from another person eating meals?: None Help from another person taking care of personal grooming?: A Little Help from another person toileting, which includes using toliet, bedpan, or urinal?: A Little Help from another person bathing (including washing, rinsing, drying)?: A Little Help from another person to put on and taking off regular upper body clothing?: A Little Help from another person to put on and taking off regular lower body clothing?: A Little 6 Click Score: 19   End of Session Equipment Utilized During Treatment: Gait belt  Activity Tolerance: Patient tolerated treatment well Patient left: in bed;with call bell/phone within reach;with bed alarm set  OT Visit Diagnosis: Other symptoms and signs involving the nervous system (R29.898);Pain;Muscle weakness (generalized) (M62.81)                Time: 9077-9059 OT Time Calculation (min): 18 min Charges:  OT General Charges $OT Visit: 1 Visit OT Evaluation $OT Eval Low Complexity: 1 Low  Mliss HERO, OTR/L Acute Rehabilitation Services Office: [phone]   Kennth Mliss Helling 08/30/2024, 11:06 AM

## 2024-08-30 NOTE — TOC Initial Note (Addendum)
 Transition of Care [facility]) - Initial/Assessment Note    Patient Details  Name: Danielle [name] MRN: [id number] Date of Birth: 11/20/1950  Transition of Care [facility]) CM/SW Contact:    Doneta Glenys DASEN, RN Phone Number: 08/30/2024, 10:34 AM  Clinical Narrative:                 MOON completed. Presented for swollen tongue. PTA lives at [facility]; CM called [facility] left message to confirm patient stauses. DME-walker, denies HH, oxygen , or SDOH needs, Patient will need safe transport or [facility] [employer]. Inpatient care management will follow. 2:41 PM  CM confirmed assisted living([facility]) will need FL2 and discharge summary to return. Can call [phone] for possible assistance with transportation at discharge,   Barriers to Discharge: Continued Medical Work up   Patient Goals and CMS Choice Patient states their goals for this hospitalization and ongoing recovery are:: [facility] CMS Medicare.gov Compare Post Acute Care list provided to::  (NA) Choice offered to / list presented to : NA [facility] ownership interest in [facility].provided to:: Parent NA    Expected Discharge Plan and Services   Discharge Planning Services: NA Post Acute Care Choice: NA Living arrangements for the past 2 months: Assisted Living Facility                 DME Arranged: N/A DME Agency: NA       HH Arranged: NA HH Agency: NA        Prior Living Arrangements/Services Living arrangements for the past 2 months: Assisted Living Facility Lives with:: Facility Resident Patient language and need for interpreter reviewed:: Yes Do you feel safe going back to the place where you live?: Yes      Need for Family Participation in Patient Care: No (Comment) Care giver support system in place?: Yes (comment) Current home services: DME (walker) Criminal Activity/Legal Involvement Pertinent to Current  Situation/Hospitalization: No - Comment as needed  Activities of Daily Living   ADL Screening (condition at time of admission) Independently performs ADLs?: Yes (appropriate for developmental age) Is the patient deaf or have difficulty hearing?: No Does the patient have difficulty seeing, even when wearing glasses/contacts?: No Does the patient have difficulty concentrating, remembering, or making decisions?: No  Permission Sought/Granted Permission sought to share information with : Case Manager Permission granted to share information with : Yes, Verbal Permission Granted  Share Information with NAME: EVERET COUNTRYMAN (Daughter)  [phone]  Permission granted to share info w AGENCY: [facility]        Emotional Assessment Appearance:: Appears stated age Attitude/Demeanor/Rapport: Engaged, Gracious Affect (typically observed): Appropriate Orientation: : Oriented to Situation, Oriented to  Time, Oriented to Place, Oriented to Self Alcohol / Substance Use: Not Applicable Psych Involvement: No (comment)  Admission diagnosis:  Slurred speech [R47.81] Tongue swelling [R22.0] Patient Active Problem List   Diagnosis Date Noted   Tongue swelling 08/28/2024   Influenza A 12/26/2023   Acute hypoxemic respiratory failure (HCC) 08/07/2023   COPD exacerbation (HCC) 10/16/2022   COPD with acute exacerbation (HCC) 10/15/2022   Acute respiratory failure with hypoxia (HCC) 10/15/2022   GI bleeding 05/11/2022   Acute blood loss anemia 05/11/2022   Major depressive disorder, recurrent episode, moderate (HCC)    Dysarthria 12/29/2020   AKI (acute kidney injury) 12/29/2020   CAD (coronary artery disease) 03/22/2019   Unstable angina (HCC)    Hypokalemia 08/11/2018   Tobacco abuse 08/11/2018  Memory loss 01/30/2016   Schizophrenia, paranoid type (HCC) 04/28/2012   Cocaine abuse (HCC) 04/28/2012   Cannabis abuse 04/28/2012   Substance induced mood disorder (HCC)  04/28/2012   Noncompliance 09/16/2011   Anxiety 09/16/2011   Constipation 09/16/2011   Polysubstance abuse (HCC) 12/03/2008   Hyperlipidemia 08/01/2008   MIGRAINE HEADACHE 08/01/2008   COPD (chronic obstructive pulmonary disease) (HCC) 08/01/2008   DEPRESSION 06/20/2008   ANXIETY DISORDER, GENERALIZED 05/28/2008   HYPERTENSION, BENIGN ESSENTIAL 05/28/2008   GERD 05/28/2008   DEEP VENOUS THROMBOPHLEBITIS, LEFT, LEG, HX OF 10/18/1988   PCP:  [facility]:   [facility] [place], [place] - [address] [place] [address] Phone: [phone] Fax: [phone]     Social Drivers of Health (SDOH) Social History: SDOH Screenings   Food Insecurity: No Food Insecurity (08/29/2024)  Housing: Low Risk  (08/29/2024)  Transportation Needs: No Transportation Needs (08/29/2024)  Utilities: Not At Risk (08/29/2024)  Depression (PHQ2-9): Low Risk  (06/18/2019)  Social Connections: Moderately Isolated (08/29/2024)  Tobacco Use: High Risk (08/28/2024)   SDOH Interventions:     Readmission Risk Interventions    08/15/2023   10:26 AM 10/19/2022   10:48 AM 10/18/2022   12:54 PM  Readmission Risk Prevention Plan  Transportation Screening Complete Complete Complete  PCP or Specialist Appt within 5-7 Days Complete    PCP or Specialist Appt within 3-5 Days  Complete Complete  Home Care Screening Complete    Medication Review (RN CM) Complete    HRI or Home Care Consult  Complete Complete  Social Work Consult for Recovery Care Planning/Counseling  Complete Complete  Palliative Care Screening  Complete Complete  Medication Review Oceanographer)  Complete Complete

## 2024-08-30 NOTE — Plan of Care (Signed)
  Problem: Education: Goal: Knowledge of General Education information will improve Description: Including pain rating scale, medication(s)/side effects and non-pharmacologic comfort measures Outcome: Progressing   Problem: Clinical Measurements: Goal: Respiratory complications will improve Outcome: Progressing   Problem: Activity: Goal: Risk for activity intolerance will decrease Outcome: Progressing   Problem: Nutrition: Goal: Adequate nutrition will be maintained Outcome: Progressing   Problem: Elimination: Goal: Will not experience complications related to bowel motility Outcome: Progressing   Problem: Pain Managment: Goal: General experience of comfort will improve and/or be controlled Outcome: Progressing   Problem: Safety: Goal: Ability to remain free from injury will improve Outcome: Progressing

## 2024-08-30 NOTE — Progress Notes (Signed)
 Mobility Specialist Progress Note:   08/30/24 1401  Mobility  Activity Ambulated with assistance  Level of Assistance Contact guard assist, steadying assist  Assistive Device  (Handheld Assist)  Distance Ambulated (ft) 100 ft  Activity Response Tolerated well  Mobility Referral Yes  Mobility visit 1 Mobility  Mobility Specialist Start Time (ACUTE ONLY) 1341  Mobility Specialist Stop Time (ACUTE ONLY) 1351  Mobility Specialist Time Calculation (min) (ACUTE ONLY) 10 min   Pt was received in bed and agreed to mobility. No complaints during ambulation. Returned to bed with all needs met. Call bell in reach and bed alarm on.  [employer] - Mobility Specialist

## 2024-08-31 DIAGNOSIS — R220 Localized swelling, mass and lump, head: Secondary | ICD-10-CM | POA: Diagnosis not present

## 2024-08-31 LAB — C4 COMPLEMENT: Complement C4, Body Fluid: 47 mg/dL — ABNORMAL HIGH (ref 12–38)

## 2024-08-31 NOTE — Plan of Care (Signed)

## 2024-08-31 NOTE — Progress Notes (Signed)
 PROGRESS NOTE    Danielle [name]  FMW:[id number] DOB: 02-20-1951 DOA: 08/28/2024 PCP: [facility]   Brief Narrative:  HPI: Danielle [name] is a 73 y.o. female with medical history significant for hypertension, COPD, CAD, memory loss, schizoaffective disorder, depression, anxiety, and tardive dyskinesia who presents with lethargy and difficulty speaking.   Patient was noted to be lethargic at her facility today and was having difficulty speaking.  EMS was called, SBP was said to be in the 70s initially but improved to the upper 90s, and patient was brought into the ED as a code stroke.   Patient feels that her tongue is swollen but denies any difficulty breathing or swallowing, denies any rash or itching, and denies any abdominal pain, nausea, vomiting, chest pain, or lightheadedness.  She does not believe she has been on any new medications recently.   ED Course: Upon arrival to the ED, patient is found to be afebrile and saturating well on room air with normal HR and stable BP.  Labs are most notable for creatinine 1.69, normal CBC, and normal lactic acid.  There are no acute findings on head CT.   Patient was evaluated by neurology who had low suspicion for stroke.  Blood cultures were collected and the patient was given a liter of NS, IV Solu-Medrol , and IV Benadryl .  BP and clinical status have remained stable.  On 08/31/2024 the patient is feeling better, except that she has a headache. The patient's PTA medication list has been discussed with pharmacy. They note that the patient is on several medications that could have caused her angioedema. They do not recommend changing psychiatric medications without psychiatric input. Will attempt to discuss with psychiatry prior to discharge.  Assessment & Plan:   Principal Problem:   Tongue swelling Active Problems:   ANXIETY DISORDER, GENERALIZED   HYPERTENSION, BENIGN ESSENTIAL   COPD (chronic obstructive pulmonary disease)  (HCC)   Memory loss   CAD (coronary artery disease)   AKI (acute kidney injury)   Major depressive disorder, recurrent episode, moderate (HCC)   1. Tongue swelling  - BP is stable and there is no respiratory or swallowing difficulty, no urticaria/pruritus, no ACE or NSAID use, and GI sxs and no recent new medication. - Checking C4, C1 esterase inhibitor, and C1q, continue close monitoring of airway.  Will continue Solu-Medrol  decrease the dose to 20 mg and placed on famotidine .   - Resolved.  - The patient's PTA medication list has been discussed with pharmacy. They note that the patient is on several medications that could have caused her angioedema. They do not recommend changing psychiatric medications without psychiatric input. Will attempt to discuss with psychiatry prior to discharge.   2. AKI  - -Resolved with IV fluids    3. Schizoaffective disorder; depression; anxiety; tardive dyskinesia continue Ingrezza , Zoloft , Buspar and resume Seroquel .   4. COPD  - Not in exacerbation   - Continue ICS-LAMA-LABA and as-needed albuterol      5. Hypertension  - BP low-normal in ED and antihypertensives held on admission, blood pressure still low.   6. HLD  - Continue Lipitor    7. Memory loss  - Delirium precautions   8.  Generalized weakness: Patient appears to be very weak and deconditioned.  Might need SNF.  Consult PT OT.  DVT prophylaxis: SCDs Start: 08/28/24 2330   Code Status: Full Code  Family Communication:  None present at bedside.  Plan of care discussed with patient in length and he/she verbalized  understanding and agreed with it.  Status is: Inpatient The patient will require care spanning > 2 midnights and should be moved to inpatient because: Patient is still very weak.  Plan is for discharge of patient to SNF.   Estimated body mass index is 28.87 kg/m as calculated from the following:   Height as of this encounter: 5' 1 (1.549 m).   Weight as of this  encounter: 69.3 kg.    Nutritional Assessment: Body mass index is 28.87 kg/m.[name] Seen by dietician.  I agree with the assessment and plan as outlined below: Nutrition Status:        . Skin Assessment: I have examined the patient's skin and I agree with the wound assessment as performed by the wound care RN as outlined below:    Consultants:  None  Procedures:  None  Antimicrobials:  Anti-infectives (From admission, onward)    None         Subjective: Seen and examined at bedside, patient states her tongue swelling is better but not back to baseline.  His speech is better but not back to baseline. She still sounds garbles  Objective: Vitals:   08/30/24 1947 08/31/24 0441 08/31/24 0838 08/31/24 1132  BP:  (!) 152/93  (!) 140/95  Pulse:  96  (!) 102  Resp:  16  18  Temp:  98 F (36.7 C)  97.7 F (36.5 C)  TempSrc:      SpO2: 98% 100% 99%   Weight:      Height:        Intake/Output Summary (Last 24 hours) at 08/31/2024 1834 Last data filed at 08/31/2024 1023 Gross per 24 hour  Intake 3 ml  Output --  Net 3 ml   Filed Weights   08/28/24 1921 08/29/24 1523  Weight: 69.6 kg 69.3 kg    Examination:  Exam:  Constitutional:  The patient is awake, alert, and oriented x 3. No acute distress. Eyes:  pupils and irises appear normal Normal lids and conjunctivae ENMT:  grossly normal hearing  Lips appear normal external ears, nose appear normal Oropharynx: mucosa, tongue,posterior pharynx appear normal Neck:  neck appears normal, no masses, normal ROM, supple no thyromegaly Respiratory:  No increased work of breathing. No wheezes, rales, or rhonchi No tactile fremitus Cardiovascular:  Regular rate and rhythm No murmurs, ectopy, or gallups. No lateral PMI. No thrills. Abdomen:  Abdomen is soft, non-tender, non-distended No hernias, masses, or organomegaly Normoactive bowel sounds.  Musculoskeletal:  No cyanosis, clubbing, or edema Skin:  No  rashes, lesions, ulcers palpation of skin: no induration or nodules Neurologic:  CN 2-12 intact Sensation all 4 extremities intact Psychiatric:  Mental status Mood, affect appropriate Orientation to person, place, time  judgment and insight appear intact   Data Reviewed: I have personally reviewed following labs and imaging studies  CBC: Recent Labs  Lab 08/28/24 2010 08/28/24 2013 08/29/24 0632  WBC 6.5  --  4.4  NEUTROABS 2.7  --   --   HGB 12.9 13.3  13.6 12.7  HCT 38.8 39.0  40.0 38.7  MCV 91.3  --  91.5  PLT 242  --  250   Basic Metabolic Panel: Recent Labs  Lab 08/28/24 2010 08/28/24 2013 08/29/24 0632 08/30/24 0436  NA 140 141  140 141 142  K 3.6 3.5  3.5 3.9 4.0  CL 103 105 108 110  CO2 22  --  20* 21*  GLUCOSE 123* 132* 143* 111*  BUN 30* 31* 25*  14  CREATININE 1.69* 1.80* 1.14* 0.74  CALCIUM  9.6  --  9.2 9.9   GFR: Estimated Creatinine Clearance: 57.4 mL/min (by C-G formula based on SCr of 0.74 mg/dL). Liver Function Tests: Recent Labs  Lab 08/28/24 2010  AST 17  ALT 12  ALKPHOS 60  BILITOT 0.5  PROT 6.2*  ALBUMIN 3.7   No results for input(s): LIPASE, AMYLASE in the last 168 hours. No results for input(s): AMMONIA in the last 168 hours. Coagulation Profile: Recent Labs  Lab 08/28/24 2010  INR 1.0   Cardiac Enzymes: No results for input(s): CKTOTAL, CKMB, CKMBINDEX, TROPONINI in the last 168 hours. BNP (last 3 results) No results for input(s): PROBNP in the last 8760 hours. HbA1C: No results for input(s): HGBA1C in the last 72 hours. CBG: Recent Labs  Lab 08/28/24 1957  GLUCAP 130*   Lipid Profile: No results for input(s): CHOL, HDL, LDLCALC, TRIG, CHOLHDL, LDLDIRECT in the last 72 hours. Thyroid  Function Tests: No results for input(s): TSH, T4TOTAL, FREET4, T3FREE, THYROIDAB in the last 72 hours. Anemia Panel: No results for input(s): VITAMINB12, FOLATE, FERRITIN, TIBC,  IRON, RETICCTPCT in the last 72 hours. Sepsis Labs: Recent Labs  Lab 08/28/24 2014  LATICACIDVEN 1.9    Recent Results (from the past 240 hours)  Blood Culture (routine x 2)     Status: None (Preliminary result)   Collection Time: 08/28/24  7:30 PM   Specimen: BLOOD  Result Value Ref Range Status   Specimen Description BLOOD LEFT ANTECUBITAL  Final   Special Requests   Final    BOTTLES DRAWN AEROBIC AND ANAEROBIC Blood Culture results may not be optimal due to an inadequate volume of blood received in culture bottles   Culture   Final    NO GROWTH 3 DAYS Performed at [facility], [address], [place], [place] [address]    Report Status PENDING  Incomplete  Blood Culture (routine x 2)     Status: None (Preliminary result)   Collection Time: 08/28/24  8:00 PM   Specimen: BLOOD  Result Value Ref Range Status   Specimen Description BLOOD LEFT ANTECUBITAL  Final   Special Requests   Final    BOTTLES DRAWN AEROBIC AND ANAEROBIC Blood Culture results may not be optimal due to an inadequate volume of blood received in culture bottles   Culture   Final    NO GROWTH 3 DAYS Performed at [facility], [address], [place], [place] [address]    Report Status PENDING  Incomplete     Radiology Studies: No results found.   Scheduled Meds:  atorvastatin   10 mg Oral Daily   budesonide -glycopyrrolate-formoterol   2 puff Inhalation BID   busPIRone  15 mg Oral TID   famotidine   20 mg Oral Daily   latanoprost   1 drop Both Eyes Daily   methylPREDNISolone  (SOLU-MEDROL ) injection  40 mg Intravenous Daily   nicotine   21 mg Transdermal Daily   pregabalin   75 mg Oral TID   QUEtiapine   200 mg Oral QHS   sertraline   100 mg Oral Daily   sodium chloride  flush  3 mL Intravenous Q12H   valbenazine   80 mg Oral Daily  I have reviewed pertinent nursing notes, vitals, labs, and images as necessary. I have ordered labwork to follow up on as indicated.  I have reviewed the last  notes from staff over past 24 hours. I have discussed patient's care plan and test results with nursing staff, CM/SW, and  other staff as appropriate.  Old records reviewed in assessment of this patient  Time spent: Greater than 50% of the 55 minute visit was spent in counseling/coordination of care for the patient as laid out in the A&P.     LOS: 1 day   Brigida Bureau, MD [facility]  08/31/2024, 6:34 PM

## 2024-08-31 NOTE — Plan of Care (Signed)

## 2024-08-31 NOTE — TOC Progression Note (Signed)
 Transition of Care [facility]) - Progression Note    Patient Details  Name: Carreen Milius MRN: [id number] Date of Birth: 05-Jun-1951  Transition of Care [facility]) CM/SW Contact  Doneta Glenys DASEN, RN Phone Number: 08/31/2024, 4:20 PM  Clinical Narrative:    CM started FL2 for return to  [facility]. Send FL2 and Discharge Summary in the HUB and with patient  at discharge.     Barriers to Discharge: Continued Medical Work up               Expected Discharge Plan and Services   Discharge Planning Services: NA Post Acute Care Choice: NA Living arrangements for the past 2 months: Assisted Living Facility                 DME Arranged: N/A DME Agency: NA       HH Arranged: NA HH Agency: NA         Social Drivers of Health (SDOH) Interventions SDOH Screenings   Food Insecurity: No Food Insecurity (08/29/2024)  Housing: Low Risk  (08/29/2024)  Transportation Needs: No Transportation Needs (08/29/2024)  Utilities: Not At Risk (08/29/2024)  Depression (PHQ2-9): Low Risk  (06/18/2019)  Social Connections: Moderately Isolated (08/29/2024)  Tobacco Use: High Risk (08/28/2024)    Readmission Risk Interventions    08/15/2023   10:26 AM 10/19/2022   10:48 AM 10/18/2022   12:54 PM  Readmission Risk Prevention Plan  Transportation Screening Complete Complete Complete  PCP or Specialist Appt within 5-7 Days Complete    PCP or Specialist Appt within 3-5 Days  Complete Complete  Home Care Screening Complete    Medication Review (RN CM) Complete    HRI or Home Care Consult  Complete Complete  Social Work Consult for Recovery Care Planning/Counseling  Complete Complete  Palliative Care Screening  Complete Complete  Medication Review Oceanographer)  Complete Complete

## 2024-09-01 DIAGNOSIS — R220 Localized swelling, mass and lump, head: Secondary | ICD-10-CM | POA: Diagnosis not present

## 2024-09-01 MED ORDER — EPINEPHRINE 0.3 MG/0.3ML IJ SOAJ: 0.3000 mg | INTRAMUSCULAR | 0 refills | Status: AC | PRN

## 2024-09-01 NOTE — TOC Transition Note (Signed)
 Transition of Care [facility]) - Discharge Note   Patient Details  Name: Danielle [name] MRN: [id number] Date of Birth: [birth year]  Transition of Care [facility]) CM/SW Contact:  Heather DELENA Saltness, LCSW Phone Number: 09/01/2024, 11:36 AM   Clinical Narrative:    Pt discharging back to [facility] today. Pt returning to room 403. D/C packet placed in pt's chart at RN station. RN to call report to [phone]. Pt's daughter, [name], to pick pt up at 1 PM for transport. Pt and daughter made aware and in agreement with discharge plan. No further TOC needs at this time.   Final next level of care: Assisted Living Barriers to Discharge: Barriers Resolved   Patient Goals and CMS Choice Patient states their goals for this hospitalization and ongoing recovery are:: To return to [employer]gov Compare Post Acute Care list provided to::  (NA) Choice offered to / list presented to : NA [place] ownership interest in [facility].provided to:: Parent NA    Discharge Placement  [facility]            Patient to be transferred to facility by: [facility] Name of family member notified: [name] Patient and family notified of of transfer: 09/01/24  Discharge Plan and Services Additional resources added to the After Visit Summary for  Follow Up   Discharge Planning Services: NA Post Acute Care Choice: NA          DME Arranged: N/A DME Agency: NA       HH Arranged: NA HH Agency: NA        Social Drivers of Health (SDOH) Interventions SDOH Screenings   Food Insecurity: No Food Insecurity (08/29/2024)  Housing: Low Risk  (08/29/2024)  Transportation Needs: No Transportation Needs (08/29/2024)  Utilities: Not At Risk (08/29/2024)  Depression (PHQ2-9): Low Risk  (06/18/2019)  Social Connections: Moderately Isolated (08/29/2024)  Tobacco Use: High Risk (08/28/2024)     Readmission Risk Interventions    08/15/2023   10:26 AM  10/19/2022   10:48 AM 10/18/2022   12:54 PM  Readmission Risk Prevention Plan  Transportation Screening Complete Complete Complete  PCP or Specialist Appt within 5-7 Days Complete    PCP or Specialist Appt within 3-5 Days  Complete Complete  Home Care Screening Complete    Medication Review (RN CM) Complete    HRI or Home Care Consult  Complete Complete  Social Work Consult for Recovery Care Planning/Counseling  Complete Complete  Palliative Care Screening  Complete Complete  Medication Review Oceanographer)  Complete Complete    Signed: Heather Saltness, MSW, LCSW Clinical Social Worker Inpatient Care Management 09/01/2024 11:48 AM

## 2024-09-01 NOTE — Discharge Summary (Signed)
 Physician Discharge Summary   Patient: Danielle [name] MRN: [id number] DOB: October 25, 1950  Admit date:     08/28/2024  Discharge date:   Discharge Physician: Brigida Bureau   PCP: [facility]   Recommendations at discharge:    Discharge to ALF Follow up with PCP in 7-10 days. Follow up with psychiatrist or practitioner prescribing psych meds ASAP to review her medications.  Discharge Diagnoses: Principal Problem:   Tongue swelling Active Problems:   ANXIETY DISORDER, GENERALIZED   HYPERTENSION, BENIGN ESSENTIAL   COPD (chronic obstructive pulmonary disease) (HCC)   Memory loss   CAD (coronary artery disease)   AKI (acute kidney injury)   Major depressive disorder, recurrent episode, moderate (HCC)  Resolved Problems:   * No resolved hospital problems. *  Hospital Course:  Danielle [name] is a 73 y.o. female with medical history significant for hypertension, COPD, CAD, memory loss, schizoaffective disorder, depression, anxiety, and tardive dyskinesia who presents with lethargy and difficulty speaking.   Patient was noted to be lethargic at her facility today and was having difficulty speaking.  EMS was called, SBP was said to be in the 70s initially but improved to the upper 90s, and patient was brought into the ED as a code stroke.   Patient feels that her tongue is swollen but denies any difficulty breathing or swallowing, denies any rash or itching, and denies any abdominal pain, nausea, vomiting, chest pain, or lightheadedness.  She does not believe she has been on any new medications recently.   ED Course: Upon arrival to the ED, patient is found to be afebrile and saturating well on room air with normal HR and stable BP.  Labs are most notable for creatinine 1.69, normal CBC, and normal lactic acid.  There are no acute findings on head CT.   Patient was evaluated by neurology who had low suspicion for stroke.  Blood cultures were collected and the patient was  given a liter of NS, IV Solu-Medrol , and IV Benadryl .  BP and clinical status have remained stable.   On 08/31/2024 the patient is feeling better, except that she has a headache. The patient's PTA medication list has been discussed with pharmacy. They note that the patient is on several medications that could have caused her angioedema. They do not recommend changing psychiatric medications without input from the patient's psychiatrist..   On 09/01/2024 the patient is resting comfortably. Her headache is resolved. She has been back on her home psychiatric medications without recurrence of symptoms.  She will be discharged back to her ALF today in fair condition. She will need to follow up with her psychiatrist for review of her medications.   Assessment and Plan:  Principal Problem:   Tongue swelling Active Problems:   ANXIETY DISORDER, GENERALIZED   HYPERTENSION, BENIGN ESSENTIAL   COPD (chronic obstructive pulmonary disease) (HCC)   Memory loss   CAD (coronary artery disease)   AKI (acute kidney injury)   Major depressive disorder, recurrent episode, moderate (HCC)     1. Tongue swelling  - BP is stable and there is no respiratory or swallowing difficulty, no urticaria/pruritus, no ACE or NSAID use, and GI sxs and no recent new medication. - Checking C4, C1 esterase inhibitor, and C1q, continue close monitoring of airway.  Will continue Solu-Medrol  decrease the dose to 20 mg and placed on famotidine .   - Resolved.  - The patient's PTA medication list has been discussed with pharmacy. They note that the patient is on several medications  that could have caused her angioedema. They do not recommend changing psychiatric medications without psychiatric input. The patient will need to follow up with her psychiatrist at earliest possible appointment. - It is not known what caused this problem. It may have been one of her medications, or possibly even a food. Will discharge with an epi-pen for  possible recurrence.  2. AKI  - -Resolved with IV fluids    3. Schizoaffective disorder; depression; anxiety; tardive dyskinesia continue Ingrezza , Zoloft , Buspar and resume Seroquel . The patient has not had recurrence of symptoms.    4. COPD  - Not in exacerbation   - Continue ICS-LAMA-LABA and as-needed albuterol      5. Hypertension  - BP low-normal in ED and antihypertensives held on admission, blood pressure still low.   6. HLD  - Continue Lipitor    7. Memory loss  - Delirium precautions    8.  Generalized weakness: Patient appears to be very weak and deconditioned.  Might need SNF.  Consult PT OT.   DVT prophylaxis: SCDs Start: 08/28/24 2330   Code Status: Full Code    Consultants: None Procedures performed: None  Disposition: Assisted living Diet recommendation:  Cardiac diet DISCHARGE MEDICATION: Allergies as of 09/01/2024       Reactions   Clonazepam Swelling, Other (See Comments)   Pt was recently given a triamterene  hydrochlorothiazide  combination as well as clonazepam and had an allergic reaction to one of them. Caused swelling of face and eyes   Doxycycline  Swelling, Other (See Comments)   Swelling of face and eyes   Fosinopril Sodium Swelling, Other (See Comments)   Swelling face,lips-angioedema from ACE I   Hydrochlorothiazide -triamterene  Swelling   Pt was recently given a triamterene  hydrochlorothiazide  combination as well as clonazepam and had an allergic reaction to one of them. Swelling of face and eyes   Penicillins Swelling, Other (See Comments)   Face and eyes Has patient had a PCN reaction causing immediate rash, facial/tongue/throat swelling, SOB or lightheadedness with hypotension: Yes Has patient had a PCN reaction causing severe rash involving mucus membranes or skin necrosis: No Has patient had a PCN reaction that required hospitalization: No Has patient had a PCN reaction occurring within the last 10 years: No If all of the above  answers are NO, then may proceed with Cephalosporin use.   Sulfa Antibiotics Other (See Comments)   Allergic, per MAR   Triamterene  Other (See Comments)   Pt was recently given a triamterene  hydrochlorothiazide  combination as well as clonazepam and had an allergic reaction to one of them. Allergic, per [facility]        Medication List     TAKE these medications    albuterol  (2.5 MG/3ML) 0.083% nebulizer solution Commonly known as: PROVENTIL  Take 2.5 mg by nebulization every 6 (six) hours as needed for shortness of breath.   albuterol  108 (90 Base) MCG/ACT inhaler Commonly known as: VENTOLIN  HFA Inhale 1-2 puffs into the lungs every 4 (four) hours as needed for wheezing or shortness of breath (or cough).   amLODipine  5 MG tablet Commonly known as: NORVASC  Take 5 mg by mouth daily.   atorvastatin  10 MG tablet Commonly known as: LIPITOR Take 10 mg by mouth daily.   busPIRone 15 MG tablet Commonly known as: BUSPAR Take 15 mg by mouth 3 (three) times daily.   DHEA 25 MG Caps Take 25 mg by mouth in the morning.   EPINEPHrine  0.3 mg/0.3 mL Soaj injection Commonly known as: EPI-PEN Inject 0.3 mg  into the muscle as needed for anaphylaxis.   HYDROcodone -acetaminophen  10-325 MG tablet Commonly known as: NORCO Take 1 tablet by mouth See admin instructions. Give 1 tablet by mouth 5 time a day as needed   hydrOXYzine  25 MG capsule Commonly known as: VISTARIL  Take 25 mg by mouth 2 (two) times daily as needed for anxiety.   Ingrezza  80 MG capsule Generic drug: valbenazine  Take 1 capsule (80 mg total) by mouth daily.   latanoprost  0.005 % ophthalmic solution Commonly known as: XALATAN  Place 1 drop into both eyes daily.   loratadine  10 MG tablet Commonly known as: CLARITIN  Take 10 mg by mouth daily.   metoprolol  tartrate 25 MG tablet Commonly known as: LOPRESSOR  Take 1 tablet (25 mg total) by mouth 2 (two) times daily.   nicotine  21 mg/24hr patch Commonly known as:  NICODERM CQ  - dosed in mg/24 hours Place 21 mg onto the skin daily.   pregabalin  75 MG capsule Commonly known as: LYRICA  Take 1 capsule (75 mg total) by mouth 3 (three) times daily.   QUEtiapine  200 MG tablet Commonly known as: SEROQUEL  Take 200 mg by mouth at bedtime.   sertraline  100 MG tablet Commonly known as: ZOLOFT  Take 100 mg by mouth daily.   Trelegy Ellipta 200-62.5-25 MCG/ACT Aepb Generic drug: Fluticasone-Umeclidin-Vilant Inhale 1 puff into the lungs daily.   triamcinolone cream 0.1 % Commonly known as: KENALOG Apply 1 g topically See admin instructions. Apply (1 grams) green pea-sized bead to popliteal fossae twice a day   Vitamin D3 250 MCG (10000 UT) Tabs Take 10,000 Units by mouth daily.        Discharge Exam: Danielle [name]   08/28/24 1921 08/29/24 1523  Weight: 69.6 kg 69.3 kg   Exam:  Constitutional:  The patient is awake, alert, and oriented x 3. No acute distress. Eyes:  pupils and irises appear normal Normal lids and conjunctivae ENMT:  grossly normal hearing  Lips appear normal external ears, nose appear normal Oropharynx: mucosa, tongue,posterior pharynx appear normal Neck:  neck appears normal, no masses, normal ROM, supple no thyromegaly Respiratory:  No increased work of breathing. No wheezes, rales, or rhonchi No tactile fremitus Cardiovascular:  Regular rate and rhythm No murmurs, ectopy, or gallups. No lateral PMI. No thrills. Abdomen:  Abdomen is soft, non-tender, non-distended No hernias, masses, or organomegaly Normoactive bowel sounds.  Musculoskeletal:  No cyanosis, clubbing, or edema Skin:  No rashes, lesions, ulcers palpation of skin: no induration or nodules Neurologic:  CN 2-12 intact Sensation all 4 extremities intact Psychiatric:  Mental status Mood, affect appropriate Orientation to person, place, time  judgment and insight appear intact   Condition at discharge: fair  The results of significant  diagnostics from this hospitalization (including imaging, microbiology, ancillary and laboratory) are listed below for reference.   Imaging Studies: DG Chest Port 1 View Result Date: 08/28/2024 EXAM: 1 VIEW(S) XRAY OF THE CHEST 08/28/2024 07:59:22 PM COMPARISON: 12/25/2023 CLINICAL HISTORY: Questionable sepsis - evaluate for abnormality. FINDINGS: LUNGS AND PLEURA: Mild patchy bibasilar opacities likely atelectasis. No pulmonary edema. No pleural effusion. No pneumothorax. HEART AND MEDIASTINUM: No acute abnormality of the cardiac and mediastinal silhouettes. BONES AND SOFT TISSUES: No acute osseous abnormality. IMPRESSION: 1. Mild patchy bibasilar opacities, likely atelectasis. Electronically signed by: Pinkie Pebbles MD 08/28/2024 08:04 PM EST RP Workstation: [id number]   CT HEAD CODE STROKE WO CONTRAST` Result Date: 08/28/2024 CLINICAL DATA:  Code stroke. Initial evaluation for acute neuro deficit, stroke suspected. EXAM: CT HEAD WITHOUT CONTRAST  TECHNIQUE: Contiguous axial images were obtained from the base of the skull through the vertex without intravenous contrast. RADIATION DOSE REDUCTION: This exam was performed according to the departmental dose-optimization program which includes automated exposure control, adjustment of the mA and/or kV according to patient size and/or use of iterative reconstruction technique. COMPARISON:  Comparison made with prior MRI from 08/30/2019. FINDINGS: Brain: Age-related cerebral atrophy with moderate chronic microvascular ischemic disease. Chronic right PCA distribution infarct involving the right occipital lobe noted. No acute intracranial hemorrhage. No acute large vessel territory infarct. No mass lesion or midline shift. No hydrocephalus or extra-axial fluid collection. Vascular: No abnormal hyperdense vessel. Calcified atherosclerosis present at the skull base. Skull: Scalp soft tissues demonstrate no acute finding. Calvarium intact. Sinuses/Orbits: Globes  and orbital soft tissues within normal limits. Paranasal sinuses are largely clear. No significant mastoid effusion. Other: None. ASPECTS [facility] Stroke Program Early CT Score) - Ganglionic level infarction (caudate, lentiform nuclei, internal capsule, insula, M1-M3 cortex): 7 - Supraganglionic infarction (M4-M6 cortex): 3 Total score (0-10 with 10 being normal): 10 IMPRESSION: 1. No acute intracranial abnormality. 2. ASPECTS is 10. 3. Moderate chronic microvascular ischemic disease with underlying chronic right PCA distribution infarct. These results were communicated to Dr. Vanessa at 7:22 pm on 08/28/2024 by text page via the [facility] messaging system. Electronically Signed   By: Morene Hoard M.D.   On: 08/28/2024 19:36    Microbiology: Results for orders placed or performed during the hospital encounter of 08/28/24  Blood Culture (routine x 2)     Status: None (Preliminary result)   Collection Time: 08/28/24  7:30 PM   Specimen: BLOOD  Result Value Ref Range Status   Specimen Description BLOOD LEFT ANTECUBITAL  Final   Special Requests   Final    BOTTLES DRAWN AEROBIC AND ANAEROBIC Blood Culture results may not be optimal due to an inadequate volume of blood received in culture bottles   Culture   Final    NO GROWTH 4 DAYS Performed at [facility], [address], [place], [place] [address]    Report Status PENDING  Incomplete  Blood Culture (routine x 2)     Status: None (Preliminary result)   Collection Time: 08/28/24  8:00 PM   Specimen: BLOOD  Result Value Ref Range Status   Specimen Description BLOOD LEFT ANTECUBITAL  Final   Special Requests   Final    BOTTLES DRAWN AEROBIC AND ANAEROBIC Blood Culture results may not be optimal due to an inadequate volume of blood received in culture bottles   Culture   Final    NO GROWTH 4 DAYS Performed at [facility], [address], [place], [place] [address]    Report Status PENDING  Incomplete     Labs: CBC: Recent Labs  Lab 08/28/24 2010 08/28/24 2013 08/29/24 0632  WBC 6.5  --  4.4  NEUTROABS 2.7  --   --   HGB 12.9 13.3  13.6 12.7  HCT 38.8 39.0  40.0 38.7  MCV 91.3  --  91.5  PLT 242  --  250   Basic Metabolic Panel: Recent Labs  Lab 08/28/24 2010 08/28/24 2013 08/29/24 0632 08/30/24 0436  NA 140 141  140 141 142  K 3.6 3.5  3.5 3.9 4.0  CL 103 105 108 110  CO2 22  --  20* 21*  GLUCOSE 123* 132* 143* 111*  BUN 30* 31* 25* 14  CREATININE 1.69* 1.80* 1.14* 0.74  CALCIUM  9.6  --  9.2 9.9   Liver Function Tests: Recent Labs  Lab 08/28/24 2010  AST 17  ALT 12  ALKPHOS 60  BILITOT 0.5  PROT 6.2*  ALBUMIN 3.7   CBG: Recent Labs  Lab 08/28/24 1957  GLUCAP 130*    Discharge time spent: greater than 30 minutes.  Signed: Savhanna Sliva, DO [facility] 09/01/2024

## 2024-09-01 NOTE — [place] FL2 (Signed)
 [place]  MEDICAID [id number] LEVEL OF CARE FORM     IDENTIFICATION  Patient Name: Danielle [name] Birthdate: [birth year] Sex: female Admission Date (Current Location): 08/28/2024  [facility] and [place] Number:  Producer, Television/film/video and Address:  [facility],  [address] [place], [place] [address]      Provider Number: [id number]  Attending Physician Name and Address:  Soledad Ligas, DO  Relative Name and Phone Number:  EVERET COUNTRYMAN (Daughter)  [phone] [facility])    Current Level of Care: Hospital Recommended Level of Care: Assisted Living Facility Prior Approval Number:    Date Approved/Denied:   PASRR Number:    Discharge Plan: Other (Comment) (Assisted Living)    Current Diagnoses: Patient Active Problem List   Diagnosis Date Noted   Tongue swelling 08/28/2024   Influenza A 12/26/2023   Acute hypoxemic respiratory failure (HCC) 08/07/2023   COPD exacerbation (HCC) 10/16/2022   COPD with acute exacerbation (HCC) 10/15/2022   Acute respiratory failure with hypoxia (HCC) 10/15/2022   GI bleeding 05/11/2022   Acute blood loss anemia 05/11/2022   Major depressive disorder, recurrent episode, moderate (HCC)    Dysarthria 12/29/2020   AKI (acute kidney injury) 12/29/2020   CAD (coronary artery disease) 03/22/2019   Unstable angina (HCC)    Hypokalemia 08/11/2018   Tobacco abuse 08/11/2018   Memory loss 01/30/2016   Schizophrenia, paranoid type (HCC) 04/28/2012   Cocaine abuse (HCC) 04/28/2012   Cannabis abuse 04/28/2012   Substance induced mood disorder (HCC) 04/28/2012   Noncompliance 09/16/2011   Anxiety 09/16/2011   Constipation 09/16/2011   Polysubstance abuse (HCC) 12/03/2008   Hyperlipidemia 08/01/2008   MIGRAINE HEADACHE 08/01/2008   COPD (chronic obstructive pulmonary disease) (HCC) 08/01/2008   DEPRESSION 06/20/2008   ANXIETY DISORDER, GENERALIZED 05/28/2008   HYPERTENSION, BENIGN ESSENTIAL 05/28/2008   GERD 05/28/2008   DEEP VENOUS  THROMBOPHLEBITIS, LEFT, LEG, HX OF 10/18/1988    Orientation RESPIRATION BLADDER Height & Weight     Self, Time, Situation, Place  Normal Continent Weight: 152 lb 12.5 oz (69.3 kg) Height:  5' 1 (154.9 cm)  BEHAVIORAL SYMPTOMS/MOOD NEUROLOGICAL BOWEL NUTRITION STATUS      Continent Diet  AMBULATORY STATUS COMMUNICATION OF NEEDS Skin   Independent Verbally Normal                       Personal Care Assistance Level of Assistance              Functional Limitations Info  Speech     Speech Info: Impaired    SPECIAL CARE FACTORS FREQUENCY                       Contractures Contractures Info: Not present    Additional Factors Info  Code Status, Allergies, Psychotropic Code Status Info: FULL Allergies Info: Clonazepam, Doxycycline , Fosinopril Sodium, Hydrochlorothiazide -triamterene , Penicillins, Sulfa Antibiotics, Triamterene  Psychotropic Info: Quetiapine , Sertraline          Current Medications (09/01/2024):  This is the current hospital active medication list Current Facility-Administered Medications  Medication Dose Route Frequency Provider Last Rate Last Admin   acetaminophen  (TYLENOL ) tablet 650 mg  650 mg Oral Q6H PRN Opyd, Timothy S, MD   650 mg at 08/30/24 1011   Or   acetaminophen  (TYLENOL ) suppository 650 mg  650 mg Rectal Q6H PRN Opyd, Timothy S, MD       albuterol  (PROVENTIL ) (2.5 MG/3ML) 0.083% nebulizer solution 2.5 mg  2.5 mg Nebulization Q6H PRN Opyd, Evalene  S, MD   2.5 mg at 08/30/24 0035   atorvastatin  (LIPITOR) tablet 10 mg  10 mg Oral Daily Opyd, Timothy S, MD   10 mg at 09/01/24 1011   budesonide -glycopyrrolate-formoterol  (BREZTRI) 160-9-4.8 MCG/ACT inhaler 2 puff  2 puff Inhalation BID Opyd, Timothy S, MD   2 puff at 08/31/24 0838   busPIRone (BUSPAR) tablet 15 mg  15 mg Oral TID Opyd, Timothy S, MD   15 mg at 09/01/24 1010   famotidine  (PEPCID ) tablet 20 mg  20 mg Oral Daily Pahwani, Ravi, MD   20 mg at 09/01/24 1010    HYDROcodone -acetaminophen  (NORCO/VICODIN) 5-325 MG per tablet 1 tablet  1 tablet Oral Q6H PRN Dibia, Pauline E, MD   1 tablet at 09/01/24 0718   hydrOXYzine  (ATARAX ) tablet 25 mg  25 mg Oral BID PRN Opyd, Timothy S, MD       latanoprost  (XALATAN ) 0.005 % ophthalmic solution 1 drop  1 drop Both Eyes Daily Pahwani, Fredia, MD       nicotine  (NICODERM CQ  - dosed in mg/24 hours) patch 21 mg  21 mg Transdermal Daily Opyd, Timothy S, MD   21 mg at 08/29/24 1119   ondansetron  (ZOFRAN ) tablet 4 mg  4 mg Oral Q6H PRN Opyd, Timothy S, MD       Or   ondansetron  (ZOFRAN ) injection 4 mg  4 mg Intravenous Q6H PRN Opyd, Timothy S, MD       pregabalin  (LYRICA ) capsule 75 mg  75 mg Oral TID Vernon Fredia, MD   75 mg at 09/01/24 1010   QUEtiapine  (SEROQUEL ) tablet 200 mg  200 mg Oral QHS Pahwani, Ravi, MD   200 mg at 08/31/24 2155   senna (SENOKOT) tablet 8.6 mg  1 tablet Oral Daily PRN Opyd, Evalene RAMAN, MD       sertraline  (ZOLOFT ) tablet 100 mg  100 mg Oral Daily Opyd, Timothy S, MD   100 mg at 09/01/24 1010   sodium chloride  flush (NS) 0.9 % injection 3 mL  3 mL Intravenous Q12H Opyd, Timothy S, MD   3 mL at 09/01/24 1104   valbenazine  (INGREZZA ) capsule 80 mg  80 mg Oral Daily Opyd, Timothy S, MD   80 mg at 09/01/24 1011     Discharge Medications:  albuterol  (2.5 MG/3ML) 0.083% nebulizer solution Commonly known as: PROVENTIL  Take 2.5 mg by nebulization every 6 (six) hours as needed for shortness of breath.    albuterol  108 (90 Base) MCG/ACT inhaler Commonly known as: VENTOLIN  HFA Inhale 1-2 puffs into the lungs every 4 (four) hours as needed for wheezing or shortness of breath (or cough).    amLODipine  5 MG tablet Commonly known as: NORVASC  Take 5 mg by mouth daily.    atorvastatin  10 MG tablet Commonly known as: LIPITOR Take 10 mg by mouth daily.    busPIRone 15 MG tablet Commonly known as: BUSPAR Take 15 mg by mouth 3 (three) times daily.    DHEA 25 MG Caps Take 25 mg by mouth in the  morning.    EPINEPHrine  0.3 mg/0.3 mL Soaj injection Commonly known as: EPI-PEN Inject 0.3 mg into the muscle as needed for anaphylaxis.    HYDROcodone -acetaminophen  10-325 MG tablet Commonly known as: NORCO Take 1 tablet by mouth See admin instructions. Give 1 tablet by mouth 5 time a day as needed    hydrOXYzine  25 MG capsule Commonly known as: VISTARIL  Take 25 mg by mouth 2 (two) times daily as needed for anxiety.  Ingrezza  80 MG capsule Generic drug: valbenazine  Take 1 capsule (80 mg total) by mouth daily.    latanoprost  0.005 % ophthalmic solution Commonly known as: XALATAN  Place 1 drop into both eyes daily.    loratadine  10 MG tablet Commonly known as: CLARITIN  Take 10 mg by mouth daily.    metoprolol  tartrate 25 MG tablet Commonly known as: LOPRESSOR  Take 1 tablet (25 mg total) by mouth 2 (two) times daily.    nicotine  21 mg/24hr patch Commonly known as: NICODERM CQ  - dosed in mg/24 hours Place 21 mg onto the skin daily.    pregabalin  75 MG capsule Commonly known as: LYRICA  Take 1 capsule (75 mg total) by mouth 3 (three) times daily.    QUEtiapine  200 MG tablet Commonly known as: SEROQUEL  Take 200 mg by mouth at bedtime.    sertraline  100 MG tablet Commonly known as: ZOLOFT  Take 100 mg by mouth daily.    Trelegy Ellipta 200-62.5-25 MCG/ACT Aepb Generic drug: Fluticasone-Umeclidin-Vilant Inhale 1 puff into the lungs daily.    triamcinolone cream 0.1 % Commonly known as: KENALOG Apply 1 g topically See admin instructions. Apply (1 grams) green pea-sized bead to popliteal fossae twice a day    Vitamin D3 250 MCG (10000 UT) Tabs Take 10,000 Units by mouth daily.    Relevant Imaging Results:  Relevant Lab Results:   Additional Information SSN [id number]  Heather DELENA Saltness, LCSW

## 2024-09-01 NOTE — Plan of Care (Signed)

## 2024-09-02 LAB — CULTURE, BLOOD (ROUTINE X 2)
Culture: NO GROWTH
Culture: NO GROWTH

## 2024-09-04 LAB — COMPLEMENT COMPONENT C1Q: C1q Complement Protein CC1Q: 12.3 mg/dL (ref 10.3–20.5)
